# Patient Record
Sex: Female | Born: 1940 | Race: White | Hispanic: No | Smoking: Former smoker
Health system: Southern US, Community
[De-identification: ages and names within clinical notes are randomized; demographics above are authoritative.]

## PROBLEM LIST (undated history)

## (undated) DIAGNOSIS — I739 Peripheral vascular disease, unspecified: Secondary | ICD-10-CM

## (undated) DIAGNOSIS — F32A Depression, unspecified: Secondary | ICD-10-CM

## (undated) DIAGNOSIS — I639 Cerebral infarction, unspecified: Secondary | ICD-10-CM

## (undated) DIAGNOSIS — I2510 Atherosclerotic heart disease of native coronary artery without angina pectoris: Secondary | ICD-10-CM

## (undated) DIAGNOSIS — M549 Dorsalgia, unspecified: Secondary | ICD-10-CM

## (undated) DIAGNOSIS — T7840XA Allergy, unspecified, initial encounter: Secondary | ICD-10-CM

## (undated) DIAGNOSIS — E079 Disorder of thyroid, unspecified: Secondary | ICD-10-CM

## (undated) DIAGNOSIS — I10 Essential (primary) hypertension: Secondary | ICD-10-CM

## (undated) DIAGNOSIS — F419 Anxiety disorder, unspecified: Secondary | ICD-10-CM

## (undated) DIAGNOSIS — D649 Anemia, unspecified: Secondary | ICD-10-CM

## (undated) DIAGNOSIS — M1990 Unspecified osteoarthritis, unspecified site: Secondary | ICD-10-CM

## (undated) HISTORY — DX: Allergy, unspecified, initial encounter: T78.40XA

## (undated) HISTORY — DX: Unspecified osteoarthritis, unspecified site: M19.90

## (undated) HISTORY — DX: Anemia, unspecified: D64.9

## (undated) HISTORY — PX: CORONARY ARTERY BYPASS GRAFT: SHX141

## (undated) HISTORY — DX: Disorder of thyroid, unspecified: E07.9

## (undated) HISTORY — PX: CARDIAC CATHETERIZATION: SHX172

## (undated) HISTORY — PX: OTHER SURGICAL HISTORY: SHX169

## (undated) HISTORY — DX: Anxiety disorder, unspecified: F41.9

## (undated) HISTORY — DX: Essential (primary) hypertension: I10

## (undated) HISTORY — DX: Dorsalgia, unspecified: M54.9

## (undated) HISTORY — PX: CATARACT EXTRACTION: SUR2

## (undated) HISTORY — PX: CORONARY ANGIOPLASTY: SHX604

---

## 2020-10-02 DIAGNOSIS — F32A Depression, unspecified: Secondary | ICD-10-CM | POA: Diagnosis present

## 2020-10-02 DIAGNOSIS — Z8673 Personal history of transient ischemic attack (TIA), and cerebral infarction without residual deficits: Secondary | ICD-10-CM

## 2020-10-02 DIAGNOSIS — Z87891 Personal history of nicotine dependence: Secondary | ICD-10-CM | POA: Insufficient documentation

## 2020-10-02 DIAGNOSIS — I2510 Atherosclerotic heart disease of native coronary artery without angina pectoris: Secondary | ICD-10-CM

## 2020-10-02 DIAGNOSIS — E785 Hyperlipidemia, unspecified: Secondary | ICD-10-CM | POA: Insufficient documentation

## 2020-10-02 DIAGNOSIS — E039 Hypothyroidism, unspecified: Secondary | ICD-10-CM | POA: Diagnosis present

## 2020-10-02 DIAGNOSIS — Z951 Presence of aortocoronary bypass graft: Secondary | ICD-10-CM | POA: Insufficient documentation

## 2020-10-02 DIAGNOSIS — D649 Anemia, unspecified: Secondary | ICD-10-CM | POA: Insufficient documentation

## 2020-10-02 DIAGNOSIS — G8929 Other chronic pain: Secondary | ICD-10-CM | POA: Insufficient documentation

## 2020-10-02 DIAGNOSIS — Z7901 Long term (current) use of anticoagulants: Secondary | ICD-10-CM | POA: Insufficient documentation

## 2020-10-02 DIAGNOSIS — M549 Dorsalgia, unspecified: Secondary | ICD-10-CM | POA: Insufficient documentation

## 2020-10-02 DIAGNOSIS — Z955 Presence of coronary angioplasty implant and graft: Secondary | ICD-10-CM | POA: Insufficient documentation

## 2020-10-02 DIAGNOSIS — E539 Vitamin B deficiency, unspecified: Secondary | ICD-10-CM | POA: Insufficient documentation

## 2020-10-02 HISTORY — DX: Personal history of transient ischemic attack (TIA), and cerebral infarction without residual deficits: Z86.73

## 2020-10-02 HISTORY — DX: Personal history of nicotine dependence: Z87.891

## 2020-10-06 ENCOUNTER — Emergency Department (HOSPITAL_COMMUNITY)
Admission: EM | Admit: 2020-10-06 | Discharge: 2020-10-06 | Disposition: A | Discharge status: Home / Self Care | Payer: Medicare Other | Source: Home / Self Care | Attending: Emergency Medicine | Admitting: Emergency Medicine

## 2020-10-06 ENCOUNTER — Other Ambulatory Visit: Payer: Self-pay

## 2020-10-06 ENCOUNTER — Inpatient Hospital Stay (HOSPITAL_COMMUNITY)
Admission: EM | Admit: 2020-10-06 | Discharge: 2020-10-12 | DRG: 091 | Disposition: A | Discharge status: Skilled Nursing Facility | Payer: Medicare Other | Attending: Internal Medicine | Admitting: Internal Medicine

## 2020-10-06 ENCOUNTER — Observation Stay (HOSPITAL_COMMUNITY): Payer: Medicare Other

## 2020-10-06 ENCOUNTER — Emergency Department (HOSPITAL_COMMUNITY): Payer: Medicare Other

## 2020-10-06 ENCOUNTER — Encounter (HOSPITAL_COMMUNITY): Payer: Self-pay | Admitting: Internal Medicine

## 2020-10-06 DIAGNOSIS — W19XXXA Unspecified fall, initial encounter: Secondary | ICD-10-CM

## 2020-10-06 DIAGNOSIS — R4189 Other symptoms and signs involving cognitive functions and awareness: Secondary | ICD-10-CM | POA: Diagnosis present

## 2020-10-06 DIAGNOSIS — R059 Cough, unspecified: Secondary | ICD-10-CM

## 2020-10-06 DIAGNOSIS — E039 Hypothyroidism, unspecified: Secondary | ICD-10-CM | POA: Diagnosis present

## 2020-10-06 DIAGNOSIS — I10 Essential (primary) hypertension: Secondary | ICD-10-CM | POA: Diagnosis present

## 2020-10-06 DIAGNOSIS — R410 Disorientation, unspecified: Principal | ICD-10-CM

## 2020-10-06 DIAGNOSIS — E871 Hypo-osmolality and hyponatremia: Secondary | ICD-10-CM | POA: Diagnosis present

## 2020-10-06 DIAGNOSIS — G9340 Encephalopathy, unspecified: Secondary | ICD-10-CM | POA: Diagnosis not present

## 2020-10-06 DIAGNOSIS — F32A Depression, unspecified: Secondary | ICD-10-CM | POA: Diagnosis present

## 2020-10-06 DIAGNOSIS — Z951 Presence of aortocoronary bypass graft: Secondary | ICD-10-CM

## 2020-10-06 DIAGNOSIS — R52 Pain, unspecified: Secondary | ICD-10-CM

## 2020-10-06 DIAGNOSIS — T402X5A Adverse effect of other opioids, initial encounter: Secondary | ICD-10-CM | POA: Diagnosis present

## 2020-10-06 DIAGNOSIS — E785 Hyperlipidemia, unspecified: Secondary | ICD-10-CM | POA: Diagnosis present

## 2020-10-06 DIAGNOSIS — K5900 Constipation, unspecified: Secondary | ICD-10-CM | POA: Diagnosis present

## 2020-10-06 DIAGNOSIS — T424X5A Adverse effect of benzodiazepines, initial encounter: Secondary | ICD-10-CM | POA: Diagnosis present

## 2020-10-06 DIAGNOSIS — I639 Cerebral infarction, unspecified: Secondary | ICD-10-CM | POA: Diagnosis present

## 2020-10-06 DIAGNOSIS — Z803 Family history of malignant neoplasm of breast: Secondary | ICD-10-CM

## 2020-10-06 DIAGNOSIS — Z7901 Long term (current) use of anticoagulants: Secondary | ICD-10-CM

## 2020-10-06 DIAGNOSIS — Z9861 Coronary angioplasty status: Secondary | ICD-10-CM

## 2020-10-06 DIAGNOSIS — G9341 Metabolic encephalopathy: Secondary | ICD-10-CM | POA: Diagnosis present

## 2020-10-06 DIAGNOSIS — R296 Repeated falls: Secondary | ICD-10-CM | POA: Diagnosis present

## 2020-10-06 DIAGNOSIS — I739 Peripheral vascular disease, unspecified: Secondary | ICD-10-CM | POA: Diagnosis present

## 2020-10-06 DIAGNOSIS — T50905A Adverse effect of unspecified drugs, medicaments and biological substances, initial encounter: Secondary | ICD-10-CM

## 2020-10-06 DIAGNOSIS — G928 Other toxic encephalopathy: Principal | ICD-10-CM | POA: Diagnosis present

## 2020-10-06 DIAGNOSIS — I4892 Unspecified atrial flutter: Secondary | ICD-10-CM | POA: Diagnosis present

## 2020-10-06 DIAGNOSIS — R627 Adult failure to thrive: Secondary | ICD-10-CM | POA: Diagnosis present

## 2020-10-06 DIAGNOSIS — Z20822 Contact with and (suspected) exposure to covid-19: Secondary | ICD-10-CM | POA: Diagnosis present

## 2020-10-06 DIAGNOSIS — Z79890 Hormone replacement therapy (postmenopausal): Secondary | ICD-10-CM

## 2020-10-06 DIAGNOSIS — Z87891 Personal history of nicotine dependence: Secondary | ICD-10-CM

## 2020-10-06 DIAGNOSIS — R531 Weakness: Secondary | ICD-10-CM | POA: Diagnosis present

## 2020-10-06 DIAGNOSIS — I480 Paroxysmal atrial fibrillation: Secondary | ICD-10-CM | POA: Diagnosis present

## 2020-10-06 DIAGNOSIS — Z7982 Long term (current) use of aspirin: Secondary | ICD-10-CM

## 2020-10-06 DIAGNOSIS — Z79899 Other long term (current) drug therapy: Secondary | ICD-10-CM

## 2020-10-06 DIAGNOSIS — Z8673 Personal history of transient ischemic attack (TIA), and cerebral infarction without residual deficits: Secondary | ICD-10-CM

## 2020-10-06 DIAGNOSIS — F419 Anxiety disorder, unspecified: Secondary | ICD-10-CM | POA: Diagnosis present

## 2020-10-06 DIAGNOSIS — Z9181 History of falling: Secondary | ICD-10-CM

## 2020-10-06 DIAGNOSIS — R7303 Prediabetes: Secondary | ICD-10-CM | POA: Diagnosis present

## 2020-10-06 DIAGNOSIS — I2510 Atherosclerotic heart disease of native coronary artery without angina pectoris: Secondary | ICD-10-CM

## 2020-10-06 DIAGNOSIS — F319 Bipolar disorder, unspecified: Secondary | ICD-10-CM | POA: Diagnosis present

## 2020-10-06 DIAGNOSIS — R946 Abnormal results of thyroid function studies: Secondary | ICD-10-CM | POA: Diagnosis present

## 2020-10-06 DIAGNOSIS — T381X6A Underdosing of thyroid hormones and substitutes, initial encounter: Secondary | ICD-10-CM | POA: Diagnosis present

## 2020-10-06 DIAGNOSIS — Z8249 Family history of ischemic heart disease and other diseases of the circulatory system: Secondary | ICD-10-CM

## 2020-10-06 DIAGNOSIS — Z91138 Patient's unintentional underdosing of medication regimen for other reason: Secondary | ICD-10-CM

## 2020-10-06 DIAGNOSIS — R739 Hyperglycemia, unspecified: Secondary | ICD-10-CM | POA: Diagnosis present

## 2020-10-06 DIAGNOSIS — R58 Hemorrhage, not elsewhere classified: Secondary | ICD-10-CM | POA: Insufficient documentation

## 2020-10-06 HISTORY — DX: Peripheral vascular disease, unspecified: I73.9

## 2020-10-06 HISTORY — DX: Depression, unspecified: F32.A

## 2020-10-06 HISTORY — DX: Atherosclerotic heart disease of native coronary artery without angina pectoris: I25.10

## 2020-10-06 HISTORY — DX: Cerebral infarction, unspecified: I63.9

## 2020-10-06 LAB — COMPREHENSIVE METABOLIC PANEL
ALT: 14 U/L (ref 0–44)
AST: 17 U/L (ref 15–41)
Albumin: 3.8 g/dL (ref 3.5–5.0)
Alkaline Phosphatase: 76 U/L (ref 38–126)
Anion gap: 10 (ref 5–15)
BUN: 8 mg/dL (ref 8–23)
CO2: 20 mmol/L — ABNORMAL LOW (ref 22–32)
Calcium: 9.5 mg/dL (ref 8.9–10.3)
Chloride: 105 mmol/L (ref 98–111)
Creatinine, Ser: 0.82 mg/dL (ref 0.44–1.00)
GFR, Estimated: >60 mL/min (ref >60)
Glucose, Bld: 103 mg/dL — ABNORMAL HIGH (ref 70–99)
Potassium: 4.5 mmol/L (ref 3.5–5.1)
Sodium: 135 mmol/L (ref 135–145)
Total Bilirubin: 1.1 mg/dL (ref 0.3–1.2)
Total Protein: 7.3 g/dL (ref 6.5–8.1)

## 2020-10-06 LAB — DIFFERENTIAL
Abs Immature Granulocytes: 0.06 10*3/uL (ref 0.00–0.07)
Basophils Absolute: 0.1 10*3/uL (ref 0.0–0.1)
Basophils Relative: 1 %
Eosinophils Absolute: 0.3 10*3/uL (ref 0.0–0.5)
Eosinophils Relative: 3 %
Immature Granulocytes: 1 %
Lymphocytes Relative: 17 %
Lymphs Abs: 1.7 10*3/uL (ref 0.7–4.0)
Monocytes Absolute: 0.9 10*3/uL (ref 0.1–1.0)
Monocytes Relative: 9 %
Neutro Abs: 6.8 10*3/uL (ref 1.7–7.7)
Neutrophils Relative %: 69 %

## 2020-10-06 LAB — LITHIUM LEVEL: Lithium Lvl: 0.9 mmol/L (ref 0.60–1.20)

## 2020-10-06 LAB — I-STAT CHEM 8, ED
BUN: 8 mg/dL (ref 8–23)
Calcium, Ion: 1.25 mmol/L (ref 1.15–1.40)
Chloride: 105 mmol/L (ref 98–111)
Creatinine, Ser: 0.7 mg/dL (ref 0.44–1.00)
Glucose, Bld: 99 mg/dL (ref 70–99)
HCT: 39 % (ref 36.0–46.0)
Hemoglobin: 13.3 g/dL (ref 12.0–15.0)
Potassium: 4.6 mmol/L (ref 3.5–5.1)
Sodium: 138 mmol/L (ref 135–145)
TCO2: 23 mmol/L (ref 22–32)

## 2020-10-06 LAB — CBC
HCT: 39.5 % (ref 36.0–46.0)
Hemoglobin: 12.3 g/dL (ref 12.0–15.0)
MCH: 28.3 pg (ref 26.0–34.0)
MCHC: 31.1 g/dL (ref 30.0–36.0)
MCV: 91 fL (ref 80.0–100.0)
Platelets: 288 10*3/uL (ref 150–400)
RBC: 4.34 MIL/uL (ref 3.87–5.11)
RDW: 16 % — ABNORMAL HIGH (ref 11.5–15.5)
WBC: 9.8 10*3/uL (ref 4.0–10.5)
nRBC: 0 % (ref 0.0–0.2)

## 2020-10-06 LAB — HEPATIC FUNCTION PANEL
ALT: 14 U/L (ref 0–44)
AST: 15 U/L (ref 15–41)
Albumin: 3.3 g/dL — ABNORMAL LOW (ref 3.5–5.0)
Alkaline Phosphatase: 68 U/L (ref 38–126)
Bilirubin, Direct: 0.1 mg/dL (ref 0.0–0.2)
Indirect Bilirubin: 0.7 mg/dL (ref 0.3–0.9)
Total Bilirubin: 0.8 mg/dL (ref 0.3–1.2)
Total Protein: 6.1 g/dL — ABNORMAL LOW (ref 6.5–8.1)

## 2020-10-06 LAB — CBG MONITORING, ED: Glucose-Capillary: 93 mg/dL (ref 70–99)

## 2020-10-06 LAB — CBC WITH DIFFERENTIAL/PLATELET
Abs Immature Granulocytes: 0.04 10*3/uL (ref 0.00–0.07)
Basophils Absolute: 0.1 10*3/uL (ref 0.0–0.1)
Basophils Relative: 1 %
Eosinophils Absolute: 0.3 10*3/uL (ref 0.0–0.5)
Eosinophils Relative: 3 %
HCT: 32.4 % — ABNORMAL LOW (ref 36.0–46.0)
Hemoglobin: 10.8 g/dL — ABNORMAL LOW (ref 12.0–15.0)
Immature Granulocytes: 0 %
Lymphocytes Relative: 19 %
Lymphs Abs: 1.8 10*3/uL (ref 0.7–4.0)
MCH: 29.6 pg (ref 26.0–34.0)
MCHC: 33.3 g/dL (ref 30.0–36.0)
MCV: 88.8 fL (ref 80.0–100.0)
Monocytes Absolute: 1 10*3/uL (ref 0.1–1.0)
Monocytes Relative: 11 %
Neutro Abs: 6.1 10*3/uL (ref 1.7–7.7)
Neutrophils Relative %: 66 %
Platelets: 256 10*3/uL (ref 150–400)
RBC: 3.65 MIL/uL — ABNORMAL LOW (ref 3.87–5.11)
RDW: 15.9 % — ABNORMAL HIGH (ref 11.5–15.5)
WBC: 9.2 10*3/uL (ref 4.0–10.5)
nRBC: 0 % (ref 0.0–0.2)

## 2020-10-06 LAB — BASIC METABOLIC PANEL
Anion gap: 8 (ref 5–15)
BUN: 9 mg/dL (ref 8–23)
CO2: 21 mmol/L — ABNORMAL LOW (ref 22–32)
Calcium: 8.4 mg/dL — ABNORMAL LOW (ref 8.9–10.3)
Chloride: 99 mmol/L (ref 98–111)
Creatinine, Ser: 0.74 mg/dL (ref 0.44–1.00)
GFR, Estimated: >60 mL/min (ref >60)
Glucose, Bld: 135 mg/dL — ABNORMAL HIGH (ref 70–99)
Potassium: 3.6 mmol/L (ref 3.5–5.1)
Sodium: 128 mmol/L — ABNORMAL LOW (ref 135–145)

## 2020-10-06 LAB — RESP PANEL BY RT-PCR (FLU A&B, COVID) ARPGX2
Influenza A by PCR: NEGATIVE
Influenza B by PCR: NEGATIVE
SARS Coronavirus 2 by RT PCR: NEGATIVE

## 2020-10-06 LAB — PROTIME-INR
INR: 1.2 (ref 0.8–1.2)
Prothrombin Time: 14.6 seconds (ref 11.4–15.2)

## 2020-10-06 LAB — TSH: TSH: 10.765 u[IU]/mL — ABNORMAL HIGH (ref 0.350–4.500)

## 2020-10-06 LAB — APTT: aPTT: 28 seconds (ref 24–36)

## 2020-10-06 LAB — AMMONIA: Ammonia: 19 umol/L (ref 9–35)

## 2020-10-06 MED ORDER — LORAZEPAM 2 MG/ML IJ SOLN
1.0000 mg | Freq: Once | INTRAMUSCULAR | Status: AC
  Administered 2020-10-06: 1 mg via INTRAVENOUS
  Filled 2020-10-06: qty 1

## 2020-10-06 MED ORDER — LACTATED RINGERS IV BOLUS: 1000.0000 mL | Freq: Once | INTRAVENOUS | Status: DC

## 2020-10-06 MED ORDER — LEVOTHYROXINE SODIUM 112 MCG PO TABS
112.0000 ug | ORAL_TABLET | Freq: Every day | ORAL | Status: DC
  Administered 2020-10-07: 112 ug via ORAL
  Filled 2020-10-06: qty 1

## 2020-10-06 MED ORDER — DOCUSATE SODIUM 100 MG PO CAPS: 300.0000 mg | ORAL_CAPSULE | Freq: Every day | ORAL | Status: DC | PRN

## 2020-10-06 MED ORDER — CILOSTAZOL 100 MG PO TABS
100.0000 mg | ORAL_TABLET | Freq: Two times a day (BID) | ORAL | Status: DC
  Administered 2020-10-07 – 2020-10-12 (×12): 100 mg via ORAL
  Filled 2020-10-06 (×14): qty 1

## 2020-10-06 MED ORDER — CLONAZEPAM 0.5 MG PO TABS
0.5000 mg | ORAL_TABLET | Freq: Every day | ORAL | Status: DC
  Administered 2020-10-07 – 2020-10-09 (×3): 0.5 mg via ORAL
  Filled 2020-10-06 (×4): qty 1

## 2020-10-06 MED ORDER — ASPIRIN EC 81 MG PO TBEC
81.0000 mg | DELAYED_RELEASE_TABLET | Freq: Every day | ORAL | Status: DC
  Filled 2020-10-06: qty 1

## 2020-10-06 MED ORDER — METOPROLOL SUCCINATE ER 100 MG PO TB24
100.0000 mg | ORAL_TABLET | Freq: Every day | ORAL | Status: DC
  Administered 2020-10-07 – 2020-10-11 (×6): 100 mg via ORAL
  Filled 2020-10-06 (×6): qty 1

## 2020-10-06 MED ORDER — SODIUM CHLORIDE 0.9% FLUSH: 3.0000 mL | Freq: Once | INTRAVENOUS | Status: DC

## 2020-10-06 MED ORDER — APIXABAN 2.5 MG PO TABS
2.5000 mg | ORAL_TABLET | Freq: Two times a day (BID) | ORAL | Status: DC
  Administered 2020-10-07: 2.5 mg via ORAL
  Filled 2020-10-06: qty 1

## 2020-10-06 MED ORDER — BENAZEPRIL HCL 20 MG PO TABS
20.0000 mg | ORAL_TABLET | Freq: Every day | ORAL | Status: DC
  Administered 2020-10-07 – 2020-10-12 (×6): 20 mg via ORAL
  Filled 2020-10-06 (×6): qty 1

## 2020-10-06 MED ORDER — AMLODIPINE BESYLATE 10 MG PO TABS
10.0000 mg | ORAL_TABLET | Freq: Every day | ORAL | Status: DC
  Administered 2020-10-07 – 2020-10-12 (×6): 10 mg via ORAL
  Filled 2020-10-06 (×6): qty 1

## 2020-10-06 MED ORDER — AMLODIPINE BESY-BENAZEPRIL HCL 10-20 MG PO CAPS: 1.0000 | ORAL_CAPSULE | Freq: Every day | ORAL | Status: DC

## 2020-10-06 MED ORDER — LITHIUM CARBONATE 300 MG PO CAPS
300.0000 mg | ORAL_CAPSULE | Freq: Every day | ORAL | Status: DC
  Administered 2020-10-07 – 2020-10-11 (×6): 300 mg via ORAL
  Filled 2020-10-06 (×7): qty 1

## 2020-10-06 MED ORDER — HYDROCODONE-ACETAMINOPHEN 5-325 MG PO TABS
1.0000 | ORAL_TABLET | Freq: Four times a day (QID) | ORAL | Status: DC | PRN
  Administered 2020-10-07: 1 via ORAL
  Filled 2020-10-06: qty 1

## 2020-10-06 MED ORDER — ATORVASTATIN CALCIUM 10 MG PO TABS
20.0000 mg | ORAL_TABLET | Freq: Every day | ORAL | Status: DC
  Administered 2020-10-07 – 2020-10-11 (×6): 20 mg via ORAL
  Filled 2020-10-06 (×6): qty 2

## 2020-10-06 MED ORDER — LACTATED RINGERS IV BOLUS
1000.0000 mL | Freq: Once | INTRAVENOUS | Status: AC
  Administered 2020-10-06: 1000 mL via INTRAVENOUS

## 2020-10-06 NOTE — H&P (Addendum)
History and Physical    Carmen [name] QQI:[id number] DOB: January 28, 1941 DOA: 10/06/2020  PCP: Patient, No Pcp Per  Patient coming from: Home.  History obtained from patient's daughter.  Chief Complaint: Confusion and frequent falls.  HPI: Carmen [name] is a 80 y.o. female with history of stroke in September 2021 when patient was in [place] subsequently which patient moved to her daughter's house in [place] and was found to have increasing falls over the last couple of weeks.  Patient's daughter in [place] brought her over here to take care of the patient.  Is not sure if patient was taking her home medications in the right way when patient was in [facility] because she took it herself.  Over the last 4 days patient's daughter in [place] was taking care of her and giving the medications.  Over the last 24 hours patient has become more confused and was finding it difficult to ambulate.  Was brought to the ER.  Did not have any chest pain productive cough fever chills nausea vomiting or diarrhea.  Had some constipation 5 days ago which improved with stool softeners.  ED Course: In the ER patient is alert and awake oriented to her name and time.  Moving all extremities.  MRI brain does not show anything acute.  Monitor does show atrial flutter rate controlled at around 80 bpm.  Initial labs show sodium of 128.  Hemoglobin of 10.  Lithium levels are normal.  Salicylates and Tylenol levels are pending.  TSH is around 10 and patient's daughter states that not sure if she was taking her Synthroid right away.  Patient admitted for generalized weakness change in mental status and frequent falls.  Review of Systems: As per HPI, rest all negative.   Past Medical History:  Diagnosis Date  . Coronary artery disease   . Depression   . Peripheral vascular disease (HCC)   . Stroke [facility])     Past Surgical History:  Procedure Laterality Date  . CARDIAC CATHETERIZATION    .  CORONARY ANGIOPLASTY    . CORONARY ARTERY BYPASS GRAFT       reports that she has been smoking. She has never used smokeless tobacco. She reports previous alcohol use. No history on file for drug use.  No Known Allergies  Family History  Problem Relation Age of Onset  . CAD Mother   . Breast cancer Mother   . Bipolar disorder Daughter   . Diabetes Mellitus II Neg Hx     Prior to Admission medications   Medication Sig Start Date End Date Taking? Authorizing Provider  amLODipine-benazepril (LOTREL) 10-20 MG capsule Take 1 capsule by mouth daily.   Yes [provider]  apixaban (ELIQUIS) 5 MG TABS tablet Take 2.5 mg by mouth 2 (two) times daily.   Yes [provider]  aspirin EC 81 MG tablet Take 40.5 mg by mouth daily as needed for moderate pain. Swallow whole.   Yes [provider]  atorvastatin (LIPITOR) 20 MG tablet Take 20 mg by mouth at bedtime.   Yes [provider]  cilostazol (PLETAL) 100 MG tablet Take 100 mg by mouth 2 (two) times daily.   Yes [provider]  clonazePAM (KLONOPIN) 0.5 MG tablet Take 0.5 mg by mouth daily.   Yes [provider]  docusate sodium (COLACE) 100 MG capsule Take 300 mg by mouth daily as needed for mild constipation.   Yes [provider]  HYDROcodone-acetaminophen (NORCO) 10-325 MG  tablet Take 1 tablet by mouth 3 (three) times daily as needed for moderate pain.   Yes [provider]  levothyroxine (SYNTHROID) 112 MCG tablet Take 112 mcg by mouth daily before breakfast.   Yes [provider]  lithium carbonate 300 MG capsule Take 300 mg by mouth at bedtime.   Yes [provider]  metoprolol succinate (TOPROL-XL) 100 MG 24 hr tablet Take 100 mg by mouth at bedtime. Take with or immediately following a meal.   Yes [provider]    Physical Exam: Constitutional: Moderately built and nourished. Vitals:   10/06/20 2100 10/06/20 2115 10/06/20 2200  10/06/20 2215  BP: 139/76 (!) 137/97 138/72 115/65  Pulse: 82 82 71 68  Resp: 12 16 17 17   Temp:      TempSrc:      SpO2: 99% 99% 100% 96%  Weight:      Height:       Eyes: Anicteric no pallor. ENMT: No discharge from the ears eyes nose or mouth. Neck: No mass felt.  No neck rigidity. Respiratory: No rhonchi or crepitations. Cardiovascular: S1-S2 heard. Abdomen: Soft nontender bowel sounds present. Musculoskeletal: Multiple bruises particular on the right upper extremity. Skin: Multiple bruises. Neurologic: Alert awake oriented to name and time moving all extremities.  Pupils equal and reacting to light. Psychiatric: Oriented to her name and time.   Labs on Admission: I have personally reviewed following labs and imaging studies  CBC: Recent Labs  Lab 10/06/20 2202  WBC 9.2  NEUTROABS 6.1  HGB 10.8*  HCT 32.4*  MCV 88.8  PLT 256   Basic Metabolic Panel: Recent Labs  Lab 10/06/20 2202  NA 128*  K 3.6  CL 99  CO2 21*  GLUCOSE 135*  BUN 9  CREATININE 0.74  CALCIUM 8.4*   GFR: Estimated Creatinine Clearance: 62 mL/min (by C-G formula based on SCr of 0.74 mg/dL). Liver Function Tests: Recent Labs  Lab 10/06/20 2202  AST 15  ALT 14  ALKPHOS 68  BILITOT 0.8  PROT 6.1*  ALBUMIN 3.3*   No results for input(s): LIPASE, AMYLASE in the last 168 hours. Recent Labs  Lab 10/06/20 1651  AMMONIA 19   Coagulation Profile: No results for input(s): INR, PROTIME in the last 168 hours. Cardiac Enzymes: No results for input(s): CKTOTAL, CKMB, CKMBINDEX, TROPONINI in the last 168 hours. BNP (last 3 results) No results for input(s): PROBNP in the last 8760 hours. HbA1C: No results for input(s): HGBA1C in the last 72 hours. CBG: No results for input(s): GLUCAP in the last 168 hours. Lipid Profile: No results for input(s): CHOL, HDL, LDLCALC, TRIG, CHOLHDL, LDLDIRECT in the last 72 hours. Thyroid Function Tests: Recent Labs    10/06/20 1651  TSH 10.765*    Anemia Panel: No results for input(s): VITAMINB12, FOLATE, FERRITIN, TIBC, IRON, RETICCTPCT in the last 72 hours. Urine analysis: No results found for: COLORURINE, APPEARANCEUR, LABSPEC, PHURINE, GLUCOSEU, HGBUR, BILIRUBINUR, KETONESUR, PROTEINUR, UROBILINOGEN, NITRITE, LEUKOCYTESUR Sepsis Labs: @LABRCNTIP (procalcitonin:4,lacticidven:4) ) Recent Results (from the past 240 hour(s))  Resp Panel by RT-PCR (Flu A&B, Covid) Nasopharyngeal Swab     Status: None   Collection Time: 10/06/20  4:52 PM   Specimen: Nasopharyngeal Swab; Nasopharyngeal(NP) swabs in vial transport medium  Result Value Ref Range Status   SARS Coronavirus 2 by RT PCR NEGATIVE NEGATIVE Final    Comment: (NOTE) SARS-CoV-2 target nucleic acids are NOT DETECTED.  The SARS-CoV-2 RNA is generally detectable in upper respiratory specimens during the acute phase  of infection. The lowest concentration of SARS-CoV-2 viral copies this assay can detect is 138 copies/mL. A negative result does not preclude SARS-Cov-2 infection and should not be used as the sole basis for treatment or other patient management decisions. A negative result may occur with  improper specimen collection/handling, submission of specimen other than nasopharyngeal swab, presence of viral mutation(s) within the areas targeted by this assay, and inadequate number of viral copies(<138 copies/mL). A negative result must be combined with clinical observations, patient history, and epidemiological information. The expected result is Negative.  Fact Sheet for Patients:  BloggerCourse.com  Fact Sheet for Healthcare Providers:  SeriousBroker.it  This test is no t yet approved or cleared by the [place] FDA and  has been authorized for detection and/or diagnosis of SARS-CoV-2 by FDA under an Emergency Use Authorization (EUA). This EUA will remain  in effect (meaning this test can be used) for the  duration of the COVID-19 declaration under Section 564(b)(1) of the Act, 21 U.S.C.section 360bbb-3(b)(1), unless the authorization is terminated  or revoked sooner.       Influenza A by PCR NEGATIVE NEGATIVE Final   Influenza B by PCR NEGATIVE NEGATIVE Final    Comment: (NOTE) The Xpert Xpress SARS-CoV-2/FLU/RSV plus assay is intended as an aid in the diagnosis of influenza from Nasopharyngeal swab specimens and should not be used as a sole basis for treatment. Nasal washings and aspirates are unacceptable for Xpert Xpress SARS-CoV-2/FLU/RSV testing.  Fact Sheet for Patients: BloggerCourse.com  Fact Sheet for Healthcare Providers: SeriousBroker.it  This test is not yet approved or cleared by the [place] FDA and has been authorized for detection and/or diagnosis of SARS-CoV-2 by FDA under an Emergency Use Authorization (EUA). This EUA will remain in effect (meaning this test can be used) for the duration of the COVID-19 declaration under Section 564(b)(1) of the Act, 21 U.S.C. section 360bbb-3(b)(1), unless the authorization is terminated or revoked.  Performed at [facility], [address] [place], [place] [address]      Radiological Exams on Admission: MR BRAIN WO CONTRAST  Result Date: 10/06/2020 CLINICAL DATA:  Initial evaluation for acute dizziness, mental status change. EXAM: MRI HEAD WITHOUT CONTRAST TECHNIQUE: Multiplanar, multiecho pulse sequences of the brain and surrounding structures were obtained without intravenous contrast. COMPARISON:  Level none available. FINDINGS: Brain: Examination severely limited as the patient was unable to tolerate the full length of the exam. Axial coronal DWI sequences only were performed. Additionally, images provided are markedly degraded by motion artifact. Diffusion-weighted imaging demonstrates no evidence for acute or subacute ischemia. Areas of encephalomalacia  involving the parasagittal right frontal lobe and left cerebellum likely reflect remote infarcts, incompletely assessed on this limited exam. No definite mass lesion, mass effect, or midline shift. No hydrocephalus or visible extra-axial fluid collection. Vascular: Not assessed on this technically limited exam. Skull and upper cervical spine: Not assessed on this technically limited exam. Sinuses/Orbits: Not assessed on this technically limited exam. Other: Negative. IMPRESSION: 1. Technically limited exam due to the patient's inability to tolerate the full length of the study as well as extensive motion artifact. Diffusion-weighted sequences only were performed. No evidence for acute or subacute ischemia. 2. No other definite acute intracranial abnormality. 3. 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 Electronically Signed   By: Rise Mu M.D.   On: 10/06/2020 19:11     Assessment/Plan Principal Problem:   Acute encephalopathy Active Problems:  CVA (cerebral vascular accident) ([place])   Atrial flutter ([place])   Depression   PVD (peripheral vascular disease) ([place])   Hypothyroidism   CAD (coronary artery disease)    1. Acute encephalopathy -MRI brain does not show anything acute.  Ammonia levels and lithium levels are normal.  Will check B12 and RPR EEG.  Could be from dehydration sodium was around 128.  Did receive fluid bolus in the ER.  Check metabolic panel. 2. Frequent falls could be from disequilibrium.  Get physical therapy consult.  Follow B12 levels.  CT C-spine pending. 3. Multiple bruises particular of the right upper extremity x-rays of the right upper extremity is pending. 4. Atrial flutter seen in the monitor rate controlled presently on apixaban and beta-blockers.  EKG is pending. 5. Hypothyroidism TSH is elevated at 10.  Not sure if was taking her medications as advised.  Presently on Synthroid. 6. History of  depression and anxiety on lithium and Klonopin.  Patient's daughter states she has been taking his medicines for many years. 7. History of peripheral vascular disease on Pletal. 8. History of stroke in September 2021.  Presently on apixaban. 9. Hypertension on benazepril amlodipine and beta-blockers.  Tylenol level salicylate levels are pending.  X-ray of the right upper extremity and CT C-spine are pending.   DVT prophylaxis: Apixaban. Code Status: Full code confirmed with patient's daughter. Family Communication: Patient's daughter. Disposition Plan: May need placement. Consults called: Physical therapy and social work. Admission status: Observation.   Rise Patience MD [facility] Pager [phone].  If 7PM-7AM, please contact night-coverage [email or web address] Password [facility]  10/06/2020, 10:54 PM

## 2020-10-06 NOTE — ED Triage Notes (Signed)
Arrived via EMS; from home. Reported stroke and sept, and is being care for by daughter at home. Reported non-compliant w/ meds and has some confusion this AM, lethargic and staring into space. EMS denies unilateral deficit. Patient stated she fell, unable to give details. RUE bruised and swelling,

## 2020-10-06 NOTE — ED Provider Notes (Signed)
[facility] EMERGENCY DEPARTMENT Provider Note   CSN: [id number] Arrival date & time: 10/06/20  1136     History Chief Complaint  Patient presents with   Altered Mental Status    Carmen [name] is a 80 y.o. female.  Patient is a 80 year old female with a prior history of hypertension, hyperlipidemia, prior stroke who is presenting today with her daughter due to confusion.  Patient's prior history is she had a stroke in September 2021 while she was living in [place] and after that she moved in with her daughter in [place].  Her daughter and son-in-law were just allowing her to take her own medications but she was needing more care than that which they did not feel that they were able to provide so her daughter who lives in [place] went and picked her up this week and drove her back to [place] to live with her.  Prior to her arrival she is not clear how her mom was taking the medications but since she has been with her the last 5 days she has been giving her her medications as prescribed.  She does report prior to leaving her sister's house last week her mom had 5 falls that they did not seek medical attention for.  She reported she did hit her head and she was feeling dizzy but daughter reported that she seemed her normal self.  They arrived in [place] on Sunday and she was at her baseline walking talking able to make herself something to eat if she needed it and going to the bathroom without assistance.  Since yesterday she reports that her mother has become increasingly confused and stopped eating and drinking.  She had to force feed her last night and she has been reluctant to take the medications but she has not gotten them all in her.  Today she was unable to stand on her own or walk to the bathroom which is a big change in things that she would ordinarily know she does not.  Patient reports she still having some pain in her arms and buttocks due to the  falls last week.  Daughter reports that in the last 5 days she has had a cough but has denied feeling short of breath and has not appeared winded.  She has not had any fever and denied any urinary symptoms.  She has not had a bowel movement for about 5 days and had been taking significant amount of stool softeners.  Daughter reports that last night she was complaining of abdominal pain however she denies any abdominal pain currently.  The history is provided by the patient and a caregiver.  Altered Mental Status Presenting symptoms: confusion        No past medical history on file.  There are no problems to display for this patient.    The histories are not reviewed yet. Please review them in the "History" navigator section and refresh this SmartLink.   OB History   No obstetric history on file.     No family history on file.     Home Medications Prior to Admission medications   Not on File    Allergies    Patient has no known allergies.  Review of Systems   Review of Systems  Psychiatric/Behavioral: Positive for confusion.  All other systems reviewed and are negative.   Physical Exam Updated Vital Signs BP 131/77 (BP Location: Right Arm)    Pulse 80  Temp 97.7 F (36.5 C) (Oral)    Resp 17    SpO2 98%   Physical Exam Vitals and nursing note reviewed.  Constitutional:      General: She is not in acute distress.    Appearance: Normal appearance. She is well-developed. She is obese.  HENT:     Head: Normocephalic and atraumatic.     Nose: Nose normal.     Mouth/Throat:     Mouth: Mucous membranes are moist.  Eyes:     Extraocular Movements: Extraocular movements intact.     Conjunctiva/sclera: Conjunctivae normal.     Pupils: Pupils are equal, round, and reactive to light.  Cardiovascular:     Rate and Rhythm: Normal rate and regular rhythm.     Pulses: Normal pulses.     Heart sounds: Normal heart sounds. No murmur heard. No friction rub.  Pulmonary:      Effort: Pulmonary effort is normal.     Breath sounds: Normal breath sounds. No wheezing or rales.  Abdominal:     General: Bowel sounds are normal. There is no distension.     Palpations: Abdomen is soft.     Tenderness: There is no abdominal tenderness. There is no guarding or rebound.  Musculoskeletal:        General: No tenderness. Normal range of motion.     Comments: No edema  Skin:    General: Skin is warm and dry.     Capillary Refill: Capillary refill takes less than 2 seconds.     Findings: No rash.     Comments: Multiple areas of ecchymosis over the buttocks, sacrum, right upper extremity in various stages of healing  Neurological:     Mental Status: She is alert.     Cranial Nerves: No cranial nerve deficit.     Comments: Oriented to person and place but slightly confused on time.  Is able to say who the president is and her age.  However when asking her more complex task she has difficulty following through.  Extreme difficulty with finger-to-nose testing but does appear accurate.  Unable to perform heel-to-shin because she cannot comprehend how to do it.  No pronator drift and 5 out of 5 strength in all 4 extremities.  Sensation is intact.  No notable clonus  Psychiatric:     Comments: Calm and cooperative but does report she is confused     ED Results / Procedures / Treatments   Labs (all labs ordered are listed, but only abnormal results are displayed) Labs Reviewed  CBC - Abnormal; Notable for the following components:      Result Value   RDW 16.0 (*)    All other components within normal limits  COMPREHENSIVE METABOLIC PANEL - Abnormal; Notable for the following components:   CO2 20 (*)    Glucose, Bld 103 (*)    All other components within normal limits  RESP PANEL BY RT-PCR (FLU A&B, COVID) ARPGX2  PROTIME-INR  APTT  DIFFERENTIAL  URINALYSIS, ROUTINE W REFLEX MICROSCOPIC  LITHIUM LEVEL  TSH  T4  AMMONIA  I-STAT CHEM 8, ED  CBG MONITORING, ED     EKG EKG Interpretation  Date/Time:  Wednesday October 06 2020 11:42:38 EST Ventricular Rate:  88 PR Interval:    QRS Duration: 66 QT Interval:  390 QTC Calculation: 471 R Axis:   78 Text Interpretation: Atrial flutter with variable A-V block Lateral infarct , age undetermined No previous tracing Confirmed by Gwyneth Sprout ([id number]) on 10/06/2020  4:02:51 PM   Radiology CT HEAD WO CONTRAST  Result Date: 10/06/2020 CLINICAL DATA:  Neuro deficit, acute stroke suspected EXAM: CT HEAD WITHOUT CONTRAST TECHNIQUE: Contiguous axial images were obtained from the base of the skull through the vertex without intravenous contrast. COMPARISON:  None. FINDINGS: Brain: No evidence of acute infarction, hemorrhage, extra-axial collection, ventriculomegaly, or mass effect. Old left cerebellar and right frontal lobe infarct with encephalomalacia. Generalized cerebral atrophy. Periventricular white matter low attenuation likely secondary to microangiopathy. Vascular: Cerebrovascular atherosclerotic calcifications are noted. Skull: Negative for fracture or focal lesion. Sinuses/Orbits: Visualized portions of the orbits are unremarkable. Visualized portions of the paranasal sinuses are unremarkable. Visualized portions of the mastoid air cells are unremarkable. Other: None. IMPRESSION: 1. No acute intracranial pathology. 2. Chronic microvascular disease and cerebral atrophy. 3. Old left cerebellar and right frontal lobe infarct with encephalomalacia. Electronically Signed   By: Elige Ko   On: 10/06/2020 15:04    Procedures Procedures (including critical care time)  Medications Ordered in ED Medications  sodium chloride flush (NS) 0.9 % injection 3 mL (has no administration in time range)  lactated ringers bolus 1,000 mL (has no administration in time range)    ED Course  I have reviewed the triage vital signs and the nursing notes.  Pertinent labs & imaging results that were available during my care  of the patient were reviewed by me and considered in my medical decision making (see chart for details).    MDM Rules/Calculators/A&P                          Patient presenting today with her daughter for altered mental status.  Patient has had significant decline in the last 2 days.  Unclear the source at this time however concern for possible medications as patient is taking lithium and before her daughter brought her home it is unclear how she was taking the medications.  She is also been taking hydrocodone and Klonopin which all could be mind altering.  Also concern for possible recurrent stroke his daughter reports she had not been taking her Eliquis appropriately and had recently had a stroke in September.  Head CT without acute findings but did find old left cerebellar and right frontal lobe infarcts with encephalomalacia.  No evidence of intracranial hemorrhage at this time.  Patient was complaining of abdominal pain last night and has not had a bowel movement however at this time she has no abdominal pain and does not appear to be in any discomfort.  She is having cognitive difficulty in complex task but no significant unilateral neuro deficits.  Patient normally can stand and walk however needs 2 person assist just to stand up from a wheelchair.  Chem-8, INR, PTT, CBC are all within normal limits.  EKG shows atrial flutter without old to compare but this is known and is why patient is on Eliquis. We will do an MRI to rule out new acute infarcts however also concerned that medication could be the issue versus thyroid issue.  Lithium level, thyroid, ua and ammonia are pending.  Patient has no alcohol use history.  Given poor oral intake in the last 48 hours patient given an IV fluid bolus and also will be tested for Covid.  She has received all 3 shots in her family has all been vaccinated.  8:29 PM MRI without acute findings but is limited.  Lithium level normal.  TSH is elevated at 10 and most  likely from her not being compliant with meds.  T4 pending.  Feel pt's symptoms may be related to medication.  However will admit for AMS and inability to walk.  Pt does not appear to have infectious process at this time.  COVID neg.  UA is still pending.  MDM Number of Diagnoses or Management Options   Amount and/or Complexity of Data Reviewed Clinical lab tests: ordered and reviewed Tests in the radiology section of CPT: ordered and reviewed Tests in the medicine section of CPT: ordered and reviewed Decide to obtain previous medical records or to obtain history from someone other than the patient: yes Obtain history from someone other than the patient: yes Review and summarize past medical records: yes Discuss the patient with other providers: yes Independent visualization of images, tracings, or specimens: yes  Risk of Complications, Morbidity, and/or Mortality Presenting problems: high Diagnostic procedures: moderate Management options: moderate  Patient Progress Patient progress: stable     Blanchie Dessert, MD 10/06/20 2031

## 2020-10-06 NOTE — ED Notes (Signed)
Patient transported to MRI 

## 2020-10-06 NOTE — ED Notes (Signed)
Pt currently fell asleep while talking with her

## 2020-10-06 NOTE — ED Notes (Signed)
Will Obtain Vitals when pt returns from CT

## 2020-10-06 NOTE — ED Notes (Signed)
Daughter states pt has had multiple falls  Recently, has become more confused also.

## 2020-10-06 NOTE — ED Triage Notes (Signed)
Arrived via EMS; from home. Reported stroke and sept, and is being care for by daughter at home. Reported non-compliant w/ meds and has some confusion this AM, lethargic and staring into space. EMS denies unilateral deficit. Patient stated she fell, unable to give details. RUE bruised and swelling

## 2020-10-06 NOTE — ED Notes (Signed)
Daughter states pt has had a hard time walking. Started yesterday.

## 2020-10-07 ENCOUNTER — Observation Stay (HOSPITAL_COMMUNITY): Payer: Medicare Other

## 2020-10-07 DIAGNOSIS — T424X5A Adverse effect of benzodiazepines, initial encounter: Secondary | ICD-10-CM | POA: Diagnosis present

## 2020-10-07 DIAGNOSIS — F419 Anxiety disorder, unspecified: Secondary | ICD-10-CM | POA: Diagnosis present

## 2020-10-07 DIAGNOSIS — F319 Bipolar disorder, unspecified: Secondary | ICD-10-CM | POA: Diagnosis present

## 2020-10-07 DIAGNOSIS — R7303 Prediabetes: Secondary | ICD-10-CM | POA: Diagnosis present

## 2020-10-07 DIAGNOSIS — E785 Hyperlipidemia, unspecified: Secondary | ICD-10-CM | POA: Diagnosis present

## 2020-10-07 DIAGNOSIS — Z20822 Contact with and (suspected) exposure to covid-19: Secondary | ICD-10-CM | POA: Diagnosis present

## 2020-10-07 DIAGNOSIS — Z79890 Hormone replacement therapy (postmenopausal): Secondary | ICD-10-CM | POA: Diagnosis not present

## 2020-10-07 DIAGNOSIS — Z8673 Personal history of transient ischemic attack (TIA), and cerebral infarction without residual deficits: Secondary | ICD-10-CM | POA: Diagnosis not present

## 2020-10-07 DIAGNOSIS — R531 Weakness: Secondary | ICD-10-CM | POA: Diagnosis present

## 2020-10-07 DIAGNOSIS — K5900 Constipation, unspecified: Secondary | ICD-10-CM | POA: Diagnosis present

## 2020-10-07 DIAGNOSIS — E039 Hypothyroidism, unspecified: Secondary | ICD-10-CM | POA: Diagnosis present

## 2020-10-07 DIAGNOSIS — I480 Paroxysmal atrial fibrillation: Secondary | ICD-10-CM | POA: Diagnosis present

## 2020-10-07 DIAGNOSIS — Z7901 Long term (current) use of anticoagulants: Secondary | ICD-10-CM | POA: Diagnosis not present

## 2020-10-07 DIAGNOSIS — I4892 Unspecified atrial flutter: Secondary | ICD-10-CM | POA: Diagnosis present

## 2020-10-07 DIAGNOSIS — G9341 Metabolic encephalopathy: Secondary | ICD-10-CM | POA: Diagnosis present

## 2020-10-07 DIAGNOSIS — I739 Peripheral vascular disease, unspecified: Secondary | ICD-10-CM | POA: Diagnosis present

## 2020-10-07 DIAGNOSIS — I10 Essential (primary) hypertension: Secondary | ICD-10-CM | POA: Diagnosis present

## 2020-10-07 DIAGNOSIS — I2510 Atherosclerotic heart disease of native coronary artery without angina pectoris: Secondary | ICD-10-CM | POA: Diagnosis present

## 2020-10-07 DIAGNOSIS — R627 Adult failure to thrive: Secondary | ICD-10-CM | POA: Diagnosis present

## 2020-10-07 DIAGNOSIS — T402X5A Adverse effect of other opioids, initial encounter: Secondary | ICD-10-CM | POA: Diagnosis present

## 2020-10-07 DIAGNOSIS — G928 Other toxic encephalopathy: Secondary | ICD-10-CM | POA: Diagnosis present

## 2020-10-07 DIAGNOSIS — E871 Hypo-osmolality and hyponatremia: Secondary | ICD-10-CM | POA: Diagnosis present

## 2020-10-07 DIAGNOSIS — Z79899 Other long term (current) drug therapy: Secondary | ICD-10-CM | POA: Diagnosis not present

## 2020-10-07 DIAGNOSIS — G9340 Encephalopathy, unspecified: Secondary | ICD-10-CM | POA: Diagnosis not present

## 2020-10-07 DIAGNOSIS — R296 Repeated falls: Secondary | ICD-10-CM | POA: Diagnosis present

## 2020-10-07 LAB — BASIC METABOLIC PANEL
Anion gap: 9 (ref 5–15)
BUN: 7 mg/dL — ABNORMAL LOW (ref 8–23)
CO2: 23 mmol/L (ref 22–32)
Calcium: 9 mg/dL (ref 8.9–10.3)
Chloride: 104 mmol/L (ref 98–111)
Creatinine, Ser: 0.7 mg/dL (ref 0.44–1.00)
GFR, Estimated: >60 mL/min (ref >60)
Glucose, Bld: 101 mg/dL — ABNORMAL HIGH (ref 70–99)
Potassium: 3.9 mmol/L (ref 3.5–5.1)
Sodium: 136 mmol/L (ref 135–145)

## 2020-10-07 LAB — RAPID URINE DRUG SCREEN, HOSP PERFORMED
Amphetamines: NOT DETECTED
Barbiturates: NOT DETECTED
Benzodiazepines: NOT DETECTED
Cocaine: NOT DETECTED
Opiates: POSITIVE — AB
Tetrahydrocannabinol: NOT DETECTED

## 2020-10-07 LAB — URINALYSIS, ROUTINE W REFLEX MICROSCOPIC
Bilirubin Urine: NEGATIVE
Glucose, UA: NEGATIVE mg/dL
Hgb urine dipstick: NEGATIVE
Ketones, ur: NEGATIVE mg/dL
Leukocytes,Ua: NEGATIVE
Nitrite: NEGATIVE
Protein, ur: 30 mg/dL — AB
Specific Gravity, Urine: 1.01 (ref 1.005–1.030)
pH: 8 (ref 5.0–8.0)

## 2020-10-07 LAB — CBC
HCT: 33.5 % — ABNORMAL LOW (ref 36.0–46.0)
Hemoglobin: 10.8 g/dL — ABNORMAL LOW (ref 12.0–15.0)
MCH: 28.3 pg (ref 26.0–34.0)
MCHC: 32.2 g/dL (ref 30.0–36.0)
MCV: 87.9 fL (ref 80.0–100.0)
Platelets: 256 10*3/uL (ref 150–400)
RBC: 3.81 MIL/uL — ABNORMAL LOW (ref 3.87–5.11)
RDW: 15.7 % — ABNORMAL HIGH (ref 11.5–15.5)
WBC: 9.3 10*3/uL (ref 4.0–10.5)
nRBC: 0 % (ref 0.0–0.2)

## 2020-10-07 LAB — ACETAMINOPHEN LEVEL: Acetaminophen (Tylenol), Serum: 13 ug/mL (ref 10–30)

## 2020-10-07 LAB — VITAMIN B12: Vitamin B-12: 229 pg/mL (ref 180–914)

## 2020-10-07 LAB — T4: T4, Total: 5.6 ug/dL (ref 4.5–12.0)

## 2020-10-07 LAB — SALICYLATE LEVEL: Salicylate Lvl: <7 mg/dL — ABNORMAL LOW (ref 7.0–30.0)

## 2020-10-07 MED ORDER — OXYCODONE HCL 5 MG PO TABS: 5.0000 mg | ORAL_TABLET | Freq: Four times a day (QID) | ORAL | Status: DC | PRN

## 2020-10-07 MED ORDER — APIXABAN 5 MG PO TABS
5.0000 mg | ORAL_TABLET | Freq: Two times a day (BID) | ORAL | Status: DC
Start: 2020-10-07 — End: 2020-10-13
  Administered 2020-10-07 – 2020-10-12 (×11): 5 mg via ORAL
  Filled 2020-10-07 (×11): qty 1

## 2020-10-07 MED ORDER — HALOPERIDOL LACTATE 5 MG/ML IJ SOLN
1.0000 mg | Freq: Four times a day (QID) | INTRAMUSCULAR | Status: DC | PRN
  Administered 2020-10-07 – 2020-10-09 (×6): 1 mg via INTRAVENOUS
  Filled 2020-10-07 (×6): qty 1

## 2020-10-07 MED ORDER — MORPHINE SULFATE (PF) 2 MG/ML IV SOLN: 2.0000 mg | Freq: Once | INTRAVENOUS | Status: DC

## 2020-10-07 MED ORDER — LEVOTHYROXINE SODIUM 25 MCG PO TABS
125.0000 ug | ORAL_TABLET | Freq: Every day | ORAL | Status: DC
  Administered 2020-10-09 – 2020-10-12 (×4): 125 ug via ORAL
  Filled 2020-10-07 (×5): qty 1

## 2020-10-07 NOTE — Progress Notes (Signed)
Pt's daughter- [name] provided history for patient.  She can be reached: [phone].

## 2020-10-07 NOTE — Progress Notes (Signed)
Admission note:   Arrival Method: from ED via stretcher with RN  Mental Orientation: Oriented to person, time; Disoriented to situation, place: Daughter at bedside observed.  Telemetry: Yes, box 17 applied and confirmed with Central Telemetry Assessment: See Flowsheets Skin: See Flowsheets IV: LAC SL Pain: No pain Tubes: N/A Safety Measures: Patient Handbook, low bed, bed alarm, family at bedside  Admission: Completed and admission orders have been reviewed 45M Orientation: Pt and family have been oriented to the unit, staff, and room.  Family: Daughter at bedside

## 2020-10-07 NOTE — Progress Notes (Signed)
Per conversation with Nurse staff; Due to agitation, Will attempt with two techs and family member at bedside when schedule permits this afternoon.

## 2020-10-07 NOTE — Progress Notes (Signed)
Pt refusing to have lab draw completed. Yelling and swatting at lab staff. Labs not drawn at this time. MD on call made aware. Daughter to be at bedside later today. Day shift made aware.

## 2020-10-07 NOTE — Progress Notes (Signed)
   10/07/20 0138  Vitals  Temp 97.7 F (36.5 C)  Temp Source Axillary  BP (!) 145/95  MAP (mmHg) 108  BP Location Right Arm  BP Method Automatic  Patient Position (if appropriate) Sitting  Pulse Rate Source Monitor  Resp 18  Level of Consciousness  Level of Consciousness Alert  MEWS COLOR  MEWS Score Color Green  Oxygen Therapy  SpO2 100 %  O2 Device Room Air  Pain Assessment  Pain Scale 0-10  Pain Score 4  Pain Type Acute pain  MEWS Score  MEWS Temp 0  MEWS Systolic 0  MEWS Pulse 0  MEWS RR 0  MEWS LOC 0  MEWS Score 0   See Post Fall Flowsheet for further details.

## 2020-10-07 NOTE — Progress Notes (Signed)
Pt has been on apixaban 2.5mg  BID for her recent hx of CVA. She is <81 yo, wt>60kg, scr<1.5. Ok to inrease dose to 5mg  PO BID per Dr. .  Jomarie Longs, PharmD, BCIDP, AAHIVP, CPP Infectious Disease Pharmacist 10/07/2020 9:56 AM

## 2020-10-07 NOTE — Evaluation (Signed)
Physical Therapy Evaluation Patient Details Name: Carmen [name] MRN: [id number] DOB: 1941/06/05 Today's Date: 10/07/2020   History of Present Illness  Carmen [name] is a 80 y.o. 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 Presented to ED with confusion, weakness, and trouble walking, frequent falls at least one where she hit her head.  Clinical Impression   Patient received in bed, anxious and borderline agitated at times- upset and tells [place] "I have been kidnapped, my daughters don't know where I am and I am wet and no one believes me". Able to be redirected and participated in mobility but needed ModAx2 for safe EOB/OOB activities due to strong posterior lean, impaired safety and deficit awareness, and impulsivity. Made very little effort to actively correct posterior lean and was reliant on external support to prevent a fall. Fatigued at EOS and left in bed with bed alarm active and telesitter on. Would benefit from SNF prior to return home.     Follow Up Recommendations SNF;Supervision/Assistance - 24 hour;Other (comment) (versus HHPT with 24/7 ONLY if family can safely manage patient/provide appropriate level of assist)    Equipment Recommendations  Wheelchair (measurements PT);3in1 (PT);Wheelchair cushion (measurements PT);Rolling walker with 5" wheels    Recommendations for Other Services       Precautions / Restrictions Precautions Precautions: Fall Restrictions Weight Bearing Restrictions: No      Mobility  Bed Mobility Overal bed mobility: Needs Assistance Bed Mobility: Supine to Sit;Sit to Supine     Supine to sit: Mod assist;+2 for physical assistance;HOB elevated Sit to supine: Mod assist;+2 for physical assistance   General bed mobility comments: use of bed rails and trouble sequencing, needed most assist to help coordinate and manage BLEs and  trunk    Transfers Overall transfer level: Needs assistance Equipment used: Rolling walker (2 wheeled);2 person hand held assist Transfers: Sit to/from [employer] Transfers Sit to Stand: Mod assist;+2 physical assistance;+2 safety/equipment Stand pivot transfers: Mod assist;+2 physical assistance;+2 safety/equipment       General transfer comment: strong posterior lean during transfers- no difference between 2 person HHA and use of RW. Sequencing actually worse with use of assistive device  Ambulation/Gait Ambulation/Gait assistance: +2 physical assistance;Mod assist Gait Distance (Feet): 28 Feet (22ft, then 18ft, then 21ft) Assistive device: Rolling walker (2 wheeled);2 person hand held assist Gait Pattern/deviations: Step-to pattern;Decreased step length - right;Decreased step length - left;Decreased stance time - right;Decreased stance time - left;Decreased stride length;Narrow base of support;Drifts right/left;Leaning posteriorly Gait velocity: decreased   General Gait Details: performed two 27ft gait distances with 2 person HHA, strong posterior lean with little awareness of lean and no active attempts to correct it. Trialed RW but no improvement in terms of posterior lean/balance and had a very difficult time sequencing with device.  Stairs            Wheelchair Mobility    Modified Rankin (Stroke Patients Only)       Balance Overall balance assessment: Needs assistance Sitting-balance support: Bilateral upper extremity supported;Feet supported Sitting balance-[name]: Poor Sitting balance - Comments: at least min A Postural control: Posterior lean (STRONG in standing, mild in sitting) Standing balance support: Bilateral upper extremity supported Standing balance-[name]: Poor Standing balance comment: strong posterior lean  Pertinent Vitals/Pain Pain Assessment: No/denies pain    Home Living Family/patient  expects to be discharged to:: Private residence Living Arrangements: Children (daughter) Available Help at Discharge: Family;Available PRN/intermittently Type of Home: House Home Access: Level entry (lives downstairs)     Home Layout: Able to live on main level with bedroom/bathroom   Additional Comments: Pt unreliable historian    Prior Function Level of Independence: Independent         Comments: per chart review, Pt has been experiencing frequent falls, non-compliant with medications and health has been failing the past month     Hand Dominance   Dominant Hand: Right    Extremity/Trunk Assessment   Upper Extremity Assessment Upper Extremity Assessment: Defer to OT evaluation    Lower Extremity Assessment Lower Extremity Assessment: Generalized weakness    Cervical / Trunk Assessment Cervical / Trunk Assessment: Kyphotic  Communication   Communication: No difficulties  Cognition Arousal/Alertness: Awake/alert Behavior During Therapy: Agitated;Anxious Overall Cognitive Status: Impaired/Different from baseline Area of Impairment: Orientation;Attention;Following commands;Safety/judgement;Awareness;Problem solving;Memory                 Orientation Level: Disoriented to;Place;Situation Current Attention Level: Sustained Memory: Decreased short-term memory;Decreased recall of precautions Following Commands: Follows one step commands with increased time Safety/Judgement: Decreased awareness of safety;Decreased awareness of deficits Awareness: Intellectual Problem Solving: Decreased initiation;Difficulty sequencing;Requires verbal cues;Requires tactile cues General Comments: Pt claiming she "was a hostage" "i would have called 911 but my phone does not work" unable to correct balance in standing despite multimodal cues, repetition of task at hand - but did have awareness that she was wet in the bed "I told them I was wet and they did not believe me"       General Comments General comments (skin integrity, edema, etc.): no family present    Exercises     Assessment/Plan    PT Assessment Patient needs continued PT services  PT Problem List Decreased strength;Decreased cognition;Decreased knowledge of use of DME;Decreased activity tolerance;Decreased safety awareness;Decreased balance;Decreased mobility;Decreased coordination       PT Treatment Interventions DME instruction;Balance training;Gait training;Stair training;Neuromuscular re-education;Cognitive remediation;Patient/family education;Functional mobility training;Therapeutic activities;Therapeutic exercise;Wheelchair mobility training    PT Goals (Current goals can be found in the Care Plan section)  Acute Rehab PT Goals Patient Stated Goal: "get outta here!" PT Goal Formulation: With patient Time For Goal Achievement: 10/21/20 Potential to Achieve Goals: Fair    Frequency Min 2X/week   Barriers to discharge        Co-evaluation PT/OT/SLP Co-Evaluation/Treatment: Yes Reason for Co-Treatment: Necessary to address cognition/behavior during functional activity;Complexity of the patient's impairments (multi-system involvement);For patient/therapist safety PT goals addressed during session: Mobility/safety with mobility;Balance;Proper use of DME OT goals addressed during session: ADL's and self-care;Proper use of Adaptive equipment and DME       AM-PAC PT "6 Clicks" Mobility  Outcome Measure Help needed turning from your back to your side while in a flat bed without using bedrails?: A Little Help needed moving from lying on your back to sitting on the side of a flat bed without using bedrails?: A Lot Help needed moving to and from a bed to a chair (including a wheelchair)?: A Lot Help needed standing up from a chair using your arms (e.g., wheelchair or bedside chair)?: A Lot Help needed to walk in hospital room?: A Lot Help needed climbing 3-5 steps with a railing? :  Total 6 Click Score: 12    End of Session Equipment Utilized During Treatment: Gait  belt Activity Tolerance: Patient tolerated treatment well Patient left: in bed;with call bell/phone within reach;with bed alarm set Nurse Communication: Mobility status PT Visit Diagnosis: Unsteadiness on feet (R26.81);Difficulty in walking, not elsewhere classified (R26.2);Muscle weakness (generalized) (M62.81);History of falling (Z91.81)    Time: 1020-1050 PT Time Calculation (min) (ACUTE ONLY): 30 min   Charges:   PT Evaluation $PT Eval Moderate Complexity: 1 Mod (co-eval with OT)          Windell Norfolk, DPT, PN1   Supplemental Physical Therapist [place]    Pager [phone] Acute Rehab Office [phone]

## 2020-10-07 NOTE — Evaluation (Signed)
Occupational Therapy Evaluation Patient Details Name: Carmen [name] MRN: [id number] DOB: 1941/09/23 Today's Date: 10/07/2020    History of Present Illness Carmen [name] is a 80 y.o. 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 Presented to ED with confusion, weakness, and trouble walking, frequent falls at least one where she hit her head.   Clinical Impression   Pt unreliable historian today, continues with AMS disoriented and presents with borderline agitation/anxiety. Pt was mod A +2 for bed mobility, toilet transfer, max A to manage underwear, but pt able to perform peri care. Min to mod A at sink due to strong posterior lean, and Pt requiring multimodal cues for task completion. Pt would benefit from skilled OT in the acute setting and recommend SNF post-acute to maximize safety and independence in ADL and functional transfers.     Follow Up Recommendations  SNF (if daughter can provide 24/7 and physical assist then [facility])    Equipment Recommendations  3 in 1 bedside commode    Recommendations for Other Services       Precautions / Restrictions Precautions Precautions: Fall Restrictions Weight Bearing Restrictions: No      Mobility Bed Mobility Overal bed mobility: Needs Assistance Bed Mobility: Supine to Sit;Sit to Supine     Supine to sit: Mod assist;+2 for physical assistance;HOB elevated (assist for trunk elevation and hips EOB) Sit to supine: Mod assist;+2 for physical assistance (assist for BLE back into bed and safety with trunk)   General bed mobility comments: use of bed rails and trouble sequencing    Transfers Overall transfer level: Needs assistance Equipment used: Rolling walker (2 wheeled);2 person hand held assist Transfers: Sit to/from [employer] Sit to Stand: Mod assist;+2 physical assistance;+2  safety/equipment Stand pivot transfers: Mod assist;+2 physical assistance;+2 safety/equipment       General transfer comment: strong posterior lean during transfers    Balance Overall balance assessment: Needs assistance Sitting-balance support: Bilateral upper extremity supported;Feet supported Sitting balance-[name]: Poor Sitting balance - Comments: at least min A Postural control: Posterior lean (STRONG in standing, mild in sitting) Standing balance support: Bilateral upper extremity supported Standing balance-[name]: Poor Standing balance comment: strong posterior lean                           ADL either performed or assessed with clinical judgement   ADL Overall ADL's : Needs assistance/impaired Eating/Feeding: Set up;Sitting   Grooming: Wash/dry hands;Wash/dry face;Minimal assistance;Standing Grooming Details (indicate cue type and reason): min A for standing balance, unable to reach soap, required assist to problem solve that "the water is too hot" Upper Body Bathing: Moderate assistance   Lower Body Bathing: Maximal assistance   Upper Body Dressing : Moderate assistance Upper Body Dressing Details (indicate cue type and reason): to don gown like robe Lower Body Dressing: Maximal assistance;Sit to/from stand   Toilet Transfer: Moderate assistance;+2 for safety/equipment;Ambulation Toilet Transfer Details (indicate cue type and reason): 2 person HHA, strong posterior lean Toileting- Clothing Manipulation and Hygiene: Moderate assistance;Sit to/from stand Toileting - Clothing Manipulation Details (indicate cue type and reason): Pt able to perform wiping, required mod A for boost into standing     Functional mobility during ADLs: Moderate assistance;+2 for safety/equipment;Cueing for safety;Cueing for sequencing;Rolling walker (and 2 person HHA) General ADL Comments: cues for task initiation, sequencing, problem solving, attention  Vision  Baseline Vision/History: Wears glasses Wears Glasses: Reading only Additional Comments: glasses in room, Pt did not report any changes in vision - but unable to asses due to cognition     Perception     Praxis      Pertinent Vitals/Pain Pain Assessment: No/denies pain     Hand Dominance Right   Extremity/Trunk Assessment Upper Extremity Assessment Upper Extremity Assessment: Generalized weakness   Lower Extremity Assessment Lower Extremity Assessment: Defer to PT evaluation   Cervical / Trunk Assessment Cervical / Trunk Assessment: Kyphotic   Communication Communication Communication: No difficulties   Cognition Arousal/Alertness: Awake/alert Behavior During Therapy: Agitated;Anxious Overall Cognitive Status: Impaired/Different from baseline Area of Impairment: Orientation;Attention;Following commands;Safety/judgement;Awareness;Problem solving;Memory                 Orientation Level: Disoriented to;Place;Situation Current Attention Level: Sustained Memory: Decreased short-term memory;Decreased recall of precautions Following Commands: Follows one step commands with increased time Safety/Judgement: Decreased awareness of safety;Decreased awareness of deficits Awareness: Intellectual Problem Solving: Decreased initiation;Difficulty sequencing;Requires verbal cues;Requires tactile cues General Comments: Pt claiming she "was a hostage" "i would have called 911 but my phone does not work" unable to correct balance in standing despite multimodal cues, repetition of task at hand - but did have awareness that she was wet in the bed "I told them I was wet and they did not believe me"   General Comments  no family present    Exercises     Shoulder Instructions      Home Living Family/patient expects to be discharged to:: Private residence Living Arrangements: Children (daughter) Available Help at Discharge: Family;Available PRN/intermittently Type of Home:  House Home Access: Level entry (lives downstairs)     Home Layout: Able to live on main level with bedroom/bathroom         Bathroom Toilet: Standard         Additional Comments: Pt unreliable historian      Prior Functioning/Environment Level of Independence: Independent        Comments: per chart review, Pt has been experiencing frequent falls, non-compliant with medications and health has been failing the past month        OT Problem List: Decreased activity tolerance;Impaired balance (sitting and/or standing);Decreased cognition;Decreased safety awareness;Decreased knowledge of use of DME or AE      OT Treatment/Interventions: Self-care/ADL training;Therapeutic exercise;DME and/or AE instruction;Therapeutic activities;Cognitive remediation/compensation;Patient/family education;Balance training    OT Goals(Current goals can be found in the care plan section) Acute Rehab OT Goals Patient Stated Goal: "get outta here!" OT Goal Formulation: With patient Time For Goal Achievement: 10/21/20 Potential to Achieve Goals: Good  OT Frequency: Min 2X/week   Barriers to D/C:    unsure if daughter can provide 24/7 supervision       Co-evaluation PT/OT/SLP Co-Evaluation/Treatment: Yes Reason for Co-Treatment: Necessary to address cognition/behavior during functional activity;For patient/therapist safety;To address functional/ADL transfers PT goals addressed during session: Mobility/safety with mobility;Balance;Proper use of DME OT goals addressed during session: ADL's and self-care;Proper use of Adaptive equipment and DME      AM-PAC OT "6 Clicks" Daily Activity     Outcome Measure Help from another person eating meals?: A Little Help from another person taking care of personal grooming?: A Lot Help from another person toileting, which includes using toliet, bedpan, or urinal?: A Lot Help from another person bathing (including washing, rinsing, drying)?: A Lot Help from  another person to put on and taking off regular upper body clothing?: A Lot  Help from another person to put on and taking off regular lower body clothing?: A Lot 6 Click Score: 13   End of Session Equipment Utilized During Treatment: Gait belt;Rolling walker Nurse Communication: Mobility status  Activity Tolerance: Patient tolerated treatment well Patient left: in bed;with call bell/phone within reach;with bed alarm set;Other (comment) (telesitter confirmed visual confirmation)  OT Visit Diagnosis: Unsteadiness on feet (R26.81);Other abnormalities of gait and mobility (R26.89);Muscle weakness (generalized) (M62.81);Other symptoms and signs involving cognitive function                Time: 1020-1057 OT Time Calculation (min): 37 min Charges:  OT General Charges $OT Visit: 1 Visit OT Evaluation $OT Eval Moderate Complexity: 1 Mod OT Treatments $Self Care/Home Management : 8-22 mins  Jesse Sans OTR/L Acute Rehabilitation Services Pager: [phone] Office: [place] 10/07/2020, 11:32 AM

## 2020-10-07 NOTE — Progress Notes (Signed)
Callback from pt's daughter, [name]. Reviewed fall, safety precautions, x-ray orders, labs, prn medication, and plan of care. Daughter reports she will be at bedside later today. All questions answered at this time.

## 2020-10-07 NOTE — Discharge Instructions (Signed)

## 2020-10-07 NOTE — Progress Notes (Signed)
PROGRESS NOTE    Carmen [name]  TIW:[id number] DOB: 1941/07/21 DOA: 10/06/2020 PCP: Patient, No Pcp Per  Brief Narrative: 80 year old female with history of atrial fibrillation, CVA in September 2021, depression, anxiety, bipolar disorder, after her stroke she moved in with one of her daughters in [place] who was unable to care for her after which she moved and stayed with another daughter for a few weeks, she had multiple falls and eventually one of her daughters from [place] drove over and brought her back with her 8 days ago. -Patient was initially managing her medications however daughter started doing this in the last 2 days, she noticed that patient was sleeping more than usual in the last 24 hours and slightly more confused.  No history of fevers, no cough congestion shortness of breath nausea vomiting or diarrhea -In the ED she was noted to be afebrile with normal vital signs, labs were unremarkable except for mild hyponatremia   Assessment & Plan:   Confusion Mild encephalopathy, FTT -This morning patient is awake alert, angry irritable anxious to go home, has very limited insight, cognitive deficits noted -I suspect this is multifactorial in the setting of hyponatremia, recent stroke, polypharmacy namely lithium, narcotics, benzodiazepines etc. (she was started on lithium and hydrocodone 2 months ago in [facility]) -MRI brain without acute findings -Urinalysis unremarkable, ammonia level is normal, lithium level- normal -Sodium has normalized -TSH is mildly elevated at 10.7, suspect patient either not taking her meds appropriately or needs higher dosing, will change dose from 112 to 125 mcg daily -PT OT -Definitely needs supervision, she may need a higher level of care, daughter requests rehab eventually ALF -Check B12  Paroxysmal atrial fibrillation -Continue metoprolol and Eliquis, dose increased to 5 mg twice daily-based on her weight, GFR, age  History of  CVA -Continue Eliquis and statin, as above, will discontinue aspirin  Hypertension -Stable, continue amlodipine and benazepril  Hypothyroidism -TSH mildly elevated, unclear if she was taking Synthroid appropriately, dose increased to 125 mcg daily  Bipolar disorder Anxiety Depression -Continued on lithium, levels are okay -Klonopin resumed per home regimen  DVT prophylaxis: Eliquis Code Status: Full code Family Communication: No family at bedside, called and updated daughter Disposition Plan:  Status is: Observation  The patient will require care spanning > 2 midnights and should be moved to inpatient because: Unsafe d/c plan anticipate patient needs higher level of care, daughter is unable to take care of her  Dispo: The patient is from: Home              Anticipated d/c is to: SNF              Anticipated d/c date is: 1 day              Patient currently is not medically stable to d/c.  Consultants:   Procedures:   Antimicrobials:    Subjective: -Angry and agitated this morning, adamant to go home  Objective: Vitals:   10/07/20 0001 10/07/20 0138 10/07/20 0525 10/07/20 0805  BP: (!) 154/94 (!) 145/95 (!) 159/83 (!) 145/56  Pulse: 95  100 (!) 102  Resp:  18    Temp: 98.2 F (36.8 C) 97.7 F (36.5 C) 98.6 F (37 C) 98.6 F (37 C)  TempSrc: Oral Axillary Oral Oral  SpO2: 97% 100% 96% 98%  Weight: 83.5 kg     Height:        Intake/Output Summary (Last 24 hours) at 10/07/2020 1125 Last  data filed at 10/07/2020 0804 Gross per 24 hour  Intake 1200 ml  Output --  Net 1200 ml   Filed Weights   10/06/20 1702 10/07/20 0001  Weight: 77.1 kg 83.5 kg    Examination:  General exam:   Elderly female sitting up in the recliner, awake alert oriented to self and place, cognitive deficits noted, angry and irritable CVS: S1-S2, regular rate rhythm Lungs: Decreased breath sounds at the bases otherwise clear Abdomen: Soft, nontender, bowel sounds  present Extremities: No edema Neuro: Cranial nerves no deficits, moves all extremities, no localizing findings Psych: Poor insight and judgment  Data Reviewed:   CBC: Recent Labs  Lab 10/06/20 2202 10/07/20 0253  WBC 9.2 9.3  NEUTROABS 6.1  --   HGB 10.8* 10.8*  HCT 32.4* 33.5*  MCV 88.8 87.9  PLT 256 256   Basic Metabolic Panel: Recent Labs  Lab 10/06/20 2202 10/07/20 0253  NA 128* 136  K 3.6 3.9  CL 99 104  CO2 21* 23  GLUCOSE 135* 101*  BUN 9 7*  CREATININE 0.74 0.70  CALCIUM 8.4* 9.0   GFR: Estimated Creatinine Clearance: 64.4 mL/min (by C-G formula based on SCr of 0.7 mg/dL). Liver Function Tests: Recent Labs  Lab 10/06/20 2202  AST 15  ALT 14  ALKPHOS 68  BILITOT 0.8  PROT 6.1*  ALBUMIN 3.3*   No results for input(s): LIPASE, AMYLASE in the last 168 hours. Recent Labs  Lab 10/06/20 1651  AMMONIA 19   Coagulation Profile: No results for input(s): INR, PROTIME in the last 168 hours. Cardiac Enzymes: No results for input(s): CKTOTAL, CKMB, CKMBINDEX, TROPONINI in the last 168 hours. BNP (last 3 results) No results for input(s): PROBNP in the last 8760 hours. HbA1C: No results for input(s): HGBA1C in the last 72 hours. CBG: No results for input(s): GLUCAP in the last 168 hours. Lipid Profile: No results for input(s): CHOL, HDL, LDLCALC, TRIG, CHOLHDL, LDLDIRECT in the last 72 hours. Thyroid Function Tests: Recent Labs    10/06/20 1651  TSH 10.765*  T4TOTAL 5.6   Anemia Panel: No results for input(s): VITAMINB12, FOLATE, FERRITIN, TIBC, IRON, RETICCTPCT in the last 72 hours. Urine analysis:    Component Value Date/Time   COLORURINE YELLOW 10/07/2020 1029   APPEARANCEUR CLEAR 10/07/2020 1029   LABSPEC 1.010 10/07/2020 1029   PHURINE 8.0 10/07/2020 1029   GLUCOSEU NEGATIVE 10/07/2020 1029   HGBUR NEGATIVE 10/07/2020 1029   BILIRUBINUR NEGATIVE 10/07/2020 1029   KETONESUR NEGATIVE 10/07/2020 1029   PROTEINUR 30 (A) 10/07/2020 1029    NITRITE NEGATIVE 10/07/2020 1029   LEUKOCYTESUR NEGATIVE 10/07/2020 1029   Sepsis Labs: @LABRCNTIP (procalcitonin:4,lacticidven:4)  ) Recent Results (from the past 240 hour(s))  Resp Panel by RT-PCR (Flu A&B, Covid) Nasopharyngeal Swab     Status: None   Collection Time: 10/06/20  4:52 PM   Specimen: Nasopharyngeal Swab; Nasopharyngeal(NP) swabs in vial transport medium  Result Value Ref Range Status   SARS Coronavirus 2 by RT PCR NEGATIVE NEGATIVE Final    Comment: (NOTE) SARS-CoV-2 target nucleic acids are NOT DETECTED.  The SARS-CoV-2 RNA is generally detectable in upper respiratory specimens during the acute phase of infection. The lowest concentration of SARS-CoV-2 viral copies this assay can detect is 138 copies/mL. A negative result does not preclude SARS-Cov-2 infection and should not be used as the sole basis for treatment or other patient management decisions. A negative result may occur with  improper specimen collection/handling, submission of specimen other than nasopharyngeal swab,  presence of viral mutation(s) within the areas targeted by this assay, and inadequate number of viral copies(<138 copies/mL). A negative result must be combined with clinical observations, patient history, and epidemiological information. The expected result is Negative.  Fact Sheet for Patients:  EntrepreneurPulse.com.au  Fact Sheet for Healthcare Providers:  IncredibleEmployment.be  This test is no t yet approved or cleared by the [place] FDA and  has been authorized for detection and/or diagnosis of SARS-CoV-2 by FDA under an Emergency Use Authorization (EUA). This EUA will remain  in effect (meaning this test can be used) for the duration of the COVID-19 declaration under Section 564(b)(1) of the Act, 21 U.S.C.section 360bbb-3(b)(1), unless the authorization is terminated  or revoked sooner.       Influenza A by PCR NEGATIVE NEGATIVE  Final   Influenza B by PCR NEGATIVE NEGATIVE Final    Comment: (NOTE) The Xpert Xpress SARS-CoV-2/FLU/RSV plus assay is intended as an aid in the diagnosis of influenza from Nasopharyngeal swab specimens and should not be used as a sole basis for treatment. Nasal washings and aspirates are unacceptable for Xpert Xpress SARS-CoV-2/FLU/RSV testing.  Fact Sheet for Patients: EntrepreneurPulse.com.au  Fact Sheet for Healthcare Providers: IncredibleEmployment.be  This test is not yet approved or cleared by the [place] FDA and has been authorized for detection and/or diagnosis of SARS-CoV-2 by FDA under an Emergency Use Authorization (EUA). This EUA will remain in effect (meaning this test can be used) for the duration of the COVID-19 declaration under Section 564(b)(1) of the Act, 21 U.S.C. section 360bbb-3(b)(1), unless the authorization is terminated or revoked.  Performed at [facility], [facility] [address] [place], [place] [address]          Radiology Studies: DG Forearm Right  Result Date: 10/07/2020 CLINICAL DATA:  Pain and bruising. EXAM: RIGHT FOREARM - 2 VIEW COMPARISON:  No prior. FINDINGS: Soft tissue swelling. Tiny bony density noted adjacent to the ulnar epicondyle of the distal humerus. This may be from prior ligamentous injury. No acute bony or joint abnormality otherwise noted. No evidence of elbow joint effusion. IMPRESSION: Soft tissue swelling. Tiny bony density noted adjacent to the ulnar epicondyle of the distal humerus. This may be from prior ligamentous injury. No acute bony or joint abnormality otherwise noted. Electronically Signed   By: Marcello Moores  Register   On: 10/07/2020 07:19   MR BRAIN WO CONTRAST  Result Date: 10/06/2020 CLINICAL DATA:  Initial evaluation for acute dizziness, mental status change. EXAM: MRI HEAD WITHOUT CONTRAST TECHNIQUE: Multiplanar, multiecho pulse sequences of the brain and surrounding  structures were obtained without intravenous contrast. COMPARISON:  Level none available. FINDINGS: Brain: Examination severely limited as the patient was unable to tolerate the full length of the exam. Axial coronal DWI sequences only were performed. Additionally, images provided are markedly degraded by motion artifact. Diffusion-weighted imaging demonstrates no evidence for acute or subacute ischemia. Areas of encephalomalacia involving the parasagittal right frontal lobe and left cerebellum likely reflect remote infarcts, incompletely assessed on this limited exam. No definite mass lesion, mass effect, or midline shift. No hydrocephalus or visible extra-axial fluid collection. Vascular: Not assessed on this technically limited exam. Skull and upper cervical spine: Not assessed on this technically limited exam. Sinuses/Orbits: Not assessed on this technically limited exam. Other: Negative. IMPRESSION: 1. Technically limited exam due to the patient's inability to tolerate the full length of the study as well as extensive motion artifact. Diffusion-weighted sequences only were performed. No evidence for acute or  subacute ischemia. 2. No other definite acute intracranial abnormality. 3. 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 Electronically Signed   By: Rise Mu M.D.   On: 10/06/2020 19:11   DG CHEST PORT 1 VIEW  Result Date: 10/06/2020 CLINICAL DATA:  Altered mental status EXAM: PORTABLE CHEST 1 VIEW COMPARISON:  None. FINDINGS: The lungs are symmetrically well expanded and are clear. No pneumothorax or pleural effusion. Coronary artery bypass grafting has been performed. Mild cardiomegaly is present. The pulmonary vascularity is normal. IMPRESSION: No active disease.  Mild cardiomegaly. Electronically Signed   By: Helyn Numbers MD   On: 10/06/2020 22:52   DG HIP PORT UNILAT WITH PELVIS 1V LEFT  Result Date: 10/07/2020 CLINICAL  DATA:  Fall, left hip pain EXAM: DG HIP (WITH OR WITHOUT PELVIS) 1V PORT LEFT COMPARISON:  None. FINDINGS: There is no evidence of hip fracture or dislocation. There is no evidence of arthropathy or other focal bone abnormality. IMPRESSION: Negative. Electronically Signed   By: Helyn Numbers MD   On: 10/07/2020 02:37        Scheduled Meds:  amLODipine  10 mg Oral Daily   And   benazepril  20 mg Oral Daily   apixaban  5 mg Oral BID   aspirin EC  81 mg Oral Daily   atorvastatin  20 mg Oral QHS   cilostazol  100 mg Oral BID   clonazePAM  0.5 mg Oral Daily   levothyroxine  112 mcg Oral QAC breakfast   lithium carbonate  300 mg Oral QHS   metoprolol succinate  100 mg Oral QHS   Continuous Infusions:   LOS: 0 days    Time spent:    Zannie Cove, MD [facility]  10/07/2020, 11:25 AM

## 2020-10-08 DIAGNOSIS — G9340 Encephalopathy, unspecified: Secondary | ICD-10-CM | POA: Diagnosis not present

## 2020-10-08 LAB — CBC
HCT: 33.1 % — ABNORMAL LOW (ref 36.0–46.0)
Hemoglobin: 10.8 g/dL — ABNORMAL LOW (ref 12.0–15.0)
MCH: 28.7 pg (ref 26.0–34.0)
MCHC: 32.6 g/dL (ref 30.0–36.0)
MCV: 88 fL (ref 80.0–100.0)
Platelets: 264 10*3/uL (ref 150–400)
RBC: 3.76 MIL/uL — ABNORMAL LOW (ref 3.87–5.11)
RDW: 15.8 % — ABNORMAL HIGH (ref 11.5–15.5)
WBC: 9.8 10*3/uL (ref 4.0–10.5)
nRBC: 0 % (ref 0.0–0.2)

## 2020-10-08 LAB — COMPREHENSIVE METABOLIC PANEL
ALT: 14 U/L (ref 0–44)
AST: 19 U/L (ref 15–41)
Albumin: 3.3 g/dL — ABNORMAL LOW (ref 3.5–5.0)
Alkaline Phosphatase: 68 U/L (ref 38–126)
Anion gap: 12 (ref 5–15)
BUN: 9 mg/dL (ref 8–23)
CO2: 20 mmol/L — ABNORMAL LOW (ref 22–32)
Calcium: 9.1 mg/dL (ref 8.9–10.3)
Chloride: 105 mmol/L (ref 98–111)
Creatinine, Ser: 0.79 mg/dL (ref 0.44–1.00)
GFR, Estimated: >60 mL/min (ref >60)
Glucose, Bld: 164 mg/dL — ABNORMAL HIGH (ref 70–99)
Potassium: 3.7 mmol/L (ref 3.5–5.1)
Sodium: 137 mmol/L (ref 135–145)
Total Bilirubin: 1.2 mg/dL (ref 0.3–1.2)
Total Protein: 6.4 g/dL — ABNORMAL LOW (ref 6.5–8.1)

## 2020-10-08 NOTE — Progress Notes (Signed)
Patient pulling off cardiac monitor wires and refusing to put it back on. Became upset and verbally aggressive. wil put on standby. MD  oncall made aware.

## 2020-10-08 NOTE — Progress Notes (Addendum)
PROGRESS NOTE    Carmen [name]  HBZ:[id number] DOB: 06/16/1941 DOA: 10/06/2020 PCP: Patient, No Pcp Per  Brief Narrative: 80 year old female with history of atrial fibrillation, CVA in September 2021, depression, anxiety, bipolar disorder, after her stroke she moved in with one of her daughters in [place] who was unable to care for her after which she moved and stayed with another daughter for a few weeks, she had multiple falls and eventually one of her daughters from [place] drove over and brought her back with her 8 days ago. -Patient was initially managing her medications however daughter started doing this in the last 2 days, she noticed that patient was sleeping more than usual in the last 24 hours and slightly more confused.  No history of fevers, no cough congestion shortness of breath nausea vomiting or diarrhea -In the ED she was noted to be afebrile with normal vital signs, labs were unremarkable except for mild hyponatremia   Assessment & Plan:   Confusion Mild encephalopathy, FTT -Patient remains awake alert, oriented to self and place, mild memory deficits and limited insight noted  -I suspect this is multifactorial in the setting of hyponatremia, recent stroke, polypharmacy namely lithium, narcotics, benzodiazepines etc. (she was started on lithium and hydrocodone 2 months ago in [facility]) in the background of bipolar disorder -MRI brain without acute findings -Urinalysis unremarkable, ammonia level is normal, lithium level- normal -Sodium has normalized -TSH is mildly elevated at 10.7, suspect patient either not taking her meds appropriately or needed higher dosing, change Synthroid dose from 112 to 125 mcg daily, B12 is normal -PT OT eval completed, 24-hour supervision recommended -Discussed with daughter, she is unable to care for patient SNF, social work consulted   Paroxysmal atrial fibrillation -Continue metoprolol and Eliquis, dose increased to 5 mg  twice daily-based on her weight, GFR, age  History of CVA -Continue Eliquis and statin, as above, will discontinue aspirin  Hyperglycemia -Check hemoglobin A1c  Hypertension -Stable, continue amlodipine and benazepril  Hypothyroidism -TSH mildly elevated, unclear if she was taking Synthroid appropriately, dose increased to 125 mcg daily  Bipolar disorder Anxiety Depression -Continued on lithium, levels are okay -Klonopin resumed per home regimen  DVT prophylaxis: Eliquis Code Status: Full code Family Communication: Discussed with daughter at bedside Disposition Plan:  Status is: Inpatient  Remains inpatient due to: Unsafe d/c plan patient needs higher level of care, daughter is unable to take care of her  Dispo: The patient is from: Home              Anticipated d/c is to: SNF              Anticipated d/c date is: Likely Monday              Patient currently is medically stable to d/c.  Consultants:   Procedures:   Antimicrobials:    Subjective: - was agitated last night, calmer this morning, daughter at bedside, patient reports that she does not want to go to rehab  Objective: Vitals:   10/07/20 0805 10/07/20 1700 10/07/20 2053 10/08/20 1000  BP: (!) 145/56 123/62 122/81 130/86  Pulse: (!) 102 98 95 99  Resp:    18  Temp: 98.6 F (37 C) 98.5 F (36.9 C) 99 F (37.2 C) 99.5 F (37.5 C)  TempSrc: Oral Oral Oral Oral  SpO2: 98% 97% 96% 98%  Weight:      Height:        Intake/Output Summary (Last  24 hours) at 10/08/2020 1102 Last data filed at 10/08/2020 0818 Gross per 24 hour  Intake 637 ml  Output 500 ml  Net 137 ml   Filed Weights   10/06/20 1702 10/07/20 0001  Weight: 77.1 kg 83.5 kg    Examination:  General exam:   Elderly female sitting up in the recliner, awake alert oriented to self and place, cognitive deficits noted, less irritable today CVS: S1-S2, regular rate rhythm Lungs: Decreased breath sounds the bases otherwise clear Abdomen:  Soft, nontender, bowel sounds present Extremities: No edema Neuro: Cranial nerves no deficits, moves all extremities, no localizing findings Psych: Poor insight and judgment  Data Reviewed:   CBC: Recent Labs  Lab 10/06/20 2202 10/07/20 0253 10/08/20 0823  WBC 9.2 9.3 9.8  NEUTROABS 6.1  --   --   HGB 10.8* 10.8* 10.8*  HCT 32.4* 33.5* 33.1*  MCV 88.8 87.9 88.0  PLT 256 256 264   Basic Metabolic Panel: Recent Labs  Lab 10/06/20 2202 10/07/20 0253 10/08/20 0823  NA 128* 136 137  K 3.6 3.9 3.7  CL 99 104 105  CO2 21* 23 20*  GLUCOSE 135* 101* 164*  BUN 9 7* 9  CREATININE 0.74 0.70 0.79  CALCIUM 8.4* 9.0 9.1   GFR: Estimated Creatinine Clearance: 64.4 mL/min (by C-G formula based on SCr of 0.79 mg/dL). Liver Function Tests: Recent Labs  Lab 10/06/20 2202 10/08/20 0823  AST 15 19  ALT 14 14  ALKPHOS 68 68  BILITOT 0.8 1.2  PROT 6.1* 6.4*  ALBUMIN 3.3* 3.3*   No results for input(s): LIPASE, AMYLASE in the last 168 hours. Recent Labs  Lab 10/06/20 1651  AMMONIA 19   Coagulation Profile: No results for input(s): INR, PROTIME in the last 168 hours. Cardiac Enzymes: No results for input(s): CKTOTAL, CKMB, CKMBINDEX, TROPONINI in the last 168 hours. BNP (last 3 results) No results for input(s): PROBNP in the last 8760 hours. HbA1C: No results for input(s): HGBA1C in the last 72 hours. CBG: No results for input(s): GLUCAP in the last 168 hours. Lipid Profile: No results for input(s): CHOL, HDL, LDLCALC, TRIG, CHOLHDL, LDLDIRECT in the last 72 hours. Thyroid Function Tests: Recent Labs    10/06/20 1651  TSH 10.765*  T4TOTAL 5.6   Anemia Panel: Recent Labs    10/07/20 1059  VITAMINB12 229   Urine analysis:    Component Value Date/Time   COLORURINE YELLOW 10/07/2020 1029   APPEARANCEUR CLEAR 10/07/2020 1029   LABSPEC 1.010 10/07/2020 1029   PHURINE 8.0 10/07/2020 1029   GLUCOSEU NEGATIVE 10/07/2020 1029   HGBUR NEGATIVE 10/07/2020 1029    BILIRUBINUR NEGATIVE 10/07/2020 1029   KETONESUR NEGATIVE 10/07/2020 1029   PROTEINUR 30 (A) 10/07/2020 1029   NITRITE NEGATIVE 10/07/2020 1029   LEUKOCYTESUR NEGATIVE 10/07/2020 1029   Sepsis Labs: @LABRCNTIP (procalcitonin:4,lacticidven:4)  ) Recent Results (from the past 240 hour(s))  Resp Panel by RT-PCR (Flu A&B, Covid) Nasopharyngeal Swab     Status: None   Collection Time: 10/06/20  4:52 PM   Specimen: Nasopharyngeal Swab; Nasopharyngeal(NP) swabs in vial transport medium  Result Value Ref Range Status   SARS Coronavirus 2 by RT PCR NEGATIVE NEGATIVE Final    Comment: (NOTE) SARS-CoV-2 target nucleic acids are NOT DETECTED.  The SARS-CoV-2 RNA is generally detectable in upper respiratory specimens during the acute phase of infection. The lowest concentration of SARS-CoV-2 viral copies this assay can detect is 138 copies/mL. A negative result does not preclude SARS-Cov-2 infection and should  not be used as the sole basis for treatment or other patient management decisions. A negative result may occur with  improper specimen collection/handling, submission of specimen other than nasopharyngeal swab, presence of viral mutation(s) within the areas targeted by this assay, and inadequate number of viral copies(<138 copies/mL). A negative result must be combined with clinical observations, patient history, and epidemiological information. The expected result is Negative.  Fact Sheet for Patients:  BloggerCourse.com  Fact Sheet for Healthcare Providers:  SeriousBroker.it  This test is no t yet approved or cleared by the [place] FDA and  has been authorized for detection and/or diagnosis of SARS-CoV-2 by FDA under an Emergency Use Authorization (EUA). This EUA will remain  in effect (meaning this test can be used) for the duration of the COVID-19 declaration under Section 564(b)(1) of the Act, 21 U.S.C.section  360bbb-3(b)(1), unless the authorization is terminated  or revoked sooner.       Influenza A by PCR NEGATIVE NEGATIVE Final   Influenza B by PCR NEGATIVE NEGATIVE Final    Comment: (NOTE) The Xpert Xpress SARS-CoV-2/FLU/RSV plus assay is intended as an aid in the diagnosis of influenza from Nasopharyngeal swab specimens and should not be used as a sole basis for treatment. Nasal washings and aspirates are unacceptable for Xpert Xpress SARS-CoV-2/FLU/RSV testing.  Fact Sheet for Patients: BloggerCourse.com  Fact Sheet for Healthcare Providers: SeriousBroker.it  This test is not yet approved or cleared by the [place] FDA and has been authorized for detection and/or diagnosis of SARS-CoV-2 by FDA under an Emergency Use Authorization (EUA). This EUA will remain in effect (meaning this test can be used) for the duration of the COVID-19 declaration under Section 564(b)(1) of the Act, 21 U.S.C. section 360bbb-3(b)(1), unless the authorization is terminated or revoked.  Performed at [facility], [address] [place], [place] [address]          Radiology Studies: DG Forearm Right  Result Date: 10/07/2020 CLINICAL DATA:  Pain and bruising. EXAM: RIGHT FOREARM - 2 VIEW COMPARISON:  No prior. FINDINGS: Soft tissue swelling. Tiny bony density noted adjacent to the ulnar epicondyle of the distal humerus. This may be from prior ligamentous injury. No acute bony or joint abnormality otherwise noted. No evidence of elbow joint effusion. IMPRESSION: Soft tissue swelling. Tiny bony density noted adjacent to the ulnar epicondyle of the distal humerus. This may be from prior ligamentous injury. No acute bony or joint abnormality otherwise noted. Electronically Signed   By: Maisie Fus  Register   On: 10/07/2020 07:19   MR BRAIN WO CONTRAST  Result Date: 10/06/2020 CLINICAL DATA:  Initial evaluation for acute dizziness, mental status  change. EXAM: MRI HEAD WITHOUT CONTRAST TECHNIQUE: Multiplanar, multiecho pulse sequences of the brain and surrounding structures were obtained without intravenous contrast. COMPARISON:  Level none available. FINDINGS: Brain: Examination severely limited as the patient was unable to tolerate the full length of the exam. Axial coronal DWI sequences only were performed. Additionally, images provided are markedly degraded by motion artifact. Diffusion-weighted imaging demonstrates no evidence for acute or subacute ischemia. Areas of encephalomalacia involving the parasagittal right frontal lobe and left cerebellum likely reflect remote infarcts, incompletely assessed on this limited exam. No definite mass lesion, mass effect, or midline shift. No hydrocephalus or visible extra-axial fluid collection. Vascular: Not assessed on this technically limited exam. Skull and upper cervical spine: Not assessed on this technically limited exam. Sinuses/Orbits: Not assessed on this technically limited exam. Other: Negative. IMPRESSION: 1. Technically  limited exam due to the patient's inability to tolerate the full length of the study as well as extensive motion artifact. Diffusion-weighted sequences only were performed. No evidence for acute or subacute ischemia. 2. No other definite acute intracranial abnormality. 3. 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 Electronically Signed   By: Rise Mu M.D.   On: 10/06/2020 19:11   DG CHEST PORT 1 VIEW  Result Date: 10/06/2020 CLINICAL DATA:  Altered mental status EXAM: PORTABLE CHEST 1 VIEW COMPARISON:  None. FINDINGS: The lungs are symmetrically well expanded and are clear. No pneumothorax or pleural effusion. Coronary artery bypass grafting has been performed. Mild cardiomegaly is present. The pulmonary vascularity is normal. IMPRESSION: No active disease.  Mild cardiomegaly. Electronically Signed   By:  Helyn Numbers MD   On: 10/06/2020 22:52   DG HIP PORT UNILAT WITH PELVIS 1V LEFT  Result Date: 10/07/2020 CLINICAL DATA:  Fall, left hip pain EXAM: DG HIP (WITH OR WITHOUT PELVIS) 1V PORT LEFT COMPARISON:  None. FINDINGS: There is no evidence of hip fracture or dislocation. There is no evidence of arthropathy or other focal bone abnormality. IMPRESSION: Negative. Electronically Signed   By: Helyn Numbers MD   On: 10/07/2020 02:37        Scheduled Meds:  amLODipine  10 mg Oral Daily   And   benazepril  20 mg Oral Daily   apixaban  5 mg Oral BID   atorvastatin  20 mg Oral QHS   cilostazol  100 mg Oral BID   clonazePAM  0.5 mg Oral Daily   levothyroxine  125 mcg Oral QAC breakfast   lithium carbonate  300 mg Oral QHS   metoprolol succinate  100 mg Oral QHS   Continuous Infusions:   LOS: 1 day    Time spent:  Zannie Cove, MD [facility]  10/08/2020, 11:02 AM

## 2020-10-09 LAB — HEMOGLOBIN A1C
Hgb A1c MFr Bld: 5.8 % — ABNORMAL HIGH (ref 4.8–5.6)
Mean Plasma Glucose: 119.76 mg/dL

## 2020-10-09 MED ORDER — HYDROCODONE-ACETAMINOPHEN 10-325 MG PO TABS
1.0000 | ORAL_TABLET | Freq: Three times a day (TID) | ORAL | Status: DC | PRN
  Administered 2020-10-09 – 2020-10-12 (×8): 1 via ORAL
  Filled 2020-10-09 (×8): qty 1

## 2020-10-09 NOTE — NC FL2 (Signed)
[place] MEDICAID FL2 LEVEL OF CARE SCREENING TOOL     IDENTIFICATION  Patient Name: Carmen [name] Birthdate: October 29, 1940 Sex: female Admission Date (Current Location): 10/06/2020  [facility] and [place] Number:  Producer, television/film/video and Address:  [facility], [address], [place], [place] [address]      Provider Number: [id number]  Attending Physician Name and Address:  Zannie Cove, MD  Relative Name and Phone Number:       Current Level of Care: Hospital Recommended Level of Care: Skilled Nursing Facility Prior Approval Number:    Date Approved/Denied:   PASRR Number: Pending  Discharge Plan: SNF    Current Diagnoses: Patient Active Problem List   Diagnosis Date Noted   Acute encephalopathy 10/06/2020   CVA (cerebral vascular accident) (HCC) 10/06/2020   Atrial flutter (HCC) 10/06/2020   Depression 10/06/2020   PVD (peripheral vascular disease) (HCC) 10/06/2020   Hypothyroidism 10/06/2020   CAD (coronary artery disease) 10/06/2020    Orientation RESPIRATION BLADDER Height & Weight        Normal Continent Weight: 181 lb (82.1 kg) Height:  5\' 7"  (170.2 cm)  BEHAVIORAL SYMPTOMS/MOOD NEUROLOGICAL BOWEL NUTRITION STATUS  Wanderer,Dangerous to self, others or property   Continent Diet (Heart)  AMBULATORY STATUS COMMUNICATION OF NEEDS Skin   Extensive Assist Verbally                         Personal Care Assistance Level of Assistance  Bathing,Feeding,Dressing Bathing Assistance: Maximum assistance Feeding assistance: Limited assistance Dressing Assistance: Maximum assistance     Functional Limitations Info             SPECIAL CARE FACTORS FREQUENCY  PT (By licensed PT),OT (By licensed OT)     PT Frequency: 5x weekly OT Frequency: 5x weekly            Contractures Contractures Info: Not present    Additional Factors Info  Code Status,Allergies Code Status Info: Full Allergies Info: NKDA            Current Medications (10/09/2020):  This is the current hospital active medication list Current Facility-Administered Medications  Medication Dose Route Frequency Provider Last Rate Last Admin   amLODipine (NORVASC) tablet 10 mg  10 mg Oral Daily 12/07/2020, MD   10 mg at 10/09/20 12/07/20   And   benazepril (LOTENSIN) tablet 20 mg  20 mg Oral Daily 9562, MD   20 mg at 10/09/20 12/07/20   apixaban (ELIQUIS) tablet 5 mg  5 mg Oral BID Pham, Minh Q, RPH-CPP   5 mg at 10/09/20 12/07/20   atorvastatin (LIPITOR) tablet 20 mg  20 mg Oral QHS 6578, MD   20 mg at 10/08/20 2147   cilostazol (PLETAL) tablet 100 mg  100 mg Oral BID 2148, MD   100 mg at 10/09/20 12/07/20   clonazePAM (KLONOPIN) tablet 0.5 mg  0.5 mg Oral Daily 4696, MD   0.5 mg at 10/09/20 12/07/20   docusate sodium (COLACE) capsule 300 mg  300 mg Oral Daily PRN 2952, MD       haloperidol lactate (HALDOL) injection 1 mg  1 mg Intravenous Q6H PRN Eduard Clos, MD   1 mg at 10/09/20 0246   HYDROcodone-acetaminophen (NORCO) 10-325 MG per tablet 1 tablet  1 tablet Oral Q8H PRN 12/07/20, MD   1 tablet at 10/09/20 1138  levothyroxine (SYNTHROID) tablet 125 mcg  125 mcg Oral QAC breakfast Zannie Cove, MD   125 mcg at 10/09/20 0602   lithium carbonate capsule 300 mg  300 mg Oral QHS Eduard Clos, MD   300 mg at 10/08/20 2147   metoprolol succinate (TOPROL-XL) 24 hr tablet 100 mg  100 mg Oral QHS Eduard Clos, MD   100 mg at 10/08/20 2147     Discharge Medications: Please see discharge summary for a list of discharge medications.  Relevant Imaging Results:  Relevant Lab Results:   Additional Information    Annaleigha Woo Geralynn Ochs, LCSW

## 2020-10-09 NOTE — TOC Initial Note (Signed)
Transition of Care [facility]) - Initial/Assessment Note    Patient Details  Name: Carmen [name] MRN: [id number] Date of Birth: [birth year]  [facility]) CM/SW Contact:    Annalee Genta, LCSW Phone Number: 10/09/2020, 4:55 PM  Clinical Narrative: CSW reached out to patient's daughter in [place] regarding recommendation of SNF for physical rehab. CSW provided education on the SNF process and what treatment is. CSW noted they had questions regarding long term care options and CSW discussed long term Medicaid and assisted living options after SNF for short-term rehab. CSW noted preference for [place] area and preference for higher-rated versus closer facility. CSW will begin SNF process.                  Expected Discharge Plan: Skilled Nursing Facility Barriers to Discharge: SNF Pending bed offer,Continued Medical Work up   Patient Goals and CMS Choice   CMS Medicare.gov Compare Post Acute Care list provided to:: Patient Represenative (must comment) (Next of kind, daughter and son-in-law)    Expected Discharge Plan and Services Expected Discharge Plan: Skilled Nursing Facility     Post Acute Care Choice: Skilled Nursing Facility Living arrangements for the past 2 months: Single Family Home (Between multiple family members)                                      Prior Living Arrangements/Services Living arrangements for the past 2 months: Single Family Home (Between multiple family members) Lives with:: Adult Children Patient language and need for interpreter reviewed:: Yes Do you feel safe going back to the place where you live?: Yes      Need for Family Participation in Patient Care: Yes (Comment) Care giver support system in place?: Yes (comment) Current home services:  (No DME at home) Criminal Activity/Legal Involvement Pertinent to Current Situation/Hospitalization: No - Comment as needed  Activities of Daily Living Home Assistive  Devices/Equipment: None ADL Screening (condition at time of admission) Patient's cognitive ability adequate to safely complete daily activities?: No Is the patient deaf or have difficulty hearing?: Yes Does the patient have difficulty seeing, even when wearing glasses/contacts?: No Does the patient have difficulty concentrating, remembering, or making decisions?: Yes Patient able to express need for assistance with ADLs?: Yes Does the patient have difficulty dressing or bathing?: Yes Independently performs ADLs?: No Does the patient have difficulty walking or climbing stairs?: Yes Weakness of Legs: Both Weakness of Arms/Hands: Both  Permission Sought/Granted                  Emotional Assessment       Orientation: : Fluctuating Orientation (Suspected and/or reported Sundowners)   Psych Involvement: Outpatient Provider  Admission diagnosis:  Cough [R05.9] Delirium [R41.0] Acute encephalopathy [G93.40] Adverse effect of drug, initial encounter [T50.905A] Patient Active Problem List   Diagnosis Date Noted  . Acute encephalopathy 10/06/2020  . CVA (cerebral vascular accident) (HCC) 10/06/2020  . Atrial flutter (HCC) 10/06/2020  . Depression 10/06/2020  . PVD (peripheral vascular disease) (HCC) 10/06/2020  . Hypothyroidism 10/06/2020  . CAD (coronary artery disease) 10/06/2020   PCP:  Patient, No Pcp Per Pharmacy:   [id number], [place] - [address] [place] [address] Phone: [phone] Fax: [phone]     Social Determinants of Health (SDOH) Interventions    Readmission Risk Interventions No flowsheet data found.

## 2020-10-09 NOTE — Plan of Care (Signed)

## 2020-10-09 NOTE — Progress Notes (Signed)
Patient seen and examined, elderly female with recent strokes and bipolar disorder -Now awaiting SNF for rehabilitation -TOC consulted  Zannie Cove, MD

## 2020-10-10 DIAGNOSIS — G9340 Encephalopathy, unspecified: Secondary | ICD-10-CM | POA: Diagnosis not present

## 2020-10-10 MED ORDER — LORAZEPAM 2 MG/ML IJ SOLN
0.5000 mg | Freq: Once | INTRAMUSCULAR | Status: AC
  Administered 2020-10-10: 0.5 mg via INTRAVENOUS
  Filled 2020-10-10: qty 1

## 2020-10-10 MED ORDER — CLONAZEPAM 0.5 MG PO TABS
0.5000 mg | ORAL_TABLET | Freq: Two times a day (BID) | ORAL | Status: DC | PRN
Start: 2020-10-10 — End: 2020-10-12
  Administered 2020-10-10 – 2020-10-11 (×3): 0.5 mg via ORAL
  Filled 2020-10-10 (×2): qty 1

## 2020-10-10 MED ORDER — KETOROLAC TROMETHAMINE 15 MG/ML IJ SOLN
15.0000 mg | Freq: Once | INTRAMUSCULAR | Status: AC
  Administered 2020-10-10: 15 mg via INTRAVENOUS
  Filled 2020-10-10: qty 1

## 2020-10-10 NOTE — Social Work (Signed)
RE: Carmen [name] Date of Birth: 07/18/1941 Date: 10/10/20 To Whom It May Concern:  Please be advised that the above-named patient will require a short-term nursing home stay - anticipated 30 days or less for rehabilitation and strengthening.  The plan is for return home.

## 2020-10-10 NOTE — Progress Notes (Signed)
PROGRESS NOTE    Carmen [name]  OJJ:[id number] DOB: 1941/02/28 DOA: 10/06/2020 PCP: Patient, No Pcp Per  Brief Narrative: 80 year old female with history of atrial fibrillation, CVA in September 2021, depression, anxiety, bipolar disorder, after her stroke she moved in with one of her daughters in [place] who was unable to care for her after which she moved and stayed with another daughter for a few weeks, she had multiple falls and eventually one of her daughters from [place] drove over and brought her back with her 8 days ago. -Patient was initially managing her medications however daughter started doing this in the last 2 days, she noticed that patient was sleeping more than usual in the last 24 hours and slightly more confused.  No history of fevers, no cough congestion shortness of breath nausea vomiting or diarrhea -In the ED she was noted to be afebrile with normal vital signs, labs were unremarkable except for mild hyponatremia   Assessment & Plan:   Mild encephalopathy, FTT -Improved and stable -Patient remains awake alert, oriented to self and place, mild memory deficits and limited insight  -I suspect this is multifactorial in the setting of hyponatremia, recent stroke, polypharmacy namely lithium, narcotics, benzodiazepines etc. in the background of bipolar disorder -MRI brain without acute findings -Urinalysis unremarkable, ammonia level is normal, lithium level- normal -Sodium has normalized -TSH is mildly elevated at 10.7, suspect patient either not taking her meds appropriately or needed higher dosing, change Synthroid dose from 112 to 125 mcg daily, B12 is normal -PT OT eval completed, 24-hour supervision recommended -Family is unable to provide 24-hour supervision, SNF requested, social work consulted  Paroxysmal atrial fibrillation -Continue metoprolol and Eliquis, dose increased to 5 mg twice daily-based on her weight, GFR, age  History of  CVA -Continue Eliquis and statin, as above, aspirin discontinued  Hyperglycemia -Hemoglobin A1c is 5.8, consistent with borderline diabetes  Hypertension -Stable, continue amlodipine and benazepril  Hypothyroidism -TSH mildly elevated, unclear if she was taking Synthroid appropriately, dose increased to 125 mcg daily  Bipolar disorder Anxiety Depression -Continued on lithium, levels are okay -Klonopin resumed per home regimen  DVT prophylaxis: Eliquis Code Status: Full code Family Communication: No family at bedside, updated daughter. Disposition Plan:  Status is: Inpatient  Remains inpatient due to: Unsafe d/c plan patient needs higher level of care, daughter is unable to take care of her  Dispo: The patient is from: Home              Anticipated d/c is to: SNF              Anticipated d/c date is: Per [facility] team, awaiting insurance Auth & PASSAR              Patient currently is medically stable to d/c.  Consultants:   Procedures:   Antimicrobials:    Subjective: -No events overnight, remains pleasant, somewhat agreeable to consider rehab now  Objective: Vitals:   10/09/20 1757 10/09/20 2000 10/09/20 2034 10/10/20 0940  BP: 115/67 113/72 (!) 124/91 122/71  Pulse: 99 94 (!) 109 70  Resp: 16 18 18 16   Temp: 98.2 F (36.8 C) 98.6 F (37 C) 97.9 F (36.6 C) (!) 97.3 F (36.3 C)  TempSrc: Oral Oral Oral Oral  SpO2: 99%  99% 97%  Weight:      Height:        Intake/Output Summary (Last 24 hours) at 10/10/2020 1126 Last data filed at 10/10/2020 0900 Gross per 24  hour  Intake 700 ml  Output --  Net 700 ml   Filed Weights   10/06/20 1702 10/07/20 0001 10/08/20 2051  Weight: 77.1 kg 83.5 kg 82.1 kg    Examination:  General exam:   Pleasant elderly female sitting up in bed, awake alert oriented to self and place, cognitive deficits noted, less irritable CVS: S1-S2, regular rate rhythm Lungs: Decreased breath sounds at the bases otherwise clear sign  abdomen: Soft, nontender, bowel sounds present Extremities: No edema Neuro: Cranial nerves no deficits, moves all extremities, no localizing findings Psych: Poor insight and judgment  Data Reviewed:   CBC: Recent Labs  Lab 10/06/20 2202 10/07/20 0253 10/08/20 0823  WBC 9.2 9.3 9.8  NEUTROABS 6.1  --   --   HGB 10.8* 10.8* 10.8*  HCT 32.4* 33.5* 33.1*  MCV 88.8 87.9 88.0  PLT 256 256 264   Basic Metabolic Panel: Recent Labs  Lab 10/06/20 2202 10/07/20 0253 10/08/20 0823  NA 128* 136 137  K 3.6 3.9 3.7  CL 99 104 105  CO2 21* 23 20*  GLUCOSE 135* 101* 164*  BUN 9 7* 9  CREATININE 0.74 0.70 0.79  CALCIUM 8.4* 9.0 9.1   GFR: Estimated Creatinine Clearance: 63.9 mL/min (by C-G formula based on SCr of 0.79 mg/dL). Liver Function Tests: Recent Labs  Lab 10/06/20 2202 10/08/20 0823  AST 15 19  ALT 14 14  ALKPHOS 68 68  BILITOT 0.8 1.2  PROT 6.1* 6.4*  ALBUMIN 3.3* 3.3*   No results for input(s): LIPASE, AMYLASE in the last 168 hours. Recent Labs  Lab 10/06/20 1651  AMMONIA 19   Coagulation Profile: No results for input(s): INR, PROTIME in the last 168 hours. Cardiac Enzymes: No results for input(s): CKTOTAL, CKMB, CKMBINDEX, TROPONINI in the last 168 hours. BNP (last 3 results) No results for input(s): PROBNP in the last 8760 hours. HbA1C: Recent Labs    10/08/20 0823  HGBA1C 5.8*   CBG: No results for input(s): GLUCAP in the last 168 hours. Lipid Profile: No results for input(s): CHOL, HDL, LDLCALC, TRIG, CHOLHDL, LDLDIRECT in the last 72 hours. Thyroid Function Tests: No results for input(s): TSH, T4TOTAL, FREET4, T3FREE, THYROIDAB in the last 72 hours. Anemia Panel: No results for input(s): VITAMINB12, FOLATE, FERRITIN, TIBC, IRON, RETICCTPCT in the last 72 hours. Urine analysis:    Component Value Date/Time   COLORURINE YELLOW 10/07/2020 1029   APPEARANCEUR CLEAR 10/07/2020 1029   LABSPEC 1.010 10/07/2020 1029   PHURINE 8.0 10/07/2020 1029    GLUCOSEU NEGATIVE 10/07/2020 1029   HGBUR NEGATIVE 10/07/2020 1029   BILIRUBINUR NEGATIVE 10/07/2020 1029   KETONESUR NEGATIVE 10/07/2020 1029   PROTEINUR 30 (A) 10/07/2020 1029   NITRITE NEGATIVE 10/07/2020 1029   LEUKOCYTESUR NEGATIVE 10/07/2020 1029   Sepsis Labs: @LABRCNTIP (procalcitonin:4,lacticidven:4)  ) Recent Results (from the past 240 hour(s))  Resp Panel by RT-PCR (Flu A&B, Covid) Nasopharyngeal Swab     Status: None   Collection Time: 10/06/20  4:52 PM   Specimen: Nasopharyngeal Swab; Nasopharyngeal(NP) swabs in vial transport medium  Result Value Ref Range Status   SARS Coronavirus 2 by RT PCR NEGATIVE NEGATIVE Final    Comment: (NOTE) SARS-CoV-2 target nucleic acids are NOT DETECTED.  The SARS-CoV-2 RNA is generally detectable in upper respiratory specimens during the acute phase of infection. The lowest concentration of SARS-CoV-2 viral copies this assay can detect is 138 copies/mL. A negative result does not preclude SARS-Cov-2 infection and should not be used as the sole  basis for treatment or other patient management decisions. A negative result may occur with  improper specimen collection/handling, submission of specimen other than nasopharyngeal swab, presence of viral mutation(s) within the areas targeted by this assay, and inadequate number of viral copies(<138 copies/mL). A negative result must be combined with clinical observations, patient history, and epidemiological information. The expected result is Negative.  Fact Sheet for Patients:  BloggerCourse.com  Fact Sheet for Healthcare Providers:  SeriousBroker.it  This test is no t yet approved or cleared by the [place] FDA and  has been authorized for detection and/or diagnosis of SARS-CoV-2 by FDA under an Emergency Use Authorization (EUA). This EUA will remain  in effect (meaning this test can be used) for the duration of the COVID-19  declaration under Section 564(b)(1) of the Act, 21 U.S.C.section 360bbb-3(b)(1), unless the authorization is terminated  or revoked sooner.       Influenza A by PCR NEGATIVE NEGATIVE Final   Influenza B by PCR NEGATIVE NEGATIVE Final    Comment: (NOTE) The Xpert Xpress SARS-CoV-2/FLU/RSV plus assay is intended as an aid in the diagnosis of influenza from Nasopharyngeal swab specimens and should not be used as a sole basis for treatment. Nasal washings and aspirates are unacceptable for Xpert Xpress SARS-CoV-2/FLU/RSV testing.  Fact Sheet for Patients: BloggerCourse.com  Fact Sheet for Healthcare Providers: SeriousBroker.it  This test is not yet approved or cleared by the [place] FDA and has been authorized for detection and/or diagnosis of SARS-CoV-2 by FDA under an Emergency Use Authorization (EUA). This EUA will remain in effect (meaning this test can be used) for the duration of the COVID-19 declaration under Section 564(b)(1) of the Act, 21 U.S.C. section 360bbb-3(b)(1), unless the authorization is terminated or revoked.  Performed at [facility], [address] [place], [place] [address]        Scheduled Meds:  amLODipine  10 mg Oral Daily   And   benazepril  20 mg Oral Daily   apixaban  5 mg Oral BID   atorvastatin  20 mg Oral QHS   cilostazol  100 mg Oral BID   levothyroxine  125 mcg Oral QAC breakfast   lithium carbonate  300 mg Oral QHS   metoprolol succinate  100 mg Oral QHS   Continuous Infusions:   LOS: 3 days    Time spent:  Zannie Cove, MD [facility]  10/10/2020, 11:26 AM

## 2020-10-11 DIAGNOSIS — G9340 Encephalopathy, unspecified: Secondary | ICD-10-CM | POA: Diagnosis not present

## 2020-10-11 NOTE — Progress Notes (Signed)
Physical Therapy Treatment Patient Details Name: Carmen [name] MRN: [id number] DOB: 06/23/1941 Today's Date: 10/11/2020    History of Present Illness Carmen [name] is a 80 y.o. female with PMH including  CAD, Depression, Peripheral vascular disease ([place]), and stroke in September 2021 (right frontal and left cerebellar infarcts). Presented to ED with confusion, weakness, and trouble walking, frequent falls at least one where she hit her head.    PT Comments    Patient received in bed, cognition and command following much better today although she does seem to present with some possible STM deficits- at beginning of session told us she does not walk well without RW but was able to walk around just fine in room without assistive device. Able to progress mobility significantly on a min guard basis with RW, but does require intermittent cues and reminders for safety. Fatigued at [place]. Back pain improved significantly after mobility. Left up in recliner with all needs met, chair alarm active. Mobility wise could probably go home at this point, but would still recommend SNF if family cannot provide 24/7A.     Follow Up Recommendations  Home health PT;Supervision/Assistance - 24 hour;Other (comment) (SNF if family cannot provide 24/7A)     Equipment Recommendations  3in1 (PT);Rolling walker with 5" wheels    Recommendations for Other Services       Precautions / Restrictions Precautions Precautions: Fall Restrictions Weight Bearing Restrictions: No    Mobility  Bed Mobility Overal bed mobility: Needs Assistance Bed Mobility: Supine to Sit     Supine to sit: Min guard     General bed mobility comments: min guard, extended time  Transfers Overall transfer level: Needs assistance Equipment used: None Transfers: Sit to/from Stand Sit to Stand: Supervision         General transfer comment: able to perform sit to stand transfers with superivsion without RW, did need VC  for hand placement with RW as she tends to pull from it  Ambulation/Gait Ambulation/Gait assistance: Min guard Gait Distance (Feet): 200 Feet Assistive device: Rolling walker (2 wheeled) Gait Pattern/deviations: Step-to pattern;Decreased step length - right;Decreased step length - left;Decreased stance time - right;Decreased stance time - left;Decreased stride length;Drifts right/left Gait velocity: decreased   General Gait Details: balance much better today- able to progress gait distance in hallway with RW and safe/steady wtih device. Also able to walk around in her room with no device/general close S for safety   Stairs             Wheelchair Mobility    Modified Rankin (Stroke Patients Only)       Balance Overall balance assessment: Needs assistance Sitting-balance support: Bilateral upper extremity supported;Feet supported Sitting balance-[name]: Good     Standing balance support: No upper extremity supported;During functional activity Standing balance-[name]: Fair Standing balance comment: close S for safety                            Cognition Arousal/Alertness: Awake/alert Behavior During Therapy: Flat affect Overall Cognitive Status: Impaired/Different from baseline                     Current Attention Level: Sustained Memory: Decreased short-term memory;Decreased recall of precautions Following Commands: Follows one step commands inconsistently;Follows one step commands with increased time Safety/Judgement: Decreased awareness of safety;Decreased awareness of deficits Awareness: Intellectual Problem Solving: Difficulty sequencing;Requires verbal cues General Comments: cognition much better today but she  is still somewaht impulsive. Question if she is having some STM deficits as at first she told us she cannot walk well without RW then later in session popped right up and walked around room without it      Exercises       General Comments        Pertinent Vitals/Pain Pain Assessment: 0-10 Pain Score: 7  Pain Location: back pain Pain Descriptors / Indicators: Aching;Sore;Tightness Pain Intervention(s): Limited activity within patient's tolerance;Monitored during session;Repositioned    Home Living                      Prior Function            PT Goals (current goals can now be found in the care plan section) Acute Rehab PT Goals Patient Stated Goal: "get outta here!" PT Goal Formulation: With patient Time For Goal Achievement: 10/21/20 Potential to Achieve Goals: Fair Progress towards PT goals: Progressing toward goals    Frequency    Min 3X/week      PT Plan Discharge plan needs to be updated;Frequency needs to be updated;Equipment recommendations need to be updated    Co-evaluation              AM-PAC PT "6 Clicks" Mobility   Outcome Measure  Help needed turning from your back to your side while in a flat bed without using bedrails?: None Help needed moving from lying on your back to sitting on the side of a flat bed without using bedrails?: A Little Help needed moving to and from a bed to a chair (including a wheelchair)?: A Little Help needed standing up from a chair using your arms (e.g., wheelchair or bedside chair)?: A Little Help needed to walk in hospital room?: A Little Help needed climbing 3-5 steps with a railing? : A Little 6 Click Score: 19    End of Session Equipment Utilized During Treatment: Gait belt Activity Tolerance: Patient tolerated treatment well Patient left: in chair;with call bell/phone within reach;with chair alarm set Nurse Communication: Mobility status PT Visit Diagnosis: Unsteadiness on feet (R26.81);Difficulty in walking, not elsewhere classified (R26.2);Muscle weakness (generalized) (M62.81);History of falling (Z91.81)     Time: 7209-4709 PT Time Calculation (min) (ACUTE ONLY): 27 min  Charges:  $Gait Training: 8-22 mins  (co-tx with OT)                     Windell Norfolk, DPT, PN1   Supplemental Physical Therapist [place]    Pager [phone] Acute Rehab Office [phone]

## 2020-10-11 NOTE — Care Management Important Message (Signed)
Important Message  Patient Details  Name: Remie Mathison [place] MRN: [id number] Date of Birth: 01-08-1941   Medicare Important Message Given:  Yes     Oralia Rud Geneva Barrero 10/11/2020, 2:17 PM

## 2020-10-11 NOTE — TOC Progression Note (Signed)
Transition of Care [facility]) - Progression Note    Patient Details  Name: Carmen [name] MRN: [id number] Date of Birth: 24-Aug-1941  Transition of Care [facility]) CM/SW Contact  Okey Dupre Lazaro Arms, LCSW Phone Number: 10/11/2020, 6:15 PM  Clinical Narrative:  CSW talked with patient's daughter, Gerrit Heck [phone]) and provided bed offers. Her first choice is [facility] and second choice [facility] [place].  Call made to [facility], admissions liaison with [facility] regarding patient. Per Revonda Standard, both facilities have COVID patient at this time. Revonda Standard checked meds and asked about the PRN psych meds patient has received. Per [facility] consider PRN psychiatric meds as a restraint. Revonda Standard advised that CSW will f/u with MD regarding these medications.    Expected Discharge Plan: Skilled Nursing Facility Barriers to Discharge: SNF Pending bed offer,Continued Medical Work up  Expected Discharge Plan and Services Expected Discharge Plan: Skilled Nursing Facility     Post Acute Care Choice: Skilled Nursing Facility Living arrangements for the past 2 months: Single Family Home (Between multiple family members)                                       Social Determinants of Health (SDOH) Interventions    Readmission Risk Interventions No flowsheet data found.

## 2020-10-11 NOTE — Progress Notes (Signed)
Occupational Therapy Treatment Patient Details Name: Carmen [name] MRN: [id number] DOB: [birth year] Today's Date: 10/11/2020    History of present illness Carmen [name] is a 80 y.o. 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 Presented to ED with confusion, weakness, and trouble walking, frequent falls at least one where she hit her head.   OT comments  Pt progressing towards acute OT goals. Pt completed household distance functional mobility utilizing rw at min guard level. Min guard A for LB ADLs and functional mobility for safety. Decreased insight into deficits and decreased safety awareness noted. D/c plan remains appropriate.    Follow Up Recommendations  Supervision/Assistance - 24 hour;SNF    Equipment Recommendations  3 in 1 bedside commode    Recommendations for Other Services      Precautions / Restrictions Precautions Precautions: Fall Restrictions Weight Bearing Restrictions: No       Mobility Bed Mobility Overal bed mobility: Needs Assistance Bed Mobility: Supine to Sit     Supine to sit: Min guard     General bed mobility comments: min guard, extended time  Transfers Overall transfer level: Needs assistance Equipment used: None Transfers: Sit to/from Stand Sit to Stand: Supervision         General transfer comment: able to perform sit to stand transfers with superivsion without RW, did need VC for hand placement with RW as she tends to pull from it    Balance Overall balance assessment: Needs assistance Sitting-balance support: Bilateral upper extremity supported;Feet supported Sitting balance-[name]: Good     Standing balance support: No upper extremity supported;During functional activity Standing balance-[name]: Fair Standing balance comment: close S for safety                           ADL either  performed or assessed with clinical judgement   ADL Overall ADL's : Needs assistance/impaired                         Toilet Transfer: Min guard;Ambulation;RW           Functional mobility during ADLs: Min guard;Rolling walker General ADL Comments: Pt completed household distance functional mobility. Difficulty dual tasking at times. min guard A for OOB ADLs.     Vision       Perception     Praxis      Cognition Arousal/Alertness: Awake/alert Behavior During Therapy: Flat affect Overall Cognitive Status: Impaired/Different from baseline Area of Impairment: Orientation;Attention;Following commands;Safety/judgement;Awareness;Problem solving;Memory                 Orientation Level: Disoriented to;Place;Situation Current Attention Level: Sustained Memory: Decreased short-term memory;Decreased recall of precautions Following Commands: Follows one step commands inconsistently;Follows one step commands with increased time Safety/Judgement: Decreased awareness of safety;Decreased awareness of deficits Awareness: Intellectual Problem Solving: Difficulty sequencing;Requires verbal cues General Comments: cognition much better today but she is still somewaht impulsive. Question if she is having some STM deficits as at first she told us she cannot walk well without RW then later in session popped right up and walked around room without it        Exercises     Shoulder Instructions       General Comments      Pertinent Vitals/ Pain       Pain Assessment: 0-10 Pain Score: 7  Pain Location: back pain Pain Descriptors / Indicators: Aching;Sore;Tightness Pain Intervention(s): Limited activity within patient's tolerance;Monitored during session;Repositioned  Home Living                                          Prior Functioning/Environment              Frequency  Min 2X/week        Progress Toward Goals  OT Goals(current goals  can now be found in the care plan section)  Progress towards OT goals: Progressing toward goals  Acute Rehab OT Goals Patient Stated Goal: "get [place] here!" OT Goal Formulation: With patient Time For Goal Achievement: 10/21/20 Potential to Achieve Goals: Good ADL Goals Pt Will Perform Upper Body Dressing: with modified independence;sitting Pt Will Perform Lower Body Dressing: with min guard assist;sit to/from stand Pt Will Transfer to Toilet: with supervision;ambulating  Plan Discharge plan needs to be updated    Co-evaluation    PT/OT/SLP Co-Evaluation/Treatment: Yes Reason for Co-Treatment: For patient/therapist safety;Necessary to address cognition/behavior during functional activity   OT goals addressed during session: ADL's and self-care      AM-PAC OT "6 Clicks" Daily Activity     Outcome Measure   Help from another person eating meals?: None Help from another person taking care of personal grooming?: A Little Help from another person toileting, which includes using toliet, bedpan, or urinal?: A Little Help from another person bathing (including washing, rinsing, drying)?: A Little Help from another person to put on and taking off regular upper body clothing?: None Help from another person to put on and taking off regular lower body clothing?: A Little 6 Click Score: 20    End of Session Equipment Utilized During Treatment: Gait belt;Rolling walker  OT Visit Diagnosis: Unsteadiness on feet (R26.81);Other abnormalities of gait and mobility (R26.89);Muscle weakness (generalized) (M62.81);Other symptoms and signs involving cognitive function   Activity Tolerance Patient tolerated treatment well   Patient Left in chair;with call bell/phone within reach;with chair alarm set   Nurse Communication          Time: [id number] OT Time Calculation (min): 27 min  Charges: OT General Charges $OT Visit: 1 Visit OT Treatments $Self Care/Home Management : 8-22  mins  Raynald Kemp, OT Acute Rehabilitation Services Pager: [phone] Office: [phone]    Pilar Grammes 10/11/2020, 2:04 PM

## 2020-10-11 NOTE — Progress Notes (Signed)
PROGRESS NOTE    Carmen [name]  UUE:[id number] DOB: 08-Jul-1941 DOA: 10/06/2020 PCP: Patient, No Pcp Per  Brief Narrative: 80 year old female with history of atrial fibrillation, CVA in September 2021, depression, anxiety, bipolar disorder, after her stroke she moved in with one of her daughters in [place] who was unable to care for her after which she moved and stayed with another daughter for a few weeks, she had multiple falls and eventually one of her daughters from [place] drove over and brought her back with her 8 days ago. -Patient was initially managing her medications however daughter started doing this in the last 2 days, she noticed that patient was sleeping more than usual in the last 24 hours and slightly more confused.  No history of fevers, no cough congestion shortness of breath nausea vomiting or diarrhea -In the ED she was noted to be afebrile with normal vital signs, labs were unremarkable except for mild hyponatremia   Assessment & Plan:   Mild encephalopathy, FTT -Improved and stable -Patient remains awake alert, oriented to self and place, mild memory deficits and limited insight and some balance deficits -I suspect this is multifactorial in the setting of hyponatremia, recent stroke, polypharmacy namely lithium, narcotics, benzodiazepines etc. in the background of bipolar disorder -MRI brain without acute findings -Urinalysis unremarkable, ammonia level is normal, lithium level- normal -Sodium has normalized -TSH is mildly elevated at 10.7, suspect patient either not taking her meds appropriately or needed higher dosing, changed Synthroid dose from 112 to 125 mcg daily, B12 is normal -PT OT eval completed, 24-hour supervision recommended -Family is unable to provide 24-hour supervision, SNF requested, social work consulted, continue discharge planning, she remains medically stable for rehab  Paroxysmal atrial fibrillation -Continue metoprolol and  Eliquis, dose increased to 5 mg twice daily-based on her weight, GFR, age  History of CVA -Continue Eliquis and statin, as above, aspirin discontinued  Hyperglycemia -Hemoglobin A1c is 5.8, consistent with borderline diabetes  Hypertension -Stable, continue amlodipine and benazepril  Hypothyroidism -TSH mildly elevated, unclear if she was taking Synthroid appropriately, dose increased to 125 mcg daily  Bipolar disorder Anxiety Depression -Continued on lithium, levels are okay -Klonopin resumed per home regimen  DVT prophylaxis: Eliquis Code Status: Full code Family Communication: No family at bedside, updated daughter. Disposition Plan:  Status is: Inpatient  Remains inpatient due to: Unsafe d/c plan patient needs higher level of care, daughter is unable to take care of her  Dispo: The patient is from: Home              Anticipated d/c is to: SNF              Anticipated d/c date is: Per [facility] team, awaiting insurance Auth & PASSAR              Patient currently is medically stable to d/c.  Consultants:   Procedures:   Antimicrobials:    Subjective: -Feels okay, no events overnight, upset about occasional disagreements with staff  Objective: Vitals:   10/10/20 1802 10/10/20 2005 10/11/20 0523 10/11/20 0927  BP: 128/82 (!) 136/97 113/75 126/78  Pulse: 76 98 88 72  Resp: 14  18 16   Temp: 98.9 F (37.2 C) 98.7 F (37.1 C) 98.2 F (36.8 C) 98.9 F (37.2 C)  TempSrc: Oral Oral Oral Oral  SpO2: 95% 97% 99% 99%  Weight:      Height:        Intake/Output Summary (Last 24 hours) at 10/11/2020  1152 Last data filed at 10/11/2020 0900 Gross per 24 hour  Intake 537 ml  Output --  Net 537 ml   Filed Weights   10/06/20 1702 10/07/20 0001 10/08/20 2051  Weight: 77.1 kg 83.5 kg 82.1 kg    Examination:  General exam:   Pleasant elderly female sitting up in the recliner, awake alert oriented to self and place, cognitive deficits noted, less irritable CVS: S1-S2,  regular rate rhythm Lungs: Decreased breath sounds to bases, otherwise clear Abdomen: Soft, nontender, bowel sounds present Extremities: No edema  Neuro: Cranial nerves no deficits, moves all extremities, no localizing findings Psych: Poor insight and judgment  Data Reviewed:   CBC: Recent Labs  Lab 10/06/20 2202 10/07/20 0253 10/08/20 0823  WBC 9.2 9.3 9.8  NEUTROABS 6.1  --   --   HGB 10.8* 10.8* 10.8*  HCT 32.4* 33.5* 33.1*  MCV 88.8 87.9 88.0  PLT 256 256 264   Basic Metabolic Panel: Recent Labs  Lab 10/06/20 2202 10/07/20 0253 10/08/20 0823  NA 128* 136 137  K 3.6 3.9 3.7  CL 99 104 105  CO2 21* 23 20*  GLUCOSE 135* 101* 164*  BUN 9 7* 9  CREATININE 0.74 0.70 0.79  CALCIUM 8.4* 9.0 9.1   GFR: Estimated Creatinine Clearance: 63.9 mL/min (by C-G formula based on SCr of 0.79 mg/dL). Liver Function Tests: Recent Labs  Lab 10/06/20 2202 10/08/20 0823  AST 15 19  ALT 14 14  ALKPHOS 68 68  BILITOT 0.8 1.2  PROT 6.1* 6.4*  ALBUMIN 3.3* 3.3*   No results for input(s): LIPASE, AMYLASE in the last 168 hours. Recent Labs  Lab 10/06/20 1651  AMMONIA 19   Coagulation Profile: No results for input(s): INR, PROTIME in the last 168 hours. Cardiac Enzymes: No results for input(s): CKTOTAL, CKMB, CKMBINDEX, TROPONINI in the last 168 hours. BNP (last 3 results) No results for input(s): PROBNP in the last 8760 hours. HbA1C: No results for input(s): HGBA1C in the last 72 hours. CBG: No results for input(s): GLUCAP in the last 168 hours. Lipid Profile: No results for input(s): CHOL, HDL, LDLCALC, TRIG, CHOLHDL, LDLDIRECT in the last 72 hours. Thyroid Function Tests: No results for input(s): TSH, T4TOTAL, FREET4, T3FREE, THYROIDAB in the last 72 hours. Anemia Panel: No results for input(s): VITAMINB12, FOLATE, FERRITIN, TIBC, IRON, RETICCTPCT in the last 72 hours. Urine analysis:    Component Value Date/Time   COLORURINE YELLOW 10/07/2020 1029   APPEARANCEUR  CLEAR 10/07/2020 1029   LABSPEC 1.010 10/07/2020 1029   PHURINE 8.0 10/07/2020 1029   GLUCOSEU NEGATIVE 10/07/2020 1029   HGBUR NEGATIVE 10/07/2020 1029   BILIRUBINUR NEGATIVE 10/07/2020 1029   KETONESUR NEGATIVE 10/07/2020 1029   PROTEINUR 30 (A) 10/07/2020 1029   NITRITE NEGATIVE 10/07/2020 1029   LEUKOCYTESUR NEGATIVE 10/07/2020 1029   Sepsis Labs: @LABRCNTIP (procalcitonin:4,lacticidven:4)  ) Recent Results (from the past 240 hour(s))  Resp Panel by RT-PCR (Flu A&B, Covid) Nasopharyngeal Swab     Status: None   Collection Time: 10/06/20  4:52 PM   Specimen: Nasopharyngeal Swab; Nasopharyngeal(NP) swabs in vial transport medium  Result Value Ref Range Status   SARS Coronavirus 2 by RT PCR NEGATIVE NEGATIVE Final    Comment: (NOTE) SARS-CoV-2 target nucleic acids are NOT DETECTED.  The SARS-CoV-2 RNA is generally detectable in upper respiratory specimens during the acute phase of infection. The lowest concentration of SARS-CoV-2 viral copies this assay can detect is 138 copies/mL. A negative result does not preclude SARS-Cov-2 infection  and should not be used as the sole basis for treatment or other patient management decisions. A negative result may occur with  improper specimen collection/handling, submission of specimen other than nasopharyngeal swab, presence of viral mutation(s) within the areas targeted by this assay, and inadequate number of viral copies(<138 copies/mL). A negative result must be combined with clinical observations, patient history, and epidemiological information. The expected result is Negative.  Fact Sheet for Patients:  BloggerCourse.com  Fact Sheet for Healthcare Providers:  SeriousBroker.it  This test is no t yet approved or cleared by the [place] FDA and  has been authorized for detection and/or diagnosis of SARS-CoV-2 by FDA under an Emergency Use Authorization (EUA). This EUA will  remain  in effect (meaning this test can be used) for the duration of the COVID-19 declaration under Section 564(b)(1) of the Act, 21 U.S.C.section 360bbb-3(b)(1), unless the authorization is terminated  or revoked sooner.       Influenza A by PCR NEGATIVE NEGATIVE Final   Influenza B by PCR NEGATIVE NEGATIVE Final    Comment: (NOTE) The Xpert Xpress SARS-CoV-2/FLU/RSV plus assay is intended as an aid in the diagnosis of influenza from Nasopharyngeal swab specimens and should not be used as a sole basis for treatment. Nasal washings and aspirates are unacceptable for Xpert Xpress SARS-CoV-2/FLU/RSV testing.  Fact Sheet for Patients: BloggerCourse.com  Fact Sheet for Healthcare Providers: SeriousBroker.it  This test is not yet approved or cleared by the [place] FDA and has been authorized for detection and/or diagnosis of SARS-CoV-2 by FDA under an Emergency Use Authorization (EUA). This EUA will remain in effect (meaning this test can be used) for the duration of the COVID-19 declaration under Section 564(b)(1) of the Act, 21 U.S.C. section 360bbb-3(b)(1), unless the authorization is terminated or revoked.  Performed at [facility], [address] [place], [place] [address]        Scheduled Meds:  amLODipine  10 mg Oral Daily   And   benazepril  20 mg Oral Daily   apixaban  5 mg Oral BID   atorvastatin  20 mg Oral QHS   cilostazol  100 mg Oral BID   levothyroxine  125 mcg Oral QAC breakfast   lithium carbonate  300 mg Oral QHS   metoprolol succinate  100 mg Oral QHS   Continuous Infusions:   LOS: 4 days    Time spent:  Zannie Cove, MD [facility]  10/11/2020, 11:52 AM

## 2020-10-12 DIAGNOSIS — G9340 Encephalopathy, unspecified: Secondary | ICD-10-CM | POA: Diagnosis not present

## 2020-10-12 DIAGNOSIS — F319 Bipolar disorder, unspecified: Secondary | ICD-10-CM | POA: Insufficient documentation

## 2020-10-12 DIAGNOSIS — F339 Major depressive disorder, recurrent, unspecified: Secondary | ICD-10-CM | POA: Insufficient documentation

## 2020-10-12 DIAGNOSIS — I6990 Unspecified sequelae of unspecified cerebrovascular disease: Secondary | ICD-10-CM | POA: Insufficient documentation

## 2020-10-12 DIAGNOSIS — R627 Adult failure to thrive: Secondary | ICD-10-CM | POA: Insufficient documentation

## 2020-10-12 DIAGNOSIS — W19XXXA Unspecified fall, initial encounter: Secondary | ICD-10-CM | POA: Insufficient documentation

## 2020-10-12 LAB — SARS CORONAVIRUS 2 (TAT 6-24 HRS): SARS Coronavirus 2: NEGATIVE

## 2020-10-12 MED ORDER — APIXABAN 5 MG PO TABS: 5.0000 mg | ORAL_TABLET | Freq: Two times a day (BID) | ORAL | Status: DC

## 2020-10-12 MED ORDER — HYDROCODONE-ACETAMINOPHEN 10-325 MG PO TABS: 1.0000 | ORAL_TABLET | Freq: Three times a day (TID) | ORAL | 0 refills | Status: DC | PRN

## 2020-10-12 MED ORDER — LEVOTHYROXINE SODIUM 125 MCG PO TABS: 125.0000 ug | ORAL_TABLET | Freq: Every day | ORAL | Status: DC

## 2020-10-12 MED ORDER — CLONAZEPAM 0.5 MG PO TABS
0.5000 mg | ORAL_TABLET | Freq: Two times a day (BID) | ORAL | Status: DC
  Administered 2020-10-12: 0.5 mg via ORAL
  Filled 2020-10-12: qty 1

## 2020-10-12 MED ORDER — CLONAZEPAM 0.5 MG PO TABS: 0.5000 mg | ORAL_TABLET | Freq: Two times a day (BID) | ORAL | 0 refills | Status: DC

## 2020-10-12 NOTE — Discharge Summary (Signed)
Physician Discharge Summary  Carmen [name] NWG:[id number] DOB: [birth year] DOA: 10/06/2020  PCP: Patient, No Pcp Per  Admit date: 10/06/2020 Discharge date: 10/12/2020  Time spent:  Recommendations for Outpatient Follow-up:  1. PCP in 1 week, please check lithium level in 2 weeks 2. SNF for rehab   Discharge Diagnoses:  Principal Problem:   Acute encephalopathy Mild hyponatremia Recent stroke Bipolar disorder Hypothyroidism Frequent falls Paroxysmal atrial fibrillation   CVA (cerebral vascular accident) (HCC)   Atrial flutter (HCC)   Depression   PVD (peripheral vascular disease) (HCC)   Hypothyroidism   CAD (coronary artery disease)   Discharge Condition: Stable  Diet recommendation: Heart healthy  Filed Weights   10/06/20 1702 10/07/20 0001 10/08/20 2051  Weight: 77.1 kg 83.5 kg 82.1 kg    History of present illness:  80 year old female with history of atrial fibrillation, 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 -Patient was initially managing her medications however daughter started doing this in the last 2 days, she noticed that patient was sleeping more than usual in the last 24 hours and slightly more confused.  No history of fevers, no cough congestion shortness of breath nausea vomiting or diarrhea -In the ED she was noted to be afebrile with normal vital signs, labs were unremarkable except for mild hyponatremia  Hospital Course:   Mild encephalopathy, falls, FTT -Improved and stable now, daughter reported history of falls, and slight increased confusion in the 24 hours prior to admission -Patient remains awake alert,  oriented to self and place, mild memory deficits,  limited insight and some balance deficits -I suspect this is multifactorial in the setting of hyponatremia, recent stroke, polypharmacy namely lithium, narcotics, benzodiazepines etc. in the background of bipolar disorder (reportedly started on lithium 2 months ago in [place]) -MRI brain without acute findings -Urinalysis unremarkable, ammonia level is normal, lithium level- normal -Sodium has normalized -TSH is mildly elevated at 10.7, suspect patient either not taking her meds appropriately or needed higher dosing, changed Synthroid dose from 112 to 125 mcg daily, B12 is normal -PT OT eval completed, 24-hour supervision recommended -Family is unable to provide 24-hour supervision, SNF requested, social work consulted, continue discharge planning, she remains medically stable for rehab  Paroxysmal atrial fibrillation -Continue metoprolol and Eliquis, dose increased to 5 mg twice daily-based on her weight, GFR, age  History of CVA -Continue Eliquis and statin, as above, aspirin discontinued  Hyperglycemia -Hemoglobin A1c is 5.8, consistent with borderline diabetes  Hypertension -Stable, continue amlodipine and benazepril  Hypothyroidism -TSH mildly elevated, unclear if she was taking Synthroid appropriately, dose increased to 125 mcg daily  Bipolar disorder Anxiety Depression -Continued on lithium, levels are okay -Klonopin resumed per home regimen  Discharge Exam: Vitals:   10/12/20 0920 10/12/20 0951  BP: 111/68 115/76  Pulse: 72 76  Resp: 18 16  Temp: 98.6 F (37 C) 98.2 F (36.8 C)  SpO2: 97% 96%    General: Awake alert x3, mild cognitive deficits Cardiovascular: S1-S2, regular rate rhythm Respiratory: Clear bilaterally  Discharge Instructions   Discharge Instructions    Diet - low sodium heart healthy   Complete by: As directed  Increase activity slowly   Complete by: As directed      Allergies as of  10/12/2020   No Known Allergies     Medication List    STOP taking these medications   aspirin EC 81 MG tablet     TAKE these medications   amLODipine-benazepril 10-20 MG capsule Commonly known as: LOTREL Take 1 capsule by mouth daily.   apixaban 5 MG Tabs tablet Commonly known as: ELIQUIS Take 1 tablet (5 mg total) by mouth 2 (two) times daily. What changed: how much to take   atorvastatin 20 MG tablet Commonly known as: LIPITOR Take 20 mg by mouth at bedtime.   cilostazol 100 MG tablet Commonly known as: PLETAL Take 100 mg by mouth 2 (two) times daily.   clonazePAM 0.5 MG tablet Commonly known as: KLONOPIN Take 1 tablet (0.5 mg total) by mouth 2 (two) times daily. What changed: when to take this   docusate sodium 100 MG capsule Commonly known as: COLACE Take 300 mg by mouth daily as needed for mild constipation.   HYDROcodone-acetaminophen 10-325 MG tablet Commonly known as: NORCO Take 1 tablet by mouth 3 (three) times daily as needed for moderate pain.   levothyroxine 125 MCG tablet Commonly known as: SYNTHROID Take 1 tablet (125 mcg total) by mouth daily before breakfast. What changed:   medication strength  how much to take   lithium carbonate 300 MG capsule Take 300 mg by mouth at bedtime.   metoprolol succinate 100 MG 24 hr tablet Commonly known as: TOPROL-XL Take 100 mg by mouth at bedtime. Take with or immediately following a meal.      No Known Allergies    The results of significant diagnostics from this hospitalization (including imaging, microbiology, ancillary and laboratory) are listed below for reference.    Significant Diagnostic Studies: DG Forearm Right  Result Date: 10/07/2020 CLINICAL DATA:  Pain and bruising. EXAM: RIGHT FOREARM - 2 VIEW COMPARISON:  No prior. FINDINGS: Soft tissue swelling. Tiny bony density noted adjacent to the ulnar epicondyle of the distal humerus. This may be from prior ligamentous injury. No acute bony or  joint abnormality otherwise noted. No evidence of elbow joint effusion. IMPRESSION: Soft tissue swelling. Tiny bony density noted adjacent to the ulnar epicondyle of the distal humerus. This may be from prior ligamentous injury. No acute bony or joint abnormality otherwise noted. Electronically Signed   By: Maisie Fushomas  Register   On: 10/07/2020 07:19   MR BRAIN WO CONTRAST  Result Date: 10/06/2020 CLINICAL DATA:  Initial evaluation for acute dizziness, mental status change. EXAM: MRI HEAD WITHOUT CONTRAST TECHNIQUE: Multiplanar, multiecho pulse sequences of the brain and surrounding structures were obtained without intravenous contrast. COMPARISON:  Level none available. FINDINGS: Brain: Examination severely limited as the patient was unable to tolerate the full length of the exam. Axial coronal DWI sequences only were performed. Additionally, images provided are markedly degraded by motion artifact. Diffusion-weighted imaging demonstrates no evidence for acute or subacute ischemia. Areas of encephalomalacia involving the parasagittal right frontal lobe and left cerebellum likely reflect remote infarcts, incompletely assessed on this limited exam. No definite mass lesion, mass effect, or midline shift. No hydrocephalus or visible extra-axial fluid collection. Vascular: Not assessed on this technically limited exam. Skull and upper cervical spine: Not assessed on this technically limited exam. Sinuses/Orbits: Not assessed on this technically limited exam. Other: Negative. IMPRESSION: 1. Technically limited exam due to the patient's inability to tolerate the full length of the study as  well as extensive motion artifact. Diffusion-weighted sequences only were performed. No evidence for acute or subacute ischemia. 2. No other definite acute intracranial abnormality. 3. 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 Electronically Signed   By: Rise Mu  M.D.   On: 10/06/2020 19:11   DG CHEST PORT 1 VIEW  Result Date: 10/06/2020 CLINICAL DATA:  Altered mental status EXAM: PORTABLE CHEST 1 VIEW COMPARISON:  None. FINDINGS: The lungs are symmetrically well expanded and are clear. No pneumothorax or pleural effusion. Coronary artery bypass grafting has been performed. Mild cardiomegaly is present. The pulmonary vascularity is normal. IMPRESSION: No active disease.  Mild cardiomegaly. Electronically Signed   By: Helyn Numbers MD   On: 10/06/2020 22:52   DG HIP PORT UNILAT WITH PELVIS 1V LEFT  Result Date: 10/07/2020 CLINICAL DATA:  Fall, left hip pain EXAM: DG HIP (WITH OR WITHOUT PELVIS) 1V PORT LEFT COMPARISON:  None. FINDINGS: There is no evidence of hip fracture or dislocation. There is no evidence of arthropathy or other focal bone abnormality. IMPRESSION: Negative. Electronically Signed   By: Helyn Numbers MD   On: 10/07/2020 02:37    Microbiology: Recent Results (from the past 240 hour(s))  Resp Panel by RT-PCR (Flu A&B, Covid) Nasopharyngeal Swab     Status: None   Collection Time: 10/06/20  4:52 PM   Specimen: Nasopharyngeal Swab; Nasopharyngeal(NP) swabs in vial transport medium  Result Value Ref Range Status   SARS Coronavirus 2 by RT PCR NEGATIVE NEGATIVE Final    Comment: (NOTE) SARS-CoV-2 target nucleic acids are NOT DETECTED.  The SARS-CoV-2 RNA is generally detectable in upper respiratory specimens during the acute phase of infection. The lowest concentration of SARS-CoV-2 viral copies this assay can detect is 138 copies/mL. A negative result does not preclude SARS-Cov-2 infection and should not be used as the sole basis for treatment or other patient management decisions. A negative result may occur with  improper specimen collection/handling, submission of specimen other than nasopharyngeal swab, presence of viral mutation(s) within the areas targeted by this assay, and inadequate number of viral copies(<138 copies/mL). A  negative result must be combined with clinical observations, patient history, and epidemiological information. The expected result is Negative.  Fact Sheet for Patients:  BloggerCourse.com  Fact Sheet for Healthcare Providers:  SeriousBroker.it  This test is no t yet approved or cleared by the [place] FDA and  has been authorized for detection and/or diagnosis of SARS-CoV-2 by FDA under an Emergency Use Authorization (EUA). This EUA will remain  in effect (meaning this test can be used) for the duration of the COVID-19 declaration under Section 564(b)(1) of the Act, 21 U.S.C.section 360bbb-3(b)(1), unless the authorization is terminated  or revoked sooner.       Influenza A by PCR NEGATIVE NEGATIVE Final   Influenza B by PCR NEGATIVE NEGATIVE Final    Comment: (NOTE) The Xpert Xpress SARS-CoV-2/FLU/RSV plus assay is intended as an aid in the diagnosis of influenza from Nasopharyngeal swab specimens and should not be used as a sole basis for treatment. Nasal washings and aspirates are unacceptable for Xpert Xpress SARS-CoV-2/FLU/RSV testing.  Fact Sheet for Patients: BloggerCourse.com  Fact Sheet for Healthcare Providers: SeriousBroker.it  This test is not yet approved or cleared by the [place] FDA and has been authorized for detection and/or diagnosis of SARS-CoV-2 by FDA under an Emergency Use Authorization (EUA). This EUA will remain in effect (meaning this test can be used)  for the duration of the COVID-19 declaration under Section 564(b)(1) of the Act, 21 U.S.C. section 360bbb-3(b)(1), unless the authorization is terminated or revoked.  Performed at [facility], [address], [place], [place] [address]      Labs: Basic Metabolic Panel: Recent Labs  Lab 10/06/20 2202 10/07/20 0253 10/08/20 0823  NA 128* 136 137  K 3.6 3.9 3.7  CL 99 104 105   CO2 21* 23 20*  GLUCOSE 135* 101* 164*  BUN 9 7* 9  CREATININE 0.74 0.70 0.79  CALCIUM 8.4* 9.0 9.1   Liver Function Tests: Recent Labs  Lab 10/06/20 2202 10/08/20 0823  AST 15 19  ALT 14 14  ALKPHOS 68 68  BILITOT 0.8 1.2  PROT 6.1* 6.4*  ALBUMIN 3.3* 3.3*   No results for input(s): LIPASE, AMYLASE in the last 168 hours. Recent Labs  Lab 10/06/20 1651  AMMONIA 19   CBC: Recent Labs  Lab 10/06/20 2202 10/07/20 0253 10/08/20 0823  WBC 9.2 9.3 9.8  NEUTROABS 6.1  --   --   HGB 10.8* 10.8* 10.8*  HCT 32.4* 33.5* 33.1*  MCV 88.8 87.9 88.0  PLT 256 256 264   Cardiac Enzymes: No results for input(s): CKTOTAL, CKMB, CKMBINDEX, TROPONINI in the last 168 hours. BNP: BNP (last 3 results) No results for input(s): BNP in the last 8760 hours.  ProBNP (last 3 results) No results for input(s): PROBNP in the last 8760 hours.  CBG: No results for input(s): GLUCAP in the last 168 hours.     Signed:  Zannie Cove MD.  [facility] 10/12/2020, 11:13 AM

## 2020-10-12 NOTE — Progress Notes (Signed)
   10/12/20 4818  Unsuccessful Nursing Procedure/Treatment  Type of Nursing Procedure/Treatment Peripheral IV insertion  Number of attempts  (IV team came up  but pt. refused.)

## 2020-10-12 NOTE — Progress Notes (Signed)
DISCHARGE NOTE SNF [facility] to be discharged Skilled nursing facility per MD order. Patient verbalized understanding.  Skin clean, dry and intact without evidence of skin break down, no evidence of skin tears noted. IV catheter discontinued intact. Site without signs and symptoms of complications. Dressing and pressure applied. Pt denies pain at the site currently. No complaints noted.  Patient free of lines, drains, and wounds.   Discharge packet assembled. An After Visit Summary (AVS) was printed and given to the EMS personnel. Patient escorted via stretcher and discharged to [facility] via ambulance. Report called to accepting facility; all questions and concerns addressed.   Leonia Reeves, RN, BSN

## 2020-10-12 NOTE — Progress Notes (Signed)
Called report to [facility].   Leonia Reeves, RN, BSN

## 2020-11-18 ENCOUNTER — Other Ambulatory Visit: Payer: Self-pay

## 2020-11-18 ENCOUNTER — Encounter: Payer: Self-pay | Admitting: Adult Health

## 2020-11-18 ENCOUNTER — Ambulatory Visit (INDEPENDENT_AMBULATORY_CARE_PROVIDER_SITE_OTHER): Payer: Medicare Other | Admitting: Adult Health

## 2020-11-18 VITALS — BP 92/61 | HR 72 | Temp 97.9°F | Resp 18 | Ht 61.0 in | Wt 184.6 lb

## 2020-11-18 DIAGNOSIS — I480 Paroxysmal atrial fibrillation: Secondary | ICD-10-CM

## 2020-11-18 DIAGNOSIS — R296 Repeated falls: Secondary | ICD-10-CM | POA: Insufficient documentation

## 2020-11-18 DIAGNOSIS — I25810 Atherosclerosis of coronary artery bypass graft(s) without angina pectoris: Secondary | ICD-10-CM

## 2020-11-18 DIAGNOSIS — I4892 Unspecified atrial flutter: Secondary | ICD-10-CM | POA: Diagnosis not present

## 2020-11-18 DIAGNOSIS — Z8673 Personal history of transient ischemic attack (TIA), and cerebral infarction without residual deficits: Secondary | ICD-10-CM | POA: Diagnosis not present

## 2020-11-18 DIAGNOSIS — E538 Deficiency of other specified B group vitamins: Secondary | ICD-10-CM

## 2020-11-18 DIAGNOSIS — E039 Hypothyroidism, unspecified: Secondary | ICD-10-CM

## 2020-11-18 DIAGNOSIS — I739 Peripheral vascular disease, unspecified: Secondary | ICD-10-CM

## 2020-11-18 DIAGNOSIS — G8929 Other chronic pain: Secondary | ICD-10-CM | POA: Insufficient documentation

## 2020-11-18 NOTE — Progress Notes (Signed)
New patient visit   Patient: Carmen [name]   DOB: 1941-04-02   80 y.o. Female  MRN: [id number] Visit Date: 11/18/2020  Today's healthcare provider: Jairo Ben, FNP   Chief Complaint  Patient presents with  . New Patient (Initial Visit)   Subjective    Carmen [name] is a 80 y.o. female who presents today as a new patient to establish care.  She is accompanied by her daughter who gives history, patient does report some history as well. HPI  Patient presents in office today establish care, she is present in office today with her daughter. Patients daughter reports that patient moved in with her a month ago from [place], she states since the beginning of 2022 patient has fallen 20-30x.   Patients daughter would like to discuss today placing patient in Independent living at [facility] and would like to discuss rehab/physical therapy for patient.   Patients daughter states that patient has a history of chronic back pain band vitamin B12 deficiency.   Patients daughter reports that patient was receiving weekly B12 injections but since moving in with her injections have stopped  . Patient complains of memory loss, fatigue and dizziness, daughter feels that B12 deficiency could be contributing to patients symptoms along with low iron count. 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 Balance has been off for 2- 3 years per daughter. She has had 6 stents cardiac she reports proximal LAD per stent card patient has.   Since September has had 20 falls. She moved here from [place] in December.  Prior to that she was living in [place] per daughter.  Patients daughter states she has been mixing Pedialyte and lemonade daily for patient due to general weakness since being seen at the   Patient states that she would like to address concerns of  depression that she describes as severe and anxiety, patients daughter states that patient currently is on Klonopin to treat for anxiety but patient states that medication does not seem to be helping and states " I just want to go home."  Daughter reports confusion has improved some since being home from the hospital.  However patient has had multiple falls.  She is on blood thinner Eliquis.    Occasionally urinary incontinence.  TSH in Emergency room was 10. She was not taking Synthroid at that time.  Synthroid was increased to 125 mcg p.o. daily at the hospital on 10/06/2020 - 10/02/2020.  Hospital summary reported that patient was discharged to skilled nursing facility, however patient has been living with her daughter Carmen [name] who is with her at the visit today. She has since started on it since emergency room visit.  She was seeing pain management in [facility]. Is currently on Hydrocodone.   Patient denies any concerns or complaints, she denies chest pain, shortness of breath.  She does endorse weakness, but reports she has always like this.  Reviewed the below  from hospital admission on 10/06/2020. CLINICAL DATA:  Altered mental status  EXAM: PORTABLE CHEST 1 VIEW COMPARISON:  None.  FINDINGS: The lungs are symmetrically well expanded and are clear. No pneumothorax or pleural effusion. Coronary artery bypass grafting has been performed. Mild cardiomegaly is present. The pulmonary vascularity is normal.  IMPRESSION: No active disease.  Mild cardiomegaly.  Electronically Signed   By: Gloris Ham  Ramiro Harvest MD   On: 10/06/2020 22:52 MRI brain WO contrast  IMPRESSION: 1. Technically limited exam due to the patient's inability to tolerate the full length of the study as well as extensive motion artifact. Diffusion-weighted sequences only were performed. No evidence for acute or subacute ischemia. 2. No other definite acute intracranial abnormality. 3. 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   Electronically Signed   By: Rise Mu M.D.   On: 10/06/2020 19:11   Past Medical History:  Diagnosis Date  . Allergy   . Anemia   . Anxiety   . Arthritis   . Coronary artery disease   . Depression   . Peripheral vascular disease (HCC)   . Stroke (HCC)   . Thyroid disease    Past Surgical History:  Procedure Laterality Date  . CARDIAC CATHETERIZATION    . CORONARY ANGIOPLASTY    . CORONARY ARTERY BYPASS GRAFT     Family Status  Relation Name Status  . Mother  (Not Specified)  . Daughter  (Not Specified)  . Father  (Not Specified)  . Neg Hx  (Not Specified)   Family History  Problem Relation Age of Onset  . CAD Mother   . Breast cancer Mother   . Heart Problems Mother   . Bipolar disorder Daughter   . Heart Problems Father   . Diabetes Mellitus II Neg Hx    Social History   Socioeconomic History  . Marital status: Widowed    Spouse name: Not on file  . Number of children: Not on file  . Years of education: Not on file  . Highest education level: Not on file  Occupational History  . Not on file  Tobacco Use  . Smoking status: Current Some Day Smoker  . Smokeless tobacco: Never Used  Substance and Sexual Activity  . Alcohol use: Not Currently  . Drug use: Not on file  . Sexual activity: Not on file  Other Topics Concern  . Not on file  Social History Narrative   ** Merged History Encounter **       Social Determinants of Health   Financial Resource Strain: Not on file  Food Insecurity: Not on file  Transportation Needs: Not on file  Physical Activity: Not on file  Stress: Not on file  Social Connections: Not on file   Outpatient Medications Prior to Visit  Medication Sig Note  . amLODipine-benazepril (LOTREL) 10-20 MG capsule Take 1 capsule by mouth daily.   Marland Kitchen apixaban (ELIQUIS) 5 MG TABS tablet Take 1 tablet (5 mg total) by mouth 2 (two) times daily.   Marland Kitchen  atorvastatin (LIPITOR) 20 MG tablet Take 20 mg by mouth at bedtime.   . cilostazol (PLETAL) 100 MG tablet Take 100 mg by mouth 2 (two) times daily.   . clonazePAM (KLONOPIN) 0.5 MG tablet Take 1 tablet (0.5 mg total) by mouth 2 (two) times daily. 11/18/2020: Patient reports 1 tablet in the morning  . docusate sodium (COLACE) 100 MG capsule Take 300 mg by mouth daily as needed for mild constipation.   Marland Kitchen gemfibrozil (LOPID) 600 MG tablet    . HYDROcodone-acetaminophen (NORCO) 10-325 MG tablet Take 1 tablet by mouth 3 (three) times daily as needed for moderate pain.   Marland Kitchen levothyroxine (SYNTHROID) 125 MCG tablet Take 1 tablet (125 mcg total) by mouth daily before breakfast. 11/18/2020: Patient reports daily  . metoprolol succinate (TOPROL-XL) 100 MG 24 hr tablet Take  100 mg by mouth at bedtime. Take with or immediately following a meal.   . vitamin B-12 (CYANOCOBALAMIN) 500 MCG tablet Take 500 mcg by mouth daily at 6 (six) AM. 11/18/2020: Patient reports one tablet M,W,F  . lithium carbonate 300 MG capsule Take 300 mg by mouth at bedtime. (Patient not taking: Reported on 11/18/2020)    No facility-administered medications prior to visit.   No Known Allergies   There is no immunization history on file for this patient.  Health Maintenance  Topic Date Due  . Hepatitis C Screening  Never done  . COVID-19 Vaccine (1) Never done  . TETANUS/TDAP  Never done  . DEXA SCAN  Never done  . PNA vac Low Risk Adult (1 of 2 - PCV13) Never done  . INFLUENZA VACCINE  11/18/2020 (Originally 05/02/2020)    Patient Care Team: [name], [name], FNP as PCP - General (Family Medicine) Patient, No Pcp Per (General Practice)  Review of Systems  Constitutional: Positive for fatigue.  HENT: Positive for postnasal drip and sinus pressure.   Gastrointestinal: Positive for constipation.  Musculoskeletal: Positive for back pain, neck pain and neck stiffness.  Allergic/Immunologic: Positive for environmental  allergies.  Neurological: Positive for weakness and light-headedness.  Psychiatric/Behavioral: Positive for confusion, decreased concentration and sleep disturbance. The patient is nervous/anxious.   All other systems reviewed and are negative.   Last CBC Lab Results  Component Value Date   WBC 9.8 10/08/2020   HGB 10.8 (L) 10/08/2020   HCT 33.1 (L) 10/08/2020   MCV 88.0 10/08/2020   MCH 28.7 10/08/2020   RDW 15.8 (H) 10/08/2020   PLT 264 10/08/2020   Last metabolic panel Lab Results  Component Value Date   GLUCOSE 164 (H) 10/08/2020   NA 137 10/08/2020   K 3.7 10/08/2020   CL 105 10/08/2020   CO2 20 (L) 10/08/2020   BUN 9 10/08/2020   CREATININE 0.79 10/08/2020   GFRNONAA >60 10/08/2020   CALCIUM 9.1 10/08/2020   PROT 6.4 (L) 10/08/2020   ALBUMIN 3.3 (L) 10/08/2020   BILITOT 1.2 10/08/2020   ALKPHOS 68 10/08/2020   AST 19 10/08/2020   ALT 14 10/08/2020   ANIONGAP 12 10/08/2020   Last lipids No results found for: CHOL, HDL, LDLCALC, LDLDIRECT, TRIG, CHOLHDL Last hemoglobin A1c Lab Results  Component Value Date   HGBA1C 5.8 (H) 10/08/2020   Last thyroid functions Lab Results  Component Value Date   TSH 10.765 (H) 10/06/2020   T4TOTAL 5.6 10/06/2020   Last vitamin D No results found for: 25OHVITD2, 25OHVITD3, VD25OH Last vitamin B12 and Folate Lab Results  Component Value Date   VITAMINB12 229 10/07/2020      Objective    BP 92/61   Pulse 72 Comment: ekg  Temp 97.9 F (36.6 C) (Oral)   Resp 18   Ht 5\' 1"  (1.549 m)   Wt 184 lb 9.6 oz (83.7 kg)   SpO2 94%   BMI 34.88 kg/m  Physical Exam Constitutional:      General: She is not in acute distress.    Appearance: Normal appearance. She is obese. She is not ill-appearing, toxic-appearing or diaphoretic.  HENT:     Head: Normocephalic and atraumatic.     Right Ear: External ear normal.     Left Ear: External ear normal.     Mouth/Throat:     Pharynx: Oropharynx is clear.  Eyes:     General: No  scleral icterus.    Conjunctiva/sclera: Conjunctivae normal.  Cardiovascular:     Rate and Rhythm: Rhythm irregular.     Comments: Irregular rate variable from 37 in office to 189  pulse oximetry.  EKG atrial/ fibrillations.  Pulmonary:     Effort: Pulmonary effort is normal. No respiratory distress.     Breath sounds: Normal breath sounds. No stridor. No wheezing, rhonchi or rales.  Chest:     Chest wall: No tenderness.  Abdominal:     General: There is no distension.     Palpations: Abdomen is soft.     Tenderness: There is no abdominal tenderness.  Musculoskeletal:     Cervical back: Normal range of motion. No rigidity or tenderness.     Right lower leg: Edema present.     Left lower leg: Edema present.  Lymphadenopathy:     Cervical: No cervical adenopathy.  Skin:    General: Skin is warm.     Findings: No rash.  Neurological:     Mental Status: She is alert and oriented to person, place, and time.     Gait: Gait normal.     Deep Tendon Reflexes: Reflexes normal.  Psychiatric:        Mood and Affect: Mood normal.        Behavior: Behavior normal.        Thought Content: Thought content normal.        Judgment: Judgment normal.    Depression Screen PHQ 2/9 Scores 11/18/2020  PHQ - 2 Score 6  PHQ- 9 Score 15   No results found for any visits on 11/18/20.  Assessment & Plan      Paroxysmal atrial fibrillation (HCC)  Atrial flutter, unspecified type (HCC) - Plan: EKG 12-Lead, Ambulatory referral to Cardiology  Multiple falls - Plan: AMB Referral to [facility] Coordinaton, Comprehensive Metabolic Panel (CMET), Lithium level, POCT Urinalysis Dip Manual, TSH, B12 and Folate Panel, CBC with Differential/Platelet, Ambulatory referral to Neurology  History of CVA (cerebrovascular accident)-2021 reported.  - Plan: Ambulatory referral to Neurology, Ambulatory referral to Cardiology  PVD (peripheral vascular disease) (HCC) - Plan: Ambulatory referral to  Cardiology  Other chronic pain - Plan: Ambulatory referral to Pain Clinic  Hypothyroidism, unspecified type - Plan: TSH  B12 deficiency - Plan: B12 and Folate Panel  Coronary artery disease involving other coronary artery bypass graft without angina pectoris EKG reviewed atrial fibrillation/flutter, patient's heart rate is irregular irregular and variable in the room.  Advise daughter Carmen [name] and patient that she needed to go to the emergency room now, could go via 911.  However they both declined at this time patient is adamant she is not going back to the emergency room.  Reviewed that leaving would be AGAINST MEDICAL ADVICE and that this is not what provider was advising patient to do.  Patient should be seen immediately by cardiology in the hospital, lack of treatment or medical procedures that may be needed for patient's condition could result in heart damage, worsening irregular heartbeat, bradycardia tachycardia or even at worst-case death.  Patient and daughter verbalized understanding.  Patient again declined, urgent referral was placed to cardiology, also overnight by Dr. Okey Dupre who agrees with the emergency room now however patient and daughter declined at this time.  Addressed with daughter my concerns of her being on Pletal and Eliquis given the number of falls that she has had. Also addressed with the daughter that not comfortable changing medications at this office visit in the hospital would be best now for a evaluation and stat  labs.  Other referrals and lab orders were placed to help quicken patient's care when she returns to next visit.  Return in about 1 week (around 11/25/2020), or if symptoms worsen or fail to improve, for at any time for any worsening symptoms, Go to Emergency room/ urgent care if worse.      After visit summary was given to patient and daughter. Advise daughter to take patient back to the emergency room now  today 11/18/2020 for further evaluation and work  up  as heart rate in office variable from 37 to 180's in room. EKG shows atrial fib/ flutter.   Follow up with PCP after hospital.  Labs and referrals were initiated.  The entirety of the information documented in the History of Present Illness, Review of Systems and Physical Exam were personally obtained by me. Portions of this information were initially documented by the CMA and reviewed by me for thoroughness and accuracy.    Addressed acute and or chronic medical problems today requiring over 62  minutes reviewing patients medical record,labs, counseling patient regarding patient's conditions, any medications, answering questions regarding health, and coordination of care as needed. After visit summary patient given copy and reviewed.  Jairo BenMichelle Smith [name], FNP  [facility] [phone] (phone) [phone] (fax)  [facility]

## 2020-11-18 NOTE — Progress Notes (Signed)
Given variable heart rate  in room, no previous history/medical records and increased falls/ dizziness advised daughter Sherry Ruffing to take her mother to the emergency room now.

## 2020-11-18 NOTE — Patient Instructions (Signed)
Advise daughter to take patient back to the emergency room now  today 11/18/2020 for further evaluation and work up  as heart rate in office variable from 37 to 180's in room. EKG shows atrial fib/ flutter.   Follow up with PCP after hospital.  Labs and referrals were initiated.     Atrial Flutter  Atrial flutter is a type of abnormal heart rhythm (arrhythmia). The heart has an electrical system that tells it how to beat. In atrial flutter, the signals move rapidly in the top chambers of the heart (the atria). This makes your heart beat very fast. Atrial flutter can come and go, or it can be permanent. The goal of treatment is to prevent blood clots from forming, control your heart rate, or restore your heartbeat to a normal rhythm. If this condition is not treated, it can cause serious problems, such as a weakened heart muscle (cardiomyopathy) or a stroke. What are the causes? This condition is often caused by conditions that damage the heart's electrical system. These include:  Heart conditions and heart surgery. These include heart attacks and open-heart surgery.  Lung problems, such as COPD or a blood clot in the lung (pulmonary embolism, or PE).  Poorly controlled high blood pressure (hypertension).  Overactive thyroid (hyperthyroidism).  Diabetes. In some cases, the cause of this condition is not known. What increases the risk? You are more likely to develop this condition if:  You are an elderly adult.  You are a man.  You are overweight (obese).  You have obstructive sleep apnea.  You have a family history of atrial flutter.  You have diabetes.  You drink a lot of alcohol, especially binge drinking.  You use drugs, including cannabis.  You smoke. What are the signs or symptoms? Symptoms of this condition include:  A feeling that your heart is pounding or racing (palpitations).  Shortness of breath.  Chest pain.  Feeling dizzy or  light-headed.  Fainting.  Low blood pressure (hypotension).  Fatigue.  Tiring easily during exercise or activity. In some cases, there are no symptoms. How is this diagnosed? This condition may be diagnosed with:  An electrocardiogram (ECG) to check electrical signals of the heart.  An ambulatory cardiac monitor to record your heart's activity for a few days.  An echocardiogram to create pictures of your heart.  A transesophageal echocardiogram (TEE) to create even better pictures of your heart.  A stress test to check your blood supply while you exercise.  Imaging tests, such as a CT scan or chest X-ray.  Blood tests. How is this treated? Treatment depends on underlying conditions and how you feel when you experience atrial flutter. This condition may be treated with:  Medicines to prevent blood clots or to treat heart rate or heart rhythm problems.  Electrical cardioversion to reset the heart's rhythm.  Ablation to remove the heart tissue that sends abnormal signals.  Left atrial appendage closure to seal the area where blood clots can form. In some cases, underlying conditions will be treated. Follow these instructions at home: Medicines  Take over-the-counter and prescription medicines only as told by your health care provider.  Do not take any new medicines without talking to your health care provider.  If you are taking blood thinners: ? Talk with your health care provider before you take any medicines that contain aspirin or NSAIDs, such as ibuprofen. These medicines increase your risk for dangerous bleeding. ? Take your medicine exactly as told, at the same time  every day. ? Avoid activities that could cause injury or bruising, and follow instructions about how to prevent falls. ? Wear a medical alert bracelet or carry a card that lists what medicines you take. Lifestyle  Eat heart-healthy foods. Talk with a dietitian to make an eating plan that is right for  you.  Do not use any products that contain nicotine or tobacco, such as cigarettes, e-cigarettes, and chewing tobacco. If you need help quitting, ask your health care provider.  Do not drink alcohol.  Do not use drugs, including cannabis.  Lose weight if you are overweight or obese.  Exercise regularly as instructed by your health care provider. General instructions  Do not use diet pills unless your health care provider approves. Diet pills may make heart problems worse.  If you have obstructive sleep apnea, manage your condition as told by your health care provider.  Keep all follow-up visits as told by your health care provider. This is important. Contact a health care provider if you:  Notice a change in the rate, rhythm, or strength of your heartbeat.  Are taking a blood thinner and you notice more bruising.  Have a sudden change in weight.  Tire more easily when you exercise or do heavy work. Get help right away if you have:  Pain or pressure in your chest.  Shortness of breath.  Fainting.  Increasing sweating with no known cause.  Side effects of blood thinners, such as blood in your vomit, stool, or urine, or bleeding that cannot stop.  Any symptoms of a stroke. "BE FAST" is an easy way to remember the main warning signs of a stroke: ? B - Balance. Signs are dizziness, sudden trouble walking, or loss of balance. ? E - Eyes. Signs are trouble seeing or a sudden change in vision. ? F - Face. Signs are sudden weakness or numbness of the face, or the face or eyelid drooping on one side. ? A - Arms. Signs are weakness or numbness in an arm. This happens suddenly and usually on one side of the body. ? S - Speech. Signs are sudden trouble speaking, slurred speech, or trouble understanding what people say. ? T - Time. Time to call emergency services. Write down what time symptoms started.  Other signs of a stroke, such as: ? A sudden, severe headache with no known  cause. ? Nausea or vomiting. ? Seizure.  These symptoms may represent a serious problem that is an emergency. Do not wait to see if the symptoms will go away. Get medical help right away. Call your local emergency services (911 in the [place] Do not drive yourself to the hospital. Summary  Atrial flutter is an abnormal heart rhythm that can give you symptoms of palpitations, shortness of breath, or fatigue.  Atrial flutter is often treated with medicines to keep your heart in a normal rhythm and to prevent a stroke.  Get help right away if you cannot catch your breath, or have chest pain or pressure.  Get help right away if you have signs or symptoms of a stroke. This information is not intended to replace advice given to you by your health care provider. Make sure you discuss any questions you have with your health care provider. Document Revised: 03/12/2019 Document Reviewed: 03/12/2019 Elsevier Patient Education  2021 [employer] Atrial Fibrillation  Atrial fibrillation is a type of heartbeat that is irregular or fast. If you have this condition, your heart beats without any order. This makes  it hard for your heart to pump blood in a normal way. Atrial fibrillation may come and go, or it may become a long-lasting problem. If this condition is not treated, it can put you at higher risk for stroke, heart failure, and other heart problems. What are the causes? This condition may be caused by diseases that damage the heart. They include:  High blood pressure.  Heart failure.  Heart valve disease.  Heart surgery. Other causes include:  Diabetes.  Thyroid disease.  Being overweight.  Kidney disease. Sometimes the cause is not known. What increases the risk? You are more likely to develop this condition if:  You are older.  You smoke.  You exercise often and very hard.  You have a family history of this condition.  You are a man.  You use drugs.  You drink a lot  of alcohol.  You have lung conditions, such as emphysema, pneumonia, or COPD.  You have sleep apnea. What are the signs or symptoms? Common symptoms of this condition include:  A feeling that your heart is beating very fast.  Chest pain or discomfort.  Feeling short of breath.  Suddenly feeling light-headed or weak.  Getting tired easily during activity.  Fainting.  Sweating. In some cases, there are no symptoms. How is this treated? Treatment for this condition depends on underlying conditions and how you feel when you have atrial fibrillation. They include:  Medicines to: ? Prevent blood clots. ? Treat heart rate or heart rhythm problems.  Using devices, such as a pacemaker, to correct heart rhythm problems.  Doing surgery to remove the part of the heart that sends bad signals.  Closing an area where clots can form in the heart (left atrial appendage). In some cases, your doctor will treat other underlying conditions. Follow these instructions at home: Medicines  Take over-the-counter and prescription medicines only as told by your doctor.  Do not take any new medicines without first talking to your doctor.  If you are taking blood thinners: ? Talk with your doctor before you take any medicines that have aspirin or NSAIDs, such as ibuprofen, in them. ? Take your medicine exactly as told by your doctor. Take it at the same time each day. ? Avoid activities that could hurt or bruise you. Follow instructions about how to prevent falls. ? Wear a bracelet that says you are taking blood thinners. Or, carry a card that lists what medicines you take. Lifestyle  Do not use any products that have nicotine or tobacco in them. These include cigarettes, e-cigarettes, and chewing tobacco. If you need help quitting, ask your doctor.  Eat heart-healthy foods. Talk with your doctor about the right eating plan for you.  Exercise regularly as told by your doctor.  Do not drink  alcohol.  Lose weight if you are overweight.  Do not use drugs, including cannabis.      General instructions  If you have a condition that causes breathing to stop for a short period of time (apnea), treat it as told by your doctor.  Keep a healthy weight. Do not use diet pills unless your doctor says they are safe for you. Diet pills may make heart problems worse.  Keep all follow-up visits as told by your doctor. This is important. Contact a doctor if:  You notice a change in the speed, rhythm, or strength of your heartbeat.  You are taking a blood-thinning medicine and you get more bruising.  You get tired more  easily when you move or exercise.  You have a sudden change in weight. Get help right away if:  You have pain in your chest or your belly (abdomen).  You have trouble breathing.  You have side effects of blood thinners, such as blood in your vomit, poop (stool), or pee (urine), or bleeding that cannot stop.  You have any signs of a stroke. "BE FAST" is an easy way to remember the main warning signs: ? B - Balance. Signs are dizziness, sudden trouble walking, or loss of balance. ? E - Eyes. Signs are trouble seeing or a change in how you see. ? F - Face. Signs are sudden weakness or loss of feeling in the face, or the face or eyelid drooping on one side. ? A - Arms. Signs are weakness or loss of feeling in an arm. This happens suddenly and usually on one side of the body. ? S - Speech. Signs are sudden trouble speaking, slurred speech, or trouble understanding what people say. ? T - Time. Time to call emergency services. Write down what time symptoms started.  You have other signs of a stroke, such as: ? A sudden, very bad headache with no known cause. ? Feeling like you may vomit (nausea). ? Vomiting. ? A seizure. These symptoms may be an emergency. Do not wait to see if the symptoms will go away. Get medical help right away. Call your local emergency services  (911 in the [place] Do not drive yourself to the hospital.   Summary  Atrial fibrillation is a type of heartbeat that is irregular or fast.  You are at higher risk of this condition if you smoke, are older, have diabetes, or are overweight.  Follow your doctor's instructions about medicines, diet, exercise, and follow-up visits.  Get help right away if you have signs or symptoms of a stroke.  Get help right away if you cannot catch your breath, or you have chest pain or discomfort. This information is not intended to replace advice given to you by your health care provider. Make sure you discuss any questions you have with your health care provider. Document Revised: 03/12/2019 Document Reviewed: 03/12/2019 Elsevier Patient Education  2021 [employer].

## 2020-11-19 ENCOUNTER — Ambulatory Visit (INDEPENDENT_AMBULATORY_CARE_PROVIDER_SITE_OTHER): Payer: Medicare Other

## 2020-11-19 ENCOUNTER — Telehealth: Payer: Self-pay | Admitting: *Deleted

## 2020-11-19 DIAGNOSIS — R296 Repeated falls: Secondary | ICD-10-CM

## 2020-11-19 DIAGNOSIS — I480 Paroxysmal atrial fibrillation: Secondary | ICD-10-CM

## 2020-11-19 DIAGNOSIS — I25810 Atherosclerosis of coronary artery bypass graft(s) without angina pectoris: Secondary | ICD-10-CM

## 2020-11-19 NOTE — Chronic Care Management (AMB) (Signed)
  Chronic Care Management   Note  11/19/2020 Name: [name] MRN: [id number] DOB: [birth year]  Carmen [name] is a 80 y.o. year old female who is a primary care patient of [name], Kelby Aline, FNP. I reached out to [employer] by phone today in response to a referral sent by Ms. Carmen Jarvis Joyce's PCP Laverna Peace S,FNP  [name] was given information about Chronic Care Management services today including:  1. CCM service includes personalized support from designated clinical staff supervised by her physician, including individualized plan of care and coordination with other care providers 2. 24/7 contact phone numbers for assistance for urgent and routine care needs. 3. Service will only be billed when office clinical staff spend 20 minutes or more in a month to coordinate care. 4. Only one practitioner may furnish and bill the service in a calendar month. 5. The patient may stop CCM services at any time (effective at the end of the month) by phone call to the office staff. 6. The patient will be responsible for cost sharing (co-pay) of up to 20% of the service fee (after annual deductible is met).  Daughter,[name],[name] DPR on file verbally agreed to assistance and services provided by embedded care coordination/care management team today.  Follow up plan: Telephone appointment with care management team member scheduled for:11/19/20   [facility]

## 2020-11-19 NOTE — Chronic Care Management (AMB) (Signed)
Chronic Care Management   Initial Visit Note  11/19/2020 Name: Carmen [name] MRN: [id number] DOB: 1940/12/03  Primary Care Provider: Doreen Beam, FNP Reason for referral : Chronic Care Management   Carmen [name] is a 80 y.o. year old female who is a primary care patient of [name], Kelby Aline, FNP. The CCM team was consulted for assistance with chronic disease management and care coordination. A telephonic outreach was conducted today with her caregiver/daughter Carmen [name].   Review of Mrs. Gunnarson's status, including review of consultants reports, relevant labs and test results was conducted today. Collaboration with appropriate care team members was performed as part of the comprehensive evaluation and provision of chronic care management services.    SDOH (Social Determinants of Health) assessments performed: Yes See Care Plan activities for detailed interventions related to SDOH  SDOH Interventions   Flowsheet Row Most Recent Value  SDOH Interventions   Food Insecurity Interventions Intervention Not Indicated  Transportation Interventions Intervention Not Indicated  Alcohol Brief Interventions/Follow-up AUDIT Score <7 follow-up not indicated  Depression Interventions/Treatment  Medication, Currently on Treatment  [Per daughter, patient has experiencing anxiety/depression. Denied concerns regarding violent behavior or suicidal ideation.]       Medications: Outpatient Encounter Medications as of 11/19/2020  Medication Sig Note  . amLODipine-benazepril (LOTREL) 10-20 MG capsule Take 1 capsule by mouth daily.   Marland Kitchen apixaban (ELIQUIS) 5 MG TABS tablet Take 1 tablet (5 mg total) by mouth 2 (two) times daily.   Marland Kitchen atorvastatin (LIPITOR) 20 MG tablet Take 20 mg by mouth at bedtime.   . cilostazol (PLETAL) 100 MG tablet Take 100 mg by mouth 2 (two) times daily.   . clonazePAM (KLONOPIN) 0.5 MG tablet Take 1 tablet (0.5 mg total) by mouth 2 (two) times daily. 11/18/2020:  Patient reports 1 tablet in the morning  . docusate sodium (COLACE) 100 MG capsule Take 300 mg by mouth daily as needed for mild constipation.   Marland Kitchen gemfibrozil (LOPID) 600 MG tablet    . HYDROcodone-acetaminophen (NORCO) 10-325 MG tablet Take 1 tablet by mouth 3 (three) times daily as needed for moderate pain.   Marland Kitchen levothyroxine (SYNTHROID) 125 MCG tablet Take 1 tablet (125 mcg total) by mouth daily before breakfast. 11/18/2020: Patient reports 172mg daily  . lithium carbonate 300 MG capsule Take 300 mg by mouth at bedtime. (Patient not taking: Reported on 11/18/2020)   . metoprolol succinate (TOPROL-XL) 100 MG 24 hr tablet Take 100 mg by mouth at bedtime. Take with or immediately following a meal.   . vitamin B-12 (CYANOCOBALAMIN) 500 MCG tablet Take 500 mcg by mouth daily at 6 (six) AM. 11/18/2020: Patient reports one tablet M,W,F   No facility-administered encounter medications on file as of 11/19/2020.     Objective: Patient Care Plan: Coronary Artery Disease and Persistent A-Fib    Problem Identified: Disease Progression (CAD and Persistent A-Fib)     Long-Range Goal: Disease Progression Prevented or Minimized   Start Date: 11/19/2020  Expected End Date: 03/19/2021  Priority: High  Note:   Current Barriers:  . Chronic Disease Management support and educational needs r/t CAD and Persistent A-Fib  Case Manager Clinical Goal(s):  .Marland KitchenOver the next 120 days, patient will not require hospitalization or emergent care d/t complications r/t cardiac disease.  Interventions:  . Collaboration with [name], MKelby Aline FNP regarding development and update of comprehensive plan of care as evidenced by provider attestation and co-signature . Inter-disciplinary care team collaboration (see longitudinal plan of care) .  Reviewed medications with patient's daughter/caregiver. Confirmed that patient has all needed prescriptions. Encouraged to ensure she takes medications as prescribed and notify the  provider if she is unable to tolerate the regimen. Encouraged to notify the care management team with concerns regarding medication management or prescription cost. . Discussed nutritional intake and importance of adhering to a cardiac prudent/heart healthy diet. . Discussed current treatment plan. Discussed established parameters for BP and pulse along with indications for notifying a provider. Encouraged to monitor and record readings. Provided information regarding worsening s/sx that require immediate medical attention. Daughter reports monitoring her frequently. No complaints of chest pain, palpitations or shortness of breath since being in the daughter's home. Reports her mobility is limited by an unsteady gait but no complaints of fatigue or decreased activity tolerance. Advised to assess daily and notify provider with concerns. She is pending outreach with the Cardiology team.   Patient Goals/Self-Care Activities: Over the next 120 days, patient's caregiver will: -Ensure patient takes all medications as prescribed -Ensure patient adheres to a cardiac prudent/heart healthy diet -Monitor patient's BP and pulse and assess for symptoms r/t cardiac complications -Ensure patient completes outreach with the Cardiology team as scheduled -Notify provider or care management team with questions and new concerns as needed    Follow Up Plan:  Will follow up next month   Patient Care Plan: Functional Decline/Impaired Mobility    Problem Identified: Functional Decline     Long-Range Goal: Maintain Mobility and Function   Start Date: 11/19/2020  Expected End Date: 03/19/2021  Priority: High  Note:   Fall Risk  11/19/2020 11/18/2020 11/18/2020  Falls in the past year? 1 1 0  Number falls in past yr: 1 1 0  Comment - 20-30 -  Injury with Fall? 1 1 0  Risk for fall due to : History of fall(s);Mental status change;Impaired balance/gait;Impaired mobility;Medication side effect History of fall(s);Mental  status change;Medication side effect;Impaired balance/gait -  Follow up Falls prevention discussed - -     Current Barriers:  . High Risk for Falls r/t Impaired balance and Chronic back pain . Unable to perform ADLs independently . Unable to perform IADLs independently  Clinical Goal(s):  Marland Kitchen Over the next 120 days, patient will not experience falls or require emergent care d/t fall related injuries.  Interventions:  . Collaboration with [name], Kelby Aline, FNP regarding development and update of comprehensive plan of care as evidenced by provider attestation and co-signature . Inter-disciplinary care team collaboration (see longitudinal plan of care) . Reviewed medications with caregiver and discussed potential side effects of such as lightheadedness, dizziness and frequent urination. Encouraged to promptly notify provider if patient seems unable to tolerate the prescribed regimen. . Provided information regarding safety and fall prevention measures. Reports patient uses a walker when ambulating. Also has a bedside commode to use as needed. Encouraged to ensure assistive devices are readily available to use when needed. Daughter confirmed that patient is never left alone and family members are currently assisting with providing care. . Discussed plan for Home Health services. Discussed with the primary care provider today. Team agrees that patient will benefit from nursing, therapy and home aide services. Will follow up to confirm start of services once referral is submitted.   Self-Care Deficits/Patient Goals:  Over the next 120 days, caregiver will: -Ensure patient utilizes assistive device appropriately with all ambulation -Ensure pathways are clear and well lit -Ensure patient wears secure fitting non skid footwear when ambulating -Contact provider or care management  team with questions and new concerns as needed   Follow Up Plan:  Will follow up next month     Ms. Lopiccolo's  daughter/caregiver Carmen [name] was given information about Chronic Care Management services including:  1. CCM service includes personalized support from designated clinical staff supervised by her physician, including individualized plan of care and coordination with other care providers 2. 24/7 contact phone numbers for assistance for urgent and routine care needs. 3. Service will only be billed when office clinical staff spend 20 minutes or more in a month to coordinate care. 4. Only one practitioner may furnish and bill the service in a calendar month. 5. The patient may stop CCM services at any time (effective at the end of the month) by phone call to the office staff. 6. The patient will be responsible for cost sharing (co-pay) of up to 20% of the service fee (after annual deductible is met).  Patient's daughter/caregiver Carmen [name] agreed to services. Verbal consent obtained.      PLAN A member of the care management team will follow up within the next month.    [facility] [phone]

## 2020-11-23 ENCOUNTER — Other Ambulatory Visit: Payer: Self-pay | Admitting: Adult Health

## 2020-11-23 DIAGNOSIS — E039 Hypothyroidism, unspecified: Secondary | ICD-10-CM

## 2020-11-23 DIAGNOSIS — F32A Depression, unspecified: Secondary | ICD-10-CM

## 2020-11-23 DIAGNOSIS — F419 Anxiety disorder, unspecified: Secondary | ICD-10-CM

## 2020-11-23 LAB — COMPREHENSIVE METABOLIC PANEL
ALT: 10 IU/L (ref 0–32)
AST: 15 IU/L (ref 0–40)
Albumin/Globulin Ratio: 1.7 (ref 1.2–2.2)
Albumin: 4.3 g/dL (ref 3.7–4.7)
Alkaline Phosphatase: 65 IU/L (ref 44–121)
BUN/Creatinine Ratio: 13 (ref 12–28)
BUN: 11 mg/dL (ref 8–27)
Bilirubin Total: 0.3 mg/dL (ref 0.0–1.2)
CO2: 19 mmol/L — ABNORMAL LOW (ref 20–29)
Calcium: 9.5 mg/dL (ref 8.7–10.3)
Chloride: 101 mmol/L (ref 96–106)
Creatinine, Ser: 0.88 mg/dL (ref 0.57–1.00)
GFR calc Af Amer: 73 mL/min/{1.73_m2} (ref >59)
GFR calc non Af Amer: 63 mL/min/{1.73_m2} (ref >59)
Globulin, Total: 2.5 g/dL (ref 1.5–4.5)
Glucose: 89 mg/dL (ref 65–99)
Potassium: 4.9 mmol/L (ref 3.5–5.2)
Sodium: 137 mmol/L (ref 134–144)
Total Protein: 6.8 g/dL (ref 6.0–8.5)

## 2020-11-23 LAB — CBC WITH DIFFERENTIAL/PLATELET
Basophils Absolute: 0 10*3/uL (ref 0.0–0.2)
Basos: 1 %
EOS (ABSOLUTE): 0.2 10*3/uL (ref 0.0–0.4)
Eos: 4 %
Hematocrit: 36.6 % (ref 34.0–46.6)
Hemoglobin: 11.8 g/dL (ref 11.1–15.9)
Immature Grans (Abs): 0 10*3/uL (ref 0.0–0.1)
Immature Granulocytes: 0 %
Lymphocytes Absolute: 1.5 10*3/uL (ref 0.7–3.1)
Lymphs: 25 %
MCH: 29.5 pg (ref 26.6–33.0)
MCHC: 32.2 g/dL (ref 31.5–35.7)
MCV: 92 fL (ref 79–97)
Monocytes Absolute: 0.6 10*3/uL (ref 0.1–0.9)
Monocytes: 10 %
Neutrophils Absolute: 3.8 10*3/uL (ref 1.4–7.0)
Neutrophils: 60 %
Platelets: 267 10*3/uL (ref 150–450)
RBC: 4 x10E6/uL (ref 3.77–5.28)
RDW: 13.2 % (ref 11.7–15.4)
WBC: 6.2 10*3/uL (ref 3.4–10.8)

## 2020-11-23 LAB — LITHIUM LEVEL: Lithium Lvl: <0.1 mmol/L — ABNORMAL LOW (ref 0.5–1.2)

## 2020-11-23 LAB — B12 AND FOLATE PANEL
Folate: 10.7 ng/mL (ref >3.0)
Vitamin B-12: 475 pg/mL (ref 232–1245)

## 2020-11-23 LAB — TSH: TSH: 1.27 u[IU]/mL (ref 0.450–4.500)

## 2020-11-23 MED ORDER — LEVOTHYROXINE SODIUM 100 MCG PO TABS: 100.0000 ug | ORAL_TABLET | Freq: Every day | ORAL | 3 refills | Status: DC

## 2020-11-23 NOTE — Progress Notes (Signed)
CBC, TSH, B12, Folate,  is within normal limits. B12 and Folate within normal limits.  Lithium level ok, below normal she is seen by psychiatry for this.  CMP ok.   TSH did drop suddenly from hospital visit, will decrease synthroid from 125 mcg to 100 mcg daily before breakfast and recheck in 3- 4 weeks TSH.   She was in variable rate atrial fib / flutter and was advised emergency room at last office visit, advised to go to the emergency room still for care.

## 2020-11-23 NOTE — Progress Notes (Signed)
Orders Placed This Encounter  Procedures  . Ambulatory referral to Psychiatry

## 2020-11-23 NOTE — Progress Notes (Signed)
Meds ordered this encounter  Medications  . levothyroxine (SYNTHROID) 100 MCG tablet    Sig: Take 1 tablet (100 mcg total) by mouth daily before breakfast. Not to take with other medications or vitamins within 2 hours.    Dispense:  90 tablet    Refill:  3    Medications Discontinued During This Encounter  Medication Reason  . levothyroxine (SYNTHROID) 125 MCG tablet

## 2020-11-24 ENCOUNTER — Other Ambulatory Visit: Payer: Self-pay

## 2020-11-24 ENCOUNTER — Telehealth: Payer: Self-pay | Admitting: Adult Health

## 2020-11-24 ENCOUNTER — Encounter: Payer: Self-pay | Admitting: Internal Medicine

## 2020-11-24 ENCOUNTER — Ambulatory Visit (INDEPENDENT_AMBULATORY_CARE_PROVIDER_SITE_OTHER): Payer: Medicare Other | Admitting: Internal Medicine

## 2020-11-24 VITALS — BP 100/60 | HR 81 | Ht 61.0 in | Wt 187.0 lb

## 2020-11-24 DIAGNOSIS — E785 Hyperlipidemia, unspecified: Secondary | ICD-10-CM

## 2020-11-24 DIAGNOSIS — I25810 Atherosclerosis of coronary artery bypass graft(s) without angina pectoris: Secondary | ICD-10-CM

## 2020-11-24 DIAGNOSIS — I4819 Other persistent atrial fibrillation: Secondary | ICD-10-CM

## 2020-11-24 DIAGNOSIS — I10 Essential (primary) hypertension: Secondary | ICD-10-CM

## 2020-11-24 DIAGNOSIS — I739 Peripheral vascular disease, unspecified: Secondary | ICD-10-CM

## 2020-11-24 DIAGNOSIS — I25709 Atherosclerosis of coronary artery bypass graft(s), unspecified, with unspecified angina pectoris: Secondary | ICD-10-CM

## 2020-11-24 DIAGNOSIS — I4891 Unspecified atrial fibrillation: Secondary | ICD-10-CM

## 2020-11-24 MED ORDER — APIXABAN 5 MG PO TABS: 5.0000 mg | ORAL_TABLET | Freq: Two times a day (BID) | ORAL | Status: DC

## 2020-11-24 MED ORDER — AMLODIPINE BESY-BENAZEPRIL HCL 5-10 MG PO CAPS: 1.0000 | ORAL_CAPSULE | Freq: Every day | ORAL | 1 refills | Status: DC

## 2020-11-24 NOTE — Progress Notes (Signed)
New Outpatient Visit Date: 11/24/2020  Referring Provider: Berniece Pap, FNP [address] [place],  [place] [address]  Chief Complaint: Establish cardiology care  HPI:  Ms. Tello is a 80 y.o. female who is being seen today for the evaluation of atrial fibrillation and coronary artery disease at the request of [name]. She has a history of coronary disease status post CABG and PCI, peripheral vascular disease, atrial fibrillation, stroke, hypertension, hyperlipidemia, hypothyroidism, anemia, depression, and anxiety.  She recently moved to [place] to be closer to family.  She was hospitalized in January due to encephalopathy, falls, and failure to thrive.  She was subsequently seen by Ms. [name] in the office last week and atrial fibrillation.  Patient was advised to go to the emergency department for further evaluation in the setting of innumerable falls and increasing dizziness in the setting of atrial fibrillation.  The patient declined.  Today, Ms. Clodfelter reports that she is feeling fairly well.  She moved up to [place] to be with her daughter from [place] earlier this year.  Prior to coming here, she was extremely weak and had innumerable falls.  Due to her marked weakness and failure to thrive, her daughter took her to [facility] in January, where evidence of prior infarcts was uncovered.  In hindsight, Ms. Dileonardo has had balance problems and weakness for at least a year.  Today, Ms. Lobdell notes some exertional dyspnea.  She had some chest pain earlier this year that she attributed to her multiple falls.  This seems to have gotten better.  She intermittently feels lightheadedness but no palpitations.  She also complains of cramping in the left calf, especially at night.  She was previously prescribed cilostazol with this.  She has trouble staying well-hydrated, which is prompted her daughter to give her Pedialyte on a regular basis.  Without this, Ms.  Lodge frequently feels nauseated.  Ms. Roesler has not followed with a cardiologist in many years but was seeing her PCP at the [facility] clinic in [place], [place], fairly regularly.  Her daughter provides cards related to her prior cardiac intervention, indicating CABG in 2005 (LIMA to LAD, SVG to diagonal and OM, and SVG to PDA).  A year later, there appears to have been failure of multiple grafts leading to placement of Taxus stents to the proximal LAD, mid LCx, and RCA x2.  Ms. Schetter was started on apixaban last year in the setting of atrial fibrillation/flutter.  5 mg twice daily had been prescribed, though her other daughter living in [place], who is reportedly a pharmacist, decrease the dose to 2.5 mg daily as she felt this was more appropriate for her mother.  --------------------------------------------------------------------------------------------------  Cardiovascular History & Procedures: Cardiovascular Problems:  Coronary artery disease status post CABG and PCI  Paroxysmal atrial fibrillation  Peripheral vascular disease  Risk Factors:  Known CAD and PAD, stroke, obesity, tobacco use, and age greater than 90  Cath/PCI:  LHC/PCI (2006): Taxus DES to proximal LAD, mid LCx, and RCA x2.  CV Surgery:  CABG (2005, LIMA to LAD, SVG to diagonal and OM, and SVG to PDA)  EP Procedures and Devices:  None  Non-Invasive Evaluation(s):  None available  Recent CV Pertinent Labs: Lab Results  Component Value Date   INR 1.2 10/06/2020   K 4.9 11/22/2020   BUN 11 11/22/2020   CREATININE 0.88 11/22/2020    --------------------------------------------------------------------------------------------------  Past Medical History:  Diagnosis Date  . Allergy   . Anemia   .  Anxiety   . Arthritis   . Back pain   . Coronary artery disease   . Depression   . Hypertension   . Peripheral vascular disease (HCC)   . Stroke (HCC)   . Thyroid disease     Past Surgical History:   Procedure Laterality Date  . back injections    . CARDIAC CATHETERIZATION    . CORONARY ANGIOPLASTY    . CORONARY ARTERY BYPASS GRAFT      Current Meds  Medication Sig  . amLODipine-benazepril (LOTREL) 10-20 MG capsule Take 1 capsule by mouth daily.  Marland Kitchen apixaban (ELIQUIS) 5 MG TABS tablet Take 2.5 mg by mouth 2 (two) times daily.  Marland Kitchen aspirin EC 81 MG tablet Take 81 mg by mouth daily. Swallow whole.  Marland Kitchen atorvastatin (LIPITOR) 20 MG tablet Take 20 mg by mouth at bedtime.  . cilostazol (PLETAL) 100 MG tablet Take 100 mg by mouth 2 (two) times daily.  . clonazePAM (KLONOPIN) 0.5 MG tablet Take 1 tablet (0.5 mg total) by mouth 2 (two) times daily.  Marland Kitchen docusate sodium (COLACE) 100 MG capsule Take 300 mg by mouth daily as needed for mild constipation.  Marland Kitchen gemfibrozil (LOPID) 600 MG tablet   . HYDROcodone-acetaminophen (NORCO) 10-325 MG tablet Take 1 tablet by mouth 3 (three) times daily as needed for moderate pain.  Marland Kitchen levothyroxine (SYNTHROID) 100 MCG tablet Take 1 tablet (100 mcg total) by mouth daily before breakfast. Not to take with other medications or vitamins within 2 hours.  . metoprolol succinate (TOPROL-XL) 100 MG 24 hr tablet Take 100 mg by mouth at bedtime. Take with or immediately following a meal.  . vitamin B-12 (CYANOCOBALAMIN) 500 MCG tablet Take 500 mcg by mouth daily at 6 (six) AM.  . [DISCONTINUED] lithium carbonate 300 MG capsule Take 300 mg by mouth at bedtime.    Allergies: Patient has no known allergies.  Social History   Tobacco Use  . Smoking status: Former Smoker    Types: Cigarettes    Quit date: 05/2020    Years since quitting: 0.5  . Smokeless tobacco: Never Used  . Tobacco comment: Smoked about 1 pack per month  Substance Use Topics  . Alcohol use: Not Currently  . Drug use: Never    Family History  Problem Relation Age of Onset  . Breast cancer Mother   . Bipolar disorder Daughter   . Heart attack Father   . Diabetes Mellitus II Neg Hx     Review  of Systems: A 12-system review of systems was performed and was negative except as noted in the HPI.  --------------------------------------------------------------------------------------------------  Physical Exam: BP 100/60 (BP Location: Right Arm, Patient Position: Sitting, Cuff Size: Large)   Pulse 81   Ht 5\' 1"  (1.549 m)   Wt 187 lb (84.8 kg)   SpO2 97%   BMI 35.33 kg/m   General: Elderly woman, seated in a wheelchair and accompanied by her daughter. HEENT: No conjunctival pallor or scleral icterus. Facemask in place. Neck: Supple without lymphadenopathy, thyromegaly, JVD, or HJR. No carotid bruit. Lungs: Normal work of breathing.  Mildly diminished breath sounds throughout without wheezes or crackles. Heart: Irregularly irregular with 1/6 systolic murmur.  No rubs or gallops.  Unable to assess PMI due to body habitus. Abd: Bowel sounds present. Soft, NT/ND.  Unable to assess HSM due to body habitus. Ext: No lower extremity edema. Radial, PT, and DP pulses are 2+ bilaterally Skin: Warm and dry without rash. Neuro: CNIII-XII intact grossly intact.  4+/5 upper and lower extremity strength bilaterally. Psych: Normal mood and affect.  EKG: Atrial fibrillation with nonspecific ST/T changes.  No significant change from prior tracing on 11/18/2020.  Lab Results  Component Value Date   WBC 6.2 11/22/2020   HGB 11.8 11/22/2020   HCT 36.6 11/22/2020   MCV 92 11/22/2020   PLT 267 11/22/2020    Lab Results  Component Value Date   NA 137 11/22/2020   K 4.9 11/22/2020   CL 101 11/22/2020   CO2 19 (L) 11/22/2020   BUN 11 11/22/2020   CREATININE 0.88 11/22/2020   GLUCOSE 89 11/22/2020   ALT 10 11/22/2020    No results found for: CHOL, HDL, LDLCALC, LDLDIRECT, TRIG, CHOLHDL   --------------------------------------------------------------------------------------------------  ASSESSMENT AND PLAN: Persistent atrial fibrillation: Ms. Beaudry remains in atrial fibrillation which  has been present for at least a few months, potentially longer based on the limited records.  Ventricular rate is adequately controlled today.  She has been taking apixaban 2.5 mg twice daily, with reduced dose recommended by her pharmacist daughter.  However, based on the patient's weight, age, and creatinine, this is subtherapeutic.  We have agreed to increase apixaban back to 5 mg twice daily in the setting of a CHA2DS2-VASc score of of at least 6.  History of frequent falls is concerning, though it sounds like since moving to [place], this has not been an issue.  We will obtain an echocardiogram for further evaluation of LVEF and to exclude other structural abnormalities.  We will continue metoprolol succinate 100 mg daily.  Coronary artery disease: No angina reported, though chronic exertional dyspnea certainly could be an anginal equivalent.  It sounds like the patient had a four-vessel CABG in 2005 with subsequent failure of multiple grafts leading to three-vessel PCI in 2006 with Taxus stents.  This places her at elevated risk for late stent thrombosis based on Taxus stent implantation.  She is currently on apixaban, aspirin, and cilostazol.  We will plan to continue apixaban and aspirin 81 mg daily as tolerated.  We will obtain an echocardiogram.  Peripheral vascular disease: Ms. Buchholz reports nocturnal cramping in the left calf but no obvious claudication.  Her pedal pulses seem normal on examination today.  Nonetheless, she is on cilostazol, which would be indicated for treatment of claudication from significant PAD.  We have agreed to obtain ABIs to assess for significant PAD.  If these are normal, I would favor discontinuation of cilostazol, as her symptoms are not consistent with claudication and continued use of cilostazol could increase risk for bleeding.  Hyperlipidemia: Outside lipid panel from 10/22/2020 is notable for total cholesterol 133, triglycerides 159, HDL 46, and LDL 60.  I  think it is reasonable to continue atorvastatin 20 mg daily for secondary prevention of CAD +/- PAD.  Hypertension: Blood pressure today is low normal.  In the setting of some fatigue, I think it would be advisable to de-escalate Ms. Miner's antihypertensive regimen.  We have agreed to halve amlodipine/benazepril to 5-10 mg daily.  Follow-up: Return to clinic in 1 month.  Yvonne Kendall, MD 11/24/2020 12:28 PM

## 2020-11-24 NOTE — Telephone Encounter (Signed)
Medication Refill - Medication: HYDROcodone-acetaminophen (NORCO) 10-325 MG tablet  Pt has one more day  Has the patient contacted their pharmacy? No. (Agent: If no, request that the patient contact the pharmacy for the refill.) (Agent: If yes, when and what did the pharmacy advise?)  Preferred Pharmacy (with phone number or street name):  [facility] [phone] [facility],  - [address]  [address] [place] [address]  Phone: [phone] Fax: [phone]     Agent: Please be advised that RX refills may take up to 3 business days. We ask that you follow-up with your pharmacy.

## 2020-11-24 NOTE — Patient Instructions (Signed)
Medication Instructions:  Your physician has recommended you make the following change in your medication:  1- INCREASE Eliquis to 5 mg by mouth two times a day. 2- DECREASE Amlodipine/Benazepril to 5-10 mg (1 tablet) by mouth once a day.  *If you need a refill on your cardiac medications before your next appointment, please call your pharmacy*  Lab Work: none If you have labs (blood work) drawn today and your tests are completely normal, you will receive your results only by: Marland Kitchen MyChart Message (if you have MyChart) OR . A paper copy in the mail If you have any lab test that is abnormal or we need to change your treatment, we will call you to review the results.  Testing/Procedures: 1- ECHOCARDIOGRAM- Your physician has requested that you have an echocardiogram. Echocardiography is a painless test that uses sound waves to create images of your heart. It provides your doctor with information about the size and shape of your heart and how well your heart's chambers and valves are working. This procedure takes approximately one hour. There are no restrictions for this procedure. There is a possibility that an IV may need to be started during your test to inject an image enhancing agent. This is done to obtain more optimal pictures of your heart. Therefore we ask that you do at least drink some water prior to coming in to hydrate your veins.    2- ABI's and Lower extremity dopplers- Your physician has requested that you have an ankle brachial index (ABI). During this test an ultrasound and blood pressure cuff are used to evaluate the arteries that supply the arms and legs with blood. Allow thirty minutes for this exam. There are no restrictions or special instructions.  Your physician has requested that you have a lower extremity arterial doppler- During this test, ultrasound is used to evaluate arterial blood flow in the legs. Allow approximately one hour for this exam.     Follow-Up: At  [facility], you and your health needs are our priority.  As part of our continuing mission to provide you with exceptional heart care, we have created designated Provider Care Teams.  These Care Teams include your primary Cardiologist (physician) and Advanced Practice Providers (APPs -  Physician Assistants and Nurse Practitioners) who all work together to provide you with the care you need, when you need it.  We recommend signing up for the patient portal called "MyChart".  Sign up information is provided on this After Visit Summary.  MyChart is used to connect with patients for Virtual Visits (Telemedicine).  Patients are able to view lab/test results, encounter notes, upcoming appointments, etc.  Non-urgent messages can be sent to your provider as well.   To learn more about what you can do with MyChart, go to [email or web address].    Your next appointment:   After testing completed.  The format for your next appointment:   In Person  Provider:   You may see DR Cristal Deer END or one of the following Advanced Practice Providers on your designated Care Team:    Nicolasa Ducking, NP  Eula Listen, PA-C  Marisue Ivan, PA-C  Cadence Atoka, [place]  Gillian Shields, NP    Echocardiogram An echocardiogram is a test that uses sound waves (ultrasound) to produce images of the heart. Images from an echocardiogram can provide important information about: Heart size and shape. The size and thickness and movement of your heart's walls. Heart muscle function and strength. Heart valve function or if you have  stenosis. Stenosis is when the heart valves are too narrow. If blood is flowing backward through the heart valves (regurgitation). A tumor or infectious growth around the heart valves. Areas of heart muscle that are not working well because of poor blood flow or injury from a heart attack. Aneurysm detection. An aneurysm is a weak or damaged part of an artery wall. The wall bulges  out from the normal force of blood pumping through the body. Tell a health care provider about: Any allergies you have. All medicines you are taking, including vitamins, herbs, eye drops, creams, and over-the-counter medicines. Any blood disorders you have. Any surgeries you have had. Any medical conditions you have. Whether you are pregnant or may be pregnant. What are the risks? Generally, this is a safe test. However, problems may occur, including an allergic reaction to dye (contrast) that may be used during the test. What happens before the test? No specific preparation is needed. You may eat and drink normally. What happens during the test? You will take off your clothes from the waist up and put on a hospital gown. Electrodes or electrocardiogram (ECG)patches may be placed on your chest. The electrodes or patches are then connected to a device that monitors your heart rate and rhythm. You will lie down on a table for an ultrasound exam. A gel will be applied to your chest to help sound waves pass through your skin. A handheld device, called a transducer, will be pressed against your chest and moved over your heart. The transducer produces sound waves that travel to your heart and bounce back (or "echo" back) to the transducer. These sound waves will be captured in real-time and changed into images of your heart that can be viewed on a video monitor. The images will be recorded on a computer and reviewed by your health care provider. You may be asked to change positions or hold your breath for a short time. This makes it easier to get different views or better views of your heart. In some cases, you may receive contrast through an IV in one of your veins. This can improve the quality of the pictures from your heart. The procedure may vary among health care providers and hospitals.   What can I expect after the test? You may return to your normal, everyday life, including diet, activities,  and medicines, unless your health care provider tells you not to do that. Follow these instructions at home: It is up to you to get the results of your test. Ask your health care provider, or the department that is doing the test, when your results will be ready. Keep all follow-up visits. This is important. Summary An echocardiogram is a test that uses sound waves (ultrasound) to produce images of the heart. Images from an echocardiogram can provide important information about the size and shape of your heart, heart muscle function, heart valve function, and other possible heart problems. You do not need to do anything to prepare before this test. You may eat and drink normally. After the echocardiogram is completed, you may return to your normal, everyday life, unless your health care provider tells you not to do that. This information is not intended to replace advice given to you by your health care provider. Make sure you discuss any questions you have with your health care provider. Document Revised: 05/11/2020 Document Reviewed: 05/11/2020 Elsevier Patient Education  2021 [employer].

## 2020-11-24 NOTE — Telephone Encounter (Signed)
Requested medication (s) are due for refill today- unsure  Requested medication (s) are on the active medication list -yes  Future visit scheduled -no  Last refill: 10/12/20  Notes to clinic: Request refill non delegated Rx- outside provider  Requested Prescriptions  Pending Prescriptions Disp Refills   HYDROcodone-acetaminophen (NORCO) 10-325 MG tablet 5 tablet 0    Sig: Take 1 tablet by mouth 3 (three) times daily as needed for moderate pain.      Not Delegated - Analgesics:  Opioid Agonist Combinations Failed - 11/24/2020 11:13 AM      Failed - This refill cannot be delegated      Passed - Urine Drug Screen completed in last 360 days      Passed - Valid encounter within last 6 months    Recent Outpatient Visits           6 days ago Paroxysmal atrial fibrillation [facility])   [facility] [name], [name], FNP                    Requested Prescriptions  Pending Prescriptions Disp Refills   HYDROcodone-acetaminophen (NORCO) 10-325 MG tablet 5 tablet 0    Sig: Take 1 tablet by mouth 3 (three) times daily as needed for moderate pain.      Not Delegated - Analgesics:  Opioid Agonist Combinations Failed - 11/24/2020 11:13 AM      Failed - This refill cannot be delegated      Passed - Urine Drug Screen completed in last 360 days      Passed - Valid encounter within last 6 months    Recent Outpatient Visits           6 days ago Paroxysmal atrial fibrillation [facility])   [facility] [name], [name], FNP

## 2020-11-26 ENCOUNTER — Encounter: Payer: Self-pay | Admitting: Internal Medicine

## 2020-11-26 ENCOUNTER — Other Ambulatory Visit: Payer: Self-pay | Admitting: Adult Health

## 2020-11-26 DIAGNOSIS — E785 Hyperlipidemia, unspecified: Secondary | ICD-10-CM | POA: Insufficient documentation

## 2020-11-26 DIAGNOSIS — I4819 Other persistent atrial fibrillation: Secondary | ICD-10-CM | POA: Insufficient documentation

## 2020-11-26 DIAGNOSIS — I10 Essential (primary) hypertension: Secondary | ICD-10-CM | POA: Insufficient documentation

## 2020-11-26 MED ORDER — HYDROCODONE-ACETAMINOPHEN 5-325 MG PO TABS: 1.0000 | ORAL_TABLET | Freq: Three times a day (TID) | ORAL | 0 refills | Status: DC | PRN

## 2020-11-26 NOTE — Progress Notes (Signed)
Meds ordered this encounter  Medications  . HYDROcodone-acetaminophen (NORCO/VICODIN) 5-325 MG tablet    Sig: Take 1 tablet by mouth every 8 (eight) hours as needed for severe pain.    Dispense:  30 tablet    Refill:  0    Medications Discontinued During This Encounter  Medication Reason  . HYDROcodone-acetaminophen (NORCO) 10-325 MG tablet Completed Course

## 2020-11-26 NOTE — Telephone Encounter (Signed)
Meds ordered this encounter  Medications   HYDROcodone-acetaminophen (NORCO/VICODIN) 5-325 MG tablet    Sig: Take 1 tablet by mouth every 8 (eight) hours as needed for severe pain.    Dispense:  30 tablet    Refill:  0   She had not seen cardiology at the time and has a history of falls. Refusal was due to waiting on cardiology evaluation. She has since seen cardiology and no further falls have been reported. I am ok prescribing the above lower dose Norco/ Vicodin to take as directed. Do not recommend 10/325 given her history of falls. Pain management to take over narcotic management once she is in as she has been referred. Daughter should monitor for safety and fall prevention as can cause drowsiness and decreased level of consciousness increasing risk for more falls.

## 2020-11-26 NOTE — Telephone Encounter (Signed)
Pts daughter called stating that the pt is completely out of this medication and is requesting to speak with PCP regarding the refusal. Please advise.

## 2020-12-03 ENCOUNTER — Encounter: Payer: Self-pay | Admitting: Adult Health

## 2020-12-03 NOTE — Progress Notes (Signed)
Established Patient Office Visit  Subjective:  Patient ID: Carmen [name], female    DOB: 03/08/1941  Age: 80 y.o. MRN: [id number]  CC:  Chief Complaint  Patient presents with  . Follow-up    HPI Daughter is present.  Carmen [name] presents for  follow up after seeing cardiology on 11/24/20 patient was seen by Dr. Okey Dupre for evaluation of atrial fibrillation and coronary disease. At cardiology visit doctor agreed that patient has been in atrial fibrillation for the past few months, he increased Apixaban from 2.5mg  BID to  BID and decreased Amlodipine/Benazepril to 5-10mg  qd. She is accompanied by her daughter.   Denies any urinary symptoms.    At cardiology visit echocardiogram and ABI was ordered, she was advised to follow back with cardiology once testing was completed. Patients daughter who is accompanied with her today states that echo is schedule for 01/11/21.   Patient states since last office visit her pain in her upper back she reports is severe, at last visit we decreased patients Hydrocodone-Acetaminophen to 5-325mg  q8 PRN. Patient states since decreasing dosage she experienced more pain and reports difficulty still sleeping through the night.  Patients daughter states at last visit in office they had discussed peeling of patients skin and states that peeling is now worse especially on right hand and foot.   She has had no falls since the last visit.  Patient  denies any fever, body aches,chills, rash, chest pain, shortness of breath, nausea, vomiting, or diarrhea.   Denies dizziness, lightheadedness, pre syncopal or syncopal episodes.   Past Medical History:  Diagnosis Date  . Allergy   . Anemia   . Anxiety   . Arthritis   . Back pain   . Coronary artery disease   . Depression   . Hypertension   . Peripheral vascular disease (HCC)   . Stroke (HCC)   . Thyroid disease     Past Surgical History:  Procedure Laterality Date  . back injections    . CARDIAC  CATHETERIZATION    . CORONARY ANGIOPLASTY    . CORONARY ARTERY BYPASS GRAFT      Family History  Problem Relation Age of Onset  . Breast cancer Mother   . Bipolar disorder Daughter   . Heart attack Father   . Diabetes Mellitus II Neg Hx     Social History   Socioeconomic History  . Marital status: Widowed    Spouse name: Not on file  . Number of children: Not on file  . Years of education: Not on file  . Highest education level: Not on file  Occupational History  . Not on file  Tobacco Use  . Smoking status: Former Smoker    Types: Cigarettes    Quit date: 05/2020    Years since quitting: 0.6  . Smokeless tobacco: Never Used  . Tobacco comment: Smoked about 1 pack per month  Substance and Sexual Activity  . Alcohol use: Not Currently  . Drug use: Never  . Sexual activity: Not on file  Other Topics Concern  . Not on file  Social History Narrative   ** Merged History Encounter **       Social Determinants of Health   Financial Resource Strain: Not on file  Food Insecurity: No Food Insecurity  . Worried About Programme researcher, broadcasting/film/video in the Last Year: Never true  . Ran Out of Food in the Last Year: Never true  Transportation Needs: No Transportation Needs  . Lack of  Transportation (Medical): No  . Lack of Transportation (Non-Medical): No  Physical Activity: Not on file  Stress: Not on file  Social Connections: Not on file  Intimate Partner Violence: Not on file    Outpatient Medications Prior to Visit  Medication Sig Dispense Refill  . amLODipine-benazepril (LOTREL) 5-10 MG capsule Take 1 capsule by mouth daily. 90 capsule 1  . apixaban (ELIQUIS) 5 MG TABS tablet Take 1 tablet (5 mg total) by mouth 2 (two) times daily. 60 tablet   . aspirin EC 81 MG tablet Take 81 mg by mouth daily. Swallow whole.    Marland Kitchen atorvastatin (LIPITOR) 20 MG tablet Take 20 mg by mouth at bedtime.    . cilostazol (PLETAL) 100 MG tablet Take 100 mg by mouth 2 (two) times daily.    .  clonazePAM (KLONOPIN) 0.5 MG tablet Take 1 tablet (0.5 mg total) by mouth 2 (two) times daily. 5 tablet 0  . docusate sodium (COLACE) 100 MG capsule Take 300 mg by mouth daily as needed for mild constipation.    Marland Kitchen gemfibrozil (LOPID) 600 MG tablet     . levothyroxine (SYNTHROID) 100 MCG tablet Take 1 tablet (100 mcg total) by mouth daily before breakfast. Not to take with other medications or vitamins within 2 hours. 90 tablet 3  . metoprolol succinate (TOPROL-XL) 100 MG 24 hr tablet Take 100 mg by mouth at bedtime. Take with or immediately following a meal.    . vitamin B-12 (CYANOCOBALAMIN) 500 MCG tablet Take 500 mcg by mouth daily at 6 (six) AM.    . HYDROcodone-acetaminophen (NORCO/VICODIN) 5-325 MG tablet Take 1 tablet by mouth every 8 (eight) hours as needed for severe pain. 30 tablet 0   No facility-administered medications prior to visit.    No Known Allergies  ROS Review of Systems  Constitutional: Positive for fatigue. Negative for activity change, appetite change, chills, diaphoresis, fever and unexpected weight change.  HENT: Negative.   Respiratory: Negative.   Cardiovascular: Negative.   Gastrointestinal: Negative.   Genitourinary: Negative.   Musculoskeletal: Positive for arthralgias and back pain. Negative for gait problem, joint swelling, myalgias, neck pain and neck stiffness.  Skin: Negative.   Neurological: Negative.   Hematological: Negative.       Objective:    Physical Exam Vitals reviewed.  Constitutional:      General: She is not in acute distress.    Appearance: She is well-developed. She is not diaphoretic.     Interventions: She is not intubated. HENT:     Head: Normocephalic and atraumatic.     Right Ear: External ear normal.     Left Ear: External ear normal.     Nose: Nose normal.     Mouth/Throat:     Pharynx: No oropharyngeal exudate.  Eyes:     General: Lids are normal. No scleral icterus.       Right eye: No discharge.        Left eye:  No discharge.     Conjunctiva/sclera: Conjunctivae normal.     Right eye: Right conjunctiva is not injected. No exudate or hemorrhage.    Left eye: Left conjunctiva is not injected. No exudate or hemorrhage.    Pupils: Pupils are equal, round, and reactive to light.  Neck:     Thyroid: No thyroid mass or thyromegaly.     Vascular: Normal carotid pulses. No carotid bruit, hepatojugular reflux or JVD.     Trachea: Trachea and phonation normal. No tracheal tenderness or tracheal  deviation.     Meningeal: Brudzinski's sign and Kernig's sign absent.  Cardiovascular:     Rate and Rhythm: Normal rate. Rhythm irregular.     Pulses: Normal pulses.          Radial pulses are 2+ on the right side and 2+ on the left side.       Dorsalis pedis pulses are 2+ on the right side and 2+ on the left side.       Posterior tibial pulses are 2+ on the right side and 2+ on the left side.     Heart sounds: Normal heart sounds, S1 normal and S2 normal. Heart sounds not distant. No murmur heard. No friction rub. No gallop.   Pulmonary:     Effort: Pulmonary effort is normal. No tachypnea, bradypnea, accessory muscle usage or respiratory distress. She is not intubated.     Breath sounds: Normal breath sounds. No stridor. No wheezing or rales.  Chest:     Chest wall: No tenderness.  Breasts:     Right: No supraclavicular adenopathy.     Left: No supraclavicular adenopathy.    Abdominal:     General: Bowel sounds are normal. There is no distension or abdominal bruit.     Palpations: Abdomen is soft. There is no shifting dullness, fluid wave, hepatomegaly, splenomegaly, mass or pulsatile mass.     Tenderness: There is no abdominal tenderness. There is no guarding or rebound.     Hernia: No hernia is present.  Musculoskeletal:        General: No tenderness or deformity.     Cervical back: Full passive range of motion without pain, normal range of motion and neck supple. No edema, erythema or rigidity. No spinous  process tenderness or muscular tenderness. Normal range of motion.  Lymphadenopathy:     Head:     Right side of head: No submental, submandibular, tonsillar, preauricular, posterior auricular or occipital adenopathy.     Left side of head: No submental, submandibular, tonsillar, preauricular, posterior auricular or occipital adenopathy.     Cervical: No cervical adenopathy.     Right cervical: No superficial, deep or posterior cervical adenopathy.    Left cervical: No superficial, deep or posterior cervical adenopathy.     Upper Body:     Right upper body: No supraclavicular or pectoral adenopathy.     Left upper body: No supraclavicular or pectoral adenopathy.  Skin:    General: Skin is warm and dry.     Coloration: Skin is not pale.     Findings: Rash (skin on bilateral fingers peeling no erythema, dry skin. ) present. No abrasion, bruising, burn, ecchymosis, erythema, lesion or petechiae.     Nails: There is no clubbing.  Neurological:     Mental Status: She is alert and oriented to person, place, and time.     GCS: GCS eye subscore is 4. GCS verbal subscore is 5. GCS motor subscore is 6.     Cranial Nerves: No cranial nerve deficit.     Sensory: No sensory deficit.     Motor: No tremor, atrophy, abnormal muscle tone or seizure activity.     Coordination: Coordination normal.     Gait: Gait normal.     Deep Tendon Reflexes: Reflexes are normal and symmetric.     Reflex Scores:      Tricep reflexes are 2+ on the right side and 2+ on the left side.      Bicep reflexes are 2+ on  the right side and 2+ on the left side.      Brachioradialis reflexes are 2+ on the right side and 2+ on the left side.      Patellar reflexes are 2+ on the right side and 2+ on the left side.      Achilles reflexes are 2+ on the right side and 2+ on the left side. Psychiatric:        Speech: Speech normal.        Behavior: Behavior normal.        Thought Content: Thought content normal.        Judgment:  Judgment normal.     There were no vitals taken for this visit. Wt Readings from Last 3 Encounters:  11/24/20 187 lb (84.8 kg)  11/18/20 184 lb 9.6 oz (83.7 kg)  10/08/20 181 lb (82.1 kg)     Health Maintenance Due  Topic Date Due  . Hepatitis C Screening  Never done  . COVID-19 Vaccine (1) Never done  . TETANUS/TDAP  Never done  . DEXA SCAN  Never done  . PNA vac Low Risk Adult (1 of 2 - PCV13) Never done  . INFLUENZA VACCINE  Never done    There are no preventive care reminders to display for this patient.  Lab Results  Component Value Date   TSH 1.270 11/22/2020   Lab Results  Component Value Date   WBC 6.2 11/22/2020   HGB 11.8 11/22/2020   HCT 36.6 11/22/2020   MCV 92 11/22/2020   PLT 267 11/22/2020   Lab Results  Component Value Date   NA 137 11/22/2020   K 4.9 11/22/2020   CO2 19 (L) 11/22/2020   GLUCOSE 89 11/22/2020   BUN 11 11/22/2020   CREATININE 0.88 11/22/2020   BILITOT 0.3 11/22/2020   ALKPHOS 65 11/22/2020   AST 15 11/22/2020   ALT 10 11/22/2020   PROT 6.8 11/22/2020   ALBUMIN 4.3 11/22/2020   CALCIUM 9.5 11/22/2020   ANIONGAP 12 10/08/2020   No results found for: CHOL No results found for: HDL No results found for: LDLCALC No results found for: TRIG No results found for: CHOLHDL Lab Results  Component Value Date   HGBA1C 5.8 (H) 10/08/2020      Assessment & Plan:   Problem List Items Addressed This Visit      Cardiovascular and Mediastinum   Atrial flutter (HCC)   Relevant Orders   Ambulatory referral to Home Health   CAD (coronary artery disease)   Relevant Orders   Ambulatory referral to Home Health     Endocrine   Hypothyroidism   Relevant Orders   Ambulatory referral to Home Health     Other   Depression - Primary   Relevant Orders   Ambulatory referral to Home Health   Multiple falls   Relevant Orders   Comprehensive Metabolic Panel (CMET)   Ambulatory referral to Home Health    Other Visit Diagnoses     Anxiety       Relevant Orders   Ambulatory referral to Home Health   Paroxysmal atrial fibrillation (HCC)       Relevant Orders   Ambulatory referral to Home Health   Fungal infection       Relevant Medications   ketoconazole (NIZORAL) 2 % cream   Other Relevant Orders   Ambulatory referral to Home Health   History of CVA (cerebrovascular accident)       Relevant Orders   Ambulatory referral  to Home Health   PVD (peripheral vascular disease) [facility])       Relevant Orders   Ambulatory referral to Home Health      Meds ordered this encounter  Medications  . ketoconazole (NIZORAL) 2 % cream    Sig: Apply 1 application topically 2 (two) times daily.    Dispense:  60 g    Refill:  0   Depression, unspecified depression type - Plan: Ambulatory referral to Home Health  Anxiety - Plan: Ambulatory referral to Home Health  Paroxysmal atrial fibrillation (HCC) - Plan: Ambulatory referral to Home Health  Multiple falls - Plan: Comprehensive Metabolic Panel (CMET), Ambulatory referral to Home Health  Fungal infection - Plan: ketoconazole (NIZORAL) 2 % cream, Ambulatory referral to Home Health  Hypothyroidism, unspecified type - Plan: Ambulatory referral to Home Health  Atrial flutter, unspecified type (HCC) - Plan: Ambulatory referral to Home Health  History of CVA (cerebrovascular accident) - Plan: Ambulatory referral to Home Health  Coronary artery disease involving other coronary artery bypass graft without angina pectoris - Plan: Ambulatory referral to Home Health  PVD (peripheral vascular disease) (HCC) - Plan: Ambulatory referral to Home Health  Keep cardiology appointment.  Meds ordered this encounter  Medications  . ketoconazole (NIZORAL) 2 % cream    Sig: Apply 1 application topically 2 (two) times daily.    Dispense:  60 g    Refill:  0   Follow-up: Return in about 3 months (around 03/08/2021), or if symptoms worsen or fail to improve, for at any time for any worsening  symptoms, Go to Emergency room/ urgent care if worse.    Jairo Ben, FNP

## 2020-12-06 ENCOUNTER — Other Ambulatory Visit: Payer: Self-pay

## 2020-12-06 ENCOUNTER — Ambulatory Visit (INDEPENDENT_AMBULATORY_CARE_PROVIDER_SITE_OTHER): Payer: Medicare Other | Admitting: Adult Health

## 2020-12-06 VITALS — BP 136/86 | HR 92 | Temp 98.0°F | Wt 188.0 lb

## 2020-12-06 DIAGNOSIS — F32A Depression, unspecified: Secondary | ICD-10-CM

## 2020-12-06 DIAGNOSIS — F419 Anxiety disorder, unspecified: Secondary | ICD-10-CM | POA: Diagnosis not present

## 2020-12-06 DIAGNOSIS — I25810 Atherosclerosis of coronary artery bypass graft(s) without angina pectoris: Secondary | ICD-10-CM

## 2020-12-06 DIAGNOSIS — I739 Peripheral vascular disease, unspecified: Secondary | ICD-10-CM

## 2020-12-06 DIAGNOSIS — B49 Unspecified mycosis: Secondary | ICD-10-CM

## 2020-12-06 DIAGNOSIS — Z8673 Personal history of transient ischemic attack (TIA), and cerebral infarction without residual deficits: Secondary | ICD-10-CM

## 2020-12-06 DIAGNOSIS — E039 Hypothyroidism, unspecified: Secondary | ICD-10-CM

## 2020-12-06 DIAGNOSIS — R296 Repeated falls: Secondary | ICD-10-CM | POA: Diagnosis not present

## 2020-12-06 DIAGNOSIS — I4892 Unspecified atrial flutter: Secondary | ICD-10-CM

## 2020-12-06 DIAGNOSIS — I480 Paroxysmal atrial fibrillation: Secondary | ICD-10-CM | POA: Diagnosis not present

## 2020-12-06 MED ORDER — KETOCONAZOLE 2 % EX CREA
1.0000 | TOPICAL_CREAM | Freq: Two times a day (BID) | CUTANEOUS | 0 refills | Status: DC
Start: 2020-12-06 — End: 2021-03-14

## 2020-12-06 NOTE — Patient Instructions (Addendum)
Nature conservation officer at [employer] 3.8 20 [employer] Medical clinic in [place], [place] COVID-19 info: [email or web address] Address: [address], [place], [place] [address] Hours:  Open ? Closes 5PM Phone: [phone]   Labs in 2 months walk in and follow up after that.   Ketoconazole tablets What is this medicine? KETOCONAZOLE (kee toe KON na zole) is an antifungal medicine. It is used to treat certain kinds of fungal infections. This medicine may be used for other purposes; ask your health care provider or pharmacist if you have questions. COMMON BRAND NAME(S): Nizoral What should I tell my health care provider before I take this medicine? They need to know if you have any of these conditions:  adrenal problems  an alcohol abuse problem  history of irregular heartbeat  low stomach acid production  liver disease  an unusual or allergic reaction to ketoconazole, itraconazole, miconazole, other medicines, foods, dyes or preservatives  pregnant or trying to get pregnant  breast-feeding How should I use this medicine? Take this medicine by mouth with a full glass of water. Follow the directions on the prescription label. This medicine works best if you take it with food. Take your medicine at regular intervals. Do not take your medicine more often than directed. Do not stop taking except on your doctor's advice. A special MedGuide will be given to you by the pharmacist with each prescription and refill. Be sure to read this information carefully each time. Talk to your pediatrician regarding the use of this medicine in children. Special care may be needed. Overdosage: If you think you have taken too much of this medicine contact a poison control center or emergency room at once. NOTE: This medicine is only for you. Do not share this medicine with others. What if I miss a dose? If you miss a dose, take it as soon as you can. If it is almost time for your next  dose, take only that dose. Do not take double or extra doses. What may interact with this medicine? Do not take this medicine with any of the following medications:  alfuzosin  certain medicines for anxiety or sleep like alprazolam, midazolam, triazolam  certain medicines for blood pressure like felodipine, nisoldipine, eplerenone  certain medicines for cancer like irinotecan, ibrutinib  certain medicines for cholesterol like cerivastatin, lovastatin, simvastatin, lomitapide  certain medicines for irregular heart rate like disopyramide, dofetilide, dronedarone, quinidine  cisapride  colchicine  conivaptan  ergot alkaloids like dihydroergotamine, ergonovine, ergotamine, methylergonovine  lurasidone  methadone  naloxegol  nevirapine  other medicines that prolong the QT interval (cause an abnormal heart rhythm)  pimozide  ranolazine  red yeast rice  sirolimus  thioridazine  tolvaptan This medicine may also interact with the following medications:  alcohol or any product that contains alcohol  aliskiren  amlodipine  antacids  antiviral medicines for HIV or AIDS  aprepitant  atorvastatin  bosentan  buprenorphine  certain medicine for bladder problems like fesoterodine, solifenacin, tolterodine  certain medicines for cancer like bortezomib, busulfan, dasatinib, docetaxel, erlotinib, imatinib, ixabepilone, lapatinib, nilotinib, paclitaxel, trimetrexate, vinca alkaloids  certain medicines for depression, anxiety, or psychotic disturbances like aripiprazole, buspirone, haloperidol, quetiapine, risperidone  certain medicines for erectile dysfunction like vardenafil, sildenafil, tadalafil  certain medicines for pain like alfentanil, fentanyl, oxycodone, sufentanil  certain medicines for stomach problems like cimetidine, famotidine, omeprazole, lansoprazole  certain medicines that treat or prevent blood clots like dabigatran, rivaroxaban,  warfarin  certain medicines for seizures like carbamazepine, phenytoin  certain medicines  for tuberculosis like isoniazid, INH, rifabutin, rifampin, rifapentine  cilostazol  cinacalcet  cyclosporine  digoxin  eletriptan  isradipine  nadolol  nifedipine  other medicines for fungal infections  praziquantel  ramelteon  repaglinide  salmeterol  saxagliptin  steroid medicines like budesonide, ciclesonide, dexamethasone, methylprednisolone  tacrolimus  tamsulosin  telithromycin  verapamil  ziprasidone This list may not describe all possible interactions. Give your health care provider a list of all the medicines, herbs, non-prescription drugs, or dietary supplements you use. Also tell them if you smoke, drink alcohol, or use illegal drugs. Some items may interact with your medicine. What should I watch for while using this medicine? Visit your doctor or health care professional for check ups. Tell your doctor if your symptoms do not improve. Some fungal infections can take many weeks or months of treatment to cure. Avoid medicines for your stomach like antacids and acid blockers for at least two hours after taking this medicine. You may get drowsy or dizzy. Do not drive, use machinery, or do anything that needs mental alertness until you know how this medicine affects you. Do not stand or sit up quickly, especially if you are an older patient. This reduces the risk of dizzy or fainting spells. Avoid alcohol while you are taking this medicine. Alcohol can increase the risk of liver damage. If you are going to have surgery, let your doctor know that you have been taking this medicine. This medicine may cause a decrease in vitamin D. You should make sure that you get enough vitamin D while you are taking this medicine. Discuss the foods you eat and the vitamins you take with your health care professional. What side effects may I notice from receiving this medicine? Side  effects that you should report to your doctor or health care professional as soon as possible:  allergic reactions like skin rash, itching or hives, swelling of the face, lips, or tongue  dark urine  feeling dizzy, faint  fever  irregular heartbeat, chest pain  light color stools  loss of appetite  usually tired or weak  yellowing of the eyes or skin Side effects that usually do not require medical attention (report to your doctor or health care professional if they continue or are bothersome):  breast swelling and tenderness  change in sex drive or performance  eyes more sensitive to light  headache  nausea, vomiting  stomach pain This list may not describe all possible side effects. Call your doctor for medical advice about side effects. You may report side effects to FDA at 1-800-FDA-1088. Where should I keep my medicine? Keep out of the reach of children. Store at room temperature between 15 and 25 degrees C (59 and 77 degrees F). Keep container tightly closed. Throw away any unused medicine after the expiration date. NOTE: This sheet is a summary. It may not cover all possible information. If you have questions about this medicine, talk to your doctor, pharmacist, or health care provider.  2021 Elsevier/Gold Standard (2018-09-09 13:48:56)

## 2020-12-08 ENCOUNTER — Telehealth: Payer: Self-pay | Admitting: Adult Health

## 2020-12-08 NOTE — Telephone Encounter (Signed)
  Patient was advised to follow up with PCP if the 5-325 MG tablet was not working. Patient would like to go back to  HYDROcodone-acetaminophen (NORCO/VICODIN) 10-325MG .    Has the patient contacted their pharmacy? Yes.    (Agent: If yes, when and what did the pharmacy advise?) to contact PCP   Preferred Pharmacy (with phone number or street name):   [facility] [phone] [address], [place] Phone:  [phone]  Fax:  [phone]      Agent: Please be advised that RX refills may take up to 3 business days. We ask that you follow-up with your pharmacy.

## 2020-12-08 NOTE — Telephone Encounter (Signed)
Please review and advise if appropriate to increase dose. KW

## 2020-12-09 NOTE — Patient Instructions (Addendum)
Thank you for allowing the Chronic Care Management team to participate in your care.    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 Chronic Disease Management support and educational needs r/t CAD and Persistent A-Fib  Case Manager Clinical Goal(s):  Carmen [name] Kitchen Over the next 120 days, patient will not require hospitalization or emergent care d/t complications r/t cardiac disease.  Interventions:  . Collaboration with [name], Kelby Aline, FNP regarding development and update of comprehensive plan of care as evidenced by provider attestation and co-signature . Inter-disciplinary care team collaboration (see longitudinal plan of care) . Reviewed medications with patient's daughter/caregiver. Confirmed that patient has all needed prescriptions. Encouraged to ensure she takes medications as prescribed and notify the provider if she is unable to tolerate the regimen. Encouraged to notify the care management team with concerns regarding medication management or prescription cost. . Discussed nutritional intake and importance of adhering to a cardiac prudent/heart healthy diet. . Discussed current treatment plan. Discussed established parameters for BP and pulse along with indications for notifying a provider. Encouraged to monitor and record readings. Provided information regarding worsening s/sx that require immediate medical attention. Daughter reports monitoring her frequently. No complaints of chest pain, palpitations or shortness of breath since being in the daughter's home. Reports her mobility is limited by an unsteady gait but no complaints of fatigue or decreased activity tolerance. Advised to assess daily and notify provider with concerns. She is  pending outreach with the Cardiology team.   Patient Goals/Self-Care Activities: Over the next 120 days, patient's caregiver will: -Ensure patient takes all medications as prescribed -Ensure patient adheres to a cardiac prudent/heart healthy diet -Monitor patient's BP and pulse and assess for symptoms r/t cardiac complications -Ensure patient completes outreach with the Cardiology team as scheduled -Notify provider or care management team with questions and new concerns as needed    Follow Up Plan:  Will follow up next month   Patient Care Plan: Functional Decline/Impaired Mobility    Problem Identified: Functional Decline     Long-Range Goal: Maintain Mobility and Function   Start Date: 11/19/2020  Expected End Date: 03/19/2021  Priority: High  Note:   Fall Risk  11/19/2020 11/18/2020 11/18/2020  Falls in the past year? 1 1 0  Number falls in past yr: 1 1 0  Comment - 20-30 -  Injury with Fall? 1 1 0  Risk for fall due to : History of fall(s);Mental status change;Impaired balance/gait;Impaired mobility;Medication side effect History of fall(s);Mental status change;Medication side effect;Impaired balance/gait -  Follow up Falls prevention discussed - -     Current Barriers:  . High Risk for Falls r/t Impaired balance and Chronic back pain . Unable to perform ADLs independently . Unable to perform IADLs independently  Clinical Goal(s):  Carmen [name] Kitchen Over the next 120 days, patient will not experience falls or require emergent care d/t fall related injuries.  Interventions:  . Collaboration with [name], Kelby Aline, FNP regarding development and update of comprehensive plan of care as evidenced by provider attestation and co-signature . Inter-disciplinary care team collaboration (see longitudinal plan of care) . Reviewed medications with caregiver and discussed potential side effects of such as lightheadedness, dizziness and frequent urination. Encouraged to promptly notify provider if  patient seems  unable to tolerate the prescribed regimen. . Provided information regarding safety and fall prevention measures. Reports patient uses a walker when ambulating. Also has a bedside commode to use as needed. Encouraged to ensure assistive devices are readily available to use when needed. Daughter confirmed that patient is never left alone and family members are currently assisting with providing care. . Discussed plan for Home Health services. Discussed with the primary care provider today. Team agrees that patient will benefit from nursing, therapy and home aide services. Will follow up to confirm start of services once referral is submitted.   Self-Care Deficits/Patient Goals:  Over the next 120 days, caregiver will: -Ensure patient utilizes assistive device appropriately with all ambulation -Ensure pathways are clear and well lit -Ensure patient wears secure fitting non skid footwear when ambulating -Contact provider or care management team with questions and new concerns as needed   Follow Up Plan:  Will follow up next month          Ms. Roher's daughter/caregiver Carmen [name] was given information about Chronic Care Management services including:  1. CCM service includes personalized support from designated clinical staff supervised by her physician, including individualized plan of care and coordination with other care providers 2. 24/7 contact phone numbers for assistance for urgent and routine care needs. 3. Service will only be billed when office clinical staff spend 20 minutes or more in a month to coordinate care. 4. Only one practitioner may furnish and bill the service in a calendar month. 5. The patient may stop CCM services at any time (effective at the end of the month) by phone call to the office staff. 6. The patient will be responsible for cost sharing (co-pay) of up to 20% of the service fee (after annual deductible is met).  Patient's daughter/caregiver  Carmen [name] agreed to services. Verbal consent obtained.       [name]'s daughter Carmen [name] verbalized understanding of the information discussed during the telephonic outreach today. Declined need for mailed/printed instructions. A member of the care management team will follow up within the next month.    [facility] [phone]

## 2020-12-09 NOTE — Telephone Encounter (Signed)
Left message notifying patient. KW

## 2020-12-09 NOTE — Telephone Encounter (Signed)
Yes ok I will do hydrocodone 5/325 mg po take 1-2 tablets every 8 hours as needed for moderate severe pain .# 120 tablets No refills before 03/11/21  She should be hearing from pain clinic shortly and They will then take over pain control.

## 2020-12-10 ENCOUNTER — Telehealth: Payer: Self-pay | Admitting: *Deleted

## 2020-12-10 ENCOUNTER — Other Ambulatory Visit: Payer: Self-pay | Admitting: Adult Health

## 2020-12-10 ENCOUNTER — Telehealth: Payer: Self-pay | Admitting: Adult Health

## 2020-12-10 MED ORDER — HYDROCODONE-ACETAMINOPHEN 5-325 MG PO TABS: 1.0000 | ORAL_TABLET | Freq: Three times a day (TID) | ORAL | 0 refills | Status: DC | PRN

## 2020-12-10 NOTE — Progress Notes (Signed)
12/10/20  Meds ordered this encounter  Medications  . HYDROcodone-acetaminophen (NORCO/VICODIN) 5-325 MG tablet    Sig: Take 1-2 tablets by mouth every 8 (eight) hours as needed for severe pain (take only as needed.).    Dispense:  90 tablet    Refill:  0  will prescribe only until she gets in with pain management. She has a referral in place.

## 2020-12-10 NOTE — Telephone Encounter (Signed)
Printed office note and left on your desk for you to sign. KW

## 2020-12-10 NOTE — Telephone Encounter (Signed)
Requested medication (s) are due for refill today: Yes  Requested medication (s) are on the active medication list: Yes  Last refill:  12/10/20  Future visit scheduled: No  [facility]  [facility] this resent to [facility], see notes.     Requested Prescriptions  Pending Prescriptions Disp Refills   HYDROcodone-acetaminophen (NORCO/VICODIN) 5-325 MG tablet 90 tablet 0    Sig: Take 1-2 tablets by mouth every 8 (eight) hours as needed for severe pain (take only as needed.).      Not Delegated - Analgesics:  Opioid Agonist Combinations Failed - 12/10/2020  3:25 PM      Failed - This refill cannot be delegated      Passed - Urine Drug Screen completed in last 360 days      Passed - Valid encounter within last 6 months    Recent Outpatient Visits           4 days ago Depression, unspecified depression type   [facility] [name], [name], FNP   3 weeks ago Paroxysmal atrial fibrillation [facility])   [facility] [name], [name], FNP       Future Appointments             In 1 week Dunn, Raymon Mutton, PA-C [facility], [facility]

## 2020-12-10 NOTE — Telephone Encounter (Signed)
Medication: HYDROcodone-acetaminophen (NORCO/VICODIN) 5-325 MG tablet [[id number]] Pt daughter called stating [facility] - can this be sent to the below pharmacy please?  Has the patient contacted their pharmacy? YES (Agent: If no, request that the patient contact the pharmacy for the refill.) (Agent: If yes, when and what did the pharmacy advise?)  Preferred Pharmacy (with phone number or street name): [facility] [address] [place]  Agent: Please be advised that RX refills may take up to 3 business days. We ask that you follow-up with your pharmacy.

## 2020-12-10 NOTE — Telephone Encounter (Signed)
Meds ordered this encounter  Medications   HYDROcodone-acetaminophen (NORCO/VICODIN) 5-325 MG tablet    Sig: Take 1-2 tablets by mouth every 8 (eight) hours as needed for severe pain (take only as needed.).    Dispense:  90 tablet    Refill:  0  Sent refill, will only fill until she gets in with pain management and that referral has been placed.

## 2020-12-10 NOTE — Telephone Encounter (Signed)
Faxing signed office note to [facility]. KW

## 2020-12-10 NOTE — Telephone Encounter (Signed)
Copied from CRM [id number]. Topic: General - Other >> Dec 10, 2020  3:00 PM Gaetana Michaelis A wrote: Reason for CRM: [name] has made contact regarding patient's office visit from 12/06/20  Mickeal Needy is requesting a copy of the office visit note signed by Prov. [name] when possible  [facility] has received a previous copy of the note but it is unsigned   A signed copy of the notes can be sent to [facility] via fax at [phone]  Please contact to advise further if necessary

## 2020-12-10 NOTE — Telephone Encounter (Signed)
Noted and signed returned.

## 2020-12-13 ENCOUNTER — Other Ambulatory Visit: Payer: Self-pay | Admitting: Adult Health

## 2020-12-13 ENCOUNTER — Telehealth: Payer: Self-pay | Admitting: Adult Health

## 2020-12-13 MED ORDER — HYDROCODONE-ACETAMINOPHEN 5-325 MG PO TABS: 1.0000 | ORAL_TABLET | Freq: Three times a day (TID) | ORAL | 0 refills | Status: DC | PRN

## 2020-12-13 NOTE — Progress Notes (Signed)
Meds ordered this encounter  Medications  . HYDROcodone-acetaminophen (NORCO/VICODIN) 5-325 MG tablet    Sig: Take 1-2 tablets by mouth every 8 (eight) hours as needed for severe pain (take only as needed.).    Dispense:  90 tablet    Refill:  0  sent above to [facility] [address] [facility] per daughter request. PMP was cheked, no pick up on previous sent  [facility] was advised to call and cancel previous  [facility] [place] [facility] below.    [id number], [place] - [address] [place] [address]  Phone:  [phone] Fax:  [phone]  DEA #:  [id number]

## 2020-12-13 NOTE — Telephone Encounter (Signed)
Home Health Verbal Orders - Caller/Agency: [facility] Number: [phone] Requesting OT/PT/Skilled Nursing/Social Work/Speech Therapy: Just did start of care, Requesting PT  Frequency: 2w6 1w3

## 2020-12-13 NOTE — Telephone Encounter (Signed)
Please advise 

## 2020-12-13 NOTE — Telephone Encounter (Signed)
Spoke with pharmacist and cancelled Rx. Renette Butters

## 2020-12-13 NOTE — Telephone Encounter (Signed)
Pt's daughter called asking if the Hydrocodone can be sent to another pharmacy.  [facility]  They want to know if it could be sent to [facility]  CB#  [phone] is the dayghter #  Carmen [name]

## 2020-12-13 NOTE — Telephone Encounter (Signed)
Provider will send to pharmacy PMP aware has been sent please call previous pharmacy hydrocodone was sent to and cancel that prescription. Provider sending new script [facility] now, thanks !

## 2020-12-14 ENCOUNTER — Ambulatory Visit (INDEPENDENT_AMBULATORY_CARE_PROVIDER_SITE_OTHER): Payer: Medicare Other

## 2020-12-14 ENCOUNTER — Other Ambulatory Visit: Payer: Self-pay

## 2020-12-14 DIAGNOSIS — I739 Peripheral vascular disease, unspecified: Secondary | ICD-10-CM

## 2020-12-14 DIAGNOSIS — I4819 Other persistent atrial fibrillation: Secondary | ICD-10-CM

## 2020-12-14 DIAGNOSIS — I25810 Atherosclerosis of coronary artery bypass graft(s) without angina pectoris: Secondary | ICD-10-CM

## 2020-12-14 LAB — ECHOCARDIOGRAM COMPLETE
Area-P 1/2: 4.83 cm2
S' Lateral: 1.9 cm

## 2020-12-14 NOTE — Telephone Encounter (Signed)
Nurse has been advised. KW 

## 2020-12-14 NOTE — Telephone Encounter (Signed)
Ok to give verbal orders for therapy as below in message.

## 2020-12-17 ENCOUNTER — Telehealth: Payer: Self-pay

## 2020-12-17 DIAGNOSIS — E785 Hyperlipidemia, unspecified: Secondary | ICD-10-CM

## 2020-12-17 NOTE — Progress Notes (Signed)
Cardiology Office Note    Date:  12/22/2020   ID:  [name], Carmen November 06, 1940, MRN [id number]  PCP:  Berniece Pap, FNP  Cardiologist:  Yvonne Kendall, MD  Electrophysiologist:  None   Chief Complaint: Follow up  History of Present Illness:   Carmen [name] is a 80 y.o. female with history of CAD s/p CABG and PCI, persistent Afib, CVA, PVD, HTN, HLD, hypothyroidism, anemia, depression, and anxiety who presents for follow up of recent echo and lower extremity ABI.   She recently moved to [place] from [place] to be closer to family. She was evaluated as a new patient by Dr. Okey Dupre on 11/24/2020 at the request of her PCP. She previously underwent 4-vessel CABG in 2005 with LIMA to LAD, sequential SVG to diagonal and OM, and SVG to PDA. A year later, there was failure of multiple grafts and she underwent PCI/DES (Taxus stents) to the proximal LAD, mid LCx, and RCA x 2 in 2006. These details were provided by cards related to her cardiac interventions by her daughter. Prior to establishing with Dr. Okey Dupre, it was noted she had not been followed by a cardiologist for many years, but was seeing her PCP in [place], [place] regularly.   It was noted she had been on Eliquis in the setting of Afib/flutter and was prescribed 5 mg bid, though her daughter, a pharmacist in [place], decreased the patient's dosage to 2.5 mg, as she felt this dose was more appropriate for her mother.   Prior to moving to [place], it was noted she had been extremely weak and had innumerable falls for at least a year. In this setting, she was hospitalized in 10/2020, due to encephalopathy, falls, and failure to thrive. Imaging showed evidence of prior infarcts. She was subsequently seen by her PCP in the office in 11/2020, and was noted to be in Afib. She was advised to go to the ED for further evaluation in the setting of innumerable falls and increasing dizziness in the setting of Afib. The patient declined.  At her visit to establish care with  our office on 11/24/2020, she noted some exertional dyspnea and chest pain that she attributed to her prior falls that had improved. She complained of intermittent lightheadedness and cramping of the left calf, particularly at night. She reported having been previously been prescribed cilostazol. It was noted she had difficulty staying well hydrated and was regularly given Pedialyte by her daughter. Without this, she reported frequent nausea. She was noted to be in rate-controlled Afib, which had been present for at least a few months, and possibly longer. It was noted, she was still on subtherapeutic dosed Eliquis and this was increased to 5 mg bid.   She underwent echo on 12/14/2020 that showed an EF of 55-60%, no RWMA, moderate LVH, normal RVSF and ventricular size, normal PASP, mildly dilated left atrium, and mild mitral regurgitation. Lower extremity ABIs were normal bilaterally.   Following this, PCP has discontinued gemfibrozil and cilostazol.  She comes in today accompanied by one of her daughters and is doing well from a cardiac perspective.  No chest pain, dyspnea, palpitations, falls, presyncope, or syncope.  She does continue to note some positional dizziness though this does seem to be improved on the lower dose of Lotrel.  No hematochezia, melena, hemoptysis, hematuria, or hematemesis.  She is tolerating Eliquis 5 mg twice daily without issues.  No lower extremity swelling or orthopnea.   Labs independently reviewed: 11/2020 - HGB 11.8, PLT  267, TSH normal, BUN 11, SCr 0.88, potassium 4.9, albumin 4.3, AST/ALT normal 10/2020 - A1c 5.8  Past Medical History:  Diagnosis Date  . Allergy   . Anemia   . Anxiety   . Arthritis   . Back pain   . Coronary artery disease   . Depression   . Hypertension   . Peripheral vascular disease (HCC)   . Stroke (HCC)   . Thyroid disease     Past Surgical History:  Procedure Laterality Date  . back injections    . CARDIAC CATHETERIZATION    .  CORONARY ANGIOPLASTY    . CORONARY ARTERY BYPASS GRAFT      Current Medications: Current Meds  Medication Sig  . amLODipine-benazepril (LOTREL) 5-10 MG capsule Take 1 capsule by mouth daily.  Marland Kitchen apixaban (ELIQUIS) 5 MG TABS tablet Take 1 tablet (5 mg total) by mouth 2 (two) times daily.  Marland Kitchen aspirin EC 81 MG tablet Take 81 mg by mouth daily. Swallow whole.  . clonazePAM (KLONOPIN) 0.5 MG tablet Take 0.5 mg by mouth 2 (two) times daily as needed for anxiety.  . docusate sodium (COLACE) 100 MG capsule Take 300 mg by mouth daily as needed for mild constipation.  Marland Kitchen HYDROcodone-acetaminophen (NORCO/VICODIN) 5-325 MG tablet Take 1-2 tablets by mouth every 8 (eight) hours as needed for severe pain (take only as needed.).  Marland Kitchen ketoconazole (NIZORAL) 2 % cream Apply 1 application topically 2 (two) times daily.  Marland Kitchen levothyroxine (SYNTHROID) 100 MCG tablet Take 1 tablet (100 mcg total) by mouth daily before breakfast. Not to take with other medications or vitamins within 2 hours.  . metoprolol succinate (TOPROL-XL) 50 MG 24 hr tablet Take 1 tablet (50 mg total) by mouth daily. Take with or immediately following a meal.  . vitamin B-12 (CYANOCOBALAMIN) 500 MCG tablet Take 500 mcg by mouth every Monday, Wednesday, and Friday.  . [DISCONTINUED] metoprolol succinate (TOPROL-XL) 100 MG 24 hr tablet Take 100 mg by mouth at bedtime. Take with or immediately following a meal.    Allergies:   Patient has no known allergies.   Social History   Socioeconomic History  . Marital status: Widowed    Spouse name: Not on file  . Number of children: Not on file  . Years of education: Not on file  . Highest education level: Not on file  Occupational History  . Not on file  Tobacco Use  . Smoking status: Former Smoker    Types: Cigarettes    Quit date: 05/2020    Years since quitting: 0.6  . Smokeless tobacco: Never Used  . Tobacco comment: Smoked about 1 pack per month  Vaping Use  . Vaping Use: Never used   Substance and Sexual Activity  . Alcohol use: Not Currently  . Drug use: Never  . Sexual activity: Not on file  Other Topics Concern  . Not on file  Social History Narrative   ** Merged History Encounter **       Social Determinants of Health   Financial Resource Strain: Not on file  Food Insecurity: No Food Insecurity  . Worried About Programme researcher, broadcasting/film/video in the Last Year: Never true  . Ran Out of Food in the Last Year: Never true  Transportation Needs: No Transportation Needs  . Lack of Transportation (Medical): No  . Lack of Transportation (Non-Medical): No  Physical Activity: Not on file  Stress: Not on file  Social Connections: Not on file     Family  History:  The patient's family history includes Bipolar disorder in her daughter; Breast cancer in her mother; Heart attack in her father. There is no history of Diabetes Mellitus II.  ROS:   Review of Systems  Constitutional: Positive for malaise/fatigue. Negative for chills, diaphoresis, fever and weight loss.  HENT: Negative for congestion.   Eyes: Negative for discharge and redness.  Respiratory: Negative for cough, hemoptysis, sputum production, shortness of breath and wheezing.   Cardiovascular: Negative for chest pain, palpitations, orthopnea, claudication, leg swelling and PND.  Gastrointestinal: Negative for abdominal pain, blood in stool, heartburn, melena, nausea and vomiting.  Genitourinary: Negative for hematuria.  Musculoskeletal: Negative for falls and myalgias.  Skin: Negative for rash.  Neurological: Positive for dizziness. Negative for tingling, tremors, sensory change, speech change, focal weakness, loss of consciousness and weakness.  Endo/Heme/Allergies: Does not bruise/bleed easily.  Psychiatric/Behavioral: Negative for substance abuse. The patient is not nervous/anxious.   All other systems reviewed and are negative.    EKGs/Labs/Other Studies Reviewed:    Studies reviewed were summarized above.  The additional studies were reviewed today:  2D echo 12/14/2020: 1. Left ventricular ejection fraction, by estimation, is 55 to 60%. The  left ventricle has normal function. The left ventricle has no regional  wall motion abnormalities. There is moderate left ventricular hypertrophy.  Left ventricular diastolic  parameters are indeterminate.  2. Right ventricular systolic function is normal. The right ventricular  size is normal. There is normal pulmonary artery systolic pressure. The  estimated right ventricular systolic pressure is 22.9 mmHg.  3. Left atrial size was mildly dilated.  4. The mitral valve is normal in structure. Mild mitral valve  Regurgitation. __________  Bilateral lower extremity ABI 12/14/2020: Summary:  Right: Resting right ankle-brachial index is within normal range. No  evidence of significant right lower extremity arterial disease. The right  toe-brachial index is normal.   Left: Resting left ankle-brachial index is within normal range. No  evidence of significant left lower extremity arterial disease. The left  toe-brachial index is normal.    EKG:  EKG is not ordered today.    Recent Labs: 11/22/2020: ALT 10; BUN 11; Creatinine, Ser 0.88; Hemoglobin 11.8; Platelets 267; Potassium 4.9; Sodium 137; TSH 1.270  Recent Lipid Panel No results found for: CHOL, TRIG, HDL, CHOLHDL, VLDL, LDLCALC, LDLDIRECT  PHYSICAL EXAM:    VS:  BP 96/60 (BP Location: Right Arm, Patient Position: Sitting, Cuff Size: Large)   Pulse (!) 59   Ht 5\' 1"  (1.549 m)   Wt 178 lb (80.7 kg)   SpO2 95%   BMI 33.63 kg/m   BMI: Body mass index is 33.63 kg/m.  Physical Exam Vitals reviewed.  Constitutional:      Appearance: She is well-developed.  HENT:     Head: Normocephalic and atraumatic.  Eyes:     General:        Right eye: No discharge.        Left eye: No discharge.  Neck:     Vascular: No JVD.  Cardiovascular:     Rate and Rhythm: Normal rate. Rhythm irregularly  irregular.     Pulses: No midsystolic click and no opening snap.          Dorsalis pedis pulses are 2+ on the right side and 2+ on the left side.       Posterior tibial pulses are 2+ on the right side and 2+ on the left side.     Heart sounds: Normal heart  sounds, S1 normal and S2 normal. Heart sounds not distant. No murmur heard. No friction rub.  Pulmonary:     Effort: Pulmonary effort is normal. No respiratory distress.     Breath sounds: Normal breath sounds. No decreased breath sounds, wheezing or rales.  Chest:     Chest wall: No tenderness.  Abdominal:     General: There is no distension.     Palpations: Abdomen is soft.     Tenderness: There is no abdominal tenderness.  Musculoskeletal:     Cervical back: Normal range of motion.  Skin:    General: Skin is warm and dry.     Nails: There is no clubbing.  Neurological:     Mental Status: She is alert and oriented to person, place, and time.  Psychiatric:        Speech: Speech normal.        Behavior: Behavior normal.        Thought Content: Thought content normal.        Judgment: Judgment normal.     Wt Readings from Last 3 Encounters:  12/22/20 178 lb (80.7 kg)  12/10/20 188 lb (85.3 kg)  11/24/20 187 lb (84.8 kg)     ASSESSMENT & PLAN:   1. CAD s/p CABG with subsequent PCI without angina: No symptoms concerning for angina.  Recent echo demonstrated preserved LV systolic function with normal wall motion.  She remains on apixaban and aspirin as directed by interventional cardiology.  Resume atorvastatin as outlined below.  No plans for ischemic evaluation at this time.  2. Persistent Afib: It appears that she has been in A. fib for at least the past several months, potentially longer based on limited records available for review.  Nonetheless, she is asymptomatic with this rhythm.  With mildly bradycardic heart rates noted in the office today, and in the setting of soft BP we have decreased her Toprol-XL to 50 mg daily  as outlined below.  Given her CHA2DS2-VASc of at least 6, and in the context of no recent falls since moving to [place] she remains on apixaban 5 mg twice daily as she does not meet reduced dosing criteria.  No symptoms concerning for bleeding.  Recent CBC demonstrated stable hemoglobin.  3. Lower extremity discomfort: Normal lower extremity ABIs and pulses.  No longer on cilostazol given recent noninvasive imaging.  Symptoms are not consistent with claudication and appeared to have developed when living in [place] after someone fell on her legs.  Follow-up with PCP.  4. HTN: Blood pressure is on the softer side today.  Decrease Toprol-XL to 50 mg daily.  She will likely need some degree of permissive hypertension.  She will otherwise continue lower dose amlodipine/benazepril.  Recommend slow positional changes in an effort to minimize orthostasis.  5. HLD: Most recent LDL of 60 from 10/2020.  No longer on gemfibrozil.  Resume atorvastatin 20 mg daily.  Disposition: F/u with Dr. Okey DupreEnd or an APP in 3 months.   Medication Adjustments/Labs and Tests Ordered: Current medicines are reviewed at length with the patient today.  Concerns regarding medicines are outlined above. Medication changes, Labs and Tests ordered today are summarized above and listed in the Patient Instructions accessible in Encounters.   Signed, Eula Listenyan Judeen Geralds, PA-C 12/22/2020 11:37 AM     [facility] [address] [place], [place] [address] [phone]

## 2020-12-17 NOTE — Telephone Encounter (Signed)
Please review medication was discontinued on 10/13/20 by Dr. Toniann Fail, please advise if appropriate to refill. KW

## 2020-12-17 NOTE — Telephone Encounter (Signed)
[facility] faxed refill request for the following medications:  atorvastatin (LIPITOR) 20 MG tablet  Please advise.  Thanks, [employer]

## 2020-12-20 ENCOUNTER — Other Ambulatory Visit: Payer: Self-pay | Admitting: Adult Health

## 2020-12-20 MED ORDER — ATORVASTATIN CALCIUM 20 MG PO TABS: 20.0000 mg | ORAL_TABLET | Freq: Every evening | ORAL | 3 refills | Status: DC

## 2020-12-20 NOTE — Telephone Encounter (Signed)
Noted  

## 2020-12-20 NOTE — Telephone Encounter (Signed)
Patients daughter has been advised of message below and orders have been placed in chart. KW

## 2020-12-20 NOTE — Progress Notes (Signed)
Medications Discontinued During This Encounter  Medication Reason  . gemfibrozil (LOPID) 600 MG tablet Completed Course  . cilostazol (PLETAL) 100 MG tablet Completed Course   Discussed with Dr. Okey Dupre Cardiology and in agreement give side effects will discontinue the above medications :  I think it would be reasonable to stop gemfibrozil and recheck a lipid panel in 3 months. Recent ABIs in our office were normal, suggesting that she does not have any significant PAD. Cilostazol can be discontinued in that setting. Please let us know if any other questions or concerns arise

## 2020-12-20 NOTE — Telephone Encounter (Signed)
Yes please refill the Lipitor.  Let daughter know I spoke with Dr. Okey Dupre cardiology as well and since her ABI studies are normal we will discontinue the Pletal.  We will discontinue Gemfibrozil due to side effects possible with Lipitor and recheck her lipid panel in 3 months if you would add lipid panel that would be great.   She can discontinue the following medications, please add note to pharmacy these have been discontinued.  Medications Discontinued During This Encounter  Medication Reason   gemfibrozil (LOPID) 600 MG tablet Completed Course   cilostazol (PLETAL) 100 MG tablet Completed Course

## 2020-12-21 ENCOUNTER — Telehealth: Payer: PRIVATE HEALTH INSURANCE

## 2020-12-22 ENCOUNTER — Encounter: Payer: Self-pay | Admitting: Physician Assistant

## 2020-12-22 ENCOUNTER — Ambulatory Visit (INDEPENDENT_AMBULATORY_CARE_PROVIDER_SITE_OTHER): Payer: Medicare Other | Admitting: Physician Assistant

## 2020-12-22 ENCOUNTER — Other Ambulatory Visit: Payer: Self-pay

## 2020-12-22 VITALS — BP 96/60 | HR 59 | Ht 61.0 in | Wt 178.0 lb

## 2020-12-22 DIAGNOSIS — I4819 Other persistent atrial fibrillation: Secondary | ICD-10-CM

## 2020-12-22 DIAGNOSIS — I25810 Atherosclerosis of coronary artery bypass graft(s) without angina pectoris: Secondary | ICD-10-CM

## 2020-12-22 DIAGNOSIS — M79605 Pain in left leg: Secondary | ICD-10-CM

## 2020-12-22 DIAGNOSIS — M79604 Pain in right leg: Secondary | ICD-10-CM

## 2020-12-22 DIAGNOSIS — I10 Essential (primary) hypertension: Secondary | ICD-10-CM

## 2020-12-22 DIAGNOSIS — E785 Hyperlipidemia, unspecified: Secondary | ICD-10-CM

## 2020-12-22 MED ORDER — METOPROLOL SUCCINATE ER 50 MG PO TB24: 50.0000 mg | ORAL_TABLET | Freq: Every day | ORAL | 3 refills | Status: DC

## 2020-12-22 MED ORDER — ATORVASTATIN CALCIUM 20 MG PO TABS: 20.0000 mg | ORAL_TABLET | Freq: Every evening | ORAL | 3 refills | Status: DC

## 2020-12-22 NOTE — Patient Instructions (Addendum)
Medication Instructions:  Your physician has recommended you make the following change in your medication:   1. DECREASE Metoprolol Succinate (Toprol XL) to 50 mg once daily  2. RESTART Atorvastatin 20 mg once daily    *If you need a refill on your cardiac medications before your next appointment, please call your pharmacy*   Lab Work: None  If you have labs (blood work) drawn today and your tests are completely normal, you will receive your results only by: Marland Kitchen MyChart Message (if you have MyChart) OR . A paper copy in the mail If you have any lab test that is abnormal or we need to change your treatment, we will call you to review the results.   Testing/Procedures: None   Follow-Up: At [facility], you and your health needs are our priority.  As part of our continuing mission to provide you with exceptional heart care, we have created designated Provider Care Teams.  These Care Teams include your primary Cardiologist (physician) and Advanced Practice Providers (APPs -  Physician Assistants and Nurse Practitioners) who all work together to provide you with the care you need, when you need it.  We recommend signing up for the patient portal called "MyChart".  Sign up information is provided on this After Visit Summary.  MyChart is used to connect with patients for Virtual Visits (Telemedicine).  Patients are able to view lab/test results, encounter notes, upcoming appointments, etc.  Non-urgent messages can be sent to your provider as well.   To learn more about what you can do with MyChart, go to [email or web address].    Your next appointment:   3 month(s)  The format for your next appointment:   In Person  Provider:   Yvonne Kendall, MD or Eula Listen, PA-C

## 2020-12-23 ENCOUNTER — Encounter: Payer: Self-pay | Admitting: Internal Medicine

## 2020-12-23 ENCOUNTER — Telehealth: Payer: Self-pay | Admitting: Physician Assistant

## 2020-12-23 NOTE — Telephone Encounter (Signed)
Encounter entered in error.

## 2020-12-24 ENCOUNTER — Ambulatory Visit (INDEPENDENT_AMBULATORY_CARE_PROVIDER_SITE_OTHER): Payer: Medicare Other

## 2020-12-24 DIAGNOSIS — I4819 Other persistent atrial fibrillation: Secondary | ICD-10-CM | POA: Diagnosis not present

## 2020-12-24 DIAGNOSIS — R296 Repeated falls: Secondary | ICD-10-CM

## 2020-12-24 DIAGNOSIS — I480 Paroxysmal atrial fibrillation: Secondary | ICD-10-CM

## 2020-12-24 DIAGNOSIS — F32A Depression, unspecified: Secondary | ICD-10-CM

## 2020-12-24 NOTE — Chronic Care Management (AMB) (Signed)
Chronic Care Management   Follow Up Note   12/24/2020 Name: Carmen [name] MRN: [id number] DOB: August 02, 1941  Primary Care Provider: Berniece Pap, FNP Reason for referral : Chronic Care Management  Carmen [name] is a 80 y.o. year old female who is a primary care patient of [name], [name], FNP. The CCM team was consulted for assistance with chronic disease management and care coordination. A telephonic outreach was conducted today with her daughter Carmen [name].  Review of Carmen [name]'s status, including review of consultants reports, relevant labs and test results was conducted today. Collaboration with appropriate care team members was performed as part of the comprehensive evaluation and provision of chronic care management services.    SDOH (Social Determinants of Health) assessments performed: No    Outpatient Encounter Medications as of 12/24/2020  Medication Sig  . amLODipine-benazepril (LOTREL) 5-10 MG capsule Take 1 capsule by mouth daily.  Marland Kitchen apixaban (ELIQUIS) 5 MG TABS tablet Take 1 tablet (5 mg total) by mouth 2 (two) times daily.  Marland Kitchen aspirin EC 81 MG tablet Take 81 mg by mouth daily. Swallow whole.  Marland Kitchen atorvastatin (LIPITOR) 20 MG tablet Take 1 tablet (20 mg total) by mouth at bedtime.  . clonazePAM (KLONOPIN) 0.5 MG tablet Take 0.5 mg by mouth 2 (two) times daily as needed for anxiety.  . docusate sodium (COLACE) 100 MG capsule Take 300 mg by mouth daily as needed for mild constipation.  Marland Kitchen HYDROcodone-acetaminophen (NORCO/VICODIN) 5-325 MG tablet Take 1-2 tablets by mouth every 8 (eight) hours as needed for severe pain (take only as needed.).  Marland Kitchen ketoconazole (NIZORAL) 2 % cream Apply 1 application topically 2 (two) times daily.  Marland Kitchen levothyroxine (SYNTHROID) 100 MCG tablet Take 1 tablet (100 mcg total) by mouth daily before breakfast. Not to take with other medications or vitamins within 2 hours.  . metoprolol succinate (TOPROL-XL) 50 MG 24 hr tablet Take 1 tablet (50 mg  total) by mouth daily. Take with or immediately following a meal.  . vitamin B-12 (CYANOCOBALAMIN) 500 MCG tablet Take 500 mcg by mouth every Monday, Wednesday, and Friday.   No facility-administered encounter medications on file as of 12/24/2020.     Objective:  Patient Care Plan: Functional Decline/Impaired Mobility    Problem Identified: Functional Decline     Long-Range Goal: Maintain Mobility and Function   Start Date: 11/19/2020  Expected End Date: 03/19/2021  Priority: High  Note:    Current Barriers:  . High Risk for Falls r/t Impaired balance and Chronic back pain . Unable to perform ADLs independently . Unable to perform IADLs independently  Clinical Goal(s):  Marland Kitchen Over the next 120 days, patient will not experience falls or require emergent care d/t fall related injuries.  Interventions:  . Collaboration with [name], [name], FNP regarding development and update of comprehensive plan of care as evidenced by provider attestation and co-signature . Inter-disciplinary care team collaboration (see longitudinal plan of care) . Discussed falls since our last outreach. Reviewed safety and fall prevention measures. Daughter Carmen [name] reports she has been ambulating well. No falls since our last discussion. She continues to use her walker as needed when ambulating. Reports she has also been on several outings recently and did well. She received Occupational and Physical Therapy. . Discussed plan for long term care.  Carmen [name] indicated that Carmen [name] will be moving to [facility] apartments next month. Reports that her condition has improved and feels that she is able to live independently. Confirmed that the family will  be available to check on her frequently and assist as needed.    Self-Care Deficits/Patient Goals: -Utilize assistive device appropriately with all ambulation -Ensure pathways are clear and well lit -Wear secure fitting non skid footwear when  ambulating -Contact provider or care management team with questions and new concerns as needed   Follow Up Plan:  Will follow up next month      PLAN A member of the care management team will follow up next month.    [facility] Care [facility] [phone]

## 2020-12-29 ENCOUNTER — Telehealth: Payer: Self-pay

## 2020-12-29 NOTE — Telephone Encounter (Signed)
Letter has been faxed and scanned into chart. KW

## 2020-12-29 NOTE — Telephone Encounter (Signed)
Copied from CRM [id number]. Topic: General - Inquiry >> Dec 28, 2020  4:52 PM Daphine Deutscher D wrote: Reason for CRM: Vernona Rieger with [employer] called asking for an order for  Rollator.  She ask that we fax it to her office and she will get the vendor  FAX@ [phone]

## 2020-12-29 NOTE — Telephone Encounter (Signed)
Prescription written and will give to [facility].  Please fax as below.

## 2021-01-03 ENCOUNTER — Ambulatory Visit: Payer: Self-pay

## 2021-01-03 DIAGNOSIS — R296 Repeated falls: Secondary | ICD-10-CM

## 2021-01-03 DIAGNOSIS — I25810 Atherosclerosis of coronary artery bypass graft(s) without angina pectoris: Secondary | ICD-10-CM

## 2021-01-03 NOTE — Chronic Care Management (AMB) (Signed)
  Chronic Care Management   Note  01/03/2021 Name: Carmen [name] MRN: [id number] DOB: 1941/08/27   Call received from Mrs. Name's daughter [name] requesting assistance with obtaining a rollator walker. [name] will be relocating into her private apartment at [facility]. Wants to ensure that she has the needed equipment in her private residence.  They are currently out of the state but anticipate returning within the next week.  [name] is agreeable to outreach with the Care Guide team to assist with locating affordable equipment.    Follow up plan: Will submit Care Guide referral for assistance with equipment. A member of the care management team will follow up with [name] within the next two weeks.   [facility] Care Management [facility] [phone]

## 2021-01-03 NOTE — Patient Instructions (Signed)
Thank you for allowing the Chronic Care Management team to participate in your care.    Goals Addressed: Patient Care Plan: Functional Decline/Impaired Mobility    Problem Identified: Functional Decline     Long-Range Goal: Maintain Mobility and Function   Start Date: 11/19/2020  Expected End Date: 03/19/2021  Priority: High  Note:    Current Barriers:  . High Risk for Falls r/t Impaired balance and Chronic back pain . Unable to perform ADLs independently . Unable to perform IADLs independently  Clinical Goal(s):  Marland Kitchen Over the next 120 days, patient will not experience falls or require emergent care d/t fall related injuries.  Interventions:  . Collaboration with [name], [name], FNP regarding development and update of comprehensive plan of care as evidenced by provider attestation and co-signature . Inter-disciplinary care team collaboration (see longitudinal plan of care) . Discussed falls since our last outreach. Reviewed safety and fall prevention measures. Daughter [name] reports she has been ambulating well. No falls since our last discussion. She continues to use her walker as needed when ambulating. Reports she has also been on several outings recently and did well. She received Occupational and Physical Therapy. . Discussed plan for long term care.  [name] indicated that Mrs. Demartin will be moving to [facility] apartments next month. Reports that her condition has improved and feels that she is able to live independently. Confirmed that the family will be available to check on her frequently and assist as needed.    Self-Care Deficits/Patient Goals: -Utilize assistive device appropriately with all ambulation -Ensure pathways are clear and well lit -Wear secure fitting non skid footwear when ambulating -Contact provider or care management team with questions and new concerns as needed   Follow Up Plan:  Will follow up next month            Mrs. Hott's  daughter [name] verbalized understanding of the information discussed during the telephonic outreach today. Declined need for mailed/printed instructions. A member of the care management team will follow up next month.    [facility] Care Management [facility] [phone]

## 2021-01-05 ENCOUNTER — Telehealth: Payer: Self-pay

## 2021-01-05 NOTE — Telephone Encounter (Signed)
FYI   Copied from CRM [id number]. Topic: General - Other >> Jan 05, 2021 12:34 PM Pawlus, Maxine Glenn A wrote: Reason for CRM: Maruice lewis [phone]  wanted to report that the Pt had a fall yesterday, caller stated she is okay but wanted to let Marcelino Duster know.

## 2021-01-06 NOTE — Telephone Encounter (Signed)
Noted , have her follow up in person if any symptoms persist change or worsen at anytime.

## 2021-01-10 ENCOUNTER — Ambulatory Visit (INDEPENDENT_AMBULATORY_CARE_PROVIDER_SITE_OTHER): Payer: Medicare Other

## 2021-01-10 DIAGNOSIS — I25810 Atherosclerosis of coronary artery bypass graft(s) without angina pectoris: Secondary | ICD-10-CM

## 2021-01-10 DIAGNOSIS — I10 Essential (primary) hypertension: Secondary | ICD-10-CM | POA: Diagnosis not present

## 2021-01-10 DIAGNOSIS — R296 Repeated falls: Secondary | ICD-10-CM

## 2021-01-11 ENCOUNTER — Other Ambulatory Visit: Payer: Self-pay | Admitting: Adult Health

## 2021-01-11 MED ORDER — CLONAZEPAM 0.5 MG PO TABS: 0.5000 mg | ORAL_TABLET | Freq: Two times a day (BID) | ORAL | 0 refills | Status: DC | PRN

## 2021-01-11 MED ORDER — HYDROCODONE-ACETAMINOPHEN 5-325 MG PO TABS: 1.0000 | ORAL_TABLET | Freq: Three times a day (TID) | ORAL | 0 refills | Status: DC | PRN

## 2021-01-11 NOTE — Telephone Encounter (Signed)
Medication Refill - Medication: Clonazepam, Hydrocodone   Has the patient contacted their pharmacy? Yes.  Pts daughter states that the pharmacy told her to reach back out to PCP. Please advise.  (Agent: If no, request that the patient contact the pharmacy for the refill.) (Agent: If yes, when and what did the pharmacy advise?)  Preferred Pharmacy (with phone number or street name):  [facility] [address], [place] - [address]  [address] [place] [address]  Phone: [phone] Fax: [phone]  Hours: Not open 24 hours     Agent: Please be advised that RX refills may take up to 3 business days. We ask that you follow-up with your pharmacy.

## 2021-01-11 NOTE — Telephone Encounter (Signed)
Requested medication (s) are due for refill today: Yes  Requested medication (s) are on the active medication list: Yes  Last refill:  4 weeks/2 weeks ago  Future visit scheduled: No  Notes to clinic:  Unable to refill per protocol, cannot delegate, last refilled by historical provider      Requested Prescriptions  Pending Prescriptions Disp Refills   HYDROcodone-acetaminophen (NORCO/VICODIN) 5-325 MG tablet 90 tablet 0    Sig: Take 1-2 tablets by mouth every 8 (eight) hours as needed for severe pain (take only as needed.).      Not Delegated - Analgesics:  Opioid Agonist Combinations Failed - 01/11/2021  4:03 PM      Failed - This refill cannot be delegated      Passed - Urine Drug Screen completed in last 360 days      Passed - Valid encounter within last 6 months    Recent Outpatient Visits           1 month ago Depression, unspecified depression type   [facility] [name], [name], FNP   1 month ago Paroxysmal atrial fibrillation [facility])   [facility] [name], [name], FNP       Future Appointments             In 2 months Dunn, Raymon Mutton, PA-C [facility], [facility]               clonazePAM (KLONOPIN) 0.5 MG tablet 30 tablet     Sig: Take 1 tablet (0.5 mg total) by mouth 2 (two) times daily as needed for anxiety.      Not Delegated - Psychiatry:  Anxiolytics/Hypnotics Failed - 01/11/2021  4:03 PM      Failed - This refill cannot be delegated      Passed - Urine Drug Screen completed in last 360 days      Passed - Valid encounter within last 6 months    Recent Outpatient Visits           1 month ago Depression, unspecified depression type   [facility] [name], [name], FNP   1 month ago Paroxysmal atrial fibrillation [facility])   [facility] [name], [name], FNP       Future Appointments             In 2 months Dunn, Raymon Mutton, PA-C [facility],  [facility]

## 2021-01-12 ENCOUNTER — Telehealth: Payer: Self-pay | Admitting: Adult Health

## 2021-01-12 NOTE — Telephone Encounter (Signed)
Called to follow up on an Administrative order that was faxed to office of 4/4 regarding patient.  Please advise and call with an update at [phone], Order #[id number]

## 2021-01-13 ENCOUNTER — Ambulatory Visit: Payer: Self-pay

## 2021-01-13 ENCOUNTER — Telehealth: Payer: Self-pay | Admitting: Adult Health

## 2021-01-13 DIAGNOSIS — I10 Essential (primary) hypertension: Secondary | ICD-10-CM

## 2021-01-13 DIAGNOSIS — I25810 Atherosclerosis of coronary artery bypass graft(s) without angina pectoris: Secondary | ICD-10-CM | POA: Diagnosis not present

## 2021-01-13 DIAGNOSIS — R296 Repeated falls: Secondary | ICD-10-CM

## 2021-01-13 NOTE — Telephone Encounter (Signed)
   Telephone encounter was:  Successful.  01/13/2021 Name: Carmen [name] MRN: [id number] DOB: 1941-02-15  Carmen [name] is a 80 y.o. year old female who is a primary care patient of [name], [name], FNP . The community resource team was consulted for assistance with medical devices  Care guide performed the following interventions: Patient provided with information about care guide support team and interviewed to confirm resource needs Discussed resources to assist with a rollator and a shower chair. Pt's daughter has been dealing with Personnel officer thinks that the shower chair will not be covered by insurance so I found a shower chair for pt at [facility] and they have placed a hold on the chair for pt. The medical supply company is waiting on the doctors office for the chart notes that suppport the need for the medical supplies, the mobility assesment and the confimration of order sent to them so that they can approve these items and move forward. I sent an in basket message to the referral sender and an email with the address for the senior resource center.  Follow up call placed to community resources to determine status of patients referral.  Follow Up Plan:  No further follow up planned at this time. The patient has been provided with needed resources.  Rojelio Brenner Care Guide, Embedded Care Coordination [facility], Care Management Phone: [phone] Email: [email or web address]

## 2021-01-13 NOTE — Telephone Encounter (Signed)
Clarification received new order previous was for DME today order for visits. Signed and given to [facility].

## 2021-01-13 NOTE — Chronic Care Management (AMB) (Signed)
  Chronic Care Management   Note   Name: Carmen [name] MRN: [id number] DOB: 05/19/1941   Coordination  Only: Incoming call from Mrs. Slight's daughter [name] regarding medical equipment. Reports [name] is moving into [facility] today. She will visit [facility] today to confirm availability of a rollator walker and a shower chair. If the equipment is available, an order will be faxed from her from primary care provider.    PLAN: Will follow up later this week.  [facility] Care Management [facility] [phone]

## 2021-01-13 NOTE — Chronic Care Management (AMB) (Signed)
  Chronic Care Management   Note  01/13/2021 Name: Carmen [name] MRN: [id number] DOB: 12-01-1940    Care Coordination Only Update received from [facility] regarding requested equipment. Per staff, the requested shower chair/transfer bench will likely not be covered by Carmen [name]'s insurance. Recommended that Carmen [name]'s daughter Carmen [name] return to the store to view other options. Anticipates the rollator walker being available after the Mobility Assessment form is reviewed. They will contact Carmen [name] when the items are ready for pick up.  Contacted Carmen [name] with update. She agreed to contact the care team if additional assistance is required.   PLAN A member of the care management team will follow up with Carmen [name] within the next week.   [facility] Care Management [facility] [phone]

## 2021-01-13 NOTE — Telephone Encounter (Signed)
This was signed and should have been return faxed by now.

## 2021-01-13 NOTE — Telephone Encounter (Signed)
Marcelino Duster, have you seen anything regarding this patient?

## 2021-01-17 ENCOUNTER — Telehealth: Payer: Self-pay

## 2021-01-17 NOTE — Telephone Encounter (Signed)
Order was faxed this morning, original copy was left in box to be scanned. KW

## 2021-01-17 NOTE — Telephone Encounter (Signed)
Copied from CRM [id number]. Topic: General - Other >> Jan 17, 2021 10:09 AM Darron Doom wrote: Reason for CRM: Crystal with [facility] called in to inquire of a Physicians order # [id number] that was faxed over on 01/13/21. Asking if this can be faxed again. Please call Ph# [phone] Fax# [phone]

## 2021-01-19 ENCOUNTER — Telehealth: Payer: Self-pay

## 2021-01-19 NOTE — Telephone Encounter (Signed)
Copied from CRM [id number]. Topic: General - Call Back - No Documentation >> Jan 19, 2021  1:44 PM Randol Kern wrote: Reason for CRM: [employer] is requesting to speak to the office regarding recent DME supply request, please advise  Best contact: [phone] Diannia Ruder is anticipating fax return.

## 2021-01-19 NOTE — Telephone Encounter (Signed)
Please call for follow up.

## 2021-01-20 NOTE — Telephone Encounter (Signed)
Order has been faxed twice, left message for Diannia Ruder to contact me back to confirm is fax was received. KW

## 2021-01-20 NOTE — Telephone Encounter (Signed)
Will resend fax for a 3rd time to the following numbers below. KW

## 2021-01-20 NOTE — Telephone Encounter (Signed)
Not yet received fax. PLEASE send to alternate fax 1st Fax  [phone] 2nd Fax: [phone]   Diannia Ruder states their fax does this sometimes if you do not mind resending!

## 2021-01-26 ENCOUNTER — Ambulatory Visit: Payer: Self-pay

## 2021-01-26 DIAGNOSIS — R296 Repeated falls: Secondary | ICD-10-CM

## 2021-01-26 DIAGNOSIS — I10 Essential (primary) hypertension: Secondary | ICD-10-CM | POA: Diagnosis not present

## 2021-01-26 DIAGNOSIS — I25810 Atherosclerosis of coronary artery bypass graft(s) without angina pectoris: Secondary | ICD-10-CM | POA: Diagnosis not present

## 2021-01-26 NOTE — Chronic Care Management (AMB) (Signed)
  Chronic Care Management   Note  01/26/2021 Name: Carmen [name] MRN: [id number] DOB: [birth year]   Call received from Mrs. Hosier's daughter [name]. Reports speaking with Diannia Ruder from [facility] earlier today and being informed that they have not received the needed equipment orders and documents. Call placed to [facility]. Per Diannia Ruder, they have not been able to receive the needed documents due to problems with the fax machine. She requested that [name] bring copies of the documents to their location. Message relayed to the clinic staff. A staff member will make copies of the documents and notify [name] when they are ready for pick-up.   PLAN [name] encouraged to call if additional assistance is needed or if additional documents are requested.   [facility] Care Management [facility] [phone]

## 2021-02-01 ENCOUNTER — Telehealth: Payer: Self-pay | Admitting: Adult Health

## 2021-02-01 NOTE — Telephone Encounter (Signed)
Patient is scheduled to follow back up with you on 02/03/21. KW

## 2021-02-01 NOTE — Telephone Encounter (Signed)
I do but they are also asking for the mobility assessment forms that Isabella Bowens signed but sent to Dr. Sullivan Lone to co-sign. Have you seen those forms?

## 2021-02-01 NOTE — Telephone Encounter (Signed)
Pts daughter called to let Cathleen know she wanted to come pick up the paper work for [employer] / she also stated that she wants to cancel the agency that was doing mothers PT / she stated they have only showed up once the week before last and she asked him not to come during er lunch time and he has not been back since or called  / she would like to see if orders for PT can be placed using [facility] in house PT with Galvin Proffer Wynelle Link advise

## 2021-02-01 NOTE — Telephone Encounter (Signed)
Joni Reining do you still have original copy of order on your desk? It was on script pad but I do not see scanned into her chart. KW

## 2021-02-01 NOTE — Telephone Encounter (Signed)
Ok I know I filled out forms and they were to be sent to Dr. Sullivan Lone to sign if you do see them that would be great to sign and send on so she gets her equipment ASAP. Gae Dry was working with this too and I remember messaging her they were completed and waiting on Sullivan Lone to sign.

## 2021-02-03 ENCOUNTER — Telehealth: Payer: Self-pay

## 2021-02-03 ENCOUNTER — Other Ambulatory Visit: Payer: Self-pay | Admitting: Adult Health

## 2021-02-03 ENCOUNTER — Ambulatory Visit (INDEPENDENT_AMBULATORY_CARE_PROVIDER_SITE_OTHER): Payer: Medicare Other | Admitting: Adult Health

## 2021-02-03 ENCOUNTER — Other Ambulatory Visit: Payer: Self-pay

## 2021-02-03 ENCOUNTER — Encounter: Payer: Self-pay | Admitting: Adult Health

## 2021-02-03 VITALS — BP 122/64 | HR 73 | Temp 97.3°F | Ht 60.98 in | Wt 187.0 lb

## 2021-02-03 DIAGNOSIS — E039 Hypothyroidism, unspecified: Secondary | ICD-10-CM

## 2021-02-03 DIAGNOSIS — L853 Xerosis cutis: Secondary | ICD-10-CM | POA: Diagnosis not present

## 2021-02-03 DIAGNOSIS — R296 Repeated falls: Secondary | ICD-10-CM

## 2021-02-03 DIAGNOSIS — B353 Tinea pedis: Secondary | ICD-10-CM

## 2021-02-03 DIAGNOSIS — Z8673 Personal history of transient ischemic attack (TIA), and cerebral infarction without residual deficits: Secondary | ICD-10-CM

## 2021-02-03 DIAGNOSIS — Z9181 History of falling: Secondary | ICD-10-CM

## 2021-02-03 DIAGNOSIS — E538 Deficiency of other specified B group vitamins: Secondary | ICD-10-CM

## 2021-02-03 DIAGNOSIS — I639 Cerebral infarction, unspecified: Secondary | ICD-10-CM

## 2021-02-03 MED ORDER — FLUCONAZOLE 150 MG PO TABS
150.0000 mg | ORAL_TABLET | Freq: Once | ORAL | 0 refills | Status: DC
Start: 2021-02-03 — End: 2021-02-03

## 2021-02-03 MED ORDER — NYSTATIN-TRIAMCINOLONE 100000-0.1 UNIT/GM-% EX OINT: 1.0000 "application " | TOPICAL_OINTMENT | Freq: Two times a day (BID) | CUTANEOUS | 0 refills | Status: DC

## 2021-02-03 MED ORDER — TRAZODONE HCL 50 MG PO TABS: 25.0000 mg | ORAL_TABLET | Freq: Every evening | ORAL | 1 refills | Status: DC | PRN

## 2021-02-03 MED ORDER — CLONAZEPAM 0.5 MG PO TABS: 0.5000 mg | ORAL_TABLET | Freq: Every day | ORAL | 0 refills | Status: DC | PRN

## 2021-02-03 NOTE — Patient Instructions (Signed)
Allergies, Adult An allergy means that your body reacts to something that bothers it (allergen). This can happen from something that you eat, breathe in, or touch. Allergies often affect the nose, eyes, skin, and stomach. They can be mild, moderate, or very bad (severe). An allergy cannot spread from person to person. They can happen at any age. Sometimes, people outgrow them. What are the causes?  Outdoor things, such as pollen, car fumes, and mold.  Indoor things, such as dust, smoke, mold, and pets.  Foods.  Medicines.  Things that bother your skin, such as perfume and bug bites. What increases the risk?  Having family members with allergies or asthma. What are the signs or symptoms? Symptoms depend on how bad your allergy is. Mild to moderate symptoms  Runny nose, stuffy nose, or sneezing.  Itchy mouth, ears, or throat.  A feeling of mucus dripping down the back of your throat.  Sore throat.  Eyes that are itchy, red, watery, or puffy.  A skin rash, or red, swollen areas of skin (hives).  Stomach cramps or bloating. Severe symptoms Very bad allergies to food, medicine, or bug bites may cause a very bad allergy reaction (anaphylaxis). This can be life-threatening. Symptoms include:  A red face.  Wheezing or coughing.  Swollen lips, tongue, or mouth.  Tight or swollen throat.  Chest pain or tightness, or a fast heartbeat.  Trouble breathing or shortness of breath.  Pain in your belly (abdomen), vomiting, or watery poop (diarrhea).  Feeling dizzy or fainting. How is this treated? Treatment for this condition depends on your symptoms. Treatment may include:  Cold, wet cloths for itching and swelling.  Eye drops, nose sprays, or skin creams.  Washing out your nose each day.  A humidifier.  Medicines.  A change to the foods you eat.  Being exposed again and again to tiny amounts of allergens. This helps your body get used to them. You might  have: ? Allergy shots. ? Very small amounts of allergen put under your tongue.  An emergency shot (auto-injector pen) if you have a very bad allergy reaction. ? This is a medicine with a needle. You can put it into your skin by yourself. ? Your doctor will teach you how to use it.      Follow these instructions at home: Medicines  Take or apply over-the-counter and prescription medicines only as told by your doctor.  If you are at risk for a very bad allergy reaction, keep an auto-injector pen with you all the time.   Eating and drinking  Follow instructions from your doctor about what to eat and drink.  Drink enough fluid to keep your pee (urine) pale yellow. General instructions  If you have ever had a very bad allergy reaction, wear a medical alert bracelet or necklace.  Stay away from things that you are allergic to.  Keep all follow-up visits as told by your doctor. This is important. Contact a doctor if:  Your symptoms do not get better with treatment. Get help right away if:  You have symptoms of a very bad allergy reaction. These include: ? A swollen mouth, tongue, or throat. ? Pain or tightness in your chest. ? Trouble breathing. ? Being short of breath. ? Dizziness. ? Fainting. ? Very bad pain in your belly. ? Vomiting. ? Watery poop. These symptoms may be an emergency. Do not wait to see if the symptoms will go away. Get medical help right away. Call your local  emergency services (911 in the [place] Do not drive yourself to the hospital. Summary  Take or apply over-the-counter and prescription medicines only as told by your doctor.  Stay away from things you are allergic to.  If you are at risk for a very bad allergy reaction, carry an auto-injector pen all the time.  Wear a medical alert bracelet or necklace.  Very bad allergy reactions can be life-threatening. Get help right away. This information is not intended to replace advice given to you by your  health care provider. Make sure you discuss any questions you have with your health care provider. Document Revised: 07/30/2019 Document Reviewed: 07/30/2019 Elsevier Patient Education  2021 Elsevier Inc. Athlete's Foot  Athlete's foot (tinea pedis) is a fungal infection of the skin on your feet. It often occurs on the skin that is between or underneath your toes. It can also occur on the soles of your feet. Symptoms include itchy or white and flaky areas on the skin. The infection can spread from person to person (is contagious). It can also spread when a person's bare feet come in contact with the fungus on shower floors or on items such as shoes. Follow these instructions at home: Medicines  Apply or take over-the-counter and prescription medicines only as told by your doctor.  Apply your antifungal medicine as told by your doctor. Do not stop using the medicine even if your feet start to get better. Foot care  Do not scratch your feet.  Keep your feet dry: ? Wear cotton or wool socks. Change your socks every day or if they become wet. ? Wear shoes that allow air to move around, such as sandals or canvas tennis shoes.  Wash and dry your feet: ? Every day or as told by your doctor. ? After exercising. ? Including the area between your toes. General instructions  Do not share any of these items that touch your feet: ? Towels. ? Shoes. ? Nail clippers. ? Other personal items.  Protect your feet by wearing sandals in wet areas, such as locker rooms and shared showers.  Keep all follow-up visits as told by your doctor. This is important.  If you have diabetes, keep your blood sugar under control. Contact a doctor if:  You have a fever.  You have swelling, pain, warmth, or redness in your foot.  Your feet are not getting better with treatment.  Your symptoms get worse.  You have new symptoms. Summary  Athlete's foot is a fungal infection of the skin on your  feet.  Symptoms include itchy or white and flaky areas on the skin.  Apply your antifungal medicine as told by your doctor.  Keep your feet clean and dry. This information is not intended to replace advice given to you by your health care provider. Make sure you discuss any questions you have with your health care provider. Document Revised: 05/06/2020 Document Reviewed: 05/06/2020 Elsevier Patient Education  2021 [employer].

## 2021-02-03 NOTE — Chronic Care Management (AMB) (Signed)
  Chronic Care Management   Note  02/03/2021 Name: Carmen [name] MRN: [id number] DOB: 09-15-1941  Carmen [name] is a 80 y.o. year old female who is a primary care patient of [name], [name], FNP. Carmen [name] is currently enrolled in care management services. An additional referral for Pharm D  was placed.   Follow up plan: Unsuccessful telephone outreach attempt made. A HIPAA compliant phone message was left for the patient providing contact information and requesting a return call.  The care management team will reach out to the patient again over the next 5 days.  If patient returns call to provider office, please advise to call Embedded Care Management Care Guide Carmen [name]  at [phone]  Carmen [name], RMA Care Guide, Embedded Care Coordination [facility]  [place], [place] [address] Direct Dial: [phone] [name]: [email or web address]

## 2021-02-03 NOTE — Telephone Encounter (Signed)
Yes can discontinue, tell pharmacy to let patient known, she was persistent she wanted Diflucan and has taken with no issues in the past but we will discontinue and continue topical until she sees dermatology.

## 2021-02-03 NOTE — Addendum Note (Signed)
Addended by: Berniece Pap on: 02/03/2021 01:19 PM   Modules accepted: Orders

## 2021-02-03 NOTE — Progress Notes (Addendum)
Established Patient Office Visit  Subjective:  Patient ID: Carmen [name], female    DOB: May 10, 1941  Age: 80 y.o. MRN: [id number]  CC:  Chief Complaint  Patient presents with  . Follow-up    Pt would like to discuss hand and feet issues and Mucus production at night.    HPI Carmen [name] presents for follow up - she has not received a walker yet form was filled out by this provider and sent to provider supervising to get signed out. She bought a walker/ sitting standing from a friend and it is nice she now just needs shower chair.  She has had less falls with this walker. Denies any recent falls.  She also was originally on Klonopin three times a day and this provider decreased dosage to once daily and this seems to be helping her anxiety, she is scheduled to see psychiatry.   Send shower chair to [facility]. She has hard time sitting to standing, and has had many falls in the past.  She is at [facility] retirement now.  Daughter cares for her.   She has been seeing psychiatry before she moved here and is scheduled. She also saw pain management prior to moving and is scheduled with them as well and they will take over narcotic fort chronic back pain as they see fit once seen.   Has dry itchy skin on heels bilaterally and also on hands, used antifungal cream and helped but is now returning. Will send to dermatology.   Patient  denies any fever, body aches,chills, rash, chest pain, shortness of breath, nausea, vomiting, or diarrhea.  Denies dizziness, lightheadedness, pre syncopal or syncopal episodes.    Past Medical History:  Diagnosis Date  . Allergy   . Anemia   . Anxiety   . Arthritis   . Back pain   . Coronary artery disease   . Depression   . Hypertension   . Peripheral vascular disease (HCC)   . Stroke (HCC)   . Thyroid disease     Past Surgical History:  Procedure Laterality Date  . back injections    . CARDIAC CATHETERIZATION    . CORONARY ANGIOPLASTY     . CORONARY ARTERY BYPASS GRAFT      Family History  Problem Relation Age of Onset  . Breast cancer Mother   . Bipolar disorder Daughter   . Heart attack Father   . Diabetes Mellitus II Neg Hx     Social History   Socioeconomic History  . Marital status: Widowed    Spouse name: Not on file  . Number of children: Not on file  . Years of education: Not on file  . Highest education level: Not on file  Occupational History  . Not on file  Tobacco Use  . Smoking status: Former Smoker    Types: Cigarettes    Quit date: 05/2020    Years since quitting: 0.7  . Smokeless tobacco: Never Used  . Tobacco comment: Smoked about 1 pack per month  Vaping Use  . Vaping Use: Never used  Substance and Sexual Activity  . Alcohol use: Not Currently  . Drug use: Never  . Sexual activity: Not on file  Other Topics Concern  . Not on file  Social History Narrative   ** Merged History Encounter **       Social Determinants of Health   Financial Resource Strain: Not on file  Food Insecurity: No Food Insecurity  . Worried About Running  Out of Food in the Last Year: Never true  . Ran Out of Food in the Last Year: Never true  Transportation Needs: No Transportation Needs  . Lack of Transportation (Medical): No  . Lack of Transportation (Non-Medical): No  Physical Activity: Not on file  Stress: Not on file  Social Connections: Not on file  Intimate Partner Violence: Not on file    Outpatient Medications Prior to Visit  Medication Sig Dispense Refill  . amLODipine-benazepril (LOTREL) 5-10 MG capsule Take 1 capsule by mouth daily. 90 capsule 1  . apixaban (ELIQUIS) 5 MG TABS tablet Take 1 tablet (5 mg total) by mouth 2 (two) times daily. 60 tablet   . aspirin EC 81 MG tablet Take 81 mg by mouth daily. Swallow whole.    Marland Kitchen atorvastatin (LIPITOR) 20 MG tablet Take 1 tablet (20 mg total) by mouth at bedtime. 30 tablet 3  . docusate sodium (COLACE) 100 MG capsule Take 300 mg by mouth daily as  needed for mild constipation.    Marland Kitchen HYDROcodone-acetaminophen (NORCO/VICODIN) 5-325 MG tablet Take 1-2 tablets by mouth every 8 (eight) hours as needed for severe pain (take only as needed.). 90 tablet 0  . ketoconazole (NIZORAL) 2 % cream Apply 1 application topically 2 (two) times daily. 60 g 0  . levothyroxine (SYNTHROID) 100 MCG tablet Take 1 tablet (100 mcg total) by mouth daily before breakfast. Not to take with other medications or vitamins within 2 hours. 90 tablet 3  . metoprolol succinate (TOPROL-XL) 100 MG 24 hr tablet 0.5 tablet DAILY (route: oral)    . vitamin B-12 (CYANOCOBALAMIN) 500 MCG tablet Take 500 mcg by mouth every Monday, Wednesday, and Friday.    . clonazePAM (KLONOPIN) 0.5 MG tablet Take 1 tablet (0.5 mg total) by mouth 2 (two) times daily as needed for anxiety. 30 tablet 0  . metoprolol succinate (TOPROL-XL) 50 MG 24 hr tablet Take 1 tablet (50 mg total) by mouth daily. Take with or immediately following a meal. 90 tablet 3   No facility-administered medications prior to visit.    No Known Allergies  ROS Review of Systems  Constitutional: Negative.   HENT: Negative.   Respiratory: Negative.   Cardiovascular: Negative.   Gastrointestinal: Negative.   Genitourinary: Negative.   Musculoskeletal: Positive for gait problem (usues walker ). Negative for arthralgias, back pain, joint swelling, myalgias and neck pain.  Skin: Positive for rash.  Hematological: Negative.       Objective:    Physical Exam Vitals reviewed.  Constitutional:      General: She is not in acute distress.    Appearance: Normal appearance. She is not ill-appearing, toxic-appearing or diaphoretic.  Cardiovascular:     Rate and Rhythm: Normal rate and regular rhythm.     Pulses: Normal pulses.     Heart sounds: Normal heart sounds. No murmur heard. No friction rub. No gallop.   Pulmonary:     Effort: Pulmonary effort is normal. No respiratory distress.     Breath sounds: Normal breath  sounds. No stridor. No wheezing, rhonchi or rales.  Chest:     Chest wall: No tenderness.  Abdominal:     Palpations: Abdomen is soft.  Musculoskeletal:        General: Normal range of motion.  Skin:    General: Skin is warm.     Coloration: Skin is not jaundiced or pale.     Findings: Erythema and rash present. No abrasion, abscess, bruising or lesion.  Comments: Dry skin on right hand fingers, flaky. Mild erythema,   Right heel with dry cracked skin.   Both chronic. No warmth or drainage noted to skin.   Neurological:     Gait: Gait abnormal (uses walker for stability, and daughter with her. ).  Psychiatric:        Mood and Affect: Mood normal.        Behavior: Behavior normal.        Thought Content: Thought content normal.        Judgment: Judgment normal.     BP 122/64 (BP Location: Left Arm, Patient Position: Sitting)   Pulse 73   Temp (!) 97.3 F (36.3 C)   Ht 5' 0.98" (1.549 m)   Wt 187 lb (84.8 kg)   SpO2 98%   BMI 35.35 kg/m  Wt Readings from Last 3 Encounters:  02/03/21 187 lb (84.8 kg)  12/22/20 178 lb (80.7 kg)  12/10/20 188 lb (85.3 kg)     There are no preventive care reminders to display for this patient.  There are no preventive care reminders to display for this patient.  Lab Results  Component Value Date   TSH 1.270 11/22/2020   Lab Results  Component Value Date   WBC 6.2 11/22/2020   HGB 11.8 11/22/2020   HCT 36.6 11/22/2020   MCV 92 11/22/2020   PLT 267 11/22/2020   Lab Results  Component Value Date   NA 137 11/22/2020   K 4.9 11/22/2020   CO2 19 (L) 11/22/2020   GLUCOSE 89 11/22/2020   BUN 11 11/22/2020   CREATININE 0.88 11/22/2020   BILITOT 0.3 11/22/2020   ALKPHOS 65 11/22/2020   AST 15 11/22/2020   ALT 10 11/22/2020   PROT 6.8 11/22/2020   ALBUMIN 4.3 11/22/2020   CALCIUM 9.5 11/22/2020   ANIONGAP 12 10/08/2020   No results found for: CHOL No results found for: HDL No results found for: LDLCALC No results found  for: TRIG No results found for: CHOLHDL Lab Results  Component Value Date   HGBA1C 5.8 (H) 10/08/2020      Assessment & Plan:   Problem List Items Addressed This Visit      Cardiovascular and Mediastinum   CVA (cerebral vascular accident) (HCC)   Relevant Medications   metoprolol succinate (TOPROL-XL) 100 MG 24 hr tablet   Other Relevant Orders   DME Bedside commode     Endocrine   Hypothyroidism - Primary   Relevant Medications   metoprolol succinate (TOPROL-XL) 100 MG 24 hr tablet   Other Relevant Orders   CBC with Differential/Platelet   Comprehensive metabolic panel   TSH   Lipid panel     Musculoskeletal and Integument   Dry skin dermatitis   Relevant Medications   nystatin-triamcinolone ointment (MYCOLOG)   Other Relevant Orders   CBC with Differential/Platelet   Comprehensive metabolic panel   Ambulatory referral to Dermatology   Athlete's foot on right   Relevant Medications   nystatin-triamcinolone ointment (MYCOLOG)   Other Relevant Orders   Ambulatory referral to Dermatology     Other   B12 deficiency   Relevant Orders   B12   Multiple falls   At risk for falls   Relevant Orders   DME Bedside commode      1. Hypothyroidism, unspecified type  Need to recheck TSH  - CBC with Differential/Platelet; Future - Comprehensive metabolic panel; Future - TSH; Future - Lipid panel; Future  2. B12 deficiency  -  B12; Future   3. Cerebrovascular accident (CVA), unspecified mechanism (HCC) Needs DME.  - DME Bedside commode/ combo shower chair needed,   4. Dry skin dermatitis Moisturize with emollient.  - CBC with Differential/Platelet; Future - Comprehensive metabolic panel; Future - nystatin-triamcinolone ointment (MYCOLOG); Apply 1 application topically 2 (two) times daily.  Dispense: 30 g; Refill: 0 - Ambulatory referral to Dermatology  5. At risk for falls - DME Bedside commode/ combo shower chair   6. Athlete's foot on right Athletes  foot spray per package instructions.  - Ambulatory referral to Dermatology  7. Multiple falls- history of  Doing well with walker and decreased klonopin seeing psychiatry in near future.  Also seeing pain management for chronic back pain and they will take over pain medication if they see fit- patient and daughter are aware. After review will not do Diflucan as possible side effects interaction with other medications.   8. Insomnia:  Discussed trial of Trazodone, discussed side effects and safety precautions.   Meds ordered this encounter  Medications  . traZODone (DESYREL) 50 MG tablet    Sig: Take 0.5 tablets (25 mg total) by mouth at bedtime as needed for sleep.    Dispense:  90 tablet    Refill:  1  . nystatin-triamcinolone ointment (MYCOLOG)    Sig: Apply 1 application topically 2 (two) times daily.    Dispense:  30 g    Refill:  0  . clonazePAM (KLONOPIN) 0.5 MG tablet    Sig: Take 1 tablet (0.5 mg total) by mouth daily as needed for anxiety.    Dispense:  30 tablet    Refill:  0   CCM ordered pharmacy , nurse care management and social worker.  Follow-up: Return in about 3 months (around 05/06/2021), or if symptoms worsen or fail to improve, for at any time for any worsening symptoms, Go to Emergency room/ urgent care if worse.    Jairo BenMichelle Smith Diasha Castleman, FNP

## 2021-02-08 ENCOUNTER — Telehealth: Payer: Self-pay

## 2021-02-08 NOTE — Telephone Encounter (Signed)
Order for shower chair and walker with wheels has been successfully faxed to [employer].

## 2021-02-16 ENCOUNTER — Telehealth: Payer: Self-pay | Admitting: Adult Health

## 2021-02-16 ENCOUNTER — Other Ambulatory Visit: Payer: Self-pay | Admitting: Adult Health

## 2021-02-16 ENCOUNTER — Telehealth: Payer: Self-pay

## 2021-02-16 DIAGNOSIS — G8929 Other chronic pain: Secondary | ICD-10-CM

## 2021-02-16 NOTE — Chronic Care Management (AMB) (Signed)
  Chronic Care Management   Note  02/16/2021 Name: Carmen [name] MRN: [id number] DOB: 29-Nov-1940  Carmen [name] is a 80 y.o. year old female who is a primary care patient of [name], Kelby Aline, FNP. I reached out to [facility] by phone today in response to a referral sent by Carmen [name]'s PCP, [name], Kelby Aline, FNP     Carmen [name] was given information about Chronic Care Management services today including:  1. CCM service includes personalized support from designated clinical staff supervised by her physician, including individualized plan of care and coordination with other care providers 2. 24/7 contact phone numbers for assistance for urgent and routine care needs. 3. Service will only be billed when office clinical staff spend 20 minutes or more in a month to coordinate care. 4. Only one practitioner may furnish and bill the service in a calendar month. 5. The patient may stop CCM services at any time (effective at the end of the month) by phone call to the office staff. 6. The patient will be responsible for cost sharing (co-pay) of up to 20% of the service fee (after annual deductible is met).  Patient agreed to services and verbal consent obtained.   Follow up plan: Telephone appointment with care management team member scheduled for:  Pharm D 02/23/2021 RN CM 02/25/2021  Noreene Larsson, [address], [place], [place] [address] Direct Dial: [phone] [email or web address]

## 2021-02-16 NOTE — Telephone Encounter (Signed)
Patient's daughter is requesting a refill for her mother, she needs a refill on HYDROcodone-acetaminophen (NORCO/VICODIN) 5-325 MG tablet

## 2021-02-16 NOTE — Telephone Encounter (Signed)
RX Refill:norco/vicodin Last Seen:02-03-21 Last ordered:01-11-21

## 2021-02-17 DIAGNOSIS — G479 Sleep disorder, unspecified: Secondary | ICD-10-CM | POA: Insufficient documentation

## 2021-02-17 DIAGNOSIS — M545 Low back pain, unspecified: Secondary | ICD-10-CM | POA: Insufficient documentation

## 2021-02-20 ENCOUNTER — Other Ambulatory Visit: Payer: Self-pay | Admitting: Adult Health

## 2021-02-20 DIAGNOSIS — G8929 Other chronic pain: Secondary | ICD-10-CM

## 2021-02-20 MED ORDER — HYDROCODONE-ACETAMINOPHEN 5-325 MG PO TABS: 1.0000 | ORAL_TABLET | Freq: Three times a day (TID) | ORAL | 0 refills | Status: DC | PRN

## 2021-02-20 NOTE — Progress Notes (Signed)
No orders of the defined types were placed in this encounter.  Meds ordered this encounter  Medications  . HYDROcodone-acetaminophen (NORCO/VICODIN) 5-325 MG tablet    Sig: Take 1-2 tablets by mouth every 8 (eight) hours as needed for severe pain (take only as needed.).    Dispense:  90 tablet    Refill:  0  awaiting pain management has been referred, pain management will take over narcotic if deemed appropriate. She also will need a pain contract here at new office.

## 2021-02-20 NOTE — Telephone Encounter (Signed)
Refilled, she is awaiting pain management has she gotten an appointment yet ?  Also will need pain contract here at next office visit. Pain management to take over if deemed appropriate by pain management MD.

## 2021-02-21 NOTE — Telephone Encounter (Signed)
Left message for patient to return call back.  

## 2021-02-22 NOTE — Telephone Encounter (Signed)
Left message for patient to return call back.  

## 2021-02-23 ENCOUNTER — Ambulatory Visit (INDEPENDENT_AMBULATORY_CARE_PROVIDER_SITE_OTHER): Payer: Medicare Other | Admitting: Pharmacist

## 2021-02-23 DIAGNOSIS — I639 Cerebral infarction, unspecified: Secondary | ICD-10-CM | POA: Diagnosis not present

## 2021-02-23 DIAGNOSIS — I25810 Atherosclerosis of coronary artery bypass graft(s) without angina pectoris: Secondary | ICD-10-CM

## 2021-02-23 DIAGNOSIS — G8929 Other chronic pain: Secondary | ICD-10-CM

## 2021-02-23 DIAGNOSIS — F32A Depression, unspecified: Secondary | ICD-10-CM | POA: Diagnosis not present

## 2021-02-23 DIAGNOSIS — I480 Paroxysmal atrial fibrillation: Secondary | ICD-10-CM

## 2021-02-23 DIAGNOSIS — F419 Anxiety disorder, unspecified: Secondary | ICD-10-CM

## 2021-02-23 DIAGNOSIS — Z8673 Personal history of transient ischemic attack (TIA), and cerebral infarction without residual deficits: Secondary | ICD-10-CM

## 2021-02-23 DIAGNOSIS — Z9181 History of falling: Secondary | ICD-10-CM

## 2021-02-23 NOTE — Chronic Care Management (AMB) (Addendum)
Chronic Care Management Pharmacy Note  02/23/2021 Name:  Carmen [name] MRN:  [id number] DOB:  [birth year]  Subjective: Carmen [name] is an 80 y.o. year old female who is a primary patient of [name], Kelby Aline, FNP.  The CCM team was consulted for assistance with disease management and care coordination needs.    Engaged with patient's daughter, Carmen [name] (on [place]) by telephone for initial pharmacy visit in response to provider referral for pharmacy case management and/or care coordination services.   Consent to Services:  The patient was given information about Chronic Care Management services, agreed to services, and gave verbal consent prior to initiation of services.  Please see initial visit note for detailed documentation.   Patient Care Team: [name], Kelby Aline, FNP as PCP - General (Family Medicine) End, Harrell Gave, MD as PCP - Cardiology (Cardiology) Patient, No Pcp Per (Inactive) (General Practice) De Hollingshead, RPH-CPP (Pharmacist) Leona Singleton, RN as [place] Management  Recent office visits:  5/5 - PCP - continue current regimen, establish with psychiatry, pain management  Recent consult visits:  5/19 - neurology - continue Eliquis BID, start gabapentin 100 mg BID then increase to 200 mg BID  3/3 - cardiology [facility] (hx 4v CABG 2005, PCI w/ DES in 2006), continue Eliquis + ASA, decrease metoprolol to 50 mg daily d/t hypotension, continue amlodipine/benaz, restart atorvastatin;   Hospital visits: None in previous 6 months  Objective:  Lab Results  Component Value Date   CREATININE 0.88 11/22/2020   CREATININE 0.79 10/08/2020   CREATININE 0.70 10/07/2020    Lab Results  Component Value Date   HGBA1C 5.8 (H) 10/08/2020   Last diabetic Eye exam: No results found for: HMDIABEYEEXA  Last diabetic Foot exam: No results found for: HMDIABFOOTEX   No results found for: CHOL, TRIG, HDL, CHOLHDL, VLDL, LDLCALC, LDLDIRECT  Hepatic  Function Latest Ref Rng & Units 11/22/2020 10/08/2020 10/06/2020  Total Protein 6.0 - 8.5 g/dL 6.8 6.4(L) 6.1(L)  Albumin 3.7 - 4.7 g/dL 4.3 3.3(L) 3.3(L)  AST 0 - 40 IU/L _0 ALT 0 - 32 IU/L _1 Alk Phosphatase 44 - 121 IU/L 65 68 68  Total Bilirubin 0.0 - 1.2 mg/dL 0.3 1.2 0.8  Bilirubin, Direct 0.0 - 0.2 mg/dL - - 0.1    Lab Results  Component Value Date/Time   TSH 1.270 11/22/2020 09:44 AM   TSH 10.765 (H) 10/06/2020 04:51 PM    CBC Latest Ref Rng & Units 11/22/2020 10/08/2020 10/07/2020  WBC 3.4 - 10.8 x10E3/uL 6.2 9.8 9.3  Hemoglobin 11.1 - 15.9 g/dL 11.8 10.8(L) 10.8(L)  Hematocrit 34.0 - 46.6 % 36.6 33.1(L) 33.5(L)  Platelets 150 - 450 x10E3/uL 267 264 256    Clinical ASCVD: Yes  The ASCVD Risk score Mikey Bussing DC Jr., et al., 2013) failed to calculate for the following reasons:   The patient has a prior MI or stroke diagnosis    Other: (CHADS2VASc if Afib, PHQ9 if depression, MMRC or CAT for COPD, ACT, DEXA)  Social History   Tobacco Use  Smoking Status Former Smoker  . Types: Cigarettes  . Quit date: 05/2020  . Years since quitting: 0.8  Smokeless Tobacco Never Used  Tobacco Comment   Smoked about 1 pack per month   BP Readings from Last 3 Encounters:  02/03/21 122/64  12/22/20 96/60  12/10/20 136/86   Pulse Readings from Last 3 Encounters:  02/03/21 73  12/22/20 (!) 59  12/10/20 92  Wt Readings from Last 3 Encounters:  02/03/21 187 lb (84.8 kg)  12/22/20 178 lb (80.7 kg)  12/10/20 188 lb (85.3 kg)    Assessment: Review of patient past medical history, allergies, medications, health status, including review of consultants reports, laboratory and other test data, was performed as part of comprehensive evaluation and provision of chronic care management services.   SDOH:  (Social Determinants of Health) assessments and interventions performed:    CCM Care Plan  No Known Allergies  Medications Reviewed Today    Reviewed by De Hollingshead,  RPH-CPP (Pharmacist) on 02/23/21 at 0955  Med List Status: <None>  Medication Order Taking? Sig Documenting Provider Last Dose Status Informant  amLODipine-benazepril (LOTREL) 5-10 MG capsule [id number] Yes Take 1 capsule by mouth daily. End, Harrell Gave, MD Taking Active   apixaban (ELIQUIS) 5 MG TABS tablet [id number] Yes Take 1 tablet (5 mg total) by mouth 2 (two) times daily. End, Harrell Gave, MD Taking Active   aspirin EC 81 MG tablet [id number] Yes Take 81 mg by mouth daily. Swallow whole. [provider] Taking Active   atorvastatin (LIPITOR) 20 MG tablet [id number] Yes Take 1 tablet (20 mg total) by mouth at bedtime. Rise Mu, PA-C Taking Active   clonazePAM (KLONOPIN) 0.5 MG tablet [id number] Yes Take 1 tablet (0.5 mg total) by mouth daily as needed for anxiety. [name], Kelby Aline, FNP Taking Active            Med Note Carmen Maffucci, Arville Lime   Wed Feb 23, 2021  9:52 AM) Taking daily  docusate sodium (COLACE) 100 MG capsule [id number]  Take 300 mg by mouth daily as needed for mild constipation. [provider]  Active Child  fluconazole (DIFLUCAN) 150 MG tablet [id number] No DO NOT dispense  give to patient she will continue topical until she sees the dermatologist for further treatment,.  Patient not taking: Reported on 02/23/2021   Doreen Beam, FNP Not Taking Active   HYDROcodone-acetaminophen (NORCO/VICODIN) 5-325 MG tablet [id number] Yes Take 1-2 tablets by mouth every 8 (eight) hours as needed for severe pain (take only as needed.). [name], Kelby Aline, FNP Taking Active   ketoconazole (NIZORAL) 2 % cream [id number] No Apply 1 application topically 2 (two) times daily.  Patient not taking: Reported on 02/23/2021   Doreen Beam, FNP Not Taking Active   levothyroxine (SYNTHROID) 100 MCG tablet [id number] Yes Take 1 tablet (100 mcg total) by mouth daily before breakfast. Not to take with other medications or vitamins within 2 hours. [name], Kelby Aline, FNP  Taking Active   metoprolol succinate (TOPROL-XL) 100 MG 24 hr tablet [id number] Yes 0.5 tablet DAILY (route: oral) [provider] Taking Active            Med Note Kelby Aline Feb 23, 2021  9:52 AM)    nystatin-triamcinolone ointment [facility]) [id number] No Apply 1 application topically 2 (two) times daily.  Patient not taking: Reported on 02/23/2021   Doreen Beam, FNP Not Taking Active   traZODone (DESYREL) 50 MG tablet [id number] Yes Take 0.5 tablets (25 mg total) by mouth at bedtime as needed for sleep. Doreen Beam, FNP Taking Active            Med Note Carmen Maffucci, Arville Lime   Wed Feb 23, 2021  9:53 AM) Increased to 1 tablet  vitamin B-12 (CYANOCOBALAMIN) 500 MCG tablet [id number] Yes Take 500 mcg by mouth every Monday, Wednesday, and Friday. [provider] Taking  Active            Med Note Meyer Russel Nov 24, 2020 11:51 AM)            Patient Active Problem List   Diagnosis Date Noted  . Dry skin dermatitis 02/03/2021  . At risk for falls 02/03/2021  . Athlete's foot on right 02/03/2021  . Persistent atrial fibrillation ([place]) 11/26/2020  . Hyperlipidemia LDL goal <70 11/26/2020  . Essential hypertension 11/26/2020  . B12 deficiency 11/18/2020  . Other chronic pain 11/18/2020  . Multiple falls 11/18/2020  . Acute encephalopathy 10/06/2020  . CVA (cerebral vascular accident) ([place]) 10/06/2020  . Atrial flutter ([place]) 10/06/2020  . Depression 10/06/2020  . Peripheral vascular disease ([place]) 10/06/2020  . Hypothyroidism 10/06/2020  . CAD (coronary artery disease) 10/06/2020     There is no immunization history on file for this patient.  Conditions to be addressed/monitored: Atrial Fibrillation, CAD, HTN, HLD and Depression  Care Plan : PharmD - Medication Management  Updates made by De Hollingshead, RPH-CPP since 02/23/2021 12:00 AM    Problem: CAD, Afib, Chronic Pain, Depression     Long-Range Goal:  Disease Progression Prevention   Start Date: 02/23/2021  This Visit's Progress: On track  Priority: High  Note:   Current Barriers:  . Unable to independently monitor therapeutic efficacy . Suboptimal therapeutic regimen for chronic pain, mental health  Pharmacist Clinical Goal(s):  Marland Kitchen Over the next 90 days, patient will achieve adherence to monitoring guidelines and medication adherence to achieve therapeutic efficacy through collaboration with PharmD and provider.    Interventions: . 1:1 collaboration with [name], Kelby Aline, FNP regarding development and update of comprehensive plan of care as evidenced by provider attestation and co-signature . Inter-disciplinary care team collaboration (see longitudinal plan of care) . Comprehensive medication review performed; medication list updated in electronic medical record  Depression/Anxiety with insomnia . Uncontrolled and suboptimally managed; current treatment: trazodone 25 mg QPM - though patient increased to 50 mg QPM; clonazepam 0.5 mg QAM PRN - taking daily . Daughter reports that her mother has declined to see a psychiatrist because she doesn't want to pay the speciality copay.  . Exacerbated by recent move from independent home in [place] to [facility] here. Change in location, independence, etc. Daughter reports her mood has been significantly worse since September. Does not recall a history of other antidepressants . Offered referral to LCSW for support. Daughter would like to speak w/ LCSW. Will collaborate w/ Care Guide to have this scheduled.  . Discussed concerns with need for daily benzodiazepine given risk of CNS sedation, increased risk of opioid overdose. Consider initiation of duloxetine for mood, chronic pain moving forward.  Chronic Pain: . Uncontrolled and suboptimally managed; current regimen: hydrocodone/acetaminophen 5/325 mg 1-2 tab Q8H PRN. Reports patient is requesting more often than Q8H, but she is only  administering 2-3 doses daily. Appointment with Pain Management upcoming.  . Established with neurology, recommended to start gabapentin 100 mg BID x1 week then increase to 200 mg BID, but daughter reports this script has not been sent to the pharmacy yet. Advised to contact [facility] to request that this be re-sent. Discussed benefit of gabapentin to hopefully reduce need for opioid.  . Consider duloxetine for dual mental health and pain benefit as above.   Hyperlipidemia and secondary ASCVD prevention: . Unsure, no lipid panel on file; current treatment: atorvastatin 20 mg daily (recently restarted by cardiology)  . Antiplatelet regimen:  aspirin 81 mg daily (dual antiplatelet + anticoagulant recommended by cardiology given extensive CV hx) . Recommend lipid panel check moving forward, recommend goal LDL <70 given ASCVD hx.  . Continue to monitor for risks of falls that would result in bleeds, given dual ASA + anticoagulant.   Atrial Fibrillation: . Appropriately managed; current rate control: metoprolol succinate 50 mg daily (splitting 100 mg tab); anticoagulant treatment: Eliquis 5 mg BID (does not meet criteria for dose reduction); additional antihypertensive: amlodipine/benazepril 5/10 mg daily . Reports that home BP readings have been well controlled 100-120s/60-70s. No concern w/ tachycardia since metoprolol dose reduction.  . Continue current regimen at this time along with cardiology collaboration.  . Discussed Eliquis patient assistance. Daughter believes that patient meets income criteria, but is unsure about total drug spend. Will look into finances and we will discuss moving forward  Hypothyroidism: . Controlled per last lab; currnet regimen: levothyroxine 100 mcg QAM . Confirmed appropriate administration . Continue current regimen at this time  Hand Peeling: . Uncontrolled but recently improved per daughter report; she called dermatology and is awaiting a call back to  schedule; current regimen: ketoconazole 2% cream BID - though daughter reports that sometimes patient tries to use more frequently when she is very concerned with her hands . Discussed daughter's thought that depression/anxiety contributing to concern about patient's hands. Follow with dermatology as recommended.   Supplements: Marland Kitchen Vitamin B12 500 units daily  Patient Goals/Self-Care Activities . Over the next 90 days, patient will:  - take medications as prescribed check blood pressure periodically, document, and provide at future appointments collaborate with provider on medication access solutions  Follow Up Plan: Telephone follow up appointment with care management team member scheduled for: ~ 8 weeks       Medication Assistance: None required.  Patient affirms current coverage meets needs.  Patient's preferred pharmacy is:  [id number] [id number]: [phone]: [phone] [address] [place]Phone: [phone]: [phone]  Follow Up:  Patient agrees to Care Plan and Follow-up.  Plan: Telephone follow up appointment with care management team member scheduled for:  ~ 8 weeks  [name] TDarnelle Maffucci PharmD, BAlafaya CRichmondClinical Pharmacist LOccidental Petrole[employer] BJohnson [phone]

## 2021-02-23 NOTE — Progress Notes (Signed)
Carmen [name],  [name] states that it showed $0 copay at visit today. Do you want me to schedule patient?  Hospital doctor

## 2021-02-23 NOTE — Patient Instructions (Addendum)
[place] and [place],   It was great talking today!  Make sure you see Pain Management and Dermatology. Those are great specialists to help make sure you are getting the appropriate treatment for your pain and your hands.   I will talk to Marcelino Duster about some different medication options to hopefully help reduce how frequently you need clonazepam or hydrocodone/acetaminophen. If you haven't already, please call the neurologist to ask them to re-send the order for gabapentin.   I will ask our clinic Licensed Clinical Social Worker (trained therapist), Mardene Celeste, to give you a call. You have had a big, tough transition lately!  If your yearly income is <$40,770, and you spend a total of 3% of your yearly income on prescription copays in 2022, you would qualify to get the Eliquis for free. Look at your finances and current total out of pocket spend for prescriptions and we can talk about it moving forward.   Take care, and call me with any medication questions moving forward!  [name] Feliz Beam, PharmD [phone]    Visit Information  PATIENT GOALS: Goals Addressed              This Visit's Progress     Patient Stated   .  Medication Monitoring (pt-stated)        Patient Goals/Self-Care Activities . Over the next 90 days, patient will:  - take medications as prescribed check blood pressure periodically, document, and provide at future appointments collaborate with provider on medication access solutions       The patient verbalized understanding of instructions, educational materials, and care plan provided today and agreed to receive a mailed copy of patient instructions, educational materials, and care plan.   Plan: Telephone follow up appointment with care management team member scheduled for:  ~ 8 weeks  [name] Feliz Beam, PharmD, [place], CPP Clinical Pharmacist [employer] [facility] [employer] [phone]

## 2021-02-24 NOTE — Progress Notes (Signed)
Good morning   I have scheduled with Mardene Celeste 03/07/2021, Pt's daughter is POA and states that she will be with her mom when you call   Penne Lash, RMA Care Guide, Embedded Care Coordination [facility]  [place], [place] [address] Direct Dial: [phone] [email or web address]: [email or web address]

## 2021-02-25 ENCOUNTER — Ambulatory Visit: Payer: Medicare Other | Admitting: *Deleted

## 2021-02-25 DIAGNOSIS — R296 Repeated falls: Secondary | ICD-10-CM

## 2021-02-25 DIAGNOSIS — I639 Cerebral infarction, unspecified: Secondary | ICD-10-CM | POA: Diagnosis not present

## 2021-02-25 DIAGNOSIS — I25810 Atherosclerosis of coronary artery bypass graft(s) without angina pectoris: Secondary | ICD-10-CM

## 2021-02-25 DIAGNOSIS — Z9181 History of falling: Secondary | ICD-10-CM

## 2021-02-25 DIAGNOSIS — F32A Depression, unspecified: Secondary | ICD-10-CM

## 2021-02-25 DIAGNOSIS — G8929 Other chronic pain: Secondary | ICD-10-CM

## 2021-02-25 DIAGNOSIS — I480 Paroxysmal atrial fibrillation: Secondary | ICD-10-CM | POA: Diagnosis not present

## 2021-02-25 NOTE — Chronic Care Management (AMB) (Signed)
Chronic Care Management   CCM RN Visit Note  02/25/2021 Name: Carmen [name] MRN: [id number] DOB: 12-06-1940  Subjective: Carmen [name] is a 80 y.o. year old female who is a primary care patient of [name], [name], FNP. The care management team was consulted for assistance with disease management and care coordination needs.    Engaged with patient by telephone for follow up visit in response to provider referral for case management and/or care coordination services.   Consent to Services:  The patient was given information about Chronic Care Management services, agreed to services, and gave verbal consent prior to initiation of services.  Please see initial visit note for detailed documentation.   Patient agreed to services and verbal consent obtained.   Assessment: Review of patient past medical history, allergies, medications, health status, including review of consultants reports, laboratory and other test data, was performed as part of comprehensive evaluation and provision of chronic care management services.   SDOH (Social Determinants of Health) assessments and interventions performed:  SDOH Interventions   Flowsheet Row Most Recent Value  SDOH Interventions   Food Insecurity Interventions Intervention Not Indicated  Financial Strain Interventions Intervention Not Indicated  Housing Interventions Intervention Not Indicated  Transportation Interventions Intervention Not Indicated       CCM Care Plan  No Known Allergies  Outpatient Encounter Medications as of 02/25/2021  Medication Sig Note  . amLODipine-benazepril (LOTREL) 5-10 MG capsule Take 1 capsule by mouth daily.   Marland Kitchen apixaban (ELIQUIS) 5 MG TABS tablet Take 1 tablet (5 mg total) by mouth 2 (two) times daily.   Marland Kitchen aspirin EC 81 MG tablet Take 81 mg by mouth daily. Swallow whole.   Marland Kitchen atorvastatin (LIPITOR) 20 MG tablet Take 1 tablet (20 mg total) by mouth at bedtime.   . clonazePAM (KLONOPIN) 0.5 MG tablet Take 1  tablet (0.5 mg total) by mouth daily as needed for anxiety. 02/23/2021: Taking daily  . docusate sodium (COLACE) 100 MG capsule Take 300 mg by mouth daily as needed for mild constipation.   Marland Kitchen HYDROcodone-acetaminophen (NORCO/VICODIN) 5-325 MG tablet Take 1-2 tablets by mouth every 8 (eight) hours as needed for severe pain (take only as needed.).   Marland Kitchen levothyroxine (SYNTHROID) 100 MCG tablet Take 1 tablet (100 mcg total) by mouth daily before breakfast. Not to take with other medications or vitamins within 2 hours.   . metoprolol succinate (TOPROL-XL) 100 MG 24 hr tablet 0.5 tablet DAILY (route: oral)   . nystatin-triamcinolone ointment (MYCOLOG) Apply 1 application topically 2 (two) times daily.   . traZODone (DESYREL) 50 MG tablet Take 0.5 tablets (25 mg total) by mouth at bedtime as needed for sleep. 02/23/2021: Increased to 1 tablet  . vitamin B-12 (CYANOCOBALAMIN) 500 MCG tablet Take 500 mcg by mouth every Monday, Wednesday, and Friday.   . fluconazole (DIFLUCAN) 150 MG tablet DO NOT dispense  give to patient she will continue topical until she sees the dermatologist for further treatment,. (Patient not taking: No sig reported)   . ketoconazole (NIZORAL) 2 % cream Apply 1 application topically 2 (two) times daily. (Patient not taking: Reported on 02/25/2021)    No facility-administered encounter medications on file as of 02/25/2021.    Patient Active Problem List   Diagnosis Date Noted  . Dry skin dermatitis 02/03/2021  . At risk for falls 02/03/2021  . Athlete's foot on right 02/03/2021  . Persistent atrial fibrillation (HCC) 11/26/2020  . Hyperlipidemia LDL goal <70 11/26/2020  . Essential hypertension 11/26/2020  .  B12 deficiency 11/18/2020  . Other chronic pain 11/18/2020  . Multiple falls 11/18/2020  . Acute encephalopathy 10/06/2020  . CVA (cerebral vascular accident) (HCC) 10/06/2020  . Atrial flutter (HCC) 10/06/2020  . Depression 10/06/2020  . Peripheral vascular disease (HCC)  10/06/2020  . Hypothyroidism 10/06/2020  . CAD (coronary artery disease) 10/06/2020    Conditions to be addressed/monitored:Falls and Chronic Pain  Care Plan : Fall Risk (Adult)  Updates made by Maple Mirza, RN since 02/25/2021 12:00 AM  Problem: Fall Risk   Priority: Medium  Long-Range Goal: Absence of Fall and Fall-Related Injury   Start Date: 02/25/2021  Expected End Date: 08/31/2021  Priority: Medium  Current Barriers:  Marland Kitchen Knowledge Deficits related to fall precautions in patient with greater than 20 falls in the last year per daughter.  Denies actual injuries with falls but lots of bruising.  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  Patient now in [facility] community [facility]) and receiving home health physical therapy about twice a day per daughter.  Using Rolator walker to ambulate.  Daughter denies any falls in the last month.  Attempted to contact patient, she could not hear when she answered her telephone. . Decreased adherence to prescribed treatment for fall prevention Clinical Goal(s):  . patient will demonstrate improved adherence to prescribed treatment plan for decreasing falls as evidenced by patient reporting and review of EMR . patient will verbalize using fall risk reduction strategies discussed . patient will not experience additional falls . patient will verbalize understanding of plan for fall reduction Interventions:  . Collaboration with [name], [name], FNP regarding development and update of comprehensive plan of care as evidenced by provider attestation and co-signature . Inter-disciplinary care team collaboration (see longitudinal plan of care) . Provided written and verbal education re: Potential causes of falls and Fall prevention strategies . Reviewed medications and discussed potential side effects of medications such as dizziness and frequent urination . Assessed for falls since last  encounter. . Assessed patients knowledge of fall risk prevention secondary to previously provided education. . Assessed working status of patient call light system and patient adherence . Encouraged continued participation in home health physical therapy . Evaluation of current treatment plan related to falls and patient's adherence to plan as established by provider. . Activities of daily living skills assessed . Assistive or adaptive device use encouraged; encouraged to use Rolator walker at all times . Barriers to physical activity or exercise identified and addressed   . Barriers to safety identified . Cognitive-stimulating activities promoted; encouraged to participate in group activities at facility . Fall prevention plan reviewed and updated . Fear of falling, loss of independence and pain acknowledged . Medication list reviewed and compliance encouraged Patient Goals/Self-Care Deficits:  . Always wear low-heeled or flat shoes or slippers with nonskid soles . Keep my cell phone and call light with me always . Learn how to get back up if I fall . Make an emergency alert plan in case I fall . Pick up clutter from the floors and use a nonslip pad with throw rugs, or remove them completely . Use walker with all ambulation . Use a nightlight in the bathroom . Attend physical therapy  Follow Up Plan: The care management team will reach out to the patient again over the next 30 business days.    Care Plan : Chronic Pain (Adult)  Updates made by Maple Mirza, RN since 02/25/2021 12:00 AM  Problem: Chronic Pain Management (Chronic Pain)   Priority: Medium  Long-Range Goal: Chronic Pain Managed   Start Date: 02/25/2021  Expected End Date: 08/31/2021  Priority: Medium  Current Barriers:  Marland Kitchen Knowledge Deficits related to self-health management of chronic pain; daughter reports patient with chronic back pain and hand pain.  Reports taking pain medication as prescribed.  Just seen  Neurologist in consultation and was told to start on Neurontin but does not have prescription.  Daughter awaiting call back from provider.  Also feels mood/depression affecting pain, due to patient having to leave her home/normal environment and move to new facility.  Awaiting call from [facility] psychotherapist.  Attempted to contact patient without success. . Chronic Disease Management support and education needs related to chronic pain . Lacks social connections Clinical Goal(s):  . patient will verbalize understanding of plan for pain management. , patient will use pharmacological and nonpharmacological pain relief strategies as prescribed. , and patient will verbalize acceptable level of pain relief and ability to engage in desired activities Interventions:  . Collaboration with [name], [name], FNP regarding development and update of comprehensive plan of care as evidenced by provider attestation and co-signature . Pain assessment performed . Medications reviewed . Discussed plans with patient for ongoing care management follow up and provided patient with direct contact information for care management team . Evaluation of current treatment plan related to pain and depression and patient's adherence to plan as established by provider. . Social Work referral for mood and depression . Discussed plans with patient for ongoing care management follow up and provided patient with direct contact information for care management team . Careful application of heat or ice encouraged . Deep breathing, relaxation and mindfulness use promoted . Effectiveness of pharmacologic therapy monitored . Motivation and barriers to change assessed and addressed . Participation in physical therapy encouraged and premedication prior to activity encouraged . Attempted to determine if pain is associated with mobility or at rest, location, intensity, frequency, duration, recurrence, pattern and description (e.g., cramping,  burning, aching), triggers and relieving factors . Emotional support and empathy provided to daughter . Encouraged daughter to contact Neurologist to clarify new prescription . Encouraged increase in engagement in activities at [facility] facility and also eating meals in dining room to help engagement with other peers Patient Goals/Self Care Activities:  . Learn relaxation techniques and use during pain . Spend time with positive people; eat more in dining room . Tell myself I can (not I can't) . Think of new ways to do favorite things . Use distraction techniques . Contact Neurologist concerning new prescription for pain . Engage in more activities at [employer] . Patient will calls provider office for new concerns or questions Follow Up Plan: The care management team will reach out to the patient again over the next 30 business days.      Plan:The care management team will reach out to the patient again over the next 30 business days.  Rhae Lerner RN, MSN RN Care Management Coordinator [facility] [phone] [email or web address]

## 2021-02-25 NOTE — Patient Instructions (Signed)
Visit Information  PATIENT GOALS: Goals Addressed            This Visit's Progress   . (RNCM) Cope with Chronic Pain       Timeframe:  Long-Range Goal Priority:  Medium Start Date:  02/25/21                           Expected End Date:  08/31/21                     Follow Up Date 03/25/21    . Learn relaxation techniques and use during pain . Spend time with positive people; eat more in dining room . Tell myself I can (not I can't) . Think of new ways to do favorite things . Use distraction techniques . Contact Neurologist concerning new prescription for pain . Engage in more activities at Independent Facility     Why is this important?    Stress makes chronic pain feel worse.   Feelings like depression, anxiety, stress and anger can make your body more sensitive to pain.   Learning ways to cope with stress or depression may help you find some relief from the pain.     Notes:     Marland Kitchen (RNCM) Prevent Falls and Injury       Timeframe:  Long-Range Goal Priority:  Medium Start Date:  02/25/21                           Expected End Date:  08/31/21                     Follow Up Date 03/25/21    . Always wear low-heeled or flat shoes or slippers with nonskid soles . Keep my cell phone and call light with me always . Learn how to get back up if I fall . Make an emergency alert plan in case I fall . Pick up clutter from the floors and use a nonslip pad with throw rugs, or remove them completely . Use walker  with all ambulation . Use a nightlight in the bathroom . Attend physical therapy    Why is this important?    Most falls happen when it is hard for you to walk safely. Your balance may be off because of an illness. You may have pain in your knees, hip or other joints.   You may be overly tired or taking medicines that make you sleepy. You may not be able to see or hear clearly.   Falls can lead to broken bones, bruises or other injuries.   There are things you can do  to help prevent falling.     Notes:        The patient verbalized understanding of instructions, educational materials, and care plan provided today and declined offer to receive copy of patient instructions, educational materials, and care plan.   The care management team will reach out to the patient again over the next 30 business days.   Rhae Lerner RN, MSN RN Care Management Coordinator [facility] [phone] [email or web address]

## 2021-02-25 NOTE — Telephone Encounter (Signed)
Left message to call back. 3rd attempt to contact patient, letter has been sent with provider message.

## 2021-03-07 ENCOUNTER — Ambulatory Visit (INDEPENDENT_AMBULATORY_CARE_PROVIDER_SITE_OTHER): Payer: Medicare Other | Admitting: *Deleted

## 2021-03-07 DIAGNOSIS — F419 Anxiety disorder, unspecified: Secondary | ICD-10-CM

## 2021-03-07 DIAGNOSIS — F32A Depression, unspecified: Secondary | ICD-10-CM

## 2021-03-07 DIAGNOSIS — R296 Repeated falls: Secondary | ICD-10-CM

## 2021-03-07 DIAGNOSIS — Z9181 History of falling: Secondary | ICD-10-CM

## 2021-03-07 DIAGNOSIS — I639 Cerebral infarction, unspecified: Secondary | ICD-10-CM | POA: Diagnosis not present

## 2021-03-07 DIAGNOSIS — G8929 Other chronic pain: Secondary | ICD-10-CM

## 2021-03-07 NOTE — Patient Instructions (Signed)
Visit Information   PATIENT GOALS:  Goals Addressed              This Visit's Progress   .  COMPLETED: Track and Manage My Symptoms of Anxiety and Depression. (pt-stated)   On track     Timeframe:  Short-Term Goal Priority:  High Start Date:    03/07/2021                         Expected End Date:   03/07/2021                    Follow-Up Date:  No Follow-Up Required Per Patient and Daughter.  Patient Goals/Self-Care Activities: . Exercise at least 2 to 3 times per week, or as tolerated, and recommended by your primary provider. . Have a plan for how to handle bad days (I.e Keep a journal of what works for when dealing with difficult days). Marland Kitchen Spend time or talk with others every day to obtain socialization instead of isolating yourself. . Make new friends with residents at the independent living facility and try to engage in social activities offered that are of interest.  . Remain in contact with friends, neighbors, spiritual providers, etc., from home town, especially when experiencing symptoms of sadness and loneliness. . Continue twice weekly home health physical and occupational therapy sessions to improve strengthening and conditioning, mobility and ambulation, and develop safety techniques. . Contact LCSW directly (# [id number]) if you change your mind about wanting to receive counseling and supportive services, either through LCSW on a weekly/(bi-weekly) basis, or through a referral to a agency/provider of choice.         Consent to CCM Services: Ms. Kaczorowski was given information about Chronic Care Management services today including:  1. CCM service includes personalized support from designated clinical staff supervised by her physician, including individualized plan of care and coordination with other care providers 2. 24/7 contact phone numbers for assistance for urgent and routine care needs. 3. Service will only be billed when office clinical staff spend 20 minutes or  more in a month to coordinate care. 4. Only one practitioner may furnish and bill the service in a calendar month. 5. The patient may stop CCM services at any time (effective at the end of the month) by phone call to the office staff. 6. The patient will be responsible for cost sharing (co-pay) of up to 20% of the service fee (after annual deductible is met).  Patient agreed to services and verbal consent obtained.   Patient verbalizes understanding of instructions provided today and agrees to view in [place].   No Follow-Up Required Per Patient and Daughter.  [place] LCSW Licensed Clinical Social Worker [place]  [phone]  CLINICAL CARE PLAN: Patient Care Plan: Coronary Artery Disease (Adult)  Completed 02/25/2021  Problem Identified: Disease Progression (Coronary Artery Disease) Resolved 02/25/2021    Long-Range Goal: Disease Progression Prevented or Minimized Completed 02/25/2021  Start Date: 11/19/2020  Expected End Date: 03/19/2021  Priority: High  Note:   Current Barriers:  . Chronic Disease Management support and educational needs r/t CAD and Persistent A-Fib  Case Manager Clinical Goal(s):  Marland Kitchen Over the next 120 days, patient will not require hospitalization or emergent care d/t complications r/t cardiac disease.  Interventions:  . Collaboration with [name], Kelby Aline, FNP regarding development and update of comprehensive plan of care as evidenced by provider attestation and co-signature . Inter-disciplinary care team  collaboration (see longitudinal plan of care) . Reviewed medications with patient's daughter/caregiver. Confirmed that patient has all needed prescriptions. Encouraged to ensure she takes medications as prescribed and notify the provider if she is unable to tolerate the regimen. Encouraged to notify the care management team with concerns regarding medication management or prescription cost. . Discussed nutritional intake and importance of  adhering to a cardiac prudent/heart healthy diet. . Discussed current treatment plan. Discussed established parameters for BP and pulse along with indications for notifying a provider. Encouraged to monitor and record readings. Provided information regarding worsening s/sx that require immediate medical attention. Daughter reports monitoring her frequently. No complaints of chest pain, palpitations or shortness of breath since being in the daughter's home. Reports her mobility is limited by an unsteady gait but no complaints of fatigue or decreased activity tolerance. Advised to assess daily and notify provider with concerns. She is pending outreach with the Cardiology team.   Patient Goals/Self-Care Activities: Over the next 120 days, patient's caregiver will: -Ensure patient takes all medications as prescribed -Ensure patient adheres to a cardiac prudent/heart healthy diet -Monitor patient's BP and pulse and assess for symptoms r/t cardiac complications -Ensure patient completes outreach with the Cardiology team as scheduled -Notify provider or care management team with questions and new concerns as needed    Follow Up Plan:  Patient changed provider offices, goals updated   Patient Care Plan: Functional Decline/Impaired Mobility  Completed 02/25/2021  Problem Identified: Functional Decline Resolved 02/25/2021    Long-Range Goal: Maintain Mobility and Function Completed 02/25/2021  Start Date: 11/19/2020  Expected End Date: 03/19/2021  Priority: High  Note:    Current Barriers:  . High Risk for Falls r/t Impaired balance and Chronic back pain . Unable to perform ADLs independently . Unable to perform IADLs independently  Clinical Goal(s):  Marland Kitchen Over the next 120 days, patient will not experience falls or require emergent care d/t fall related injuries.  Interventions:  . Collaboration with [name], Kelby Aline, FNP regarding development and update of comprehensive plan of care as  evidenced by provider attestation and co-signature . Inter-disciplinary care team collaboration (see longitudinal plan of care) . Discussed falls since our last outreach. Reviewed safety and fall prevention measures. Daughter [name] reports she has been ambulating well. No falls since our last discussion. She continues to use her walker as needed when ambulating. Reports she has also been on several outings recently and did well. She received Occupational and Physical Therapy. . Discussed plan for long term care.  [name] indicated that Mrs. Florido will be moving to [facility] apartments next month. Reports that her condition has improved and feels that she is able to live independently. Confirmed that the family will be available to check on her frequently and assist as needed.    Self-Care Deficits/Patient Goals: -Utilize assistive device appropriately with all ambulation -Ensure pathways are clear and well lit -Wear secure fitting non skid footwear when ambulating -Contact provider or care management team with questions and new concerns as needed   Follow Up Plan:  Patient changed provider offices, care plan updated   Patient Care Plan: Wellness (Adult)  Completed 02/25/2021  Problem Identified: Cognitive Function (Wellness) Resolved 02/25/2021    Goal: Cognitive Function Enhanced Completed 02/25/2021  Note:   Evidence-based guidance:   Assess changes in cognitive function using standardized assessment when available.   Encourage and refer for services that stimulate thinking, problem-solving and memory, such as cognitive training and cognitive rehabilitation.   Encourage  physical activity or exercise based on ability and tolerance.   Encourage enjoyable activities that provide general stimulation for thinking, concentration and memory in a social setting or small group.   Notes:    Patient Care Plan: PharmD - Medication Management    Problem Identified: CAD, Afib, Chronic  Pain, Depression     Long-Range Goal: Disease Progression Prevention   Start Date: 02/23/2021  This Visit's Progress: On track  Priority: High  Note:   Current Barriers:  . Unable to independently monitor therapeutic efficacy . Suboptimal therapeutic regimen for chronic pain, mental health  Pharmacist Clinical Goal(s):  Marland Kitchen Over the next 90 days, patient will achieve adherence to monitoring guidelines and medication adherence to achieve therapeutic efficacy through collaboration with PharmD and provider.    Interventions: . 1:1 collaboration with [name], Kelby Aline, FNP regarding development and update of comprehensive plan of care as evidenced by provider attestation and co-signature . Inter-disciplinary care team collaboration (see longitudinal plan of care) . Comprehensive medication review performed; medication list updated in electronic medical record   SDOH: . HH with [facility] MWF once daily,  T/Th twice daily, OT and PT as well.   Depression/Anxiety with insomnia . Uncontrolled and suboptimally managed; current treatment: trazodone 25 mg QPM - though patient increased to 50 mg QPM; clonazepam 0.5 mg QAM PRN - taking daily . Daughter reports that her mother has declined to see a psychiatrist because she doesn't want to pay the speciality copay.  . Exacerbated by recent move from independent home in [place] to [facility] here. Change in location, independence, etc. Daughter reports her mood has been significantly worse since September. Does not recall a history of other antidepressants . Offered referral to LCSW for support. Daughter would like to speak w/ LCSW. Will collaborate w/ Care Guide to have this scheduled.  . Discussed concerns with need for daily benzodiazepine given risk of CNS sedation, increased risk of opioid overdose. Consider initiation of duloxetine for mood, chronic pain moving forward.  Chronic Pain: . Uncontrolled and suboptimally managed; current regimen:  hydrocodone/acetaminophen 5/325 mg 1-2 tab Q8H PRN. Reports patient is requesting more often than Q8H, but she is only administering 2-3 doses daily. Appointment with Pain Management upcoming.  . Established with neurology, recommended to start gabapentin 100 mg BID x1 week then increase to 200 mg BID, but daughter reports this script has not been sent to the pharmacy yet. Advised to contact [facility] to request that this be re-sent. Discussed benefit of gabapentin to hopefully reduce need for opioid.  . Consider duloxetine for dual mental health and pain benefit as above.   Hyperlipidemia and secondary ASCVD prevention: . Unsure, no lipid panel on file; current treatment: atorvastatin 20 mg daily (recently restarted by cardiology)  . Antiplatelet regimen: aspirin 81 mg daily (dual antiplatelet + anticoagulant recommended by cardiology given extensive CV hx) . Recommend lipid panel check moving forward, recommend goal LDL <70 given ASCVD hx.  . Continue to monitor for risks of falls that would result in bleeds, given dual ASA + anticoagulant.   Atrial Fibrillation: . Appropriately managed; current rate control: metoprolol succinate 50 mg daily (splitting 100 mg tab); anticoagulant treatment: Eliquis 5 mg BID (does not meet criteria for dose reduction); additional antihypertensive: amlodipine/benazepril 5/10 mg daily . Reports that home BP readings have been well controlled 100-120s/60-70s. No concern w/ tachycardia since metoprolol dose reduction.  . Continue current regimen at this time along with cardiology collaboration.  . Discussed Eliquis patient assistance.  Daughter believes that patient meets income criteria, but is unsure about total drug spend. Will look into finances and we will discuss moving forward  Hypothyroidism: . Controlled per last lab; currnet regimen: levothyroxine 100 mcg QAM . Confirmed appropriate administration . Continue current regimen at this time  Hand  Peeling: . Uncontrolled but recently improved per daughter report; she called dermatology and is awaiting a call back to schedule; current regimen: ketoconazole 2% cream BID - though daughter reports that sometimes patient tries to use more frequently when she is very concerned with her hands . Discussed daughter's thought that depression/anxiety contributing to concern about patient's hands. Follow with dermatology as recommended.   Supplements: Marland Kitchen Vitamin B12 500 units daily  Patient Goals/Self-Care Activities . Over the next 90 days, patient will:  - take medications as prescribed check blood pressure periodically, document, and provide at future appointments collaborate with provider on medication access solutions  Follow Up Plan: Telephone follow up appointment with care management team member scheduled for: ~ 8 weeks     Patient Care Plan: Fall Risk (Adult)    Problem Identified: Fall Risk   Priority: Medium    Long-Range Goal: Absence of Fall and Fall-Related Injury   Start Date: 02/25/2021  Expected End Date: 08/31/2021  Priority: Medium  Note:   Current Barriers:  Marland Kitchen Knowledge Deficits related to fall precautions in patient with greater than 20 falls in the last year per daughter.  Denies actual injuries with falls but lots of bruising.  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  Patient now in retirement community ([facility]) and receiving home health physical therapy about twice a day per daughter.  Using Rolator walker to ambulate.  Daughter denies any falls in the last month.  Attempted to contact patient, she could not hear when she answered her telephone. . Decreased adherence to prescribed treatment for fall prevention Clinical Goal(s):  . patient will demonstrate improved adherence to prescribed treatment plan for decreasing falls as evidenced by patient reporting and review of EMR . patient will verbalize using  fall risk reduction strategies discussed . patient will not experience additional falls . patient will verbalize understanding of plan for fall reduction Interventions:  . Collaboration with [name], Kelby Aline, FNP regarding development and update of comprehensive plan of care as evidenced by provider attestation and co-signature . Inter-disciplinary care team collaboration (see longitudinal plan of care) . Provided written and verbal education re: Potential causes of falls and Fall prevention strategies . Reviewed medications and discussed potential side effects of medications such as dizziness and frequent urination . Assessed for falls since last encounter. . Assessed patients knowledge of fall risk prevention secondary to previously provided education. . Assessed working status of patient call light system and patient adherence . Encouraged continued participation in home health physical therapy . Evaluation of current treatment plan related to falls and patient's adherence to plan as established by provider. . Activities of daily living skills assessed . Assistive or adaptive device use encouraged; encouraged to use Rolator walker at all times . Barriers to physical activity or exercise identified and addressed   . Barriers to safety identified . Cognitive-stimulating activities promoted; encouraged to participate in group activities at facility . Fall prevention plan reviewed and updated . Fear of falling, loss of independence and pain acknowledged . Medication list reviewed and compliance encouraged Patient Goals/Self-Care Deficits:  . Always wear low-heeled or flat shoes or slippers with nonskid soles .  Keep my cell phone and call light with me always . Learn how to get back up if I fall . Make an emergency alert plan in case I fall . Pick up clutter from the floors and use a nonslip pad with throw rugs, or remove them completely . Use walker with all ambulation . Use a  nightlight in the bathroom . Attend physical therapy  Follow Up Plan: The care management team will reach out to the patient again over the next 30 business days.     Patient Care Plan: Chronic Pain (Adult)    Problem Identified: Chronic Pain Management (Chronic Pain)   Priority: Medium    Long-Range Goal: Chronic Pain Managed   Start Date: 02/25/2021  Expected End Date: 08/31/2021  Priority: Medium  Note:   Current Barriers:  Marland Kitchen Knowledge Deficits related to self-health management of chronic pain; daughter reports patient with chronic back pain and hand pain.  Reports taking pain medication as prescribed.  Just seen Neurologist in consultation and was told to start on Neurontin but does not have prescription.  Daughter awaiting call back from provider.  Also feels mood/depression affecting pain, due to patient having to leave her home/normal environment and move to new facility.  Awaiting call from [place].  Attempted to contact patient without success. . Chronic Disease Management support and education needs related to chronic pain . Lacks social connections Clinical Goal(s):  . patient will verbalize understanding of plan for pain management. , patient will use pharmacological and nonpharmacological pain relief strategies as prescribed. , and patient will verbalize acceptable level of pain relief and ability to engage in desired activities Interventions:  . Collaboration with [name], Kelby Aline, FNP regarding development and update of comprehensive plan of care as evidenced by provider attestation and co-signature . Pain assessment performed . Medications reviewed . Discussed plans with patient for ongoing care management follow up and provided patient with direct contact information for care management team . Evaluation of current treatment plan related to pain and depression and patient's adherence to plan as established by provider. . Social Work referral for mood and  depression . Discussed plans with patient for ongoing care management follow up and provided patient with direct contact information for care management team . Careful application of heat or ice encouraged . Deep breathing, relaxation and mindfulness use promoted . Effectiveness of pharmacologic therapy monitored . Motivation and barriers to change assessed and addressed . Participation in physical therapy encouraged and premedication prior to activity encouraged . Attempted to determine if pain is associated with mobility or at rest, location, intensity, frequency, duration, recurrence, pattern and description (e.g., cramping, burning, aching), triggers and relieving factors . Emotional support and empathy provided to daughter . Encouraged daughter to contact Neurologist to clarify new prescription . Encouraged increase in engagement in activities at [place] and also eating meals in dining room to help engagement with other peers Patient Goals/Self Care Activities:  . Learn relaxation techniques and use during pain . Spend time with positive people; eat more in dining room . Tell myself I can (not I can't) . Think of new ways to do favorite things . Use distraction techniques . Contact Neurologist concerning new prescription for pain . Engage in more activities at [employer] . Patient will calls provider office for new concerns or questions Follow Up Plan: The care management team will reach out to the patient again over the next 30 business days.    Patient Care Plan:  LCSW Plan of Care    Problem Identified: Track and Manage My Symptoms of Anxiety and Depression. Resolved 03/07/2021  Priority: High    Goal: Track and Manage My Symptoms of Anxiety and Depression. Completed 03/07/2021  Start Date: 03/07/2021  Expected End Date: 03/07/2021  This Visit's Progress: On track  Priority: High  Note:   Current Barriers:  . Acute Mental Health needs related to recent Major Life  Transition, Depression and Anxiety. . Limited Social Support, Medication Procurement, Mental Health Concerns, Social Isolation, Limited Access to Caregiver, Memory Deficits, and Lacks Knowledge of [employer]. . Suicidal Ideation/Homicidal Ideation: No Clinical Social Work Goal(s):  Marland Kitchen Patient reported "feeling much better", adjusting more nicely to her new environment and place of residence and that she is attending therapies (both physical and occupational), twice weekly, and adamantly denies the need for counseling and/or supportive services at this time, with LCSW, or through a referral to a community agency or provider. . Patient agreed to take her psychotropic medications exactly as prescribed, and discuss with her prescribing provider, also her Family Nurse Practitioner, Laverna Peace, if she experiences side effects to the medication, or if she thinks the dosage needs to be adjusted.  Please do not take more or less of the medication prescribed, nor abruptly stop taking the medication, without first consulting with your provider. Interventions: . Patient interviewed and appropriate assessments performed (PHQ 2 and PHQ 9 Depression Screening Tool) and results reviewed with patient and daughter. Marland Kitchen 1:1 collaboration with Family Nurse Practitioner, Laverna Peace, regarding development and update of comprehensive plan of care as evidenced by provider attestation and co-signature. . Provided mental health counseling with regards to Major Life Transition, Depression and Anxiety. . Provided patient with information about available mental health providers in her community. . Advised patient to consider counseling for symptoms of Depression and Anxiety. . Assisted patient's daughter with obtaining information about health plan benefits. . Provided:  Solution-Focused Strategies, Taught and Encouraged to Incorporate into Daily Practice, Deep Breathing Exercises, Relaxation Techniques and  Mindfulness Meditation Strategies, Active Listening/Reflection Utilized, Emotional Support Provided, Problem Solving/Task Centered Solutions Developed, Psychoeducation/Health Education Promoted, Brief Cognitive Behavioral Therapy Initiated, Reviewed Mental Health Medications with Patient and Daughter, and Discussed Compliance, Quality of Sleep Assessed and Sleep Hygiene Techniques Promoted, Participation in Counseling Encouraged, Self-Enrollment and Participation in Support Group Encouraged, Verbalization of Feelings Promoted, Suicidal Ideation/Homicidal Ideation Assessed, and PHQ 2 and PHQ 9 Depression Screening Tool Completed and Reviewed with Patient and Daughter. Patient Self-Care Activities:  . Self-administers medications as prescribed. . Attends all scheduled provider appointments. . Calls pharmacy for medication refills. . Attends church or other social activities. . Performs ADL's independently. Marland Kitchen Performs IADL's independently. . Calls provider office for new concerns or questions. . Lives independently. Patient Coping Strengths:  . Family Supportive. [facility]. Marland Kitchen Spirituality Connected. . Self-Advocate. Patient Goals: . Exercise at least 2 to 3 times per week, or as tolerated, and recommended by your primary provider. . Have a plan for how to handle bad days (I.e Keep a journal of what works for when dealing with difficult days). Marland Kitchen Spend time or talk with others every day to obtain socialization instead of isolating yourself. . Make new friends with residents at the independent living facility and try to engage in social activities offered that are of interest.  . Remain in contact with friends, neighbors, spiritual providers, etc., from home town, especially when experiencing symptoms of sadness and loneliness. . Continue twice weekly home health  physical and occupational therapy sessions to improve strengthening and conditioning, mobility and ambulation, and develop safety  techniques. . Contact LCSW directly (# [id number]) if you change your mind about wanting to receive counseling and supportive services, either through LCSW on a weekly/(bi-weekly) basis, or through a referral to a agency/provider of choice. Follow-Up Plan:  No Follow-Up Required Per Patient and Daughter.

## 2021-03-07 NOTE — Chronic Care Management (AMB) (Signed)
Chronic Care Management    Clinical Social Work Note  03/07/2021 Name: Carmen [name] MRN: [id number] DOB: 11/06/1940  Carmen [name] is a 80 y.o. year old female who is a primary care patient of [name], Carmen Fried, FNP. The CCM team was consulted to assist the patient with chronic disease management and/or care coordination needs related to: [facility] and Mental Health Counseling and Resources in Patient with Coronary Artery Disease, Hypertension, Cerebral Vascular Accident, Depression and Anxiety.   Engaged with patient and daughter by telephone for initial visit in response to provider referral for social work chronic care management and care coordination services.   Consent to Services:  The patient was given information about Chronic Care Management services, agreed to services, and gave verbal consent prior to initiation of services.  Please see initial visit note for detailed documentation.   Patient agreed to services and consent obtained.   Assessment: Review of patient past medical history, allergies, medications, and health status, including review of relevant consultants reports was performed today as part of a comprehensive evaluation and provision of chronic care management and care coordination services.     SDOH (Social Determinants of Health) assessments and interventions performed:    Advanced Directives Status: See Care Plan for related entries. (Advanced Directives Already in Place).  CCM Care Plan  No Known Allergies  Outpatient Encounter Medications as of 03/07/2021  Medication Sig Note  . amLODipine-benazepril (LOTREL) 5-10 MG capsule Take 1 capsule by mouth daily.   Marland Kitchen apixaban (ELIQUIS) 5 MG TABS tablet Take 1 tablet (5 mg total) by mouth 2 (two) times daily.   Marland Kitchen aspirin EC 81 MG tablet Take 81 mg by mouth daily. Swallow whole.   Marland Kitchen atorvastatin (LIPITOR) 20 MG tablet Take 1 tablet (20 mg total) by mouth at bedtime.   . clonazePAM (KLONOPIN) 0.5 MG tablet  Take 1 tablet (0.5 mg total) by mouth daily as needed for anxiety. 02/23/2021: Taking daily  . docusate sodium (COLACE) 100 MG capsule Take 300 mg by mouth daily as needed for mild constipation.   . fluconazole (DIFLUCAN) 150 MG tablet DO NOT dispense  give to patient she will continue topical until she sees the dermatologist for further treatment,. (Patient not taking: No sig reported)   . HYDROcodone-acetaminophen (NORCO/VICODIN) 5-325 MG tablet Take 1-2 tablets by mouth every 8 (eight) hours as needed for severe pain (take only as needed.).   Marland Kitchen ketoconazole (NIZORAL) 2 % cream Apply 1 application topically 2 (two) times daily. (Patient not taking: Reported on 02/25/2021)   . levothyroxine (SYNTHROID) 100 MCG tablet Take 1 tablet (100 mcg total) by mouth daily before breakfast. Not to take with other medications or vitamins within 2 hours.   . metoprolol succinate (TOPROL-XL) 100 MG 24 hr tablet 0.5 tablet DAILY (route: oral)   . nystatin-triamcinolone ointment (MYCOLOG) Apply 1 application topically 2 (two) times daily.   . traZODone (DESYREL) 50 MG tablet Take 0.5 tablets (25 mg total) by mouth at bedtime as needed for sleep. 02/23/2021: Increased to 1 tablet  . vitamin B-12 (CYANOCOBALAMIN) 500 MCG tablet Take 500 mcg by mouth every Monday, Wednesday, and Friday.    No facility-administered encounter medications on file as of 03/07/2021.    Patient Active Problem List   Diagnosis Date Noted  . Dry skin dermatitis 02/03/2021  . At risk for falls 02/03/2021  . Athlete's foot on right 02/03/2021  . Persistent atrial fibrillation (HCC) 11/26/2020  . Hyperlipidemia LDL goal <70 11/26/2020  . Essential  hypertension 11/26/2020  . B12 deficiency 11/18/2020  . Other chronic pain 11/18/2020  . Multiple falls 11/18/2020  . Acute encephalopathy 10/06/2020  . CVA (cerebral vascular accident) (HCC) 10/06/2020  . Atrial flutter (HCC) 10/06/2020  . Depression 10/06/2020  . Peripheral vascular disease  (HCC) 10/06/2020  . Hypothyroidism 10/06/2020  . CAD (coronary artery disease) 10/06/2020    Conditions to be addressed/monitored: Coronary Artery Disease, Hypertension, Cerebral Vascular Accident, Depression and Anxiety. Limited Social Support, Mental Health Concerns, Social Isolation, Limited Access to Caregiver and Memory Deficits.  Care Plan : LCSW Plan of Care  Updates made by Karolee Stamps, LCSW since 03/07/2021 12:00 AM    Problem: Track and Manage My Symptoms of Anxiety and Depression. Resolved 03/07/2021  Priority: High    Goal: Track and Manage My Symptoms of Anxiety and Depression. Completed 03/07/2021  Start Date: 03/07/2021  Expected End Date: 03/07/2021  This Visit's Progress: On track  Priority: High  Note:   Current Barriers:  . Acute Mental Health needs related to recent Major Life Transition, Depression and Anxiety. . Limited Social Support, Medication Procurement, Mental Health Concerns, Social Isolation, Limited Access to Caregiver, Memory Deficits, and Lacks Knowledge of [facility]. . Suicidal Ideation/Homicidal Ideation: No Clinical Social Work Goal(s):  Marland Kitchen Patient reported "feeling much better", adjusting more nicely to her new environment and place of residence and that she is attending therapies (both physical and occupational), twice weekly, and adamantly denies the need for counseling and/or supportive services at this time, with LCSW, or through a referral to a community agency or provider. . Patient agreed to take her psychotropic medications exactly as prescribed, and discuss with her prescribing provider, also her Family Nurse Practitioner, Marvell Fuller, if she experiences side effects to the medication, or if she thinks the dosage needs to be adjusted.  Please do not take more or less of the medication prescribed, nor abruptly stop taking the medication, without first consulting with your provider. Interventions: . Patient interviewed and  appropriate assessments performed (PHQ 2 and PHQ 9 Depression Screening Tool) and results reviewed with patient and daughter. Marland Kitchen 1:1 collaboration with Family Nurse Practitioner, Marvell Fuller, regarding development and update of comprehensive plan of care as evidenced by provider attestation and co-signature. . Provided mental health counseling with regards to Major Life Transition, Depression and Anxiety. . Provided patient with information about available mental health providers in her community. . Advised patient to consider counseling for symptoms of Depression and Anxiety. . Assisted patient's daughter with obtaining information about health plan benefits. . Provided:  Solution-Focused Strategies, Taught and Encouraged to Incorporate into Daily Practice, Deep Breathing Exercises, Relaxation Techniques and Mindfulness Meditation Strategies, Active Listening/Reflection Utilized, Emotional Support Provided, Problem Solving/Task Centered Solutions Developed, Psychoeducation/Health Education Promoted, Brief Cognitive Behavioral Therapy Initiated, Reviewed Mental Health Medications with Patient and Daughter, and Discussed Compliance, Quality of Sleep Assessed and Sleep Hygiene Techniques Promoted, Participation in Counseling Encouraged, Self-Enrollment and Participation in Support Group Encouraged, Verbalization of Feelings Promoted, Suicidal Ideation/Homicidal Ideation Assessed, and PHQ 2 and PHQ 9 Depression Screening Tool Completed and Reviewed with Patient and Daughter. Patient Self-Care Activities:  . Self-administers medications as prescribed. . Attends all scheduled provider appointments. . Calls pharmacy for medication refills. . Attends church or other social activities. . Performs ADL's independently. Marland Kitchen Performs IADL's independently. . Calls provider office for new concerns or questions. . Lives independently. Patient Coping Strengths:  . Family Supportive. [facility]. Marland Kitchen Spirituality Connected. . Self-Advocate. Patient Goals: . Exercise  at least 2 to 3 times per week, or as tolerated, and recommended by your primary provider. . Have a plan for how to handle bad days (I.e Keep a journal of what works for when dealing with difficult days). Marland Kitchen Spend time or talk with others every day to obtain socialization instead of isolating yourself. . Make new friends with residents at the independent living facility and try to engage in social activities offered that are of interest.  . Remain in contact with friends, neighbors, spiritual providers, etc., from home town, especially when experiencing symptoms of sadness and loneliness. . Continue twice weekly home health physical and occupational therapy sessions to improve strengthening and conditioning, mobility and ambulation, and develop safety techniques. . Contact LCSW directly (# [phone]) if you change your mind about wanting to receive counseling and supportive services, either through LCSW on a weekly/(bi-weekly) basis, or through a referral to a agency/provider of choice. Follow-Up Plan:  No Follow-Up Required Per Patient and Daughter.       Follow Up Plan: No Follow-Up Required Per Patient and Daughter.      Danford Bad LCSW Licensed Clinical Social Worker [facility]  [phone]

## 2021-03-13 NOTE — Progress Notes (Signed)
Patient: Carmen [name]  Service Category: E/M  Provider: Gaspar Cola, MD  DOB: 10/10/1940  DOS: 03/14/2021  Referring Provider: Sharmon Leyden*  MRN: [id number]  Setting: Ambulatory outpatient  PCP: Doreen Beam, FNP  Type: New Patient  Specialty: Interventional Pain Management    Location: Office  Delivery: Face-to-face     Primary Reason(s) for Visit: Encounter for initial evaluation of one or more chronic problems (new to examiner) potentially causing chronic pain, and posing a threat to normal musculoskeletal function. (Level of risk: High) CC: Back Pain (Low, mid and upper)  HPI  Carmen [name] is a 80 y.o. year old, female patient, who comes for the first time to our practice referred by Doreen Beam, F* for our initial evaluation of her chronic pain. She has Acute encephalopathy; CVA (cerebral vascular accident) ([place]); Atrial flutter ([place]); Depression, unspecified; Peripheral vascular disease ([place]); Acquired hypothyroidism; Athscl heart disease of native coronary artery w/o ang pctrs; Vitamin B deficiency, unspecified; Other chronic pain; Falls frequently; Paroxysmal atrial fibrillation ([place]); Hyperlipidemia, unspecified; Essential hypertension; Dry skin dermatitis; At risk for falls; Athlete's foot on right; Personal history of nicotine dependence; Prsnl hx of TIA (TIA), and cereb infrc w/o resid deficits; Anemia, unspecified; Chronic low back pain (1ry area of Pain) (Bilateral) (L>R) w/o sciatica; Difficulty sleeping; Dorsalgia, unspecified; History of stroke; Chronic anticoagulation (Eliquis); Presence of aortocoronary bypass graft; Presence of coronary angioplasty implant and graft; Repeated falls; Chronic pain syndrome; Pharmacologic therapy; Disorder of skeletal system; Problems influencing health status; Cervicalgia; Chronic neck pain (2ry area of Pain) (Bilateral) (L>R); Chronic lower extremity pain (3ry area of Pain) (Bilateral) (L>R); Chronic thigh pain (Bilateral);  Chronic hand pain (Left); and Chronic hand pain (Right) on their problem list. Today she comes in for evaluation of her Back Pain (Low, mid and upper)  Pain Assessment: Location: Lower, Mid, Upper Back Radiating: anterior thighs Onset: More than a month ago Duration: Chronic pain Quality: Throbbing, Stabbing Severity: 6 /10 (subjective, self-reported pain score)  Effect on ADL:   Timing: Intermittent Modifying factors: heat BP: 117/72  HR: 89  Onset and Duration: Gradual and Present longer than 3 months Cause of pain: Arthritis Severity: Getting better, No change since onset, NAS-11 at its worse: 10/10, NAS-11 at its best: 5/10, NAS-11 now: 6/10, and NAS-11 on the average: 5/10 Timing: Night and During activity or exercise Aggravating Factors: Kneeling, Lifiting, Motion, Prolonged standing, Squatting, Stooping , Twisting, Walking, Walking uphill, and Walking downhill Alleviating Factors: Hot packs, Medications, Resting, and Warm showers or baths Associated Problems: Depression, Dizziness, Fatigue, Inability to control bladder (urine), Nausea, Spasms, Weakness, Pain that wakes patient up, and Pain that does not allow patient to sleep Quality of Pain: Disabling, Distressing, Dreadful, Exhausting, Fearful, Horrible, Nagging, Sharp, Shooting, Stabbing, Throbbing, and Uncomfortable Previous Examinations or Tests: CT scan and MRI scan Previous Treatments: Epidural steroid injections, Narcotic medications, and Physical Therapy  The patient comes into the clinic today accompanied by one of her daughters.  According to them the primary area of pain is that of the lower back (Bilateral) (L>R).  He denies any prior back surgeries, recent x-rays or imaging, she does admit to having had some physical therapy while she was a resident in [place].  She also indicates having had acupuncture which did not work and apparently there were some issues with bleeding secondary to her blood thinners.   Apparently she had a single lumbar epidural steroid injection done in [facility] around October 2021.  This apparently  did help her pain.  She was scheduled to have a series of 3, but her physician contracted COVID and had to cancel the rest of the treatments.  She had this injections done [facility] by Dr. Nadara Mustard, a pain medicine specialist in [facility].  The patient secondary area of pain is that of the neck and the posterior aspect (Bilateral) (L>R).  They deny any surgery, but they indicate having had some x-rays done in [facility] and [place], neither of which are available.  The patient indicates having had some "stapling" in the cervical region.  She does indicate having intermittent distal upper extremity numbness, bilaterally.  She indicates that when she gets that numbness, she simply shakes the arms and it goes away.  The patient's third area pain is that of her lower extremities (Bilateral) (L>R).  In the case of the left upper extremity the pain goes down to the ankle through the anterior and posterior aspect of the leg but does not go into the foot.  In the case of the right lower extremity pain it runs to the back of the knee and the anterior thigh.  In fact, the patient indicates having pain in the anterior thigh on both lower extremities.  She denies any surgeries, recent x-rays, or physical therapy of that area.  No interventional therapies for pain in the lower extremities.  The patient is fourth area pain is that of the upper extremities where she is experiencing pain in both hands (Bilateral).  She indicates having arthritis of both hands.  She denies pain in the shoulders or elbows.  She also denies pain in the wrists or forearms.  The patient is fifth area pain if that of the shoulders, bilaterally, and upper back, which is intermittent and she currently has no pain in that area.  No history of x-rays, prior surgery, or injection  therapies in those areas.  The patient in addition has multiple medical problems including a history of a prior stroke around September 2021 with memory deficits and balance issues.  She has been falling a lot secondary to that prior stroke.  She is also on Eliquis and ASA.  She was being prescribed some Vicodin by her nurse practitioner (PCP).  She appears to have been on physical therapy since March 2022 for this balance problem.  She apparently resides in an independent living facility by herself.  Her caregiver he is one of her daughters, but apparently there are some family issues with the other daughter that was taking care of her in [place].  Today I took the time to provide the patient with information regarding my pain practice. The patient was informed that my practice is divided into two sections: an interventional pain management section, as well as a completely separate and distinct medication management section. I explained that I have procedure days for my interventional therapies, and evaluation days for follow-ups and medication management. Because of the amount of documentation required during both, they are kept separated. This means that there is the possibility that she may be scheduled for a procedure on one day, and medication management the next. I have also informed her that because of staffing and facility limitations, I no longer take patients for medication management only. To illustrate the reasons for this, I gave the patient the example of surgeons, and how inappropriate it would be to refer a patient to his/her care, just to write for the post-surgical antibiotics  on a surgery done by a different surgeon.   Because interventional pain management is my board-certified specialty, the patient was informed that joining my practice means that they are open to any and all interventional therapies. I made it clear that this does not mean that they will be forced to have any procedures  done. What this means is that I believe interventional therapies to be essential part of the diagnosis and proper management of chronic pain conditions. Therefore, patients not interested in these interventional alternatives will be better served under the care of a different practitioner.  The patient was also made aware of my Comprehensive Pain Management Safety Guidelines where by joining my practice, they limit all of their nerve blocks and joint injections to those done by our practice, for as long as we are retained to manage their care.   Historic Controlled Substance Pharmacotherapy Review  PMP and historical list of controlled substances: Hydrocodone/APAP 5/325, 1 tab p.o. every 4 hours; clonazepam 0.5 mg 1 daily Current opioid analgesics: Hydrocodone/APAP 5/325, 1 tab p.o. every 4 hours (30 mg/day of hydrocodone) (30 MME) (last filled on 02/20/2021) MME/day: 30 mg/day  Historical Monitoring: The patient  reports no history of drug use. List of all UDS Test(s): Lab Results  Component Value Date   COCAINSCRNUR NONE DETECTED 10/07/2020   THCU NONE DETECTED 10/07/2020   List of other Serum/Urine Drug Screening Test(s):  Lab Results  Component Value Date   COCAINSCRNUR NONE DETECTED 10/07/2020   THCU NONE DETECTED 10/07/2020   Historical Background Evaluation: [place] PMP: PDMP reviewed during this encounter. Online review of the past 27-monthperiod conducted.             PMP NARX Score Report:  Narcotic: 390 Sedative: 360 Stimulant: 000 [place] Department of public safety, offender search: (Editor, commissioningInformation) Non-contributory Risk Assessment Profile: Aberrant behavior: None observed or detected today Risk factors for fatal opioid overdose: None identified today PMP NARX Overdose Risk Score: 360 Fatal overdose hazard ratio (HR): Calculation deferred Non-fatal overdose hazard ratio (HR): Calculation deferred Risk of opioid abuse or dependence: 0.7-3.0% with doses ? 36 MME/day and  6.1-26% with doses ? 120 MME/day. Substance use disorder (SUD) risk level: See below Personal History of Substance Abuse (SUD-Substance use disorder):  Alcohol:    Illegal Drugs:    Rx Drugs:    ORT Risk Level calculation:    ORT Scoring interpretation table:  Score <3 = Low Risk for SUD  Score between 4-7 = Moderate Risk for SUD  Score >8 = High Risk for Opioid Abuse   PHQ-2 Depression Scale:  Total score:    PHQ-2 Scoring interpretation table: (Score and probability of major depressive disorder)  Score 0 = No depression  Score 1 = 15.4% Probability  Score 2 = 21.1% Probability  Score 3 = 38.4% Probability  Score 4 = 45.5% Probability  Score 5 = 56.4% Probability  Score 6 = 78.6% Probability   PHQ-9 Depression Scale:  Total score:    PHQ-9 Scoring interpretation table:  Score 0-4 = No depression  Score 5-9 = Mild depression  Score 10-14 = Moderate depression  Score 15-19 = Moderately severe depression  Score 20-27 = Severe depression (2.4 times higher risk of SUD and 2.89 times higher risk of overuse)   Pharmacologic Plan: As per protocol, I have not taken over any controlled substance management, pending the results of ordered tests and/or consults.            Initial impression:  Pending review of available data and ordered tests.  Meds   Current Outpatient Medications:    amLODipine-benazepril (LOTREL) 5-10 MG capsule, Take 1 capsule by mouth daily., Disp: 90 capsule, Rfl: 1   apixaban (ELIQUIS) 5 MG TABS tablet, Take 1 tablet (5 mg total) by mouth 2 (two) times daily., Disp: 60 tablet, Rfl:    aspirin EC 81 MG tablet, Take 81 mg by mouth daily. Swallow whole., Disp: , Rfl:    atorvastatin (LIPITOR) 20 MG tablet, Take 1 tablet (20 mg total) by mouth at bedtime., Disp: 30 tablet, Rfl: 3   clonazePAM (KLONOPIN) 0.5 MG tablet, Take 1 tablet (0.5 mg total) by mouth daily as needed for anxiety., Disp: 30 tablet, Rfl: 0   fluconazole (DIFLUCAN) 150 MG tablet, DO NOT dispense   give to patient she will continue topical until she sees the dermatologist for further treatment,. (Patient taking differently: DO NOT dispense  give to patient she will continue topical until she sees the dermatologist for further treatment,.), Disp: 1 tablet, Rfl: 0   HYDROcodone-acetaminophen (NORCO/VICODIN) 5-325 MG tablet, Take 1-2 tablets by mouth every 8 (eight) hours as needed for severe pain (take only as needed.)., Disp: 90 tablet, Rfl: 0   levothyroxine (SYNTHROID) 100 MCG tablet, Take 1 tablet (100 mcg total) by mouth daily before breakfast. Not to take with other medications or vitamins within 2 hours., Disp: 90 tablet, Rfl: 3   metoprolol succinate (TOPROL-XL) 100 MG 24 hr tablet, 50 mg., Disp: , Rfl:    nystatin-triamcinolone ointment (MYCOLOG), Apply 1 application topically 2 (two) times daily., Disp: 30 g, Rfl: 0   traZODone (DESYREL) 50 MG tablet, Take 0.5 tablets (25 mg total) by mouth at bedtime as needed for sleep., Disp: 90 tablet, Rfl: 1   vitamin B-12 (CYANOCOBALAMIN) 500 MCG tablet, Take 500 mcg by mouth every Monday, Wednesday, and Friday., Disp: , Rfl:   Imaging Review   Complexity Note: No results found under the [employer] electronic medical record.                        ROS  Cardiovascular: Heart trouble, Heart surgery, and Heart catheterization Pulmonary or Respiratory: Snoring  Neurological: Stroke (Residual deficits or weakness: Memory impairment) Psychological-Psychiatric: Anxiousness, Depressed, and Prone to panicking Gastrointestinal: No reported gastrointestinal signs or symptoms such as vomiting or evacuating blood, reflux, heartburn, alternating episodes of diarrhea and constipation, inflamed or scarred liver, or pancreas or irrregular and/or infrequent bowel movements Genitourinary: No reported renal or genitourinary signs or symptoms such as difficulty voiding or producing urine, peeing blood, non-functioning kidney, kidney stones, difficulty emptying  the bladder, difficulty controlling the flow of urine, or chronic kidney disease Hematological: No reported hematological signs or symptoms such as prolonged bleeding, low or poor functioning platelets, bruising or bleeding easily, hereditary bleeding problems, low energy levels due to low hemoglobin or being anemic Endocrine: Slow thyroid Rheumatologic: Rheumatoid arthritis Musculoskeletal: Negative for myasthenia gravis, muscular dystrophy, multiple sclerosis or malignant hyperthermia Work History: Retired  Allergies  Ms. Lader has No Known Allergies.  Laboratory Chemistry Profile   Renal Lab Results  Component Value Date   BUN 11 11/22/2020   CREATININE 0.88 11/22/2020   BCR 13 11/22/2020   GFRAA 73 11/22/2020   GFRNONAA 63 11/22/2020   PROTEINUR 30 (A) 10/07/2020     Electrolytes Lab Results  Component Value Date   NA 137 11/22/2020   K 4.9 11/22/2020   CL 101 11/22/2020  CALCIUM 9.5 11/22/2020     Hepatic Lab Results  Component Value Date   AST 15 11/22/2020   ALT 10 11/22/2020   ALBUMIN 4.3 11/22/2020   ALKPHOS 65 11/22/2020   AMMONIA 19 10/06/2020     ID Lab Results  Component Value Date   SARSCOV2NAA NEGATIVE 10/12/2020     Bone No results found for: VD25OH, QB341PF7TKW, IO9735HG9, JM4268TM1, 25OHVITD1, 25OHVITD2, 25OHVITD3, TESTOFREE, TESTOSTERONE   Endocrine Lab Results  Component Value Date   GLUCOSE 89 11/22/2020   GLUCOSEU NEGATIVE 10/07/2020   HGBA1C 5.8 (H) 10/08/2020   TSH 1.270 11/22/2020     Neuropathy Lab Results  Component Value Date   VITAMINB12 475 11/22/2020   FOLATE 10.7 11/22/2020   HGBA1C 5.8 (H) 10/08/2020     CNS No results found for: COLORCSF, APPEARCSF, RBCCOUNTCSF, WBCCSF, POLYSCSF, LYMPHSCSF, EOSCSF, PROTEINCSF, GLUCCSF, JCVIRUS, CSFOLI, IGGCSF, LABACHR, ACETBL, LABACHR, ACETBL   Inflammation (CRP: Acute  ESR: Chronic) No results found for: CRP, ESRSEDRATE, LATICACIDVEN   Rheumatology No results found for: RF,  ANA, LABURIC, URICUR, LYMEIGGIGMAB, LYMEABIGMQN, HLAB27   Coagulation Lab Results  Component Value Date   INR 1.2 10/06/2020   LABPROT 14.6 10/06/2020   APTT 28 10/06/2020   PLT 267 11/22/2020     Cardiovascular Lab Results  Component Value Date   HGB 11.8 11/22/2020   HCT 36.6 11/22/2020     Screening Lab Results  Component Value Date   SARSCOV2NAA NEGATIVE 10/12/2020     Cancer No results found for: CEA, CA125, LABCA2   Allergens No results found for: ALMOND, APPLE, ASPARAGUS, AVOCADO, BANANA, BARLEY, BASIL, BAYLEAF, GREENBEAN, LIMABEAN, WHITEBEAN, BEEFIGE, REDBEET, BLUEBERRY, BROCCOLI, CABBAGE, MELON, CARROT, CASEIN, CASHEWNUT, CAULIFLOWER, CELERY     Note: Lab results reviewed.  PFSH  Drug: Ms. Canniff  reports no history of drug use. Alcohol:  reports previous alcohol use. Tobacco:  reports that she quit smoking about 10 months ago. Her smoking use included cigarettes. She has never used smokeless tobacco. Medical:  has a past medical history of Allergy, Anemia, Anxiety, Arthritis, Back pain, Coronary artery disease, Depression, Hypertension, Peripheral vascular disease ([place]), Stroke ([place]), and Thyroid disease. Family: family history includes Bipolar disorder in her daughter; Breast cancer in her mother; Heart attack in her father.  Past Surgical History:  Procedure Laterality Date   back injections     CARDIAC CATHETERIZATION     CORONARY ANGIOPLASTY     CORONARY ARTERY BYPASS GRAFT     Active Ambulatory Problems    Diagnosis Date Noted   Acute encephalopathy 10/06/2020   CVA (cerebral vascular accident) ([place]) 10/06/2020   Atrial flutter ([place]) 10/06/2020   Depression, unspecified 10/02/2020   Peripheral vascular disease ([place]) 10/06/2020   Acquired hypothyroidism 10/02/2020   Athscl heart disease of native coronary artery w/o ang pctrs 10/02/2020   Vitamin B deficiency, unspecified 10/02/2020   Other chronic pain 10/02/2020   Falls frequently 11/18/2020    Paroxysmal atrial fibrillation ([place]) 11/18/2020   Hyperlipidemia, unspecified 10/02/2020   Essential hypertension 11/26/2020   Dry skin dermatitis 02/03/2021   At risk for falls 02/03/2021   Athlete's foot on right 02/03/2021   Personal history of nicotine dependence 10/02/2020   Prsnl hx of TIA (TIA), and cereb infrc w/o resid deficits 10/02/2020   Anemia, unspecified 10/02/2020   Chronic low back pain (1ry area of Pain) (Bilateral) (L>R) w/o sciatica 02/17/2021   Difficulty sleeping 02/17/2021   Dorsalgia, unspecified 10/02/2020   History of stroke 10/02/2020   Chronic anticoagulation (  Eliquis) 10/02/2020   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   Presence of coronary angioplasty implant and graft 10/02/2020   Repeated falls 11/18/2020   Chronic pain syndrome 03/14/2021   Pharmacologic therapy 03/14/2021   Disorder of skeletal system 03/14/2021   Problems influencing health status 03/14/2021   Cervicalgia 03/14/2021   Chronic neck pain (2ry area of Pain) (Bilateral) (L>R) 03/14/2021   Chronic lower extremity pain (3ry area of Pain) (Bilateral) (L>R) 03/14/2021   Chronic thigh pain (Bilateral) 03/14/2021   Chronic hand pain (Left) 03/14/2021   Chronic hand pain (Right) 03/14/2021   Resolved Ambulatory Problems    Diagnosis Date Noted   No Resolved Ambulatory Problems   Past Medical History:  Diagnosis Date   Allergy    Anemia    Anxiety    Arthritis    Back pain    Coronary artery disease    Depression    Hypertension    Stroke [facility])    Thyroid disease    Constitutional Exam  General appearance: Well nourished, well developed, and well hydrated. In no apparent acute distress Vitals:   03/14/21 1011  BP: 117/72  Pulse: 89  Resp: 18  Temp: (!) 96.9 F (36.1 C)  TempSrc: Temporal  SpO2: 99%  Weight: 181 lb (82.1 kg)  Height: _0  (1.549 m)   BMI Assessment: Estimated body mass index is 34.2 kg/m as calculated from the following:   Height as of  this encounter: _1  (1.549 m).   Weight as of this encounter: 181 lb (82.1 kg).  BMI interpretation table: BMI level Category Range association with higher incidence of chronic pain  <18 kg/m2 Underweight   18.5-24.9 kg/m2 Ideal body weight   25-29.9 kg/m2 Overweight Increased incidence by 20%  30-34.9 kg/m2 Obese (Class I) Increased incidence by 68%  35-39.9 kg/m2 Severe obesity (Class II) Increased incidence by 136%  >40 kg/m2 Extreme obesity (Class III) Increased incidence by 254%   Patient's current BMI Ideal Body weight  Body mass index is 34.2 kg/m. Ideal body weight: 47.8 kg (105 lb 6.1 oz) Adjusted ideal body weight: 61.5 kg (135 lb 10 oz)   BMI Readings from Last 4 Encounters:  03/14/21 34.20 kg/m  02/03/21 35.35 kg/m  12/22/20 33.63 kg/m  12/10/20 35.52 kg/m   Wt Readings from Last 4 Encounters:  03/14/21 181 lb (82.1 kg)  02/03/21 187 lb (84.8 kg)  12/22/20 178 lb (80.7 kg)  12/10/20 188 lb (85.3 kg)    Psych/Mental status: Alert, oriented x 3 (person, place, & time)       Eyes: PERLA Respiratory: No evidence of acute respiratory distress  Assessment  Primary Diagnosis & Pertinent Problem List: The primary encounter diagnosis was Chronic pain syndrome. Diagnoses of Pharmacologic therapy, Disorder of skeletal system, Problems influencing health status, Chronic anticoagulation (Eliquis), Vitamin B deficiency, unspecified, Chronic low back pain (1ry area of Pain) (Bilateral) (L>R) w/o sciatica, Cervicalgia, Chronic neck pain (2ry area of Pain) (Bilateral) (L>R), Chronic lower extremity pain (3ry area of Pain) (Bilateral) (L>R), Chronic thigh pain (Bilateral), Chronic hand pain (Left), and Chronic hand pain (Right) were also pertinent to this visit.  Visit Diagnosis (New problems to examiner): 1. Chronic pain syndrome   2. Pharmacologic therapy   3. Disorder of skeletal system   4. Problems influencing health status   5. Chronic anticoagulation (Eliquis)   6.  Vitamin B deficiency, unspecified   7. Chronic low back pain (1ry area of Pain) (Bilateral) (L>R) w/o sciatica  8. Cervicalgia   9. Chronic neck pain (2ry area of Pain) (Bilateral) (L>R)   10. Chronic lower extremity pain (3ry area of Pain) (Bilateral) (L>R)   11. Chronic thigh pain (Bilateral)   12. Chronic hand pain (Left)   13. Chronic hand pain (Right)    Plan of Care (Initial workup plan)  Note: Ms. Dejarnett was reminded that as per protocol, today's visit has been an evaluation only. We have not taken over the patient's controlled substance management.  Problem-specific plan: No problem-specific Assessment & Plan notes found for this encounter. Lab Orders  Compliance Drug Analysis, Ur  Comp. Metabolic Panel (12)  Magnesium  Vitamin B12  Sedimentation rate  25-Hydroxy vitamin D Lcms D2+D3  C-reactive protein  Vitamin B1  Protime-INR  APTT  Platelet count  Platelet function assay   Imaging Orders  DG Cervical Spine With Flex & Extend  DG Thoracic Spine 2 View  DG Lumbar Spine Complete W/Bend  DG Hand Complete Left  DG Hand Complete Right  DG HIP UNILAT W OR W/O PELVIS 2-3 VIEWS RIGHT  DG HIP UNILAT W OR W/O PELVIS 2-3 VIEWS LEFT   Referral Orders  No referral(s) requested today   Procedure Orders    No procedure(s) ordered today   Pharmacotherapy (current): Medications ordered:  No orders of the defined types were placed in this encounter.  Medications administered during this visit: Christianna Kurkowski had no medications administered during this visit.   Pharmacological management options:  Opioid Analgesics: The patient was informed that there is no guarantee that she would be a candidate for opioid analgesics. The decision will be made following CDC guidelines. This decision will be based on the results of diagnostic studies, as well as Ms. Strauser's risk profile.   Membrane stabilizer: To be determined at a later time  Muscle relaxant: To be determined at a later  time  NSAID: To be determined at a later time  Other analgesic(s): To be determined at a later time   Interventional management options: Ms. Brickey was informed that there is no guarantee that she would be a candidate for interventional therapies. The decision will be based on the results of diagnostic studies, as well as Ms. Richert's risk profile.  Procedure(s) under consideration:  See below   Interventional Therapies  Risk  Complexity Considerations:   Estimated body mass index is 34.2 kg/m as calculated from the following:   Height as of this encounter: _0  (1.549 m).   Weight as of this encounter: 181 lb (82.1 kg). ELIQUIS Anticoagulation: (Stop: 3 days  Restart: 6 hours)  Peripheral vascular disease; atrial flutter/fibrillation; history of stroke (CVA); history of TIAs; coronary artery disease; history of CABG Hypothyroidism   Planned  Pending:   Pending further evaluation   Under consideration:   Diagnostic bilateral lumbar facet block  Diagnostic/therapeutic left lumbar ESI    Completed:   None at this time   Therapeutic  Palliative (PRN) options:   None established   Provider-requested follow-up: Return for evaluation day (40 min) 2nd Visit "Plan of Care".  Future Appointments  Date Time Provider [place]  03/24/2021 10:30 AM Rise Mu, PA-C [facility]  03/25/2021 12:00 PM [facility]  05/02/2021  1:00 PM Milinda Pointer, MD [facility] None  05/03/2021  9:00 AM [facility] PHARMACIST [facility]  05/10/2021  9:30 AM [name], Kelby Aline, FNP [facility]    Note by: Gaspar Cola, MD Date: 03/14/2021; Time: 12:28 PM

## 2021-03-14 ENCOUNTER — Ambulatory Visit
Admission: RE | Admit: 2021-03-14 | Discharge: 2021-03-14 | Disposition: A | Discharge status: Home / Self Care | Payer: Medicare Other | Source: Ambulatory Visit | Attending: Pain Medicine | Admitting: Pain Medicine

## 2021-03-14 ENCOUNTER — Ambulatory Visit (HOSPITAL_BASED_OUTPATIENT_CLINIC_OR_DEPARTMENT_OTHER): Payer: Medicare Other | Admitting: Pain Medicine

## 2021-03-14 ENCOUNTER — Other Ambulatory Visit: Payer: Self-pay | Admitting: Adult Health

## 2021-03-14 ENCOUNTER — Other Ambulatory Visit
Admission: RE | Admit: 2021-03-14 | Discharge: 2021-03-14 | Disposition: A | Discharge status: Home / Self Care | Payer: Medicare Other | Source: Home / Self Care | Attending: Pain Medicine | Admitting: Pain Medicine

## 2021-03-14 ENCOUNTER — Ambulatory Visit
Admission: RE | Admit: 2021-03-14 | Discharge: 2021-03-14 | Disposition: A | Discharge status: Home / Self Care | Payer: Medicare Other | Attending: Pain Medicine | Admitting: Pain Medicine

## 2021-03-14 ENCOUNTER — Other Ambulatory Visit: Payer: Self-pay

## 2021-03-14 VITALS — BP 117/72 | HR 89 | Temp 96.9°F | Resp 18 | Ht 61.0 in | Wt 181.0 lb

## 2021-03-14 DIAGNOSIS — M79651 Pain in right thigh: Secondary | ICD-10-CM

## 2021-03-14 DIAGNOSIS — M542 Cervicalgia: Secondary | ICD-10-CM

## 2021-03-14 DIAGNOSIS — I25810 Atherosclerosis of coronary artery bypass graft(s) without angina pectoris: Secondary | ICD-10-CM

## 2021-03-14 DIAGNOSIS — G8929 Other chronic pain: Secondary | ICD-10-CM

## 2021-03-14 DIAGNOSIS — Z79899 Other long term (current) drug therapy: Secondary | ICD-10-CM | POA: Insufficient documentation

## 2021-03-14 DIAGNOSIS — E539 Vitamin B deficiency, unspecified: Secondary | ICD-10-CM | POA: Insufficient documentation

## 2021-03-14 DIAGNOSIS — Z789 Other specified health status: Secondary | ICD-10-CM | POA: Insufficient documentation

## 2021-03-14 DIAGNOSIS — E039 Hypothyroidism, unspecified: Secondary | ICD-10-CM | POA: Diagnosis present

## 2021-03-14 DIAGNOSIS — E785 Hyperlipidemia, unspecified: Secondary | ICD-10-CM | POA: Insufficient documentation

## 2021-03-14 DIAGNOSIS — M79604 Pain in right leg: Secondary | ICD-10-CM | POA: Insufficient documentation

## 2021-03-14 DIAGNOSIS — L853 Xerosis cutis: Secondary | ICD-10-CM | POA: Insufficient documentation

## 2021-03-14 DIAGNOSIS — M545 Low back pain, unspecified: Secondary | ICD-10-CM | POA: Insufficient documentation

## 2021-03-14 DIAGNOSIS — M79652 Pain in left thigh: Secondary | ICD-10-CM | POA: Insufficient documentation

## 2021-03-14 DIAGNOSIS — M79605 Pain in left leg: Secondary | ICD-10-CM | POA: Diagnosis present

## 2021-03-14 DIAGNOSIS — Z7901 Long term (current) use of anticoagulants: Secondary | ICD-10-CM | POA: Insufficient documentation

## 2021-03-14 DIAGNOSIS — M79642 Pain in left hand: Secondary | ICD-10-CM | POA: Insufficient documentation

## 2021-03-14 DIAGNOSIS — E538 Deficiency of other specified B group vitamins: Secondary | ICD-10-CM

## 2021-03-14 DIAGNOSIS — M79641 Pain in right hand: Secondary | ICD-10-CM | POA: Insufficient documentation

## 2021-03-14 DIAGNOSIS — G894 Chronic pain syndrome: Secondary | ICD-10-CM | POA: Insufficient documentation

## 2021-03-14 DIAGNOSIS — M899 Disorder of bone, unspecified: Secondary | ICD-10-CM

## 2021-03-14 LAB — COMPREHENSIVE METABOLIC PANEL
ALT: 19 U/L (ref 0–44)
AST: 23 U/L (ref 15–41)
Albumin: 4.9 g/dL (ref 3.5–5.0)
Alkaline Phosphatase: 60 U/L (ref 38–126)
Anion gap: 9 (ref 5–15)
BUN: 24 mg/dL — ABNORMAL HIGH (ref 8–23)
CO2: 23 mmol/L (ref 22–32)
Calcium: 9.5 mg/dL (ref 8.9–10.3)
Chloride: 103 mmol/L (ref 98–111)
Creatinine, Ser: 0.86 mg/dL (ref 0.44–1.00)
GFR, Estimated: >60 mL/min (ref >60)
Glucose, Bld: 102 mg/dL — ABNORMAL HIGH (ref 70–99)
Potassium: 4.5 mmol/L (ref 3.5–5.1)
Sodium: 135 mmol/L (ref 135–145)
Total Bilirubin: 0.7 mg/dL (ref 0.3–1.2)
Total Protein: 7.9 g/dL (ref 6.5–8.1)

## 2021-03-14 LAB — PROTIME-INR
INR: 1.2 (ref 0.8–1.2)
Prothrombin Time: 15.3 seconds — ABNORMAL HIGH (ref 11.4–15.2)

## 2021-03-14 LAB — PLATELET FUNCTION ASSAY
Collagen / ADP: 69 seconds (ref 0–118)
Collagen / Epinephrine: >300 seconds — ABNORMAL HIGH (ref 0–193)

## 2021-03-14 LAB — VITAMIN B12: Vitamin B-12: 590 pg/mL (ref 180–914)

## 2021-03-14 LAB — APTT: aPTT: 30 seconds (ref 24–36)

## 2021-03-14 LAB — MAGNESIUM: Magnesium: 1.9 mg/dL (ref 1.7–2.4)

## 2021-03-14 LAB — SEDIMENTATION RATE: Sed Rate: 4 mm/hr (ref 0–30)

## 2021-03-14 LAB — C-REACTIVE PROTEIN: CRP: 0.8 mg/dL (ref <1.0)

## 2021-03-14 MED ORDER — CLONAZEPAM 0.5 MG PO TABS: 0.5000 mg | ORAL_TABLET | Freq: Every day | ORAL | 0 refills | Status: DC | PRN

## 2021-03-14 NOTE — Patient Instructions (Signed)
______________________________________________________________________________________________  Specialty Pain Scale  Introduction:  There are significant differences in how pain is reported. The word pain usually refers to physical pain, but it is also a common synonym of suffering. The medical community uses a scale from 0 (zero) to 10 (ten) to report pain level. Zero (0) is described as "no pain", while ten (10) is described as "the worse pain you can imagine". The problem with this scale is that physical pain is reported along with suffering. Suffering refers to mental pain, or more often yet it refers to any unpleasant feeling, emotion or aversion associated with the perception of harm or threat of harm. It is the psychological component of pain.  Pain Specialists prefer to separate the two components. The pain scale used by this practice is the Verbal Numerical Rating Scale (VNRS-11). This scale is for the physical pain only. DO NOT INCLUDE how your pain psychologically affects you. This scale is for adults 21 years of age and older. It has 11 (eleven) levels. The 1st level is 0/10. This means: "right now, I have no pain". In the context of pain management, it also means: "right now, my physical pain is under control with the current therapy".  General Information:  The scale should reflect your current level of pain. Unless you are specifically asked for the level of your worst pain, or your average pain. If you are asked for one of these two, then it should be understood that it is over the past 24 hours.  Levels 1 (one) through 5 (five) are described below, and can be treated as an outpatient. Ambulatory pain management facilities such as ours are more than adequate to treat these levels. Levels 6 (six) through 10 (ten) are also described below, however, these must be treated as a hospitalized patient. While levels 6 (six) and 7 (seven) may be evaluated at an urgent care facility, levels 8  (eight) through 10 (ten) constitute medical emergencies and as such, they belong in a hospital's emergency department. When having these levels (as described below), do not come to our office. Our facility is not equipped to manage these levels. Go directly to an urgent care facility or an emergency department to be evaluated.  Definitions:  Activities of Daily Living (ADL): Activities of daily living (ADL or ADLs) is a term used in healthcare to refer to people's daily self-care activities. Health professionals often use a person's ability or inability to perform ADLs as a measurement of their functional status, particularly in regard to people post injury, with disabilities and the elderly. There are two ADL levels: Basic and Instrumental. Basic Activities of Daily Living (BADL  or BADLs) consist of self-care tasks that include: Bathing and showering; personal hygiene and grooming (including brushing/combing/styling hair); dressing; Toilet hygiene (getting to the toilet, cleaning oneself, and getting back up); eating and self-feeding (not including cooking or chewing and swallowing); functional mobility, often referred to as "transferring", as measured by the ability to walk, get in and out of bed, and get into and out of a chair; the broader definition (moving from one place to another while performing activities) is useful for people with different physical abilities who are still able to get around independently. Basic ADLs include the things many people do when they get up in the morning and get ready to go out of the house: get out of bed, go to the toilet, bathe, dress, groom, and eat. On the average, loss of function typically follows a particular order.   Hygiene is the first to go, followed by loss of toilet use and locomotion. The last to go is the ability to eat. When there is only one remaining area in which the person is independent, there is a 62.9% chance that it is eating and only a 3.5% chance  that it is hygiene. Instrumental Activities of Daily Living (IADL or IADLs) are not necessary for fundamental functioning, but they let an individual live independently in a community. IADL consist of tasks that include: cleaning and maintaining the house; home establishment and maintenance; care of others (including selecting and supervising caregivers); care of pets; child rearing; managing money; managing financials (investments, etc.); meal preparation and cleanup; shopping for groceries and necessities; moving within the community; safety procedures and emergency responses; health management and maintenance (taking prescribed medications); and using the telephone or other form of communication.  Instructions:  Most patients tend to report their pain as a combination of two factors, their physical pain and their psychosocial pain. This last one is also known as "suffering" and it is reflection of how physical pain affects you socially and psychologically. From now on, report them separately.  From this point on, when asked to report your pain level, report only your physical pain. Use the following table for reference.  Pain Clinic Pain Levels (0-5/10)  Pain Level Score  Description  No Pain 0   Mild pain 1 Nagging, annoying, but does not interfere with basic activities of daily living (ADL). Patients are able to eat, bathe, get dressed, toileting (being able to get on and off the toilet and perform personal hygiene functions), transfer (move in and out of bed or a chair without assistance), and maintain continence (able to control bladder and bowel functions). Blood pressure and heart rate are unaffected. A normal heart rate for a healthy adult ranges from 60 to 100 bpm (beats per minute).   Mild to moderate pain 2 Noticeable and distracting. Impossible to hide from other people. More frequent flare-ups. Still possible to adapt and function close to normal. It can be very annoying and may have  occasional stronger flare-ups. With discipline, patients may get used to it and adapt.   Moderate pain 3 Interferes significantly with activities of daily living (ADL). It becomes difficult to feed, bathe, get dressed, get on and off the toilet or to perform personal hygiene functions. Difficult to get in and out of bed or a chair without assistance. Very distracting. With effort, it can be ignored when deeply involved in activities.   Moderately severe pain 4 Impossible to ignore for more than a few minutes. With effort, patients may still be able to manage work or participate in some social activities. Very difficult to concentrate. Signs of autonomic nervous system discharge are evident: dilated pupils (mydriasis); mild sweating (diaphoresis); sleep interference. Heart rate becomes elevated (>115 bpm). Diastolic blood pressure (lower number) rises above 100 mmHg. Patients find relief in laying down and not moving.   Severe pain 5 Intense and extremely unpleasant. Associated with frowning face and frequent crying. Pain overwhelms the senses.  Ability to do any activity or maintain social relationships becomes significantly limited. Conversation becomes difficult. Pacing back and forth is common, as getting into a comfortable position is nearly impossible. Pain wakes you up from deep sleep. Physical signs will be obvious: pupillary dilation; increased sweating; goosebumps; brisk reflexes; cold, clammy hands and feet; nausea, vomiting or dry heaves; loss of appetite; significant sleep disturbance with inability to fall asleep or to   remain asleep. When persistent, significant weight loss is observed due to the complete loss of appetite and sleep deprivation.  Blood pressure and heart rate becomes significantly elevated. Caution: If elevated blood pressure triggers a pounding headache associated with blurred vision, then the patient should immediately seek attention at an urgent or emergency care unit, as  these may be signs of an impending stroke.    Emergency Department Pain Levels (6-10/10)  Emergency Room Pain 6 Severely limiting. Requires emergency care and should not be seen or managed at an outpatient pain management facility. Communication becomes difficult and requires great effort. Assistance to reach the emergency department may be required. Facial flushing and profuse sweating along with potentially dangerous increases in heart rate and blood pressure will be evident.   Distressing pain 7 Self-care is very difficult. Assistance is required to transport, or use restroom. Assistance to reach the emergency department will be required. Tasks requiring coordination, such as bathing and getting dressed become very difficult.   Disabling pain 8 Self-care is no longer possible. At this level, pain is disabling. The individual is unable to do even the most "basic" activities such as walking, eating, bathing, dressing, transferring to a bed, or toileting. Fine motor skills are lost. It is difficult to think clearly.   Incapacitating pain 9 Pain becomes incapacitating. Thought processing is no longer possible. Difficult to remember your own name. Control of movement and coordination are lost.   The worst pain imaginable 10 At this level, most patients pass out from pain. When this level is reached, collapse of the autonomic nervous system occurs, leading to a sudden drop in blood pressure and heart rate. This in turn results in a temporary and dramatic drop in blood flow to the brain, leading to a loss of consciousness. Fainting is one of the body's self defense mechanisms. Passing out puts the brain in a calmed state and causes it to shut down for a while, in order to begin the healing process.    Summary: 1.   Refer to this scale when providing us with your pain level. 2.   Be accurate and careful when reporting your pain level. This will help with your care. 3.   Over-reporting your pain level  will lead to loss of credibility. 4.   Even a level of 1/10 means that there is pain and will be treated at our facility. 5.   High, inaccurate reporting will be documented as "Symptom Exaggeration", leading to loss of credibility and suspicions of possible secondary gains such as obtaining more narcotics, or wanting to appear disabled, for fraudulent reasons. 6.   Only pain levels of 5 or below will be seen at our facility. 7.   Pain levels of 6 and above will be sent to the Emergency Department and the appointment cancelled.  ______________________________________________________________________________________________   ____________________________________________________________________________________________  General Risks and Possible Complications  Patient Responsibilities: It is important that you read this as it is part of your informed consent. It is our duty to inform you of the risks and possible complications associated with treatments offered to you. It is your responsibility as a patient to read this and to ask questions about anything that is not clear or that you believe was not covered in this document.  Patient's Rights: You have the right to refuse treatment. You also have the right to change your mind, even after initially having agreed to have the treatment done. However, under this last option, if you wait until the   last second to change your mind, you may be charged for the materials used up to that point.  Introduction: Medicine is not an exact science. Everything in Medicine, including the lack of treatment(s), carries the potential for danger, harm, or loss (which is by definition: Risk). In Medicine, a complication is a secondary problem, condition, or disease that can aggravate an already existing one. All treatments carry the risk of possible complications. The fact that a side effects or complications occurs, does not imply that the treatment was conducted incorrectly.  It must be clearly understood that these can happen even when everything is done following the highest safety standards.  No treatment: You can choose not to proceed with the proposed treatment alternative. The "PRO(s)" would include: avoiding the risk of complications associated with the therapy. The "CON(s)" would include: not getting any of the treatment benefits. These benefits fall under one of three categories: diagnostic; therapeutic; and/or palliative. Diagnostic benefits include: getting information which can ultimately lead to improvement of the disease or symptom(s). Therapeutic benefits are those associated with the successful treatment of the disease. Finally, palliative benefits are those related to the decrease of the primary symptoms, without necessarily curing the condition (example: decreasing the pain from a flare-up of a chronic condition, such as incurable terminal cancer).  General Risks and Complications: These are associated to most interventional treatments. They can occur alone, or in combination. They fall under one of the following six (6) categories: no benefit or worsening of symptoms; bleeding; infection; nerve damage; allergic reactions; and/or death. No benefits or worsening of symptoms: In Medicine there are no guarantees, only probabilities. No healthcare provider can ever guarantee that a medical treatment will work, they can only state the probability that it may. Furthermore, there is always the possibility that the condition may worsen, either directly, or indirectly, as a consequence of the treatment. Bleeding: This is more common if the patient is taking a blood thinner, either prescription or over the counter (example: Goody Powders, Fish oil, Aspirin, Garlic, etc.), or if suffering a condition associated with impaired coagulation (example: Hemophilia, cirrhosis of the liver, low platelet counts, etc.). However, even if you do not have one on these, it can still  happen. If you have any of these conditions, or take one of these drugs, make sure to notify your treating physician. Infection: This is more common in patients with a compromised immune system, either due to disease (example: diabetes, cancer, human immunodeficiency virus [HIV], etc.), or due to medications or treatments (example: therapies used to treat cancer and rheumatological diseases). However, even if you do not have one on these, it can still happen. If you have any of these conditions, or take one of these drugs, make sure to notify your treating physician. Nerve Damage: This is more common when the treatment is an invasive one, but it can also happen with the use of medications, such as those used in the treatment of cancer. The damage can occur to small secondary nerves, or to large primary ones, such as those in the spinal cord and brain. This damage may be temporary or permanent and it may lead to impairments that can range from temporary numbness to permanent paralysis and/or brain death. Allergic Reactions: Any time a substance or material comes in contact with our body, there is the possibility of an allergic reaction. These can range from a mild skin rash (contact dermatitis) to a severe systemic reaction (anaphylactic reaction), which can result in death. Death:   In general, any medical intervention can result in death, most of the time due to an unforeseen complication. ____________________________________________________________________________________________  

## 2021-03-14 NOTE — Progress Notes (Signed)
Safety precautions to be maintained throughout the outpatient stay will include: orient to surroundings, keep bed in low position, maintain call bell within reach at all times, provide assistance with transfer out of bed and ambulation.  

## 2021-03-17 NOTE — Progress Notes (Signed)
Cardiology Office Note    Date:  03/24/2021   ID:  Carmen, [name] 24-Dec-1940, MRN [id number]  PCP:  Berniece Pap, FNP  Cardiologist:  Yvonne Kendall, MD  Electrophysiologist:  None   Chief Complaint: Follow-up  History of Present Illness:   Carmen [name] is a 80 y.o. female with history of CAD s/p CABG and PCI, persistent Afib, CVA, PVD, HTN, HLD, hypothyroidism, anemia, chronic pain syndrome, depression, and anxiety who presents for follow up of CAD and A. fib.   She recently moved to [place] from [place] to be closer to family. She was evaluated as a new patient by Dr. Okey Dupre on 11/24/2020 at the request of her PCP. She previously underwent 4-vessel CABG in 2005 with LIMA to LAD, sequential SVG to diagonal and OM, and SVG to PDA. A year later, there was failure of multiple grafts and she underwent PCI/DES (Taxus stents) to the proximal LAD, mid LCx, and RCA x 2 in 2006. These details were provided by cards related to her cardiac interventions by her daughter. Prior to establishing with Dr. Okey Dupre, it was noted she had not been followed by a cardiologist for many years, but was seeing her PCP in [place], [place] regularly.   It was noted she had been on Eliquis in the setting of Afib/flutter and was prescribed 5 mg bid, though her daughter, a pharmacist in [place], decreased the patient's dosage to 2.5 mg, as she felt this dose was more appropriate for her mother.   Prior to moving to [place], it was noted she had been extremely weak and had innumerable falls for at least a year. In this setting, she was hospitalized in 10/2020, due to encephalopathy, falls, and failure to thrive. Imaging showed evidence of prior infarcts. She was subsequently seen by her PCP in the office in 11/2020, and was noted to be in Afib. She was advised to go to the ED for further evaluation in the setting of innumerable falls and increasing dizziness in the setting of Afib. The patient declined.   At her visit to establish care  with our office on 11/24/2020, she noted some exertional dyspnea and chest pain that she attributed to her prior falls that had improved. She complained of intermittent lightheadedness and cramping of the left calf, particularly at night. She reported having been previously been prescribed cilostazol. It was noted she had difficulty staying well hydrated and was regularly given Pedialyte by her daughter. Without this, she reported frequent nausea. She was noted to be in rate-controlled Afib, which had been present for at least a few months, and possibly longer. It was noted, she was still on subtherapeutic dosed Eliquis and this was increased to 5 mg bid.   She underwent echo on 12/14/2020 that showed an EF of 55-60%, no RWMA, moderate LVH, normal RVSF and ventricular size, normal PASP, mildly dilated left atrium, and mild mitral regurgitation. Lower extremity ABIs were normal bilaterally.  Following this, PCP has discontinued gemfibrozil and cilostazol.    She was last seen in the office in 11/2020 and was doing well from a cardiac perspective.  She continued to note some positional dizziness, though this was improved on lower dose of Lotrel.  She was tolerating Eliquis.  In the setting of continued positional dizziness and hypotension, Toprol-XL was decreased to 50 mg.  She comes in today accompanied by one of her daughters and is doing quite well from a cardiac perspective.  Since she was last seen she has noted  an increase in her functional status with a decrease in underlying fatigue.  She feels like her legs are getting stronger.  She has been working with PT.  No chest pain, dyspnea, palpitations, dizziness, falls, presyncope, syncope, hematochezia, or melena.  She is tolerating cardiac medications without issues.  She is pleased with her improvement.  She continues to wish to avoid any procedures.     Labs independently reviewed: 03/2021 - potassium 4.5, BUN 24, serum creatinine 0.86, albumin 4.9,  AST/ALT normal, magnesium 1.9 11/2020 - HGB 11.8, PLT 267, TSH normal 10/2020 - A1c 5.8    Past Medical History:  Diagnosis Date   Allergy    Anemia    Anxiety    Arthritis    Back pain    Coronary artery disease    Depression    Hypertension    Peripheral vascular disease (HCC)    Stroke (HCC)    Thyroid disease     Past Surgical History:  Procedure Laterality Date   back injections     CARDIAC CATHETERIZATION     CORONARY ANGIOPLASTY     CORONARY ARTERY BYPASS GRAFT      Current Medications: Current Meds  Medication Sig   amLODipine-benazepril (LOTREL) 5-10 MG capsule Take 1 capsule by mouth daily.   apixaban (ELIQUIS) 5 MG TABS tablet Take 1 tablet (5 mg total) by mouth 2 (two) times daily.   aspirin EC 81 MG tablet Take 81 mg by mouth daily. Swallow whole.   atorvastatin (LIPITOR) 20 MG tablet Take 1 tablet (20 mg total) by mouth at bedtime.   clonazePAM (KLONOPIN) 0.5 MG tablet Take 1 tablet (0.5 mg total) by mouth daily as needed for anxiety.   HYDROcodone-acetaminophen (NORCO/VICODIN) 5-325 MG tablet Take 1-2 tablets by mouth every 8 (eight) hours as needed for severe pain (take only as needed.).   levothyroxine (SYNTHROID) 100 MCG tablet Take 100 mcg by mouth daily before breakfast.   metoprolol succinate (TOPROL-XL) 100 MG 24 hr tablet Take 50 mg by mouth daily.   traZODone (DESYREL) 50 MG tablet Take 0.5 tablets (25 mg total) by mouth at bedtime as needed for sleep.   vitamin B-12 (CYANOCOBALAMIN) 500 MCG tablet Take 500 mcg by mouth every Monday, Wednesday, and Friday.    Allergies:   Patient has no known allergies.   Social History   Socioeconomic History   Marital status: Widowed    Spouse name: Not on file   Number of children: Not on file   Years of education: Not on file   Highest education level: Not on file  Occupational History   Not on file  Tobacco Use   Smoking status: Former    Pack years: 0.00    Types: Cigarettes    Quit date: 05/2020     Years since quitting: 0.8   Smokeless tobacco: Never   Tobacco comments:    Smoked about 1 pack per month  Vaping Use   Vaping Use: Never used  Substance and Sexual Activity   Alcohol use: Not Currently   Drug use: Never   Sexual activity: Not on file  Other Topics Concern   Not on file  Social History Narrative   ** Merged History Encounter **       Social Determinants of Health   Financial Resource Strain: Low Risk    Difficulty of Paying Living Expenses: Not hard at all  Food Insecurity: No Food Insecurity   Worried About Programme researcher, broadcasting/film/video in the Last  Year: Never true   Ran Out of Food in the Last Year: Never true  Transportation Needs: No Transportation Needs   Lack of Transportation (Medical): No   Lack of Transportation (Non-Medical): No  Physical Activity: Not on file  Stress: Not on file  Social Connections: Not on file     Family History:  The patient's family history includes Bipolar disorder in her daughter; Breast cancer in her mother; Heart attack in her father. There is no history of Diabetes Mellitus II.  ROS:   Review of Systems  Constitutional:  Positive for malaise/fatigue. Negative for chills, diaphoresis, fever and weight loss.       Improved fatigue  HENT:  Negative for congestion.   Eyes:  Negative for discharge and redness.  Respiratory:  Negative for cough, sputum production, shortness of breath and wheezing.   Cardiovascular:  Negative for chest pain, palpitations, orthopnea, claudication, leg swelling and PND.  Gastrointestinal:  Negative for abdominal pain, blood in stool, heartburn, melena, nausea and vomiting.  Musculoskeletal:  Negative for falls and myalgias.  Skin:  Negative for rash.  Neurological:  Negative for dizziness, tingling, tremors, sensory change, speech change, focal weakness, loss of consciousness and weakness.  Endo/Heme/Allergies:  Does not bruise/bleed easily.  Psychiatric/Behavioral:  Negative for substance abuse.  The patient is not nervous/anxious.   All other systems reviewed and are negative.   EKGs/Labs/Other Studies Reviewed:    Studies reviewed were summarized above. The additional studies were reviewed today:  2D echo 12/14/2020: 1. Left ventricular ejection fraction, by estimation, is 55 to 60%. The  left ventricle has normal function. The left ventricle has no regional  wall motion abnormalities. There is moderate left ventricular hypertrophy.  Left ventricular diastolic  parameters are indeterminate.   2. Right ventricular systolic function is normal. The right ventricular  size is normal. There is normal pulmonary artery systolic pressure. The  estimated right ventricular systolic pressure is 22.9 mmHg.   3. Left atrial size was mildly dilated.   4. The mitral valve is normal in structure. Mild mitral valve  Regurgitation. __________   Bilateral lower extremity ABI 12/14/2020: Summary:  Right: Resting right ankle-brachial index is within normal range. No  evidence of significant right lower extremity arterial disease. The right  toe-brachial index is normal.   Left: Resting left ankle-brachial index is within normal range. No  evidence of significant left lower extremity arterial disease. The left  toe-brachial index is normal.   EKG:  EKG is ordered today.  The EKG ordered today demonstrates A. fib, 86 bpm, poor R wave progression along the precordial leads, nonspecific ST-T changes  Recent Labs: 11/22/2020: Hemoglobin 11.8; Platelets 267; TSH 1.270 03/14/2021: ALT 19; BUN 24; Creatinine, Ser 0.86; Magnesium 1.9; Potassium 4.5; Sodium 135  Recent Lipid Panel No results found for: CHOL, TRIG, HDL, CHOLHDL, VLDL, LDLCALC, LDLDIRECT  PHYSICAL EXAM:    VS:  BP 128/78 (BP Location: Left Arm, Patient Position: Sitting, Cuff Size: Normal)   Pulse 86   Ht 5' (1.524 m)   Wt 181 lb 8 oz (82.3 kg)   SpO2 98%   BMI 35.45 kg/m   BMI: Body mass index is 35.45 kg/m.  Physical  Exam Vitals reviewed.  Constitutional:      Appearance: She is well-developed.  HENT:     Head: Normocephalic and atraumatic.  Eyes:     General:        Right eye: No discharge.        Left  eye: No discharge.  Neck:     Vascular: No JVD.  Cardiovascular:     Rate and Rhythm: Normal rate. Rhythm irregularly irregular.     Pulses:          Posterior tibial pulses are 2+ on the right side and 2+ on the left side.     Heart sounds: Normal heart sounds, S1 normal and S2 normal. Heart sounds not distant. No midsystolic click and no opening snap. No murmur heard.   No friction rub.  Pulmonary:     Effort: Pulmonary effort is normal. No respiratory distress.     Breath sounds: Normal breath sounds. No decreased breath sounds, wheezing or rales.  Chest:     Chest wall: No tenderness.  Abdominal:     General: There is no distension.     Palpations: Abdomen is soft.     Tenderness: There is no abdominal tenderness.  Musculoskeletal:     Cervical back: Normal range of motion.  Skin:    General: Skin is warm and dry.     Nails: There is no clubbing.  Neurological:     Mental Status: She is alert and oriented to person, place, and time.  Psychiatric:        Speech: Speech normal.        Behavior: Behavior normal.        Thought Content: Thought content normal.        Judgment: Judgment normal.    Wt Readings from Last 3 Encounters:  03/24/21 181 lb 8 oz (82.3 kg)  03/14/21 181 lb (82.1 kg)  02/03/21 187 lb (84.8 kg)     ASSESSMENT & PLAN:   CAD status post CABG with subsequent PCI without angina: She is doing well without any symptoms concerning for angina.  She remains on apixaban and aspirin as directed by interventional cardiology given CABG with subsequent PCI without symptoms concerning for bleeding or falls.  She has tolerated the resumption of atorvastatin as outlined below.  No plans for ischemic evaluation at this time.  Persistent A. fib: She remains in A. fib with  controlled ventricular response and has been in this rhythm for at least the past several months, potentially longer.  She is asymptomatic with this rhythm.  Given the patient's asymptomatic nature, and in the setting of the patient not wanting to undergo any procedures we have agreed to pursue rate control strategy only.  Continue Toprol-XL 50 mg daily for rate control.  Given her CHA2DS2-VASc of at least 6, and in the context of no recent falls or symptoms concerning for bleeding she remains on apixaban 5 mg twice daily.  She does not currently meet reduced dosing criteria.  Recent CBC demonstrating stable hemoglobin.  HTN: Blood pressure is well controlled.  Continue current medical therapy including Lotrel and metoprolol.  HLD: LDL 60.  Tolerating atorvastatin.  Disposition: F/u with Dr. Okey DupreEnd or an APP in 6 months, sooner if needed.   Medication Adjustments/Labs and Tests Ordered: Current medicines are reviewed at length with the patient today.  Concerns regarding medicines are outlined above. Medication changes, Labs and Tests ordered today are summarized above and listed in the Patient Instructions accessible in Encounters.   Signed, Eula Listenyan Renold Kozar, PA-C 03/24/2021 11:32 AM     [facility] [place] [address] [place], [place] [address] [phone]

## 2021-03-18 LAB — 25-HYDROXY VITAMIN D LCMS D2+D3
25-Hydroxy, Vitamin D-2: <1 ng/mL
25-Hydroxy, Vitamin D-3: 12 ng/mL
25-Hydroxy, Vitamin D: 12 ng/mL — ABNORMAL LOW

## 2021-03-20 LAB — VITAMIN B1: Vitamin B1 (Thiamine): 97.3 nmol/L (ref 66.5–200.0)

## 2021-03-21 LAB — COMPLIANCE DRUG ANALYSIS, UR

## 2021-03-24 ENCOUNTER — Encounter: Payer: Self-pay | Admitting: Physician Assistant

## 2021-03-24 ENCOUNTER — Other Ambulatory Visit: Payer: Self-pay

## 2021-03-24 ENCOUNTER — Ambulatory Visit (INDEPENDENT_AMBULATORY_CARE_PROVIDER_SITE_OTHER): Payer: Medicare Other | Admitting: Physician Assistant

## 2021-03-24 VITALS — BP 128/78 | HR 86 | Ht 60.0 in | Wt 181.5 lb

## 2021-03-24 DIAGNOSIS — I25810 Atherosclerosis of coronary artery bypass graft(s) without angina pectoris: Secondary | ICD-10-CM

## 2021-03-24 DIAGNOSIS — I4819 Other persistent atrial fibrillation: Secondary | ICD-10-CM

## 2021-03-24 DIAGNOSIS — I10 Essential (primary) hypertension: Secondary | ICD-10-CM | POA: Diagnosis not present

## 2021-03-24 DIAGNOSIS — E785 Hyperlipidemia, unspecified: Secondary | ICD-10-CM

## 2021-03-24 NOTE — Patient Instructions (Signed)
Medication Instructions:  No changes today.  *If you need a refill on your cardiac medications before your next appointment, please call your pharmacy*   Lab Work: None  If you have labs (blood work) drawn today and your tests are completely normal, you will receive your results only by: MyChart Message (if you have MyChart) OR A paper copy in the mail If you have any lab test that is abnormal or we need to change your treatment, we will call you to review the results.   Testing/Procedures: None    Follow-Up: At [facility], you and your health needs are our priority.  As part of our continuing mission to provide you with exceptional heart care, we have created designated Provider Care Teams.  These Care Teams include your primary Cardiologist (physician) and Advanced Practice Providers (APPs -  Physician Assistants and Nurse Practitioners) who all work together to provide you with the care you need, when you need it.   Your next appointment:   6 month(s)  The format for your next appointment:   In Person  Provider:   Yvonne Kendall, MD or Eula Listen, PA-C

## 2021-03-25 ENCOUNTER — Ambulatory Visit: Payer: Medicare Other | Admitting: *Deleted

## 2021-03-25 DIAGNOSIS — F32A Depression, unspecified: Secondary | ICD-10-CM

## 2021-03-25 DIAGNOSIS — G894 Chronic pain syndrome: Secondary | ICD-10-CM

## 2021-03-25 DIAGNOSIS — G8929 Other chronic pain: Secondary | ICD-10-CM

## 2021-03-25 DIAGNOSIS — R296 Repeated falls: Secondary | ICD-10-CM

## 2021-03-25 DIAGNOSIS — M545 Low back pain, unspecified: Secondary | ICD-10-CM

## 2021-03-25 DIAGNOSIS — Z9181 History of falling: Secondary | ICD-10-CM

## 2021-03-25 DIAGNOSIS — Z8673 Personal history of transient ischemic attack (TIA), and cerebral infarction without residual deficits: Secondary | ICD-10-CM

## 2021-03-25 DIAGNOSIS — F419 Anxiety disorder, unspecified: Secondary | ICD-10-CM

## 2021-03-25 NOTE — Chronic Care Management (AMB) (Signed)
Chronic Care Management   CCM RN Visit Note  03/25/2021 Name: Carmen [name] MRN: [id number] DOB: Sep 07, 1941  Subjective: Carmen [name] is a 80 y.o. year old female who is a primary care patient of [name], Carmen Fried, FNP. The care management team was consulted for assistance with disease management and care coordination needs.    Engaged with patient by telephone for follow up visit in response to provider referral for case management and/or care coordination services.   Consent to Services:  The patient was given information about Chronic Care Management services, agreed to services, and gave verbal consent prior to initiation of services.  Please see initial visit note for detailed documentation.   Patient agreed to services and verbal consent obtained.   Assessment: Review of patient past medical history, allergies, medications, health status, including review of consultants reports, laboratory and other test data, was performed as part of comprehensive evaluation and provision of chronic care management services.   SDOH (Social Determinants of Health) assessments and interventions performed:    CCM Care Plan  No Known Allergies  Outpatient Encounter Medications as of 03/25/2021  Medication Sig   amLODipine-benazepril (LOTREL) 5-10 MG capsule Take 1 capsule by mouth daily.   apixaban (ELIQUIS) 5 MG TABS tablet Take 1 tablet (5 mg total) by mouth 2 (two) times daily.   aspirin EC 81 MG tablet Take 81 mg by mouth daily. Swallow whole.   atorvastatin (LIPITOR) 20 MG tablet Take 1 tablet (20 mg total) by mouth at bedtime.   clonazePAM (KLONOPIN) 0.5 MG tablet Take 1 tablet (0.5 mg total) by mouth daily as needed for anxiety.   HYDROcodone-acetaminophen (NORCO/VICODIN) 5-325 MG tablet Take 1-2 tablets by mouth every 8 (eight) hours as needed for severe pain (take only as needed.).   levothyroxine (SYNTHROID) 100 MCG tablet Take 100 mcg by mouth daily before breakfast.   metoprolol  succinate (TOPROL-XL) 100 MG 24 hr tablet Take 50 mg by mouth daily.   traZODone (DESYREL) 50 MG tablet Take 0.5 tablets (25 mg total) by mouth at bedtime as needed for sleep.   vitamin B-12 (CYANOCOBALAMIN) 500 MCG tablet Take 500 mcg by mouth every Monday, Wednesday, and Friday.   No facility-administered encounter medications on file as of 03/25/2021.    Patient Active Problem List   Diagnosis Date Noted   Chronic pain syndrome 03/14/2021   Pharmacologic therapy 03/14/2021   Disorder of skeletal system 03/14/2021   Problems influencing health status 03/14/2021   Cervicalgia 03/14/2021   Chronic neck pain (2ry area of Pain) (Bilateral) (L>R) 03/14/2021   Chronic lower extremity pain (3ry area of Pain) (Bilateral) (L>R) 03/14/2021   Chronic thigh pain (Bilateral) 03/14/2021   Chronic hand pain (Left) 03/14/2021   Chronic hand pain (Right) 03/14/2021   Chronic low back pain (1ry area of Pain) (Bilateral) (L>R) w/o sciatica 02/17/2021   Difficulty sleeping 02/17/2021   Dry skin dermatitis 02/03/2021   At risk for falls 02/03/2021   Athlete's foot on right 02/03/2021   Essential hypertension 11/26/2020   Falls frequently 11/18/2020   Paroxysmal atrial fibrillation (HCC) 11/18/2020   Repeated falls 11/18/2020   Acute encephalopathy 10/06/2020   CVA (cerebral vascular accident) (HCC) 10/06/2020   Atrial flutter (HCC) 10/06/2020   Peripheral vascular disease (HCC) 10/06/2020   Depression, unspecified 10/02/2020   Acquired hypothyroidism 10/02/2020   Athscl heart disease of native coronary artery w/o ang pctrs 10/02/2020   Vitamin B deficiency, unspecified 10/02/2020   Other chronic pain 10/02/2020   Hyperlipidemia,  unspecified 10/02/2020   Personal history of nicotine dependence 10/02/2020   Prsnl hx of TIA (TIA), and cereb infrc w/o resid deficits 10/02/2020   Anemia, unspecified 10/02/2020   Dorsalgia, unspecified 10/02/2020   History of stroke 10/02/2020   Chronic  anticoagulation (Eliquis) 10/02/2020   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  Denies actual injuries with falls but lots of bruising.  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  [facility] in [facility]) and receiving home health physical therapy.  Per daughter, patient is adjusting well and becoming more comfortable at facility.  Using Rolator walker to ambulate.  Daughter denies any falls in the last month.  Attempted to contact patient, she did not answer. Decreased adherence to prescribed treatment for fall prevention Clinical Goal(s):  patient will demonstrate improved adherence to prescribed treatment plan for decreasing falls as evidenced by patient reporting and review of EMR patient will verbalize using fall risk reduction strategies discussed patient will not experience additional falls patient will verbalize understanding of plan for fall reduction Interventions:  Collaboration with [name], Carmen Fried, FNP regarding development and update of comprehensive plan of care as evidenced by provider attestation and co-signature Inter-disciplinary care team collaboration (see longitudinal plan of care) Provided written and verbal education re: Potential causes of falls and Fall prevention strategies Reviewed medications  and discussed potential side effects of medications such as dizziness and frequent urination Assessed for falls since last encounter. Assessed patients daughter's knowledge of fall risk prevention secondary to previously provided education. Assessed working status of patient call light system and patient adherence Encouraged continued participation in home health physical therapy Evaluation of current treatment plan related to falls and patient's adherence to plan as established by provider. Activities of daily living skills assessed Assistive or adaptive device use encouraged; encouraged to use Rolator walker at all times Barriers to physical activity or exercise identified and addressed   Barriers to safety identified Cognitive-stimulating activities promoted; encouraged to participate in group activities at facility Fall prevention plan reviewed and updated Fear of falling, loss of independence and pain acknowledged Medication list reviewed and compliance encouraged Patient Goals/Self-Care Deficits:  Always wear low-heeled or flat shoes or slippers with nonskid soles Keep my cell phone and call light with me always Learn how to get back up if I fall Make an emergency alert plan in case I fall Pick up clutter from the floors and use a nonslip pad with throw rugs, or remove them completely Use walker  with all ambulation Use a nightlight in the bathroom Continue to work with physical therapy  Follow Up Plan: The care management team will reach out to the patient again over the next 45 business days.      Care Plan : Chronic Pain (Adult)  Updates made by Maple Mirza, RN since  03/25/2021 12:00 AM     Problem: Chronic Pain Management (Chronic Pain)   Priority: Medium     Long-Range Goal: Chronic Pain Managed   Start Date: 02/25/2021  Expected End Date: 08/31/2021  This Visit's Progress: On track  Priority: Medium  Note:   Current Barriers:  Knowledge Deficits related to  self-health management of chronic pain; daughter reports patient with chronic back pain and hand pain.  Reports taking pain medication and anxiety medication as prescribed.  Daughter reports patient doing better; feels pain and anxiety/mood is controlled and she feels patient is beginning to enjoy herself at facility.  Has attended consultation appointment with pain management provider and is awaiting follow up appointment.  Daughter reporting patient is engaging more at facility with her peers.  Feels pain is managed based on how much therapy patient does for the day, but reports patient will take Tylenol if more pain medication is needed. Chronic Disease Management support and education needs related to chronic pain Lacks social connections Clinical Goal(s):  patient will verbalize understanding of plan for pain management. , patient will use pharmacological and nonpharmacological pain relief strategies as prescribed. , and patient will verbalize acceptable level of pain relief and ability to engage in desired activities Interventions:  Collaboration with [name], Carmen Fried, FNP regarding development and update of comprehensive plan of care as evidenced by provider attestation and co-signature Pain assessment performed Medications reviewed Discussed plans with patient for ongoing care management follow up and provided patient with direct contact information for care management team Evaluation of current treatment plan related to pain and depression and patient's adherence to plan as established by provider. Social Work referral for mood and depression Discussed plans with patient for ongoing care management follow up and provided patient with direct contact information for care management team Careful application of heat or ice encouraged Deep breathing, relaxation and mindfulness use promoted Effectiveness of pharmacologic therapy monitored Motivation and barriers to change assessed and  addressed Participation in physical therapy encouraged and premedication prior to activity encouraged Attempted to determine if pain is associated with mobility or at rest, location, intensity, frequency, duration, recurrence, pattern and description (e.g., cramping, burning, aching), triggers and relieving factors Emotional support and empathy provided to daughter Encouraged increase in engagement in activities at [facility] facility and also eating meals in dining room to help engagement with other peers Patient Goals/Self Care Activities:  Learn relaxation techniques and use during pain Spend time with positive people; eat more in dining room Tell myself I can (not I can't) Think of new ways to do favorite things Use distraction techniques Continue to attend appointments with pain management provider Engage in more activities at Independent Facility Follow Up Plan: The care management team will reach out to the patient again over the next 45 business days.      Plan:The care management team will reach out to the patient again over the next 45 business days.  Rhae Lerner RN, MSN RN Care Management Coordinator [facility] [phone] [email or web address]

## 2021-03-25 NOTE — Patient Instructions (Signed)
Visit Information  PATIENT GOALS:  Goals Addressed             This Visit's Progress    (RNCM) Cope with Chronic Pain   On track    Timeframe:  Long-Range Goal Priority:  Medium Start Date:  02/25/21                           Expected End Date:  08/31/21                     Follow Up Date 05/04/21    Learn relaxation techniques and use during pain Spend time with positive people; eat more in dining room Tell myself I can (not I can't) Think of new ways to do favorite things Use distraction techniques Continue to attend appointments with pain management provider Engage in more activities at [facility] Facility     Why is this important?   Stress makes chronic pain feel worse.  Feelings like depression, anxiety, stress and anger can make your body more sensitive to pain.  Learning ways to cope with stress or depression may help you find some relief from the pain.     Notes:       (RNCM) Prevent Falls and Injury   On track    Timeframe:  Long-Range Goal Priority:  Medium Start Date:  02/25/21                           Expected End Date:  08/31/21                     Follow Up Date 05/04/21    Always wear low-heeled or flat shoes or slippers with nonskid soles Keep my cell phone and call light with me always Learn how to get back up if I fall Make an emergency alert plan in case I fall Pick up clutter from the floors and use a nonslip pad with throw rugs, or remove them completely Use walker  with all ambulation Use a nightlight in the bathroom Continue to work with physical therapy    Why is this important?   Most falls happen when it is hard for you to walk safely. Your balance may be off because of an illness. You may have pain in your knees, hip or other joints.  You may be overly tired or taking medicines that make you sleepy. You may not be able to see or hear clearly.  Falls can lead to broken bones, bruises or other injuries.  There are things you can do to help  prevent falling.     Notes:          Patient verbalizes understanding of instructions provided today and agrees to view in MyChart.   The care management team will reach out to the patient again over the next 45 business days.   Rhae Lerner RN, MSN RN Care Management Coordinator [facility] [phone] [email or web address]

## 2021-03-29 ENCOUNTER — Telehealth: Payer: Self-pay | Admitting: Adult Health

## 2021-03-29 NOTE — Telephone Encounter (Signed)
"  Rejection Reason - Patient did not respond" Carmen [name] said on Mar 28, 2021 2:29 PM  Msg from [place] derm

## 2021-04-06 NOTE — Telephone Encounter (Signed)
Copied from CRM [id number]. Topic: Quick Communication - Rx Refill/Question >> Apr 06, 2021 10:28 AM Fanny Bien wrote: Medication: HYDROcodone-acetaminophen (NORCO/VICODIN) 5-325 MG tablet [[id number]]   Has the patient contacted their pharmacy?  (Agent: If no, request that the patient contact the pharmacy for the refill.) (Agent: If yes, when and what did the pharmacy advise?)  Preferred Pharmacy (with phone number or street name): [facility] [phone], [place] - [address] [place] [address] Phone: [phone] Fax: [phone] Hours: Not open 24 hours    Agent: Please be advised that RX refills may take up to 3 business days. We ask that you follow-up with your pharmacy.

## 2021-04-07 ENCOUNTER — Other Ambulatory Visit: Payer: Self-pay | Admitting: Internal Medicine

## 2021-04-07 ENCOUNTER — Telehealth: Payer: Self-pay

## 2021-04-07 MED ORDER — HYDROCODONE-ACETAMINOPHEN 5-325 MG PO TABS: 1.0000 | ORAL_TABLET | Freq: Three times a day (TID) | ORAL | 0 refills | Status: DC | PRN

## 2021-04-07 MED ORDER — CLONAZEPAM 0.5 MG PO TABS: 0.5000 mg | ORAL_TABLET | Freq: Every day | ORAL | 0 refills | Status: DC | PRN

## 2021-04-07 NOTE — Telephone Encounter (Signed)
Copied from CRM [id number]. Topic: General - Other >> Apr 07, 2021 10:04 AM Glean Salen wrote: Reason for CRM: Patients daughter called in for HYDROcodone-acetaminophen (NORCO/VICODIN) 5-325 MG tablet  requesting short supply until refill comes in. Please cal back

## 2021-04-21 ENCOUNTER — Other Ambulatory Visit: Payer: Self-pay | Admitting: Family

## 2021-04-21 ENCOUNTER — Telehealth: Payer: Self-pay | Admitting: Adult Health

## 2021-04-21 DIAGNOSIS — G8929 Other chronic pain: Secondary | ICD-10-CM

## 2021-04-21 MED ORDER — HYDROCODONE-ACETAMINOPHEN 5-325 MG PO TABS: 1.0000 | ORAL_TABLET | Freq: Three times a day (TID) | ORAL | 0 refills | Status: DC | PRN

## 2021-04-21 NOTE — Telephone Encounter (Signed)
PT needs a refill of their HYDROcodone-acetaminophen (NORCO/VICODIN) 5-325 MG tablet called in as soon as possible as she will be out tomorrow.

## 2021-04-21 NOTE — Telephone Encounter (Signed)
Pt has been notified that the hydrocodone has been sent to the pharmacy. Estie verbalized understanding and had no further questions.

## 2021-04-29 ENCOUNTER — Telehealth: Payer: Self-pay | Admitting: Adult Health

## 2021-04-29 NOTE — Telephone Encounter (Signed)
Called to speak with [name] and connected with her Daughter. She states that [facility] requested a refill of the Triamcinolone antifungal medication. Iyannah had stated that her rash along her hands has come back about 3 days ago. I informed [name] that [place] will need to be seen and have the medication reordered. [name] states that Harlene can not due currently having a sinus infection. She did not test positive for covid. [name] states that the assisted living facility is under lockdown as many other patients have been diagnosed with covid. [name] states that she is at work and is also feeling under the weather and did not want to go and potentially make her more sick. [name] was informed of the mychart virtual urgent care visits and states that Czarina does not know how to use her phone to do this so she will wait until the hold is lifted and then go visit antonette to make a virtual urgent care appointment.

## 2021-04-29 NOTE — Telephone Encounter (Signed)
Patient is requesting a refill on a cream she was prescribed for a fungus a couple of months ago,she is unsure of the name of the medicine.

## 2021-04-30 DIAGNOSIS — M8580 Other specified disorders of bone density and structure, unspecified site: Secondary | ICD-10-CM | POA: Insufficient documentation

## 2021-04-30 DIAGNOSIS — E559 Vitamin D deficiency, unspecified: Secondary | ICD-10-CM | POA: Insufficient documentation

## 2021-04-30 DIAGNOSIS — S32000S Wedge compression fracture of unspecified lumbar vertebra, sequela: Secondary | ICD-10-CM | POA: Insufficient documentation

## 2021-04-30 NOTE — Progress Notes (Signed)
PROVIDER NOTE: Information contained herein reflects review and annotations entered in association with encounter. Interpretation of such information and data should be left to medically-trained personnel. Information provided to patient can be located elsewhere in the medical record under "Patient Instructions". Document created using STT-dictation technology, any transcriptional errors that may result from process are unintentional.    Patient: Carmen [name]  Service Category: E/M  Provider: Gaspar Cola, MD  DOB: 1941/05/28  DOS: 05/02/2021  Specialty: Interventional Pain Management  MRN: [id number]  Setting: Ambulatory outpatient  PCP: Doreen Beam, FNP  Type: Established Patient    Referring Provider: Sharmon Leyden*  Location: Office  Delivery: Face-to-face     Primary Reason(s) for Visit: Encounter for evaluation before starting new chronic pain management plan of care (Level of risk: moderate) CC: Back Pain (lower) and Neck Pain (States pain radiates to shoulder and down arms bilateral)  HPI  Carmen [name] is a 80 y.o. year old, female patient, who comes today for a follow-up evaluation to review the test results and decide on a treatment plan. She has Acute encephalopathy; CVA (cerebral vascular accident) ([place]); Atrial flutter ([place]); Depression, unspecified; Peripheral vascular disease ([place]); Acquired hypothyroidism; Athscl heart disease of native coronary artery w/o ang pctrs; Vitamin B deficiency, unspecified; Other chronic pain; Falls frequently; Paroxysmal atrial fibrillation ([place]); Hyperlipidemia, unspecified; Essential hypertension; Dry skin dermatitis; At risk for falls; Athlete's foot on right; Personal history of nicotine dependence; Prsnl hx of TIA (TIA), and cereb infrc w/o resid deficits; Anemia, unspecified; Chronic low back pain (1ry area of Pain) (Bilateral) (L>R) w/o sciatica; Difficulty sleeping; Dorsalgia, unspecified; History of stroke; Chronic anticoagulation  (Eliquis); Presence of aortocoronary bypass graft; Presence of coronary angioplasty implant and graft; Repeated falls; Chronic pain syndrome; Pharmacologic therapy; Disorder of skeletal system; Problems influencing health status; Cervicalgia; Chronic neck pain (2ry area of Pain) (Bilateral) (L>R); Chronic lower extremity pain (3ry area of Pain) (Bilateral) (L>R); Chronic thigh pain (Bilateral); Chronic hand pain (Left); Chronic hand pain (Right); Vitamin D deficiency; Osteopenia determined by x-ray; Lumbar compression fracture (Old) (Multilevel), sequela; Chronic cervical radiculopathy (Bilateral); DDD (degenerative disc disease), cervical; and Foraminal stenosis of cervical region on their problem list. Her primarily concern today is the Back Pain (lower) and Neck Pain (States pain radiates to shoulder and down arms bilateral)  Pain Assessment: Location: Lower, Right, Left Back Radiating: hips/bttocks down side of legs to feet, left side worse Onset: More than a month ago Duration: Chronic pain Quality: Aching, Burning, Throbbing, Stabbing Severity: 5 /10 (subjective, self-reported pain score)  Effect on ADL: prolonged standing and walking Timing: Constant Modifying factors: heat, rest BP: (!) 140/102  HR: 68  Carmen [name] comes in today for a follow-up visit after her initial evaluation on 03/14/2021. Today we went over the results of her tests. These were explained in "Layman's terms". During today's appointment we went over my diagnostic impression, as well as the proposed treatment plan.  Review of initial evaluation: "The patient comes into the clinic today accompanied by one of her daughters.  According to them the primary area of pain is that of the lower back (Bilateral) (L>R).  He denies any prior back surgeries, recent x-rays or imaging, she does admit to having had some physical therapy while she was a resident in [place].  She also indicates having had acupuncture which did not work  and apparently there were some issues with bleeding secondary to her blood thinners.  Apparently she had a single lumbar epidural steroid  injection done in [facility] around October 2021.  This apparently did help her pain.  She was scheduled to have a series of 3, but her physician contracted COVID and had to cancel the rest of the treatments.  She had this injections done [facility] by Dr. Nadara Mustard, a pain medicine specialist in [facility].   The patient secondary area of pain is that of the neck and the posterior aspect (Bilateral) (L>R).  They deny any surgery, but they indicate having had some x-rays done in [facility] and [place], neither of which are available.  The patient indicates having had some "stapling" in the cervical region.  She does indicate having intermittent distal upper extremity numbness, bilaterally.  She indicates that when she gets that numbness, she simply shakes the arms and it goes away.   The patient's third area pain is that of her lower extremities (Bilateral) (L>R).  In the case of the left upper extremity the pain goes down to the ankle through the anterior and posterior aspect of the leg but does not go into the foot.  In the case of the right lower extremity pain it runs to the back of the knee and the anterior thigh.  In fact, the patient indicates having pain in the anterior thigh on both lower extremities.  She denies any surgeries, recent x-rays, or physical therapy of that area.  No interventional therapies for pain in the lower extremities.   The patient is fourth area pain is that of the upper extremities where she is experiencing pain in both hands (Bilateral).  She indicates having arthritis of both hands.  She denies pain in the shoulders or elbows.  She also denies pain in the wrists or forearms.   The patient is fifth area pain if that of the shoulders, bilaterally, and upper back, which is intermittent and she  currently has no pain in that area.  No history of x-rays, prior surgery, or injection therapies in those areas.   The patient in addition has multiple medical problems including a history of a prior stroke around September 2021 with memory deficits and balance issues.  She has been falling a lot secondary to that prior stroke.  She is also on Eliquis and ASA.  She was being prescribed some Vicodin by her nurse practitioner (PCP).  She appears to have been on physical therapy since March 2022 for this balance problem.  She apparently resides in an independent living facility by herself.  Her caregiver he is one of her daughters, but apparently there are some family issues with the other daughter that was taking care of her in [place]."   Today we went over the results of her lab work as well as the imaging.  Cervical and lumbar x-rays have raised further questions.  The patient describes having chronic neck pain as well as bilateral cervical radiculopathy/radiculitis.  I will be ordering an MRI of the cervical spine since the x-rays indicated that the patient has foraminal stenosis but did not give [place] any levels.  In the case of the lumbar spine, the patient is having primarily axial pain and the x-rays indicate that there are multilevel old Lumbar compression fractures, but again no levels were provided in the official report.  A CT scan of the lumbar spine will be ordered for further evaluation.  In considering the treatment plan options, Ms. Reesor was reminded that I no longer take patients for medication  management only. I asked her to let me know if she had no intention of taking advantage of the interventional therapies, so that we could make arrangements to provide this space to someone interested. I also made it clear that undergoing interventional therapies for the purpose of getting pain medications is very inappropriate on the part of a patient, and it will not be tolerated in this practice. This type  of behavior would suggest true addiction and therefore it requires referral to an addiction specialist.   Further details on both, my assessment(s), as well as the proposed treatment plan, please see below.  Controlled Substance Pharmacotherapy Assessment REMS (Risk Evaluation and Mitigation Strategy)  Analgesic: Hydrocodone/APAP 5/325, 1 tab p.o. every 4 hours (30 mg/day of hydrocodone) (30 MME) (last filled on 04/21/2021) MME/day: 30 mg/day  Pill Count: None expected due to no prior prescriptions written by our practice. No notes on file Pharmacokinetics: Liberation and absorption (onset of action): WNL Distribution (time to peak effect): WNL Metabolism and excretion (duration of action): WNL         Pharmacodynamics: Desired effects: Analgesia: Ms. Marquina reports >50% benefit. Functional ability: Patient reports that medication allows her to accomplish basic ADLs Clinically meaningful improvement in function (CMIF): Sustained CMIF goals met Perceived effectiveness: Described as relatively effective, allowing for increase in activities of daily living (ADL) Undesirable effects: Side-effects or Adverse reactions: None reported Monitoring: [place] PMP: PDMP reviewed during this encounter. Online review of the past 23-monthperiod previously conducted. Not applicable at this point since we have not taken over the patient's medication management yet. List of other Serum/Urine Drug Screening Test(s):  Lab Results  Component Value Date   COCAINSCRNUR NONE DETECTED 10/07/2020   THCU NONE DETECTED 10/07/2020   List of all UDS test(s) done:  Lab Results  Component Value Date   SUMMARY Note 03/14/2021   Last UDS on record: Summary  Date Value Ref Range Status  03/14/2021 Note  Final    Comment:    ==================================================================== Compliance Drug Analysis, Ur ==================================================================== Test                              Result       Flag       Units  Drug Present and Declared for Prescription Verification   Hydrocodone                    900          EXPECTED   ng/mg creat   Hydromorphone                  196          EXPECTED   ng/mg creat   Dihydrocodeine                 166          EXPECTED   ng/mg creat   Norhydrocodone                 1484         EXPECTED   ng/mg creat    Sources of hydrocodone include scheduled prescription medications.    Hydromorphone, dihydrocodeine and norhydrocodone are expected    metabolites of hydrocodone. Hydromorphone and dihydrocodeine are    also available as scheduled prescription medications.    Trazodone  PRESENT      EXPECTED   1,3 chlorophenyl piperazine    PRESENT      EXPECTED    1,3-chlorophenyl piperazine is an expected metabolite of trazodone.    Acetaminophen                  PRESENT      EXPECTED   Metoprolol                     PRESENT      EXPECTED  Drug Present not Declared for Prescription Verification   Ephedrine/Pseudoephedrine      PRESENT      UNEXPECTED   Phenylpropanolamine            PRESENT      UNEXPECTED    Source of ephedrine/pseudoephedrine is most commonly pseudoephedrine    in over-the-counter or prescription cold and allergy medications.    Phenylpropanolamine is an expected metabolite of    ephedrine/pseudoephedrine.    Guaifenesin                    PRESENT      UNEXPECTED    Guaifenesin may be administered as an over-the-counter or    prescription drug; it may also be present as a breakdown product of    methocarbamol.  Drug Absent but Declared for Prescription Verification   Clonazepam                     Not Detected UNEXPECTED ng/mg creat   Salicylate                     Not Detected UNEXPECTED    Aspirin, as indicated in the declared medication list, is not always    detected even when used as directed.  ==================================================================== Test                       Result    Flag   Units      Ref Range   Creatinine              56               mg/dL      >=20 ==================================================================== Declared Medications:  The flagging and interpretation on this report are based on the  following declared medications.  Unexpected results may arise from  inaccuracies in the declared medications.   **Note: The testing scope of this panel includes these medications:   Clonazepam (Klonopin)  Hydrocodone (Norco)  Metoprolol (Toprol)  Trazodone (Desyrel)   **Note: The testing scope of this panel does not include small to  moderate amounts of these reported medications:   Acetaminophen (Norco)  Aspirin   **Note: The testing scope of this panel does not include the  following reported medications:   Amlodipine (Lotrel)  Apixaban (Eliquis)  Atorvastatin (Lipitor)  Benazepril (Lotrel)  Cyanocobalamin  Fluconazole (Diflucan)  Levothyroxine (Synthroid)  Nystatin  Triamcinolone ==================================================================== For clinical consultation, please call [phone]. ====================================================================    UDS interpretation: No unexpected findings.          Medication Assessment Form: Patient introduced to form today Treatment compliance: Treatment may start today if patient agrees with proposed plan. Evaluation of compliance is not applicable at this point Risk Assessment Profile: Aberrant behavior: See initial evaluations. None observed or detected today Comorbid factors increasing risk of overdose: See initial evaluation. No additional risks detected today Opioid  risk tool (ORT):  No flowsheet data found.  ORT Scoring interpretation table:  Score <3 = Low Risk for SUD  Score between 4-7 = Moderate Risk for SUD  Score >8 = High Risk for Opioid Abuse   Risk of substance use disorder (SUD): Low  Risk Mitigation Strategies:  Patient opioid  safety counseling: Completed today. Counseling provided to patient as per "Patient Counseling Document". Document signed by patient, attesting to counseling and understanding Patient-Prescriber Agreement (PPA): Obtained today.  Controlled substance notification to other providers: Written and sent today.  Pharmacologic Plan: Today we may be taking over the patient's pharmacological regimen. See below.             Laboratory Chemistry Profile   Renal Lab Results  Component Value Date   BUN 24 (H) 03/14/2021   CREATININE 0.86 03/14/2021   BCR 13 11/22/2020   GFRAA 73 11/22/2020   GFRNONAA >60 03/14/2021   PROTEINUR 30 (A) 10/07/2020     Electrolytes Lab Results  Component Value Date   NA 135 03/14/2021   K 4.5 03/14/2021   CL 103 03/14/2021   CALCIUM 9.5 03/14/2021   MG 1.9 03/14/2021     Hepatic Lab Results  Component Value Date   AST 23 03/14/2021   ALT 19 03/14/2021   ALBUMIN 4.9 03/14/2021   ALKPHOS 60 03/14/2021   AMMONIA 19 10/06/2020     ID Lab Results  Component Value Date   SARSCOV2NAA NEGATIVE 10/12/2020     Bone Lab Results  Component Value Date   25OHVITD1 12 (L) 03/14/2021   25OHVITD2 <1.0 03/14/2021   25OHVITD3 12 03/14/2021     Endocrine Lab Results  Component Value Date   GLUCOSE 102 (H) 03/14/2021   GLUCOSEU NEGATIVE 10/07/2020   HGBA1C 5.8 (H) 10/08/2020   TSH 1.270 11/22/2020     Neuropathy Lab Results  Component Value Date   VITAMINB12 590 03/14/2021   FOLATE 10.7 11/22/2020   HGBA1C 5.8 (H) 10/08/2020     CNS No results found for: COLORCSF, APPEARCSF, RBCCOUNTCSF, WBCCSF, POLYSCSF, LYMPHSCSF, EOSCSF, PROTEINCSF, GLUCCSF, JCVIRUS, CSFOLI, IGGCSF, LABACHR, ACETBL, LABACHR, ACETBL   Inflammation (CRP: Acute  ESR: Chronic) Lab Results  Component Value Date   CRP 0.8 03/14/2021   ESRSEDRATE 4 03/14/2021     Rheumatology No results found for: RF, ANA, LABURIC, URICUR, LYMEIGGIGMAB, LYMEABIGMQN, HLAB27   Coagulation Lab  Results  Component Value Date   INR 1.2 03/14/2021   LABPROT 15.3 (H) 03/14/2021   APTT 30 03/14/2021   PLT 267 11/22/2020     Cardiovascular Lab Results  Component Value Date   HGB 11.8 11/22/2020   HCT 36.6 11/22/2020     Screening Lab Results  Component Value Date   SARSCOV2NAA NEGATIVE 10/12/2020     Cancer No results found for: CEA, CA125, LABCA2   Allergens No results found for: ALMOND, APPLE, ASPARAGUS, AVOCADO, BANANA, BARLEY, BASIL, BAYLEAF, GREENBEAN, LIMABEAN, WHITEBEAN, BEEFIGE, REDBEET, BLUEBERRY, BROCCOLI, CABBAGE, MELON, CARROT, CASEIN, CASHEWNUT, CAULIFLOWER, CELERY     Note: Lab results reviewed.  Recent Diagnostic Imaging Review  Cervical Imaging: Cervical DG Bending/F/E views: Results for orders placed during the hospital encounter of 03/14/21 DG Cervical Spine With Flex & Extend  Narrative CLINICAL DATA:  80 year old female with neck pain.  EXAM: CERVICAL SPINE COMPLETE WITH FLEXION AND EXTENSION VIEWS  COMPARISON:  None.  FINDINGS: There is no acute fracture or subluxation of the cervical spine. There is straightening of normal cervical lordosis which may be positional or  due to muscle spasm. The visualized posterior elements and odontoid appear intact. There is anatomic alignment of the lateral masses of C1 and C2. The bones are osteopenic. Multilevel facet arthropathy and mild bilateral neural foraminal narrowing. The soft tissues are unremarkable.  IMPRESSION: 1. No acute/traumatic cervical spine pathology. 2. Osteopenia.   Electronically Signed By: Anner Crete M.D. On: 03/14/2021 21:49  Lumbosacral Imaging: Lumbar DG Bending views: Results for orders placed during the hospital encounter of 03/14/21 DG Lumbar Spine Complete W/Bend  Narrative CLINICAL DATA:  80 year old female with low back pain.  EXAM: LUMBAR SPINE - COMPLETE WITH BENDING VIEWS  COMPARISON:  None.  FINDINGS: Five lumbar type vertebra. There is no  acute fracture or subluxation of the lumbar spine. Mild multilevel old compression. The visualized posterior elements are intact. Lower lumbar facet arthropathy. There is atherosclerotic calcification of the abdominal aorta. The soft tissues are unremarkable.  IMPRESSION: 1. No acute/traumatic lumbar spine pathology. 2. Mild multilevel old compression fractures.   Electronically Signed By: Anner Crete M.D. On: 03/14/2021 21:51  Hip Imaging: Hip-R DG 2-3 views: Results for orders placed during the hospital encounter of 03/14/21 DG HIP UNILAT W OR W/O PELVIS 2-3 VIEWS RIGHT  Narrative CLINICAL DATA:  Bilateral hip pain  EXAM: DG HIP (WITH OR WITHOUT PELVIS) 2-3V RIGHT  COMPARISON:  None.  FINDINGS: There is no evidence of hip fracture or dislocation. There is no evidence of arthropathy or other focal bone abnormality.  IMPRESSION: Negative.   Electronically Signed By: Franchot Gallo M.D. On: 03/15/2021 08:49  Hip-L DG 2-3 views: Results for orders placed during the hospital encounter of 03/14/21 DG HIP UNILAT W OR W/O PELVIS 2-3 VIEWS LEFT  Narrative CLINICAL DATA:  Bilateral hip pain  EXAM: DG HIP (WITH OR WITHOUT PELVIS) 2-3V LEFT  COMPARISON:  10/07/2020  FINDINGS: There is no evidence of hip fracture or dislocation. There is no evidence of arthropathy or other focal bone abnormality.  IMPRESSION: Negative.   Electronically Signed By: Franchot Gallo M.D. On: 03/15/2021 08:49  Hand Imaging: Hand-R DG Complete: Results for orders placed during the hospital encounter of 03/14/21 DG Hand Complete Right  Narrative CLINICAL DATA:  Chronic bilateral hand pain due to arthritis.  EXAM: RIGHT HAND - COMPLETE 3+ VIEW  COMPARISON:  None.  FINDINGS: Negative for fracture or dislocation.  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 Degenerative change also in the first IP joint.  Mild degenerative change in the metacarpophalangeal joints. Mild degenerative change base of thumb. Carpal bones intact. No erosions.  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  No acute finding.   Electronically Signed By: Franchot Gallo M.D. On: 03/15/2021 08:48  Hand-L DG Complete: Results for orders placed during the hospital encounter of 03/14/21 DG Hand Complete Left  Narrative CLINICAL DATA:  80 year old female with chronic bilateral hand pain.  EXAM: LEFT HAND - COMPLETE 3+ VIEW  COMPARISON:  Right hand radiograph dated 03/14/2021.  FINDINGS: There is no acute fracture or dislocation. The bones are osteopenic. There is arthritic changes of the interphalangeal joints most severe at proximal interphalangeal joint of the third digit as well as interphalangeal joint of the thumb. The soft tissues are unremarkable.  IMPRESSION: 1. No acute fracture or dislocation. 2. Osteopenia and arthritic changes.   Electronically Signed By: Anner Crete M.D. On: 03/14/2021 21:54  Complexity Note: Imaging results reviewed. Results shared with Ms. Traore, using Layman's terms.  Meds   Current Outpatient Medications:    amLODipine-benazepril (LOTREL) 5-10 MG capsule, Take 1 capsule by mouth daily., Disp: 90 capsule, Rfl: 1   apixaban (ELIQUIS) 5 MG TABS tablet, Take 1 tablet (5 mg total) by mouth 2 (two) times daily., Disp: 60 tablet, Rfl:    aspirin EC 81 MG tablet, Take 81 mg by mouth daily. Swallow whole., Disp: , Rfl:    atorvastatin (LIPITOR) 20 MG tablet, Take 1 tablet (20 mg total) by mouth at bedtime., Disp: 30 tablet, Rfl: 3   calcium carbonate (OSCAL) 1500 (600 Ca) MG TABS tablet, Take 1 tablet (1,500 mg total) by mouth 2 (two) times daily with a meal., Disp: 60 tablet, Rfl: 2   Cholecalciferol (VITAMIN D3) 125 MCG (5000 UT) CAPS, Take 1 capsule (5,000 Units total) by mouth daily with breakfast. Take along with calcium and magnesium.,  Disp: 30 capsule, Rfl: 2   clonazePAM (KLONOPIN) 0.5 MG tablet, Take 1 tablet (0.5 mg total) by mouth daily as needed for anxiety., Disp: 30 tablet, Rfl: 0   diazepam (VALIUM) 5 MG tablet, Take 1 tablet (5 mg total) by mouth 60 (sixty) minutes before procedure for 2 doses., Disp: 4 tablet, Rfl: 0   ergocalciferol (VITAMIN D2) 1.25 MG (50000 UT) capsule, Take 1 capsule (50,000 Units total) by mouth 2 (two) times a week. X 6 weeks., Disp: 12 capsule, Rfl: 0   HYDROcodone-acetaminophen (NORCO/VICODIN) 5-325 MG tablet, Take 1 tablet by mouth 2 (two) times daily as needed for severe pain. Must last 30 days., Disp: 60 tablet, Rfl: 0   levothyroxine (SYNTHROID) 100 MCG tablet, Take 100 mcg by mouth daily before breakfast., Disp: , Rfl:    metoprolol succinate (TOPROL-XL) 100 MG 24 hr tablet, Take 50 mg by mouth daily., Disp: , Rfl:    nystatin-triamcinolone ointment (MYCOLOG), Apply 1 application topically 2 (two) times daily. Prn not for face, private area, underarms, Disp: 30 g, Rfl: 0   traZODone (DESYREL) 50 MG tablet, Take 0.5 tablets (25 mg total) by mouth at bedtime as needed for sleep., Disp: 90 tablet, Rfl: 1   vitamin B-12 (CYANOCOBALAMIN) 500 MCG tablet, Take 500 mcg by mouth every Monday, Wednesday, and Friday., Disp: , Rfl:   ROS  Constitutional: Denies any fever or chills Gastrointestinal: No reported hemesis, hematochezia, vomiting, or acute GI distress Musculoskeletal: Denies any acute onset joint swelling, redness, loss of ROM, or weakness Neurological: No reported episodes of acute onset apraxia, aphasia, dysarthria, agnosia, amnesia, paralysis, loss of coordination, or loss of consciousness  Allergies  Ms. Matousek has No Known Allergies.  PFSH  Drug: Ms. Reffett  reports no history of drug use. Alcohol:  reports previous alcohol use. Tobacco:  reports that she quit smoking about a year ago. Her smoking use included cigarettes. She has never used smokeless tobacco. Medical:  has a past  medical history of Allergy, Anemia, Anxiety, Arthritis, Back pain, Coronary artery disease, Depression, Hypertension, Peripheral vascular disease ([place]), Stroke ([place]), and Thyroid disease. Surgical: Ms. Rentz  has a past surgical history that includes Cardiac catheterization; Coronary angioplasty; Coronary artery bypass graft; and back injections. Family: family history includes Bipolar disorder in her daughter; Breast cancer in her mother; Heart attack in her father.  Constitutional Exam  General appearance: Well nourished, well developed, and well hydrated. In no apparent acute distress Vitals:   05/02/21 1234  BP: (!) 140/102  Pulse: 68  Resp: 16  Temp: (!) 97 F (36.1 C)  SpO2: 97%  Weight: 181 lb (  82.1 kg)  Height: 5' 1" (1.549 m)   BMI Assessment: Estimated body mass index is 34.2 kg/m as calculated from the following:   Height as of this encounter: 5' 1" (1.549 m).   Weight as of this encounter: 181 lb (82.1 kg).  BMI interpretation table: BMI level Category Range association with higher incidence of chronic pain  <18 kg/m2 Underweight   18.5-24.9 kg/m2 Ideal body weight   25-29.9 kg/m2 Overweight Increased incidence by 20%  30-34.9 kg/m2 Obese (Class I) Increased incidence by 68%  35-39.9 kg/m2 Severe obesity (Class II) Increased incidence by 136%  >40 kg/m2 Extreme obesity (Class III) Increased incidence by 254%   Patient's current BMI Ideal Body weight  Body mass index is 34.2 kg/m. Ideal body weight: 47.8 kg (105 lb 6.1 oz) Adjusted ideal body weight: 61.5 kg (135 lb 10 oz)   BMI Readings from Last 4 Encounters:  05/02/21 34.20 kg/m  03/24/21 35.45 kg/m  03/14/21 34.20 kg/m  02/03/21 35.35 kg/m   Wt Readings from Last 4 Encounters:  05/02/21 181 lb (82.1 kg)  03/24/21 181 lb 8 oz (82.3 kg)  03/14/21 181 lb (82.1 kg)  02/03/21 187 lb (84.8 kg)    Psych/Mental status: Alert, oriented x 3 (person, place, & time)       Eyes: PERLA Respiratory: No evidence  of acute respiratory distress  Assessment & Plan  Primary Diagnosis & Pertinent Problem List: The primary encounter diagnosis was Chronic pain syndrome. 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, Other intervertebral disc degeneration, lumbar region, Lumbar compression fracture (Old) (Multilevel), sequela, Chronic neck pain (2ry area of Pain) (Bilateral) (L>R), Chronic lower extremity pain (3ry area of Pain) (Bilateral) (L>R), Osteopenia determined by x-ray, Pharmacologic therapy, Chronic anticoagulation (Eliquis), Chronic use of opiate for therapeutic purpose, Encounter for pain management counseling, Vitamin D deficiency, Cervicalgia, Anxiety due to invasive procedure, Chronic cervical radiculopathy (Bilateral), DDD (degenerative disc disease), cervical, Foraminal stenosis of cervical region, Encounter for medication management, and Encounter for pain management planning were also pertinent to this visit.  Visit Diagnosis: 1. Chronic pain syndrome   2. Chronic low back pain (1ry area of Pain) (Bilateral) (L>R) w/o sciatica   3. Other intervertebral disc degeneration, lumbar region   4. Lumbar compression fracture (Old) (Multilevel), sequela   5. Chronic neck pain (2ry area of Pain) (Bilateral) (L>R)   6. Chronic lower extremity pain (3ry area of Pain) (Bilateral) (L>R)   7. Osteopenia determined by x-ray   8. Pharmacologic therapy   9. Chronic anticoagulation (Eliquis)   10. Chronic use of opiate for therapeutic purpose   11. Encounter for pain management counseling   12. Vitamin D deficiency   13. Cervicalgia   14. Anxiety due to invasive procedure   15. Chronic cervical radiculopathy (Bilateral)   16. DDD (degenerative disc disease), cervical   17. Foraminal stenosis of cervical region   18. Encounter for medication management   19. Encounter for pain management planning    Problems updated and reviewed during this visit: Problem  Chronic  cervical radiculopathy (Bilateral)  Ddd (Degenerative Disc Disease), Cervical  Foraminal Stenosis of Cervical Region     Plan of Care  Pharmacotherapy (Medications Ordered): Meds ordered this encounter  Medications   ergocalciferol (VITAMIN D2) 1.25 MG (50000 UT) capsule    Sig: Take 1 capsule (50,000 Units total) by mouth 2 (two) times a week. X 6 weeks.    Dispense:  12 capsule  Refill:  0    Fill one day early if pharmacy is closed on scheduled refill date. May substitute for generic, or similar, if available.   Cholecalciferol (VITAMIN D3) 125 MCG (5000 UT) CAPS    Sig: Take 1 capsule (5,000 Units total) by mouth daily with breakfast. Take along with calcium and magnesium.    Dispense:  30 capsule    Refill:  2    Fill 1 day early if pharmacy is closed on scheduled refill date. Generic permitted. Do not send renewal requests.   calcium carbonate (OSCAL) 1500 (600 Ca) MG TABS tablet    Sig: Take 1 tablet (1,500 mg total) by mouth 2 (two) times daily with a meal.    Dispense:  60 tablet    Refill:  2    Fill one day early if pharmacy is closed on scheduled refill date. May substitute for generic, or similar, if available.   diazepam (VALIUM) 5 MG tablet    Sig: Take 1 tablet (5 mg total) by mouth 60 (sixty) minutes before procedure for 2 doses.    Dispense:  4 tablet    Refill:  0   HYDROcodone-acetaminophen (NORCO/VICODIN) 5-325 MG tablet    Sig: Take 1 tablet by mouth 2 (two) times daily as needed for severe pain. Must last 30 days.    Dispense:  60 tablet    Refill:  0    Chronic Pain: STOP Act (Not applicable) Fill 1 day early if closed on refill date. Avoid benzodiazepines within 8 hours of opioids    Procedure Orders    No procedure(s) ordered today   Lab Orders  No laboratory test(s) ordered today   Imaging Orders  CT LUMBAR SPINE WO CONTRAST  MR CERVICAL SPINE WO CONTRAST   Referral Orders  No referral(s) requested today    Pharmacological management  options:  Opioid Analgesics: We'll take over management today. See above orders Membrane stabilizer: Options discussed, including a trial. Muscle relaxant: We have discussed the possibility of a trial NSAID: Trial discussed. Other analgesic(s): To be determined at a later time      Interventional Therapies  Risk  Complexity Considerations:   Estimated body mass index is 34.2 kg/m as calculated from the following:   Height as of this encounter: 5' 1" (1.549 m).   Weight as of this encounter: 181 lb (82.1 kg). ELIQUIS + ASA Anticoagulation (Stop: 3 days  Restart: 6 hours)   Planned  Pending:   Pending further evaluation   Under consideration:   Pending results of CT and MRI  Diagnostic/therapeutic cervical ESI  Diagnostic/therapeutic lumbar facet MBB    Completed:   None at this time   Therapeutic  Palliative (PRN) options:   None established    Provider-requested follow-up: Return in about 30 days (around 06/01/2021) for (F2F-MM) E/M-day (M,W), to discuss imaging results. Recent Visits Date Type Provider Dept  03/14/21 Office Visit Milinda Pointer, MD [facility]  Showing recent visits within past 90 days and meeting all other requirements Today's Visits Date Type Provider Dept  05/02/21 Office Visit Milinda Pointer, MD [facility]  Showing today's visits and meeting all other requirements Future Appointments No visits were found meeting these conditions. Showing future appointments within next 90 days and meeting all other requirements Primary Care Physician: Doreen Beam, FNP Note by: Gaspar Cola, MD Date: 05/02/2021; Time: 1:28 PM

## 2021-05-01 ENCOUNTER — Other Ambulatory Visit: Payer: Self-pay | Admitting: Internal Medicine

## 2021-05-01 DIAGNOSIS — L853 Xerosis cutis: Secondary | ICD-10-CM

## 2021-05-01 MED ORDER — NYSTATIN-TRIAMCINOLONE 100000-0.1 UNIT/GM-% EX OINT: 1.0000 "application " | TOPICAL_OINTMENT | Freq: Two times a day (BID) | CUTANEOUS | 0 refills | Status: DC

## 2021-05-01 NOTE — Telephone Encounter (Signed)
Short term supply of steroid/antifungal cream nystatin/tmc further refills per PCP  If not feeling well rec [facility] urgent care or ED

## 2021-05-02 ENCOUNTER — Ambulatory Visit: Payer: Medicare Other | Attending: Pain Medicine | Admitting: Pain Medicine

## 2021-05-02 ENCOUNTER — Other Ambulatory Visit: Payer: Self-pay

## 2021-05-02 VITALS — BP 140/102 | HR 68 | Temp 97.0°F | Resp 16 | Ht 61.0 in | Wt 181.0 lb

## 2021-05-02 DIAGNOSIS — M79605 Pain in left leg: Secondary | ICD-10-CM | POA: Diagnosis present

## 2021-05-02 DIAGNOSIS — G8929 Other chronic pain: Secondary | ICD-10-CM | POA: Diagnosis present

## 2021-05-02 DIAGNOSIS — Z7901 Long term (current) use of anticoagulants: Secondary | ICD-10-CM | POA: Diagnosis present

## 2021-05-02 DIAGNOSIS — Z79899 Other long term (current) drug therapy: Secondary | ICD-10-CM | POA: Diagnosis present

## 2021-05-02 DIAGNOSIS — M8580 Other specified disorders of bone density and structure, unspecified site: Secondary | ICD-10-CM

## 2021-05-02 DIAGNOSIS — M542 Cervicalgia: Secondary | ICD-10-CM | POA: Diagnosis present

## 2021-05-02 DIAGNOSIS — I25810 Atherosclerosis of coronary artery bypass graft(s) without angina pectoris: Secondary | ICD-10-CM

## 2021-05-02 DIAGNOSIS — E559 Vitamin D deficiency, unspecified: Secondary | ICD-10-CM | POA: Insufficient documentation

## 2021-05-02 DIAGNOSIS — M5030 Other cervical disc degeneration, unspecified cervical region: Secondary | ICD-10-CM | POA: Insufficient documentation

## 2021-05-02 DIAGNOSIS — M5412 Radiculopathy, cervical region: Secondary | ICD-10-CM | POA: Diagnosis present

## 2021-05-02 DIAGNOSIS — S32000S Wedge compression fracture of unspecified lumbar vertebra, sequela: Secondary | ICD-10-CM | POA: Insufficient documentation

## 2021-05-02 DIAGNOSIS — Z7189 Other specified counseling: Secondary | ICD-10-CM | POA: Diagnosis present

## 2021-05-02 DIAGNOSIS — M545 Low back pain, unspecified: Secondary | ICD-10-CM | POA: Diagnosis present

## 2021-05-02 DIAGNOSIS — Z0189 Encounter for other specified special examinations: Secondary | ICD-10-CM | POA: Diagnosis present

## 2021-05-02 DIAGNOSIS — Z79891 Long term (current) use of opiate analgesic: Secondary | ICD-10-CM | POA: Diagnosis present

## 2021-05-02 DIAGNOSIS — F419 Anxiety disorder, unspecified: Secondary | ICD-10-CM | POA: Diagnosis present

## 2021-05-02 DIAGNOSIS — M5136 Other intervertebral disc degeneration, lumbar region: Secondary | ICD-10-CM | POA: Insufficient documentation

## 2021-05-02 DIAGNOSIS — M4802 Spinal stenosis, cervical region: Secondary | ICD-10-CM | POA: Insufficient documentation

## 2021-05-02 DIAGNOSIS — M79604 Pain in right leg: Secondary | ICD-10-CM | POA: Insufficient documentation

## 2021-05-02 DIAGNOSIS — G894 Chronic pain syndrome: Secondary | ICD-10-CM | POA: Diagnosis present

## 2021-05-02 MED ORDER — CALCIUM CARBONATE 1500 (600 CA) MG PO TABS: 600.0000 mg | ORAL_TABLET | Freq: Two times a day (BID) | ORAL | 2 refills | Status: DC

## 2021-05-02 MED ORDER — HYDROCODONE-ACETAMINOPHEN 5-325 MG PO TABS: 1.0000 | ORAL_TABLET | Freq: Two times a day (BID) | ORAL | 0 refills | Status: DC | PRN

## 2021-05-02 MED ORDER — DIAZEPAM 5 MG PO TABS: 5.0000 mg | ORAL_TABLET | ORAL | 0 refills | Status: DC

## 2021-05-02 MED ORDER — ERGOCALCIFEROL 1.25 MG (50000 UT) PO CAPS: 50000.0000 [IU] | ORAL_CAPSULE | ORAL | 0 refills | Status: AC

## 2021-05-02 MED ORDER — VITAMIN D3 125 MCG (5000 UT) PO CAPS: 1.0000 | ORAL_CAPSULE | Freq: Every day | ORAL | 2 refills | Status: AC

## 2021-05-02 NOTE — Telephone Encounter (Signed)
Called and spoke with Carmen [name]. Carmen [name] verbalized understanding and had no further questions.

## 2021-05-02 NOTE — Patient Instructions (Signed)
____________________________________________________________________________________________  Medication Rules  Purpose: To inform patients, and their family members, of our rules and regulations.  Applies to: All patients receiving prescriptions (written or electronic).  Pharmacy of record: Pharmacy where electronic prescriptions will be sent. If written prescriptions are taken to a different pharmacy, please inform the nursing staff. The pharmacy listed in the electronic medical record should be the one where you would like electronic prescriptions to be sent.  Electronic prescriptions: In compliance with the [place] Strengthen Opioid Misuse Prevention (STOP) Act of 2017 (Session Law 2017-74/H243), effective October 02, 2018, all controlled substances must be electronically prescribed. Calling prescriptions to the pharmacy will cease to exist.  Prescription refills: Only during scheduled appointments. Applies to all prescriptions.  NOTE: The following applies primarily to controlled substances (Opioid* Pain Medications).   Type of encounter (visit): For patients receiving controlled substances, face-to-face visits are required. (Not an option or up to the patient.)  Patient's responsibilities: Pain Pills: Bring all pain pills to every appointment (except for procedure appointments). Pill Bottles: Bring pills in original pharmacy bottle. Always bring the newest bottle. Bring bottle, even if empty. Medication refills: You are responsible for knowing and keeping track of what medications you take and those you need refilled. The day before your appointment: write a list of all prescriptions that need to be refilled. The day of the appointment: give the list to the admitting nurse. Prescriptions will be written only during appointments. No prescriptions will be written on procedure days. If you forget a medication: it will not be "Called in", "Faxed", or "electronically sent". You will  need to get another appointment to get these prescribed. No early refills. Do not call asking to have your prescription filled early. Prescription Accuracy: You are responsible for carefully inspecting your prescriptions before leaving our office. Have the discharge nurse carefully go over each prescription with you, before taking them home. Make sure that your name is accurately spelled, that your address is correct. Check the name and dose of your medication to make sure it is accurate. Check the number of pills, and the written instructions to make sure they are clear and accurate. Make sure that you are given enough medication to last until your next medication refill appointment. Taking Medication: Take medication as prescribed. When it comes to controlled substances, taking less pills or less frequently than prescribed is permitted and encouraged. Never take more pills than instructed. Never take medication more frequently than prescribed.  Inform other Doctors: Always inform, all of your healthcare providers, of all the medications you take. Pain Medication from other Providers: You are not allowed to accept any additional pain medication from any other Doctor or Healthcare provider. There are two exceptions to this rule. (see below) In the event that you require additional pain medication, you are responsible for notifying us, as stated below. Cough Medicine: Often these contain an opioid, such as codeine or hydrocodone. Never accept or take cough medicine containing these opioids if you are already taking an opioid* medication. The combination may cause respiratory failure and death. Medication Agreement: You are responsible for carefully reading and following our Medication Agreement. This must be signed before receiving any prescriptions from our practice. Safely store a copy of your signed Agreement. Violations to the Agreement will result in no further prescriptions. (Additional copies of our  Medication Agreement are available upon request.) Laws, Rules, & Regulations: All patients are expected to follow all Federal and State Laws, Statutes, Rules, & Regulations. Ignorance of   the Laws does not constitute a valid excuse.  Illegal drugs and Controlled Substances: The use of illegal substances (including, but not limited to marijuana and its derivatives) and/or the illegal use of any controlled substances is strictly prohibited. Violation of this rule may result in the immediate and permanent discontinuation of any and all prescriptions being written by our practice. The use of any illegal substances is prohibited. Adopted CDC guidelines & recommendations: Target dosing levels will be at or below 60 MME/day. Use of benzodiazepines** is not recommended.  Exceptions: There are only two exceptions to the rule of not receiving pain medications from other Healthcare Providers. Exception #1 (Emergencies): In the event of an emergency (i.e.: accident requiring emergency care), you are allowed to receive additional pain medication. However, you are responsible for: As soon as you are able, call our office [phone], at any time of the day or night, and leave a message stating your name, the date and nature of the emergency, and the name and dose of the medication prescribed. In the event that your call is answered by a member of our staff, make sure to document and save the date, time, and the name of the person that took your information.  Exception #2 (Planned Surgery): In the event that you are scheduled by another doctor or dentist to have any type of surgery or procedure, you are allowed (for a period no longer than 30 days), to receive additional pain medication, for the acute post-op pain. However, in this case, you are responsible for picking up a copy of our "Post-op Pain Management for Surgeons" handout, and giving it to your surgeon or dentist. This document is available at our office, and  does not require an appointment to obtain it. Simply go to our office during business hours (Monday-Thursday from 8:00 AM to 4:00 PM) (Friday 8:00 AM to 12:00 Noon) or if you have a scheduled appointment with us, prior to your surgery, and ask for it by name. In addition, you are responsible for: calling our office [phone], at any time of the day or night, and leaving a message stating your name, name of your surgeon, type of surgery, and date of procedure or surgery. Failure to comply with your responsibilities may result in termination of therapy involving the controlled substances.  *Opioid medications include: morphine, codeine, oxycodone, oxymorphone, hydrocodone, hydromorphone, meperidine, tramadol, tapentadol, buprenorphine, fentanyl, methadone. **Benzodiazepine medications include: diazepam (Valium), alprazolam (Xanax), clonazepam (Klonopine), lorazepam (Ativan), clorazepate (Tranxene), chlordiazepoxide (Librium), estazolam (Prosom), oxazepam (Serax), temazepam (Restoril), triazolam (Halcion) (Last updated: 08/30/2020) ____________________________________________________________________________________________  ____________________________________________________________________________________________  Medication Recommendations and Reminders  Applies to: All patients receiving prescriptions (written and/or electronic).  Medication Rules & Regulations: These rules and regulations exist for your safety and that of others. They are not flexible and neither are we. Dismissing or ignoring them will be considered "non-compliance" with medication therapy, resulting in complete and irreversible termination of such therapy. (See document titled "Medication Rules" for more details.) In all conscience, because of safety reasons, we cannot continue providing a therapy where the patient does not follow instructions.  Pharmacy of record:  Definition: This is the pharmacy where your electronic  prescriptions will be sent.  We do not endorse any particular pharmacy, however, we have experienced problems with [facility] not securing enough medication supply for the community. We do not restrict you in your choice of pharmacy. However, once we write for your prescriptions, we will NOT be re-sending more prescriptions to fix restricted supply problems   created by your pharmacy, or your insurance.  The pharmacy listed in the electronic medical record should be the one where you want electronic prescriptions to be sent. If you choose to change pharmacy, simply notify our nursing staff.  Recommendations: Keep all of your pain medications in a safe place, under lock and key, even if you live alone. We will NOT replace lost, stolen, or damaged medication. After you fill your prescription, take 1 week's worth of pills and put them away in a safe place. You should keep a separate, properly labeled bottle for this purpose. The remainder should be kept in the original bottle. Use this as your primary supply, until it runs out. Once it's gone, then you know that you have 1 week's worth of medicine, and it is time to come in for a prescription refill. If you do this correctly, it is unlikely that you will ever run out of medicine. To make sure that the above recommendation works, it is very important that you make sure your medication refill appointments are scheduled at least 1 week before you run out of medicine. To do this in an effective manner, make sure that you do not leave the office without scheduling your next medication management appointment. Always ask the nursing staff to show you in your prescription , when your medication will be running out. Then arrange for the receptionist to get you a return appointment, at least 7 days before you run out of medicine. Do not wait until you have 1 or 2 pills left, to come in. This is very poor planning and does not take into consideration that we may need to  cancel appointments due to bad weather, sickness, or emergencies affecting our staff. DO NOT ACCEPT A "Partial Fill": If for any reason your pharmacy does not have enough pills/tablets to completely fill or refill your prescription, do not allow for a "partial fill". The law allows the pharmacy to complete that prescription within 72 hours, without requiring a new prescription. If they do not fill the rest of your prescription within those 72 hours, you will need a separate prescription to fill the remaining amount, which we will NOT provide. If the reason for the partial fill is your insurance, you will need to talk to the pharmacist about payment alternatives for the remaining tablets, but again, DO NOT ACCEPT A PARTIAL FILL, unless you can trust your pharmacist to obtain the remainder of the pills within 72 hours.  Prescription refills and/or changes in medication(s):  Prescription refills, and/or changes in dose or medication, will be conducted only during scheduled medication management appointments. (Applies to both, written and electronic prescriptions.) No refills on procedure days. No medication will be changed or started on procedure days. No changes, adjustments, and/or refills will be conducted on a procedure day. Doing so will interfere with the diagnostic portion of the procedure. No phone refills. No medications will be "called into the pharmacy". No Fax refills. No weekend refills. No Holliday refills. No after hours refills.  Remember:  Business hours are:  Monday to Thursday 8:00 AM to 4:00 PM Provider's Schedule: Demontae Antunes, MD - Appointments are:  Medication management: Monday and Wednesday 8:00 AM to 4:00 PM Procedure day: Tuesday and Thursday 7:30 AM to 4:00 PM Bilal Lateef, MD - Appointments are:  Medication management: Tuesday and Thursday 8:00 AM to 4:00 PM Procedure day: Monday and Wednesday 7:30 AM to 4:00 PM (Last update:  04/21/2020) ____________________________________________________________________________________________  ____________________________________________________________________________________________  CBD (cannabidiol) WARNING    Applicable to: All individuals currently taking or considering taking CBD (cannabidiol) and, more important, all patients taking opioid analgesic controlled substances (pain medication). (Example: oxycodone; oxymorphone; hydrocodone; hydromorphone; morphine; methadone; tramadol; tapentadol; fentanyl; buprenorphine; butorphanol; dextromethorphan; meperidine; codeine; etc.)  Legal status: CBD remains a Schedule I drug prohibited for any use. CBD is illegal with one exception. In the [place], CBD has a limited Food and Drug Administration (FDA) approval for the treatment of two specific types of epilepsy disorders. Only one CBD product has been approved by the FDA for this purpose: "Epidiolex". FDA is aware that some companies are marketing products containing cannabis and cannabis-derived compounds in ways that violate the Federal Food, Drug and Cosmetic Act (FD&C Act) and that may put the health and safety of consumers at risk. The FDA, a Federal agency, has not enforced the CBD status since 2018.   Legality: Some manufacturers ship CBD products nationally, which is illegal. Often such products are sold online and are therefore available throughout the country. CBD is openly sold in head shops and health food stores in some states where such sales have not been explicitly legalized. Selling unapproved products with unsubstantiated therapeutic claims is not only a violation of the law, but also can put patients at risk, as these products have not been proven to be safe or effective. Federal illegality makes it difficult to conduct research on CBD.  Reference: "FDA Regulation of Cannabis and Cannabis-Derived Products, Including Cannabidiol (CBD)" -  https://www.fda.gov/news-events/public-health-focus/fda-regulation-cannabis-and-cannabis-derived-products-including-cannabidiol-cbd  Warning: CBD is not FDA approved and has not undergo the same manufacturing controls as prescription drugs.  This means that the purity and safety of available CBD may be questionable. Most of the time, despite manufacturer's claims, it is contaminated with THC (delta-9-tetrahydrocannabinol - the chemical in marijuana responsible for the "HIGH").  When this is the case, the THC contaminant will trigger a positive urine drug screen (UDS) test for Marijuana (carboxy-THC). Because a positive UDS for any illicit substance is a violation of our medication agreement, your opioid analgesics (pain medicine) may be permanently discontinued.  MORE ABOUT CBD  General Information: CBD  is a derivative of the Marijuana (cannabis sativa) plant discovered in 1940. It is one of the 113 identified substances found in Marijuana. It accounts for up to 40% of the plant's extract. As of 2018, preliminary clinical studies on CBD included research for the treatment of anxiety, movement disorders, and pain. CBD is available and consumed in multiple forms, including inhalation of smoke or vapor, as an aerosol spray, and by mouth. It may be supplied as an oil containing CBD, capsules, dried cannabis, or as a liquid solution. CBD is thought not to be as psychoactive as THC (delta-9-tetrahydrocannabinol - the chemical in marijuana responsible for the "HIGH"). Studies suggest that CBD may interact with different biological target receptors in the body, including cannabinoid and other neurotransmitter receptors. As of 2018 the mechanism of action for its biological effects has not been determined.  Side-effects  Adverse reactions: Dry mouth, diarrhea, decreased appetite, fatigue, drowsiness, malaise, weakness, sleep disturbances, and others.  Drug interactions: CBC may interact with other medications  such as blood-thinners. (Last update: 05/08/2020) ____________________________________________________________________________________________  ____________________________________________________________________________________________  Drug Holidays (Slow)  What is a "Drug Holiday"? Drug Holiday: is the name given to the period of time during which a patient stops taking a medication(s) for the purpose of eliminating tolerance to the drug.  Benefits Improved effectiveness of opioids. Decreased opioid dose needed to achieve benefits. Improved pain with lesser dose.    What is tolerance? Tolerance: is the progressive decreased in effectiveness of a drug due to its repetitive use. With repetitive use, the body gets use to the medication and as a consequence, it loses its effectiveness. This is a common problem seen with opioid pain medications. As a result, a larger dose of the drug is needed to achieve the same effect that used to be obtained with a smaller dose.  How long should a "Drug Holiday" last? You should stay off of the pain medicine for at least 14 consecutive days. (2 weeks)  Should I stop the medicine "cold turkey"? No. You should always coordinate with your Pain Specialist so that he/she can provide you with the correct medication dose to make the transition as smoothly as possible.  How do I stop the medicine? Slowly. You will be instructed to decrease the daily amount of pills that you take by one (1) pill every seven (7) days. This is called a "slow downward taper" of your dose. For example: if you normally take four (4) pills per day, you will be asked to drop this dose to three (3) pills per day for seven (7) days, then to two (2) pills per day for seven (7) days, then to one (1) per day for seven (7) days, and at the end of those last seven (7) days, this is when the "Drug Holiday" would start.   Will I have withdrawals? By doing a "slow downward taper" like this one, it  is unlikely that you will experience any significant withdrawal symptoms. Typically, what triggers withdrawals is the sudden stop of a high dose opioid therapy. Withdrawals can usually be avoided by slowly decreasing the dose over a prolonged period of time. If you do not follow these instructions and decide to stop your medication abruptly, withdrawals may be possible.  What are withdrawals? Withdrawals: refers to the wide range of symptoms that occur after stopping or dramatically reducing opiate drugs after heavy and prolonged use. Withdrawal symptoms do not occur to patients that use low dose opioids, or those who take the medication sporadically. Contrary to benzodiazepine (example: Valium, Xanax, etc.) or alcohol withdrawals ("Delirium Tremens"), opioid withdrawals are not lethal. Withdrawals are the physical manifestation of the body getting rid of the excess receptors.  Expected Symptoms Early symptoms of withdrawal may include: Agitation Anxiety Muscle aches Increased tearing Insomnia Runny nose Sweating Yawning  Late symptoms of withdrawal may include: Abdominal cramping Diarrhea Dilated pupils Goose bumps Nausea Vomiting  Will I experience withdrawals? Due to the slow nature of the taper, it is very unlikely that you will experience any.  What is a slow taper? Taper: refers to the gradual decrease in dose.  (Last update: 04/21/2020) ____________________________________________________________________________________________    

## 2021-05-03 ENCOUNTER — Ambulatory Visit (INDEPENDENT_AMBULATORY_CARE_PROVIDER_SITE_OTHER): Payer: Medicare Other | Admitting: Pharmacist

## 2021-05-03 DIAGNOSIS — Z8673 Personal history of transient ischemic attack (TIA), and cerebral infarction without residual deficits: Secondary | ICD-10-CM

## 2021-05-03 DIAGNOSIS — I739 Peripheral vascular disease, unspecified: Secondary | ICD-10-CM

## 2021-05-03 DIAGNOSIS — I25810 Atherosclerosis of coronary artery bypass graft(s) without angina pectoris: Secondary | ICD-10-CM

## 2021-05-03 DIAGNOSIS — G894 Chronic pain syndrome: Secondary | ICD-10-CM

## 2021-05-03 DIAGNOSIS — I480 Paroxysmal atrial fibrillation: Secondary | ICD-10-CM

## 2021-05-03 NOTE — Patient Instructions (Signed)
Visit Information  PATIENT GOALS:  Goals Addressed               This Visit's Progress     Patient Stated     Medication Monitoring (pt-stated)        Patient Goals/Self-Care Activities Over the next 90 days, patient will:  - take medications as prescribed check blood pressure periodically, document, and provide at future appointments collaborate with provider on medication access solutions         Patient verbalizes understanding of instructions provided today and agrees to view in MyChart.   Plan: Telephone follow up appointment with care management team member scheduled for:  12 weeks  [name] Feliz Beam, PharmD, [place], CPP Clinical Pharmacist [employer] [facility] [phone]

## 2021-05-03 NOTE — Chronic Care Management (AMB) (Signed)
Chronic Care Management Pharmacy Note  05/03/2021 Name:  Chihiro Frey MRN:  [id number] DOB:  01-12-1941  Subjective: Carmen [name] is an 80 y.o. year old female who is a primary patient of [name], Kelby Aline, FNP.  The CCM team was consulted for assistance with disease management and care coordination needs.    Engaged with patient's daughter, Darnelle Catalan (on [place]) by telephone for follow up visit in response to provider referral for pharmacy case management and/or care coordination services.   Consent to Services:  The patient was given information about Chronic Care Management services, agreed to services, and gave verbal consent prior to initiation of services.  Please see initial visit note for detailed documentation.   Patient Care Team: Doreen Beam, FNP as PCP - General (Family Medicine) End, Harrell Gave, MD as PCP - Cardiology (Cardiology) Patient, No Pcp Per (Inactive) (General Practice) De Hollingshead, RPH-CPP (Pharmacist) Leona Singleton, RN as Case Manager  Recent consult visits: 6/13- [facility] 6/23 - [facility], continue current regimen 8/1 - [facility] - Vit D, hydrocodone/apap 5/235 mg BID PRN  Objective:  Lab Results  Component Value Date   CREATININE 0.86 03/14/2021   CREATININE 0.88 11/22/2020   CREATININE 0.79 10/08/2020    Lab Results  Component Value Date   HGBA1C 5.8 (H) 10/08/2020   Last diabetic Eye exam: No results found for: HMDIABEYEEXA  Last diabetic Foot exam: No results found for: HMDIABFOOTEX   No results found for: CHOL, TRIG, HDL, CHOLHDL, VLDL, LDLCALC, LDLDIRECT  Hepatic Function Latest Ref Rng & Units 03/14/2021 11/22/2020 10/08/2020  Total Protein 6.5 - 8.1 g/dL 7.9 6.8 6.4(L)  Albumin 3.5 - 5.0 g/dL 4.9 4.3 3.3(L)  AST 15 - 41 U/L 23 15 19   ALT 0 - 44 U/L 19 10 14   Alk Phosphatase 38 - 126 U/L 60 65 68  Total Bilirubin 0.3 - 1.2 mg/dL 0.7 0.3 1.2  Bilirubin, Direct 0.0 -  0.2 mg/dL - - -    Lab Results  Component Value Date/Time   TSH 1.270 11/22/2020 09:44 AM   TSH 10.765 (H) 10/06/2020 04:51 PM    CBC Latest Ref Rng & Units 11/22/2020 10/08/2020 10/07/2020  WBC 3.4 - 10.8 x10E3/uL 6.2 9.8 9.3  Hemoglobin 11.1 - 15.9 g/dL 11.8 10.8(L) 10.8(L)  Hematocrit 34.0 - 46.6 % 36.6 33.1(L) 33.5(L)  Platelets 150 - 450 x10E3/uL 267 264 256    Clinical ASCVD: Yes  The ASCVD Risk score Mikey Bussing DC Jr., et al., 2013) failed to calculate for the following reasons:   The patient has a prior MI or stroke diagnosis     CHA2DS2-VASc Score = 7  This indicates a 11.2% annual risk of stroke. The patient's score is based upon: CHF History: No HTN History: Yes Diabetes History: No Stroke History: Yes Vascular Disease History: Yes Age Score: 2 Gender Score: 1    Social History   Tobacco Use  Smoking Status Former   Types: Cigarettes   Quit date: 05/2020   Years since quitting: 1.0  Smokeless Tobacco Never  Tobacco Comments   Smoked about 1 pack per month   BP Readings from Last 3 Encounters:  05/02/21 (!) 140/102  03/24/21 128/78  03/14/21 117/72   Pulse Readings from Last 3 Encounters:  05/02/21 68  03/24/21 86  03/14/21 89   Wt Readings from Last 3 Encounters:  05/02/21 181 lb (82.1 kg)  03/24/21 181 lb 8 oz (82.3 kg)  03/14/21 181 lb (  82.1 kg)    Assessment: Review of patient past medical history, allergies, medications, health status, including review of consultants reports, laboratory and other test data, was performed as part of comprehensive evaluation and provision of chronic care management services.   SDOH:  (Social Determinants of Health) assessments and interventions performed:  SDOH Interventions    Flowsheet Row Most Recent Value  SDOH Interventions   Financial Strain Interventions Intervention Not Indicated       CCM Care Plan  No Known Allergies  Medications Reviewed Today     Reviewed by De Hollingshead, RPH-CPP  (Pharmacist) on 05/03/21 at [phone]  Med List Status: <None>   Medication Order Taking? Sig Documenting Provider Last Dose Status Informant  amLODipine-benazepril (LOTREL) 5-10 MG capsule [id number] Yes Take 1 capsule by mouth daily. End, Harrell Gave, MD Taking Active   apixaban (ELIQUIS) 5 MG TABS tablet [id number] Yes Take 1 tablet (5 mg total) by mouth 2 (two) times daily. End, Harrell Gave, MD Taking Active   aspirin EC 81 MG tablet [id number] Yes Take 81 mg by mouth daily. Swallow whole. [provider] Taking Active   atorvastatin (LIPITOR) 20 MG tablet [id number] Yes Take 1 tablet (20 mg total) by mouth at bedtime. Rise Mu, PA-C Taking Active   calcium carbonate (OSCAL) 1500 (600 Ca) MG TABS tablet [id number] Yes Take 1 tablet (1,500 mg total) by mouth 2 (two) times daily with a meal. Milinda Pointer, MD Taking Active   cetirizine (ZYRTEC) 10 MG tablet [id number] Yes Take 10 mg by mouth daily. [provider] Taking Active   Cholecalciferol (VITAMIN D3) 125 MCG (5000 UT) CAPS [id number] Yes Take 1 capsule (5,000 Units total) by mouth daily with breakfast. Take along with calcium and magnesium. Milinda Pointer, MD Taking Active   clonazePAM [facility]) 0.5 MG tablet [id number] Yes Take 1 tablet (0.5 mg total) by mouth daily as needed for anxiety. McLean-Scocuzza, Nino Glow, MD Taking Active   diazepam (VALIUM) 5 MG tablet [id number] No Take 1 tablet (5 mg total) by mouth 60 (sixty) minutes before procedure for 2 doses.  Patient not taking: Reported on 05/03/2021   Milinda Pointer, MD Not Taking Active   ergocalciferol (VITAMIN D2) 1.25 MG (50000 UT) capsule [id number] Yes Take 1 capsule (50,000 Units total) by mouth 2 (two) times a week. X 6 weeks. Milinda Pointer, MD Taking Active   gabapentin (NEURONTIN) 100 MG capsule [id number] No Take 200 mg by mouth 2 (two) times daily.  Patient not taking: Reported on 05/03/2021   [provider] Not Taking Active    HYDROcodone-acetaminophen (NORCO/VICODIN) 5-325 MG tablet [id number] Yes Take 1 tablet by mouth 2 (two) times daily as needed for severe pain. Must last 30 days. Milinda Pointer, MD Taking Active            Med Note Dossie Arbour, [place] A   Mon May 02, 2021  1:22 PM) WARNING: Not a Duplicate. Future prescription. Do NOT Delete!! [facility] Patient   levothyroxine (SYNTHROID) 100 MCG tablet [id number] Yes Take 100 mcg by mouth daily before breakfast. [provider] Taking Active   metoprolol succinate (TOPROL-XL) 100 MG 24 hr tablet [id number] Yes Take 50 mg by mouth daily. [provider] Taking Active            Med Note Kelby Aline Feb 23, 2021  9:52 AM)    nystatin-triamcinolone ointment [facility]) [id number] Yes Apply 1 application topically 2 (two) times daily. Prn not for face,  private area, underarms McLean-Scocuzza, Nino Glow, MD Taking Active   traZODone (DESYREL) 50 MG tablet [id number] Yes Take 0.5 tablets (25 mg total) by mouth at bedtime as needed for sleep. [name], Kelby Aline, FNP Taking Active            Med Note De Hollingshead   Tue May 03, 2021  9:12 AM) 50 mg QPM  vitamin B-12 (CYANOCOBALAMIN) 500 MCG tablet [id number] Yes Take 500 mcg by mouth every Monday, Wednesday, and Friday. [provider] Taking Active            Med Note Meyer Russel Nov 24, 2020 11:51 AM)    Med List Note Hart Rochester, RN 03/14/21 1128): New patient UDS 03/14/2021            Patient Active Problem List   Diagnosis Date Noted   Chronic cervical radiculopathy (Bilateral) 05/02/2021   DDD (degenerative disc disease), cervical 05/02/2021   Foraminal stenosis of cervical region 05/02/2021   Vitamin D deficiency 04/30/2021   Osteopenia determined by x-ray 04/30/2021   Lumbar compression fracture ([place]) (Multilevel), sequela 04/30/2021   Chronic pain syndrome 03/14/2021   Pharmacologic therapy 03/14/2021   Disorder  of skeletal system 03/14/2021   Problems influencing health status 03/14/2021   Cervicalgia 03/14/2021   Chronic neck pain (2ry area of Pain) (Bilateral) (L>R) 03/14/2021   Chronic lower extremity pain (3ry area of Pain) (Bilateral) (L>R) 03/14/2021   Chronic thigh pain (Bilateral) 03/14/2021   Chronic hand pain (Left) 03/14/2021   Chronic hand pain (Right) 03/14/2021   Chronic low back pain (1ry area of Pain) (Bilateral) (L>R) w/o sciatica 02/17/2021   Difficulty sleeping 02/17/2021   Dry skin dermatitis 02/03/2021   At risk for falls 02/03/2021   Athlete's foot on right 02/03/2021   Essential hypertension 11/26/2020   Falls frequently 11/18/2020   Paroxysmal atrial fibrillation ([place]) 11/18/2020   Repeated falls 11/18/2020   Acute encephalopathy 10/06/2020   CVA (cerebral vascular accident) ([place]) 10/06/2020   Atrial flutter ([place]) 10/06/2020   Peripheral vascular disease ([place]) 10/06/2020   Depression, unspecified 10/02/2020   Acquired hypothyroidism 10/02/2020   Athscl heart disease 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  Conditions to be addressed/monitored: Atrial Fibrillation, CAD, HTN, HLD, Anxiety, and chronic pain  Care Plan : PharmD - Medication Management  Updates made by De Hollingshead, RPH-CPP since 05/03/2021 12:00 AM     Problem: CAD, Afib, Chronic Pain, Depression      Long-Range  Goal: Disease Progression Prevention   Start Date: 02/23/2021  This Visit's Progress: On track  Recent Progress: On track  Priority: High  Note:   Current Barriers:  Unable to independently monitor therapeutic efficacy Suboptimal therapeutic regimen for chronic pain, mental health  Pharmacist Clinical Goal(s):  Over the next 90 days, patient will achieve adherence to monitoring guidelines and medication adherence to achieve therapeutic efficacy through collaboration with PharmD and  provider.    Interventions: 1:1 collaboration with [name], Kelby Aline, FNP regarding development and update of comprehensive plan of care as evidenced by provider attestation and co-signature Inter-disciplinary care team collaboration (see longitudinal plan of care) Comprehensive medication review performed; medication list updated in electronic medical record    Depression/Anxiety with insomnia Improved per daughter's report; current treatment: trazodone 25 mg QPM - though patient increased to 50 mg QPM; clonazepam 0.5 mg QAM PRN - taking less frequently recently per daughter's report Previously declined need for LCSW support.  Moving forward, could consider initiation of duloxetine for mood, chronic pain.  Chronic Pain: Uncontrolled and suboptimally managed; current regimen: hydrocodone/acetaminophen 5/325 mg BID per Pain Management; gabapentin 200 mg BID prescribed by neurology, however, daughter reports that patient has taken for a month and desires to stop. Denies any pain benefit from the medication, but also reports a "skin crawling" sensation when she takes it that causes distress. Reports f/u with neurology next week. Advised to discuss this side effect with neurology and consider alternative non-opioid medication, such as duloxetine. Reports upcoming imaging per pain management to further evaluate etiology of pain. Diazepam script given for use prior to imaging. Advised daughter to avoid use of  clonazepam within 8-12 hours of diazepam. She verbalized understanding Continue collaboration with neurology, pain management.   Hyperlipidemia and secondary ASCVD prevention: Unsure, no lipid panel on file; current treatment: atorvastatin 20 mg daily Antiplatelet regimen: aspirin 81 mg daily (dual antiplatelet + anticoagulant recommended by cardiology given extensive CV hx) Recommend lipid panel check moving forward, recommend goal LDL <70 given ASCVD hx. Discussed with daughter to ensure lipids are checked with next comprehensive lab work. Continue to monitor for risks of falls that would result in bleeds, given dual ASA + anticoagulant.   Atrial Fibrillation, hx CVA: Appropriately managed; current rate control: metoprolol succinate 50 mg daily (splitting 100 mg tab); anticoagulant treatment: Eliquis 5 mg BID (does not meet criteria for dose reduction); additional antihypertensive: amlodipine/benazepril 5/10 mg daily Home BP readings have been well controlled 100-120s/60-70s.  Reports a fall about a month ago. Syncopal episode. Evaluated by EMS and they recommended hospital evaluation, but patient declined. Daughter now wonders if it was a mini stroke, as she has noticed some mild behavioral changes since then. Plans to discuss with neurology next week. Daughter also states that moving forward, if she has any falls that are not mechanical, she will ensure patient is evaluated in the hospital  Continue current regimen at this time along with cardiology, neurology collaboration. Agreed with daughter's plan about evaluation of falls moving forward  Hypothyroidism: Controlled per last lab; currnet regimen: levothyroxine 100 mcg QAM Confirmed appropriate administration. Advised to be sure to separate from calcium supplement.  Continue current regimen at this time  Hand Peeling: Moderately well managed per daughter report; declined derm consult: ketoconazole 2% cream PRN for hand break out, though  believes also related to anxiety.  Continue current regimen at this time  Supplements: Vitamin B12 500 units daily, Vit D 50,000 units weekly + Vit D 2000 units daily, calcium 600 mg BID per pain management. Daughter plans to pick these up today.   Patient Goals/Self-Care Activities Over the next 90 days, patient will:  - take medications as prescribed check blood pressure periodically, document, and provide at future appointments collaborate with provider on medication access solutions  Follow Up Plan: Telephone follow up appointment with care management team member scheduled for: ~ 12 weeks  Medication Assistance: None required.  Patient affirms current coverage meets needs.  Patient's preferred pharmacy is:  [id number] [place] [place]Phone: [phone]: [phone] [place] [place]Phone: [phone]: [phone] Follow Up:  Patient agrees to Care Plan and Follow-up.  Plan: Telephone follow up appointment with care management team member scheduled for:  12 weeks  [name] TDarnelle Maffucci PharmD, BOwat[place] CKilaClinical Pharmacist LOccidental Petroleumat BJohnson & [phone]

## 2021-05-04 ENCOUNTER — Ambulatory Visit: Payer: Medicare Other | Admitting: *Deleted

## 2021-05-04 DIAGNOSIS — R296 Repeated falls: Secondary | ICD-10-CM

## 2021-05-04 DIAGNOSIS — G894 Chronic pain syndrome: Secondary | ICD-10-CM

## 2021-05-04 DIAGNOSIS — I480 Paroxysmal atrial fibrillation: Secondary | ICD-10-CM

## 2021-05-04 DIAGNOSIS — I25810 Atherosclerosis of coronary artery bypass graft(s) without angina pectoris: Secondary | ICD-10-CM

## 2021-05-04 DIAGNOSIS — G8929 Other chronic pain: Secondary | ICD-10-CM

## 2021-05-04 NOTE — Patient Instructions (Signed)
Visit Information  PATIENT GOALS:  Goals Addressed             This Visit's Progress    (RNCM) Cope with Chronic Pain   On track    Timeframe:  Long-Range Goal Priority:  Medium Start Date:  02/25/21                           Expected End Date:  08/31/21                     Follow Up Date 06/29/21    Learn relaxation techniques and use during pain Spend time with positive people; eat more in dining room Tell myself I can (not I can't) Think of new ways to do favorite things Use distraction techniques Continue to attend appointments with pain management provider Engage in more activities at [facility]     Why is this important?   Stress makes chronic pain feel worse.  Feelings like depression, anxiety, stress and anger can make your body more sensitive to pain.  Learning ways to cope with stress or depression may help you find some relief from the pain.     Notes:      (RNCM) Prevent Falls and Injury   On track    Timeframe:  Long-Range Goal Priority:  Medium Start Date:  02/25/21                           Expected End Date:  08/31/21                     Follow Up Date 06/29/21    Always wear low-heeled or flat shoes or slippers with nonskid soles Keep my cell phone and call light with me always Learn how to get back up if I fall Make an emergency alert plan in case I fall Pick up clutter from the floors and use a nonslip pad with throw rugs, or remove them completely Use walker  with all ambulation Use a nightlight in the bathroom Continue to work with physical therapy    Why is this important?   Most falls happen when it is hard for you to walk safely. Your balance may be off because of an illness. You may have pain in your knees, hip or other joints.  You may be overly tired or taking medicines that make you sleepy. You may not be able to see or hear clearly.  Falls can lead to broken bones, bruises or other injuries.  There are things you can do to help  prevent falling.     Notes:         Patient verbalizes understanding of instructions provided today and agrees to view in MyChart.   The care management team will reach out to the patient again over the next 60 business days.   Rhae Lerner RN, MSN RN Care Management Coordinator [facility] [phone] [name]

## 2021-05-04 NOTE — Chronic Care Management (AMB) (Signed)
Chronic Care Management   CCM RN Visit Note  05/04/2021 Name: Carmen [name] MRN: [id number] DOB: 06-22-1941  Subjective: Carmen [name] is a 80 y.o. year old female who is a primary care patient of [name], [name], FNP. The care management team was consulted for assistance with disease management and care coordination needs.    Engaged with patient by telephone for follow up visit in response to provider referral for case management and/or care coordination services.   Consent to Services:  The patient was given information about Chronic Care Management services, agreed to services, and gave verbal consent prior to initiation of services.  Please see initial visit note for detailed documentation.   Patient agreed to services and verbal consent obtained.   Assessment: Review of patient past medical history, allergies, medications, health status, including review of consultants reports, laboratory and other test data, was performed as part of comprehensive evaluation and provision of chronic care management services.   SDOH (Social Determinants of Health) assessments and interventions performed:    CCM Care Plan  No Known Allergies  Outpatient Encounter Medications as of 05/04/2021  Medication Sig Note   HYDROcodone-acetaminophen (NORCO/VICODIN) 5-325 MG tablet Take 1 tablet by mouth 2 (two) times daily as needed for severe pain. Must last 30 days. 05/02/2021: WARNING: Not a Duplicate. Future prescription. Do NOT Delete!! [facility] Patient    amLODipine-benazepril (LOTREL) 5-10 MG capsule Take 1 capsule by mouth daily.    apixaban (ELIQUIS) 5 MG TABS tablet Take 1 tablet (5 mg total) by mouth 2 (two) times daily.    aspirin EC 81 MG tablet Take 81 mg by mouth daily. Swallow whole.    atorvastatin (LIPITOR) 20 MG tablet Take 1 tablet (20 mg total) by mouth at bedtime.    calcium carbonate (OSCAL) 1500 (600 Ca) MG TABS tablet Take 1 tablet (1,500 mg total) by mouth 2 (two)  times daily with a meal.    cetirizine (ZYRTEC) 10 MG tablet Take 10 mg by mouth daily.    Cholecalciferol (VITAMIN D3) 125 MCG (5000 UT) CAPS Take 1 capsule (5,000 Units total) by mouth daily with breakfast. Take along with calcium and magnesium.    clonazePAM (KLONOPIN) 0.5 MG tablet Take 1 tablet (0.5 mg total) by mouth daily as needed for anxiety.    diazepam (VALIUM) 5 MG tablet Take 1 tablet (5 mg total) by mouth 60 (sixty) minutes before procedure for 2 doses. (Patient not taking: Reported on 05/03/2021)    ergocalciferol (VITAMIN D2) 1.25 MG (50000 UT) capsule Take 1 capsule (50,000 Units total) by mouth 2 (two) times a week. X 6 weeks.    gabapentin (NEURONTIN) 100 MG capsule Take 200 mg by mouth 2 (two) times daily. (Patient not taking: Reported on 05/03/2021)    levothyroxine (SYNTHROID) 100 MCG tablet Take 100 mcg by mouth daily before breakfast.    metoprolol succinate (TOPROL-XL) 100 MG 24 hr tablet Take 50 mg by mouth daily.    nystatin-triamcinolone ointment (MYCOLOG) Apply 1 application topically 2 (two) times daily. Prn not for face, private area, underarms    traZODone (DESYREL) 50 MG tablet Take 0.5 tablets (25 mg total) by mouth at bedtime as needed for sleep. 05/03/2021: 50 mg QPM   vitamin B-12 (CYANOCOBALAMIN) 500 MCG tablet Take 500 mcg by mouth every Monday, Wednesday, and Friday.    No facility-administered encounter medications on file as of 05/04/2021.    Patient Active Problem List   Diagnosis Date Noted   Chronic  cervical radiculopathy (Bilateral) 05/02/2021   DDD (degenerative disc disease), cervical 05/02/2021   Foraminal stenosis of cervical region 05/02/2021   Vitamin D deficiency 04/30/2021   Osteopenia determined by x-ray 04/30/2021   Lumbar compression fracture (Old) (Multilevel), sequela 04/30/2021   Chronic pain syndrome 03/14/2021   Pharmacologic therapy 03/14/2021   Disorder of skeletal system 03/14/2021   Problems influencing health status 03/14/2021    Cervicalgia 03/14/2021   Chronic neck pain (2ry area of Pain) (Bilateral) (L>R) 03/14/2021   Chronic lower extremity pain (3ry area of Pain) (Bilateral) (L>R) 03/14/2021   Chronic thigh pain (Bilateral) 03/14/2021   Chronic hand pain (Left) 03/14/2021   Chronic hand pain (Right) 03/14/2021   Chronic low back pain (1ry area of Pain) (Bilateral) (L>R) w/o sciatica 02/17/2021   Difficulty sleeping 02/17/2021   Dry skin dermatitis 02/03/2021   At risk for falls 02/03/2021   Athlete's foot on right 02/03/2021   Essential hypertension 11/26/2020   Falls frequently 11/18/2020   Paroxysmal atrial fibrillation (HCC) 11/18/2020   Repeated falls 11/18/2020   Acute encephalopathy 10/06/2020   CVA (cerebral vascular accident) (HCC) 10/06/2020   Atrial flutter (HCC) 10/06/2020   Peripheral vascular disease (HCC) 10/06/2020   Depression, unspecified 10/02/2020   Acquired hypothyroidism 10/02/2020   Athscl heart disease of native coronary artery w/o ang pctrs 10/02/2020   Vitamin B deficiency, unspecified 10/02/2020   Other chronic pain 10/02/2020   Hyperlipidemia, unspecified 10/02/2020   Personal history of nicotine dependence 10/02/2020   Prsnl hx of TIA (TIA), and cereb infrc w/o resid deficits 10/02/2020   Anemia, unspecified 10/02/2020   Dorsalgia, unspecified 10/02/2020   History of stroke 10/02/2020   Chronic anticoagulation (Eliquis) 10/02/2020   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   Presence of coronary angioplasty implant and graft 10/02/2020    Conditions to be addressed/monitored: Pain and Falls  Care Plan : Fall Risk (Adult)  Updates made by Maple Mirzaarpley, [place] Kappes D, RN since 05/04/2021 12:00 AM     Problem: Fall Risk   Priority: Medium     Long-Range Goal: Absence of Fall and Fall-Related Injury   Start Date: 02/25/2021  Expected End Date: 11/29/2021  This Visit's Progress: Not on track  Recent Progress: On track  Priority: Medium  Note:   Current Barriers:   Knowledge Deficits related to fall precautions in patient with greater than 20 falls in the last year per daughter.  Denies actual injuries with falls but lots of bruising.  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  [facility] in [facility] and was receiving home health physical therapy (put on hold due to COVID cases at facility).  Per daughter, patient continues to adjust well and becoming more comfortable at facility.  Patient has remained COVID free per daughter.  Using Rolator walker to ambulate; continues to be very active with her ambulation.  Daughter reports one fall in the last 2 months.  Attempted to contact patient, she did not answer. Decreased adherence to prescribed treatment for fall prevention Clinical Goal(s):  patient will demonstrate improved adherence to prescribed treatment plan for decreasing falls as evidenced by patient reporting and review of EMR patient will verbalize using fall risk reduction strategies discussed patient will not experience additional falls patient will verbalize understanding of plan for fall reduction Interventions:  Collaboration with [name], Eula FriedMichelle S, FNP regarding development and update of comprehensive plan of care as evidenced by provider attestation and co-signature Inter-disciplinary care team  collaboration (see longitudinal plan of care) Provided written and verbal education re: Potential causes of falls and Fall prevention strategies Reviewed medications and discussed potential side effects of medications such as dizziness and frequent urination Assessed for falls since last encounter. Assessed patients daughter's knowledge of fall risk prevention secondary to previously provided education. Assessed working status of patient call light system and patient adherence Encouraged continued participation in home health physical therapy Evaluation of current  treatment plan related to falls and patient's adherence to plan as established by provider. Activities of daily living skills assessed Assistive or adaptive device use encouraged; encouraged to use Rolator walker at all times Barriers to physical activity or exercise identified and addressed; Barriers to safety identified Cognitive-stimulating activities promoted; encouraged to participate in group activities at facility as able Fall prevention plan reviewed and updated Fear of falling, loss of independence and pain acknowledged Medication list reviewed and compliance encouraged Patient Goals/Self-Care Deficits:  Always wear low-heeled or flat shoes or slippers with nonskid soles Keep my cell phone and call light with me always Learn how to get back up if I fall Make an emergency alert plan in case I fall Pick up clutter from the floors and use a nonslip pad with throw rugs, or remove them completely Use walker  with all ambulation Use a nightlight in the bathroom Continue to work with physical therapy  Follow Up Plan: The care management team will reach out to the patient again over the next 60 business days.      Care Plan : Chronic Pain (Adult)  Updates made by Maple Mirza, RN since 05/04/2021 12:00 AM     Problem: Chronic Pain Management (Chronic Pain)   Priority: Medium     Long-Range Goal: Chronic Pain Managed   Start Date: 02/25/2021  Expected End Date: 11/29/2021  This Visit's Progress: On track  Recent Progress: On track  Priority: Medium  Note:   Current Barriers:  Knowledge Deficits related to self-health management of chronic pain; daughter reports patient with chronic back pain and hand pain.  Reports taking pain medication and anxiety medication as prescribed.  Daughter reports patient doing better; feels pain and anxiety/mood is controlled and she feels patient is beginning to enjoy herself at facility.  Has scheduled testing next week prior to seeing provider.   Daughter does report rash to patients hands, using previously prescribed cream Chronic Disease Management support and education needs related to chronic pain Lacks social connections Clinical Goal(s):  patient will verbalize understanding of plan for pain management. , patient will use pharmacological and nonpharmacological pain relief strategies as prescribed. , and patient will verbalize acceptable level of pain relief and ability to engage in desired activities Interventions:  Collaboration with [name], [name], FNP regarding development and update of comprehensive plan of care as evidenced by provider attestation and co-signature Pain assessment performed Medications reviewed Discussed plans with patient for ongoing care management follow up and provided patient with direct contact information for care management team Evaluation of current treatment plan related to pain and depression and patient's adherence to plan as established by provider. Social Work referral for mood and depression Discussed plans with patient for ongoing care management follow up and provided patient with direct contact information for care management team Careful application of heat or ice encouraged Deep breathing, relaxation and mindfulness use promoted Effectiveness of pharmacologic therapy monitored Motivation and barriers to change assessed and addressed Participation in physical therapy encouraged and premedication prior to activity encouraged  Attempted to determine if pain is associated with mobility or at rest, location, intensity, frequency, duration, recurrence, pattern and description (e.g., cramping, burning, aching), triggers and relieving factors Emotional support and empathy provided to daughter Encouraged increase in engagement in activities at [facility] and also eating meals in dining room to help engagement with other peers as facility will allow Encouraged to attend scheduled MRI and CT  scan Encouraged daughter to notify provider if hand rash worsens or does not resolve Patient Goals/Self Care Activities:  Learn relaxation techniques and use during pain Spend time with positive people; eat more in dining room Tell myself I can (not I can't) Think of new ways to do favorite things Use distraction techniques Continue to attend appointments with pain management provider [facility]age in more [facility] at [facility] Follow Up Plan: The care management team will reach out to the patient again over the next 60 business days.      Plan:The care management team will reach out to the patient again over the next 60 business days.  Rhae Lerner RN, MSN RN Care Management Coordinator [facility] [phone] [email or web address]

## 2021-05-10 ENCOUNTER — Ambulatory Visit: Payer: Medicare Other | Admitting: Adult Health

## 2021-05-11 ENCOUNTER — Ambulatory Visit
Admission: RE | Admit: 2021-05-11 | Discharge: 2021-05-11 | Disposition: A | Discharge status: Home / Self Care | Payer: Medicare Other | Source: Ambulatory Visit | Attending: Pain Medicine | Admitting: Pain Medicine

## 2021-05-11 ENCOUNTER — Other Ambulatory Visit: Payer: Self-pay

## 2021-05-11 DIAGNOSIS — M5136 Other intervertebral disc degeneration, lumbar region: Secondary | ICD-10-CM

## 2021-05-11 DIAGNOSIS — M4802 Spinal stenosis, cervical region: Secondary | ICD-10-CM | POA: Diagnosis present

## 2021-05-11 DIAGNOSIS — M542 Cervicalgia: Secondary | ICD-10-CM | POA: Insufficient documentation

## 2021-05-11 DIAGNOSIS — M545 Low back pain, unspecified: Secondary | ICD-10-CM

## 2021-05-11 DIAGNOSIS — M5030 Other cervical disc degeneration, unspecified cervical region: Secondary | ICD-10-CM | POA: Insufficient documentation

## 2021-05-11 DIAGNOSIS — S32000S Wedge compression fracture of unspecified lumbar vertebra, sequela: Secondary | ICD-10-CM | POA: Insufficient documentation

## 2021-05-11 DIAGNOSIS — G894 Chronic pain syndrome: Secondary | ICD-10-CM | POA: Insufficient documentation

## 2021-05-11 DIAGNOSIS — G8929 Other chronic pain: Secondary | ICD-10-CM | POA: Insufficient documentation

## 2021-05-11 DIAGNOSIS — M5412 Radiculopathy, cervical region: Secondary | ICD-10-CM | POA: Diagnosis present

## 2021-05-12 ENCOUNTER — Other Ambulatory Visit: Payer: Self-pay | Admitting: Internal Medicine

## 2021-05-15 NOTE — Progress Notes (Signed)
PROVIDER NOTE: Information contained herein reflects review and annotations entered in association with encounter. Interpretation of such information and data should be left to medically-trained personnel. Information provided to patient can be located elsewhere in the medical record under "Patient Instructions". Document created using STT-dictation technology, any transcriptional errors that may result from process are unintentional.    Patient: Carmen [name]  Service Category: E/M  Provider: Gaspar Cola, MD  DOB: 1941-05-19  DOS: 05/17/2021  Specialty: Interventional Pain Management  MRN: [id number]  Setting: Ambulatory outpatient  PCP: Doreen Beam, FNP  Type: Established Patient    Referring Provider: Sharmon Leyden*  Location: Office  Delivery: Face-to-face     HPI  Carmen [name], a 80 y.o. year old female, is here today because of her Chronic pain syndrome [G89.4]. Ms. Mccartney primary complain today is Back Pain (low) and Neck Pain (left) Last encounter: My last encounter with her was on 05/02/2021. Pertinent problems: Ms. Biondo has Peripheral vascular disease ([place]); Other chronic pain; Chronic low back pain (1ry area of Pain) (Bilateral) (R>L) w/o sciatica; Dorsalgia, unspecified; Repeated falls; Chronic pain syndrome; Cervicalgia; Chronic neck pain (2ry area of Pain) (Bilateral) (L>R); Chronic lower extremity pain (3ry area of Pain) (Bilateral) (L>R); Chronic thigh pain (Bilateral); Chronic hand pain (Left); Chronic hand pain (Right); Lumbar compression fracture (Old) (Multilevel), sequela; Chronic cervical radiculopathy (Bilateral); DDD (degenerative disc disease), cervical; Foraminal stenosis of cervical region; Lumbar facet joint syndrome (Bilateral); Lumbosacral facet hypertrophy (Multilevel) (Bilateral); Lumbar facet arthropathy (Multilevel) (Bilateral); 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 Pain Assessment: Severity of Chronic pain is reported as a 4 /10. Location: Back Lower/radiates down the back of both legs to ankles. Onset: More than a month ago. Quality: Aching, Burning, Throbbing, Stabbing. Timing: Constant. Modifying factor(s): heat, rest. Vitals:  height is 5' 1"  (1.549 m) and weight is 181 lb (82.1 kg). Her temporal temperature is 96.8 F (36 C) (abnormal). Her blood pressure is 140/92 (abnormal) and her pulse is 81. Her respiration is 16 and oxygen saturation is 100%.   Reason for encounter: medication management.   The patient indicates doing well with the current medication regimen. No adverse reactions or side effects reported to the medications.   Today we went over the results of her cervical MRI and lumbar CT scan.  The patient has evidence of facet hypertrophy affecting the cervical as well as the lumbar region.  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  She also has some osteoarthritis affecting the sacroiliac joints.  Today I went over those results explaining the findings and possible associated symptoms, level by level.  I have provided them with copies of both studies.  After having reviewed her current symptoms and results of the MRI and CT scan we have decided to go ahead and schedule her to return for a diagnostic bilateral lumbar facet block under fluoroscopic guidance and IV sedation.  The patient is on Eliquis anticoagulation and therefore we will need to get clearance from her physician first.  If clearance is granted, we will have the patient stop the Eliquis for 3 days prior to the procedure and we  will have  her restarted 6 hours after we have completed the procedure.  This initial bilateral lumbar facet block will be a diagnostic one looking at the possibility of radiofrequency ablation.  RTCB: 08/30/2021  Pharmacotherapy Assessment  Analgesic: Hydrocodone/APAP 5/325, 1 tab p.o. BID (10 mg/day of hydrocodone) (10 MME) (last filled on 05/02/2021) MME/day: 10 mg/day   Monitoring: [place] PMP: PDMP reviewed during this encounter.       Pharmacotherapy: No side-effects or adverse reactions reported. Compliance: No problems identified. Effectiveness: Clinically acceptable.  Dewayne Shorter, RN  05/17/2021  3:05 PM  Sign when Signing Visit Nursing Pain Medication Assessment:  Safety precautions to be maintained throughout the outpatient stay will include: orient to surroundings, keep bed in low position, maintain call bell within reach at all times, provide assistance with transfer out of bed and ambulation.  Medication Inspection Compliance: Pill count conducted under aseptic conditions, in front of the patient. Neither the pills nor the bottle was removed from the patient's sight at any time. Once count was completed pills were immediately returned to the patient in their original bottle.  Medication: Hydrocodone/APAP Pill/Patch Count:  13 of 60 pills remain Pill/Patch Appearance: Markings consistent with prescribed medication Bottle Appearance: Standard pharmacy container. Clearly labeled. Filled Date: 08 / 01 / 2022 Last Medication intake:  Today    UDS:  Summary  Date Value Ref Range Status  03/14/2021 Note  Final    Comment:    ==================================================================== Compliance Drug Analysis, Ur ==================================================================== Test                             Result       Flag       Units  Drug Present and Declared for Prescription Verification   Hydrocodone                    900          EXPECTED   ng/mg creat    Hydromorphone                  196          EXPECTED   ng/mg creat   Dihydrocodeine                 166          EXPECTED   ng/mg creat   Norhydrocodone                 1484         EXPECTED   ng/mg creat    Sources of hydrocodone include scheduled prescription medications.    Hydromorphone, dihydrocodeine and norhydrocodone are expected    metabolites of hydrocodone. Hydromorphone and dihydrocodeine are    also available as scheduled prescription medications.    Trazodone                      PRESENT      EXPECTED   1,3 chlorophenyl piperazine    PRESENT      EXPECTED    1,3-chlorophenyl piperazine is an expected metabolite of trazodone.    Acetaminophen                  PRESENT      EXPECTED   Metoprolol                     PRESENT      EXPECTED  Drug Present not Declared for Prescription Verification   Ephedrine/Pseudoephedrine      PRESENT      UNEXPECTED   Phenylpropanolamine            PRESENT      UNEXPECTED    Source of ephedrine/pseudoephedrine is most commonly pseudoephedrine    in over-the-counter or prescription cold and allergy medications.    Phenylpropanolamine is an expected metabolite of    ephedrine/pseudoephedrine.    Guaifenesin                    PRESENT      UNEXPECTED    Guaifenesin may be administered as an over-the-counter or    prescription drug; it may also be present as a breakdown product of    methocarbamol.  Drug Absent but Declared for Prescription Verification   Clonazepam                     Not Detected UNEXPECTED ng/mg creat   Salicylate                     Not Detected UNEXPECTED    Aspirin, as indicated in the declared medication list, is not always    detected even when used as directed.  ==================================================================== Test                      Result    Flag   Units      Ref Range   Creatinine              56               mg/dL       >=20 ==================================================================== Declared Medications:  The flagging and interpretation on this report are based on the  following declared medications.  Unexpected results may arise from  inaccuracies in the declared medications.   **Note: The testing scope of this panel includes these medications:   Clonazepam (Klonopin)  Hydrocodone (Norco)  Metoprolol (Toprol)  Trazodone (Desyrel)   **Note: The testing scope of this panel does not include small to  moderate amounts of these reported medications:   Acetaminophen (Norco)  Aspirin   **Note: The testing scope of this panel does not include the  following reported medications:   Amlodipine (Lotrel)  Apixaban (Eliquis)  Atorvastatin (Lipitor)  Benazepril (Lotrel)  Cyanocobalamin  Fluconazole (Diflucan)  Levothyroxine (Synthroid)  Nystatin  Triamcinolone ==================================================================== For clinical consultation, please call [phone]. ====================================================================      ROS  Constitutional: Denies any fever or chills Gastrointestinal: No reported hemesis, hematochezia, vomiting, or acute GI distress Musculoskeletal: Denies any acute onset joint swelling, redness, loss of ROM, or weakness Neurological: No reported episodes of acute onset apraxia, aphasia, dysarthria, agnosia, amnesia, paralysis, loss of coordination, or loss of consciousness  Medication Review  HYDROcodone-acetaminophen, Vitamin D3, amLODipine-benazepril, apixaban, aspirin EC, atorvastatin, calcium carbonate, cetirizine, clonazePAM, diazepam, ergocalciferol, gabapentin, levothyroxine, metoprolol succinate, nystatin-triamcinolone ointment, traZODone, and vitamin B-12  History Review  Allergy: Ms. Rockefeller has No Known Allergies. Drug: Ms. Greenhalgh  reports no history of drug use. Alcohol:  reports that she does not currently use  alcohol. Tobacco:  reports that she quit smoking about 12 months ago. Her smoking use included cigarettes. She has never used smokeless tobacco. Social: Ms. Mcgivern  reports that she quit smoking about 12 months ago. Her smoking use included cigarettes. She has never used smokeless tobacco. She reports that she  does not currently use alcohol. She reports that she does not use drugs. Medical:  has a past medical history of Allergy, Anemia, Anxiety, Arthritis, Back pain, Coronary artery disease, Depression, Hypertension, Peripheral vascular disease ([place]), Stroke ([place]), and Thyroid disease. Surgical: Ms. Reza  has a past surgical history that includes Cardiac catheterization; Coronary angioplasty; Coronary artery bypass graft; and back injections. Family: family history includes Bipolar disorder in her daughter; Breast cancer in her mother; Heart attack in her father.  Laboratory Chemistry Profile   Renal Lab Results  Component Value Date   BUN 24 (H) 03/14/2021   CREATININE 0.86 03/14/2021   BCR 13 11/22/2020   GFRAA 73 11/22/2020   GFRNONAA >60 03/14/2021    Hepatic Lab Results  Component Value Date   AST 23 03/14/2021   ALT 19 03/14/2021   ALBUMIN 4.9 03/14/2021   ALKPHOS 60 03/14/2021   AMMONIA 19 10/06/2020    Electrolytes Lab Results  Component Value Date   NA 135 03/14/2021   K 4.5 03/14/2021   CL 103 03/14/2021   CALCIUM 9.5 03/14/2021   MG 1.9 03/14/2021    Bone Lab Results  Component Value Date   25OHVITD1 12 (L) 03/14/2021   25OHVITD2 <1.0 03/14/2021   25OHVITD3 12 03/14/2021    Inflammation (CRP: Acute Phase) (ESR: Chronic Phase) Lab Results  Component Value Date   CRP 0.8 03/14/2021   ESRSEDRATE 4 03/14/2021         Note: Above Lab results reviewed.  Recent Imaging Review  MR CERVICAL SPINE WO CONTRAST CLINICAL DATA:  Neck pain, chronic; cervicalgia; cervical radiculopathy, chronic  EXAM: MRI CERVICAL SPINE WITHOUT CONTRAST  TECHNIQUE: Multiplanar,  multisequence MR imaging of the cervical spine was performed. No intravenous contrast was administered.  COMPARISON:  None.  FINDINGS: Motion artifact is present.  Alignment: Anteroposterior alignment is maintained.  Vertebrae: Vertebral body heights are maintained. No substantial marrow edema. No suspicious osseous lesion.  Cord: No abnormal signal.  Posterior Fossa, vertebral arteries, paraspinal tissues: Left superior cerebellar infarct. Otherwise unremarkable.  Disc levels:  C2-C3: Disc bulge with endplate osteophytes. Uncovertebral and facet hypertrophy. No canal or foraminal stenosis.  C3-C4: Disc bulge with endplate osteophytes. Uncovertebral and facet hypertrophy. Mild canal stenosis. Marked foraminal stenosis.  C4-C5: Disc bulge with endplate osteophytes. Uncovertebral and facet hypertrophy. No canal stenosis. Marked right and moderate left foraminal stenosis.  C5-C6: Disc bulge with endplate osteophytes. Uncovertebral and facet hypertrophy. Moderate canal stenosis. Marked foraminal stenosis.  C6-C7: Disc bulge with endplate osteophytes. Uncovertebral hypertrophy. No canal or foraminal stenosis.  C7-T1: Disc bulge with endplate osteophytes. No canal or foraminal stenosis.  IMPRESSION: Multilevel degenerative changes as detailed above with suboptimal stenosis evaluation due to motion degradation. No high-grade canal stenosis. Foraminal narrowing is greatest at C3-C4, C4-C5, and C5-C6.  Electronically Signed   By: Macy Mis M.D.   On: 05/12/2021 10:50 Note: Reviewed        Physical Exam  General appearance: Well nourished, well developed, and well hydrated. In no apparent acute distress Mental status: Alert, oriented x 3 (person, place, & time)       Respiratory: No evidence of acute respiratory distress Eyes: PERLA Vitals: BP (!) 140/92 (BP Location: Right Arm, Patient Position: Sitting, Cuff Size: Large)   Pulse 81   Temp (!) 96.8 F (36 C)  (Temporal)   Resp 16   Ht 5' 1"  (1.549 m)   Wt 181 lb (82.1 kg)   SpO2 100%   BMI 34.20 kg/m  BMI: Estimated body mass index is 34.2 kg/m as calculated from the following:   Height as of this encounter: 5' 1"  (1.549 m).   Weight as of this encounter: 181 lb (82.1 kg). Ideal: Ideal body weight: 47.8 kg (105 lb 6.1 oz) Adjusted ideal body weight: 61.5 kg (135 lb 10 oz)  Assessment   Status Diagnosis  Controlled Controlled Controlled 1. Chronic pain syndrome   2. Chronic low back pain (1ry area of Pain) (Bilateral) (L>R) w/o sciatica   3. Lumbar facet joint syndrome (Bilateral)   4. Lumbar facet arthropathy (Multilevel) (Bilateral)   5. Lumbosacral facet hypertrophy (Multilevel) (Bilateral)   6. Other intervertebral disc degeneration, lumbar region   7. 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 Lumbar compression fracture (Old) (Multilevel), sequela   9. Spondylosis without myelopathy or radiculopathy, lumbosacral region   10. Chronic neck pain (2ry area of Pain) (Bilateral) (L>R)   11. Cervical facet syndrome (Bilateral)   12. Cervical facet hypertrophy (Multilevel) (Bilateral)   13. Spondylosis without myelopathy or radiculopathy, cervical region   14. Chronic lower extremity pain (3ry area of Pain) (Bilateral) (L>R)   15. Osteopenia determined by x-ray   16. Pharmacologic therapy   17. Chronic anticoagulation (Eliquis)   18. Chronic use of opiate for therapeutic purpose   19. Encounter for medication management   20. Abnormal MRI, cervical spine (05/12/2021)   21. Abnormal CT scan, lumbar spine (05/11/2021)      Updated Problems: Problem  Lumbar facet joint syndrome (Bilateral)  Lumbosacral facet hypertrophy (Multilevel) (Bilateral)  Lumbar facet arthropathy (Multilevel) (Bilateral)  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[facility])  Cervical facet syndrome (Bilateral)  Cervical facet hypertrophy (Multilevel) (Bilateral)  Spondylosis Without  Myelopathy Or Radiculopathy, Cervical Region  Spondylosis Without Myelopathy Or Radiculopathy, Lumbosacral Region  Abnormal MRI, cervical spine (05/12/2021)   FINDINGS: Motion artifact is present.   Alignment: Anteroposterior alignment is maintained.   Vertebrae: Vertebral body heights are maintained. No substantial marrow edema. No suspicious osseous lesion.   Cord: No abnormal signal.   Posterior Fossa, vertebral arteries, paraspinal tissues: Left superior cerebellar infarct. Otherwise unremarkable.   Disc levels:   C2-C3: Disc bulge with endplate osteophytes. Uncovertebral and facet hypertrophy. No canal or foraminal stenosis.   C3-C4: Disc bulge with endplate osteophytes. Uncovertebral and facet hypertrophy. Mild canal stenosis. Marked foraminal stenosis.   C4-C5: Disc bulge with endplate osteophytes. Uncovertebral and facet hypertrophy. No canal stenosis. Marked right and moderate left foraminal stenosis.   C5-C6: Disc bulge with endplate osteophytes. Uncovertebral and facet hypertrophy. Moderate canal stenosis. Marked foraminal stenosis.   C6-C7: Disc bulge with endplate osteophytes. Uncovertebral hypertrophy. No canal or foraminal stenosis.   C7-T1: Disc bulge with endplate osteophytes. No canal or foraminal stenosis.   IMPRESSION: Multilevel degenerative changes as detailed above with suboptimal stenosis evaluation due to motion degradation. No high-grade canal stenosis. Foraminal narrowing is greatest at C3-C4, C4-C5, and C5-C6.     Electronically Signed   By: Macy Mis M.D.   On: 05/12/2021   Abnormal CT scan, lumbar spine (05/11/2021)   FINDINGS: Segmentation: 5 normally formed lumbar vertebrae. Lowest fully formed disc space denoted as L5-S1.   Alignment: Preservation of the normal lumbar lordosis. Lumbar levocurvature, apex L4. Mild lateral listhesis L4 on L5 of approximately 3 mm. No other significant spondylolisthesis or visible spondylolysis.  Remaining posterior elements are normally aligned.   Vertebrae: Remote appearing superior endplate deformities at T12 with 10% height loss and L1 with up to 20%  height loss. No convincing acute compression deformity is seen. The osseous structures appear diffusely demineralized which may limit detection of small or nondisplaced fractures. No suspicious lytic or blastic lesions. Multilevel discogenic and facet degenerative changes, as detailed below. Additional arthrosis between the spinous processes. Scattered Schmorl's node formations noted in the superior endplate L1 and lower thoracic levels as visible lysed. Included bones of the pelvis are intact and congruent. Mild bilateral SI joint arthrosis.   Paraspinal and other soft tissues: No pre or paravertebral fluid or swelling. No visible canal hematoma.   Included portions of posterior abdomen and pelvis demonstrate aortoiliac atherosclerosis without acute abnormalities.   Disc levels:   Level by level evaluation of the lumbar spine below:   T11-T12: Near complete disc height loss with desiccation and vacuum disc, shallow global disc bulge and bilateral facet arthropathy. No significant canal stenosis. Mild bilateral foraminal narrowing.   T12-L1: Disc height loss, desiccation and global disc bulge with bilateral facet arthropathy. No significant canal or right foraminal narrowing. Mild left foraminal stenosis.   L1-L2: Disc height loss, desiccation and global disc bulge with bilateral facet arthropathy. Mild canal stenosis and bilateral foraminal narrowing.   L2-L3: Disc height loss, desiccation and global disc bulge with bilateral facet arthropathy. No significant canal or right foraminal stenosis. Mild left foraminal narrowing.   L3-L4: Disc height loss, desiccation calcified global disc bulge with superimposed central protrusion and bilateral facet arthropathy. Moderate canal stenosis right foraminal narrowing.  Mild left foraminal narrowing.   L4-L5: Disc height loss, desiccation and global disc bulge with superimposed central left central protrusion and bilateral facet arthropathy. Moderate canal stenosis and bilateral foraminal narrowing, left greater than right.   L5-S1: Disc height loss and desiccation with global disc bulge and bilateral facet arthropathy. No significant canal stenosis. Moderate bilateral foraminal narrowing.   IMPRESSION: 1. No acute fracture vertebral body height loss. 2. Lumbar levocurvature, apex L4. Lateral listhesis L4 on L5 of approximately 3 mm. 3. Remote appearing superior endplate compression deformities at T12 with 10% height loss and L1 with 20% height loss. 4. Multilevel discogenic and facet degenerative changes throughout the lumbar levels, detailed level by level above. Features maximal L4-L5 and L5-S1 with moderate canal stenoses and foraminal narrowing. Additional moderate right foraminal narrowing L3-4 as well. Changes more mild elsewhere. 5. Interspinous arthrosis, can be seen in the setting of [place]rup's disease. 6. Aortic Atherosclerosis (ICD10-I70.0).     Electronically Signed   By: Lovena Le M.D.   On: 05/11/2021   Chronic low back pain (1ry area of Pain) (Bilateral) (R>L) w/o sciatica  Other Intervertebral Disc Degeneration, Lumbar Region    Plan of Care  Problem-specific:  No problem-specific Assessment & Plan notes found for this encounter.  Ms. Lluvia Haeberle has a current medication list which includes the following long-term medication(s): amlodipine-benazepril, apixaban, atorvastatin, calcium carbonate, cetirizine, clonazepam, diazepam, gabapentin, metoprolol succinate, trazodone, [START ON 06/01/2021] hydrocodone-acetaminophen, [START ON 07/01/2021] hydrocodone-acetaminophen, and [START ON 07/31/2021] hydrocodone-acetaminophen.  Pharmacotherapy (Medications Ordered): Meds ordered this encounter  Medications    HYDROcodone-acetaminophen (NORCO/VICODIN) 5-325 MG tablet    Sig: Take 1 tablet by mouth 2 (two) times daily as needed for severe pain. Must last 30 days.    Dispense:  60 tablet    Refill:  0    Not a duplicate. Do NOT delete! Dispense 1 day early if closed on fill date. Warn not to take CNS-depressants 8 hours before or after taking opioid. Do not send refill request. Renewal requires  appointment.   HYDROcodone-acetaminophen (NORCO/VICODIN) 5-325 MG tablet    Sig: Take 1 tablet by mouth 2 (two) times daily as needed for severe pain. Must last 30 days.    Dispense:  60 tablet    Refill:  0    Not a duplicate. Do NOT delete! Dispense 1 day early if closed on fill date. Warn not to take CNS-depressants 8 hours before or after taking opioid. Do not send refill request. Renewal requires appointment.   HYDROcodone-acetaminophen (NORCO/VICODIN) 5-325 MG tablet    Sig: Take 1 tablet by mouth 2 (two) times daily as needed for severe pain. Must last 30 days.    Dispense:  60 tablet    Refill:  0    Not a duplicate. Do NOT delete! Dispense 1 day early if closed on fill date. Warn not to take CNS-depressants 8 hours before or after taking opioid. Do not send refill request. Renewal requires appointment.    Orders:  Orders Placed This Encounter  Procedures   LUMBAR FACET(MEDIAL BRANCH NERVE BLOCK) MBNB    Standing Status:   Future    Standing Expiration Date:   06/17/2021    Scheduling Instructions:     Procedure: Lumbar facet block (AKA.: Lumbosacral medial branch nerve block)     Side: Bilateral     Level: L3-4, L4-5, & L5-S1 Facets (L2, L3, L4, L5, & S1 Medial Branch Nerves)     Sedation: Patient's choice.     Timeframe: ASAA    Order Specific Question:   Where will this procedure be performed?    Answer:   [facility]   Informed Consent Details: Physician/Practitioner Attestation; Transcribe to consent form and obtain patient signature    Nursing Order: Transcribe to consent form and  obtain patient signature. Note: Always confirm laterality of pain with Ms. Erisman, before procedure.    Order Specific Question:   Physician/Practitioner attestation of informed consent for procedure/surgical case    Answer:   I, the physician/practitioner, attest that I have discussed with the patient the benefits, risks, side effects, alternatives, likelihood of achieving goals and potential problems during recovery for the procedure that I have provided informed consent.    Order Specific Question:   Procedure    Answer:   Lumbar Facet Block  under fluoroscopic guidance    Order Specific Question:   Physician/Practitioner performing the procedure    Answer:   Elexa Kivi A. Dossie Arbour MD    Order Specific Question:   Indication/Reason    Answer:   Low Back Pain, with our without leg pain, due to Facet Joint Arthralgia (Joint Pain) Spondylosis (Arthritis of the Spine), without myelopathy or radiculopathy (Nerve Damage).   Blood Thinner Instructions to Nursing    If unable to stop, ask if Lovenox-bridge therapy may be possible, and if so, request their assistance in implementing it.    Scheduling Instructions:     Contact the physician prescribing the blood thinner and request clearance to stop it for time period stipulated below.     If approved by prescribing physician, stop Eliquis (Apixaban) x 3 days prior to procedure or surgery.   Blood Thinner Instructions to Nursing    If the patient requires a Lovenox-bridge therapy, make sure arrangements are made to institute it with the assistance of the PCP.    Standing Status:   Standing    Number of Occurrences:   36    Standing Expiration Date:   05/17/2022    Scheduling Instructions:  Always stop the Eliquis (Apixaban) x 3 days prior to procedure or surgery.    Follow-up plan:   Return for (Clinic) procedure: (B) L-FCT BLK #1, (Blood Thinner Protocol).     Interventional Therapies  Risk  Complexity Considerations:   Estimated body mass  index is 34.2 kg/m as calculated from the following:   Height as of this encounter: 5' 1"  (1.549 m).   Weight as of this encounter: 181 lb (82.1 kg). ELIQUIS + ASA Anticoagulation (Stop: 3 days  Restart: 6 hours)   Planned  Pending:   Diagnostic bilateral lumbar facet MBB #1    Under consideration:   Pending results of CT and MRI  Diagnostic/therapeutic cervical ESI  Diagnostic/therapeutic lumbar facet MBB    Completed:   None at this time   Therapeutic  Palliative (PRN) options:   None established    Recent Visits Date Type Provider Dept  05/02/21 Office Visit Milinda Pointer, MD [facility]  03/14/21 Office Visit Milinda Pointer, MD [facility]  Showing recent visits within past 90 days and meeting all other requirements Today's Visits Date Type Provider Dept  05/17/21 Office Visit Milinda Pointer, MD [facility]  Showing today's visits and meeting all other requirements Future Appointments Date Type Provider Dept  05/31/21 Appointment Milinda Pointer, MD [facility]  Showing future appointments within next 90 days and meeting all other requirements I discussed the assessment and treatment plan with the patient. The patient was provided an opportunity to ask questions and all were answered. The patient agreed with the plan and demonstrated an understanding of the instructions.  Patient advised to call back or seek an in-person evaluation if the symptoms or condition worsens.  Duration of encounter: 40 minutes.  Note by: Gaspar Cola, MD Date: 05/17/2021; Time: 4:22 PM

## 2021-05-17 ENCOUNTER — Other Ambulatory Visit: Payer: Self-pay

## 2021-05-17 ENCOUNTER — Ambulatory Visit: Payer: Medicare Other | Attending: Pain Medicine | Admitting: Pain Medicine

## 2021-05-17 ENCOUNTER — Encounter: Payer: Self-pay | Admitting: Pain Medicine

## 2021-05-17 VITALS — BP 140/92 | HR 81 | Temp 96.8°F | Resp 16 | Ht 61.0 in | Wt 181.0 lb

## 2021-05-17 DIAGNOSIS — R937 Abnormal findings on diagnostic imaging of other parts of musculoskeletal system: Secondary | ICD-10-CM

## 2021-05-17 DIAGNOSIS — Z79891 Long term (current) use of opiate analgesic: Secondary | ICD-10-CM | POA: Diagnosis present

## 2021-05-17 DIAGNOSIS — Z79899 Other long term (current) drug therapy: Secondary | ICD-10-CM | POA: Diagnosis present

## 2021-05-17 DIAGNOSIS — M79605 Pain in left leg: Secondary | ICD-10-CM

## 2021-05-17 DIAGNOSIS — G894 Chronic pain syndrome: Secondary | ICD-10-CM

## 2021-05-17 DIAGNOSIS — M47816 Spondylosis without myelopathy or radiculopathy, lumbar region: Secondary | ICD-10-CM | POA: Diagnosis present

## 2021-05-17 DIAGNOSIS — M461 Sacroiliitis, not elsewhere classified: Secondary | ICD-10-CM

## 2021-05-17 DIAGNOSIS — S32000S Wedge compression fracture of unspecified lumbar vertebra, sequela: Secondary | ICD-10-CM | POA: Diagnosis present

## 2021-05-17 DIAGNOSIS — M47812 Spondylosis without myelopathy or radiculopathy, cervical region: Secondary | ICD-10-CM

## 2021-05-17 DIAGNOSIS — M5136 Other intervertebral disc degeneration, lumbar region: Secondary | ICD-10-CM

## 2021-05-17 DIAGNOSIS — Z7901 Long term (current) use of anticoagulants: Secondary | ICD-10-CM

## 2021-05-17 DIAGNOSIS — G8929 Other chronic pain: Secondary | ICD-10-CM

## 2021-05-17 DIAGNOSIS — M47817 Spondylosis without myelopathy or radiculopathy, lumbosacral region: Secondary | ICD-10-CM

## 2021-05-17 DIAGNOSIS — M8580 Other specified disorders of bone density and structure, unspecified site: Secondary | ICD-10-CM | POA: Diagnosis present

## 2021-05-17 DIAGNOSIS — I25810 Atherosclerosis of coronary artery bypass graft(s) without angina pectoris: Secondary | ICD-10-CM

## 2021-05-17 DIAGNOSIS — M542 Cervicalgia: Secondary | ICD-10-CM | POA: Diagnosis present

## 2021-05-17 DIAGNOSIS — M545 Low back pain, unspecified: Secondary | ICD-10-CM | POA: Diagnosis present

## 2021-05-17 DIAGNOSIS — M79604 Pain in right leg: Secondary | ICD-10-CM

## 2021-05-17 HISTORY — DX: Abnormal findings on diagnostic imaging of other parts of musculoskeletal system: R93.7

## 2021-05-17 MED ORDER — HYDROCODONE-ACETAMINOPHEN 5-325 MG PO TABS: 1.0000 | ORAL_TABLET | Freq: Two times a day (BID) | ORAL | 0 refills | Status: DC | PRN

## 2021-05-17 NOTE — Progress Notes (Signed)
Nursing Pain Medication Assessment:  Safety precautions to be maintained throughout the outpatient stay will include: orient to surroundings, keep bed in low position, maintain call bell within reach at all times, provide assistance with transfer out of bed and ambulation.  Medication Inspection Compliance: Pill count conducted under aseptic conditions, in front of the patient. Neither the pills nor the bottle was removed from the patient's sight at any time. Once count was completed pills were immediately returned to the patient in their original bottle.  Medication: Hydrocodone/APAP Pill/Patch Count:  13 of 60 pills remain Pill/Patch Appearance: Markings consistent with prescribed medication Bottle Appearance: Standard pharmacy container. Clearly labeled. Filled Date: 08 / 01 / 2022 Last Medication intake:  Today

## 2021-05-17 NOTE — Patient Instructions (Signed)
______________________________________________________________________  Preparing for Procedure with Sedation  NOTICE: Due to recent regulatory changes, starting on May 02, 2021, procedures requiring intravenous (IV) sedation will no longer be performed at the [facility].  These types of procedures are required to be performed at [facility] ambulatory surgery facility.  We are very sorry for the inconvenience.  Procedure appointments are limited to planned procedures: No Prescription Refills. No disability issues will be discussed. No medication changes will be discussed.  Instructions: Oral Intake: Do not eat or drink anything for at least 8 hours prior to your procedure. (Exception: Blood Pressure Medication. See below.) Transportation: A driver is required. You may not drive yourself after the procedure. Blood Pressure Medicine: Do not forget to take your blood pressure medicine with a sip of water the morning of the procedure. If your Diastolic (lower reading) is above 100 mmHg, elective cases will be cancelled/rescheduled. Blood thinners: These will need to be stopped for procedures. Notify our staff if you are taking any blood thinners. Depending on which one you take, there will be specific instructions on how and when to stop it. Diabetics on insulin: Notify the staff so that you can be scheduled 1st case in the morning. If your diabetes requires high dose insulin, take only  of your normal insulin dose the morning of the procedure and notify the staff that you have done so. Preventing infections: Shower with an antibacterial soap the morning of your procedure. Build-up your immune system: Take 1000 mg of Vitamin C with every meal (3 times a day) the day prior to your procedure. Antibiotics: Inform the staff if you have a condition or reason that requires you to take antibiotics before dental procedures. Pregnancy: If you are pregnant, call and cancel the procedure. Sickness: If  you have a cold, fever, or any active infections, call and cancel the procedure. Arrival: You must be in the facility at least 30 minutes prior to your scheduled procedure. Children: Do not bring children with you. Dress appropriately: Bring dark clothing that you would not mind if they get stained. Valuables: Do not bring any jewelry or valuables.  Reasons to call and reschedule or cancel your procedure: (Following these recommendations will minimize the risk of a serious complication.) Surgeries: Avoid having procedures within 2 weeks of any surgery. (Avoid for 2 weeks before or after any surgery). Flu Shots: Avoid having procedures within 2 weeks of a flu shots. (Avoid for 2 weeks before or after immunizations). Barium: Avoid having a procedure within 7-10 days after having had a radiological study involving the use of radiological contrast. (Myelograms, Barium swallow or enema study). Heart attacks: Avoid any elective procedures or surgeries for the initial 6 months after a "Myocardial Infarction" (Heart Attack). Blood thinners: It is imperative that you stop these medications before procedures. Let us know if you if you take any blood thinner.  Infection: Avoid procedures during or within two weeks of an infection (including chest colds or gastrointestinal problems). Symptoms associated with infections include: Localized redness, fever, chills, night sweats or profuse sweating, burning sensation when voiding, cough, congestion, stuffiness, runny nose, sore throat, diarrhea, nausea, vomiting, cold or Flu symptoms, recent or current infections. It is specially important if the infection is over the area that we intend to treat. Heart and lung problems: Symptoms that may suggest an active cardiopulmonary problem include: cough, chest pain, breathing difficulties or shortness of breath, dizziness, ankle swelling, uncontrolled high or unusually low blood pressure, and/or palpitations. If you are  experiencing any of these symptoms, cancel your procedure and contact your primary care physician for an evaluation.  Remember:  Regular Business hours are:  Monday to Thursday 8:00 AM to 4:00 PM  Provider's Schedule: Magda Muise, MD:  Procedure days: Tuesday and Thursday 7:30 AM to 4:00 PM  Bilal Lateef, MD:  Procedure days: Monday and Wednesday 7:30 AM to 4:00 PM ______________________________________________________________________  ____________________________________________________________________________________________  General Risks and Possible Complications  Patient Responsibilities: It is important that you read this as it is part of your informed consent. It is our duty to inform you of the risks and possible complications associated with treatments offered to you. It is your responsibility as a patient to read this and to ask questions about anything that is not clear or that you believe was not covered in this document.  Patient's Rights: You have the right to refuse treatment. You also have the right to change your mind, even after initially having agreed to have the treatment done. However, under this last option, if you wait until the last second to change your mind, you may be charged for the materials used up to that point.  Introduction: Medicine is not an exact science. Everything in Medicine, including the lack of treatment(s), carries the potential for danger, harm, or loss (which is by definition: Risk). In Medicine, a complication is a secondary problem, condition, or disease that can aggravate an already existing one. All treatments carry the risk of possible complications. The fact that a side effects or complications occurs, does not imply that the treatment was conducted incorrectly. It must be clearly understood that these can happen even when everything is done following the highest safety standards.  No treatment: You can choose not to proceed with the  proposed treatment alternative. The "PRO(s)" would include: avoiding the risk of complications associated with the therapy. The "CON(s)" would include: not getting any of the treatment benefits. These benefits fall under one of three categories: diagnostic; therapeutic; and/or palliative. Diagnostic benefits include: getting information which can ultimately lead to improvement of the disease or symptom(s). Therapeutic benefits are those associated with the successful treatment of the disease. Finally, palliative benefits are those related to the decrease of the primary symptoms, without necessarily curing the condition (example: decreasing the pain from a flare-up of a chronic condition, such as incurable terminal cancer).  General Risks and Complications: These are associated to most interventional treatments. They can occur alone, or in combination. They fall under one of the following six (6) categories: no benefit or worsening of symptoms; bleeding; infection; nerve damage; allergic reactions; and/or death. No benefits or worsening of symptoms: In Medicine there are no guarantees, only probabilities. No healthcare provider can ever guarantee that a medical treatment will work, they can only state the probability that it may. Furthermore, there is always the possibility that the condition may worsen, either directly, or indirectly, as a consequence of the treatment. Bleeding: This is more common if the patient is taking a blood thinner, either prescription or over the counter (example: Goody Powders, Fish oil, Aspirin, Garlic, etc.), or if suffering a condition associated with impaired coagulation (example: Hemophilia, cirrhosis of the liver, low platelet counts, etc.). However, even if you do not have one on these, it can still happen. If you have any of these conditions, or take one of these drugs, make sure to notify your treating physician. Infection: This is more common in patients with a compromised  immune system, either due to disease (example:   diabetes, cancer, human immunodeficiency virus [HIV], etc.), or due to medications or treatments (example: therapies used to treat cancer and rheumatological diseases). However, even if you do not have one on these, it can still happen. If you have any of these conditions, or take one of these drugs, make sure to notify your treating physician. Nerve Damage: This is more common when the treatment is an invasive one, but it can also happen with the use of medications, such as those used in the treatment of cancer. The damage can occur to small secondary nerves, or to large primary ones, such as those in the spinal cord and brain. This damage may be temporary or permanent and it may lead to impairments that can range from temporary numbness to permanent paralysis and/or brain death. Allergic Reactions: Any time a substance or material comes in contact with our body, there is the possibility of an allergic reaction. These can range from a mild skin rash (contact dermatitis) to a severe systemic reaction (anaphylactic reaction), which can result in death. Death: In general, any medical intervention can result in death, most of the time due to an unforeseen complication. ____________________________________________________________________________________________ ____________________________________________________________________________________________  Blood Thinners  IMPORTANT NOTICE:  If you take any of these, make sure to notify the nursing staff.  Failure to do so may result in injury.  Recommended time intervals to stop and restart blood-thinners, before & after invasive procedures  Generic Name Brand Name Pre-procedure. Stop this long before procedure. Post-procedure. Minimum waiting period before restarting.  Abciximab Reopro 15 days 2 hrs  Alteplase Activase 10 days 10 days  Anagrelide Agrylin    Apixaban Eliquis 3 days 6 hrs  Cilostazol Pletal  3 days 5 hrs  Clopidogrel Plavix 7-10 days 2 hrs  Dabigatran Pradaxa 5 days 6 hrs  Dalteparin Fragmin 24 hours 4 hrs  Dipyridamole Aggrenox 11days 2 hrs  Edoxaban Lixiana; Savaysa 3 days 2 hrs  Enoxaparin  Lovenox 24 hours 4 hrs  Eptifibatide Integrillin 8 hours 2 hrs  Fondaparinux  Arixtra 72 hours 12 hrs  Hydroxychloroquine Plaquenil 11 days   Prasugrel Effient 7-10 days 6 hrs  Reteplase Retavase 10 days 10 days  Rivaroxaban Xarelto 3 days 6 hrs  Ticagrelor Brilinta 5-7 days 6 hrs  Ticlopidine Ticlid 10-14 days 2 hrs  Tinzaparin Innohep 24 hours 4 hrs  Tirofiban Aggrastat 8 hours 2 hrs  Warfarin Coumadin 5 days 2 hrs   Other medications with blood-thinning effects  Product indications Generic (Brand) names Note  Cholesterol Lipitor Stop 4 days before procedure  Blood thinner (injectable) Heparin (LMW or LMWH Heparin) Stop 24 hours before procedure  Cancer Ibrutinib (Imbruvica) Stop 7 days before procedure  Malaria/Rheumatoid Hydroxychloroquine (Plaquenil) Stop 11 days before procedure  Thrombolytics  10 days before or after procedures   Over-the-counter (OTC) Products with blood-thinning effects  Product Common names Stop Time  Aspirin > 325 mg Goody Powders, Excedrin, etc. 11 days  Aspirin ? 81 mg  7 days  Fish oil  4 days  Garlic supplements  7 days  Ginkgo biloba  36 hours  Ginseng  24 hours  NSAIDs Ibuprofen, Naprosyn, etc. 3 days  Vitamin E  4 days   ____________________________________________________________________________________________   ____________________________________________________________________________________________  Medication Rules  Purpose: To inform patients, and their family members, of our rules and regulations.  Applies to: All patients receiving prescriptions (written or electronic).  Pharmacy of record: Pharmacy where electronic prescriptions will be sent. If written prescriptions are taken to a different pharmacy, please  inform the  nursing staff. The pharmacy listed in the electronic medical record should be the one where you would like electronic prescriptions to be sent.  Electronic prescriptions: In compliance with the [facility] Strengthen Opioid Misuse Prevention (STOP) Act of 2017 (Session Conni ElliotLaw [phone]), effective October 02, 2018, all controlled substances must be electronically prescribed. Calling prescriptions to the pharmacy will cease to exist.  Prescription refills: Only during scheduled appointments. Applies to all prescriptions.  NOTE: The following applies primarily to controlled substances (Opioid* Pain Medications).   Type of encounter (visit): For patients receiving controlled substances, face-to-face visits are required. (Not an option or up to the patient.)  Patient's responsibilities: Pain Pills: Bring all pain pills to every appointment (except for procedure appointments). Pill Bottles: Bring pills in original pharmacy bottle. Always bring the newest bottle. Bring bottle, even if empty. Medication refills: You are responsible for knowing and keeping track of what medications you take and those you need refilled. The day before your appointment: write a list of all prescriptions that need to be refilled. The day of the appointment: give the list to the admitting nurse. Prescriptions will be written only during appointments. No prescriptions will be written on procedure days. If you forget a medication: it will not be "Called in", "Faxed", or "electronically sent". You will need to get another appointment to get these prescribed. No early refills. Do not call asking to have your prescription filled early. Prescription Accuracy: You are responsible for carefully inspecting your prescriptions before leaving our office. Have the discharge nurse carefully go over each prescription with you, before taking them home. Make sure that your name is accurately spelled, that your address is correct. Check the  name and dose of your medication to make sure it is accurate. Check the number of pills, and the written instructions to make sure they are clear and accurate. Make sure that you are given enough medication to last until your next medication refill appointment. Taking Medication: Take medication as prescribed. When it comes to controlled substances, taking less pills or less frequently than prescribed is permitted and encouraged. Never take more pills than instructed. Never take medication more frequently than prescribed.  Inform other Doctors: Always inform, all of your healthcare providers, of all the medications you take. Pain Medication from other Providers: You are not allowed to accept any additional pain medication from any other Doctor or Healthcare provider. There are two exceptions to this rule. (see below) In the event that you require additional pain medication, you are responsible for notifying us, as stated below. Cough Medicine: Often these contain an opioid, such as codeine or hydrocodone. Never accept or take cough medicine containing these opioids if you are already taking an opioid* medication. The combination may cause respiratory failure and death. Medication Agreement: You are responsible for carefully reading and following our Medication Agreement. This must be signed before receiving any prescriptions from our practice. Safely store a copy of your signed Agreement. Violations to the Agreement will result in no further prescriptions. (Additional copies of our Medication Agreement are available upon request.) Laws, Rules, & Regulations: All patients are expected to follow all [address]Federal and Walt DisneyState Laws, [employer] IndustriesStatutes, Rules,  [place]& Regulations. Ignorance of the Laws does not constitute a valid excuse.  Illegal drugs and Controlled Substances: The use of illegal substances (including, but not limited to marijuana and its derivatives) and/or the illegal use of any controlled substances is strictly  prohibited. Violation of this rule may result in the immediate  and permanent discontinuation of any and all prescriptions being written by our practice. The use of any illegal substances is prohibited. Adopted CDC guidelines & recommendations: Target dosing levels will be at or below 60 MME/day. Use of benzodiazepines** is not recommended.  Exceptions: There are only two exceptions to the rule of not receiving pain medications from other Healthcare Providers. Exception #1 (Emergencies): In the event of an emergency (i.e.: accident requiring emergency care), you are allowed to receive additional pain medication. However, you are responsible for: As soon as you are able, call our office [phone], at any time of the day or night, and leave a message stating your name, the date and nature of the emergency, and the name and dose of the medication prescribed. In the event that your call is answered by a member of our staff, make sure to document and save the date, time, and the name of the person that took your information.  Exception #2 (Planned Surgery): In the event that you are scheduled by another doctor or dentist to have any type of surgery or procedure, you are allowed (for a period no longer than 30 days), to receive additional pain medication, for the acute post-op pain. However, in this case, you are responsible for picking up a copy of our "Post-op Pain Management for Surgeons" handout, and giving it to your surgeon or dentist. This document is available at our office, and does not require an appointment to obtain it. Simply go to our office during business hours (Monday-Thursday from 8:00 AM to 4:00 PM) (Friday 8:00 AM to 12:00 Noon) or if you have a scheduled appointment with [place], prior to your surgery, and ask for it by name. In addition, you are responsible for: calling our office [phone], at any time of the day or night, and leaving a message stating your name, name of your surgeon, type  of surgery, and date of procedure or surgery. Failure to comply with your responsibilities may result in termination of therapy involving the controlled substances.  *Opioid medications include: morphine, codeine, oxycodone, oxymorphone, hydrocodone, hydromorphone, meperidine, tramadol, tapentadol, buprenorphine, fentanyl, methadone. **Benzodiazepine medications include: diazepam (Valium), alprazolam (Xanax), clonazepam (Klonopine), lorazepam (Ativan), clorazepate (Tranxene), chlordiazepoxide (Librium), estazolam (Prosom), oxazepam (Serax), temazepam (Restoril), triazolam (Halcion) (Last updated: 08/30/2020) ____________________________________________________________________________________________  ____________________________________________________________________________________________  Medication Recommendations and Reminders  Applies to: All patients receiving prescriptions (written and/or electronic).  Medication Rules & Regulations: These rules and regulations exist for your safety and that of others. They are not flexible and neither are we. Dismissing or ignoring them will be considered "non-compliance" with medication therapy, resulting in complete and irreversible termination of such therapy. (See document titled "Medication Rules" for more details.) In all conscience, because of safety reasons, we cannot continue providing a therapy where the patient does not follow instructions.  Pharmacy of record:  Definition: This is the pharmacy where your electronic prescriptions will be sent.  We do not endorse any particular pharmacy, however, we have experienced problems with [facility] not securing enough medication supply for the community. We do not restrict you in your choice of pharmacy. However, once we write for your prescriptions, we will NOT be re-sending more prescriptions to fix restricted supply problems created by your pharmacy, or your insurance.  The pharmacy listed in the  electronic medical record should be the one where you want electronic prescriptions to be sent. If you choose to change pharmacy, simply notify our nursing staff.  Recommendations: Keep all of your pain medications in  a safe place, under lock and key, even if you live alone. We will NOT replace lost, stolen, or damaged medication. After you fill your prescription, take 1 week's worth of pills and put them away in a safe place. You should keep a separate, properly labeled bottle for this purpose. The remainder should be kept in the original bottle. Use this as your primary supply, until it runs out. Once it's gone, then you know that you have 1 week's worth of medicine, and it is time to come in for a prescription refill. If you do this correctly, it is unlikely that you will ever run out of medicine. To make sure that the above recommendation works, it is very important that you make sure your medication refill appointments are scheduled at least 1 week before you run out of medicine. To do this in an effective manner, make sure that you do not leave the office without scheduling your next medication management appointment. Always ask the nursing staff to show you in your prescription , when your medication will be running out. Then arrange for the receptionist to get you a return appointment, at least 7 days before you run out of medicine. Do not wait until you have 1 or 2 pills left, to come in. This is very poor planning and does not take into consideration that we may need to cancel appointments due to bad weather, sickness, or emergencies affecting our staff. DO NOT ACCEPT A "Partial Fill": If for any reason your pharmacy does not have enough pills/tablets to completely fill or refill your prescription, do not allow for a "partial fill". The law allows the pharmacy to complete that prescription within 72 hours, without requiring a new prescription. If they do not fill the rest of your prescription within  those 72 hours, you will need a separate prescription to fill the remaining amount, which we will NOT provide. If the reason for the partial fill is your insurance, you will need to talk to the pharmacist about payment alternatives for the remaining tablets, but again, DO NOT ACCEPT A PARTIAL FILL, unless you can trust your pharmacist to obtain the remainder of the pills within 72 hours.  Prescription refills and/or changes in medication(s):  Prescription refills, and/or changes in dose or medication, will be conducted only during scheduled medication management appointments. (Applies to both, written and electronic prescriptions.) No refills on procedure days. No medication will be changed or started on procedure days. No changes, adjustments, and/or refills will be conducted on a procedure day. Doing so will interfere with the diagnostic portion of the procedure. No phone refills. No medications will be "called into the pharmacy". No Fax refills. No weekend refills. No Holliday refills. No after hours refills.  Remember:  Business hours are:  Monday to Thursday 8:00 AM to 4:00 PM Provider's Schedule: Delano Metz, MD - Appointments are:  Medication management: Monday and Wednesday 8:00 AM to 4:00 PM Procedure day: Tuesday and Thursday 7:30 AM to 4:00 PM Edward Jolly, MD - Appointments are:  Medication management: Tuesday and Thursday 8:00 AM to 4:00 PM Procedure day: Monday and Wednesday 7:30 AM to 4:00 PM (Last update: 04/21/2020) ____________________________________________________________________________________________  ____________________________________________________________________________________________  CBD (cannabidiol) WARNING  Applicable to: All individuals currently taking or considering taking CBD (cannabidiol) and, more important, all patients taking opioid analgesic controlled substances (pain medication). (Example: oxycodone; oxymorphone; hydrocodone;  hydromorphone; morphine; methadone; tramadol; tapentadol; fentanyl; buprenorphine; butorphanol; dextromethorphan; meperidine; codeine; etc.)  Legal status: CBD remains a Schedule I  drug prohibited for any use. CBD is illegal with one exception. In the [place], CBD has a limited Education officer, environmental (FDA) approval for the treatment of two specific types of epilepsy disorders. Only one CBD product has been approved by the FDA for this purpose: "Epidiolex". FDA is aware that some companies are marketing products containing cannabis and cannabis-derived compounds in ways that violate the [employer], Drug and Cosmetic Act [place] [facility] Act) and that may put the health and safety of consumers at risk. The FDA, a Federal agency, has not enforced the CBD status since 2018.   Legality: Some manufacturers ship CBD products nationally, which is illegal. Often such products are sold online and are therefore available throughout the country. CBD is openly sold in head shops and health food stores in some states where such sales have not been explicitly legalized. Selling unapproved products with unsubstantiated therapeutic claims is not only a violation of the law, but also can put patients at risk, as these products have not been proven to be safe or effective. Federal illegality makes it difficult to conduct research on CBD.  Reference: "FDA Regulation of Cannabis and Cannabis-Derived Products, Including Cannabidiol (CBD)" - [employer]  Warning: CBD is not FDA approved and has not undergo the same manufacturing controls as prescription drugs.  This means that the purity and safety of available CBD may be questionable. Most of the time, despite manufacturer's claims, it is contaminated with THC (delta-9-tetrahydrocannabinol - the chemical in marijuana responsible for the "HIGH").  When this is  the case, the [facility] contaminant will trigger a positive urine drug screen (UDS) test for Marijuana (carboxy-THC). Because a positive UDS for any illicit substance is a violation of our medication agreement, your opioid analgesics (pain medicine) may be permanently discontinued.  MORE ABOUT CBD  General Information: CBD  is a derivative of the Marijuana (cannabis sativa) plant discovered in 58. It is one of the 113 identified substances found in Marijuana. It accounts for up to 40% of the plant's extract. As of 2018, preliminary clinical studies on CBD included research for the treatment of anxiety, movement disorders, and pain. CBD is available and consumed in multiple forms, including inhalation of smoke or vapor, as an aerosol spray, and by mouth. It may be supplied as an oil containing CBD, capsules, dried cannabis, or as a liquid solution. CBD is thought not to be as psychoactive as THC (delta-9-tetrahydrocannabinol - the chemical in marijuana responsible for the "HIGH"). Studies suggest that CBD may interact with different biological target receptors in the body, including cannabinoid and other neurotransmitter receptors. As of 2018 the mechanism of action for its biological effects has not been determined.  Side-effects  Adverse reactions: Dry mouth, diarrhea, decreased appetite, fatigue, drowsiness, malaise, weakness, sleep disturbances, and others.  Drug interactions: CBC may interact with other medications such as blood-thinners. (Last update: 05/08/2020) ____________________________________________________________________________________________  ____________________________________________________________________________________________  Drug Holidays (Slow)  What is a "Drug Holiday"? Drug Holiday: is the name given to the period of time during which a patient stops taking a medication(s) for the purpose of eliminating tolerance to the drug.  Benefits Improved effectiveness of  opioids. Decreased opioid dose needed to achieve benefits. Improved pain with lesser dose.  What is tolerance? Tolerance: is the progressive decreased in effectiveness of a drug due to its repetitive use. With repetitive use, the body gets use to the medication and as a consequence, it loses its effectiveness. This is a common problem seen with opioid pain medications.  As a result, a larger dose of the drug is needed to achieve the same effect that used to be obtained with a smaller dose.  How long should a "Drug Holiday" last? You should stay off of the pain medicine for at least 14 consecutive days. (2 weeks)  Should I stop the medicine "cold [place]"? No. You should always coordinate with your Pain Specialist so that he/she can provide you with the correct medication dose to make the transition as smoothly as possible.  How do I stop the medicine? Slowly. You will be instructed to decrease the daily amount of pills that you take by one (1) pill every seven (7) days. This is called a "slow downward taper" of your dose. For example: if you normally take four (4) pills per day, you will be asked to drop this dose to three (3) pills per day for seven (7) days, then to two (2) pills per day for seven (7) days, then to one (1) per day for seven (7) days, and at the end of those last seven (7) days, this is when the "Drug Holiday" would start.   Will I have withdrawals? By doing a "slow downward taper" like this one, it is unlikely that you will experience any significant withdrawal symptoms. Typically, what triggers withdrawals is the sudden stop of a high dose opioid therapy. Withdrawals can usually be avoided by slowly decreasing the dose over a prolonged period of time. If you do not follow these instructions and decide to stop your medication abruptly, withdrawals may be possible.  What are withdrawals? Withdrawals: refers to the wide range of symptoms that occur after stopping or dramatically  reducing opiate drugs after heavy and prolonged use. Withdrawal symptoms do not occur to patients that use low dose opioids, or those who take the medication sporadically. Contrary to benzodiazepine (example: Valium, Xanax, etc.) or alcohol withdrawals ("Delirium Tremens"), opioid withdrawals are not lethal. Withdrawals are the physical manifestation of the body getting rid of the excess receptors.  Expected Symptoms Early symptoms of withdrawal may include: Agitation Anxiety Muscle aches Increased tearing Insomnia Runny nose Sweating Yawning  Late symptoms of withdrawal may include: Abdominal cramping Diarrhea Dilated pupils Goose bumps Nausea Vomiting  Will I experience withdrawals? Due to the slow nature of the taper, it is very unlikely that you will experience any.  What is a slow taper? Taper: refers to the gradual decrease in dose.  (Last update: 04/21/2020) ____________________________________________________________________________________________   ____________________________________________________________________________________________  Virtual Visits   What is a "Virtual Visit"? It is a Mining engineer (medical visit) that takes place on real time (NOT TEXT or E-MAIL) over the telephone or computer device (desktop, laptop, tablet, smart phone, etc.). It allows for more location flexibility between the patient and the healthcare provider.  Who decides when these types of visits will be used? The physician.  Who is eligible for these types of visits? Only those patients that can be reliably reached over the telephone.  What do you mean by reliably? We do not have time to call everyone multiple times, therefore those that tend to screen calls and then call back later are not suitable candidates for this system. We understand how people are reluctant to pickup on "unknown" calls, therefore, we suggest adding our telephone numbers to your list  of "CONTACT(s)". This way, you should be able to readily identify our calls when you receive one. All of our numbers are available below.   Who is not eligible? This option  is not available for medication management encounters, specially for controlled substances. Patients on pain medications that fall under the category of controlled substances have to come in for "Face-to-Face" encounters. This is required for mandatory monitoring of these substances. You may be asked to provide a sample for an unannounced urine drug screening test (UDS), and we will need to count your pain pills. Not bringing your pills to be counted may result in no refill. Obviously, neither one of these can be done over the phone.  When will this type of visits be used? You can request a virtual visit whenever you are physically unable to attend a regular appointment. The decision will be made by the physician (or healthcare provider) on a case by case basis.   At what time will I be called? This is an excellent question. The providers will try to call you whenever they have time available. Do not expect to be called at any specific time. The secretaries will assign you a time for your virtual visit appointment, but this is done simply to keep a list of those patients that need to be called, but not for the purpose of keeping a time schedule. Be advised that the call may come in anytime during the day, between the hours of 8:00 AM and 8::00 PM, depending on provider availability. We do understand that the system is not perfect. If you are unable to be available that day on a moments notice, then request an "in-person" appointment rather than a "virtual visit".  Can I request my medication visits to be "Virtual"? Yes you may request it, but the decision is entirely up to the healthcare provider. Control substances require specific monitoring that requires Face-to-Face encounters. The number of encounters  and the extent of the  monitoring is determined on a case by case basis.  Add a new contact to your smart phone and label it "PAIN CLINIC" Under this contact add the following numbers: Main: [phone] (Official Contact Number) Nurses: [phone] (These are outgoing only calling systems. Do not call this number.) Dr. Laban Emperor: [phone] or [phone] (Outgoing calls only. Do not call this number.)  ____________________________________________________________________________________________

## 2021-05-19 ENCOUNTER — Encounter: Payer: Self-pay | Admitting: Adult Health

## 2021-05-26 ENCOUNTER — Telehealth: Payer: Self-pay

## 2021-05-26 MED ORDER — CLONAZEPAM 0.5 MG PO TABS: 0.5000 mg | ORAL_TABLET | Freq: Every day | ORAL | 0 refills | Status: DC | PRN

## 2021-05-26 NOTE — Telephone Encounter (Signed)
Sent to pharmacy 

## 2021-05-26 NOTE — Addendum Note (Signed)
Addended by: Glori Luis on: 05/26/2021 02:49 PM   Modules accepted: Orders

## 2021-05-26 NOTE — Telephone Encounter (Signed)
Pt needs a refill on clonazePAM (KLONOPIN) 0.5 MG tablet sent to the [facility] in [facility] on [address]

## 2021-05-30 NOTE — Progress Notes (Signed)
PROVIDER NOTE: Information contained herein reflects review and annotations entered in association with encounter. Interpretation of such information and data should be left to medically-trained personnel. Information provided to patient can be located elsewhere in the medical record under "Patient Instructions". Document created using STT-dictation technology, any transcriptional errors that may result from process are unintentional.    Patient: Jerene DillingIngrid Iturralde  Service Category: Procedure  Provider: Oswaldo DoneFrancisco A Datrell Dunton, MD  DOB: 08/05/1941  DOS: 05/31/2021  Location: [facility]  MRN: [id number]  Setting: Ambulatory - outpatient  Referring Provider: Stephanie AcreFlinchum, Michelle S, F*  Type: Established Patient  Specialty: Interventional Pain Management  PCP: Berniece PapFlinchum, Michelle S, FNP   Primary Reason for Visit: Interventional Pain Management Treatment. CC: Back Pain   Procedure:          Anesthesia, Analgesia, Anxiolysis:  Type: Lumbar Facet, Medial Branch Block(s)          Primary Purpose: Diagnostic Region: Posterolateral Lumbosacral Spine Level: L2, L3, L4, L5, & S1 Medial Branch Level(s). Injecting these levels blocks the L3-4, L4-5, and L5-S1 lumbar facet joints. Laterality: Bilateral  Type: Minimal (Conscious) Anxiolysis combined with Local Anesthesia Indication(s): Analgesia and Anxiety Route: Intravenous (IV) IV Access: Secured Sedation: Meaningful verbal contact was maintained at all times during the procedure  Local Anesthetic: Lidocaine 1-2%  Position: Prone   Indications: 1. Lumbar facet joint syndrome (Bilateral)   2. Lumbar facet arthropathy (Multilevel) (Bilateral)   3. Spondylosis without myelopathy or radiculopathy, lumbosacral region   4. DDD (degenerative disc disease), lumbosacral   5. Other chronic pain   6. 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 Chronic anticoagulation (Eliquis)   8. Encounter for therapeutic procedure     Pain Score: Pre-procedure: 5 /10 Post-procedure: 0-No pain/10    Pre-op H&P Assessment:  Ms. Woollard is a 80 y.o. (year old), female patient, seen today for interventional treatment. She  has a past surgical history that includes Cardiac catheterization; Coronary angioplasty; Coronary artery bypass graft; and back injections. Ms. Balch has a current medication list which includes the following prescription(s): amlodipine-benazepril, aspirin ec, atorvastatin, calcium carbonate, cetirizine, vitamin d3, clonazepam, ergocalciferol, gabapentin, [START ON 06/01/2021] hydrocodone-acetaminophen, levothyroxine, metoprolol succinate, nystatin-triamcinolone ointment, trazodone, apixaban, [START ON 07/01/2021] hydrocodone-acetaminophen, [START ON 07/31/2021] hydrocodone-acetaminophen, and vitamin b-12. Her primarily concern today is the Back Pain  Initial Vital Signs:  Pulse/HCG Rate: (!) 44ECG Heart Rate: 79 Temp: (!) 97 F (36.1 C) Resp: 18 BP: 124/74 SpO2: 99 %  BMI: Estimated body mass index is 35.35 kg/m as calculated from the following:   Height as of this encounter: 5' (1.524 m).   Weight as of this encounter: 181 lb (82.1 kg).  Risk Assessment: Allergies: Reviewed. She has No Known Allergies.  Allergy Precautions: None required Coagulopathies: Reviewed. None identified.  Blood-thinner therapy: None at this time Active Infection(s): Reviewed. None identified. Ms. Selleck is afebrile  Site Confirmation: Ms. Jost was asked to confirm the procedure and laterality before marking the site Procedure checklist: Completed Consent: Before the procedure and under the influence of no sedative(s), amnesic(s), or anxiolytics, the patient was informed of the treatment options, risks and possible complications. To fulfill our ethical and legal obligations, as recommended by the American Medical Association's Code of Ethics, I have informed the patient of my clinical impression; the nature and purpose of the  treatment or procedure; the risks, benefits, and possible complications of the intervention; the alternatives, including doing nothing; the risk(s) and benefit(s)  of the alternative treatment(s) or procedure(s); and the risk(s) and benefit(s) of doing nothing. The patient was provided information about the general risks and possible complications associated with the procedure. These may include, but are not limited to: failure to achieve desired goals, infection, bleeding, organ or nerve damage, allergic reactions, paralysis, and death. In addition, the patient was informed of those risks and complications associated to Spine-related procedures, such as failure to decrease pain; infection (i.e.: Meningitis, epidural or intraspinal abscess); bleeding (i.e.: epidural hematoma, subarachnoid hemorrhage, or any other type of intraspinal or peri-dural bleeding); organ or nerve damage (i.e.: Any type of peripheral nerve, nerve root, or spinal cord injury) with subsequent damage to sensory, motor, and/or autonomic systems, resulting in permanent pain, numbness, and/or weakness of one or several areas of the body; allergic reactions; (i.e.: anaphylactic reaction); and/or death. Furthermore, the patient was informed of those risks and complications associated with the medications. These include, but are not limited to: allergic reactions (i.e.: anaphylactic or anaphylactoid reaction(s)); adrenal axis suppression; blood sugar elevation that in diabetics may result in ketoacidosis or comma; water retention that in patients with history of congestive heart failure may result in shortness of breath, pulmonary edema, and decompensation with resultant heart failure; weight gain; swelling or edema; medication-induced neural toxicity; particulate matter embolism and blood vessel occlusion with resultant organ, and/or nervous system infarction; and/or aseptic necrosis of one or more joints. Finally, the patient was informed that  Medicine is not an exact science; therefore, there is also the possibility of unforeseen or unpredictable risks and/or possible complications that may result in a catastrophic outcome. The patient indicated having understood very clearly. We have given the patient no guarantees and we have made no promises. Enough time was given to the patient to ask questions, all of which were answered to the patient's satisfaction. Ms. Point has indicated that she wanted to continue with the procedure. Attestation: I, the ordering provider, attest that I have discussed with the patient the benefits, risks, side-effects, alternatives, likelihood of achieving goals, and potential problems during recovery for the procedure that I have provided informed consent. Date  Time: 05/31/2021  8:09 AM  Pre-Procedure Preparation:  Monitoring: As per clinic protocol. Respiration, ETCO2, SpO2, BP, heart rate and rhythm monitor placed and checked for adequate function Safety Precautions: Patient was assessed for positional comfort and pressure points before starting the procedure. Time-out: I initiated and conducted the "Time-out" before starting the procedure, as per protocol. The patient was asked to participate by confirming the accuracy of the "Time Out" information. Verification of the correct person, site, and procedure were performed and confirmed by me, the nursing staff, and the patient. "Time-out" conducted as per Joint Commission's Universal Protocol (UP.01.01.01). Time: 0904  Description of Procedure:          Laterality: Bilateral. The procedure was performed in identical fashion on both sides. Levels:  L2, L3, L4, L5, & S1 Medial Branch Level(s) Area Prepped: Posterior Lumbosacral Region DuraPrep (Iodine Povacrylex [0.7% available iodine] and Isopropyl Alcohol, 74% w/w) Safety Precautions: Aspiration looking for blood return was conducted prior to all injections. At no point did we inject any substances, as a needle  was being advanced. Before injecting, the patient was told to immediately notify me if she was experiencing any new onset of "ringing in the ears, or metallic taste in the mouth". No attempts were made at seeking any paresthesias. Safe injection practices and needle disposal techniques used. Medications properly checked for expiration dates. SDV (  single dose vial) medications used. After the completion of the procedure, all disposable equipment used was discarded in the proper designated medical waste containers. Local Anesthesia: Protocol guidelines were followed. The patient was positioned over the fluoroscopy table. The area was prepped in the usual manner. The time-out was completed. The target area was identified using fluoroscopy. A 12-in long, straight, sterile hemostat was used with fluoroscopic guidance to locate the targets for each level blocked. Once located, the skin was marked with an approved surgical skin marker. Once all sites were marked, the skin (epidermis, dermis, and hypodermis), as well as deeper tissues (fat, connective tissue and muscle) were infiltrated with a small amount of a short-acting local anesthetic, loaded on a 10cc syringe with a 25G, 1.5-in  Needle. An appropriate amount of time was allowed for local anesthetics to take effect before proceeding to the next step. Local Anesthetic: Lidocaine 2.0% The unused portion of the local anesthetic was discarded in the proper designated containers. Technical explanation of process:  L2 Medial Branch Nerve Block (MBB): The target area for the L2 medial branch is at the junction of the postero-lateral aspect of the superior articular process and the superior, posterior, and medial edge of the transverse process of L3. Under fluoroscopic guidance, a Quincke needle was inserted until contact was made with os over the superior postero-lateral aspect of the pedicular shadow (target area). After negative aspiration for blood, 0.5 mL of the  nerve block solution was injected without difficulty or complication. The needle was removed intact. L3 Medial Branch Nerve Block (MBB): The target area for the L3 medial branch is at the junction of the postero-lateral aspect of the superior articular process and the superior, posterior, and medial edge of the transverse process of L4. Under fluoroscopic guidance, a Quincke needle was inserted until contact was made with os over the superior postero-lateral aspect of the pedicular shadow (target area). After negative aspiration for blood, 0.5 mL of the nerve block solution was injected without difficulty or complication. The needle was removed intact. L4 Medial Branch Nerve Block (MBB): The target area for the L4 medial branch is at the junction of the postero-lateral aspect of the superior articular process and the superior, posterior, and medial edge of the transverse process of L5. Under fluoroscopic guidance, a Quincke needle was inserted until contact was made with os over the superior postero-lateral aspect of the pedicular shadow (target area). After negative aspiration for blood, 0.5 mL of the nerve block solution was injected without difficulty or complication. The needle was removed intact. L5 Medial Branch Nerve Block (MBB): The target area for the L5 medial branch is at the junction of the postero-lateral aspect of the superior articular process and the superior, posterior, and medial edge of the sacral ala. Under fluoroscopic guidance, a Quincke needle was inserted until contact was made with os over the superior postero-lateral aspect of the pedicular shadow (target area). After negative aspiration for blood, 0.5 mL of the nerve block solution was injected without difficulty or complication. The needle was removed intact. S1 Medial Branch Nerve Block (MBB): The target area for the S1 medial branch is at the posterior and inferior 6 o'clock position of the L5-S1 facet joint. Under fluoroscopic  guidance, the Quincke needle inserted for the L5 MBB was redirected until contact was made with os over the inferior and postero aspect of the sacrum, at the 6 o' clock position under the L5-S1 facet joint (Target area). After negative aspiration for blood, 0.5  mL of the nerve block solution was injected without difficulty or complication. The needle was removed intact.  Nerve block solution: 0.2% PF-Ropivacaine + Triamcinolone (40 mg/mL) diluted to a final concentration of 4 mg of Triamcinolone/mL of Ropivacaine The unused portion of the solution was discarded in the proper designated containers. Procedural Needles: 22-gauge, 3.5-inch, Quincke needles used for all levels.  Once the entire procedure was completed, the treated area was cleaned, making sure to leave some of the prepping solution back to take advantage of its long term bactericidal properties.      Illustration of the posterior view of the lumbar spine and the posterior neural structures. Laminae of L2 through S1 are labeled. DPRL5, dorsal primary ramus of L5; DPRS1, dorsal primary ramus of S1; DPR3, dorsal primary ramus of L3; FJ, facet (zygapophyseal) joint L3-L4; I, inferior articular process of L4; LB1, lateral branch of dorsal primary ramus of L1; IAB, inferior articular branches from L3 medial branch (supplies L4-L5 facet joint); IBP, intermediate branch plexus; MB3, medial branch of dorsal primary ramus of L3; NR3, third lumbar nerve root; S, superior articular process of L5; SAB, superior articular branches from L4 (supplies L4-5 facet joint also); TP3, transverse process of L3.  Vitals:   05/31/21 0917 05/31/21 0925 05/31/21 0935 05/31/21 0945  BP: (!) 154/114 (!) 132/115 (!) 142/86 (!) 154/90  Pulse:      Resp: Temp:  (!) 96.9 F (36.1 C)  (!) 96.9 F (36.1 C)  TempSrc:      SpO2: 100% 100% 100% 100%  Weight:      Height:         Start Time: 0904 hrs. End Time: 0917 hrs.  Imaging Guidance (Spinal):           Type of Imaging Technique: Fluoroscopy Guidance (Spinal) Indication(s): Assistance in needle guidance and placement for procedures requiring needle placement in or near specific anatomical locations not easily accessible without such assistance. Exposure Time: Please see nurses notes. Contrast: None used. Fluoroscopic Guidance: I was personally present during the use of fluoroscopy. "Tunnel Vision Technique" used to obtain the best possible view of the target area. Parallax error corrected before commencing the procedure. "Direction-depth-direction" technique used to introduce the needle under continuous pulsed fluoroscopy. Once target was reached, antero-posterior, oblique, and lateral fluoroscopic projection used confirm needle placement in all planes. Images permanently stored in EMR. Interpretation: No contrast injected. I personally interpreted the imaging intraoperatively. Adequate needle placement confirmed in multiple planes. Permanent images saved into the patient's record.  Antibiotic Prophylaxis:   Anti-infectives (From admission, onward)    None      Indication(s): None identified  Post-operative Assessment:  Post-procedure Vital Signs:  Pulse/HCG Rate: (!) 4467 Temp: (!) 96.9 F (36.1 C) Resp: 16 BP: (!) 154/90 SpO2: 100 %  EBL: None  Complications: No immediate post-treatment complications observed by team, or reported by patient.  Note: The patient tolerated the entire procedure well. A repeat set of vitals were taken after the procedure and the patient was kept under observation following institutional policy, for this type of procedure. Post-procedural neurological assessment was performed, showing return to baseline, prior to discharge. The patient was provided with post-procedure discharge instructions, including a section on how to identify potential problems. Should any problems arise concerning this procedure, the patient was given instructions to  immediately contact us, at any time, without hesitation. In any case, we plan to contact the patient by telephone for a follow-up status report  regarding this interventional procedure.  Comments:  No additional relevant information.  Plan of Care  Orders:  Orders Placed This Encounter  Procedures   LUMBAR FACET(MEDIAL BRANCH NERVE BLOCK) MBNB    Scheduling Instructions:     Procedure: Lumbar facet block (AKA.: Lumbosacral medial branch nerve block)     Side: Bilateral     Level: L3-4, L4-5, & L5-S1 Facets (L2, L3, L4, L5, & S1 Medial Branch Nerves)     Sedation: Patient's choice.     Timeframe: Today    Order Specific Question:   Where will this procedure be performed?    Answer:   [facility]   DG [facility] C-ARM 1-60 MIN NO REPORT    Intraoperative interpretation by procedural physician at [facility].    Standing Status:   Standing    Number of Occurrences:   1    Order Specific Question:   Reason for exam:    Answer:   Assistance in needle guidance and placement for procedures requiring needle placement in or near specific anatomical locations not easily accessible without such assistance.   Informed Consent Details: Physician/Practitioner Attestation; Transcribe to consent form and obtain patient signature    Nursing Order: Transcribe to consent form and obtain patient signature. Note: Always confirm laterality of pain with Ms. Wingert, before procedure.    Order Specific Question:   Physician/Practitioner attestation of informed consent for procedure/surgical case    Answer:   I, the physician/practitioner, attest that I have discussed with the patient the benefits, risks, side effects, alternatives, likelihood of achieving goals and potential problems during recovery for the procedure that I have provided informed consent.    Order Specific Question:   Procedure    Answer:   Lumbar Facet Block  under fluoroscopic guidance    Order Specific Question:    Physician/Practitioner performing the procedure    Answer:   Sukaina Toothaker A. Laban Emperor MD    Order Specific Question:   Indication/Reason    Answer:   Low Back Pain, with our without leg pain, due to Facet Joint Arthralgia (Joint Pain) Spondylosis (Arthritis of the Spine), without myelopathy or radiculopathy (Nerve Damage).   Care order/instruction: Please confirm that the patient has stopped the Eliquis (Apixaban) x 3 days prior to procedure or surgery.    Please confirm that the patient has stopped the Eliquis (Apixaban) x 3 days prior to procedure or surgery.    Standing Status:   Standing    Number of Occurrences:   1   Provide equipment / supplies at bedside    "Block Tray" (Disposable  single use) Needle type: SpinalSpinal Amount/quantity: 4 Size: Medium (5-inch) Gauge: 22G    Standing Status:   Standing    Number of Occurrences:   1    Order Specific Question:   Specify    Answer:   Block Tray   Bleeding precautions    Standing Status:   Standing    Number of Occurrences:   1    Chronic Opioid Analgesic:  Hydrocodone/APAP 5/325, 1 tab p.o. BID (10 mg/day of hydrocodone) (10 MME) (last filled on 05/02/2021) MME/day: 10 mg/day   Medications ordered for procedure: Meds ordered this encounter  Medications   lidocaine (XYLOCAINE) 2 % (with pres) injection 400 mg   lactated ringers infusion 1,000 mL   midazolam (VERSED) 5 MG/5ML injection 0.5-2 mg    Make sure Flumazenil is available in the pyxis when using this medication. If oversedation occurs, administer 0.2  mg IV over 15 sec. If after 45 sec no response, administer 0.2 mg again over 1 min; may repeat at 1 min intervals; not to exceed 4 doses (1 mg)   ropivacaine (PF) 2 mg/mL (0.2%) (NAROPIN) injection 18 mL   triamcinolone acetonide (KENALOG-40) injection 80 mg    Medications administered: We administered lidocaine, lactated ringers, midazolam, ropivacaine (PF) 2 mg/mL (0.2%), and triamcinolone acetonide.  See the medical  record for exact dosing, route, and time of administration.  Follow-up plan:   Return in about 2 weeks (around 06/14/2021) for Proc-day(T,Th), (F2F), (PPE).       Interventional Therapies  Risk  Complexity Considerations:   Estimated body mass index is 34.2 kg/m as calculated from the following:   Height as of this encounter:  (1.549 m).   Weight as of this encounter: 181 lb (82.1 kg). ELIQUIS + ASA Anticoagulation (Stop: 3 days  Restart: 6 hours)   Planned  Pending:   Diagnostic bilateral lumbar facet MBB #1    Under consideration:   Pending results of CT and MRI  Diagnostic/therapeutic cervical ESI  Diagnostic/therapeutic lumbar facet MBB    Completed:   None at this time   Therapeutic  Palliative (PRN) options:   None established     Recent Visits Date Type Provider Dept  05/17/21 Office Visit Delano Metz, MD [facility]  05/02/21 Office Visit Delano Metz, MD [facility]  03/14/21 Office Visit Delano Metz, MD [facility]  Showing recent visits within past 90 days and meeting all other requirements Today's Visits Date Type Provider Dept  05/31/21 Procedure visit Delano Metz, MD [facility]  Showing today's visits and meeting all other requirements Future Appointments Date Type Provider Dept  06/16/21 Appointment Delano Metz, MD [facility]  08/17/21 Appointment Delano Metz, MD [facility]  Showing future appointments within next 90 days and meeting all other requirements Disposition: Discharge home  Discharge (Date  Time): 05/31/2021; 0951 hrs.   Primary Care Physician: Berniece Pap, FNP Location[facility] Note by: Oswaldo Done, MD Date: 05/31/2021; Time: 12:26 PM  Disclaimer:  Medicine is not an Visual merchandiser. The only guarantee in medicine is that nothing is guaranteed. It is important to note that the  decision to proceed with this intervention was based on the information collected from the patient. The Data and conclusions were drawn from the patient's questionnaire, the interview, and the physical examination. Because the information was provided in large part by the patient, it cannot be guaranteed that it has not been purposely or unconsciously manipulated. Every effort has been made to obtain as much relevant data as possible for this evaluation. It is important to note that the conclusions that lead to this procedure are derived in large part from the available data. Always take into account that the treatment will also be dependent on availability of resources and existing treatment guidelines, considered by other Pain Management Practitioners as being common knowledge and practice, at the time of the intervention. For Medico-Legal purposes, it is also important to point out that variation in procedural techniques and pharmacological choices are the acceptable norm. The indications, contraindications, technique, and results of the above procedure should only be interpreted and judged by a Board-Certified Interventional Pain Specialist with extensive familiarity and expertise in the same exact procedure and technique.

## 2021-05-31 ENCOUNTER — Encounter: Payer: Self-pay | Admitting: Pain Medicine

## 2021-05-31 ENCOUNTER — Ambulatory Visit
Admission: RE | Admit: 2021-05-31 | Discharge: 2021-05-31 | Disposition: A | Discharge status: Home / Self Care | Payer: Medicare Other | Source: Ambulatory Visit | Attending: Pain Medicine | Admitting: Pain Medicine

## 2021-05-31 ENCOUNTER — Other Ambulatory Visit: Payer: Self-pay

## 2021-05-31 ENCOUNTER — Ambulatory Visit (HOSPITAL_BASED_OUTPATIENT_CLINIC_OR_DEPARTMENT_OTHER): Payer: Medicare Other | Admitting: Pain Medicine

## 2021-05-31 VITALS — BP 154/90 | HR 44 | Temp 96.9°F | Resp 16 | Ht 60.0 in | Wt 181.0 lb

## 2021-05-31 DIAGNOSIS — M545 Low back pain, unspecified: Secondary | ICD-10-CM | POA: Insufficient documentation

## 2021-05-31 DIAGNOSIS — M47816 Spondylosis without myelopathy or radiculopathy, lumbar region: Secondary | ICD-10-CM | POA: Insufficient documentation

## 2021-05-31 DIAGNOSIS — M47817 Spondylosis without myelopathy or radiculopathy, lumbosacral region: Secondary | ICD-10-CM | POA: Diagnosis present

## 2021-05-31 DIAGNOSIS — M5137 Other intervertebral disc degeneration, lumbosacral region: Secondary | ICD-10-CM | POA: Insufficient documentation

## 2021-05-31 DIAGNOSIS — Z5189 Encounter for other specified aftercare: Secondary | ICD-10-CM | POA: Insufficient documentation

## 2021-05-31 DIAGNOSIS — Z7901 Long term (current) use of anticoagulants: Secondary | ICD-10-CM | POA: Insufficient documentation

## 2021-05-31 DIAGNOSIS — G8929 Other chronic pain: Secondary | ICD-10-CM | POA: Insufficient documentation

## 2021-05-31 MED ORDER — TRIAMCINOLONE ACETONIDE 40 MG/ML IJ SUSP
INTRAMUSCULAR | Status: AC
  Filled 2021-05-31: qty 2

## 2021-05-31 MED ORDER — LIDOCAINE HCL 2 % IJ SOLN
20.0000 mL | Freq: Once | INTRAMUSCULAR | Status: AC
  Administered 2021-05-31: 100 mg

## 2021-05-31 MED ORDER — LACTATED RINGERS IV SOLN
1000.0000 mL | Freq: Once | INTRAVENOUS | Status: AC
  Administered 2021-05-31: 1000 mL via INTRAVENOUS

## 2021-05-31 MED ORDER — MIDAZOLAM HCL 5 MG/5ML IJ SOLN
INTRAMUSCULAR | Status: AC
  Filled 2021-05-31: qty 5

## 2021-05-31 MED ORDER — ROPIVACAINE HCL 2 MG/ML IJ SOLN
INTRAMUSCULAR | Status: AC
  Filled 2021-05-31: qty 20

## 2021-05-31 MED ORDER — ROPIVACAINE HCL 2 MG/ML IJ SOLN
18.0000 mL | Freq: Once | INTRAMUSCULAR | Status: AC
  Administered 2021-05-31: 18 mL via PERINEURAL

## 2021-05-31 MED ORDER — TRIAMCINOLONE ACETONIDE 40 MG/ML IJ SUSP
80.0000 mg | Freq: Once | INTRAMUSCULAR | Status: AC
  Administered 2021-05-31: 80 mg

## 2021-05-31 MED ORDER — MIDAZOLAM HCL 5 MG/5ML IJ SOLN
0.5000 mg | Freq: Once | INTRAMUSCULAR | Status: AC
  Administered 2021-05-31: 1.5 mg via INTRAVENOUS

## 2021-05-31 MED ORDER — LIDOCAINE HCL (PF) 2 % IJ SOLN
INTRAMUSCULAR | Status: AC
  Filled 2021-05-31: qty 20

## 2021-05-31 NOTE — Patient Instructions (Addendum)

## 2021-05-31 NOTE — Progress Notes (Signed)
Safety precautions to be maintained throughout the outpatient stay will include: orient to surroundings, keep bed in low position, maintain call bell within reach at all times, provide assistance with transfer out of bed and ambulation.  

## 2021-06-01 ENCOUNTER — Telehealth: Payer: Self-pay

## 2021-06-01 NOTE — Telephone Encounter (Signed)
Post procedure phone call.  LM 

## 2021-06-16 ENCOUNTER — Other Ambulatory Visit: Payer: Self-pay

## 2021-06-16 ENCOUNTER — Ambulatory Visit: Payer: Medicare Other | Attending: Pain Medicine | Admitting: Pain Medicine

## 2021-06-16 VITALS — BP 110/84 | HR 89 | Temp 96.3°F | Resp 18 | Ht 61.0 in | Wt 180.0 lb

## 2021-06-16 DIAGNOSIS — M5030 Other cervical disc degeneration, unspecified cervical region: Secondary | ICD-10-CM | POA: Diagnosis present

## 2021-06-16 DIAGNOSIS — S32000S Wedge compression fracture of unspecified lumbar vertebra, sequela: Secondary | ICD-10-CM | POA: Diagnosis present

## 2021-06-16 DIAGNOSIS — M47812 Spondylosis without myelopathy or radiculopathy, cervical region: Secondary | ICD-10-CM | POA: Diagnosis present

## 2021-06-16 DIAGNOSIS — R937 Abnormal findings on diagnostic imaging of other parts of musculoskeletal system: Secondary | ICD-10-CM

## 2021-06-16 DIAGNOSIS — M79605 Pain in left leg: Secondary | ICD-10-CM | POA: Diagnosis present

## 2021-06-16 DIAGNOSIS — M542 Cervicalgia: Secondary | ICD-10-CM | POA: Insufficient documentation

## 2021-06-16 DIAGNOSIS — G8929 Other chronic pain: Secondary | ICD-10-CM | POA: Insufficient documentation

## 2021-06-16 DIAGNOSIS — M79604 Pain in right leg: Secondary | ICD-10-CM | POA: Insufficient documentation

## 2021-06-16 DIAGNOSIS — Z79891 Long term (current) use of opiate analgesic: Secondary | ICD-10-CM

## 2021-06-16 DIAGNOSIS — M47816 Spondylosis without myelopathy or radiculopathy, lumbar region: Secondary | ICD-10-CM | POA: Diagnosis present

## 2021-06-16 DIAGNOSIS — G894 Chronic pain syndrome: Secondary | ICD-10-CM | POA: Diagnosis present

## 2021-06-16 DIAGNOSIS — M5137 Other intervertebral disc degeneration, lumbosacral region: Secondary | ICD-10-CM | POA: Insufficient documentation

## 2021-06-16 DIAGNOSIS — Z7901 Long term (current) use of anticoagulants: Secondary | ICD-10-CM | POA: Insufficient documentation

## 2021-06-16 DIAGNOSIS — I25810 Atherosclerosis of coronary artery bypass graft(s) without angina pectoris: Secondary | ICD-10-CM

## 2021-06-16 DIAGNOSIS — Z79899 Other long term (current) drug therapy: Secondary | ICD-10-CM | POA: Diagnosis present

## 2021-06-16 DIAGNOSIS — M545 Low back pain, unspecified: Secondary | ICD-10-CM | POA: Diagnosis present

## 2021-06-16 MED ORDER — HYDROCODONE-ACETAMINOPHEN 5-325 MG PO TABS: 1.0000 | ORAL_TABLET | Freq: Two times a day (BID) | ORAL | 0 refills | Status: DC | PRN

## 2021-06-16 NOTE — Patient Instructions (Addendum)
____________________________________________________________________________________________  Medication Rules  Purpose: To inform patients, and their family members, of our rules and regulations.  Applies to: All patients receiving prescriptions (written or electronic).  Pharmacy of record: Pharmacy where electronic prescriptions will be sent. If written prescriptions are taken to a different pharmacy, please inform the nursing staff. The pharmacy listed in the electronic medical record should be the one where you would like electronic prescriptions to be sent.  Electronic prescriptions: In compliance with the [place] Strengthen Opioid Misuse Prevention (STOP) Act of 2017 (Session Law 2017-74/H243), effective October 02, 2018, all controlled substances must be electronically prescribed. Calling prescriptions to the pharmacy will cease to exist.  Prescription refills: Only during scheduled appointments. Applies to all prescriptions.  NOTE: The following applies primarily to controlled substances (Opioid* Pain Medications).   Type of encounter (visit): For patients receiving controlled substances, face-to-face visits are required. (Not an option or up to the patient.)  Patient's responsibilities: Pain Pills: Bring all pain pills to every appointment (except for procedure appointments). Pill Bottles: Bring pills in original pharmacy bottle. Always bring the newest bottle. Bring bottle, even if empty. Medication refills: You are responsible for knowing and keeping track of what medications you take and those you need refilled. The day before your appointment: write a list of all prescriptions that need to be refilled. The day of the appointment: give the list to the admitting nurse. Prescriptions will be written only during appointments. No prescriptions will be written on procedure days. If you forget a medication: it will not be "Called in", "Faxed", or "electronically sent". You will  need to get another appointment to get these prescribed. No early refills. Do not call asking to have your prescription filled early. Prescription Accuracy: You are responsible for carefully inspecting your prescriptions before leaving our office. Have the discharge nurse carefully go over each prescription with you, before taking them home. Make sure that your name is accurately spelled, that your address is correct. Check the name and dose of your medication to make sure it is accurate. Check the number of pills, and the written instructions to make sure they are clear and accurate. Make sure that you are given enough medication to last until your next medication refill appointment. Taking Medication: Take medication as prescribed. When it comes to controlled substances, taking less pills or less frequently than prescribed is permitted and encouraged. Never take more pills than instructed. Never take medication more frequently than prescribed.  Inform other Doctors: Always inform, all of your healthcare providers, of all the medications you take. Pain Medication from other Providers: You are not allowed to accept any additional pain medication from any other Doctor or Healthcare provider. There are two exceptions to this rule. (see below) In the event that you require additional pain medication, you are responsible for notifying us, as stated below. Cough Medicine: Often these contain an opioid, such as codeine or hydrocodone. Never accept or take cough medicine containing these opioids if you are already taking an opioid* medication. The combination may cause respiratory failure and death. Medication Agreement: You are responsible for carefully reading and following our Medication Agreement. This must be signed before receiving any prescriptions from our practice. Safely store a copy of your signed Agreement. Violations to the Agreement will result in no further prescriptions. (Additional copies of our  Medication Agreement are available upon request.) Laws, Rules, & Regulations: All patients are expected to follow all Federal and State Laws, Statutes, Rules, & Regulations. Ignorance of   the Laws does not constitute a valid excuse.  Illegal drugs and Controlled Substances: The use of illegal substances (including, but not limited to marijuana and its derivatives) and/or the illegal use of any controlled substances is strictly prohibited. Violation of this rule may result in the immediate and permanent discontinuation of any and all prescriptions being written by our practice. The use of any illegal substances is prohibited. Adopted CDC guidelines & recommendations: Target dosing levels will be at or below 60 MME/day. Use of benzodiazepines** is not recommended.  Exceptions: There are only two exceptions to the rule of not receiving pain medications from other Healthcare Providers. Exception #1 (Emergencies): In the event of an emergency (i.e.: accident requiring emergency care), you are allowed to receive additional pain medication. However, you are responsible for: As soon as you are able, call our office [phone], at any time of the day or night, and leave a message stating your name, the date and nature of the emergency, and the name and dose of the medication prescribed. In the event that your call is answered by a member of our staff, make sure to document and save the date, time, and the name of the person that took your information.  Exception #2 (Planned Surgery): In the event that you are scheduled by another doctor or dentist to have any type of surgery or procedure, you are allowed (for a period no longer than 30 days), to receive additional pain medication, for the acute post-op pain. However, in this case, you are responsible for picking up a copy of our "Post-op Pain Management for Surgeons" handout, and giving it to your surgeon or dentist. This document is available at our office, and  does not require an appointment to obtain it. Simply go to our office during business hours (Monday-Thursday from 8:00 AM to 4:00 PM) (Friday 8:00 AM to 12:00 Noon) or if you have a scheduled appointment with us, prior to your surgery, and ask for it by name. In addition, you are responsible for: calling our office [phone], at any time of the day or night, and leaving a message stating your name, name of your surgeon, type of surgery, and date of procedure or surgery. Failure to comply with your responsibilities may result in termination of therapy involving the controlled substances.  *Opioid medications include: morphine, codeine, oxycodone, oxymorphone, hydrocodone, hydromorphone, meperidine, tramadol, tapentadol, buprenorphine, fentanyl, methadone. **Benzodiazepine medications include: diazepam (Valium), alprazolam (Xanax), clonazepam (Klonopine), lorazepam (Ativan), clorazepate (Tranxene), chlordiazepoxide (Librium), estazolam (Prosom), oxazepam (Serax), temazepam (Restoril), triazolam (Halcion) (Last updated: 08/30/2020) ____________________________________________________________________________________________  ____________________________________________________________________________________________  Medication Recommendations and Reminders  Applies to: All patients receiving prescriptions (written and/or electronic).  Medication Rules & Regulations: These rules and regulations exist for your safety and that of others. They are not flexible and neither are we. Dismissing or ignoring them will be considered "non-compliance" with medication therapy, resulting in complete and irreversible termination of such therapy. (See document titled "Medication Rules" for more details.) In all conscience, because of safety reasons, we cannot continue providing a therapy where the patient does not follow instructions.  Pharmacy of record:  Definition: This is the pharmacy where your electronic  prescriptions will be sent.  We do not endorse any particular pharmacy, however, we have experienced problems with [facility] not securing enough medication supply for the community. We do not restrict you in your choice of pharmacy. However, once we write for your prescriptions, we will NOT be re-sending more prescriptions to fix restricted supply problems   created by your pharmacy, or your insurance.  The pharmacy listed in the electronic medical record should be the one where you want electronic prescriptions to be sent. If you choose to change pharmacy, simply notify our nursing staff.  Recommendations: Keep all of your pain medications in a safe place, under lock and key, even if you live alone. We will NOT replace lost, stolen, or damaged medication. After you fill your prescription, take 1 week's worth of pills and put them away in a safe place. You should keep a separate, properly labeled bottle for this purpose. The remainder should be kept in the original bottle. Use this as your primary supply, until it runs out. Once it's gone, then you know that you have 1 week's worth of medicine, and it is time to come in for a prescription refill. If you do this correctly, it is unlikely that you will ever run out of medicine. To make sure that the above recommendation works, it is very important that you make sure your medication refill appointments are scheduled at least 1 week before you run out of medicine. To do this in an effective manner, make sure that you do not leave the office without scheduling your next medication management appointment. Always ask the nursing staff to show you in your prescription , when your medication will be running out. Then arrange for the receptionist to get you a return appointment, at least 7 days before you run out of medicine. Do not wait until you have 1 or 2 pills left, to come in. This is very poor planning and does not take into consideration that we may need to  cancel appointments due to bad weather, sickness, or emergencies affecting our staff. DO NOT ACCEPT A "Partial Fill": If for any reason your pharmacy does not have enough pills/tablets to completely fill or refill your prescription, do not allow for a "partial fill". The law allows the pharmacy to complete that prescription within 72 hours, without requiring a new prescription. If they do not fill the rest of your prescription within those 72 hours, you will need a separate prescription to fill the remaining amount, which we will NOT provide. If the reason for the partial fill is your insurance, you will need to talk to the pharmacist about payment alternatives for the remaining tablets, but again, DO NOT ACCEPT A PARTIAL FILL, unless you can trust your pharmacist to obtain the remainder of the pills within 72 hours.  Prescription refills and/or changes in medication(s):  Prescription refills, and/or changes in dose or medication, will be conducted only during scheduled medication management appointments. (Applies to both, written and electronic prescriptions.) No refills on procedure days. No medication will be changed or started on procedure days. No changes, adjustments, and/or refills will be conducted on a procedure day. Doing so will interfere with the diagnostic portion of the procedure. No phone refills. No medications will be "called into the pharmacy". No Fax refills. No weekend refills. No Holliday refills. No after hours refills.  Remember:  Business hours are:  Monday to Thursday 8:00 AM to 4:00 PM Provider's Schedule: Francisco Naveira, MD - Appointments are:  Medication management: Monday and Wednesday 8:00 AM to 4:00 PM Procedure day: Tuesday and Thursday 7:30 AM to 4:00 PM Bilal Lateef, MD - Appointments are:  Medication management: Tuesday and Thursday 8:00 AM to 4:00 PM Procedure day: Monday and Wednesday 7:30 AM to 4:00 PM (Last update:  04/21/2020) ____________________________________________________________________________________________  ____________________________________________________________________________________________  CBD (cannabidiol) WARNING    Applicable to: All individuals currently taking or considering taking CBD (cannabidiol) and, more important, all patients taking opioid analgesic controlled substances (pain medication). (Example: oxycodone; oxymorphone; hydrocodone; hydromorphone; morphine; methadone; tramadol; tapentadol; fentanyl; buprenorphine; butorphanol; dextromethorphan; meperidine; codeine; etc.)  Legal status: CBD remains a Schedule I drug prohibited for any use. CBD is illegal with one exception. In the [place], CBD has a limited Food and Drug Administration (FDA) approval for the treatment of two specific types of epilepsy disorders. Only one CBD product has been approved by the FDA for this purpose: "Epidiolex". FDA is aware that some companies are marketing products containing cannabis and cannabis-derived compounds in ways that violate the Federal Food, Drug and Cosmetic Act (FD&C Act) and that may put the health and safety of consumers at risk. The FDA, a Federal agency, has not enforced the CBD status since 2018.   Legality: Some manufacturers ship CBD products nationally, which is illegal. Often such products are sold online and are therefore available throughout the country. CBD is openly sold in head shops and health food stores in some states where such sales have not been explicitly legalized. Selling unapproved products with unsubstantiated therapeutic claims is not only a violation of the law, but also can put patients at risk, as these products have not been proven to be safe or effective. Federal illegality makes it difficult to conduct research on CBD.  Reference: "FDA Regulation of Cannabis and Cannabis-Derived Products, Including Cannabidiol (CBD)" -  https://www.fda.gov/news-events/public-health-focus/fda-regulation-cannabis-and-cannabis-derived-products-including-cannabidiol-cbd  Warning: CBD is not FDA approved and has not undergo the same manufacturing controls as prescription drugs.  This means that the purity and safety of available CBD may be questionable. Most of the time, despite manufacturer's claims, it is contaminated with THC (delta-9-tetrahydrocannabinol - the chemical in marijuana responsible for the "HIGH").  When this is the case, the THC contaminant will trigger a positive urine drug screen (UDS) test for Marijuana (carboxy-THC). Because a positive UDS for any illicit substance is a violation of our medication agreement, your opioid analgesics (pain medicine) may be permanently discontinued.  MORE ABOUT CBD  General Information: CBD  is a derivative of the Marijuana (cannabis sativa) plant discovered in 1940. It is one of the 113 identified substances found in Marijuana. It accounts for up to 40% of the plant's extract. As of 2018, preliminary clinical studies on CBD included research for the treatment of anxiety, movement disorders, and pain. CBD is available and consumed in multiple forms, including inhalation of smoke or vapor, as an aerosol spray, and by mouth. It may be supplied as an oil containing CBD, capsules, dried cannabis, or as a liquid solution. CBD is thought not to be as psychoactive as THC (delta-9-tetrahydrocannabinol - the chemical in marijuana responsible for the "HIGH"). Studies suggest that CBD may interact with different biological target receptors in the body, including cannabinoid and other neurotransmitter receptors. As of 2018 the mechanism of action for its biological effects has not been determined.  Side-effects  Adverse reactions: Dry mouth, diarrhea, decreased appetite, fatigue, drowsiness, malaise, weakness, sleep disturbances, and others.  Drug interactions: CBC may interact with other medications  such as blood-thinners. (Last update: 05/08/2020) ____________________________________________________________________________________________  ____________________________________________________________________________________________  Drug Holidays (Slow)  What is a "Drug Holiday"? Drug Holiday: is the name given to the period of time during which a patient stops taking a medication(s) for the purpose of eliminating tolerance to the drug.  Benefits Improved effectiveness of opioids. Decreased opioid dose needed to achieve benefits. Improved pain with lesser dose.    What is tolerance? Tolerance: is the progressive decreased in effectiveness of a drug due to its repetitive use. With repetitive use, the body gets use to the medication and as a consequence, it loses its effectiveness. This is a common problem seen with opioid pain medications. As a result, a larger dose of the drug is needed to achieve the same effect that used to be obtained with a smaller dose.  How long should a "Drug Holiday" last? You should stay off of the pain medicine for at least 14 consecutive days. (2 weeks)  Should I stop the medicine "cold [place]"? No. You should always coordinate with your Pain Specialist so that he/she can provide you with the correct medication dose to make the transition as smoothly as possible.  How do I stop the medicine? Slowly. You will be instructed to decrease the daily amount of pills that you take by one (1) pill every seven (7) days. This is called a "slow downward taper" of your dose. For example: if you normally take four (4) pills per day, you will be asked to drop this dose to three (3) pills per day for seven (7) days, then to two (2) pills per day for seven (7) days, then to one (1) per day for seven (7) days, and at the end of those last seven (7) days, this is when the "Drug Holiday" would start.   Will I have withdrawals? By doing a "slow downward taper" like this one, it  is unlikely that you will experience any significant withdrawal symptoms. Typically, what triggers withdrawals is the sudden stop of a high dose opioid therapy. Withdrawals can usually be avoided by slowly decreasing the dose over a prolonged period of time. If you do not follow these instructions and decide to stop your medication abruptly, withdrawals may be possible.  What are withdrawals? Withdrawals: refers to the wide range of symptoms that occur after stopping or dramatically reducing opiate drugs after heavy and prolonged use. Withdrawal symptoms do not occur to patients that use low dose opioids, or those who take the medication sporadically. Contrary to benzodiazepine (example: Valium, Xanax, etc.) or alcohol withdrawals ("Delirium Tremens"), opioid withdrawals are not lethal. Withdrawals are the physical manifestation of the body getting rid of the excess receptors.  Expected Symptoms Early symptoms of withdrawal may include: Agitation Anxiety Muscle aches Increased tearing Insomnia Runny nose Sweating Yawning  Late symptoms of withdrawal may include: Abdominal cramping Diarrhea Dilated pupils Goose bumps Nausea Vomiting  Will I experience withdrawals? Due to the slow nature of the taper, it is very unlikely that you will experience any.  What is a slow taper? Taper: refers to the gradual decrease in dose.  (Last update: 04/21/2020) ____________________________________________________________________________________________   ______________________________________________________________________  Preparing for your procedure (without sedation)  Procedure appointments are limited to planned procedures: No Prescription Refills. No disability issues will be discussed. No medication changes will be discussed.  Instructions: Oral Intake: Do not eat or drink anything for at least 6 hours prior to your procedure. (Exception: Blood Pressure Medication. See  below.) Transportation: Unless otherwise stated by your physician, you may drive yourself after the procedure. Blood Pressure Medicine: Do not forget to take your blood pressure medicine with a sip of water the morning of the procedure. If your Diastolic (lower reading)is above 100 mmHg, elective cases will be cancelled/rescheduled. Blood thinners: These will need to be stopped for procedures. Notify our staff if you are taking any blood thinners. Depending on which one you  take, there will be specific instructions on how and when to stop it. Diabetics on insulin: Notify the staff so that you can be scheduled 1st case in the morning. If your diabetes requires high dose insulin, take only  of your normal insulin dose the morning of the procedure and notify the staff that you have done so. Preventing infections: Shower with an antibacterial soap the morning of your procedure.  Build-up your immune system: Take 1000 mg of Vitamin C with every meal (3 times a day) the day prior to your procedure. Antibiotics: Inform the staff if you have a condition or reason that requires you to take antibiotics before dental procedures. Pregnancy: If you are pregnant, call and cancel the procedure. Sickness: If you have a cold, fever, or any active infections, call and cancel the procedure. Arrival: You must be in the facility at least 30 minutes prior to your scheduled procedure. Children: Do not bring any children with you. Dress appropriately: Bring dark clothing that you would not mind if they get stained. Valuables: Do not bring any jewelry or valuables.  Reasons to call and reschedule or cancel your procedure: (Following these recommendations will minimize the risk of a serious complication.) Surgeries: Avoid having procedures within 2 weeks of any surgery. (Avoid for 2 weeks before or after any surgery). Flu Shots: Avoid having procedures within 2 weeks of a flu shots or . (Avoid for 2 weeks before or after  immunizations). Barium: Avoid having a procedure within 7-10 days after having had a radiological study involving the use of radiological contrast. (Myelograms, Barium swallow or enema study). Heart attacks: Avoid any elective procedures or surgeries for the initial 6 months after a "Myocardial Infarction" (Heart Attack). Blood thinners: It is imperative that you stop these medications before procedures. Let us know if you if you take any blood thinner.  Infection: Avoid procedures during or within two weeks of an infection (including chest colds or gastrointestinal problems). Symptoms associated with infections include: Localized redness, fever, chills, night sweats or profuse sweating, burning sensation when voiding, cough, congestion, stuffiness, runny nose, sore throat, diarrhea, nausea, vomiting, cold or Flu symptoms, recent or current infections. It is specially important if the infection is over the area that we intend to treat. Heart and lung problems: Symptoms that may suggest an active cardiopulmonary problem include: cough, chest pain, breathing difficulties or shortness of breath, dizziness, ankle swelling, uncontrolled high or unusually low blood pressure, and/or palpitations. If you are experiencing any of these symptoms, cancel your procedure and contact your primary care physician for an evaluation.  Remember:  Regular Business hours are:  Monday to Thursday 8:00 AM to 4:00 PM  Provider's Schedule: Delano Metz, MD:  Procedure days: Tuesday and Thursday 7:30 AM to 4:00 PM  Edward Jolly, MD:  Procedure days: Monday and Wednesday 7:30 AM to 4:00 PM  Stop Eliquis 3 days before procedure. ______________________________________________________________________

## 2021-06-16 NOTE — Progress Notes (Signed)
PROVIDER NOTE: Information contained herein reflects review and annotations entered in association with encounter. Interpretation of such information and data should be left to medically-trained personnel. Information provided to patient can be located elsewhere in the medical record under "Patient Instructions". Document created using STT-dictation technology, any transcriptional errors that may result from process are unintentional.    Patient: Carmen [name]  Service Category: E/M  Provider: Gaspar Cola, MD  DOB: 1941/05/20  DOS: 06/16/2021  Specialty: Interventional Pain Management  MRN: [id number]  Setting: Ambulatory outpatient  PCP: Doreen Beam, FNP  Type: Established Patient    Referring Provider: Sharmon Leyden*  Location: Office  Delivery: Face-to-face     HPI  Carmen [name], a 80 y.o. 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 Carmen [name] primary complain today is Back Pain (lower) Last encounter: My last encounter with her was on 05/31/2021. Pertinent problems: Carmen [name] has Peripheral vascular disease ([place]); Other chronic pain; Chronic low back pain (1ry area of Pain) (Bilateral) (R>L) w/o sciatica; Dorsalgia, unspecified; Repeated falls; Chronic pain syndrome; Cervicalgia; Chronic neck pain (2ry area of Pain) (Bilateral) (L>R); Chronic lower extremity pain (3ry area of Pain) (Bilateral) (L>R); Chronic thigh pain (Bilateral); Chronic hand pain (Left); Chronic hand pain (Right); Lumbar compression fracture (Old) (Multilevel), sequela; Chronic cervical radiculopathy (Bilateral); DDD (degenerative disc disease), cervical; Foraminal stenosis of cervical region; Lumbar facet joint syndrome (Bilateral); Lumbosacral facet hypertrophy (Multilevel) (Bilateral); Lumbar facet arthropathy (Multilevel) (Bilateral); 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 Pain Assessment: Severity of Chronic pain is reported as a 3 /10. Location: Back Lower/both legs to the feet. Onset: More than a month ago. Quality: Nagging. Timing: Intermittent. Modifying factor(s): heatinig pad, Biofreeze. Vitals:  height is 5' 1"  (1.549 m) and weight is 180 lb (81.6 kg). Her temporal temperature is 96.3 F (35.7 C) (abnormal). Her blood pressure is 110/84 and her pulse is 89. Her respiration is 18 and oxygen saturation is 98%.   Reason for encounter: both, medication management and post-procedure assessment.   The patient indicates doing well with the current medication regimen. No adverse reactions or side effects reported to the medications.  The patient refers having attained 100% relief of the pain for the duration of local anesthetic.  The patient also indicates that she is still enjoying nearly 100% relief of her low back pain and the only area that she is still having some discomfort is in the area of her hips and thighs.  For now she refers doing well and not needing a repeat procedure for the lower back.  Today we went over the results of her CT and MRI for the lumbar and cervical regions.  I have reminded them that once the pain starts to return in the lower back we can do a second diagnostic injection and if she again gets excellent relief of the pain for the duration of the local anesthetic, but no long-term benefit, we may consider the possibility of radiofrequency ablation for the lumbar facets.  In terms of the cervical spine, the patient refers having most of the pain in the left side of the neck  with radiation towards the shoulder  and upper arm.  Based on the cervical MRI, it would seem that she also has cervical facet hypertrophy at multiple levels as well as some foraminal stenosis.  However her main symptoms seem to be those of a cervical facet syndrome.  I have offered to do a diagnostic left cervical facet block under fluoroscopic guidance.  They have indicated that they are interested in doing that.  RTCB: 10/29/2021  Post-Procedure Evaluation  Procedure (05/31/2021):  Procedure:           Anesthesia, Analgesia, Anxiolysis:  Type: Lumbar Facet, Medial Branch Block(s)          Primary Purpose: Diagnostic Region: Posterolateral Lumbosacral Spine Level: L2, L3, L4, L5, & S1 Medial Branch Level(s). Injecting these levels blocks the L3-4, L4-5, and L5-S1 lumbar facet joints. Laterality: Bilateral   Type: Minimal (Conscious) Anxiolysis combined with Local Anesthesia Indication(s): Analgesia and Anxiety Route: Intravenous (IV) IV Access: Secured Sedation: Meaningful verbal contact was maintained at all times during the procedure  Local Anesthetic: Lidocaine 1-2%   Position: Prone    Indications: 1. Lumbar facet joint syndrome (Bilateral)   2. Lumbar facet arthropathy (Multilevel) (Bilateral)   3. Spondylosis without myelopathy or radiculopathy, lumbosacral region   4. DDD (degenerative disc disease), lumbosacral   5. Other chronic pain   6. 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 Chronic anticoagulation (Eliquis)   8. Encounter for therapeutic procedure     Pain Score: Pre-procedure: 5 /10 Post-procedure: 0-No pain/10   Anxiolysis: Anxiolysis provided  Effectiveness during initial hour after procedure (Ultra-Short Term Relief): 100 %.  Local anesthetic used: Long-acting (4-6 hours) Effectiveness: Defined as any analgesic benefit obtained secondary to the administration of local anesthetics. This carries significant diagnostic value as to  the etiological location, or anatomical origin, of the pain. Duration of benefit is expected to coincide with the duration of the local anesthetic used.  Effectiveness during initial 4-6 hours after procedure (Short-Term Relief): 100 %.  Long-term benefit: Defined as any relief past the pharmacologic duration of the local anesthetics.  Effectiveness past the initial 6 hours after procedure (Long-Term Relief): 50 %.  Benefits, current: Defined as benefit present at the time of this evaluation.   Analgesia: The patient refers having obtained an ongoing 50% relief of the pain.  However, for the duration of the local anesthetic she had absolutely no pain in the treated area. Function: Carmen [name] reports improvement in function ROM: Carmen [name] reports improvement in ROM  Pharmacotherapy Assessment  Analgesic: Hydrocodone/APAP 5/325, 1 tab p.o. BID (10 mg/day of hydrocodone) (10 MME) (last filled on 05/02/2021) MME/day: 10 mg/day   Monitoring: [place] PMP: PDMP reviewed during this encounter.       Pharmacotherapy: No side-effects or adverse reactions reported. Compliance: No problems identified. Effectiveness: Clinically acceptable.  No notes on file  UDS:  Summary  Date Value Ref Range Status  03/14/2021 Note  Final    Comment:    ==================================================================== Compliance Drug Analysis, Ur ==================================================================== Test                             Result       Flag       Units  Drug Present and Declared for Prescription Verification   Hydrocodone                    900  EXPECTED   ng/mg creat   Hydromorphone                  196          EXPECTED   ng/mg creat   Dihydrocodeine                 166          EXPECTED   ng/mg creat   Norhydrocodone                 1484         EXPECTED   ng/mg creat    Sources of hydrocodone include scheduled prescription medications.    Hydromorphone, dihydrocodeine and  norhydrocodone are expected    metabolites of hydrocodone. Hydromorphone and dihydrocodeine are    also available as scheduled prescription medications.    Trazodone                      PRESENT      EXPECTED   1,3 chlorophenyl piperazine    PRESENT      EXPECTED    1,3-chlorophenyl piperazine is an expected metabolite of trazodone.    Acetaminophen                  PRESENT      EXPECTED   Metoprolol                     PRESENT      EXPECTED  Drug Present not Declared for Prescription Verification   Ephedrine/Pseudoephedrine      PRESENT      UNEXPECTED   Phenylpropanolamine            PRESENT      UNEXPECTED    Source of ephedrine/pseudoephedrine is most commonly pseudoephedrine    in over-the-counter or prescription cold and allergy medications.    Phenylpropanolamine is an expected metabolite of    ephedrine/pseudoephedrine.    Guaifenesin                    PRESENT      UNEXPECTED    Guaifenesin may be administered as an over-the-counter or    prescription drug; it may also be present as a breakdown product of    methocarbamol.  Drug Absent but Declared for Prescription Verification   Clonazepam                     Not Detected UNEXPECTED ng/mg creat   Salicylate                     Not Detected UNEXPECTED    Aspirin, as indicated in the declared medication list, is not always    detected even when used as directed.  ==================================================================== Test                      Result    Flag   Units      Ref Range   Creatinine              56               mg/dL      >=20 ==================================================================== Declared Medications:  The flagging and interpretation on this report are based on the  following declared medications.  Unexpected results may arise from  inaccuracies in the declared medications.   **Note: The testing scope of  this panel includes these medications:   Clonazepam (Klonopin)   Hydrocodone (Norco)  Metoprolol (Toprol)  Trazodone (Desyrel)   **Note: The testing scope of this panel does not include small to  moderate amounts of these reported medications:   Acetaminophen (Norco)  Aspirin   **Note: The testing scope of this panel does not include the  following reported medications:   Amlodipine (Lotrel)  Apixaban (Eliquis)  Atorvastatin (Lipitor)  Benazepril (Lotrel)  Cyanocobalamin  Fluconazole (Diflucan)  Levothyroxine (Synthroid)  Nystatin  Triamcinolone ==================================================================== For clinical consultation, please call [phone]. ====================================================================      ROS  Constitutional: Denies any fever or chills Gastrointestinal: No reported hemesis, hematochezia, vomiting, or acute GI distress Musculoskeletal: Denies any acute onset joint swelling, redness, loss of ROM, or weakness Neurological: No reported episodes of acute onset apraxia, aphasia, dysarthria, agnosia, amnesia, paralysis, loss of coordination, or loss of consciousness  Medication Review  HYDROcodone-acetaminophen, Vitamin D3, amLODipine-benazepril, apixaban, aspirin EC, atorvastatin, calcium carbonate, cetirizine, clonazePAM, gabapentin, levothyroxine, metoprolol succinate, and traZODone  History Review  Allergy: Carmen [name] has No Known Allergies. Drug: Carmen [name]  reports no history of drug use. Alcohol:  reports that she does not currently use alcohol. Tobacco:  reports that she quit smoking about 13 months ago. Her smoking use included cigarettes. She has never used smokeless tobacco. Social: Carmen [name]  reports that she quit smoking about 13 months ago. Her smoking use included cigarettes. She has never used smokeless tobacco. She reports that she does not currently use alcohol. She reports that she does not use drugs. Medical:  has a past medical history of Allergy, Anemia, Anxiety, Arthritis,  Back pain, Coronary artery disease, Depression, Hypertension, Peripheral vascular disease ([place]), Stroke ([place]), and Thyroid disease. Surgical: Carmen [name]  has a past surgical history that includes Cardiac catheterization; Coronary angioplasty; Coronary artery bypass graft; and back injections. Family: family history includes Bipolar disorder in her daughter; Breast cancer in her mother; Heart attack in her father.  Laboratory Chemistry Profile   Renal Lab Results  Component Value Date   BUN 24 (H) 03/14/2021   CREATININE 0.86 03/14/2021   BCR 13 11/22/2020   GFRAA 73 11/22/2020   GFRNONAA >60 03/14/2021    Hepatic Lab Results  Component Value Date   AST 23 03/14/2021   ALT 19 03/14/2021   ALBUMIN 4.9 03/14/2021   ALKPHOS 60 03/14/2021   AMMONIA 19 10/06/2020    Electrolytes Lab Results  Component Value Date   NA 135 03/14/2021   K 4.5 03/14/2021   CL 103 03/14/2021   CALCIUM 9.5 03/14/2021   MG 1.9 03/14/2021    Bone Lab Results  Component Value Date   25OHVITD1 12 (L) 03/14/2021   25OHVITD2 <1.0 03/14/2021   25OHVITD3 12 03/14/2021    Inflammation (CRP: Acute Phase) (ESR: Chronic Phase) Lab Results  Component Value Date   CRP 0.8 03/14/2021   ESRSEDRATE 4 03/14/2021         Note: Above Lab results reviewed.  Recent Imaging Review  DG PAIN CLINIC C-ARM 1-60 MIN NO REPORT Fluoro was used, but no Radiologist interpretation will be provided.  Please refer to "NOTES" tab for provider progress note. Note: Reviewed        Physical Exam  General appearance: Well nourished, well developed, and well hydrated. In no apparent acute distress Mental status: Alert, oriented x 3 (person, place, & time)       Respiratory: No evidence of acute respiratory distress Eyes: PERLA Vitals: BP 110/84  Pulse 89   Temp (!) 96.3 F (35.7 C) (Temporal)   Resp 18   Ht 5' 1"  (1.549 m)   Wt 180 lb (81.6 kg)   SpO2 98%   BMI 34.01 kg/m  BMI: Estimated body mass index is 34.01  kg/m as calculated from the following:   Height as of this encounter: 5' 1"  (1.549 m).   Weight as of this encounter: 180 lb (81.6 kg). Ideal: Ideal body weight: 47.8 kg (105 lb 6.1 oz) Adjusted ideal body weight: 61.3 kg (135 lb 3.6 oz)  Assessment   Status Diagnosis  Improved Stable Improving 1. Lumbar facet joint syndrome (Bilateral)   2. Lumbar compression fracture (Old) (Multilevel), sequela   3. Chronic low back pain (1ry area of Pain) (Bilateral) (R>L) w/o sciatica   4. Chronic neck pain (2ry area of Pain) (Bilateral) (L>R)   5. Chronic lower extremity pain (3ry area of Pain) (Bilateral) (L>R)   6. DDD (degenerative disc disease), lumbosacral   7. DDD (degenerative disc disease), cervical   8. Abnormal CT scan, lumbar spine (05/11/2021)   9. Abnormal MRI, cervical spine (05/12/2021)   10. Chronic pain syndrome   11. Chronic low back pain (1ry area of Pain) (Bilateral) (L>R) w/o sciatica   12. Pharmacologic therapy   13. Chronic use of opiate for therapeutic purpose   14. Encounter for medication management   15. Chronic anticoagulation (Eliquis)   16. Cervicalgia   17. Cervical facet syndrome (Bilateral)   18. Cervical facet hypertrophy (Multilevel) (Bilateral)      Updated Problems: No problems updated.  Plan of Care  Problem-specific:  No problem-specific Assessment & Plan notes found for this encounter.  Carmen [name] has a current medication list which includes the following long-term medication(s): amlodipine-benazepril, apixaban, calcium carbonate, cetirizine, clonazepam, gabapentin, [START ON 07/01/2021] hydrocodone-acetaminophen, [START ON 07/31/2021] hydrocodone-acetaminophen, metoprolol succinate, trazodone, atorvastatin, [START ON 08/30/2021] hydrocodone-acetaminophen, and [START ON 09/29/2021] hydrocodone-acetaminophen.  Pharmacotherapy (Medications Ordered): Meds ordered this encounter  Medications   HYDROcodone-acetaminophen (NORCO/VICODIN) 5-325 MG  tablet    Sig: Take 1 tablet by mouth 2 (two) times daily as needed for severe pain. Must last 30 days.    Dispense:  60 tablet    Refill:  0    Not a duplicate. Do NOT delete! Dispense 1 day early if closed on fill date. Warn not to take CNS-depressants 8 hours before or after taking opioid. Do not send refill request. Renewal requires appointment.   HYDROcodone-acetaminophen (NORCO/VICODIN) 5-325 MG tablet    Sig: Take 1 tablet by mouth 2 (two) times daily as needed for severe pain. Must last 30 days.    Dispense:  60 tablet    Refill:  0    Not a duplicate. Do NOT delete! Dispense 1 day early if closed on fill date. Warn not to take CNS-depressants 8 hours before or after taking opioid. Do not send refill request. Renewal requires appointment.    Orders:  Orders Placed This Encounter  Procedures   CERVICAL FACET (MEDIAL BRANCH NERVE BLOCK)     Standing Status:   Future    Standing Expiration Date:   09/15/2021    Scheduling Instructions:     Side: Left-sided     Level: C3-4, C4-5, C5-6 Facet joints (C3, C4, C5, C6, & C7 Medial Branch Nerves)     Sedation: Patient's choice.     Timeframe: Ask the patient    Order Specific Question:   Where will this procedure be performed?  Answer:   [facility]   Informed Consent Details: Physician/Practitioner Attestation; Transcribe to consent form and obtain patient signature    Note: Always confirm laterality of pain with Ms. Mi, before procedure. Transcribe to consent form and obtain patient signature.    Order Specific Question:   Physician/Practitioner attestation of informed consent for procedure/surgical case    Answer:   I, the physician/practitioner, attest that I have discussed with the patient the benefits, risks, side effects, alternatives, likelihood of achieving goals and potential problems during recovery for the procedure that I have provided informed consent.    Order Specific Question:   Procedure    Answer:   Left  Cervical facet block under fluoroscopic guidance.    Order Specific Question:   Physician/Practitioner performing the procedure    Answer:   Darren Caldron A. Dossie Arbour, MD    Order Specific Question:   Indication/Reason    Answer:   Chronic neck pain secondary to cervical facet syndrome   Blood Thinner Instructions to Nursing    Always make sure patient has clearance from prescribing physician to stop blood thinners for interventional therapies. If the patient requires a Lovenox-bridge therapy, make sure arrangements are made to institute it with the assistance of the PCP.    Scheduling Instructions:     Have Ms. [place] stop the Eliquis (Apixaban) x 3 days prior to procedure or surgery.    Follow-up plan:   Return in about 4 months (around 10/29/2021) for Eval-day (M,W), (F2F), (MM), in addition, (Clinic) procedure: (L) C-FCT BLK #1.     Interventional Therapies  Risk  Complexity Considerations:   Estimated body mass index is 34.2 kg/m as calculated from the following:   Height as of this encounter: 5' 1"  (1.549 m).   Weight as of this encounter: 181 lb (82.1 kg). ELIQUIS + ASA Anticoagulation (Stop: 3 days  Restart: 6 hours)   Planned  Pending:      Under consideration:   Pending results of CT and MRI  Diagnostic/therapeutic cervical ESI  Diagnostic bilateral lumbar facet MBB #2    Completed:   Diagnostic bilateral lumbar facet MBB x1 (05/31/2021) (100/100/50/50)    Therapeutic  Palliative (PRN) options:   None established    Recent Visits Date Type Provider Dept  06/16/21 Office Visit Milinda Pointer, MD [facility]  05/31/21 Procedure visit Milinda Pointer, MD [facility]  05/17/21 Office Visit Milinda Pointer, MD [facility]  05/02/21 Office Visit Milinda Pointer, MD [facility]  Showing recent visits within past 90 days and meeting all other requirements Future Appointments Date Type Provider Dept  06/28/21 Appointment  Milinda Pointer, MD [facility]  08/17/21 Appointment Milinda Pointer, MD [facility]  Showing future appointments within next 90 days and meeting all other requirements I discussed the assessment and treatment plan with the patient. The patient was provided an opportunity to ask questions and all were answered. The patient agreed with the plan and demonstrated an understanding of the instructions.  Patient advised to call back or seek an in-person evaluation if the symptoms or condition worsens.  Duration of encounter: 30 minutes.  Note by: Gaspar Cola, MD Date: 06/16/2021; Time: 10:46 AM

## 2021-06-17 ENCOUNTER — Other Ambulatory Visit: Payer: Self-pay | Admitting: Physician Assistant

## 2021-06-17 DIAGNOSIS — E785 Hyperlipidemia, unspecified: Secondary | ICD-10-CM

## 2021-06-21 ENCOUNTER — Ambulatory Visit: Payer: Medicare Other | Admitting: Adult Health

## 2021-06-23 ENCOUNTER — Other Ambulatory Visit: Payer: Self-pay | Admitting: Pain Medicine

## 2021-06-23 DIAGNOSIS — M8580 Other specified disorders of bone density and structure, unspecified site: Secondary | ICD-10-CM

## 2021-06-27 NOTE — Progress Notes (Signed)
PROVIDER NOTE: Information contained herein reflects review and annotations entered in association with encounter. Interpretation of such information and data should be left to medically-trained personnel. Information provided to patient can be located elsewhere in the medical record under "Patient Instructions". Document created using STT-dictation technology, any transcriptional errors that may result from process are unintentional.    Patient: Carmen [name]  Service Category: Procedure  Provider: Oswaldo Done, MD  DOB: 08-14-1941  DOS: 06/28/2021  Location: [facility]  MRN: [id number]  Setting: Ambulatory - outpatient  Referring Provider: Stephanie Acre*  Type: Established Patient  Specialty: Interventional Pain Management  PCP: Berniece Pap, FNP   Primary Reason for Visit: Interventional Pain Management Treatment. CC: Neck Pain    Procedure:          Anesthesia, Analgesia, Anxiolysis:  Type: Cervical Facet Medial Branch Block(s)           Primary Purpose: Diagnostic Region: Posterolateral cervical spine Level: C3, C4, C5, C6, & C7 Medial Branch Level(s). Injecting these levels blocks the C3-4, C4-5, C5-6, and C6-7 cervical facet joints. Laterality: Left  Type: Local Anesthesia Local Anesthetic: Lidocaine 1-2% Sedation: Minimal Anxiolysis  Indication(s): Anxiety & Analgesia Route: Infiltration ([place]aboard/IM) IV Access: Available   Position: Prone with head of the table raised to facilitate breathing.   Indications: 1. Cervical facet syndrome (Bilateral)   2. Cervical facet hypertrophy (Multilevel) (Bilateral)   3. Cervicalgia   4. Spondylosis without myelopathy or radiculopathy, cervical region   5. DDD (degenerative disc disease), cervical   6. Chronic anticoagulation (Eliquis)    Pain Score: Pre-procedure: 5 /10 Post-procedure: 0-No pain/10   Note: After the procedure the patient indicated having a pressure sensation over the area of the  greater occipital nerve at the top of the head.  She also indicated that this is part of the pain that she normally has.  She confirmed having occipital headaches as well as headaches in the distribution of the greater occipital nerve.   Pre-op H&P Assessment:  Carmen [name] is a 80 y.o. (year old), female patient, seen today for interventional treatment. She  has a past surgical history that includes Cardiac catheterization; Coronary angioplasty; Coronary artery bypass graft; and back injections. Carmen [name] has a current medication list which includes the following prescription(s): amlodipine-benazepril, apixaban, aspirin ec, atorvastatin, calcium carbonate, cetirizine, vitamin d3, clonazepam, gabapentin, [START ON 07/01/2021] hydrocodone-acetaminophen, [START ON 07/31/2021] hydrocodone-acetaminophen, [START ON 08/30/2021] hydrocodone-acetaminophen, [START ON 09/29/2021] hydrocodone-acetaminophen, levothyroxine, metoprolol succinate, and trazodone, and the following Facility-Administered Medications: pentafluoroprop-tetrafluoroeth. Her primarily concern today is the Neck Pain  Initial Vital Signs:  Pulse/HCG Rate: (!) 43ECG Heart Rate: (!) 108 (atrial fib) Temp: (!) 96.8 F (36 C) Resp: 16 BP: (!) 145/94 SpO2: 100 %  BMI: Estimated body mass index is 34.01 kg/m as calculated from the following:   Height as of this encounter: 5\' 1"  (1.549 m).   Weight as of this encounter: 180 lb (81.6 kg).  Risk Assessment: Allergies: Reviewed. She has No Known Allergies.  Allergy Precautions: None required Coagulopathies: Reviewed. None identified.  Blood-thinner therapy: None at this time Active Infection(s): Reviewed. None identified. [name] is afebrile  Site Confirmation: Carmen [name] was asked to confirm the procedure and laterality before marking the site Procedure checklist: Completed Consent: Before the procedure and under the influence of no sedative(s), amnesic(s), or anxiolytics, the patient was  informed of the treatment options, risks and possible complications. To fulfill our ethical and legal obligations, as recommended by  the American Medical Association's Code of Ethics, I have informed the patient of my clinical impression; the nature and purpose of the treatment or procedure; the risks, benefits, and possible complications of the intervention; the alternatives, including doing nothing; the risk(s) and benefit(s) of the alternative treatment(s) or procedure(s); and the risk(s) and benefit(s) of doing nothing. The patient was provided information about the general risks and possible complications associated with the procedure. These may include, but are not limited to: failure to achieve desired goals, infection, bleeding, organ or nerve damage, allergic reactions, paralysis, and death. In addition, the patient was informed of those risks and complications associated to Spine-related procedures, such as failure to decrease pain; infection (i.e.: Meningitis, epidural or intraspinal abscess); bleeding (i.e.: epidural hematoma, subarachnoid hemorrhage, or any other type of intraspinal or peri-dural bleeding); organ or nerve damage (i.e.: Any type of peripheral nerve, nerve root, or spinal cord injury) with subsequent damage to sensory, motor, and/or autonomic systems, resulting in permanent pain, numbness, and/or weakness of one or several areas of the body; allergic reactions; (i.e.: anaphylactic reaction); and/or death. Furthermore, the patient was informed of those risks and complications associated with the medications. These include, but are not limited to: allergic reactions (i.e.: anaphylactic or anaphylactoid reaction(s)); adrenal axis suppression; blood sugar elevation that in diabetics may result in ketoacidosis or comma; water retention that in patients with history of congestive heart failure may result in shortness of breath, pulmonary edema, and decompensation with resultant heart  failure; weight gain; swelling or edema; medication-induced neural toxicity; particulate matter embolism and blood vessel occlusion with resultant organ, and/or nervous system infarction; and/or aseptic necrosis of one or more joints. Finally, the patient was informed that Medicine is not an exact science; therefore, there is also the possibility of unforeseen or unpredictable risks and/or possible complications that may result in a catastrophic outcome. The patient indicated having understood very clearly. We have given the patient no guarantees and we have made no promises. Enough time was given to the patient to ask questions, all of which were answered to the patient's satisfaction. Ms. Vanderweide has indicated that she wanted to continue with the procedure. Attestation: I, the ordering provider, attest that I have discussed with the patient the benefits, risks, side-effects, alternatives, likelihood of achieving goals, and potential problems during recovery for the procedure that I have provided informed consent. Date  Time: 06/28/2021  9:12 AM  Pre-Procedure Preparation:  Monitoring: As per clinic protocol. Respiration, ETCO2, SpO2, BP, heart rate and rhythm monitor placed and checked for adequate function Safety Precautions: Patient was assessed for positional comfort and pressure points before starting the procedure. Time-out: I initiated and conducted the "Time-out" before starting the procedure, as per protocol. The patient was asked to participate by confirming the accuracy of the "Time Out" information. Verification of the correct person, site, and procedure were performed and confirmed by me, the nursing staff, and the patient. "Time-out" conducted as per Joint Commission's Universal Protocol (UP.01.01.01). Time: 0954  Description of Procedure:          Laterality: Left Level: C3, C4, C5, C6, & C7 Medial Branch Level(s). Area Prepped: Posterior Cervico-thoracic Region DuraPrep (Iodine  Povacrylex [0.7% available iodine] and Isopropyl Alcohol, 74% w/w) Safety Precautions: Aspiration looking for blood return was conducted prior to all injections. At no point did we inject any substances, as a needle was being advanced. Before injecting, the patient was told to immediately notify me if she was experiencing any new onset of "ringing  in the ears, or metallic taste in the mouth". No attempts were made at seeking any paresthesias. Safe injection practices and needle disposal techniques used. Medications properly checked for expiration dates. SDV (single dose vial) medications used. After the completion of the procedure, all disposable equipment used was discarded in the proper designated medical waste containers. Local Anesthesia: Protocol guidelines were followed. The patient was positioned over the fluoroscopy table. The area was prepped in the usual manner. The time-out was completed. The target area was identified using fluoroscopy. A 12-in long, straight, sterile hemostat was used with fluoroscopic guidance to locate the targets for each level blocked. Once located, the skin was marked with an approved surgical skin marker. Once all sites were marked, the skin (epidermis, dermis, and hypodermis), as well as deeper tissues (fat, connective tissue and muscle) were infiltrated with a small amount of a short-acting local anesthetic, loaded on a 10cc syringe with a 25G, 1.5-in  Needle. An appropriate amount of time was allowed for local anesthetics to take effect before proceeding to the next step. Local Anesthetic: Lidocaine 2.0% The unused portion of the local anesthetic was discarded in the proper designated containers. Technical explanation of process:  C3 Medial Branch Nerve Block (MBB): The target area for the C3 dorsal medial articular branch is the lateral concave waist of the articular pillar of C3. Under fluoroscopic guidance, a Quincke needle was inserted until contact was made with os  over the postero-lateral aspect of the articular pillar of C3 (target area). After negative aspiration for blood, 0.5 mL of the nerve block solution was injected without difficulty or complication. The needle was removed intact. C4 Medial Branch Nerve Block (MBB): The target area for the C4 dorsal medial articular branch is the lateral concave waist of the articular pillar of C4. Under fluoroscopic guidance, a Quincke needle was inserted until contact was made with os over the postero-lateral aspect of the articular pillar of C4 (target area). After negative aspiration for blood, 0.5 mL of the nerve block solution was injected without difficulty or complication. The needle was removed intact. C5 Medial Branch Nerve Block (MBB): The target area for the C5 dorsal medial articular branch is the lateral concave waist of the articular pillar of C5. Under fluoroscopic guidance, a Quincke needle was inserted until contact was made with os over the postero-lateral aspect of the articular pillar of C5 (target area). After negative aspiration for blood, 0.5 mL of the nerve block solution was injected without difficulty or complication. The needle was removed intact. C6 Medial Branch Nerve Block (MBB): The target area for the C6 dorsal medial articular branch is the lateral concave waist of the articular pillar of C6. Under fluoroscopic guidance, a Quincke needle was inserted until contact was made with os over the postero-lateral aspect of the articular pillar of C6 (target area). After negative aspiration for blood, 0.5 mL of the nerve block solution was injected without difficulty or complication. The needle was removed intact. C7 Medial Branch Nerve Block (MBB): The target for the C7 dorsal medial articular branch lies on the superior-medial tip of the C7 transverse process. Under fluoroscopic guidance, a Quincke needle was inserted until contact was made with os over the postero-lateral aspect of the articular pillar  of C7 (target area). After negative aspiration for blood, 0.5 mL of the nerve block solution was injected without difficulty or complication. The needle was removed intact. Procedural Needles: 22-gauge, 3.5-inch, Quincke needles used for all levels. Nerve block solution: 0.2% PF-Ropivacaine +  Triamcinolone (40 mg/mL) diluted to a final concentration of 4 mg of Triamcinolone/mL of Ropivacaine The unused portion of the solution was discarded in the proper designated containers.  Once the entire procedure was completed, the treated area was cleaned, making sure to leave some of the prepping solution back to take advantage of its long term bactericidal properties.  Anatomy Reference Guide:       Vitals:   06/28/21 1003 06/28/21 1013 06/28/21 1023 06/28/21 1032  BP: (!) 161/118 (!) 170/103 (!) 150/97 (!) 151/101  Pulse:      Resp: 14 16 18 16   Temp:      TempSrc:      SpO2: 98% 99% 99% 99%  Weight:      Height:        Start Time: 0955 hrs. End Time: 1008 hrs.  Imaging Guidance (Spinal):          Type of Imaging Technique: Fluoroscopy Guidance (Spinal) Indication(s): Assistance in needle guidance and placement for procedures requiring needle placement in or near specific anatomical locations not easily accessible without such assistance. Exposure Time: Please see nurses notes. Contrast: None used. Fluoroscopic Guidance: I was personally present during the use of fluoroscopy. "Tunnel Vision Technique" used to obtain the best possible view of the target area. Parallax error corrected before commencing the procedure. "Direction-depth-direction" technique used to introduce the needle under continuous pulsed fluoroscopy. Once target was reached, antero-posterior, oblique, and lateral fluoroscopic projection used confirm needle placement in all planes. Images permanently stored in EMR. Interpretation: No contrast injected. I personally interpreted the imaging intraoperatively. Adequate needle  placement confirmed in multiple planes. Permanent images saved into the patient's record.  Antibiotic Prophylaxis:   Anti-infectives (From admission, onward)    None      Indication(s): None identified  Post-operative Assessment:  Post-procedure Vital Signs:  Pulse/HCG Rate: (!) 4381 Temp: (!) 96.8 F (36 C) Resp: 16 BP: (!) 151/101 SpO2: 99 %  EBL: None  Complications: No immediate post-treatment complications observed by team, or reported by patient.  Note: The patient tolerated the entire procedure well. A repeat set of vitals were taken after the procedure and the patient was kept under observation following institutional policy, for this type of procedure. Post-procedural neurological assessment was performed, showing return to baseline, prior to discharge. The patient was provided with post-procedure discharge instructions, including a section on how to identify potential problems. Should any problems arise concerning this procedure, the patient was given instructions to immediately contact , at any time, without hesitation. In any case, we plan to contact the patient by telephone for a follow-up status report regarding this interventional procedure.  Comments:  No additional relevant information.  Plan of Care  Orders:  Orders Placed This Encounter  Procedures   CERVICAL FACET (MEDIAL BRANCH NERVE BLOCK)     Scheduling Instructions:     Side: Left-sided     Level: C3-4, C4-5, C5-6 Facet joints (C3, C4, C5, C6, & C7 Medial Branch Nerves)     Sedation: Patient's choice.     Timeframe: Today    Order Specific Question:   Where will this procedure be performed?    Answer:   [facility]   [facility] C-ARM 1-60 MIN NO REPORT    Intraoperative interpretation by procedural physician at [facility].    Standing Status:   Standing    Number of Occurrences:   1    Order Specific Question:   Reason for exam:  Answer:   Assistance in needle guidance  and placement for procedures requiring needle placement in or near specific anatomical locations not easily accessible without such assistance.   Informed Consent Details: Physician/Practitioner Attestation; Transcribe to consent form and obtain patient signature    Note: Always confirm laterality of pain with Ms. Fettig, before procedure. Transcribe to consent form and obtain patient signature.    Order Specific Question:   Physician/Practitioner attestation of informed consent for procedure/surgical case    Answer:   I, the physician/practitioner, attest that I have discussed with the patient the benefits, risks, side effects, alternatives, likelihood of achieving goals and potential problems during recovery for the procedure that I have provided informed consent.    Order Specific Question:   Procedure    Answer:   Left Cervical facet block under fluoroscopic guidance.    Order Specific Question:   Physician/Practitioner performing the procedure    Answer:   Song Garris A. 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    Please confirm that the patient has stopped the Eliquis (Apixaban) x 3 days prior to procedure or surgery.    Standing Status:   Standing    Number of Occurrences:   1   Provide equipment / supplies at bedside    "Block Tray" (Disposable  single use) Needle type: SpinalSpinal Amount/quantity: 5 Size: Regular (3.5-inch) Gauge: 22G    Standing Status:   Standing    Number of Occurrences:   1    Order Specific Question:   Specify    Answer:   Block Tray   Bleeding precautions    Standing Status:   Standing    Number of Occurrences:   1    Chronic Opioid Analgesic:  Hydrocodone/APAP 5/325, 1 tab p.o. BID (10 mg/day of hydrocodone) (10 MME) (last filled on  05/02/2021) MME/day: 10 mg/day   Medications ordered for procedure: Meds ordered this encounter  Medications   lidocaine (XYLOCAINE) 2 % (with pres) injection 400 mg   pentafluoroprop-tetrafluoroeth (GEBAUERS) aerosol   lactated ringers infusion 1,000 mL   midazolam (VERSED) 5 MG/5ML injection 0.5-2 mg    Make sure Flumazenil is available in the pyxis when using this medication. If oversedation occurs, administer 0.2 mg IV over 15 sec. If after 45 sec no response, administer 0.2 mg again over 1 min; may repeat at 1 min intervals; not to exceed 4 doses (1 mg)   ropivacaine (PF) 2 mg/mL (0.2%) (NAROPIN) injection 9 mL   dexamethasone (DECADRON) injection 10 mg    Medications administered: We administered lidocaine, lactated ringers, midazolam, ropivacaine (PF) 2 mg/mL (0.2%), and dexamethasone.  See the medical record for exact dosing, route, and time of administration.  Follow-up plan:   Return in about 2 weeks (around 07/12/2021) for Proc-day (T,Th), (VV), (PPE).       Interventional Therapies  Risk  Complexity Considerations:   Estimated body mass index is 34.2 kg/m as calculated from the following:   Height as of this encounter: 5\' 1"  (1.549 m).   Weight as of this encounter: 181 lb (82.1 kg). ELIQUIS + ASA Anticoagulation (Stop: 3 days  Restart: 6 hours)   Planned  Pending:      Under consideration:   Pending results of CT and MRI  Diagnostic/therapeutic  cervical ESI  Diagnostic bilateral lumbar facet MBB #2    Completed:   Diagnostic bilateral lumbar facet MBB x1 (05/31/2021) (100/100/50/50)    Therapeutic  Palliative (PRN) options:   None established     Recent Visits Date Type Provider Dept  06/16/21 Office Visit Delano Metz, MD [facility]  05/31/21 Procedure visit Delano Metz, MD [facility]  05/17/21 Office Visit Delano Metz, MD [facility]  05/02/21 Office Visit Delano Metz, MD [facility]   Showing recent visits within past 90 days and meeting all other requirements Today's Visits Date Type Provider Dept  06/28/21 Procedure visit Delano Metz, MD [facility]  Showing today's visits and meeting all other requirements Future Appointments Date Type Provider Dept  07/12/21 Appointment Delano Metz, MD [facility]  08/17/21 Appointment Delano Metz, MD [facility]  Showing future appointments within next 90 days and meeting all other requirements Disposition: Discharge home  Discharge (Date  Time): 06/28/2021; 1035 hrs.   Primary Care Physician: Berniece Pap, FNP Location: [facility] Note by: Oswaldo Done, MD Date: 06/28/2021; Time: 11:40 AM  Disclaimer:  Medicine is not an Visual merchandiser. The only guarantee in medicine is that nothing is guaranteed. It is important to note that the decision to proceed with this intervention was based on the information collected from the patient. The Data and conclusions were drawn from the patient's questionnaire, the interview, and the physical examination. Because the information was provided in large part by the patient, it cannot be guaranteed that it has not been purposely or unconsciously manipulated. Every effort has been made to obtain as much relevant data as possible for this evaluation. It is important to note that the conclusions that lead to this procedure are derived in large part from the available data. Always take into account that the treatment will also be dependent on availability of resources and existing treatment guidelines, considered by other Pain Management Practitioners as being common knowledge and practice, at the time of the intervention. For Medico-Legal purposes, it is also important to point out that variation in procedural techniques and pharmacological choices are the acceptable norm. The indications, contraindications,  technique, and results of the above procedure should only be interpreted and judged by a Board-Certified Interventional Pain Specialist with extensive familiarity and expertise in the same exact procedure and technique.

## 2021-06-28 ENCOUNTER — Other Ambulatory Visit: Payer: Self-pay

## 2021-06-28 ENCOUNTER — Ambulatory Visit (HOSPITAL_BASED_OUTPATIENT_CLINIC_OR_DEPARTMENT_OTHER): Payer: Medicare Other | Admitting: Pain Medicine

## 2021-06-28 ENCOUNTER — Ambulatory Visit
Admission: RE | Admit: 2021-06-28 | Discharge: 2021-06-28 | Disposition: A | Discharge status: Home / Self Care | Payer: Medicare Other | Source: Ambulatory Visit | Attending: Pain Medicine | Admitting: Pain Medicine

## 2021-06-28 VITALS — BP 151/101 | HR 43 | Temp 96.8°F | Resp 16 | Ht 61.0 in | Wt 180.0 lb

## 2021-06-28 DIAGNOSIS — M47812 Spondylosis without myelopathy or radiculopathy, cervical region: Secondary | ICD-10-CM

## 2021-06-28 DIAGNOSIS — M5030 Other cervical disc degeneration, unspecified cervical region: Secondary | ICD-10-CM

## 2021-06-28 DIAGNOSIS — M47012 Anterior spinal artery compression syndromes, cervical region: Secondary | ICD-10-CM | POA: Diagnosis not present

## 2021-06-28 DIAGNOSIS — M542 Cervicalgia: Secondary | ICD-10-CM | POA: Diagnosis present

## 2021-06-28 DIAGNOSIS — Z7901 Long term (current) use of anticoagulants: Secondary | ICD-10-CM | POA: Diagnosis present

## 2021-06-28 MED ORDER — MIDAZOLAM HCL 5 MG/5ML IJ SOLN
0.5000 mg | Freq: Once | INTRAMUSCULAR | Status: AC
  Administered 2021-06-28: 1 mg via INTRAVENOUS
  Filled 2021-06-28: qty 5

## 2021-06-28 MED ORDER — ROPIVACAINE HCL 2 MG/ML IJ SOLN
9.0000 mL | Freq: Once | INTRAMUSCULAR | Status: AC
  Administered 2021-06-28: 20 mL via PERINEURAL

## 2021-06-28 MED ORDER — ROPIVACAINE HCL 2 MG/ML IJ SOLN
INTRAMUSCULAR | Status: AC
  Filled 2021-06-28: qty 20

## 2021-06-28 MED ORDER — PENTAFLUOROPROP-TETRAFLUOROETH EX AERO
INHALATION_SPRAY | Freq: Once | CUTANEOUS | Status: DC
  Filled 2021-06-28: qty 116

## 2021-06-28 MED ORDER — DEXAMETHASONE SODIUM PHOSPHATE 10 MG/ML IJ SOLN
10.0000 mg | Freq: Once | INTRAMUSCULAR | Status: AC
  Administered 2021-06-28: 10 mg
  Filled 2021-06-28: qty 1

## 2021-06-28 MED ORDER — LIDOCAINE HCL 2 % IJ SOLN
20.0000 mL | Freq: Once | INTRAMUSCULAR | Status: AC
  Administered 2021-06-28: 400 mg
  Filled 2021-06-28: qty 20

## 2021-06-28 MED ORDER — LACTATED RINGERS IV SOLN
1000.0000 mL | Freq: Once | INTRAVENOUS | Status: AC
  Administered 2021-06-28: 1000 mL via INTRAVENOUS

## 2021-06-28 NOTE — Patient Instructions (Addendum)
____________________________________________________________________________________________  Post-Procedure Discharge Instructions  Instructions: Apply ice:  Purpose: This will minimize any swelling and discomfort after procedure.  When: Day of procedure, as soon as you get home. How: Fill a plastic sandwich bag with crushed ice. Cover it with a small towel and apply to injection site. How long: (15 min on, 15 min off) Apply for 15 minutes then remove x 15 minutes.  Repeat sequence on day of procedure, until you go to bed. Apply heat:  Purpose: To treat any soreness and discomfort from the procedure. When: Starting the next day after the procedure. How: Apply heat to procedure site starting the day following the procedure. How long: May continue to repeat daily, until discomfort goes away. Food intake: Start with clear liquids (like water) and advance to regular food, as tolerated.  Physical activities: Keep activities to a minimum for the first 8 hours after the procedure. After that, then as tolerated. Driving: If you have received any sedation, be responsible and do not drive. You are not allowed to drive for 24 hours after having sedation. Blood thinner: (Applies only to those taking blood thinners) You may restart your blood thinner 6 hours after your procedure. Insulin: (Applies only to Diabetic patients taking insulin) As soon as you can eat, you may resume your normal dosing schedule. Infection prevention: Keep procedure site clean and dry. Shower daily and clean area with soap and water. Post-procedure Pain Diary: Extremely important that this be done correctly and accurately. Recorded information will be used to determine the next step in treatment. For the purpose of accuracy, follow these rules: Evaluate only the area treated. Do not report or include pain from an untreated area. For the purpose of this evaluation, ignore all other areas of pain, except for the treated area. After  your procedure, avoid taking a long nap and attempting to complete the pain diary after you wake up. Instead, set your alarm clock to go off every hour, on the hour, for the initial 8 hours after the procedure. Document the duration of the numbing medicine, and the relief you are getting from it. Do not go to sleep and attempt to complete it later. It will not be accurate. If you received sedation, it is likely that you were given a medication that may cause amnesia. Because of this, completing the diary at a later time may cause the information to be inaccurate. This information is needed to plan your care. Follow-up appointment: Keep your post-procedure follow-up evaluation appointment after the procedure (usually 2 weeks for most procedures, 6 weeks for radiofrequencies). DO NOT FORGET to bring you pain diary with you.   Expect: (What should I expect to see with my procedure?) From numbing medicine (AKA: Local Anesthetics): Numbness or decrease in pain. You may also experience some weakness, which if present, could last for the duration of the local anesthetic. Onset: Full effect within 15 minutes of injected. Duration: It will depend on the type of local anesthetic used. On the average, 1 to 8 hours.  From steroids (Applies only if steroids were used): Decrease in swelling or inflammation. Once inflammation is improved, relief of the pain will follow. Onset of benefits: Depends on the amount of swelling present. The more swelling, the longer it will take for the benefits to be seen. In some cases, up to 10 days. Duration: Steroids will stay in the system x 2 weeks. Duration of benefits will depend on multiple posibilities including persistent irritating factors. Side-effects: If present, they  may typically last 2 weeks (the duration of the steroids). Frequent: Cramps (if they occur, drink Gatorade and take over-the-counter Magnesium 450-500 mg once to twice a day); water retention with temporary  weight gain; increases in blood sugar; decreased immune system response; increased appetite. Occasional: Facial flushing (red, warm cheeks); mood swings; menstrual changes. Uncommon: Long-term decrease or suppression of natural hormones; bone thinning. (These are more common with higher doses or more frequent use. This is why we prefer that our patients avoid having any injection therapies in other practices.)  Very Rare: Severe mood changes; psychosis; aseptic necrosis. From procedure: Some discomfort is to be expected once the numbing medicine wears off. This should be minimal if ice and heat are applied as instructed.  Call if: (When should I call?) You experience numbness and weakness that gets worse with time, as opposed to wearing off. New onset bowel or bladder incontinence. (Applies only to procedures done in the spine)  Emergency Numbers: Durning business hours (Monday - Thursday, 8:00 AM - 4:00 PM) (Friday, 9:00 AM - 12:00 Noon): [phone] After hours: [phone] NOTE: If you are having a problem and are unable connect with, or to talk to a provider, then go to your nearest urgent care or emergency department. If the problem is serious and urgent, please call 911. You may restart your Eliquis tomorrow. ____________________________________________________________________________________________

## 2021-06-28 NOTE — Progress Notes (Signed)
Safety precautions to be maintained throughout the outpatient stay will include: orient to surroundings, keep bed in low position, maintain call bell within reach at all times, provide assistance with transfer out of bed and ambulation.  

## 2021-06-29 ENCOUNTER — Telehealth: Payer: Medicare Other

## 2021-06-29 ENCOUNTER — Telehealth: Payer: Self-pay

## 2021-06-29 ENCOUNTER — Telehealth: Payer: Self-pay | Admitting: *Deleted

## 2021-06-29 NOTE — Telephone Encounter (Signed)
Post procedure phone call.  Patients daughter sttates she is doing very good. Marland Kitchen

## 2021-06-29 NOTE — Telephone Encounter (Signed)
  Care Management   Follow Up Note   06/29/2021 Name: Carmen [name] MRN: [id number] DOB: Feb 01, 1941   Referred by: Berniece Pap, FNP Reason for referral : Chronic Care Management (Pain, Falls)   An unsuccessful telephone outreach was attempted today. The patient was referred to the case management team for assistance with care management and care coordination.   Follow Up Plan: RNCM will seek assistance from Care Guides in rescheduling appointment within the next 30 days.  Rhae Lerner RN, MSN RN Care Management Coordinator [facility] [phone] [email or web address]

## 2021-07-04 NOTE — Telephone Encounter (Signed)
Pt has been rescheduled. 

## 2021-07-11 ENCOUNTER — Other Ambulatory Visit: Payer: Self-pay

## 2021-07-11 MED ORDER — APIXABAN 5 MG PO TABS: 5.0000 mg | ORAL_TABLET | Freq: Two times a day (BID) | ORAL | 6 refills | Status: DC

## 2021-07-11 NOTE — Progress Notes (Signed)
Patient: Carmen [name]  Service Category: E/M  Provider: Gaspar Cola, MD  DOB: 11/29/1940  DOS: 07/12/2021  Location: Office  MRN: [id number]  Setting: Ambulatory outpatient  Referring Provider: Sharmon Leyden*  Type: Established Patient  Specialty: Interventional Pain Management  PCP: Carmen Beam, FNP  Location: Remote location  Delivery: TeleHealth     Virtual Encounter - Pain Management PROVIDER NOTE: Information contained herein reflects review and annotations entered in association with encounter. Interpretation of such information and data should be left to medically-trained personnel. Information provided to patient can be located elsewhere in the medical record under "Patient Instructions". Document created using STT-dictation technology, any transcriptional errors that may result from process are unintentional.    Contact & Pharmacy Preferred: [phone] Home: [phone] (home) Mobile: [phone] (mobile) E-mail: [email or web address]  [id number]Phone: [phone]: [phone] [address] [place] [address]Phone: [phone]: [phone]  Pre-screening  [name] offered "in-person" vs "virtual" encounter. She indicated preferring virtual for this encounter.   Reason COVID-19*  Social distancing based on CDC and AMA recommendations.   I contacted Carmen [name] on 07/12/2021 via telephone.      I clearly identified myself as FGaspar Cola MD. I verified that I was speaking with the correct person using two identifiers (Name: IBreenaMinor, and date of birth: 327-Aug-1942.  Consent I sought verbal advanced consent from IGaylordfor virtual visit interactions. I informed [name] of possible security and privacy concerns, risks, and limitations associated with providing  "not-in-person" medical evaluation and management services. I also informed Ms. Antonucci of the availability of "in-person" appointments. Finally, I informed her that there would be a charge for the virtual visit and that she could be  personally, fully or partially, financially responsible for it. [name] expressed understanding and agreed to proceed.   Historic Elements   [name] is a 80y.o. year old, female patient evaluated today after our last contact on 06/28/2021. [name]  has a past medical history of Allergy, Anemia, Anxiety, Arthritis, Back pain, Coronary artery disease, Depression, Hypertension, Peripheral vascular disease ([place], Stroke ([place], and Thyroid disease. She also  has a past surgical history that includes Cardiac catheterization; Coronary angioplasty; Coronary artery bypass graft; and back injections. [name] has a current medication list which includes the following prescription(s): amlodipine-benazepril, apixaban, aspirin ec, atorvastatin, calcium carbonate, cetirizine, vitamin d3, clonazepam, gabapentin, hydrocodone-acetaminophen, [START ON 07/31/2021] hydrocodone-acetaminophen, [START ON 08/30/2021] hydrocodone-acetaminophen, [START ON 09/29/2021] hydrocodone-acetaminophen, levothyroxine, metoprolol succinate, and trazodone. She  reports that she quit smoking about 14 months ago. Her smoking use included cigarettes. She has never used smokeless tobacco. She reports that she does not currently use alcohol. She reports that she does not use drugs. [name] has No Known Allergies.   HPI  Today, she is being contacted for a post-procedure assessment.  Today I called and spoke to the patient's daughter.  I was unable to speak directly to [name] since we ended up calling late secondary to the problems that we experienced during the day with our schedule and the delays.  The information that I got back from the daughter was that the patient did not feel "thrilled" with the  neck injection, as she did with the lower back.  They denied having attained good relief of the pain for the duration  of the local anesthetic, despite the fact that our documentation indicated that by the time she left the clinic she was having no pain.  Post-Procedure Evaluation  Procedure (06/28/2021):  Procedure:           Anesthesia, Analgesia, Anxiolysis:  Type: Cervical Facet Medial Branch Block(s)           Primary Purpose: Diagnostic Region: Posterolateral cervical spine Level: C3, C4, C5, C6, & C7 Medial Branch Level(s). Injecting these levels blocks the C3-4, C4-5, C5-6, and C6-7 cervical facet joints. Laterality: Left   Type: Local Anesthesia Local Anesthetic: Lidocaine 1-2% Sedation: Minimal Anxiolysis  Indication(s): Anxiety & Analgesia Route: Infiltration ([place]/IM) IV Access: Available     Position: Prone with head of the table raised to facilitate breathing.    Indications: 1. Cervical facet syndrome (Bilateral)   2. Cervical facet hypertrophy (Multilevel) (Bilateral)   3. Cervicalgia   4. Spondylosis without myelopathy or radiculopathy, cervical region   5. DDD (degenerative disc disease), cervical   6. Chronic anticoagulation (Eliquis)     Pain Score: Pre-procedure: 5 /10 Post-procedure: 0-No pain/10    Note: After the procedure the patient indicated having a pressure sensation over the area of the greater occipital nerve at the top of the head.  She also indicated that this is part of the pain that she normally has.  She confirmed having occipital headaches as well as headaches in the distribution of the greater occipital nerve.  Anxiolysis: Anxiolysis provided  Effectiveness during initial hour after procedure (Ultra-Short Term Relief): 25 %.  Local anesthetic used: Long-acting (4-6 hours) Effectiveness: Defined as any analgesic benefit obtained secondary to the administration of local anesthetics. This carries significant diagnostic value as to the etiological  location, or anatomical origin, of the pain. Duration of benefit is expected to coincide with the duration of the local anesthetic used.  Effectiveness during initial 4-6 hours after procedure (Short-Term Relief): 25 %.  Long-term benefit: Defined as any relief past the pharmacologic duration of the local anesthetics.  Effectiveness past the initial 6 hours after procedure (Long-Term Relief): 0 % (daughter reports that patient's neck is still really bothering here.  not much relief at all.).  Benefits, current: Defined as benefit present at the time of this evaluation.   Analgesia: No significant benefit according to the patient's daughter. Function: Back to baseline ROM: Back to baseline   Pharmacotherapy Assessment   Analgesic: Hydrocodone/APAP 5/325, 1 tab p.o. BID (10 mg/day of hydrocodone) (10 MME) (last filled on 05/02/2021) MME/day: 10 mg/day   Monitoring: [place] PMP: PDMP reviewed during this encounter.       Pharmacotherapy: No side-effects or adverse reactions reported. Compliance: No problems identified. Effectiveness: Clinically acceptable. Plan: Refer to "POC". UDS:  Summary  Date Value Ref Range Status  03/14/2021 Note  Final    Comment:    ==================================================================== Compliance Drug Analysis, Ur ==================================================================== Test                             Result       Flag       Units  Drug Present and Declared for Prescription Verification   Hydrocodone                    900          EXPECTED   ng/mg creat   Hydromorphone  196          EXPECTED   ng/mg creat   Dihydrocodeine                 166          EXPECTED   ng/mg creat   Norhydrocodone                 1484         EXPECTED   ng/mg creat    Sources of hydrocodone include scheduled prescription medications.    Hydromorphone, dihydrocodeine and norhydrocodone are expected    metabolites of hydrocodone. Hydromorphone  and dihydrocodeine are    also available as scheduled prescription medications.    Trazodone                      PRESENT      EXPECTED   1,3 chlorophenyl piperazine    PRESENT      EXPECTED    1,3-chlorophenyl piperazine is an expected metabolite of trazodone.    Acetaminophen                  PRESENT      EXPECTED   Metoprolol                     PRESENT      EXPECTED  Drug Present not Declared for Prescription Verification   Ephedrine/Pseudoephedrine      PRESENT      UNEXPECTED   Phenylpropanolamine            PRESENT      UNEXPECTED    Source of ephedrine/pseudoephedrine is most commonly pseudoephedrine    in over-the-counter or prescription cold and allergy medications.    Phenylpropanolamine is an expected metabolite of    ephedrine/pseudoephedrine.    Guaifenesin                    PRESENT      UNEXPECTED    Guaifenesin may be administered as an over-the-counter or    prescription drug; it may also be present as a breakdown product of    methocarbamol.  Drug Absent but Declared for Prescription Verification   Clonazepam                     Not Detected UNEXPECTED ng/mg creat   Salicylate                     Not Detected UNEXPECTED    Aspirin, as indicated in the declared medication list, is not always    detected even when used as directed.  ==================================================================== Test                      Result    Flag   Units      Ref Range   Creatinine              56               mg/dL      >=20 ==================================================================== Declared Medications:  The flagging and interpretation on this report are based on the  following declared medications.  Unexpected results may arise from  inaccuracies in the declared medications.   **Note: The testing scope of this panel includes these medications:   Clonazepam (Klonopin)  Hydrocodone (Norco)  Metoprolol (Toprol)  Trazodone (Desyrel)   **Note: The  testing scope   of this panel does not include small to  moderate amounts of these reported medications:   Acetaminophen ([place])  Aspirin   **Note: The testing scope of this panel does not include the  following reported medications:   Amlodipine (Lotrel)  Apixaban (Eliquis)  Atorvastatin (Lipitor)  Benazepril (Lotrel)  Cyanocobalamin  Fluconazole (Diflucan)  Levothyroxine (Synthroid)  Nystatin  Triamcinolone ==================================================================== For clinical consultation, please call [phone]. ====================================================================      Laboratory Chemistry Profile   Renal Lab Results  Component Value Date   BUN 24 (H) 03/14/2021   CREATININE 0.86 03/14/2021   BCR 13 11/22/2020   GFRAA 73 11/22/2020   GFRNONAA >60 03/14/2021    Hepatic Lab Results  Component Value Date   AST 23 03/14/2021   ALT 19 03/14/2021   ALBUMIN 4.9 03/14/2021   ALKPHOS 60 03/14/2021   AMMONIA 19 10/06/2020    Electrolytes Lab Results  Component Value Date   NA 135 03/14/2021   K 4.5 03/14/2021   CL 103 03/14/2021   CALCIUM 9.5 03/14/2021   MG 1.9 03/14/2021    Bone Lab Results  Component Value Date   25OHVITD1 12 (L) 03/14/2021   25OHVITD2 <1.0 03/14/2021   25OHVITD3 12 03/14/2021    Inflammation (CRP: Acute Phase) (ESR: Chronic Phase) Lab Results  Component Value Date   CRP 0.8 03/14/2021   ESRSEDRATE 4 03/14/2021         Note: Above Lab results reviewed.  Imaging  DG PAIN CLINIC C-ARM 1-60 MIN NO REPORT Fluoro was used, but no Radiologist interpretation will be provided.  Please refer to "NOTES" tab for provider progress note.  Assessment  The primary encounter diagnosis was Cervical facet syndrome (Bilateral). Diagnoses of Cervical facet hypertrophy (Multilevel) (Bilateral), Cervicalgia, Spondylosis without myelopathy or radiculopathy, cervical region, and Chronic anticoagulation (Eliquis) were also  pertinent to this visit.  Plan of Care  Problem-specific:  No problem-specific Assessment & Plan notes found for this encounter.  Ms. Ravin Desmarais has a current medication list which includes the following long-term medication(s): amlodipine-benazepril, apixaban, atorvastatin, calcium carbonate, cetirizine, clonazepam, gabapentin, hydrocodone-acetaminophen, [START ON 07/31/2021] hydrocodone-acetaminophen, [START ON 08/30/2021] hydrocodone-acetaminophen, [START ON 09/29/2021] hydrocodone-acetaminophen, metoprolol succinate, and trazodone.  Pharmacotherapy (Medications Ordered): No orders of the defined types were placed in this encounter.  Orders:  No orders of the defined types were placed in this encounter.  Follow-up plan:   No follow-ups on file.     Interventional Therapies  Risk  Complexity Considerations:   Estimated body mass index is 34.2 kg/m as calculated from the following:   Height as of this encounter: 5' 1" (1.549 m).   Weight as of this encounter: 181 lb (82.1 kg). ELIQUIS + ASA Anticoagulation (Stop: 3 days  Restart: 6 hours)   Planned  Pending:      Under consideration:   Pending results of CT and MRI  Diagnostic/therapeutic cervical ESI  Diagnostic bilateral lumbar facet MBB #2    Completed:   Diagnostic bilateral lumbar facet MBB x1 (05/31/2021) (100/100/50/50)    Therapeutic  Palliative (PRN) options:   None established    Recent Visits Date Type Provider Dept  06/28/21 Procedure visit , , MD [facility]  06/16/21 Office Visit , , MD [facility]  05/31/21 Procedure visit , , MD [facility]  05/17/21 Office Visit , , MD [facility]  05/02/21 Office Visit , , MD [facility]  Showing recent visits within past 90 days and meeting all other   requirements Today's Visits Date Type Provider Dept  07/12/21 Office Visit Milinda Pointer, MD [facility]  Showing today's visits and meeting all other requirements Future Appointments Date Type Provider Dept  08/17/21 Appointment Milinda Pointer, MD [facility]  Showing future appointments within next 90 days and meeting all other requirements I discussed the assessment and treatment plan with the patient. The patient was provided an opportunity to ask questions and all were answered. The patient agreed with the plan and demonstrated an understanding of the instructions.  Patient advised to call back or seek an in-person evaluation if the symptoms or condition worsens.  Duration of encounter: 13 minutes.  Note by: Gaspar Cola, MD Date: 07/12/2021; Time: 5:37 PM

## 2021-07-11 NOTE — Telephone Encounter (Signed)
Eliquis 5 mg refill request received. Patient is 80  years old, weight-81.6 kg, Crea- 0.86 on 03/14/21, Diagnosis-PAF, and last seen by Eula Listen, PA on 03/24/21. Dose is appropriate based on dosing criteria. Will send in refill to requested pharmacy.

## 2021-07-12 ENCOUNTER — Other Ambulatory Visit: Payer: Self-pay

## 2021-07-12 ENCOUNTER — Ambulatory Visit: Payer: Medicare Other | Attending: Pain Medicine | Admitting: Pain Medicine

## 2021-07-12 DIAGNOSIS — M542 Cervicalgia: Secondary | ICD-10-CM | POA: Diagnosis not present

## 2021-07-12 DIAGNOSIS — M47812 Spondylosis without myelopathy or radiculopathy, cervical region: Secondary | ICD-10-CM | POA: Diagnosis not present

## 2021-07-12 DIAGNOSIS — Z7901 Long term (current) use of anticoagulants: Secondary | ICD-10-CM | POA: Diagnosis not present

## 2021-07-19 ENCOUNTER — Other Ambulatory Visit: Payer: Self-pay

## 2021-07-19 MED ORDER — APIXABAN 5 MG PO TABS: 5.0000 mg | ORAL_TABLET | Freq: Two times a day (BID) | ORAL | 1 refills | Status: DC

## 2021-07-19 NOTE — Telephone Encounter (Signed)
*  STAT* If patient is at the pharmacy, call can be transferred to refill team.   1. Which medications need to be refilled? (please list name of each medication and dose if known) Eliquis 2. Which pharmacy/location (including street and city if local pharmacy) is medication to be sent to? [facility]  3. Do they need a 30 day or 90 day supply? 90 

## 2021-07-19 NOTE — Telephone Encounter (Signed)
Prescription refill request for Eliquis received. Indication:afib Last office visit:ryan dunn 03/24/21 Scr:0.86 03/14/21 Age: 16f Weight:81.6kg

## 2021-07-20 ENCOUNTER — Telehealth: Payer: Self-pay | Admitting: Adult Health

## 2021-07-26 ENCOUNTER — Encounter: Payer: Self-pay | Admitting: Family

## 2021-07-26 ENCOUNTER — Other Ambulatory Visit: Payer: Self-pay

## 2021-07-26 ENCOUNTER — Ambulatory Visit (INDEPENDENT_AMBULATORY_CARE_PROVIDER_SITE_OTHER): Payer: Medicare Other | Admitting: Family

## 2021-07-26 VITALS — BP 124/82 | HR 78 | Temp 95.3°F | Ht 60.98 in | Wt 185.8 lb

## 2021-07-26 DIAGNOSIS — G4700 Insomnia, unspecified: Secondary | ICD-10-CM | POA: Diagnosis not present

## 2021-07-26 DIAGNOSIS — E538 Deficiency of other specified B group vitamins: Secondary | ICD-10-CM | POA: Diagnosis not present

## 2021-07-26 DIAGNOSIS — E785 Hyperlipidemia, unspecified: Secondary | ICD-10-CM | POA: Diagnosis not present

## 2021-07-26 DIAGNOSIS — I25810 Atherosclerosis of coronary artery bypass graft(s) without angina pectoris: Secondary | ICD-10-CM

## 2021-07-26 MED ORDER — TRAZODONE HCL 100 MG PO TABS: 100.0000 mg | ORAL_TABLET | Freq: Every evening | ORAL | 1 refills | Status: DC | PRN

## 2021-07-27 LAB — CBC WITH DIFFERENTIAL/PLATELET
Basophils Absolute: 0 10*3/uL (ref 0.0–0.1)
Basophils Relative: 0.8 % (ref 0.0–3.0)
Eosinophils Absolute: 0.1 10*3/uL (ref 0.0–0.7)
Eosinophils Relative: 1.9 % (ref 0.0–5.0)
HCT: 37.7 % (ref 36.0–46.0)
Hemoglobin: 12.4 g/dL (ref 12.0–15.0)
Lymphocytes Relative: 32.2 % (ref 12.0–46.0)
Lymphs Abs: 2 10*3/uL (ref 0.7–4.0)
MCHC: 33 g/dL (ref 30.0–36.0)
MCV: 89.9 fl (ref 78.0–100.0)
Monocytes Absolute: 0.6 10*3/uL (ref 0.1–1.0)
Monocytes Relative: 10.1 % (ref 3.0–12.0)
Neutro Abs: 3.4 10*3/uL (ref 1.4–7.7)
Neutrophils Relative %: 55 % (ref 43.0–77.0)
Platelets: 245 10*3/uL (ref 150.0–400.0)
RBC: 4.19 Mil/uL (ref 3.87–5.11)
RDW: 16.6 % — ABNORMAL HIGH (ref 11.5–15.5)
WBC: 6.1 10*3/uL (ref 4.0–10.5)

## 2021-07-27 LAB — VITAMIN B12: Vitamin B-12: 216 pg/mL (ref 211–911)

## 2021-07-27 LAB — COMPREHENSIVE METABOLIC PANEL
ALT: 25 U/L (ref 0–35)
AST: 20 U/L (ref 0–37)
Albumin: 4.2 g/dL (ref 3.5–5.2)
Alkaline Phosphatase: 41 U/L (ref 39–117)
BUN: 14 mg/dL (ref 6–23)
CO2: 24 mEq/L (ref 19–32)
Calcium: 9.2 mg/dL (ref 8.4–10.5)
Chloride: 105 mEq/L (ref 96–112)
Creatinine, Ser: 0.83 mg/dL (ref 0.40–1.20)
GFR: 66.98 mL/min (ref >60.00)
Glucose, Bld: 115 mg/dL — ABNORMAL HIGH (ref 70–99)
Potassium: 3.9 mEq/L (ref 3.5–5.1)
Sodium: 138 mEq/L (ref 135–145)
Total Bilirubin: 0.4 mg/dL (ref 0.2–1.2)
Total Protein: 6.5 g/dL (ref 6.0–8.3)

## 2021-07-27 LAB — LDL CHOLESTEROL, DIRECT: Direct LDL: 99 mg/dL

## 2021-07-27 LAB — LIPID PANEL
Cholesterol: 182 mg/dL (ref 0–200)
HDL: 50.1 mg/dL (ref >39.00)
NonHDL: 132.39
Total CHOL/HDL Ratio: 4
Triglycerides: 292 mg/dL — ABNORMAL HIGH (ref 0.0–149.0)
VLDL: 58.4 mg/dL — ABNORMAL HIGH (ref 0.0–40.0)

## 2021-07-27 LAB — TSH: TSH: 2.51 u[IU]/mL (ref 0.35–5.50)

## 2021-07-29 ENCOUNTER — Ambulatory Visit (INDEPENDENT_AMBULATORY_CARE_PROVIDER_SITE_OTHER): Payer: Medicare Other | Admitting: Pharmacist

## 2021-07-29 DIAGNOSIS — G4700 Insomnia, unspecified: Secondary | ICD-10-CM

## 2021-07-29 DIAGNOSIS — I739 Peripheral vascular disease, unspecified: Secondary | ICD-10-CM

## 2021-07-29 DIAGNOSIS — E785 Hyperlipidemia, unspecified: Secondary | ICD-10-CM

## 2021-07-29 DIAGNOSIS — Z8673 Personal history of transient ischemic attack (TIA), and cerebral infarction without residual deficits: Secondary | ICD-10-CM

## 2021-07-29 DIAGNOSIS — I480 Paroxysmal atrial fibrillation: Secondary | ICD-10-CM

## 2021-07-29 DIAGNOSIS — I25810 Atherosclerosis of coronary artery bypass graft(s) without angina pectoris: Secondary | ICD-10-CM

## 2021-07-29 NOTE — Patient Instructions (Signed)
Visit Information  PATIENT GOALS:  Goals Addressed               This Visit's Progress     Patient Stated     COMPLETED: Medication Monitoring (pt-stated)        Patient Goals/Self-Care Activities Over the next 90 days, patient will:  - take medications as prescribed check blood pressure periodically, document, and provide at future appointments collaborate with provider on medication access solutions        Patient verbalizes understanding of instructions provided today and agrees to view in MyChart.    Plan: Follow up with RN CM as scheduled  [name] Feliz Beam, PharmD, [place], CPP Clinical Pharmacist [employer] [facility] [phone]
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
## 2021-07-31 ENCOUNTER — Other Ambulatory Visit: Payer: Self-pay | Admitting: Pain Medicine

## 2021-07-31 DIAGNOSIS — E559 Vitamin D deficiency, unspecified: Secondary | ICD-10-CM

## 2021-07-31 DIAGNOSIS — M8580 Other specified disorders of bone density and structure, unspecified site: Secondary | ICD-10-CM

## 2021-07-31 NOTE — Progress Notes (Signed)
Acute Office Visit  Subjective:    Patient ID: Carmen [name], female    DOB: Jun 04, 1941, 80 y.o.   MRN: [id number]  Chief Complaint  Patient presents with  . Medication Dose Change    Pt wants trazodone dose changed. Pt states shes poke with Sharyn Lull about the change before she went on her leave.    HPI Patient is in today with c/o inability to sleep on 50 mg of Trazadone. She is requesting a dosage increase. She has inability to maintain sleep. She reports getting about 4 hours of sleep per night. Also requesting a flu shot. She has a history of CAD, HLD   Past Medical History:  Diagnosis Date  . Allergy   . Anemia   . Anxiety   . Arthritis   . Back pain   . Coronary artery disease   . Depression   . Hypertension   . Peripheral vascular disease ([place])   . Stroke ([place])   . Thyroid disease     Past Surgical History:  Procedure Laterality Date  . back injections    . CARDIAC CATHETERIZATION    . CORONARY ANGIOPLASTY    . CORONARY ARTERY BYPASS GRAFT      Family History  Problem Relation Age of Onset  . Breast cancer Mother   . Bipolar disorder Daughter   . Heart attack Father   . Diabetes Mellitus II Neg Hx     Social History   Socioeconomic History  . Marital status: Widowed    Spouse name: Not on file  . Number of children: Not on file  . Years of education: Not on file  . Highest education level: Not on file  Occupational History  . Not on file  Tobacco Use  . Smoking status: Former    Types: Cigarettes    Quit date: 05/2020    Years since quitting: 1.2  . Smokeless tobacco: Never  . Tobacco comments:    Smoked about 1 pack per month  Vaping Use  . Vaping Use: Never used  Substance and Sexual Activity  . Alcohol use: Not Currently  . Drug use: Never  . Sexual activity: Not on file  Other Topics Concern  . Not on file  Social History Narrative   ** Merged History Encounter **       Social Determinants of Health   Financial Resource Strain:  Low Risk   . Difficulty of Paying Living Expenses: Not hard at all  Food Insecurity: No Food Insecurity  . Worried About Charity fundraiser in the Last Year: Never true  . Ran Out of Food in the Last Year: Never true  Transportation Needs: No Transportation Needs  . Lack of Transportation (Medical): No  . Lack of Transportation (Non-Medical): No  Physical Activity: Not on file  Stress: Not on file  Social Connections: Not on file  Intimate Partner Violence: Not on file    Outpatient Medications Prior to Visit  Medication Sig Dispense Refill  . amLODipine-benazepril (LOTREL) 5-10 MG capsule TAKE 1 CAPSULE BY MOUTH EVERY DAY 90 capsule 0  . apixaban (ELIQUIS) 5 MG TABS tablet Take 1 tablet (5 mg total) by mouth 2 (two) times daily. 180 tablet 1  . aspirin EC 81 MG tablet Take 81 mg by mouth daily. Swallow whole.    Marland Kitchen atorvastatin (LIPITOR) 20 MG tablet TAKE 1 TABLET BY MOUTH EVERYDAY AT BEDTIME 90 tablet 0  . calcium carbonate (OSCAL) 1500 (600 Ca) MG TABS tablet  Take 1 tablet (1,500 mg total) by mouth 2 (two) times daily with a meal. 60 tablet 2  . cetirizine (ZYRTEC) 10 MG tablet Take 10 mg by mouth daily.    . Cholecalciferol (VITAMIN D3) 125 MCG (5000 UT) CAPS Take 1 capsule (5,000 Units total) by mouth daily with breakfast. Take along with calcium and magnesium. 30 capsule 2  . clonazePAM (KLONOPIN) 0.5 MG tablet Take 1 tablet (0.5 mg total) by mouth daily as needed for anxiety. 30 tablet 0  . gabapentin (NEURONTIN) 100 MG capsule Take 200 mg by mouth 2 (two) times daily.    Marland Kitchen HYDROcodone-acetaminophen (NORCO/VICODIN) 5-325 MG tablet Take 1 tablet by mouth 2 (two) times daily as needed for severe pain. Must last 30 days. 60 tablet 0  . HYDROcodone-acetaminophen (NORCO/VICODIN) 5-325 MG tablet Take 1 tablet by mouth 2 (two) times daily as needed for severe pain. Must last 30 days. 60 tablet 0  . [START ON 08/30/2021] HYDROcodone-acetaminophen (NORCO/VICODIN) 5-325 MG tablet Take 1  tablet by mouth 2 (two) times daily as needed for severe pain. Must last 30 days. 60 tablet 0  . [START ON 09/29/2021] HYDROcodone-acetaminophen (NORCO/VICODIN) 5-325 MG tablet Take 1 tablet by mouth 2 (two) times daily as needed for severe pain. Must last 30 days. 60 tablet 0  . levothyroxine (SYNTHROID) 100 MCG tablet Take 100 mcg by mouth daily before breakfast.    . metoprolol succinate (TOPROL-XL) 100 MG 24 hr tablet Take 50 mg by mouth daily.    . traZODone (DESYREL) 50 MG tablet Take 0.5 tablets (25 mg total) by mouth at bedtime as needed for sleep. 90 tablet 1   No facility-administered medications prior to visit.    No Known Allergies  Review of Systems  Psychiatric/Behavioral:  Positive for sleep disturbance.   All other systems reviewed and are negative.     Objective:    Physical Exam Vitals and nursing note reviewed.  Constitutional:      Appearance: Normal appearance.  Eyes:     Extraocular Movements: Extraocular movements intact.     Pupils: Pupils are equal, round, and reactive to light.  Cardiovascular:     Rate and Rhythm: Normal rate and regular rhythm.  Pulmonary:     Effort: Pulmonary effort is normal.  Abdominal:     General: Abdomen is flat.     Palpations: Abdomen is soft.  Musculoskeletal:        General: Normal range of motion.     Cervical back: Normal range of motion and neck supple.  Skin:    General: Skin is warm and dry.  Neurological:     General: No focal deficit present.     Mental Status: She is alert and oriented to person, place, and time.  Psychiatric:        Mood and Affect: Mood normal.        Behavior: Behavior normal.   BP 124/82   Pulse 78   Temp (!) 95.3 F (35.2 C)   Ht 5' 0.98" (1.549 m)   Wt 185 lb 12.8 oz (84.3 kg)   SpO2 96%   BMI 35.13 kg/m  Wt Readings from Last 3 Encounters:  07/26/21 185 lb 12.8 oz (84.3 kg)  06/28/21 180 lb (81.6 kg)  06/16/21 180 lb (81.6 kg)    Health Maintenance Due  Topic Date Due   . INFLUENZA VACCINE  Never done    There are no preventive care reminders to display for this patient.   Lab  Results  Component Value Date   TSH 2.51 07/26/2021   Lab Results  Component Value Date   WBC 6.1 07/26/2021   HGB 12.4 07/26/2021   HCT 37.7 07/26/2021   MCV 89.9 07/26/2021   PLT 245.0 07/26/2021   Lab Results  Component Value Date   NA 138 07/26/2021   K 3.9 07/26/2021   CO2 24 07/26/2021   GLUCOSE 115 (H) 07/26/2021   BUN 14 07/26/2021   CREATININE 0.83 07/26/2021   BILITOT 0.4 07/26/2021   ALKPHOS 41 07/26/2021   AST 20 07/26/2021   ALT 25 07/26/2021   PROT 6.5 07/26/2021   ALBUMIN 4.2 07/26/2021   CALCIUM 9.2 07/26/2021   ANIONGAP 9 03/14/2021   GFR 66.98 07/26/2021   Lab Results  Component Value Date   CHOL 182 07/26/2021   Lab Results  Component Value Date   HDL 50.10 07/26/2021   No results found for: [facility] Lab Results  Component Value Date   TRIG 292.0 (H) 07/26/2021   Lab Results  Component Value Date   CHOLHDL 4 07/26/2021   Lab Results  Component Value Date   HGBA1C 5.8 (H) 10/08/2020       Assessment & Plan:   Problem List Items Addressed This Visit   None Visit Diagnoses     Insomnia, unspecified type    -  Primary   Relevant Orders   Comp Met (CMET) (Completed)   TSH (Completed)   CBC w/Diff (Completed)   B12 (Completed)   Lipid Profile (Completed)   B12 deficiency       Relevant Orders   Comp Met (CMET) (Completed)   TSH (Completed)   CBC w/Diff (Completed)   B12 (Completed)   Lipid Profile (Completed)   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 traZODone (DESYREL) 100 MG tablet    Sig: Take 1 tablet (100 mg total) by mouth at bedtime as needed for sleep.    Dispense:  90 tablet    Refill:  1    Call the office is symptoms worsen or persist. Recheck as scheduled and sooner as needed.    Kennyth Arnold, FNP

## 2021-08-01 DIAGNOSIS — E785 Hyperlipidemia, unspecified: Secondary | ICD-10-CM | POA: Diagnosis not present

## 2021-08-01 DIAGNOSIS — I25810 Atherosclerosis of coronary artery bypass graft(s) without angina pectoris: Secondary | ICD-10-CM

## 2021-08-01 DIAGNOSIS — I480 Paroxysmal atrial fibrillation: Secondary | ICD-10-CM

## 2021-08-03 ENCOUNTER — Ambulatory Visit (INDEPENDENT_AMBULATORY_CARE_PROVIDER_SITE_OTHER): Payer: Medicare Other | Admitting: *Deleted

## 2021-08-03 ENCOUNTER — Telehealth: Payer: Medicare Other

## 2021-08-03 DIAGNOSIS — R296 Repeated falls: Secondary | ICD-10-CM

## 2021-08-03 DIAGNOSIS — I10 Essential (primary) hypertension: Secondary | ICD-10-CM

## 2021-08-03 DIAGNOSIS — G894 Chronic pain syndrome: Secondary | ICD-10-CM

## 2021-08-03 DIAGNOSIS — I739 Peripheral vascular disease, unspecified: Secondary | ICD-10-CM

## 2021-08-03 NOTE — Patient Instructions (Signed)
Visit Information  Patient verbalizes understanding of instructions provided today and agrees to view in MyChart.   The care management team will reach out to the patient again over the next 30 days.   Rhae Lerner RN, MSN RN Care Management Coordinator [facility] [phone] [email or web address]

## 2021-08-03 NOTE — Chronic Care Management (AMB) (Signed)
Chronic Care Management   CCM RN Visit Note  08/03/2021 Name: Carmen [name] MRN: [id number] DOB: 11/28/1940  Subjective: Carmen [name] is a 80 y.o. year old female who is a primary care patient of [name], Kelby Aline, FNP. The care management team was consulted for assistance with disease management and care coordination needs.    Engaged with patient by telephone for follow up visit in response to provider referral for case management and/or care coordination services.   Consent to Services:  The patient was given information about Chronic Care Management services, agreed to services, and gave verbal consent prior to initiation of services.  Please see initial visit note for detailed documentation.   Patient agreed to services and verbal consent obtained.   Assessment: Review of patient past medical history, allergies, medications, health status, including review of consultants reports, laboratory and other test data, was performed as part of comprehensive evaluation and provision of chronic care management services.   SDOH (Social Determinants of Health) assessments and interventions performed:    CCM Care Plan  No Known Allergies  Outpatient Encounter Medications as of 08/03/2021  Medication Sig   amLODipine-benazepril (LOTREL) 5-10 MG capsule TAKE 1 CAPSULE BY MOUTH EVERY DAY   apixaban (ELIQUIS) 5 MG TABS tablet Take 1 tablet (5 mg total) by mouth 2 (two) times daily.   aspirin EC 81 MG tablet Take 81 mg by mouth daily. Swallow whole.   atorvastatin (LIPITOR) 20 MG tablet TAKE 1 TABLET BY MOUTH EVERYDAY AT BEDTIME   traZODone (DESYREL) 100 MG tablet Take 1 tablet (100 mg total) by mouth at bedtime as needed for sleep.   calcium carbonate (OSCAL) 1500 (600 Ca) MG TABS tablet Take 1 tablet (1,500 mg total) by mouth 2 (two) times daily with a meal.   cetirizine (ZYRTEC) 10 MG tablet Take 10 mg by mouth daily.   clonazePAM (KLONOPIN) 0.5 MG tablet Take 1 tablet (0.5 mg total) by mouth  daily as needed for anxiety.   gabapentin (NEURONTIN) 100 MG capsule Take 200 mg by mouth 2 (two) times daily.   HYDROcodone-acetaminophen (NORCO/VICODIN) 5-325 MG tablet Take 1 tablet by mouth 2 (two) times daily as needed for severe pain. Must last 30 days.   HYDROcodone-acetaminophen (NORCO/VICODIN) 5-325 MG tablet Take 1 tablet by mouth 2 (two) times daily as needed for severe pain. Must last 30 days.   [START ON 08/30/2021] HYDROcodone-acetaminophen (NORCO/VICODIN) 5-325 MG tablet Take 1 tablet by mouth 2 (two) times daily as needed for severe pain. Must last 30 days.   [START ON 09/29/2021] HYDROcodone-acetaminophen (NORCO/VICODIN) 5-325 MG tablet Take 1 tablet by mouth 2 (two) times daily as needed for severe pain. Must last 30 days.   levothyroxine (SYNTHROID) 100 MCG tablet Take 100 mcg by mouth daily before breakfast.   metoprolol succinate (TOPROL-XL) 100 MG 24 hr tablet Take 50 mg by mouth daily.   No facility-administered encounter medications on file as of 08/03/2021.    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[place]) 05/17/2021   Cervical facet syndrome (Bilateral) 05/17/2021   Cervical facet hypertrophy (Multilevel) (Bilateral) 05/17/2021   Spondylosis without myelopathy or radiculopathy, cervical region 05/17/2021   Spondylosis without myelopathy or radiculopathy, lumbosacral region 05/17/2021   Other intervertebral disc degeneration, lumbar region 05/17/2021  Abnormal MRI, cervical spine (05/12/2021) 05/17/2021   Abnormal CT scan, lumbar spine (05/11/2021) 05/17/2021   Chronic cervical radiculopathy (Bilateral) 05/02/2021   DDD (degenerative disc disease), cervical 05/02/2021    Foraminal stenosis of cervical region 05/02/2021   Vitamin D deficiency 04/30/2021   Osteopenia determined by x-ray 04/30/2021   Lumbar compression fracture (Old) (Multilevel), sequela 04/30/2021   Chronic pain syndrome 03/14/2021   Pharmacologic therapy 03/14/2021   Disorder of skeletal system 03/14/2021   Problems influencing health status 03/14/2021   Cervicalgia 03/14/2021   Chronic neck pain (2ry area of Pain) (Bilateral) (L>R) 03/14/2021   Chronic lower extremity pain (3ry area of Pain) (Bilateral) (L>R) 03/14/2021   Chronic thigh pain (Bilateral) 03/14/2021   Chronic hand pain (Left) 03/14/2021   Chronic hand pain (Right) 03/14/2021   Chronic low back pain (1ry area of Pain) (Bilateral) (R>L) w/o sciatica 02/17/2021   Difficulty sleeping 02/17/2021   Dry skin dermatitis 02/03/2021   At risk for falls 02/03/2021   Athlete's foot on right 02/03/2021   Essential hypertension 11/26/2020   Falls frequently 11/18/2020   Paroxysmal atrial fibrillation ([place]) 11/18/2020   Repeated falls 11/18/2020   Acute encephalopathy 10/06/2020   CVA (cerebral vascular accident) ([place]) 10/06/2020   Atrial flutter ([facility]) 10/06/2020   Peripheral vascular disease ([place]) 10/06/2020   Depression, unspecified 10/02/2020   Acquired hypothyroidism 10/02/2020   Athscl heart disease of native coronary artery w/o ang pctrs 10/02/2020   Vitamin B deficiency, unspecified 10/02/2020   Other chronic pain 10/02/2020   Hyperlipidemia, unspecified 10/02/2020   Personal history of nicotine dependence 10/02/2020   Prsnl hx of TIA (TIA), and cereb infrc w/o resid deficits 10/02/2020   Anemia, unspecified 10/02/2020   Dorsalgia, unspecified 10/02/2020   History of stroke 10/02/2020   Chronic anticoagulation (Eliquis) 10/02/2020   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  Denies actual injuries with falls but lots of bruising.  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  [facility] in [facility] ([facility] and was receiving home health physical therapy.  Per daughter, patient continues to adjust well and becoming more comfortable at facility.  Has been more involved with facility activities. Patient has remained COVID free per daughter.  Using Rolator walker to ambulate; continues to be very active with her ambulation.  Daughter reports one fall in the last 2 months.  Daughter reports increase in swelling in ankles and feet since Sunday.  Swelling does not resolve with elevation or overnight.  Daughter to see patient tomorrow and encouraged to contact PCP for appointment if still with lower extremity edema.  Denies any increase in shortness of breath. Decreased adherence to prescribed treatment for fall prevention Clinical Goal(s):  patient will demonstrate improved adherence to prescribed treatment plan for decreasing falls as evidenced by patient reporting  and review of EMR patient will verbalize using fall risk reduction strategies discussed patient will not experience additional falls patient will verbalize understanding of plan for fall reduction Interventions:  Collaboration with [name], Kelby Aline, FNP regarding development and update of comprehensive plan of care as evidenced by provider attestation and  co-signature Inter-disciplinary care team collaboration (see longitudinal plan of care) Provided written and verbal education re: Potential causes of falls and Fall prevention strategies Reviewed medications and discussed potential side effects of medications such as dizziness and frequent urination Assessed for falls since last encounter. Assessed patients daughter's knowledge of fall risk prevention secondary to previously provided education. Assessed working status of patient call light system and patient adherence Encouraged continued participation in home health physical therapy Evaluation of current treatment plan related to falls and patient's adherence to plan as established by provider. Activities of daily living skills assessed Assistive or adaptive device use encouraged; encouraged to use Rolator walker at all times Barriers to physical activity or exercise identified and addressed; Barriers to safety identified Cognitive-stimulating activities promoted; encouraged to participate in group activities at facility as able Fall prevention plan reviewed and updated Fear of falling, loss of independence and pain acknowledged Medication list reviewed and compliance encouraged Discussed monitoring lower extremities for swelling and if swelling not reduced overnight or with elevation to contact primary care provider for appointment Patient Goals/Self-Care Deficits:  Always wear low-heeled or flat shoes or slippers with nonskid soles Keep my cell phone and call light with me always Learn how to get back up if I fall Make an emergency alert plan in case I fall Pick up clutter from the floors and use a nonslip pad with throw rugs, or remove them completely Use walker  with all ambulation Use a nightlight in the bathroom Continue to work with physical therapy  Monitor fluid in ankles and feet, contact provider for continued swelling Follow Up Plan: The care management team will reach out to  the patient again over the next 30 business days.      Care Plan : Chronic Pain (Adult)  Updates made by Leona Singleton, RN since 08/03/2021 12:00 AM     Problem: Chronic Pain Management (Chronic Pain)   Priority: Medium     Long-Range Goal: Chronic Pain Managed   Start Date: 02/25/2021  Expected End Date: 11/29/2021  This Visit's Progress: On track  Recent Progress: On track  Priority: Medium  Note:   Current Barriers:  Knowledge Deficits related to self-health management of chronic pain; daughter reports patient with chronic back pain and hand pain.  Reports taking pain medication and anxiety medication as prescribed.  Daughter reports patient doing better; feels pain and anxiety/mood is controlled and she feels patient is continuing to enjoy herself at facility.  States sleeping difficulties unchanged at this point Chronic Disease Management support and education needs related to chronic pain Lacks social connections Clinical Goal(s):  patient will verbalize understanding of plan for pain management. , patient will use pharmacological and nonpharmacological pain relief strategies as prescribed. , and patient will verbalize acceptable level of pain relief and ability to engage in desired activities Interventions:  Collaboration with [name], Kelby Aline, FNP regarding development and update of comprehensive plan of care as evidenced by provider attestation and co-signature Pain assessment performed Medications reviewed Discussed plans with patient for ongoing care management follow up and provided patient with direct contact information for care management team Evaluation of current treatment plan related to pain and depression and  patient's adherence to plan as established by provider. Social Work referral for mood and depression Discussed plans with patient for ongoing care management follow up and provided patient with direct contact information for care management team Careful  application of heat or ice encouraged Deep breathing, relaxation and mindfulness use promoted Effectiveness of pharmacologic therapy monitored Motivation and barriers to change assessed and addressed Participation in physical therapy encouraged and premedication prior to activity encouraged Attempted to determine if pain is associated with mobility or at rest, location, intensity, frequency, duration, recurrence, pattern and description (e.g., cramping, burning, aching), triggers and relieving factors Emotional support and empathy provided to daughter Discussed sleeping less during day to promote rest at night Patient Goals/Self Care Activities:  Learn relaxation techniques and use during pain Spend time with positive people; eat more in dining room Tell myself I can (not I can't) Think of new ways to do favorite things Use distraction techniques Continue to attend appointments with pain management provider Engage in more activities at [facility] Follow Up Plan: The care management team will reach out to the patient again over the next 30 business days.      Plan:The care management team will reach out to the patient again over the next 30 days.  Rhae Lerner RN, MSN RN Care Management Coordinator [facility] [phone] [email or web address]

## 2021-08-11 ENCOUNTER — Other Ambulatory Visit: Payer: Self-pay | Admitting: Adult Health

## 2021-08-11 ENCOUNTER — Other Ambulatory Visit: Payer: Self-pay | Admitting: Internal Medicine

## 2021-08-11 NOTE — Telephone Encounter (Signed)
RX Refill: klonopin Last Seen: 02-03-21 Last Ordered: 05-26-21 Next Appt: na

## 2021-08-16 NOTE — Progress Notes (Deleted)
PROVIDER NOTE: Information contained herein reflects review and annotations entered in association with encounter. Interpretation of such information and data should be left to medically-trained personnel. Information provided to patient can be located elsewhere in the medical record under "Patient Instructions". Document created using STT-dictation technology, any transcriptional errors that may result from process are unintentional.    Patient: Tito Dine Westley  Service Category: E/M  Provider: Gaspar Cola, MD  DOB: 1940/11/21  DOS: 08/17/2021  Specialty: Interventional Pain Management  MRN: [id number]  Setting: Ambulatory outpatient  PCP: Doreen Beam, FNP  Type: Established Patient    Referring Provider: Sharmon Leyden*  Location: Office  Delivery: Face-to-face     HPI  Ms. Raven Spallone, a 80 y.o. year old female, is here today because of her No primary diagnosis found.. Ms. Granville's primary complain today is No chief complaint on file. Last encounter: My last encounter with her was on 07/31/2021. Pertinent problems: Ms. Mcgahee has Peripheral vascular disease ([place]); Other chronic pain; Chronic low back pain (1ry area of Pain) (Bilateral) (R>L) w/o sciatica; Dorsalgia, unspecified; Repeated falls; Chronic pain syndrome; Cervicalgia; Chronic neck pain (2ry area of Pain) (Bilateral) (L>R); Chronic lower extremity pain (3ry area of Pain) (Bilateral) (L>R); Chronic thigh pain (Bilateral); Chronic hand pain (Left); Chronic hand pain (Right); Lumbar compression fracture (Old) (Multilevel), sequela; Chronic cervical radiculopathy (Bilateral); DDD (degenerative disc disease), cervical; Foraminal stenosis of cervical region; Lumbar facet joint syndrome (Bilateral); Lumbosacral facet hypertrophy (Multilevel) (Bilateral); Lumbar facet arthropathy (Multilevel) (Bilateral); 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 Pain Assessment: Severity of   is reported as a  /10. Location:    / . Onset:  . Quality:  . Timing:  . Modifying factor(s):  Marland Kitchen Vitals:  vitals were not taken for this visit.   Reason for encounter: medication management.  ***  RTCB:   Pharmacotherapy Assessment  Analgesic: Hydrocodone/APAP 5/325, 1 tab p.o. BID (10 mg/day of hydrocodone) (10 MME) (last filled on 05/02/2021) MME/day: 10 mg/day   Monitoring: [place] PMP: PDMP reviewed during this encounter.       Pharmacotherapy: No side-effects or adverse reactions reported. Compliance: No problems identified. Effectiveness: Clinically acceptable.  No notes on file  UDS:  Summary  Date Value Ref Range Status  03/14/2021 Note  Final    Comment:    ==================================================================== Compliance Drug Analysis, Ur ==================================================================== Test                             Result       Flag       Units  Drug Present and Declared for Prescription Verification   Hydrocodone                    900          EXPECTED   ng/mg creat   Hydromorphone                  196          EXPECTED   ng/mg creat   Dihydrocodeine                 166  EXPECTED   ng/mg creat   Norhydrocodone                 1484         EXPECTED   ng/mg creat    Sources of hydrocodone include scheduled prescription medications.    Hydromorphone, dihydrocodeine and norhydrocodone are expected    metabolites of hydrocodone. Hydromorphone and dihydrocodeine are    also available as scheduled prescription medications.     Trazodone                      PRESENT      EXPECTED   1,3 chlorophenyl piperazine    PRESENT      EXPECTED    1,3-chlorophenyl piperazine is an expected metabolite of trazodone.    Acetaminophen                  PRESENT      EXPECTED   Metoprolol                     PRESENT      EXPECTED  Drug Present not Declared for Prescription Verification   Ephedrine/Pseudoephedrine      PRESENT      UNEXPECTED   Phenylpropanolamine            PRESENT      UNEXPECTED    Source of ephedrine/pseudoephedrine is most commonly pseudoephedrine    in over-the-counter or prescription cold and allergy medications.    Phenylpropanolamine is an expected metabolite of    ephedrine/pseudoephedrine.    Guaifenesin                    PRESENT      UNEXPECTED    Guaifenesin may be administered as an over-the-counter or    prescription drug; it may also be present as a breakdown product of    methocarbamol.  Drug Absent but Declared for Prescription Verification   Clonazepam                     Not Detected UNEXPECTED ng/mg creat   Salicylate                     Not Detected UNEXPECTED    Aspirin, as indicated in the declared medication list, is not always    detected even when used as directed.  ==================================================================== Test                      Result    Flag   Units      Ref Range   Creatinine              56               mg/dL      >=20 ==================================================================== Declared Medications:  The flagging and interpretation on this report are based on the  following declared medications.  Unexpected results may arise from  inaccuracies in the declared medications.   **Note: The testing scope of this panel includes these medications:   Clonazepam (Klonopin)  Hydrocodone (Norco)  Metoprolol (Toprol)  Trazodone (Desyrel)   **Note: The testing scope of this panel does not include small to  moderate amounts of these reported  medications:   Acetaminophen (Norco)  Aspirin   **Note: The testing scope of this panel does not include the  following reported medications:   Amlodipine (Lotrel)  Apixaban (  Eliquis)  Atorvastatin (Lipitor)  Benazepril (Lotrel)  Cyanocobalamin  Fluconazole (Diflucan)  Levothyroxine (Synthroid)  Nystatin  Triamcinolone ==================================================================== For clinical consultation, please call [phone]. ====================================================================      ROS  Constitutional: Denies any fever or chills Gastrointestinal: No reported hemesis, hematochezia, vomiting, or acute GI distress Musculoskeletal: Denies any acute onset joint swelling, redness, loss of ROM, or weakness Neurological: No reported episodes of acute onset apraxia, aphasia, dysarthria, agnosia, amnesia, paralysis, loss of coordination, or loss of consciousness  Medication Review  HYDROcodone-acetaminophen, amLODipine-benazepril, apixaban, aspirin EC, atorvastatin, calcium carbonate, cetirizine, clonazePAM, gabapentin, levothyroxine, metoprolol succinate, and traZODone  History Review  Allergy: Ms. Dewald has No Known Allergies. Drug: Ms. Harbour  reports no history of drug use. Alcohol:  reports that she does not currently use alcohol. Tobacco:  reports that she quit smoking about 15 months ago. Her smoking use included cigarettes. She has never used smokeless tobacco. Social: Ms. Gerrard  reports that she quit smoking about 15 months ago. Her smoking use included cigarettes. She has never used smokeless tobacco. She reports that she does not currently use alcohol. She reports that she does not use drugs. Medical:  has a past medical history of Allergy, Anemia, Anxiety, Arthritis, Back pain, Coronary artery disease, Depression, Hypertension, Peripheral vascular disease ([place]), Stroke ([place]), and Thyroid disease. Surgical: Ms. Galdamez  has a past surgical history  that includes Cardiac catheterization; Coronary angioplasty; Coronary artery bypass graft; and back injections. Family: family history includes Bipolar disorder in her daughter; Breast cancer in her mother; Heart attack in her father.  Laboratory Chemistry Profile   Renal Lab Results  Component Value Date   BUN 14 07/26/2021   CREATININE 0.83 07/26/2021   BCR 13 11/22/2020   GFR 66.98 07/26/2021   GFRAA 73 11/22/2020   GFRNONAA >60 03/14/2021    Hepatic Lab Results  Component Value Date   AST 20 07/26/2021   ALT 25 07/26/2021   ALBUMIN 4.2 07/26/2021   ALKPHOS 41 07/26/2021   AMMONIA 19 10/06/2020    Electrolytes Lab Results  Component Value Date   NA 138 07/26/2021   K 3.9 07/26/2021   CL 105 07/26/2021   CALCIUM 9.2 07/26/2021   MG 1.9 03/14/2021    Bone Lab Results  Component Value Date   25OHVITD1 12 (L) 03/14/2021   25OHVITD2 <1.0 03/14/2021   25OHVITD3 12 03/14/2021    Inflammation (CRP: Acute Phase) (ESR: Chronic Phase) Lab Results  Component Value Date   CRP 0.8 03/14/2021   ESRSEDRATE 4 03/14/2021         Note: Above Lab results reviewed.  Recent Imaging Review  DG PAIN CLINIC C-ARM 1-60 MIN NO REPORT Fluoro was used, but no Radiologist interpretation will be provided.  Please refer to "NOTES" tab for provider progress note. Note: Reviewed        Physical Exam  General appearance: Well nourished, well developed, and well hydrated. In no apparent acute distress Mental status: Alert, oriented x 3 (person, place, & time)       Respiratory: No evidence of acute respiratory distress Eyes: PERLA Vitals: There were no vitals taken for this visit. BMI: Estimated body mass index is 35.13 kg/m as calculated from the following:   Height as of 07/26/21: 5' 0.98" (1.549 m).   Weight as of 07/26/21: 185 lb 12.8 oz (84.3 kg). Ideal: Patient weight not recorded  Assessment   Status Diagnosis  Controlled Controlled Controlled No diagnosis found.    Updated Problems: No problems updated.  Plan of Care  Problem-specific:  No problem-specific Assessment & Plan notes found for this encounter.  Ms. Anye Soeder has a current medication list which includes the following long-term medication(s): amlodipine-benazepril, apixaban, atorvastatin, calcium carbonate, cetirizine, clonazepam, gabapentin, hydrocodone-acetaminophen, hydrocodone-acetaminophen, [START ON 08/30/2021] hydrocodone-acetaminophen, [START ON 09/29/2021] hydrocodone-acetaminophen, metoprolol succinate, and trazodone.  Pharmacotherapy (Medications Ordered): No orders of the defined types were placed in this encounter.  Orders:  No orders of the defined types were placed in this encounter.  Follow-up plan:   No follow-ups on file.     Interventional Therapies  Risk  Complexity Considerations:   Estimated body mass index is 34.2 kg/m as calculated from the following:   Height as of this encounter: 5' 1"  (1.549 m).   Weight as of this encounter: 181 lb (82.1 kg). ELIQUIS + ASA Anticoagulation (Stop: 3 days  Restart: 6 hours)   Planned  Pending:      Under consideration:   Pending results of CT and MRI  Diagnostic/therapeutic cervical ESI  Diagnostic bilateral lumbar facet MBB #2    Completed:   Diagnostic bilateral lumbar facet MBB x1 (05/31/2021) (100/100/50/50)    Therapeutic  Palliative (PRN) options:   None established     Recent Visits Date Type Provider Dept  07/12/21 Office Visit Milinda Pointer, MD [facility]  06/28/21 Procedure visit Milinda Pointer, MD [facility]  06/16/21 Office Visit Milinda Pointer, MD [facility]  05/31/21 Procedure visit Milinda Pointer, MD [facility]  Showing recent visits within past 90 days and meeting all other requirements Future Appointments Date Type Provider Dept  08/17/21 Appointment Milinda Pointer, [facility]  10/24/21 Appointment  Milinda Pointer, MD [facility]  Showing future appointments within next 90 days and meeting all other requirements I discussed the assessment and treatment plan with the patient. The patient was provided an opportunity to ask questions and all were answered. The patient agreed with the plan and demonstrated an understanding of the instructions.  Patient advised to call back or seek an in-person evaluation if the symptoms or condition worsens.  Duration of encounter: *** minutes.  Note by: Gaspar Cola, MD Date: 08/17/2021; Time: 1:05 PM

## 2021-08-17 ENCOUNTER — Encounter: Payer: Medicare Other | Admitting: Pain Medicine

## 2021-08-24 ENCOUNTER — Ambulatory Visit: Payer: Medicare Other | Admitting: *Deleted

## 2021-08-24 DIAGNOSIS — G8929 Other chronic pain: Secondary | ICD-10-CM

## 2021-08-24 DIAGNOSIS — Z9181 History of falling: Secondary | ICD-10-CM

## 2021-08-24 DIAGNOSIS — R296 Repeated falls: Secondary | ICD-10-CM

## 2021-08-24 DIAGNOSIS — I10 Essential (primary) hypertension: Secondary | ICD-10-CM

## 2021-08-24 DIAGNOSIS — G894 Chronic pain syndrome: Secondary | ICD-10-CM

## 2021-08-24 NOTE — Patient Instructions (Addendum)
Visit Information  Thank you for taking time to visit with me today. Please don't hesitate to contact me if I can be of assistance to you before our next scheduled telephone appointment.  Following are the goals we discussed today:  Take all medications as prescribed Attend all scheduled provider appointments Call provider office for new concerns or questions  Continue to monitor lower extremities for swelling Contact provider for swelling not resolved over night  Our next appointment is by telephone on 09/30/21 at 1300  Please call the care guide team at [phone] if you need to cancel or reschedule your appointment.   Please call the Suicide and Crisis Lifeline: 988 call the [place] National Suicide Prevention Lifeline: (858) 835-4861 or TTY: 919-586-8562 TTY (226)410-7343) to talk to a trained counselor call 1-800-273-TALK (toll free, 24 hour hotline) call 911 if you are experiencing a Mental Health or Behavioral Health Crisis or need someone to talk to.  Patient verbalizes understanding of instructions provided today and agrees to view in MyChart.   Rhae Lerner RN, MSN RN Care Management Coordinator [facility] [phone] [email or web address]

## 2021-08-28 NOTE — Chronic Care Management (AMB) (Signed)
Chronic Care Management   CCM RN Visit Note  08/28/2021 Name: Carmen [name] MRN: [id number] DOB: 1941/08/22  Subjective: Carmen [name] is a 80 y.o. year old female who is a primary care patient of [name], [name], FNP. The care management team was consulted for assistance with disease management and care coordination needs.    Engaged with patient by telephone for follow up visit in response to provider referral for case management and/or care coordination services.   Consent to Services:  The patient was given information about Chronic Care Management services, agreed to services, and gave verbal consent prior to initiation of services.  Please see initial visit note for detailed documentation.   Patient agreed to services and verbal consent obtained.   Assessment: Review of patient past medical history, allergies, medications, health status, including review of consultants reports, laboratory and other test data, was performed as part of comprehensive evaluation and provision of chronic care management services.   SDOH (Social Determinants of Health) assessments and interventions performed:    CCM Care Plan  No Known Allergies  Outpatient Encounter Medications as of 08/24/2021  Medication Sig   amLODipine-benazepril (LOTREL) 5-10 MG capsule TAKE 1 CAPSULE BY MOUTH EVERY DAY   apixaban (ELIQUIS) 5 MG TABS tablet Take 1 tablet (5 mg total) by mouth 2 (two) times daily.   aspirin EC 81 MG tablet Take 81 mg by mouth daily. Swallow whole.   atorvastatin (LIPITOR) 20 MG tablet TAKE 1 TABLET BY MOUTH EVERYDAY AT BEDTIME   calcium carbonate (OSCAL) 1500 (600 Ca) MG TABS tablet Take 1 tablet (1,500 mg total) by mouth 2 (two) times daily with a meal.   cetirizine (ZYRTEC) 10 MG tablet Take 10 mg by mouth daily.   clonazePAM (KLONOPIN) 0.5 MG tablet TAKE 1 TABLET BY MOUTH EVERY DAY AS NEEDED FOR ANXIETY   gabapentin (NEURONTIN) 100 MG capsule Take 200 mg by mouth 2 (two) times daily.    HYDROcodone-acetaminophen (NORCO/VICODIN) 5-325 MG tablet Take 1 tablet by mouth 2 (two) times daily as needed for severe pain. Must last 30 days.   HYDROcodone-acetaminophen (NORCO/VICODIN) 5-325 MG tablet Take 1 tablet by mouth 2 (two) times daily as needed for severe pain. Must last 30 days.   [START ON 08/30/2021] HYDROcodone-acetaminophen (NORCO/VICODIN) 5-325 MG tablet Take 1 tablet by mouth 2 (two) times daily as needed for severe pain. Must last 30 days.   [START ON 09/29/2021] HYDROcodone-acetaminophen (NORCO/VICODIN) 5-325 MG tablet Take 1 tablet by mouth 2 (two) times daily as needed for severe pain. Must last 30 days.   levothyroxine (SYNTHROID) 100 MCG tablet Take 100 mcg by mouth daily before breakfast.   metoprolol succinate (TOPROL-XL) 100 MG 24 hr tablet Take 50 mg by mouth daily.   traZODone (DESYREL) 100 MG tablet Take 1 tablet (100 mg total) by mouth at bedtime as needed for sleep.   No facility-administered encounter medications on file as of 08/24/2021.    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/17/2021   Cervical facet syndrome (Bilateral) 05/17/2021   Cervical facet hypertrophy (Multilevel) (Bilateral) 05/17/2021   Spondylosis without myelopathy or radiculopathy, cervical region 05/17/2021   Spondylosis without myelopathy or radiculopathy, lumbosacral region 05/17/2021   Other intervertebral disc degeneration, lumbar region 05/17/2021  Abnormal MRI, cervical spine (05/12/2021) 05/17/2021   Abnormal CT scan, lumbar spine (05/11/2021) 05/17/2021   Chronic cervical radiculopathy (Bilateral) 05/02/2021   DDD (degenerative disc disease), cervical 05/02/2021    Foraminal stenosis of cervical region 05/02/2021   Vitamin D deficiency 04/30/2021   Osteopenia determined by x-ray 04/30/2021   Lumbar compression fracture (Old) (Multilevel), sequela 04/30/2021   Chronic pain syndrome 03/14/2021   Pharmacologic therapy 03/14/2021   Disorder of skeletal system 03/14/2021   Problems influencing health status 03/14/2021   Cervicalgia 03/14/2021   Chronic neck pain (2ry area of Pain) (Bilateral) (L>R) 03/14/2021   Chronic lower extremity pain (3ry area of Pain) (Bilateral) (L>R) 03/14/2021   Chronic thigh pain (Bilateral) 03/14/2021   Chronic hand pain (Left) 03/14/2021   Chronic hand pain (Right) 03/14/2021   Chronic low back pain (1ry area of Pain) (Bilateral) (R>L) w/o sciatica 02/17/2021   Difficulty sleeping 02/17/2021   Dry skin dermatitis 02/03/2021   At risk for falls 02/03/2021   Athlete's foot on right 02/03/2021   Essential hypertension 11/26/2020   Falls frequently 11/18/2020   Paroxysmal atrial fibrillation ([place]) 11/18/2020   Repeated falls 11/18/2020   Acute encephalopathy 10/06/2020   CVA (cerebral vascular accident) ([place]) 10/06/2020   Atrial flutter ([place]) 10/06/2020   Peripheral vascular disease ([place]) 10/06/2020   Depression, unspecified 10/02/2020   Acquired hypothyroidism 10/02/2020   Athscl heart disease of native coronary artery w/o ang pctrs 10/02/2020   Vitamin B deficiency, unspecified 10/02/2020   Other chronic pain 10/02/2020   Hyperlipidemia, unspecified 10/02/2020   Personal history of nicotine dependence 10/02/2020   Prsnl hx of TIA (TIA), and cereb infrc w/o resid deficits 10/02/2020   Anemia, unspecified 10/02/2020   Dorsalgia, unspecified 10/02/2020   History of stroke 10/02/2020   Chronic anticoagulation (Eliquis) 10/02/2020   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   Presence of coronary angioplasty implant and graft 10/02/2020    Conditions to be addressed/monitored: Pain and Falls  Care Plan :  [place] (Adult)  Updates made by Leona Singleton, RN since 08/28/2021 12:00 AM     Problem: Knowledge deficit related to swlf care maagement  of chronic medical comditions   Priority: Medium     Long-Range Goal: Patient's daughter will work with CCM team to gather knowledge to better manage mothers chronic mecical conditions   Start Date: 08/24/2021  Expected End Date: 08/24/2022  Priority: Medium  Note:   Current Barriers:  Knowledge Deficits related to plan of care for management of Pain and Falls  Care Coordination needs related to Level of care concerns   Patient continues to reside in ALF.  Per daughter patient continues to adjust to facility well and is involved with facility activities.  Daughter reports she did not have to take patient to provider due to swelling going away by the next day.  Denies any further swelling  RNCM Clinical Goal(s):  Patient will verbalize basic understanding of  Pain and Falls disease process and self health management plan as evidenced by No hospitalizations on the next 90 days demonstrate Ongoing health management independence as evidenced by participation at ALF, pain management and no falls  through collaboration with RN Care manager, provider, and care team.   Interventions: 1:1 collaboration with primary care provider regarding development and update of comprehensive plan of care as evidenced by provider attestation and co-signature Inter-disciplinary care team collaboration (see longitudinal plan of care) Evaluation of current treatment plan related to  self  management and patient's adherence to plan as established by provider   Falls Interventions:  (Status:  Goal on track:  Yes.) Long Term Goal Provided written and verbal education re: potential causes of falls and Fall prevention strategies Reviewed medications and discussed potential side effects of medications such as dizziness and frequent urination Advised patient of  importance of notifying provider of falls Assessed for falls since last encounter Assessed patients knowledge of fall risk prevention secondary to previously provided education Advised patient to discuss continued swelling in lower extremities with provider   Pain Interventions:  (Status:  Goal on track:  Yes.) Long Term Goal Pain assessment performed Medications reviewed Reviewed provider established plan for pain management Discussed importance of adherence to all scheduled medical appointments Counseled on the importance of reporting any/all new or changed pain symptoms or management strategies to pain management provider Advised patient to report to care team affect of pain on daily activities Discussed use of relaxation techniques and/or diversional activities to assist with pain reduction (distraction, imagery, relaxation, massage, acupressure, TENS, heat, and cold application Reviewed with patient prescribed pharmacological and nonpharmacological pain relief strategies  Patient Goals/Self-Care Activities: Take all medications as prescribed Attend all scheduled provider appointments Call provider office for new concerns or questions  Continue to monitor lower extremities for swelling Contact provider for swelling not resolved over night  Follow Up Plan:  The care management team will reach out to the patient again over the next 45 days.      Plan:The care management team will reach out to the patient again over the next 45 days.  Rhae Lerner RN, MSN RN Care Management Coordinator [facility] [phone] [email or web address]

## 2021-08-31 ENCOUNTER — Other Ambulatory Visit: Payer: Self-pay | Admitting: Pain Medicine

## 2021-08-31 DIAGNOSIS — E559 Vitamin D deficiency, unspecified: Secondary | ICD-10-CM

## 2021-08-31 DIAGNOSIS — M8580 Other specified disorders of bone density and structure, unspecified site: Secondary | ICD-10-CM

## 2021-09-12 ENCOUNTER — Other Ambulatory Visit: Payer: Self-pay | Admitting: Adult Health

## 2021-09-12 ENCOUNTER — Telehealth: Payer: Self-pay | Admitting: Adult Health

## 2021-09-12 ENCOUNTER — Other Ambulatory Visit: Payer: Self-pay | Admitting: Physician Assistant

## 2021-09-12 DIAGNOSIS — F419 Anxiety disorder, unspecified: Secondary | ICD-10-CM

## 2021-09-12 DIAGNOSIS — E785 Hyperlipidemia, unspecified: Secondary | ICD-10-CM

## 2021-09-12 MED ORDER — CLONAZEPAM 0.5 MG PO TABS: ORAL_TABLET | ORAL | 0 refills | Status: DC

## 2021-09-12 NOTE — Telephone Encounter (Signed)
Needing a refill on clonazePAM (KLONOPIN) 0.5 MG tablet.

## 2021-09-12 NOTE — Telephone Encounter (Signed)
SENT REFILL X 1

## 2021-09-12 NOTE — Telephone Encounter (Signed)
Scheduled

## 2021-09-12 NOTE — Telephone Encounter (Signed)
Please schedule 6 month F/U appointment. Thank you! 

## 2021-09-30 ENCOUNTER — Ambulatory Visit (INDEPENDENT_AMBULATORY_CARE_PROVIDER_SITE_OTHER): Payer: Medicare Other | Admitting: *Deleted

## 2021-09-30 DIAGNOSIS — I10 Essential (primary) hypertension: Secondary | ICD-10-CM

## 2021-09-30 DIAGNOSIS — R296 Repeated falls: Secondary | ICD-10-CM

## 2021-09-30 DIAGNOSIS — G894 Chronic pain syndrome: Secondary | ICD-10-CM

## 2021-09-30 DIAGNOSIS — G8929 Other chronic pain: Secondary | ICD-10-CM

## 2021-09-30 NOTE — Chronic Care Management (AMB) (Signed)
Chronic Care Management   CCM RN Visit Note  09/30/2021 Name: Carmen [name] MRN: [id number] DOB: 09-23-1941  Subjective: Carmen [name] is a 80 y.o. year old female who is a primary care patient of [name], [name], FNP. The care management team was consulted for assistance with disease management and care coordination needs.    Engaged with patient by telephone for follow up visit in response to provider referral for case management and/or care coordination services.   Consent to Services:  The patient was given information about Chronic Care Management services, agreed to services, and gave verbal consent prior to initiation of services.  Please see initial visit note for detailed documentation.   Patient agreed to services and verbal consent obtained.   Assessment: Review of patient past medical history, allergies, medications, health status, including review of consultants reports, laboratory and other test data, was performed as part of comprehensive evaluation and provision of chronic care management services.   SDOH (Social Determinants of Health) assessments and interventions performed:    CCM Care Plan  No Known Allergies  Outpatient Encounter Medications as of 09/30/2021  Medication Sig   amLODipine-benazepril (LOTREL) 5-10 MG capsule TAKE 1 CAPSULE BY MOUTH EVERY DAY   apixaban (ELIQUIS) 5 MG TABS tablet Take 1 tablet (5 mg total) by mouth 2 (two) times daily.   aspirin EC 81 MG tablet Take 81 mg by mouth daily. Swallow whole.   atorvastatin (LIPITOR) 20 MG tablet TAKE 1 TABLET BY MOUTH EVERYDAY AT BEDTIME   calcium carbonate (OSCAL) 1500 (600 Ca) MG TABS tablet Take 1 tablet (1,500 mg total) by mouth 2 (two) times daily with a meal.   cetirizine (ZYRTEC) 10 MG tablet Take 10 mg by mouth daily.   clonazePAM (KLONOPIN) 0.5 MG tablet TAKE 1 TABLET BY MOUTH EVERY DAY AS NEEDED FOR ANXIETY   gabapentin (NEURONTIN) 100 MG capsule Take 200 mg by mouth 2 (two) times daily.    HYDROcodone-acetaminophen (NORCO/VICODIN) 5-325 MG tablet Take 1 tablet by mouth 2 (two) times daily as needed for severe pain. Must last 30 days.   HYDROcodone-acetaminophen (NORCO/VICODIN) 5-325 MG tablet Take 1 tablet by mouth 2 (two) times daily as needed for severe pain. Must last 30 days.   HYDROcodone-acetaminophen (NORCO/VICODIN) 5-325 MG tablet Take 1 tablet by mouth 2 (two) times daily as needed for severe pain. Must last 30 days.   HYDROcodone-acetaminophen (NORCO/VICODIN) 5-325 MG tablet Take 1 tablet by mouth 2 (two) times daily as needed for severe pain. Must last 30 days.   levothyroxine (SYNTHROID) 100 MCG tablet Take 100 mcg by mouth daily before breakfast.   metoprolol succinate (TOPROL-XL) 100 MG 24 hr tablet Take 50 mg by mouth daily.   traZODone (DESYREL) 100 MG tablet Take 1 tablet (100 mg total) by mouth at bedtime as needed for sleep.   No facility-administered encounter medications on file as of 09/30/2021.    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/17/2021   Cervical facet syndrome (Bilateral) 05/17/2021   Cervical facet hypertrophy (Multilevel) (Bilateral) 05/17/2021   Spondylosis without myelopathy or radiculopathy, cervical region 05/17/2021   Spondylosis without myelopathy or radiculopathy, lumbosacral region 05/17/2021   Other intervertebral disc degeneration, lumbar region 05/17/2021   Abnormal MRI, cervical spine (05/12/2021) 05/17/2021  Abnormal CT scan, lumbar spine (05/11/2021) 05/17/2021   Chronic cervical radiculopathy (Bilateral) 05/02/2021   DDD (degenerative disc disease), cervical 05/02/2021   Foraminal stenosis of cervical region 05/02/2021    Vitamin D deficiency 04/30/2021   Osteopenia determined by x-ray 04/30/2021   Lumbar compression fracture (Old) (Multilevel), sequela 04/30/2021   Chronic pain syndrome 03/14/2021   Pharmacologic therapy 03/14/2021   Disorder of skeletal system 03/14/2021   Problems influencing health status 03/14/2021   Cervicalgia 03/14/2021   Chronic neck pain (2ry area of Pain) (Bilateral) (L>R) 03/14/2021   Chronic lower extremity pain (3ry area of Pain) (Bilateral) (L>R) 03/14/2021   Chronic thigh pain (Bilateral) 03/14/2021   Chronic hand pain (Left) 03/14/2021   Chronic hand pain (Right) 03/14/2021   Chronic low back pain (1ry area of Pain) (Bilateral) (R>L) w/o sciatica 02/17/2021   Difficulty sleeping 02/17/2021   Dry skin dermatitis 02/03/2021   At risk for falls 02/03/2021   Athlete's foot on right 02/03/2021   Essential hypertension 11/26/2020   Falls frequently 11/18/2020   Paroxysmal atrial fibrillation ([place]) 11/18/2020   Repeated falls 11/18/2020   Acute encephalopathy 10/06/2020   CVA (cerebral vascular accident) ([place]) 10/06/2020   Atrial flutter ([place]) 10/06/2020   Peripheral vascular disease ([place]) 10/06/2020   Depression, unspecified 10/02/2020   Acquired hypothyroidism 10/02/2020   Athscl heart disease of native coronary artery w/o ang pctrs 10/02/2020   Vitamin B deficiency, unspecified 10/02/2020   Other chronic pain 10/02/2020   Hyperlipidemia, unspecified 10/02/2020   Personal history of nicotine dependence 10/02/2020   Prsnl hx of TIA (TIA), and cereb infrc w/o resid deficits 10/02/2020   Anemia, unspecified 10/02/2020   Dorsalgia, unspecified 10/02/2020   History of stroke 10/02/2020   Chronic anticoagulation (Eliquis) 10/02/2020   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  Denies actual injuries with falls but lots of bruising.  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  Patient in retirement community ([place]).  Per daughter, patient continues to adjust well and becoming more comfortable at facility.  Has been more involved with facility activities. Patient has remained COVID free per daughter.  Using Rolator walker to ambulate; continues to be very active with her ambulation.  Daughter reports one fall in the last 2 months.  Daughter reports increase in swelling in ankles and feet left greater than right.  Swelling sometimes resolves with elevation or overnight.  Denies any increase in shortness of breath. Decreased adherence to prescribed treatment for fall prevention Clinical Goal(s):  patient will demonstrate improved adherence to prescribed treatment plan for decreasing falls as evidenced by patient reporting and review of EMR patient will verbalize using fall risk reduction strategies discussed patient will not experience additional falls patient will verbalize understanding of plan for fall reduction Interventions:  Collaboration with [name], Kelby Aline,  FNP regarding development and update of comprehensive plan of care as evidenced by provider attestation and co-signature Inter-disciplinary care team collaboration (see longitudinal plan of care) Provided written and verbal education re: Potential causes of falls and Fall prevention strategies Reviewed medications and discussed  potential side effects of medications such as dizziness and frequent urination Assessed for falls since last encounter. Assessed patients daughter's knowledge of fall risk prevention secondary to previously provided education. Assessed working status of patient call light system and patient adherence Encouraged continued participation in home health physical therapy Evaluation of current treatment plan related to falls and patient's adherence to plan as established by provider. Activities of daily living skills assessed Assistive or adaptive device use encouraged; encouraged to use Rolator walker at all times Barriers to physical activity or exercise identified and addressed; Barriers to safety identified Cognitive-stimulating activities promoted; encouraged to participate in group activities at facility as able Fall prevention plan reviewed and updated Fear of falling, loss of independence and pain acknowledged Medication list reviewed and compliance encouraged Discussed monitoring lower extremities for swelling and if swelling not reduced overnight or with elevation to contact primary care provider for appointment Patient Goals/Self-Care Deficits:  Always wear low-heeled or flat shoes or slippers with nonskid soles Keep my cell phone and call light with me always Learn how to get back up if I fall Make an emergency alert plan in case I fall Pick up clutter from the floors and use a nonslip pad with throw rugs, or remove them completely Use walker  with all ambulation Use a nightlight in the bathroom Continue to work with physical therapy  Monitor fluid in ankles and feet, contact provider for continued swelling Follow Up Plan: The care management team will reach out to the patient again over the next 30 business days.      Care Plan : RNCM  General Plan of Care (Adult)  Updates made by Leona Singleton, RN since 09/30/2021 12:00 AM     Problem: Knowledge deficit related to swlf  care maagement  of chronic medical comditions   Priority: Medium     Long-Range Goal: Patient's daughter will work with CCM team to gather knowledge to better manage mothers chronic mecical conditions   Start Date: 08/24/2021  Expected End Date: 08/24/2022  Priority: Medium  Note:   Current Barriers:  Knowledge Deficits related to plan of care for management of Pain and Falls  Care Coordination needs related to Level of care concerns    Knowledge Deficits related to fall precautions in patient with greater than 20 falls in the last year per daughter.  Denies actual injuries with falls but lots of bruising.  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  Patient in retirement community ([place]).  Per daughter, patient continues to adjust well and becoming more comfortable at facility.  Has been more involved with facility activities. Patient has remained COVID free per daughter.  Using Rolator walker to ambulate; continues to be very active with her ambulation.  Daughter reports one fall in the last 2 months.  Daughter reports increase in swelling in ankles and feet left greater than right.  Swelling sometimes resolves with elevation or overnight.  Denies any increase in shortness of breath.  Plans for cataract surgery next month RNCM Clinical Goal(s):  Patient will verbalize basic understanding of  Pain and Falls disease process and self health management plan as evidenced by No hospitalizations on the next 90  days demonstrate Ongoing health management independence as evidenced by participation at ALF, pain management and no falls  through collaboration with RN Care manager, provider, and care team.   Interventions: 1:1 collaboration with primary care provider regarding development and update of comprehensive plan of care as evidenced by provider attestation and co-signature Inter-disciplinary care team collaboration (see longitudinal plan  of care) Evaluation of current treatment plan related to  self management and patient's adherence to plan as established by provider   Falls Interventions:  (Status:  Goal on track:  Yes.) Long Term Goal Provided written and verbal education re: potential causes of falls and Fall prevention strategies Reviewed medications and discussed potential side effects of medications such as dizziness and frequent urination Advised patient of importance of notifying provider of falls Assessed for falls since last encounter Assessed patients knowledge of fall risk prevention secondary to previously provided education Advised patient to discuss continued swelling in lower extremities with provider   Pain Interventions:  (Status:  Goal on track:  Yes.) Long Term Goal Pain assessment performed Medications reviewed Reviewed provider established plan for pain management Discussed importance of adherence to all scheduled medical appointments Counseled on the importance of reporting any/all new or changed pain symptoms or management strategies to pain management provider Advised patient to report to care team affect of pain on daily activities Discussed use of relaxation techniques and/or diversional activities to assist with pain reduction (distraction, imagery, relaxation, massage, acupressure, TENS, heat, and cold application Reviewed with patient prescribed pharmacological and nonpharmacological pain relief strategies Discussed pain clinic and pain management and encouraged to attend appointments  Patient Goals/Self-Care Activities: Take all medications as prescribed Attend all scheduled provider appointments Call provider office for new concerns or questions  Continue to monitor lower extremities for swelling Contact provider for swelling not resolved over night  Follow Up Plan:  The care management team will reach out to the patient again over the next 45 days.      Plan:The care management  team will reach out to the patient again over the next 45 days.  Hubert Azure RN, MSN RN Care Management Coordinator [place] [phone] [email or web address]

## 2021-09-30 NOTE — Patient Instructions (Addendum)
Visit Information  Thank you for taking time to visit with me today. Please don't hesitate to contact me if I can be of assistance to you before our next scheduled telephone appointment.  Following are the goals we discussed today:  Take all medications as prescribed Attend all scheduled provider appointments Call provider office for new concerns or questions  Continue to monitor lower extremities for swelling Contact provider for swelling not resolved over night  Our next appointment is by telephone on 2/10 at 1315  Please call the care guide team at [phone] if you need to cancel or reschedule your appointment.   If you are experiencing a Mental Health or Behavioral Health Crisis or need someone to talk to, please call the Suicide and Crisis Lifeline: 988 call the [place] National Suicide Prevention Lifeline: 6517438103 or TTY: 262-862-0358 TTY 339-844-9391) to talk to a trained counselor call 1-800-273-TALK (toll free, 24 hour hotline) call 911   Patient verbalizes understanding of instructions provided today and agrees to view in MyChart.   Rhae Lerner RN, MSN RN Care Management Coordinator [facility] [phone] [email or web address]

## 2021-10-04 DIAGNOSIS — H25013 Cortical age-related cataract, bilateral: Secondary | ICD-10-CM | POA: Diagnosis not present

## 2021-10-04 DIAGNOSIS — H2513 Age-related nuclear cataract, bilateral: Secondary | ICD-10-CM | POA: Diagnosis not present

## 2021-10-04 DIAGNOSIS — H18413 Arcus senilis, bilateral: Secondary | ICD-10-CM | POA: Diagnosis not present

## 2021-10-04 DIAGNOSIS — H25043 Posterior subcapsular polar age-related cataract, bilateral: Secondary | ICD-10-CM | POA: Diagnosis not present

## 2021-10-07 ENCOUNTER — Telehealth: Payer: Self-pay | Admitting: Internal Medicine

## 2021-10-07 NOTE — Telephone Encounter (Signed)
° °  Patient Name: Carmen [name]  DOB: 04-21-1941 MRN: [id number]  Primary Cardiologist: Yvonne Kendall, MD  Chart reviewed as part of pre-operative protocol coverage. Cataract extractions are recognized in guidelines as low risk surgeries that do not typically require specific preoperative testing or holding of blood thinner therapy. Therefore, given past medical history and time since last visit, based on ACC/AHA guidelines, Yulissa Bero would be at acceptable risk for the planned procedure without further cardiovascular testing.   I will route this recommendation to the requesting party via Epic fax function and remove from pre-op pool.  Please call with questions.  Ronney Asters, NP 10/07/2021, 12:02 PM

## 2021-10-07 NOTE — Telephone Encounter (Signed)
Pre-operative Risk Assessment    Patient Name: Carmen [name]  DOB: [birth year] MRN: [id number] HEARTCARE STAFF-IMPORTANT INSTRUCTIONS 1 Red and Blue Text will auto delete once note is signed or closed. 2 Press F2 to navigate through template.   3 On drop down lists, L click to select >> R click to activate next field 4 Reason for Visit format is IMPORTANT!!  See Directions on No. 2 below. 5 Please review chart to determine if there is already a clearance note open for this procedure!!  DO NOT duplicate if a note already exists!!         Request for Surgical Clearance{ 1. What type of surgery is being performed? Enter name of procedure below and number of teeth if dental extraction.    Procedure:  Cataract extraction left and right eye  2. When is this surgery scheduled? Press F2 to enter date below and place date in Reason for Visit (see directions below).  Date of Surgery:  Clearance 12/12/21                              For convenience, highlight and copy (CTL+C) the Clearance MM/DD/YY phrase above. Click here to go to Reason for Visit.  Paste (CTL+V) the date.  Engineer, drilling.  Then click button underneath called Add Clearance MM/DD/YY as free text.        3. What is the name of the Surgeon, the Surgeon's Group or Practice, phone and fax number?  Press F2 and list below  Surgeon:  Dr. Mia Creek  Surgeon's Group or Practice Name:  [facility] [employer] Phone number:  [phone] Fax number:  [phone] 4. What type of clearance is requested?  Medical or Cardiac Clearance only?  Pharmacy Clearance Only (Request is to hold medication only)?  Or Both?  Press F2 and select the clearance requested.  If both are needed, select both from the drop down list.      Type of Clearance Requested:   - Medical   5. What type of anesthesia will be used?  Press F2 and select the anesthesia to be used for the procedure.   Type of Anesthesia:  Not Indicated  6. Are there any other requests or questions from the surgeon?     Additional requests/questions:  not listed  Signed, Dalia Heading   10/07/2021, 9:24 AM

## 2021-10-13 ENCOUNTER — Other Ambulatory Visit: Payer: Self-pay | Admitting: Adult Health

## 2021-10-13 ENCOUNTER — Telehealth: Payer: Self-pay | Admitting: Adult Health

## 2021-10-13 DIAGNOSIS — F419 Anxiety disorder, unspecified: Secondary | ICD-10-CM

## 2021-10-13 MED ORDER — CLONAZEPAM 0.5 MG PO TABS: ORAL_TABLET | ORAL | 0 refills | Status: DC

## 2021-10-13 NOTE — Telephone Encounter (Signed)
Pt daughter called stating pt need refill on clonazepam sent to [facility]

## 2021-10-13 NOTE — Telephone Encounter (Signed)
Sent refill x 1 last refill please schedule office visit and be sure controlled substance agreement is on file. I have to see her every 3 months to continue to fill.

## 2021-10-18 ENCOUNTER — Ambulatory Visit (INDEPENDENT_AMBULATORY_CARE_PROVIDER_SITE_OTHER): Payer: Medicare Other | Admitting: Adult Health

## 2021-10-18 ENCOUNTER — Encounter: Payer: Self-pay | Admitting: Adult Health

## 2021-10-18 ENCOUNTER — Other Ambulatory Visit: Payer: Self-pay

## 2021-10-18 VITALS — BP 126/88 | HR 88 | Ht 60.98 in | Wt 187.6 lb

## 2021-10-18 DIAGNOSIS — F419 Anxiety disorder, unspecified: Secondary | ICD-10-CM | POA: Diagnosis not present

## 2021-10-18 DIAGNOSIS — R600 Localized edema: Secondary | ICD-10-CM | POA: Insufficient documentation

## 2021-10-18 LAB — CBC WITH DIFFERENTIAL/PLATELET
Basophils Absolute: 0 10*3/uL (ref 0.0–0.1)
Basophils Relative: 0.5 % (ref 0.0–3.0)
Eosinophils Absolute: 0.1 10*3/uL (ref 0.0–0.7)
Eosinophils Relative: 2.4 % (ref 0.0–5.0)
HCT: 37.7 % (ref 36.0–46.0)
Hemoglobin: 12.2 g/dL (ref 12.0–15.0)
Lymphocytes Relative: 33.4 % (ref 12.0–46.0)
Lymphs Abs: 2 10*3/uL (ref 0.7–4.0)
MCHC: 32.3 g/dL (ref 30.0–36.0)
MCV: 92 fl (ref 78.0–100.0)
Monocytes Absolute: 0.7 10*3/uL (ref 0.1–1.0)
Monocytes Relative: 11.8 % (ref 3.0–12.0)
Neutro Abs: 3.1 10*3/uL (ref 1.4–7.7)
Neutrophils Relative %: 51.9 % (ref 43.0–77.0)
Platelets: 225 10*3/uL (ref 150.0–400.0)
RBC: 4.1 Mil/uL (ref 3.87–5.11)
RDW: 14.4 % (ref 11.5–15.5)
WBC: 6 10*3/uL (ref 4.0–10.5)

## 2021-10-18 LAB — COMPREHENSIVE METABOLIC PANEL
ALT: 14 U/L (ref 0–35)
AST: 15 U/L (ref 0–37)
Albumin: 4.2 g/dL (ref 3.5–5.2)
Alkaline Phosphatase: 41 U/L (ref 39–117)
BUN: 16 mg/dL (ref 6–23)
CO2: 26 mEq/L (ref 19–32)
Calcium: 9.3 mg/dL (ref 8.4–10.5)
Chloride: 103 mEq/L (ref 96–112)
Creatinine, Ser: 0.82 mg/dL (ref 0.40–1.20)
GFR: 67.85 mL/min (ref >60.00)
Glucose, Bld: 64 mg/dL — ABNORMAL LOW (ref 70–99)
Potassium: 4 mEq/L (ref 3.5–5.1)
Sodium: 138 mEq/L (ref 135–145)
Total Bilirubin: 0.4 mg/dL (ref 0.2–1.2)
Total Protein: 6.8 g/dL (ref 6.0–8.3)

## 2021-10-18 LAB — TSH: TSH: 2.62 u[IU]/mL (ref 0.35–5.50)

## 2021-10-18 LAB — MAGNESIUM: Magnesium: 1.6 mg/dL (ref 1.5–2.5)

## 2021-10-18 LAB — BRAIN NATRIURETIC PEPTIDE: Pro B Natriuretic peptide (BNP): 300 pg/mL — ABNORMAL HIGH (ref 0.0–100.0)

## 2021-10-18 MED ORDER — HYDROCHLOROTHIAZIDE 12.5 MG PO CAPS: 12.5000 mg | ORAL_CAPSULE | Freq: Every day | ORAL | 1 refills | Status: DC

## 2021-10-18 NOTE — Progress Notes (Signed)
Acute Office Visit  Subjective:    Patient ID: Carmen [name], female    DOB: 05-03-1941, 81 y.o.   MRN: [id number]  Chief Complaint  Patient presents with   Leg Swelling  Bilateral.   HPI Patient is in today for for leg swelling in bilateral lower edema feet and ankles and she has had this for 4 months. She is taking over the counter diuretic as well daughter and patient are unsure of the name of the medication that they are taking over-the-counter.  She has history of 4 years ago a left ankle fracture and reports it has been swelling over 4 years. Denies any pain in feet.  Right foot and ankle are swelling as well.  She is on amlodipine benazepril, reported to patient this can cause swelling and edema of the lower extremities.  She does not endorse any shortness of breath.  She does climb stairs occasionally with her daughter and has been walking without any difficulties per daughter.  She denies any other edema.  Has follow up with Eula Listen cardiology on 10/27/20. She has a follow-up appointment with him for presurgical clearance for cataract surgery.  She does question whether or not we can increase her Klonopin 0.5 mg that she takes once a day as needed for anxiety, she is now living in a assisted living and feels that the resident stresses her out more.  Declined this to patient has told her it would increase her risk for falls and that my preference would be to lower the dose of 0.5 mg to 0.25 mg but patient however declines this.  She has been taking this for years. Did offer patient an antidepressant however she declines this at this time.  She does have a history of hypothyroidism we will recheck her TSH today as well. She has no other concerns at this time today. Patient  denies any fever, body aches,chills, rash, chest pain, shortness of breath, nausea, vomiting, or diarrhea.  Denies dizziness, lightheadedness, pre syncopal or syncopal episodes.    Past Medical History:   Diagnosis Date   Allergy    Anemia    Anxiety    Arthritis    Back pain    Coronary artery disease    Depression    Hypertension    Peripheral vascular disease (HCC)    Stroke (HCC)    Thyroid disease     Past Surgical History:  Procedure Laterality Date   back injections     CARDIAC CATHETERIZATION     CORONARY ANGIOPLASTY     CORONARY ARTERY BYPASS GRAFT      Family History  Problem Relation Age of Onset   Breast cancer Mother    Bipolar disorder Daughter    Heart attack Father    Diabetes Mellitus II Neg Hx     Social History   Socioeconomic History   Marital status: Widowed    Spouse name: Not on file   Number of children: Not on file   Years of education: Not on file   Highest education level: Not on file  Occupational History   Not on file  Tobacco Use   Smoking status: Former    Types: Cigarettes    Quit date: 05/2020    Years since quitting: 1.4   Smokeless tobacco: Never   Tobacco comments:    Smoked about 1 pack per month  Vaping Use   Vaping Use: Never used  Substance and Sexual Activity   Alcohol use: Not Currently  Drug use: Never   Sexual activity: Not on file  Other Topics Concern   Not on file  Social History Narrative   ** Merged History Encounter **       Social Determinants of Health   Financial Resource Strain: Low Risk    Difficulty of Paying Living Expenses: Not hard at all  Food Insecurity: No Food Insecurity   Worried About Programme researcher, broadcasting/film/videounning Out of Food in the Last Year: Never true   Baristaan Out of Food in the Last Year: Never true  Transportation Needs: No Transportation Needs   Lack of Transportation (Medical): No   Lack of Transportation (Non-Medical): No  Physical Activity: Not on file  Stress: Not on file  Social Connections: Not on file  Intimate Partner Violence: Not on file    Outpatient Medications Prior to Visit  Medication Sig Dispense Refill   amLODipine-benazepril (LOTREL) 5-10 MG capsule TAKE 1 CAPSULE BY MOUTH  EVERY DAY 90 capsule 0   apixaban (ELIQUIS) 5 MG TABS tablet Take 1 tablet (5 mg total) by mouth 2 (two) times daily. 180 tablet 1   aspirin EC 81 MG tablet Take 81 mg by mouth daily. Swallow whole.     atorvastatin (LIPITOR) 20 MG tablet TAKE 1 TABLET BY MOUTH EVERYDAY AT BEDTIME 90 tablet 0   cetirizine (ZYRTEC) 10 MG tablet Take 10 mg by mouth daily.     clonazePAM (KLONOPIN) 0.5 MG tablet TAKE 1 TABLET BY MOUTH EVERY DAY AS NEEDED FOR ANXIETY 30 tablet 0   levothyroxine (SYNTHROID) 100 MCG tablet Take 100 mcg by mouth daily before breakfast.     metoprolol succinate (TOPROL-XL) 100 MG 24 hr tablet Take 50 mg by mouth daily.     traZODone (DESYREL) 100 MG tablet Take 1 tablet (100 mg total) by mouth at bedtime as needed for sleep. 90 tablet 1   HYDROcodone-acetaminophen (NORCO/VICODIN) 5-325 MG tablet Take 1 tablet by mouth 2 (two) times daily as needed for severe pain. Must last 30 days. 60 tablet 0   calcium carbonate (OSCAL) 1500 (600 Ca) MG TABS tablet Take 1 tablet (1,500 mg total) by mouth 2 (two) times daily with a meal. 60 tablet 2   gabapentin (NEURONTIN) 100 MG capsule Take 200 mg by mouth 2 (two) times daily. (Patient not taking: Reported on 10/18/2021)     HYDROcodone-acetaminophen (NORCO/VICODIN) 5-325 MG tablet Take 1 tablet by mouth 2 (two) times daily as needed for severe pain. Must last 30 days. 60 tablet 0   HYDROcodone-acetaminophen (NORCO/VICODIN) 5-325 MG tablet Take 1 tablet by mouth 2 (two) times daily as needed for severe pain. Must last 30 days. 60 tablet 0   HYDROcodone-acetaminophen (NORCO/VICODIN) 5-325 MG tablet Take 1 tablet by mouth 2 (two) times daily as needed for severe pain. Must last 30 days. 60 tablet 0   No facility-administered medications prior to visit.    No Known Allergies  Review of Systems  Constitutional: Negative.   HENT: Negative.    Respiratory: Negative.    Cardiovascular:  Positive for leg swelling. Negative for chest pain and  palpitations.  Gastrointestinal: Negative.   Genitourinary: Negative.   Musculoskeletal: Negative.   Skin: Negative.   Neurological: Negative.   Psychiatric/Behavioral:  Negative for agitation, behavioral problems, confusion, decreased concentration, dysphoric mood, hallucinations, self-injury, sleep disturbance and suicidal ideas. The patient is nervous/anxious. The patient is not hyperactive.       Objective:    Physical Exam Constitutional:      Appearance: She is  well-developed. She is obese. She is not ill-appearing or diaphoretic.  HENT:     Head: Normocephalic and atraumatic.     Right Ear: External ear normal.     Left Ear: External ear normal.     Nose: Nose normal.     Mouth/Throat:     Mouth: Mucous membranes are moist.  Eyes:     General: No scleral icterus.    Pupils: Pupils are equal, round, and reactive to light.  Neck:     Thyroid: No thyromegaly.     Vascular: No carotid bruit or JVD.  Cardiovascular:     Rate and Rhythm: Normal rate and regular rhythm.     Heart sounds: Normal heart sounds.  Pulmonary:     Effort: Pulmonary effort is normal.     Breath sounds: Normal breath sounds.  Abdominal:     General: Bowel sounds are normal.     Palpations: Abdomen is soft. There is no mass.     Tenderness: There is no abdominal tenderness.  Genitourinary:    Vagina: Normal.  Musculoskeletal:        General: Normal range of motion.     Cervical back: Normal range of motion and neck supple.     Right lower leg: Edema (1 +) present.     Left lower leg: Edema (2 plus) present.  Lymphadenopathy:     Cervical: No cervical adenopathy.  Skin:    General: Skin is warm and dry.  Neurological:     Mental Status: She is alert and oriented to person, place, and time.  Psychiatric:        Mood and Affect: Mood normal.        Behavior: Behavior normal.        Thought Content: Thought content normal.        Judgment: Judgment normal.    BP 126/88    Pulse 88    Ht 5'  0.98" (1.549 m)    Wt 187 lb 9.6 oz (85.1 kg)    SpO2 95%    BMI 35.47 kg/m  Wt Readings from Last 3 Encounters:  10/18/21 187 lb 9.6 oz (85.1 kg)  07/26/21 185 lb 12.8 oz (84.3 kg)  06/28/21 180 lb (81.6 kg)    Health Maintenance Due  Topic Date Due   COVID-19 Vaccine (1) Never done   INFLUENZA VACCINE  Never done    There are no preventive care reminders to display for this patient.   Lab Results  Component Value Date   TSH 2.51 07/26/2021   Lab Results  Component Value Date   WBC 6.1 07/26/2021   HGB 12.4 07/26/2021   HCT 37.7 07/26/2021   MCV 89.9 07/26/2021   PLT 245.0 07/26/2021   Lab Results  Component Value Date   NA 138 07/26/2021   K 3.9 07/26/2021   CO2 24 07/26/2021   GLUCOSE 115 (H) 07/26/2021   BUN 14 07/26/2021   CREATININE 0.83 07/26/2021   BILITOT 0.4 07/26/2021   ALKPHOS 41 07/26/2021   AST 20 07/26/2021   ALT 25 07/26/2021   PROT 6.5 07/26/2021   ALBUMIN 4.2 07/26/2021   CALCIUM 9.2 07/26/2021   ANIONGAP 9 03/14/2021   GFR 66.98 07/26/2021   Lab Results  Component Value Date   CHOL 182 07/26/2021   Lab Results  Component Value Date   HDL 50.10 07/26/2021   No results found for: [facility] Lab Results  Component Value Date   TRIG 292.0 (H) 07/26/2021  Lab Results  Component Value Date   CHOLHDL 4 07/26/2021   Lab Results  Component Value Date   HGBA1C 5.8 (H) 10/08/2020       Assessment & Plan:   Problem List Items Addressed This Visit       Other   Anxiety    10/18/2021 patient request increase in Klonopin dosage from 0.5 mg once daily as needed to 1 mg.  Provider declined request due to age and increased risk for falls.  I do recommend that she may be started on antidepressant however she declines this at this time.  Patient verbalized understanding.      Pedal edema - Primary    10/18/2021 patient notes has been going on for about 4 months she does not endorse any shortness of breath.  We will get chest x-ray today and  labs.  She does have a follow-up with Alycia Rossetti done next week and she will have a follow-up with me in 1 month and sooner if any symptoms worsen.  Suspect this is a side effect from amlodipine benazepril that patient has been taking.  Left foot is worse than the right foot however she does have a history of a fracture of the left foot over 4 years ago that is patient reported and reports she has had swelling in that foot ever since without any pain or new changes.  Denies any erythema or drainage.      Relevant Orders   CBC with Differential/Platelet   Comprehensive metabolic panel   B Nat Peptide   TSH   Magnesium   DG Chest 2 View   Comprehensive metabolic panel  Can refer to psychiatry declined.  She can try melatonin at night to see if it helps her relax 5 mg sublingual ok can lower if too much.    Meds ordered this encounter  Medications   hydrochlorothiazide (MICROZIDE) 12.5 MG capsule    Sig: Take 1 capsule (12.5 mg total) by mouth daily.    Dispense:  90 capsule    Refill:  1   Continue other antihypertensive.  Will need CMP in 3 months after adding diuretic. Schedule your lab appointment when you leave the office or call the office for future labs that have been ordered.   Keep follow up with Eula Listen next week at a cardiology.   Labs and x ray today.  Will start low dose dieretic, monitor blood pressure and for any dizziness, lightheadedness, stop over the counter diuretic.  Compression stockings. Elevated legs when able.   Follow treatment plan from office  if not improving or any worsening within 72 hours and also return to office or open medical facility at ANYTIME if any symptoms persist, change, or worsen or you have any further concerns or questions. Call 911 immediately for emergencies.   Return in about 3 months (around 01/16/2022), or if symptoms worsen or fail to improve, for at any time for any worsening symptoms, Go to Emergency room/ urgent care if worse.     Jairo Ben, FNP

## 2021-10-18 NOTE — Patient Instructions (Signed)
Edema Edema is when you have too much fluid in your body or under your skin. Edema may make your legs, feet, and ankles swell. Swelling often happens in looser tissues, such as around your eyes. This is a common condition. It gets more common as you get older. There are many possible causes of edema. These include: Eating too much salt (sodium). Being on your feet or sitting for a long time. Certain medical conditions, such as: Pregnancy. Heart failure. Liver disease. Kidney disease. Cancer. Hot weather may make edema worse. Edema is usually painless. Your skin may look swollen or shiny. Follow these instructions at home: Medicines Take over-the-counter and prescription medicines only as told by your doctor. Your doctor may prescribe a medicine to help your body get rid of extra water (diuretic). Take this medicine if you are told to take it. Eating and drinking Eat a low-salt (low-sodium) diet as told by your doctor. Sometimes, eating less salt may reduce swelling. Depending on the cause of your swelling, you may need to limit how much fluid you drink (fluid restriction). General instructions Raise the injured area above the level of your heart while you are sitting or lying down. Do not sit still or stand for a long time. Do not wear tight clothes. Do not wear garters on your upper legs. Exercise your legs. This can help the swelling go down. Wear compression stockings as told by your doctor. It is important that these are the right size. These should be prescribed by your doctor to prevent possible injuries. If elastic bandages or wraps are recommended, use them as told by your doctor. Contact a doctor if: Treatment is not working. You have heart, liver, or kidney disease and have symptoms of edema. You have sudden and unexplained weight gain. Get help right away if: You have shortness of breath or chest pain. You cannot breathe when you lie down. You have pain, redness, or warmth  in the swollen areas. You have heart, liver, or kidney disease and get edema all of a sudden. You have a fever and your symptoms get worse all of a sudden. These symptoms may be an emergency. Get help right away. Call 911. Do not wait to see if the symptoms will go away. Do not drive yourself to the hospital. Summary Edema is when you have too much fluid in your body or under your skin. Edema may make your legs, feet, and ankles swell. Swelling often happens in looser tissues, such as around your eyes. Raise the injured area above the level of your heart while you are sitting or lying down. Follow your doctor's instructions about diet and how much fluid you can drink. This information is not intended to replace advice given to you by your health care provider. Make sure you discuss any questions you have with your health care provider. Document Revised: 05/23/2021 Document Reviewed: 05/23/2021 Elsevier Patient Education  2022 [employer] Hydrochlorothiazide Capsules or Tablets What is this medication? HYDROCHLOROTHIAZIDE (hye droe klor oh THYE a zide) treats high blood pressure. It may also be used to reduce swelling related to heart, kidney, or liver disease. It helps your kidneys remove more fluid and salt from your blood through the urine. It belongs to a group of medications called diuretics. This medicine may be used for other purposes; ask your health care provider or pharmacist if you have questions. COMMON BRAND NAME(S): Esidrix, Ezide, HydroDIURIL, Microzide, Oretic, Zide What should I tell my care team before I take this  medication? They need to know if you have any of these conditions: Diabetes Gout Kidney disease Liver disease Lupus Pancreatitis An unusual or allergic reaction to hydrochlorothiazide, sulfa drugs, other medications, foods, dyes, or preservatives Pregnant or trying to get pregnant Breast-feeding How should I use this medication? Take this medication by  mouth. Take it as directed on the prescription label at the same time every day. You can take it with or without food. If it upsets your stomach, take it with food. Keep taking it unless your care team tells you to stop. Talk to your care team about the use of this medication in children. While it may be prescribed for children as young as newborns for selected conditions, precautions do apply. Overdosage: If you think you have taken too much of this medicine contact a poison control center or emergency room at once. NOTE: This medicine is only for you. Do not share this medicine with others. What if I miss a dose? If you miss a dose, take it as soon as you can. If it is almost time for your next dose, take only that dose. Do not take double or extra doses. What may interact with this medication? Cholestyramine Colestipol Digoxin Dofetilide Lithium Medications for blood pressure Medications for diabetes Medications that relax muscles for surgery Other diuretics Steroid medications like prednisone or cortisone This list may not describe all possible interactions. Give your health care provider a list of all the medicines, herbs, non-prescription drugs, or dietary supplements you use. Also tell them if you smoke, drink alcohol, or use illegal drugs. Some items may interact with your medicine. What should I watch for while using this medication? Visit your health care provider for regular check-ups. Check your blood pressure as directed. Ask your health care provider what your blood pressure should be. Also, find out when you should contact him or her. Do not treat yourself for coughs, colds, or pain while you are using this medication without asking your health care provider for advice. Some medications may increase your blood pressure. You may get drowsy or dizzy. Do not drive, use machinery, or do anything that needs mental alertness until you know how this medication affects you. Do not stand  or sit up quickly, especially if you are an older patient. This reduces the risk of dizzy or fainting spells. Alcohol can make you more drowsy and dizzy. Avoid alcoholic drinks. Talk to your health care professional about your risk of skin cancer. You may be more at risk for skin cancer if you take this medication. This medication can make you more sensitive to the sun. Keep out of the sun. If you cannot avoid being in the sun, wear protective clothing and use sunscreen. Do not use sun lamps or tanning beds/booths. You may need to be on a special diet while taking this medication. Ask your health care provider. Also, find out how many glasses of fluids you need to drink each day. Check with your health care provider if you get an attack of severe diarrhea, nausea and vomiting, or if you sweat a lot. The loss of too much body fluid can make it dangerous for you to take this medication. This medication may increase blood sugar. Ask your healthcare provider if changes in diet or medications are needed if you have diabetes. What side effects may I notice from receiving this medication? Side effects that you should report to your care team as soon as possible: Allergic reactions--skin rash, itching, hives, swelling  of the face, lips, tongue, or throat Dehydration--increased thirst, dry mouth, feeling faint or lightheaded, headache, dark yellow or brown urine Gout--severe pain, redness, warmth, or swelling in joints, such as the big toe Kidney injury--decrease in the amount of urine, swelling of the ankles, hands, or feet Low blood pressure--dizziness, feeling faint or lightheaded, blurry vision Low potassium level--muscle pain or cramps, unusual weakness, fatigue, fast or irregular heartbeat, constipation Sudden eye pain or change in vision such as blurred vision, seeing halos around lights, vision loss Side effects that usually do not require medical attention (report to your care team if they continue or  are bothersome): Change in sex drive or performance Headache Upset stomach This list may not describe all possible side effects. Call your doctor for medical advice about side effects. You may report side effects to FDA at 1-800-FDA-1088. Where should I keep my medication? Keep out of the reach of children and pets. Store at room temperature between 20 and 25 degrees C (68 and 77 degrees F). Protect from light and moisture. Keep the container tightly closed. Do not freeze. Get rid of any unused medication after the expiration date. To get rid of medications that are no longer needed or have expired: Take the medication to a medication take-back program. Check with your pharmacy or law enforcement to find a location. If you cannot return the medication, check the label or package insert to see if the medication should be thrown out in the garbage or flushed down the toilet. If you are not sure, ask your care team. If it is safe to put in the trash, empty the medication out of the container. Mix the medication with cat litter, dirt, coffee grounds, or other unwanted substance. Seal the mixture in a bag or container. Put it in the trash. NOTE: This sheet is a summary. It may not cover all possible information. If you have questions about this medicine, talk to your doctor, pharmacist, or health care provider.  2022 Elsevier/Gold Standard (2021-06-07 00:00:00) Amlodipine; Benazepril Oral Capsules What is this medication? AMLODIPINE; BENAZEPRIL (am LOE di peen; ben AY ze pril)is a combination of a calcium channel blocker and an ACE inhibitor. It treats high blood pressure. This medicine may be used for other purposes; ask your health care provider or pharmacist if you have questions. COMMON BRAND NAME(S): Lotrel What should I tell my care team before I take this medication? They need to know if you have any of these conditions: bone marrow disease heart or blood vessel disease if you are on a  special diet, such as a low-salt diet immune system disease or disorder, like lupus kidney or liver disease previous swelling of the tongue, face, or lips with difficulty breathing, difficulty swallowing, hoarseness, or tightening of the throat an unusual or allergic reaction to amlodipine, benazepril, other medicines, insect venom, foods, dyes, or preservatives pregnant or trying to get pregnant breast-feeding How should I use this medication? Take this drug by mouth. Take it as directed on the prescription label at the same time every day. You can take it with or without food. If it upsets your stomach, take it with food. Keep taking it unless your health care provider tells you to stop. Talk to your health care provider about the use of this drug in children. Special care may be needed. Overdosage: If you think you have taken too much of this medicine contact a poison control center or emergency room at once. NOTE: This medicine is only for  you. Do not share this medicine with others. What if I miss a dose? If you miss a dose, take it as soon as you can. If it is almost time for your next dose, take only that dose. Do not take double or extra doses. What may interact with this medication? Do not take this medication with any of the following medications: sacubitril; valsartan This medicine may also interact with the following: diuretics, especially amiloride, triamterene, or spironolactone everolimus lithium medicines for blood pressure potassium salts or potassium supplements sirolimus temsirolimus This list may not describe all possible interactions. Give your health care provider a list of all the medicines, herbs, non-prescription drugs, or dietary supplements you use. Also tell them if you smoke, drink alcohol, or use illegal drugs. Some items may interact with your medicine. What should I watch for while using this medication? Visit your doctor or health care professional for  regular checks on your progress. Check your blood pressure as directed. Ask your doctor or health care professional what your blood pressure should be and when you should contact him or her. Call your doctor or health care professional if you notice an irregular or fast heart beat. You may get drowsy or dizzy. Do not drive, use machinery, or do anything that needs mental alertness until you know how this drug affects you. Do not stand or sit up quickly, especially if you are an older patient. This reduces the risk of dizzy or fainting spells. Alcohol can make you more drowsy and dizzy. Avoid alcoholic drinks. If you are going to have surgery, tell your doctor or health care professional that you are taking this medicine. Women should inform their doctor if they wish to become pregnant or think they might be pregnant. There is a potential for serious side effects to an unborn child. Talk to your health care professional or pharmacist for more information. Check with your doctor or health care professional if you get an attack of severe diarrhea, nausea and vomiting, or if you sweat a lot. The loss of body fluid can make it dangerous to take this medicine. A few patients have had strong allergic reactions during desensitization treatment with hymenoptera venom and during some kinds of dialysis. Talk to your doctor if you are going to have either of these procedures. Avoid salt substitutes unless you are told otherwise by your doctor or health care professional. Do not treat yourself for coughs, colds, or pain while you are taking this medicine without asking your doctor or health care professional for advice. Some ingredients may increase your blood pressure. What side effects may I notice from receiving this medication? Side effects that you should report to your doctor or health care professional as soon as possible: allergic reactions like skin rash, itching or hives, swelling of the face, lips, or  tongue breathing problems chest pain dry cough fast, irregular heartbeat redness, blistering, peeling or loosening of the skin, including inside the mouth stomach pain swelling of your ankles, legs trouble passing urine or change in the amount of urine Side effects that usually do not require medical attention (report to your doctor or health care professional if they continue or are bothersome): drowsiness or dizziness facial flushing headache This list may not describe all possible side effects. Call your doctor for medical advice about side effects. You may report side effects to FDA at 1-800-FDA-1088. Where should I keep my medication? Keep out of the reach of children and pets. Store at room temperature between  15 and 30 degrees C (59 and 86 degrees F). Protect from moisture. Keep the container tightly closed. Throw away any unused drug after the expiration date. NOTE: This sheet is a summary. It may not cover all possible information. If you have questions about this medicine, talk to your doctor, pharmacist, or health care provider.  2022 Elsevier/Gold Standard (2021-06-07 00:00:00)

## 2021-10-18 NOTE — Assessment & Plan Note (Signed)
10/18/2021 patient notes has been going on for about 4 months she does not endorse any shortness of breath.  We will get chest x-ray today and labs.  She does have a follow-up with Alycia Rossetti done next week and she will have a follow-up with me in 1 month and sooner if any symptoms worsen.  Suspect this is a side effect from amlodipine benazepril that patient has been taking.  Left foot is worse than the right foot however she does have a history of a fracture of the left foot over 4 years ago that is patient reported and reports she has had swelling in that foot ever since without any pain or new changes.  Denies any erythema or drainage.

## 2021-10-18 NOTE — Assessment & Plan Note (Signed)
10/18/2021 patient request increase in Klonopin dosage from 0.5 mg once daily as needed to 1 mg.  Provider declined request due to age and increased risk for falls.  I do recommend that she may be started on antidepressant however she declines this at this time.  Patient verbalized understanding.

## 2021-10-19 NOTE — Progress Notes (Signed)
CMP is ok, glucose slightly on low side be sure to eat steady meals throughout the day and monitor for hypoglycemia.  BNP is mildly elevated ok for age. HCTZ 12.5mg  was started on 10/18/20 has follow up with cardiology this next week,  CBC within normal limits.  TSH ok.  Magnesium ok.  Chest x ray still pending results.   [facility] FYI.

## 2021-10-23 NOTE — Progress Notes (Signed)
PROVIDER NOTE: Information contained herein reflects review and annotations entered in association with encounter. Interpretation of such information and data should be left to medically-trained personnel. Information provided to patient can be located elsewhere in the medical record under "Patient Instructions". Document created using STT-dictation technology, any transcriptional errors that may result from process are unintentional.    Patient: Carmen [name]  Service Category: E/M  Provider: Gaspar Cola, MD  DOB: 19-Jun-1941  DOS: 10/24/2021  Specialty: Interventional Pain Management  MRN: [id number]  Setting: Ambulatory outpatient  PCP: Doreen Beam, FNP  Type: Established Patient    Referring Provider: Sharmon Leyden*  Location: Office  Delivery: Face-to-face     HPI  Carmen [name], a 81 y.o. year old female, is here today because of her Chronic pain syndrome [G89.4]. Carmen [name] primary complain today is Neck Pain (left) and Back Pain (Lower- radiates down side of legs bilateral) Last encounter: My last encounter with her was on 08/31/2021. Pertinent problems: Carmen [name] has Peripheral vascular disease ([place]); Other chronic pain; Chronic low back pain (1ry area of Pain) (Bilateral) (R>L) w/o sciatica; Dorsalgia, unspecified; Repeated falls; Chronic pain syndrome; Cervicalgia; Chronic neck pain (2ry area of Pain) (Bilateral) (L>R); Chronic lower extremity pain (3ry area of Pain) (Bilateral) (L>R); Chronic thigh pain (Bilateral); Chronic hand pain (Left); Chronic hand pain (Right); Lumbar compression fracture (Old) (Multilevel), sequela; Chronic cervical radiculopathy (Bilateral); DDD (degenerative disc disease), cervical; Foraminal stenosis of cervical region; Lumbar facet joint syndrome (Bilateral); Lumbosacral facet hypertrophy (Multilevel) (Bilateral); Lumbar facet arthropathy (Multilevel) (Bilateral); 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 Pain Assessment: Severity of Chronic pain is reported as a 8 /10. Location: Neck Left/down shoulder and arm to all fingers. Onset: More than a month ago. Quality: Aching, Constant, Discomfort. Timing: Constant. Modifying factor(s): Biofreeze, rest. Vitals:  height is 5' 1"  (1.549 m) and weight is 187 lb (84.8 kg). Her temperature is 97.2 F (36.2 C) (abnormal). Her blood pressure is 130/74 and her pulse is 101 (abnormal). Her respiration is 16 and oxygen saturation is 97%.   Reason for encounter: medication management.   The patient indicates doing well with the current medication regimen. No adverse reactions or side effects reported to the medications.  Questions and information provided about "pain patches".  RTCB: 01/28/2022  Pharmacotherapy Assessment  Analgesic: Hydrocodone/APAP 5/325, 1 tab p.o. BID (10 mg/day of hydrocodone) (10 MME) (last filled on 05/02/2021) MME/day: 10 mg/day   Monitoring: [place] PMP: PDMP reviewed during this encounter.       Pharmacotherapy: No side-effects or adverse reactions reported. Compliance: No problems identified. Effectiveness: Clinically acceptable.  Ignatius Specking, RN  10/24/2021 10:58 AM  Sign when Signing Visit Nursing Pain Medication Assessment:  Safety precautions to be maintained throughout the outpatient stay will include: orient to surroundings, keep bed in low position, maintain call bell within reach at all times, provide assistance with transfer out of bed and ambulation.  Medication Inspection Compliance: Pill count  conducted under aseptic conditions, in front of the patient. Neither the pills nor the bottle was removed from the patient's sight at any time. Once count was completed pills were immediately returned to the patient in their original bottle.  Medication: Hydrocodone/APAP Pill/Patch Count:  8 of 60 pills remain Pill/Patch Appearance: Markings consistent with prescribed medication Bottle Appearance: Standard pharmacy container. Clearly labeled. Filled Date: 53 / 30 / 2023 Last Medication intake:  Today    UDS:  Summary  Date Value Ref Range Status  03/14/2021 Note  Final    Comment:    ==================================================================== Compliance Drug Analysis, Ur ==================================================================== Test                             Result       Flag       Units  Drug Present and Declared for Prescription Verification   Hydrocodone                    900          EXPECTED   ng/mg creat   Hydromorphone                  196          EXPECTED   ng/mg creat   Dihydrocodeine                 166          EXPECTED   ng/mg creat   Norhydrocodone                 1484         EXPECTED   ng/mg creat    Sources of hydrocodone include scheduled prescription medications.    Hydromorphone, dihydrocodeine and norhydrocodone are expected    metabolites of hydrocodone. Hydromorphone and dihydrocodeine are    also available as scheduled prescription medications.    Trazodone                      PRESENT      EXPECTED   1,3 chlorophenyl piperazine    PRESENT      EXPECTED    1,3-chlorophenyl piperazine is an expected metabolite of trazodone.    Acetaminophen                  PRESENT      EXPECTED   Metoprolol                     PRESENT      EXPECTED  Drug Present not Declared for Prescription Verification   Ephedrine/Pseudoephedrine      PRESENT      UNEXPECTED   Phenylpropanolamine            PRESENT      UNEXPECTED    Source of  ephedrine/pseudoephedrine is most commonly pseudoephedrine    in over-the-counter or prescription cold and allergy medications.    Phenylpropanolamine is an expected metabolite of    ephedrine/pseudoephedrine.    Guaifenesin                    PRESENT      UNEXPECTED    Guaifenesin may be administered as an over-the-counter or    prescription drug; it may also be present as a breakdown product of    methocarbamol.  Drug Absent but Declared for Prescription Verification   Clonazepam                     Not Detected UNEXPECTED ng/mg creat   Salicylate  Not Detected UNEXPECTED    Aspirin, as indicated in the declared medication list, is not always    detected even when used as directed.  ==================================================================== Test                      Result    Flag   Units      Ref Range   Creatinine              56               mg/dL      >=20 ==================================================================== Declared Medications:  The flagging and interpretation on this report are based on the  following declared medications.  Unexpected results may arise from  inaccuracies in the declared medications.   **Note: The testing scope of this panel includes these medications:   Clonazepam (Klonopin)  Hydrocodone (Norco)  Metoprolol (Toprol)  Trazodone (Desyrel)   **Note: The testing scope of this panel does not include small to  moderate amounts of these reported medications:   Acetaminophen (Norco)  Aspirin   **Note: The testing scope of this panel does not include the  following reported medications:   Amlodipine (Lotrel)  Apixaban (Eliquis)  Atorvastatin (Lipitor)  Benazepril (Lotrel)  Cyanocobalamin  Fluconazole (Diflucan)  Levothyroxine (Synthroid)  Nystatin  Triamcinolone ==================================================================== For clinical consultation, please call [phone]. ====================================================================      ROS  Constitutional: Denies any fever or chills Gastrointestinal: No reported hemesis, hematochezia, vomiting, or acute GI distress Musculoskeletal: Denies any acute onset joint swelling, redness, loss of ROM, or weakness Neurological: No reported episodes of acute onset apraxia, aphasia, dysarthria, agnosia, amnesia, paralysis, loss of coordination, or loss of consciousness  Medication Review  HYDROcodone-acetaminophen, amLODipine-benazepril, apixaban, aspirin EC, atorvastatin, calcium carbonate, cetirizine, clonazePAM, hydrochlorothiazide, levothyroxine, metoprolol succinate, and traZODone  History Review  Allergy: Carmen [name] has No Known Allergies. Drug: Carmen [name]  reports no history of drug use. Alcohol:  reports that she does not currently use alcohol. Tobacco:  reports that she quit smoking about 17 months ago. Her smoking use included cigarettes. She has never used smokeless tobacco. Social: Carmen [name]  reports that she quit smoking about 17 months ago. Her smoking use included cigarettes. She has never used smokeless tobacco. She reports that she does not currently use alcohol. She reports that she does not use drugs. Medical:  has a past medical history of Allergy, Anemia, Anxiety, Arthritis, Back pain, Coronary artery disease, Depression, Hypertension, Peripheral vascular disease ([place]), Stroke ([place]), and Thyroid disease. Surgical: Carmen [name]  has a past surgical history that includes Cardiac catheterization; Coronary angioplasty; Coronary artery bypass graft; and back injections. Family: family history includes Bipolar disorder in her daughter; Breast cancer in her mother; Heart attack in her father.  Laboratory Chemistry Profile   Renal Lab Results  Component Value Date   BUN 16 10/18/2021   CREATININE 0.82 10/18/2021   BCR 13 11/22/2020   GFR 67.85 10/18/2021   GFRAA 73 11/22/2020   GFRNONAA  >60 03/14/2021    Hepatic Lab Results  Component Value Date   AST 15 10/18/2021   ALT 14 10/18/2021   ALBUMIN 4.2 10/18/2021   ALKPHOS 41 10/18/2021   AMMONIA 19 10/06/2020    Electrolytes Lab Results  Component Value Date   NA 138 10/18/2021   K 4.0 10/18/2021   CL 103 10/18/2021   CALCIUM 9.3 10/18/2021   MG 1.6 10/18/2021    Bone Lab Results  Component Value  Date   25OHVITD1 12 (L) 03/14/2021   25OHVITD2 <1.0 03/14/2021   25OHVITD3 12 03/14/2021    Inflammation (CRP: Acute Phase) (ESR: Chronic Phase) Lab Results  Component Value Date   CRP 0.8 03/14/2021   ESRSEDRATE 4 03/14/2021         Note: Above Lab results reviewed.  Recent Imaging Review  DG PAIN CLINIC C-ARM 1-60 MIN NO REPORT Fluoro was used, but no Radiologist interpretation will be provided.  Please refer to "NOTES" tab for provider progress note. Note: Reviewed        Physical Exam  General appearance: Well nourished, well developed, and well hydrated. In no apparent acute distress Mental status: Alert, oriented x 3 (person, place, & time)       Respiratory: No evidence of acute respiratory distress Eyes: PERLA Vitals: BP 130/74    Pulse (!) 101    Temp (!) 97.2 F (36.2 C)    Resp 16    Ht 5' 1"  (1.549 m)    Wt 187 lb (84.8 kg)    SpO2 97%    BMI 35.33 kg/m  BMI: Estimated body mass index is 35.33 kg/m as calculated from the following:   Height as of this encounter: 5' 1"  (1.549 m).   Weight as of this encounter: 187 lb (84.8 kg). Ideal: Ideal body weight: 47.8 kg (105 lb 6.1 oz) Adjusted ideal body weight: 62.6 kg (138 lb 0.5 oz)  Assessment   Status Diagnosis  Controlled Worsening Worsening 1. Chronic pain syndrome   2. Chronic low back pain (1ry area of Pain) (Bilateral) (L>R) w/o sciatica   3. Lumbar facet joint syndrome (Bilateral)   4. Chronic neck pain (2ry area of Pain) (Bilateral) (L>R)   5. Chronic lower extremity pain (3ry area of Pain) (Bilateral) (L>R)   6. Pharmacologic  therapy   7. Chronic use of opiate for therapeutic purpose   8. Encounter for medication management      Updated Problems: No problems updated.  Plan of Care  Problem-specific:  No problem-specific Assessment & Plan notes found for this encounter.  Carmen [name] has a current medication list which includes the following long-term medication(s): amlodipine-benazepril, apixaban, atorvastatin, cetirizine, clonazepam, hydrochlorothiazide, [START ON 10/30/2021] hydrocodone-acetaminophen, [START ON 11/29/2021] hydrocodone-acetaminophen, [START ON 12/29/2021] hydrocodone-acetaminophen, metoprolol succinate, trazodone, and calcium carbonate.  Pharmacotherapy (Medications Ordered): Meds ordered this encounter  Medications   HYDROcodone-acetaminophen (NORCO/VICODIN) 5-325 MG tablet    Sig: Take 1 tablet by mouth 2 (two) times daily as needed for severe pain. Must last 30 days.    Dispense:  60 tablet    Refill:  0    DO NOT: delete (not duplicate); no partial-fill (will deny script to complete), no refill request (F/U required). DISPENSE: 1 day early if closed on fill date. WARN: No CNS-depressants within 8 hrs of med.   HYDROcodone-acetaminophen (NORCO/VICODIN) 5-325 MG tablet    Sig: Take 1 tablet by mouth 2 (two) times daily as needed for severe pain. Must last 30 days.    Dispense:  60 tablet    Refill:  0    DO NOT: delete (not duplicate); no partial-fill (will deny script to complete), no refill request (F/U required). DISPENSE: 1 day early if closed on fill date. WARN: No CNS-depressants within 8 hrs of med.   HYDROcodone-acetaminophen (NORCO/VICODIN) 5-325 MG tablet    Sig: Take 1 tablet by mouth 2 (two) times daily as needed for severe pain. Must last 30 days.    Dispense:  60  tablet    Refill:  0    DO NOT: delete (not duplicate); no partial-fill (will deny script to complete), no refill request (F/U required). DISPENSE: 1 day early if closed on fill date. WARN: No CNS-depressants  within 8 hrs of med.   Orders:  Orders Placed This Encounter  Procedures   LUMBAR FACET(MEDIAL BRANCH NERVE BLOCK) MBNB    Standing Status:   Future    Standing Expiration Date:   01/22/2022    Scheduling Instructions:     Procedure: Lumbar facet block (AKA.: Lumbosacral medial branch nerve block)     Side: Bilateral     Level: L3-4 & L5-S1 Facets (L2, L3, L4, L5, & S1 Medial Branch Nerves)     Sedation: Patient's choice.     Timeframe: ASAA    Order Specific Question:   Where will this procedure be performed?    Answer:   [facility]   Follow-up plan:   Return for (Clinic) procedure: (B) L-FCT Blk #2, (Sed-anx).     Interventional Therapies  Risk   Complexity Considerations:   Estimated body mass index is 34.2 kg/m as calculated from the following:   Height as of this encounter: 5' 1"  (1.549 m).   Weight as of this encounter: 181 lb (82.1 kg). ELIQUIS + ASA Anticoagulation (Stop: 3 days   Restart: 6 hours)   Planned   Pending:      Under consideration:   Pending results of CT and MRI  Diagnostic/therapeutic cervical ESI  Diagnostic bilateral lumbar facet MBB #2    Completed:   Diagnostic bilateral lumbar facet MBB x1 (05/31/2021) (100/100/50/50)    Therapeutic   Palliative (PRN) options:   None established    Recent Visits No visits were found meeting these conditions. Showing recent visits within past 90 days and meeting all other requirements Today's Visits Date Type Provider Dept  10/24/21 Office Visit Milinda Pointer, MD [facility]  Showing today's visits and meeting all other requirements Future Appointments Date Type Provider Dept  11/03/21 Appointment Milinda Pointer, [facility]  01/11/22 Appointment Milinda Pointer, MD [facility]  Showing future appointments within next 90 days and meeting all other requirements  I discussed the assessment and treatment plan with the patient. The patient was provided  an opportunity to ask questions and all were answered. The patient agreed with the plan and demonstrated an understanding of the instructions.  Patient advised to call back or seek an in-person evaluation if the symptoms or condition worsens.  Duration of encounter: 30 minutes.  Note by: Gaspar Cola, MD Date: 10/24/2021; Time: 12:46 PM

## 2021-10-24 ENCOUNTER — Other Ambulatory Visit: Payer: Self-pay

## 2021-10-24 ENCOUNTER — Encounter: Payer: Self-pay | Admitting: Pain Medicine

## 2021-10-24 ENCOUNTER — Ambulatory Visit: Payer: Medicare Other | Attending: Pain Medicine | Admitting: Pain Medicine

## 2021-10-24 VITALS — BP 130/74 | HR 101 | Temp 97.2°F | Resp 16 | Ht 61.0 in | Wt 187.0 lb

## 2021-10-24 DIAGNOSIS — Z79891 Long term (current) use of opiate analgesic: Secondary | ICD-10-CM | POA: Diagnosis not present

## 2021-10-24 DIAGNOSIS — M542 Cervicalgia: Secondary | ICD-10-CM | POA: Diagnosis not present

## 2021-10-24 DIAGNOSIS — G894 Chronic pain syndrome: Secondary | ICD-10-CM | POA: Diagnosis not present

## 2021-10-24 DIAGNOSIS — M545 Low back pain, unspecified: Secondary | ICD-10-CM | POA: Diagnosis not present

## 2021-10-24 DIAGNOSIS — M79604 Pain in right leg: Secondary | ICD-10-CM | POA: Diagnosis not present

## 2021-10-24 DIAGNOSIS — G8929 Other chronic pain: Secondary | ICD-10-CM | POA: Diagnosis not present

## 2021-10-24 DIAGNOSIS — M79605 Pain in left leg: Secondary | ICD-10-CM | POA: Diagnosis not present

## 2021-10-24 DIAGNOSIS — Z79899 Other long term (current) drug therapy: Secondary | ICD-10-CM | POA: Diagnosis not present

## 2021-10-24 DIAGNOSIS — M47816 Spondylosis without myelopathy or radiculopathy, lumbar region: Secondary | ICD-10-CM | POA: Diagnosis not present

## 2021-10-24 MED ORDER — HYDROCODONE-ACETAMINOPHEN 5-325 MG PO TABS: 1.0000 | ORAL_TABLET | Freq: Two times a day (BID) | ORAL | 0 refills | Status: DC | PRN

## 2021-10-24 NOTE — Progress Notes (Signed)
Cardiology Office Note    Date:  10/27/2021   ID:  Aniela, Misa [birth year], MRN [id number]  PCP:  Doreen Beam, FNP  Cardiologist:  Nelva Bush, MD  Electrophysiologist:  None   Chief Complaint: Follow-up  History of Present Illness:   Carmen [name] is a 81 y.o. female with history of CAD s/p CABG and PCI, persistent Afib, CVA, PVD, HTN, HLD, hypothyroidism, anemia, chronic pain syndrome, depression, and anxiety who presents for follow up of CAD and A. fib.   She recently moved to [place] from [place] to be closer to family. She was evaluated as a new patient by Dr. Saunders Revel on 11/24/2020 at the request of her PCP. She previously underwent 4-vessel CABG in 2005 with LIMA to LAD, sequential SVG to diagonal and OM, and SVG to PDA. A year later, there was failure of multiple grafts and she underwent PCI/DES (Taxus stents) to the proximal LAD, mid LCx, and RCA x 2 in 2006. These details were provided by cards related to her cardiac interventions by her daughter. Prior to establishing with Dr. Saunders Revel, it was noted she had not been followed by a cardiologist for many years, but was seeing her PCP in [place], [place] regularly.   It was noted she had been on Eliquis in the setting of Afib/flutter and was prescribed 5 mg bid, though her daughter, a pharmacist in [place], decreased the patient's dosage to 2.5 mg, as she felt this dose was more appropriate for her mother.   Prior to moving to [place], it was noted she had been extremely weak and had innumerable falls for at least a year. In this setting, she was hospitalized in 10/2020, due to encephalopathy, falls, and failure to thrive. Imaging showed evidence of prior infarcts. She was subsequently seen by her PCP in the office in 11/2020, and was noted to be in Afib. She was advised to go to the ED for further evaluation in the setting of innumerable falls and increasing dizziness in the setting of Afib. The patient declined.   At her visit to establish care  with our office on 11/24/2020, she noted some exertional dyspnea and chest pain that she attributed to her prior falls that had improved. She complained of intermittent lightheadedness and cramping of the left calf, particularly at night. She reported having been previously been prescribed cilostazol. It was noted she had difficulty staying well hydrated and was regularly given Pedialyte by her daughter. Without this, she reported frequent nausea. She was noted to be in rate-controlled Afib, which had been present for at least a few months, and possibly longer. It was noted, she was still on subtherapeutic dosed Eliquis and this was increased to 5 mg bid.   She underwent echo on 12/14/2020 that showed an EF of 55-60%, no RWMA, moderate LVH, normal RVSF and ventricular size, normal PASP, mildly dilated left atrium, and mild mitral regurgitation. Lower extremity ABIs were normal bilaterally.  Following this, PCP has discontinued gemfibrozil and cilostazol.     She was seen in the office in 11/2020 and was doing well from a cardiac perspective.  She continued to note some positional dizziness, though this was improved on lower dose of Lotrel.  She was tolerating Eliquis.  In the setting of continued positional dizziness and hypotension, Toprol-XL was decreased to 50 mg.  She was last seen in our office in 03/2021 and was doing quite well from a cardiac perspective.  She noted an increase in her functional status with  a decrease in underlying fatigue following the decrease dose of Toprol.  She prefer to avoid any procedures, and in the context of her being asymptomatic with A. fib rate control strategy was pursued.  She was recently evaluated by her PCP on 10/18/2021 with bilateral pedal edema for 4 months.  She denied any shortness of breath.  By their scale, her weight was up 2 pounds when compared to visit in 07/2021.  She reported taking an OTC diuretic.  BNP 300.  Chest x-ray was ordered and remains pending.   Swelling was suspected to be in the setting of amlodipine use.  She was started on HCTZ with recommendation to discontinue OTC diuretic, use compression stockings, and elevate legs.    She comes in accompanied by one of her daughters and reports that she is doing well from a cardiac perspective.  Her daughter has noted some mild bilateral pedal/ankle swelling with the left being greater than the right.  There is prior history of left ankle fracture.  She does elevate her legs when sitting.  Not currently wearing compression stockings.  No angina, dyspnea, palpitations, dizziness, presyncope, or syncope.  She has had several mechanical falls since she was last seen.  Following the initiation of HCTZ, by outside office, she has not noted any change in her pedal edema.  She has not noticed an increase in urine output.  Her weight is up 6 pounds today when compared to her last visit in our office in 03/2021.     Labs independently reviewed: 10/2021 - magnesium 1.6, TSH normal, potassium 4.0, BUN 16, serum creatinine 0.82, albumin 4.2, AST/ALT normal, Hgb 12.2, PLT 225 07/2021 - direct LDL 99, TC 182, TG 292, HDL 50 10/2020 - A1c 5.8    Past Medical History:  Diagnosis Date   Allergy    Anemia    Anxiety    Arthritis    Back pain    Coronary artery disease    Depression    Hypertension    Peripheral vascular disease (HCC)    Stroke ([facility])    Thyroid disease     Past Surgical History:  Procedure Laterality Date   back injections     CARDIAC CATHETERIZATION     CORONARY ANGIOPLASTY     CORONARY ARTERY BYPASS GRAFT      Current Medications: Current Meds  Medication Sig   apixaban (ELIQUIS) 5 MG TABS tablet Take 1 tablet (5 mg total) by mouth 2 (two) times daily.   aspirin EC 81 MG tablet Take 81 mg by mouth daily. Swallow whole.   atorvastatin (LIPITOR) 20 MG tablet TAKE 1 TABLET BY MOUTH EVERYDAY AT BEDTIME   calcium carbonate (OSCAL) 1500 (600 Ca) MG TABS tablet Take 1 tablet (1,500 mg  total) by mouth 2 (two) times daily with a meal.   cetirizine (ZYRTEC) 10 MG tablet Take 10 mg by mouth daily.   clonazePAM (KLONOPIN) 0.5 MG tablet TAKE 1 TABLET BY MOUTH EVERY DAY AS NEEDED FOR ANXIETY   hydrochlorothiazide (MICROZIDE) 12.5 MG capsule Take 1 capsule (12.5 mg total) by mouth daily.   [START ON 10/30/2021] HYDROcodone-acetaminophen (NORCO/VICODIN) 5-325 MG tablet Take 1 tablet by mouth 2 (two) times daily as needed for severe pain. Must last 30 days.   levothyroxine (SYNTHROID) 100 MCG tablet Take 100 mcg by mouth daily before breakfast.   metoprolol succinate (TOPROL-XL) 100 MG 24 hr tablet Take 50 mg by mouth daily.   traZODone (DESYREL) 100 MG tablet Take 1 tablet (100  mg total) by mouth at bedtime as needed for sleep.   [DISCONTINUED] amLODipine-benazepril (LOTREL) 5-10 MG capsule TAKE 1 CAPSULE BY MOUTH EVERY DAY    Allergies:   Patient has no known allergies.   Social History   Socioeconomic History   Marital status: Widowed    Spouse name: Not on file   Number of children: Not on file   Years of education: Not on file   Highest education level: Not on file  Occupational History   Not on file  Tobacco Use   Smoking status: Former    Types: Cigarettes    Quit date: 05/2020    Years since quitting: 1.4   Smokeless tobacco: Never   Tobacco comments:    Smoked about 1 pack per month  Vaping Use   Vaping Use: Never used  Substance and Sexual Activity   Alcohol use: Not Currently   Drug use: Never   Sexual activity: Not on file  Other Topics Concern   Not on file  Social History Narrative   ** Merged History Encounter **       Social Determinants of Health   Financial Resource Strain: Low Risk    Difficulty of Paying Living Expenses: Not hard at all  Food Insecurity: No Food Insecurity   Worried About Charity fundraiser in the Last Year: Never true   [place] in the Last Year: Never true  Transportation Needs: No Transportation Needs   Lack  of Transportation (Medical): No   Lack of Transportation (Non-Medical): No  Physical Activity: Not on file  Stress: Not on file  Social Connections: Not on file     Family History:  The patient's family history includes Bipolar disorder in her daughter; Breast cancer in her mother; Heart attack in her father. There is no history of Diabetes Mellitus II.  ROS:   Review of Systems  Constitutional:  Positive for malaise/fatigue. Negative for chills, diaphoresis, fever and weight loss.  HENT:  Negative for congestion.   Eyes:  Negative for discharge and redness.  Respiratory:  Negative for cough, sputum production, shortness of breath and wheezing.   Cardiovascular:  Positive for leg swelling. Negative for chest pain, palpitations, orthopnea, claudication and PND.  Gastrointestinal:  Negative for blood in stool and melena.  Musculoskeletal:  Positive for falls. Negative for myalgias.  Skin:  Negative for rash.  Neurological:  Negative for dizziness, tingling, tremors, sensory change, speech change, focal weakness, loss of consciousness and weakness.  Endo/Heme/Allergies:  Does not bruise/bleed easily.  Psychiatric/Behavioral:  Negative for substance abuse. The patient is not nervous/anxious.   All other systems reviewed and are negative.   EKGs/Labs/Other Studies Reviewed:    Studies reviewed were summarized above. The additional studies were reviewed today:  2D echo 12/14/2020: 1. Left ventricular ejection fraction, by estimation, is 55 to 60%. The  left ventricle has normal function. The left ventricle has no regional  wall motion abnormalities. There is moderate left ventricular hypertrophy.  Left ventricular diastolic  parameters are indeterminate.   2. Right ventricular systolic function is normal. The right ventricular  size is normal. There is normal pulmonary artery systolic pressure. The  estimated right ventricular systolic pressure is [id number] mmHg.   3. Left atrial size was  mildly dilated.   4. The mitral valve is normal in structure. Mild mitral valve  Regurgitation. __________   Bilateral lower extremity ABI 12/14/2020: Summary:  Right: Resting right ankle-brachial index is within normal range. No  evidence of significant right lower extremity arterial disease. The right  toe-brachial index is normal.   Left: Resting left ankle-brachial index is within normal range. No  evidence of significant left lower extremity arterial disease. The left  toe-brachial index is normal.   EKG:  EKG is ordered today.  The EKG ordered today demonstrates A. fib, 79 bpm, poor R wave progression along the precordial leads, nonspecific  Recent Labs: 10/18/2021: ALT 14; BUN 16; Creatinine, Ser 0.82; Hemoglobin 12.2; Magnesium 1.6; Platelets 225.0; Potassium 4.0; Pro B Natriuretic peptide (BNP) 300.0; Sodium 138; TSH 2.62  Recent Lipid Panel    Component Value Date/Time   CHOL 182 07/26/2021 1432   TRIG 292.0 (H) 07/26/2021 1432   HDL 50.10 07/26/2021 1432   CHOLHDL 4 07/26/2021 1432   VLDL 58.4 (H) 07/26/2021 1432   LDLDIRECT 99.0 07/26/2021 1432    PHYSICAL EXAM:    VS:  BP 100/64 (BP Location: Left Arm, Patient Position: Sitting, Cuff Size: Normal)    Pulse 79    Ht 5\' 1"  (1.549 m)    Wt 187 lb (84.8 kg)    SpO2 98%    BMI 35.33 kg/m   BMI: Body mass index is 35.33 kg/m.  Physical Exam Vitals reviewed.  Constitutional:      Appearance: She is well-developed.  HENT:     Head: Normocephalic and atraumatic.  Eyes:     General:        Right eye: No discharge.        Left eye: No discharge.  Neck:     Vascular: No JVD.  Cardiovascular:     Rate and Rhythm: Normal rate. Rhythm irregularly irregular.     Pulses:          Posterior tibial pulses are 2+ on the right side and 2+ on the left side.     Heart sounds: Normal heart sounds, S1 normal and S2 normal. Heart sounds not distant. No midsystolic click and no opening snap. No murmur heard.   No friction rub.   Pulmonary:     Effort: Pulmonary effort is normal. No respiratory distress.     Breath sounds: Normal breath sounds. No decreased breath sounds, wheezing or rales.  Chest:     Chest wall: No tenderness.  Abdominal:     General: There is no distension.     Palpations: Abdomen is soft.     Tenderness: There is no abdominal tenderness.  Musculoskeletal:     Cervical back: Normal range of motion.     Right lower leg: Edema present.     Left lower leg: Edema present.     Comments: Trivial bilateral ankle/pedal edema  Skin:    General: Skin is warm and dry.     Nails: There is no clubbing.  Neurological:     Mental Status: She is alert and oriented to person, place, and time.  Psychiatric:        Speech: Speech normal.        Behavior: Behavior normal.        Thought Content: Thought content normal.        Judgment: Judgment normal.    Wt Readings from Last 3 Encounters:  10/27/21 187 lb (84.8 kg)  10/24/21 187 lb (84.8 kg)  10/18/21 187 lb 9.6 oz (85.1 kg)     ASSESSMENT & PLAN:   CAD status post CABG with subsequent PCI without angina: She is doing well without any symptoms concerning for angina.  She  remains on apixaban and aspirin as directed by interventional cardiology given CABG with subsequent PCI without symptoms of bleeding.  Continue secondary prevention and aggressive risk factor modification including atorvastatin and metoprolol.  No plans for ischemic evaluation at this time  Persistent A. fib: She remains in A. fib with controlled ventricular response on Toprol-XL.  Given she has been, and in the context of the patient's preference to avoid invasive procedures, rate control strategy has been pursued.  Given her CHA2DS2-VASc of at least 6 she remains on apixaban without symptoms concerning for bleeding.  If she has an increase in fall burden we may need to revisit [place].    Pedal edema: Mild on exam.  It is reasonable for her to remain on HCTZ at this time.  We will  discontinue amlodipine, which is likely contributing to her pedal edema.  Recommend leg elevation and compression stockings.  Check BMP given recent initiation of HCTZ.  HTN: Blood pressure is on the softer side today.  Discontinue Lotrel.  Otherwise, she remains on HCTZ and Toprol-XL.  HLD: LDL 99.  She remains on atorvastatin.    Disposition: F/u with Dr. Saunders Revel or an APP in 1 month.   Medication Adjustments/Labs and Tests Ordered: Current medicines are reviewed at length with the patient today.  Concerns regarding medicines are outlined above. Medication changes, Labs and Tests ordered today are summarized above and listed in the Patient Instructions accessible in Encounters.   Signed, Christell Faith, PA-C 10/27/2021 12:33 PM     [place], [place] [address] [phone]

## 2021-10-24 NOTE — Patient Instructions (Addendum)
____________________________________________________________________________________________ ° °Medication Rules ° °Purpose: To inform patients, and their family members, of our rules and regulations. ° °Applies to: All patients receiving prescriptions (written or electronic). ° °Pharmacy of record: Pharmacy where electronic prescriptions will be sent. If written prescriptions are taken to a different pharmacy, please inform the nursing staff. The pharmacy listed in the electronic medical record should be the one where you would like electronic prescriptions to be sent. ° °Electronic prescriptions: In compliance with the [place] [employer]rengthen Opioid Misuse Prevention (STOP) Act of 2017 (Session Law 2017-74/H243), effective October 02, 2018, all controlled substances must be electronically prescribed. Calling prescriptions to the pharmacy will cease to exist. ° °Prescription refills: Only during scheduled appointments. Applies to all prescriptions. ° °NOTE: The following applies primarily to controlled substances (Opioid* Pain Medications).  ° °Type of encounter (visit): For patients receiving controlled substances, face-to-face visits are required. (Not an option or up to the patient.) ° °Patient's responsibilities: °Pain Pills: Bring all pain pills to every appointment (except for procedure appointments). °Pill Bottles: Bring pills in original pharmacy bottle. Always bring the newest bottle. Bring bottle, even if empty. °Medication refills: You are responsible for knowing and keeping track of what medications you take and those you need refilled. °The day before your appointment: write a list of all prescriptions that need to be refilled. °The day of the appointment: give the list to the admitting nurse. Prescriptions will be written only during appointments. No prescriptions will be written on procedure days. °If you forget a medication: it will not be "Called in", "Faxed", or "electronically sent". You will  need to get another appointment to get these prescribed. °No early refills. Do not call asking to have your prescription filled early. °Prescription Accuracy: You are responsible for carefully inspecting your prescriptions before leaving our office. Have the discharge nurse carefully go over each prescription with you, before taking them home. Make sure that your name is accurately spelled, that your address is correct. Check the name and dose of your medication to make sure it is accurate. Check the number of pills, and the written instructions to make sure they are clear and accurate. Make sure that you are given enough medication to last until your next medication refill appointment. °Taking Medication: Take medication as prescribed. When it comes to controlled substances, taking less pills or less frequently than prescribed is permitted and encouraged. °Never take more pills than instructed. °Never take medication more frequently than prescribed.  °Inform other Doctors: Always inform, all of your healthcare providers, of all the medications you take. °Pain Medication from other Providers: You are not allowed to accept any additional pain medication from any other Doctor or Healthcare provider. There are two exceptions to this rule. (see below) In the event that you require additional pain medication, you are responsible for notifying us, as stated below. °Cough Medicine: Often these contain an opioid, such as codeine or hydrocodone. Never accept or take cough medicine containing these opioids if you are already taking an opioid* medication. The combination may cause respiratory failure and death. °Medication Agreement: You are responsible for carefully reading and following our Medication Agreement. This must be signed before receiving any prescriptions from our practice. Safely store a copy of your signed Agreement. Violations to the Agreement will result in no further prescriptions. (Additional copies of our  Medication Agreement are available upon request.) °Laws, Rules, & Regulations: All patients are expected to follow all Federal and State Laws, Statutes, Rules, & Regulations. Ignorance of   the Laws does not constitute a valid excuse.  Illegal drugs and Controlled Substances: The use of illegal substances (including, but not limited to marijuana and its derivatives) and/or the illegal use of any controlled substances is strictly prohibited. Violation of this rule may result in the immediate and permanent discontinuation of any and all prescriptions being written by our practice. The use of any illegal substances is prohibited. Adopted CDC guidelines & recommendations: Target dosing levels will be at or below 60 MME/day. Use of benzodiazepines** is not recommended.  Exceptions: There are only two exceptions to the rule of not receiving pain medications from other Healthcare Providers. Exception #1 (Emergencies): In the event of an emergency (i.e.: accident requiring emergency care), you are allowed to receive additional pain medication. However, you are ______________________________________________________________________  Preparing for your procedure (without sedation)  Procedure appointments are limited to planned procedures: No Prescription Refills. No disability issues will be discussed. No medication changes will be discussed.  Instructions: Oral Intake: Do not eat or drink anything for at least 6 hours prior to your procedure. (Exception: Blood Pressure Medication. See below.) Transportation: Unless otherwise stated by your physician, you may drive yourself after the procedure. Blood Pressure Medicine: Do not forget to take your blood pressure medicine with a sip of water the morning of the procedure. If your Diastolic (lower reading)is above 100 mmHg, elective cases will be cancelled/rescheduled. Blood thinners: These will need to be stopped for procedures. Notify our staff if you are taking  any blood thinners. Depending on which one you take, there will be specific instructions on how and when to stop it. Diabetics on insulin: Notify the staff so that you can be scheduled 1st case in the morning. If your diabetes requires high dose insulin, take only  of your normal insulin dose the morning of the procedure and notify the staff that you have done so. Preventing infections: Shower with an antibacterial soap the morning of your procedure.  Build-up your immune system: Take 1000 mg of Vitamin C with every meal (3 times a day) the day prior to your procedure. Antibiotics: Inform the staff if you have a condition or reason that requires you to take antibiotics before dental procedures. Pregnancy: If you are pregnant, call and cancel the procedure. Sickness: If you have a cold, fever, or any active infections, call and cancel the procedure. Arrival: You must be in the facility at least 30 minutes prior to your scheduled procedure. Children: Do not bring any children with you. Dress appropriately: Bring dark clothing that you would not mind if they get stained. Valuables: Do not bring any jewelry or valuables.  Reasons to call and reschedule or cancel your procedure: (Following these recommendations will minimize the risk of a serious complication.) Surgeries: Avoid having procedures within 2 weeks of any surgery. (Avoid for 2 weeks before or after any surgery). Flu Shots: Avoid having procedures within 2 weeks of a flu shots or . (Avoid for 2 weeks before or after immunizations). Barium: Avoid having a procedure within 7-10 days after having had a radiological study involving the use of radiological contrast. (Myelograms, Barium swallow or enema study). Heart attacks: Avoid any elective procedures or surgeries for the initial 6 months after a "Myocardial Infarction" (Heart Attack). Blood thinners: It is imperative that you stop these medications before procedures. Let us know if you if you  take any blood thinner.  Infection: Avoid procedures during or within two weeks of an infection (including chest colds or gastrointestinal  problems). Symptoms associated with infections include: Localized redness, fever, chills, night sweats or profuse sweating, burning sensation when voiding, cough, congestion, stuffiness, runny nose, sore throat, diarrhea, nausea, vomiting, cold or Flu symptoms, recent or current infections. It is specially important if the infection is over the area that we intend to treat. Heart and lung problems: Symptoms that may suggest an active cardiopulmonary problem include: cough, chest pain, breathing difficulties or shortness of breath, dizziness, ankle swelling, uncontrolled high or unusually low blood pressure, and/or palpitations. If you are experiencing any of these symptoms, cancel your procedure and contact your primary care physician for an evaluation.  Remember:  Regular Business hours are:  Monday to Thursday 8:00 AM to 4:00 PM  Provider's Schedule: Delano Metz, MD:  Procedure days: Tuesday and Thursday 7:30 AM to 4:00 PM  Edward Jolly, MD:  Procedure days: Monday and Wednesday 7:30 AM to 4:00 PM ______________________________________________________________________  Management for Surgeons" handout, and giving it to your surgeon or dentist. This document is available at our office, and does not require an appointment to obtain it. Simply go to our office during business hours (Monday-Thursday from 8:00 AM to 4:00 PM) (Friday 8:00 AM to 12:00 Noon) or if you have a scheduled appointment with [place], prior to your surgery, and ask for it by name. In addition, you are responsible for: calling our office [phone], at any time of the day or night, and leaving a message stating your name, name of your surgeon, type of surgery, and date of procedure or surgery. Failure to comply with your responsibilities may result in termination of therapy involving the  controlled substances. Medication Agreement Violation. Following the above rules, including your responsibilities will help you in avoiding a Medication Agreement Violation (Breaking your Pain Medication Contract).  *Opioid medications include: morphine, codeine, oxycodone, oxymorphone, hydrocodone, hydromorphone, meperidine, tramadol, tapentadol, buprenorphine, fentanyl, methadone. **Benzodiazepine medications include: diazepam (Valium), alprazolam (Xanax), clonazepam (Klonopine), lorazepam (Ativan), clorazepate (Tranxene), chlordiazepoxide (Librium), estazolam (Prosom), oxazepam (Serax), temazepam (Restoril), triazolam (Halcion) (Last updated: 06/29/2021) ____________________________________________________________________________________________  ____________________________________________________________________________________________  Medication Recommendations and Reminders  Applies to: All patients receiving prescriptions (written and/or electronic).  Medication Rules & Regulations: These rules and regulations exist for your safety and that of others. They are not flexible and neither are we. Dismissing or ignoring them will be considered "non-compliance" with medication therapy, resulting in complete and irreversible termination of such therapy. (See document titled "Medication Rules" for more details.) In all conscience, because of safety reasons, we cannot continue providing a therapy where the patient does not follow instructions.  Pharmacy of record:  Definition: This is the pharmacy where your electronic prescriptions will be sent.  We do not endorse any particular pharmacy, however, we have experienced problems with [facility] not securing enough medication supply for the community. We do not restrict you in your choice of pharmacy. However, once we write for your prescriptions, we will NOT be re-sending more prescriptions to fix restricted supply problems created by your pharmacy,  or your insurance.  The pharmacy listed in the electronic medical record should be the one where you want electronic prescriptions to be sent. If you choose to change pharmacy, simply notify our nursing staff.  Recommendations: Keep all of your pain medications in a safe place, under lock and key, even if you live alone. We will NOT replace lost, stolen, or damaged medication. After you fill your prescription, take 1 week's worth of pills and put them away in a safe place. You should keep a  separate, properly labeled bottle for this purpose. The remainder should be kept in the original bottle. Use this as your primary supply, until it runs out. Once it's gone, then you know that you have 1 week's worth of medicine, and it is time to come in for a prescription refill. If you do this correctly, it is unlikely that you will ever run out of medicine. To make sure that the above recommendation works, it is very important that you make sure your medication refill appointments are scheduled at least 1 week before you run out of medicine. To do this in an effective manner, make sure that you do not leave the office without scheduling your next medication management appointment. Always ask the nursing staff to show you in your prescription , when your medication will be running out. Then arrange for the receptionist to get you a return appointment, at least 7 days before you run out of medicine. Do not wait until you have 1 or 2 pills left, to come in. This is very poor planning and does not take into consideration that we may need to cancel appointments due to bad weather, sickness, or emergencies affecting our staff. DO NOT ACCEPT A "Partial Fill": If for any reason your pharmacy does not have enough pills/tablets to completely fill or refill your prescription, do not allow for a "partial fill". The law allows the pharmacy to complete that prescription within 72 hours, without requiring a new prescription. If they  do not fill the rest of your prescription within those 72 hours, you will need a separate prescription to fill the remaining amount, which we will NOT provide. If the reason for the partial fill is your insurance, you will need to talk to the pharmacist about payment alternatives for the remaining tablets, but again, DO NOT ACCEPT A PARTIAL FILL, unless you can trust your pharmacist to obtain the remainder of the pills within 72 hours.  Prescription refills and/or changes in medication(s):  Prescription refills, and/or changes in dose or medication, will be conducted only during scheduled medication management appointments. (Applies to both, written and electronic prescriptions.) No refills on procedure days. No medication will be changed or started on procedure days. No changes, adjustments, and/or refills will be conducted on a procedure day. Doing so will interfere with the diagnostic portion of the procedure. No phone refills. No medications will be "called into the pharmacy". No Fax refills. No weekend refills. No Holliday refills. No after hours refills.  Remember:  Business hours are:  Monday to Thursday 8:00 AM to 4:00 PM Provider's Schedule: Delano MetzFrancisco Naveira, MD - Appointments are:  Medication management: Monday and Wednesday 8:00 AM to 4:00 PM Procedure day: Tuesday and Thursday 7:30 AM to 4:00 PM Edward JollyBilal Lateef, MD - Appointments are:  Medication management: Tuesday and Thursday 8:00 AM to 4:00 PM Procedure day: Monday and Wednesday 7:30 AM to 4:00 PM (Last update: 04/21/2020) ____________________________________________________________________________________________  ____________________________________________________________________________________________  CBD (cannabidiol) & Delta-8 (Delta-8 tetrahydrocannabinol) WARNING  Intro: Cannabidiol (CBD) and tetrahydrocannabinol (THC), are two natural compounds found in plants of the Cannabis genus. They can both be extracted from  hemp or cannabis. Hemp and cannabis come from the Cannabis sativa plant. Both compounds interact with your bodys endocannabinoid system, but they have very different effects. CBD does not produce the high sensation associated with cannabis. Delta-8 tetrahydrocannabinol, also known as delta-8 THC, is a psychoactive substance found in the Cannabis sativa plant, of which marijuana and hemp are two varieties. THC is responsible  for the high associated with the illicit use of marijuana.  Applicable to: All individuals currently taking or considering taking CBD (cannabidiol) and, more important, all patients taking opioid analgesic controlled substances (pain medication). (Example: oxycodone; oxymorphone; hydrocodone; hydromorphone; morphine; methadone; tramadol; tapentadol; fentanyl; buprenorphine; butorphanol; dextromethorphan; meperidine; codeine; etc.)  Legal status: CBD remains a Schedule I drug prohibited for any use. CBD is illegal with one exception. In the [place], CBD has a limited Education officer, environmental (FDA) approval for the treatment of two specific types of epilepsy disorders. Only one CBD product has been approved by the FDA for this purpose: "Epidiolex". FDA is aware that some companies are marketing products containing cannabis and cannabis-derived compounds in ways that violate the [employer], Drug and Cosmetic Act [facility] Act) and that may put the health and safety of consumers at risk. The FDA, a Federal agency, has not enforced the CBD status since 2018.   Legality: Some manufacturers ship CBD products nationally, which is illegal. Often such products are sold online and are therefore available throughout the country. CBD is openly sold in head shops and health food stores in some states where such sales have not been explicitly legalized. Selling unapproved products with unsubstantiated therapeutic claims is not only a violation of the law, but also can put patients at risk, as  these products have not been proven to be safe or effective. Federal illegality makes it difficult to conduct research on CBD.  Reference: "FDA Regulation of Cannabis and Cannabis-Derived Products, Including Cannabidiol (CBD)" - [employer]  Warning: CBD is not FDA approved and has not undergo the same manufacturing controls as prescription drugs.  This means that the purity and safety of available CBD may be questionable. Most of the time, despite manufacturer's claims, it is contaminated with THC (delta-9-tetrahydrocannabinol - the chemical in marijuana responsible for the "HIGH").  When this is the case, the [facility] contaminant will trigger a positive urine drug screen (UDS) test for Marijuana (carboxy-THC). Because a positive UDS for any illicit substance is a violation of our medication agreement, your opioid analgesics (pain medicine) may be permanently discontinued. The FDA recently put out a warning about 5 things that everyone should be aware of regarding Delta-8 THC: Delta-8 THC products have not been evaluated or approved by the FDA for safe use and may be marketed in ways that put the public health at risk. The FDA has received adverse event reports involving delta-8 THC-containing products. Delta-8 THC has psychoactive and intoxicating effects. Delta-8 THC manufacturing often involve use of potentially harmful chemicals to create the concentrations of delta-8 THC claimed in the marketplace. The final delta-8 THC product may have potentially harmful by-products (contaminants) due to the chemicals used in the process. Manufacturing of delta-8 THC products may occur in uncontrolled or unsanitary settings, which may lead to the presence of unsafe contaminants or other potentially harmful substances. Delta-8 THC products should be kept out of the reach of children and pets.  MORE  ABOUT CBD  General Information: CBD was discovered in [birth year] and it is a derivative of the cannabis sativa genus plants (Marijuana and Hemp). It is one of the 113 identified substances found in Marijuana. It accounts for up to 40% of the plant's extract. As of 2018, preliminary clinical studies on CBD included research for the treatment of anxiety, movement disorders, and pain. CBD is available and consumed in multiple forms, including inhalation of smoke or vapor, as an aerosol spray, and by mouth. It may be  supplied as an oil containing CBD, capsules, dried cannabis, or as a liquid solution. CBD is thought not to be as psychoactive as THC (delta-9-tetrahydrocannabinol - the chemical in marijuana responsible for the "HIGH"). Studies suggest that CBD may interact with different biological target receptors in the body, including cannabinoid and other neurotransmitter receptors. As of 2018 the mechanism of action for its biological effects has not been determined.  Side-effects   Adverse reactions: Dry mouth, diarrhea, decreased appetite, fatigue, drowsiness, malaise, weakness, sleep disturbances, and others.  Drug interactions: CBC may interact with other medications such as blood-thinners. (Last update: 07/01/2021) ____________________________________________________________________________________________  ____________________________________________________________________________________________  Drug Holidays (Slow)  What is a "Drug Holiday"? Drug Holiday: is the name given to the period of time during which a patient stops taking a medication(s) for the purpose of eliminating tolerance to the drug.  Benefits Improved effectiveness of opioids. Decreased opioid dose needed to achieve benefits. Improved pain with lesser dose.  What is tolerance? Tolerance: is the progressive decreased in effectiveness of a drug due to its repetitive use. With repetitive use, the body gets use to the medication  and as a consequence, it loses its effectiveness. This is a common problem seen with opioid pain medications. As a result, a larger dose of the drug is needed to achieve the same effect that used to be obtained with a smaller dose.  How long should a "Drug Holiday" last? You should stay off of the pain medicine for at least 14 consecutive days. (2 weeks)  Should I stop the medicine "cold [place]"? No. You should always coordinate with your Pain Specialist so that he/she can provide you with the correct medication dose to make the transition as smoothly as possible.  How do I stop the medicine? Slowly. You will be instructed to decrease the daily amount of pills that you take by one (1) pill every seven (7) days. This is called a "slow downward taper" of your dose. For example: if you normally take four (4) pills per day, you will be asked to drop this dose to three (3) pills per day for seven (7) days, then to two (2) pills per day for seven (7) days, then to one (1) per day for seven (7) days, and at the end of those last seven (7) days, this is when the "Drug Holiday" would start.   Will I have withdrawals? By doing a "slow downward taper" like this one, it is unlikely that you will experience any significant withdrawal symptoms. Typically, what triggers withdrawals is the sudden stop of a high dose opioid therapy. Withdrawals can usually be avoided by slowly decreasing the dose over a prolonged period of time. If you do not follow these instructions and decide to stop your medication abruptly, withdrawals may be possible.  What are withdrawals? Withdrawals: refers to the wide range of symptoms that occur after stopping or dramatically reducing opiate drugs after heavy and prolonged use. Withdrawal symptoms do not occur to patients that use low dose opioids, or those who take the medication sporadically. Contrary to benzodiazepine (example: Valium, Xanax, etc.) or alcohol withdrawals (Delirium  T[place]), opioid withdrawals are not lethal. Withdrawals are the physical manifestation of the body getting rid of the excess receptors.  Expected Symptoms Early symptoms of withdrawal may include: Agitation Anxiety Muscle aches Increased tearing Insomnia Runny nose Sweating Yawning  Late symptoms of withdrawal may include: Abdominal cramping Diarrhea Dilated pupils Goose bumps Nausea Vomiting  Will I experience withdrawals? Due to the slow nature  of the taper, it is very unlikely that you will experience any.  What is a slow taper? Taper: refers to the gradual decrease in dose.  (Last update: 04/21/2020) ____________________________________________________________________________________________

## 2021-10-24 NOTE — Progress Notes (Signed)
Nursing Pain Medication Assessment:  Safety precautions to be maintained throughout the outpatient stay will include: orient to surroundings, keep bed in low position, maintain call bell within reach at all times, provide assistance with transfer out of bed and ambulation.  Medication Inspection Compliance: Pill count conducted under aseptic conditions, in front of the patient. Neither the pills nor the bottle was removed from the patient's sight at any time. Once count was completed pills were immediately returned to the patient in their original bottle.  Medication: Hydrocodone/APAP Pill/Patch Count:  8 of 60 pills remain Pill/Patch Appearance: Markings consistent with prescribed medication Bottle Appearance: Standard pharmacy container. Clearly labeled. Filled Date: 60 / 30 / 2023 Last Medication intake:  Today

## 2021-10-27 ENCOUNTER — Ambulatory Visit: Payer: Medicare Other | Admitting: Physician Assistant

## 2021-10-27 ENCOUNTER — Encounter: Payer: Self-pay | Admitting: Physician Assistant

## 2021-10-27 ENCOUNTER — Other Ambulatory Visit: Payer: Self-pay

## 2021-10-27 VITALS — BP 100/64 | HR 79 | Ht 61.0 in | Wt 187.0 lb

## 2021-10-27 DIAGNOSIS — R600 Localized edema: Secondary | ICD-10-CM

## 2021-10-27 DIAGNOSIS — I4819 Other persistent atrial fibrillation: Secondary | ICD-10-CM | POA: Diagnosis not present

## 2021-10-27 DIAGNOSIS — I25810 Atherosclerosis of coronary artery bypass graft(s) without angina pectoris: Secondary | ICD-10-CM | POA: Diagnosis not present

## 2021-10-27 DIAGNOSIS — E785 Hyperlipidemia, unspecified: Secondary | ICD-10-CM

## 2021-10-27 DIAGNOSIS — I10 Essential (primary) hypertension: Secondary | ICD-10-CM | POA: Diagnosis not present

## 2021-10-27 NOTE — Patient Instructions (Signed)
Medication Instructions:  Your physician has recommended you make the following change in your medication:   STOP Lotrel   *If you need a refill on your cardiac medications before your next appointment, please call your pharmacy*   Lab Work: BMET today  If you have labs (blood work) drawn today and your tests are completely normal, you will receive your results only by: MyChart Message (if you have MyChart) OR A paper copy in the mail If you have any lab test that is abnormal or we need to change your treatment, we will call you to review the results.   Testing/Procedures: None   Follow-Up: At [facility], you and your health needs are our priority.  As part of our continuing mission to provide you with exceptional heart care, we have created designated Provider Care Teams.  These Care Teams include your primary Cardiologist (physician) and Advanced Practice Providers (APPs -  Physician Assistants and Nurse Practitioners) who all work together to provide you with the care you need, when you need it.   Your next appointment:   1 month(s)  The format for your next appointment:   In Person  Provider:   Yvonne Kendall, MD or Eula Listen, PA-C

## 2021-10-28 LAB — BASIC METABOLIC PANEL
BUN/Creatinine Ratio: 16 (ref 12–28)
BUN: 15 mg/dL (ref 8–27)
CO2: 22 mmol/L (ref 20–29)
Calcium: 9.2 mg/dL (ref 8.7–10.3)
Chloride: 100 mmol/L (ref 96–106)
Creatinine, Ser: 0.93 mg/dL (ref 0.57–1.00)
Glucose: 108 mg/dL — ABNORMAL HIGH (ref 70–99)
Potassium: 4.5 mmol/L (ref 3.5–5.2)
Sodium: 138 mmol/L (ref 134–144)
eGFR: 63 mL/min/{1.73_m2} (ref >59)

## 2021-10-31 ENCOUNTER — Telehealth: Payer: Self-pay | Admitting: Pain Medicine

## 2021-10-31 NOTE — Telephone Encounter (Signed)
Patient's pharmacy is out of medication and not sure if they will get it in tomorrow. Can this be sent to different pharmacy? Please call patient

## 2021-11-01 ENCOUNTER — Telehealth: Payer: Self-pay | Admitting: Pain Medicine

## 2021-11-01 ENCOUNTER — Telehealth: Payer: Self-pay | Admitting: *Deleted

## 2021-11-01 ENCOUNTER — Other Ambulatory Visit: Payer: Self-pay | Admitting: Pain Medicine

## 2021-11-01 DIAGNOSIS — Z79891 Long term (current) use of opiate analgesic: Secondary | ICD-10-CM

## 2021-11-01 DIAGNOSIS — G8929 Other chronic pain: Secondary | ICD-10-CM

## 2021-11-01 DIAGNOSIS — G894 Chronic pain syndrome: Secondary | ICD-10-CM

## 2021-11-01 DIAGNOSIS — M542 Cervicalgia: Secondary | ICD-10-CM

## 2021-11-01 DIAGNOSIS — M47816 Spondylosis without myelopathy or radiculopathy, lumbar region: Secondary | ICD-10-CM

## 2021-11-01 DIAGNOSIS — Z79899 Other long term (current) drug therapy: Secondary | ICD-10-CM

## 2021-11-01 DIAGNOSIS — M79604 Pain in right leg: Secondary | ICD-10-CM

## 2021-11-01 MED ORDER — HYDROCODONE-ACETAMINOPHEN 5-325 MG PO TABS: 1.0000 | ORAL_TABLET | Freq: Two times a day (BID) | ORAL | 0 refills | Status: DC | PRN

## 2021-11-01 NOTE — Telephone Encounter (Signed)
Spoke with Ms. Lorenda Peck, advised her to call to other pharmacies and check for availability of Hydrocodone.

## 2021-11-01 NOTE — Progress Notes (Signed)
The patient's [facility] pharmacy did not have her medication and therefore they have requested that it be sent to [facility]

## 2021-11-01 NOTE — Telephone Encounter (Signed)
Three prescriptions for Hydrocodone cancelled at [facility] in [place].

## 2021-11-03 ENCOUNTER — Ambulatory Visit
Admission: RE | Admit: 2021-11-03 | Discharge: 2021-11-03 | Disposition: A | Discharge status: Home / Self Care | Payer: Medicare Other | Source: Ambulatory Visit | Attending: Pain Medicine | Admitting: Pain Medicine

## 2021-11-03 ENCOUNTER — Ambulatory Visit (HOSPITAL_BASED_OUTPATIENT_CLINIC_OR_DEPARTMENT_OTHER): Payer: Medicare Other | Admitting: Pain Medicine

## 2021-11-03 ENCOUNTER — Encounter: Payer: Self-pay | Admitting: Pain Medicine

## 2021-11-03 ENCOUNTER — Other Ambulatory Visit: Payer: Self-pay

## 2021-11-03 VITALS — BP 136/99 | HR 87 | Temp 98.2°F | Resp 12 | Ht 61.0 in | Wt 187.0 lb

## 2021-11-03 DIAGNOSIS — M51379 Other intervertebral disc degeneration, lumbosacral region without mention of lumbar back pain or lower extremity pain: Secondary | ICD-10-CM

## 2021-11-03 DIAGNOSIS — Z7901 Long term (current) use of anticoagulants: Secondary | ICD-10-CM

## 2021-11-03 DIAGNOSIS — M47816 Spondylosis without myelopathy or radiculopathy, lumbar region: Secondary | ICD-10-CM

## 2021-11-03 DIAGNOSIS — G8929 Other chronic pain: Secondary | ICD-10-CM | POA: Diagnosis not present

## 2021-11-03 DIAGNOSIS — M545 Low back pain, unspecified: Secondary | ICD-10-CM | POA: Diagnosis not present

## 2021-11-03 DIAGNOSIS — M47817 Spondylosis without myelopathy or radiculopathy, lumbosacral region: Secondary | ICD-10-CM | POA: Insufficient documentation

## 2021-11-03 DIAGNOSIS — M5137 Other intervertebral disc degeneration, lumbosacral region: Secondary | ICD-10-CM | POA: Insufficient documentation

## 2021-11-03 MED ORDER — ROPIVACAINE HCL 2 MG/ML IJ SOLN
18.0000 mL | Freq: Once | INTRAMUSCULAR | Status: AC
  Administered 2021-11-03: 18 mL via PERINEURAL

## 2021-11-03 MED ORDER — ROPIVACAINE HCL 2 MG/ML IJ SOLN
INTRAMUSCULAR | Status: AC
  Filled 2021-11-03: qty 20

## 2021-11-03 MED ORDER — TRIAMCINOLONE ACETONIDE 40 MG/ML IJ SUSP
80.0000 mg | Freq: Once | INTRAMUSCULAR | Status: AC
  Administered 2021-11-03: 80 mg
  Filled 2021-11-03: qty 2

## 2021-11-03 MED ORDER — MIDAZOLAM HCL 5 MG/5ML IJ SOLN
0.5000 mg | Freq: Once | INTRAMUSCULAR | Status: AC
  Administered 2021-11-03: 1.5 mg via INTRAVENOUS
  Filled 2021-11-03: qty 5

## 2021-11-03 MED ORDER — LIDOCAINE HCL 2 % IJ SOLN
20.0000 mL | Freq: Once | INTRAMUSCULAR | Status: AC
  Administered 2021-11-03: 400 mg
  Filled 2021-11-03: qty 20

## 2021-11-03 MED ORDER — LACTATED RINGERS IV SOLN
1000.0000 mL | Freq: Once | INTRAVENOUS | Status: AC
  Administered 2021-11-03: 1000 mL via INTRAVENOUS

## 2021-11-03 MED ORDER — PENTAFLUOROPROP-TETRAFLUOROETH EX AERO
INHALATION_SPRAY | Freq: Once | CUTANEOUS | Status: DC
  Filled 2021-11-03: qty 116

## 2021-11-03 NOTE — Progress Notes (Signed)
Safety precautions to be maintained throughout the outpatient stay will include: orient to surroundings, keep bed in low position, maintain call bell within reach at all times, provide assistance with transfer out of bed and ambulation.  

## 2021-11-03 NOTE — Patient Instructions (Addendum)
____ Restart Eiliquis at least 6 hours post procedure. ________________________________________________________________________________________  Virtual Visits   What is a "Virtual Visit"? It is a Metallurgist (medical visit) that takes place on real time (NOT TEXT or E-MAIL) over the telephone or computer device (desktop, laptop, tablet, smart phone, etc.). It allows for more location flexibility between the patient and the healthcare provider.  Who decides when these types of visits will be used? The physician.  Who is eligible for these types of visits? Only those patients that can be reliably reached over the telephone.  What do you mean by reliably? We do not have time to call everyone multiple times, therefore those that tend to screen calls and then call back later are not suitable candidates for this system. We understand how people are reluctant to pickup on "unknown" calls, therefore, we suggest adding our telephone numbers to your list of "CONTACT(s)". This way, you should be able to readily identify our calls when you receive one. All of our numbers are available below.   Who is not eligible? This option is not available for medication management encounters, specially for controlled substances. Patients on pain medications that fall under the category of controlled substances have to come in for "Face-to-Face" encounters. This is required for mandatory monitoring of these substances. You may be asked to provide a sample for an unannounced urine drug screening test (UDS), and we will need to count your pain pills. Not bringing your pills to be counted may result in no refill. Obviously, neither one of these can be done over the phone.  When will this type of visits be used? You can request a virtual visit whenever you are physically unable to attend a regular appointment. The decision will be made by the physician (or healthcare provider) on a case by case basis.    At what time will I be called? This is an excellent question. The providers will try to call you whenever they have time available. Do not expect to be called at any specific time. The secretaries will assign you a time for your virtual visit appointment, but this is done simply to keep a list of those patients that need to be called, but not for the purpose of keeping a time schedule. Be advised that the call may come in anytime during the day, between the hours of 8:00 AM and 8::00 PM, depending on provider availability. We do understand that the system is not perfect. If you are unable to be available that day on a moments notice, then request an "in-person" appointment rather than a "virtual visit".  Can I request my medication visits to be "Virtual"? Yes you may request it, but the decision is entirely up to the healthcare provider. Control substances require specific monitoring that requires Face-to-Face encounters. The number of encounters  and the extent of the monitoring is determined on a case by case basis.  Add a new contact to your smart phone and label it "PAIN CLINIC" Under this contact add the following numbers: Main: [phone] (Official Contact Number) Nurses: [phone] (These are outgoing only calling systems. Do not call this number.) Dr. Dossie Arbour: [phone] or [phone] (Outgoing calls only. Do not call this number.)  ____________________________________________________________________________________________  ____________________________________________________________________________________________  Post-Procedure Discharge Instructions  Instructions: Apply ice:  Purpose: This will minimize any swelling and discomfort after procedure.  When: Day of procedure, as soon as you get home. How: Fill a plastic sandwich bag with crushed ice. Cover  it with a small towel and apply to injection site. How long: (15 min on, 15 min off) Apply for 15 minutes then  remove x 15 minutes.  Repeat sequence on day of procedure, until you go to bed. Apply heat:  Purpose: To treat any soreness and discomfort from the procedure. When: Starting the next day after the procedure. How: Apply heat to procedure site starting the day following the procedure. How long: May continue to repeat daily, until discomfort goes away. Food intake: Start with clear liquids (like water) and advance to regular food, as tolerated.  Physical activities: Keep activities to a minimum for the first 8 hours after the procedure. After that, then as tolerated. Driving: If you have received any sedation, be responsible and do not drive. You are not allowed to drive for 24 hours after having sedation. Blood thinner: (Applies only to those taking blood thinners) You may restart your blood thinner 6 hours after your procedure. Insulin: (Applies only to Diabetic patients taking insulin) As soon as you can eat, you may resume your normal dosing schedule. Infection prevention: Keep procedure site clean and dry. Shower daily and clean area with soap and water. Post-procedure Pain Diary: Extremely important that this be done correctly and accurately. Recorded information will be used to determine the next step in treatment. For the purpose of accuracy, follow these rules: Evaluate only the area treated. Do not report or include pain from an untreated area. For the purpose of this evaluation, ignore all other areas of pain, except for the treated area. After your procedure, avoid taking a long nap and attempting to complete the pain diary after you wake up. Instead, set your alarm clock to go off every hour, on the hour, for the initial 8 hours after the procedure. Document the duration of the numbing medicine, and the relief you are getting from it. Do not go to sleep and attempt to complete it later. It will not be accurate. If you received sedation, it is likely that you were given a medication that may  cause amnesia. Because of this, completing the diary at a later time may cause the information to be inaccurate. This information is needed to plan your care. Follow-up appointment: Keep your post-procedure follow-up evaluation appointment after the procedure (usually 2 weeks for most procedures, 6 weeks for radiofrequencies). DO NOT FORGET to bring you pain diary with you.   Expect: (What should I expect to see with my procedure?) From numbing medicine (AKA: Local Anesthetics): Numbness or decrease in pain. You may also experience some weakness, which if present, could last for the duration of the local anesthetic. Onset: Full effect within 15 minutes of injected. Duration: It will depend on the type of local anesthetic used. On the average, 1 to 8 hours.  From steroids (Applies only if steroids were used): Decrease in swelling or inflammation. Once inflammation is improved, relief of the pain will follow. Onset of benefits: Depends on the amount of swelling present. The more swelling, the longer it will take for the benefits to be seen. In some cases, up to 10 days. Duration: Steroids will stay in the system x 2 weeks. Duration of benefits will depend on multiple posibilities including persistent irritating factors. Side-effects: If present, they may typically last 2 weeks (the duration of the steroids). Frequent: Cramps (if they occur, drink Gatorade and take over-the-counter Magnesium 450-500 mg once to twice a day); water retention with temporary weight gain; increases in blood sugar; decreased immune  system response; increased appetite. Occasional: Facial flushing (red, warm cheeks); mood swings; menstrual changes. Uncommon: Long-term decrease or suppression of natural hormones; bone thinning. (These are more common with higher doses or more frequent use. This is why we prefer that our patients avoid having any injection therapies in other practices.)  Very Rare: Severe mood changes; psychosis;  aseptic necrosis. From procedure: Some discomfort is to be expected once the numbing medicine wears off. This should be minimal if ice and heat are applied as instructed.  Call if: (When should I call?) You experience numbness and weakness that gets worse with time, as opposed to wearing off. New onset bowel or bladder incontinence. (Applies only to procedures done in the spine)  Emergency Numbers: Durning business hours (Monday - Thursday, 8:00 AM - 4:00 PM) (Friday, 9:00 AM - 12:00 Noon): [phone] After hours: [phone] NOTE: If you are having a problem and are unable connect with, or to talk to a provider, then go to your nearest urgent care or emergency department. If the problem is serious and urgent, please call 911. ____________________________________________________________________________________________

## 2021-11-03 NOTE — Progress Notes (Addendum)
PROVIDER NOTE: Interpretation of information contained herein should be left to medically-trained personnel. Specific patient instructions are provided elsewhere under "Patient Instructions" section of medical record. This document was created in part using STT-dictation technology, any transcriptional errors that may result from this process are unintentional.  Patient: Carmen [name] Type: Established DOB: 1941-09-11 MRN: [id number] PCP: Carmen PapFlinchum, Michelle S, FNP  Service: Procedure DOS: 11/03/2021 Setting: Ambulatory Location: Ambulatory outpatient facility Delivery: Face-to-face Provider: Oswaldo DoneFrancisco A Noma Quijas, MD Specialty: Interventional Pain Management Specialty designation: 09 Location: Outpatient facility Ref. Prov.: [name], Carmen [name]*    Primary Reason for Visit: Interventional Pain Management Treatment. CC: Back Pain (lower)   Procedure:           Type: Lumbar Facet, Medial Branch Block([name]) #2  Laterality: Bilateral  Level: L2, L3, L4, L5, & S1 Medial Branch Level([name]). Injecting these levels blocks the L3-4, L4-5 and L5-S1 lumbar facet joints. Imaging: Fluoroscopic guidance Anesthesia: Local anesthesia (1-2% Lidocaine) Anxiolysis: IV Versed  1.5 mg Sedation: None. DOS: 11/03/2021 Performed by: Carmen DoneFrancisco A Leann Mayweather, MD  Primary Purpose: Diagnostic/Therapeutic Indications: Low back pain severe enough to impact quality of life or function. 1. Lumbar facet joint syndrome (Bilateral)   2. Spondylosis without myelopathy or radiculopathy, lumbosacral region   3. Lumbar facet arthropathy (Multilevel) (Bilateral)   4. Lumbosacral facet hypertrophy (Multilevel) (Bilateral)   5. DDD (degenerative disc disease), lumbosacral   6. 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 (year old), female patient, seen today for interventional treatment. She  has a past surgical history that includes Cardiac catheterization; Coronary angioplasty; Coronary artery bypass graft; and back injections. Carmen [name] has a current medication list which includes the following prescription([name]): apixaban, aspirin ec, atorvastatin, cetirizine, clonazepam, hydrochlorothiazide, hydrocodone-acetaminophen, [START ON 12/01/2021] hydrocodone-acetaminophen, [START ON 12/31/2021] hydrocodone-acetaminophen, levothyroxine, metoprolol succinate, trazodone, and calcium carbonate, and the following Facility-Administered Medications: pentafluoroprop-tetrafluoroeth. Her primarily concern today is the Back Pain (lower)  Initial Vital Signs:  Pulse/HCG Rate: 87ECG Heart Rate: 81 Temp: 98.2 [name] (36.8 C) Resp: 20 BP: (!) 147/92 SpO2: 99 %  BMI: Estimated body mass index is 35.33 kg/m as calculated from the following:   Height as of this encounter: 5\' 1"  (1.549 m).   Weight as of this encounter: 187 lb (84.8 kg).  Risk Assessment: Allergies: Reviewed. She has No Known Allergies.  Allergy Precautions: None required Coagulopathies: Reviewed. None identified.  Blood-thinner therapy: None at this time Active Infection([name]): Reviewed. None identified. Carmen [name] is afebrile  Site Confirmation: Carmen [name] was asked to confirm the procedure and laterality before marking the site Procedure checklist: Completed Consent: Before the procedure and under the influence of no sedative([name]), amnesic([name]), or anxiolytics, the patient was informed of the treatment options,  risks and possible complications. To fulfill our ethical and legal obligations, as recommended by the American Medical Association'[name] Code of Ethics, I have informed the patient of  my clinical impression; the nature and purpose of the treatment or procedure; the risks, benefits, and possible complications of the intervention; the alternatives, including doing nothing; the risk([name]) and benefit([name]) of the alternative treatment([name]) or procedure([name]); and the risk([name]) and benefit([name]) of doing nothing. The patient was provided information about the general risks and possible complications associated with the procedure. These may include, but are not limited to: failure to achieve desired goals, infection, bleeding, organ or nerve damage, allergic reactions, paralysis, and death. In addition, the patient was informed of those risks and complications associated to Spine-related procedures, such as failure to decrease pain; infection (i.e.: Meningitis, epidural or intraspinal abscess); bleeding (i.e.: epidural hematoma, subarachnoid hemorrhage, or any other type of intraspinal or peri-dural bleeding); organ or nerve damage (i.e.: Any type of peripheral nerve, nerve root, or spinal cord injury) with subsequent damage to sensory, motor, and/or autonomic systems, resulting in permanent pain, numbness, and/or weakness of one or several areas of the body; allergic reactions; (i.e.: anaphylactic reaction); and/or death. Furthermore, the patient was informed of those risks and complications associated with the medications. These include, but are not limited to: allergic reactions (i.e.: anaphylactic or anaphylactoid reaction([name])); adrenal axis suppression; blood sugar elevation that in diabetics may result in ketoacidosis or comma; water retention that in patients with history of congestive heart failure may result in shortness of breath, pulmonary edema, and decompensation with resultant heart failure; weight gain; swelling or edema;  medication-induced neural toxicity; particulate matter embolism and blood vessel occlusion with resultant organ, and/or nervous system infarction; and/or aseptic necrosis of one or more joints. Finally, the patient was informed that Medicine is not an exact science; therefore, there is also the possibility of unforeseen or unpredictable risks and/or possible complications that may result in a catastrophic outcome. The patient indicated having understood very clearly. We have given the patient no guarantees and we have made no promises. Enough time was given to the patient to ask questions, all of which were answered to the patient'[name] satisfaction. Ms. Benney has indicated that she wanted to continue with the procedure. Attestation: I, the ordering provider, attest that I have discussed with the patient the benefits, risks, side-effects, alternatives, likelihood of achieving goals, and potential problems during recovery for the procedure that I have provided informed consent. Date  Time: 11/03/2021  9:09 AM  Pre-Procedure Preparation:  Monitoring: As per clinic protocol. Respiration, ETCO2, SpO2, BP, heart rate and rhythm monitor placed and checked for adequate function Safety Precautions: Patient was assessed for positional comfort and pressure points before starting the procedure. Time-out: I initiated and conducted the "Time-out" before starting the procedure, as per protocol. The patient was asked to participate by confirming the accuracy of the "Time Out" information. Verification of the correct person, site, and procedure were performed and confirmed by me, the nursing staff, and the patient. "Time-out" conducted as per Joint Commission'[name] Universal Protocol (UP.01.01.01). Time: 0959  Description of Procedure:          Laterality: Bilateral. The procedure was performed in identical fashion on both sides. Levels:  L2, L3, L4, L5, & S1 Medial Branch Level([name])  Safety Precautions: Aspiration looking for  blood return was conducted prior to all injections. At no point did we inject any substances, as a needle was being advanced. Before injecting, the patient was told to immediately notify me if she was experiencing any new onset of "ringing in the ears, or metallic taste in the mouth". No attempts were made at seeking any paresthesias.  Safe injection practices and needle disposal techniques used. Medications properly checked for expiration dates. SDV (single dose vial) medications used. After the completion of the procedure, all disposable equipment used was discarded in the proper designated medical waste containers. Local Anesthesia: Protocol guidelines were followed. The patient was positioned over the fluoroscopy table. The area was prepped in the usual manner. The time-out was completed. The target area was identified using fluoroscopy. A 12-in long, straight, sterile hemostat was used with fluoroscopic guidance to locate the targets for each level blocked. Once located, the skin was marked with an approved surgical skin marker. Once all sites were marked, the skin (epidermis, dermis, and hypodermis), as well as deeper tissues (fat, connective tissue and muscle) were infiltrated with a small amount of a short-acting local anesthetic, loaded on a 10cc syringe with a 25G, 1.5-in  Needle. An appropriate amount of time was allowed for local anesthetics to take effect before proceeding to the next step. Local Anesthetic: Lidocaine 2.0% The unused portion of the local anesthetic was discarded in the proper designated containers. Technical explanation of process:  L2 Medial Branch Nerve Block (MBB): The target area for the L2 medial branch is at the junction of the postero-lateral aspect of the superior articular process and the superior, posterior, and medial edge of the transverse process of L3. Under fluoroscopic guidance, a Quincke needle was inserted until contact was made with os over the superior  postero-lateral aspect of the pedicular shadow (target area). After negative aspiration for blood, 0.5 mL of the nerve block solution was injected without difficulty or complication. The needle was removed intact. L3 Medial Branch Nerve Block (MBB): The target area for the L3 medial branch is at the junction of the postero-lateral aspect of the superior articular process and the superior, posterior, and medial edge of the transverse process of L4. Under fluoroscopic guidance, a Quincke needle was inserted until contact was made with os over the superior postero-lateral aspect of the pedicular shadow (target area). After negative aspiration for blood, 0.5 mL of the nerve block solution was injected without difficulty or complication. The needle was removed intact. L4 Medial Branch Nerve Block (MBB): The target area for the L4 medial branch is at the junction of the postero-lateral aspect of the superior articular process and the superior, posterior, and medial edge of the transverse process of L5. Under fluoroscopic guidance, a Quincke needle was inserted until contact was made with os over the superior postero-lateral aspect of the pedicular shadow (target area). After negative aspiration for blood, 0.5 mL of the nerve block solution was injected without difficulty or complication. The needle was removed intact. L5 Medial Branch Nerve Block (MBB): The target area for the L5 medial branch is at the junction of the postero-lateral aspect of the superior articular process and the superior, posterior, and medial edge of the sacral ala. Under fluoroscopic guidance, a Quincke needle was inserted until contact was made with os over the superior postero-lateral aspect of the pedicular shadow (target area). After negative aspiration for blood, 0.5 mL of the nerve block solution was injected without difficulty or complication. The needle was removed intact. S1 Medial Branch Nerve Block (MBB): The target area for the S1  medial branch is at the posterior and inferior 6 o'clock position of the L5-S1 facet joint. Under fluoroscopic guidance, the Quincke needle inserted for the L5 MBB was redirected until contact was made with os over the inferior and postero aspect of the sacrum, at the 6 o'  clock position under the L5-S1 facet joint (Target area). After negative aspiration for blood, 0.5 mL of the nerve block solution was injected without difficulty or complication. The needle was removed intact.  Once the entire procedure was completed, the treated area was cleaned, making sure to leave some of the prepping solution back to take advantage of its long term bactericidal properties.      Illustration of the posterior view of the lumbar spine and the posterior neural structures. Laminae of L2 through S1 are labeled. DPRL5, dorsal primary ramus of L5; DPRS1, dorsal primary ramus of S1; DPR3, dorsal primary ramus of L3; FJ, facet (zygapophyseal) joint L3-L4; I, inferior articular process of L4; LB1, lateral branch of dorsal primary ramus of L1; IAB, inferior articular branches from L3 medial branch (supplies L4-L5 facet joint); IBP, intermediate branch plexus; MB3, medial branch of dorsal primary ramus of L3; NR3, third lumbar nerve root; [name], superior articular process of L5; SAB, superior articular branches from L4 (supplies L4-5 facet joint also); TP3, transverse process of L3.  Vitals:   11/03/21 1000 11/03/21 1006 11/03/21 1011 11/03/21 1021  BP: (!) 150/98 (!) 146/100 (!) 148/98 (!) 136/99  Pulse:      Resp: 11 10 11 12   Temp:      SpO2: 98% 98% 99% 99%  Weight:      Height:         Start Time: 0959 hrs. End Time: 1011 hrs.  Imaging Guidance (Spinal):          Type of Imaging Technique: Fluoroscopy Guidance (Spinal) Indication([name]): Assistance in needle guidance and placement for procedures requiring needle placement in or near specific anatomical locations not easily accessible without such  assistance. Exposure Time: Please see nurses notes. Contrast: None used. Fluoroscopic Guidance: I was personally present during the use of fluoroscopy. "Tunnel Vision Technique" used to obtain the best possible view of the target area. Parallax error corrected before commencing the procedure. "Direction-depth-direction" technique used to introduce the needle under continuous pulsed fluoroscopy. Once target was reached, antero-posterior, oblique, and lateral fluoroscopic projection used confirm needle placement in all planes. Images permanently stored in EMR. Interpretation: No contrast injected. I personally interpreted the imaging intraoperatively. Adequate needle placement confirmed in multiple planes. Permanent images saved into the patient'[name] record.  Antibiotic Prophylaxis:   Anti-infectives (From admission, onward)    None      Indication([name]): None identified  Post-operative Assessment:  Post-procedure Vital Signs:  Pulse/HCG Rate: 8793 Temp: 98.2 [name] (36.8 C) Resp: 12 BP: (!) 136/99 SpO2: 99 %  EBL: None  Complications: No immediate post-treatment complications observed by team, or reported by patient.  Note: The patient tolerated the entire procedure well. A repeat set of vitals were taken after the procedure and the patient was kept under observation following institutional policy, for this type of procedure. Post-procedural neurological assessment was performed, showing return to baseline, prior to discharge. The patient was provided with post-procedure discharge instructions, including a section on how to identify potential problems. Should any problems arise concerning this procedure, the patient was given instructions to immediately contact , at any time, without hesitation. In any case, we plan to contact the patient by telephone for a follow-up status report regarding this interventional procedure.  Comments:  No additional relevant information.  Plan of Care  Orders:   Orders Placed This Encounter  Procedures   LUMBAR FACET(MEDIAL BRANCH NERVE BLOCK) MBNB    Scheduling Instructions:     Procedure: Lumbar facet block (AKA.:  Lumbosacral medial branch nerve block)     Side: Bilateral     Level: L3-4 & L5-S1 Facets (L2, L3, L4, L5, & S1 Medial Branch Nerves)     Sedation: Patient'[name] choice.     Timeframe: Today    Order Specific Question:   Where will this procedure be performed?    Answer:   [facility]   [facility] C-ARM 1-60 MIN NO REPORT    Intraoperative interpretation by procedural physician at [facility].    Standing Status:   Standing    Number of Occurrences:   1    Order Specific Question:   Reason for exam:    Answer:   Assistance in needle guidance and placement for procedures requiring needle placement in or near specific anatomical locations not easily accessible without such assistance.   Informed Consent Details: Physician/Practitioner Attestation; Transcribe to consent form and obtain patient signature    Nursing Order: Transcribe to consent form and obtain patient signature. Note: Always confirm laterality of pain with Ms. Crumrine, before procedure.    Order Specific Question:   Physician/Practitioner attestation of informed consent for procedure/surgical case    Answer:   I, the physician/practitioner, attest that I have discussed with the patient the benefits, risks, side effects, alternatives, likelihood of achieving goals and potential problems during recovery for the procedure that I have provided informed consent.    Order Specific Question:   Procedure    Answer:   Lumbar Facet Block  under fluoroscopic guidance    Order Specific Question:   Physician/Practitioner performing the procedure    Answer:   Roylee Chaffin A. Laban EmperorNaveira MD    Order Specific Question:   Indication/Reason    Answer:   Low Back Pain, with our without leg pain, due to Facet Joint Arthralgia (Joint Pain) Spondylosis (Arthritis of the Spine),  without myelopathy or radiculopathy (Nerve Damage).   Care order/instruction: Please confirm that the patient has stopped the Eliquis (Apixaban) x 3 days prior to procedure or surgery.    Please confirm that the patient has stopped the Eliquis (Apixaban) x 3 days prior to procedure or surgery.    Standing Status:   Standing    Number of Occurrences:   1   Provide equipment / supplies at bedside    "Block Tray" (Disposable  single use) Needle type: SpinalSpinal Amount/quantity: 4 Size: Medium (5-inch) Gauge: 22G    Standing Status:   Standing    Number of Occurrences:   1    Order Specific Question:   Specify    Answer:   Block Tray   Bleeding precautions    Standing Status:   Standing    Number of Occurrences:   1   Chronic Opioid Analgesic:  Hydrocodone/APAP 5/325, 1 tab p.o. BID (10 mg/day of hydrocodone) (10 MME) (last filled on 05/02/2021) MME/day: 10 mg/day   Medications ordered for procedure: Meds ordered this encounter  Medications   lidocaine (XYLOCAINE) 2 % (with pres) injection 400 mg   pentafluoroprop-tetrafluoroeth (GEBAUERS) aerosol   lactated ringers infusion 1,000 mL   midazolam (VERSED) 5 MG/5ML injection 0.5-2 mg    Make sure Flumazenil is available in the pyxis when using this medication. If oversedation occurs, administer 0.2 mg IV over 15 sec. If after 45 sec no response, administer 0.2 mg again over 1 min; may repeat at 1 min intervals; not to exceed 4 doses (1 mg)   ropivacaine (PF) 2 mg/mL (0.2%) (NAROPIN) injection 18 mL  triamcinolone acetonide (KENALOG-40) injection 80 mg   Medications administered: We administered lidocaine, lactated ringers, midazolam, ropivacaine (PF) 2 mg/mL (0.2%), and triamcinolone acetonide.  See the medical record for exact dosing, route, and time of administration.  Follow-up plan:   Return in about 2 weeks (around 11/17/2021) for Proc-day (T,Th), (VV), (PPE).       Interventional Therapies  Risk  Complexity Considerations:    Estimated body mass index is 34.2 kg/m as calculated from the following:   Height as of this encounter: 5\' 1"  (1.549 m).   Weight as of this encounter: 181 lb (82.1 kg). ELIQUIS + ASA Anticoagulation (Stop: 3 days  Restart: 6 hours)   Planned  Pending:      Under consideration:   Pending results of CT and MRI  Diagnostic/therapeutic cervical ESI  Diagnostic bilateral lumbar facet MBB #2    Completed:   Diagnostic bilateral lumbar facet MBB x1 (05/31/2021) (100/100/50/50)    Therapeutic  Palliative (PRN) options:   None established     Recent Visits Date Type Provider Dept  10/24/21 Office Visit 10/26/21, MD [facility]  Showing recent visits within past 90 days and meeting all other requirements Today'[name] Visits Date Type Provider Dept  11/03/21 Procedure visit 01/01/22, MD [facility]  Showing today'[name] visits and meeting all other requirements Future Appointments Date Type Provider Dept  11/17/21 Appointment 11/19/21, MD [facility]  01/11/22 Appointment 03/13/22, MD [facility]  Showing future appointments within next 90 days and meeting all other requirements  Disposition: Discharge home  Discharge (Date  Time): 11/03/2021; 1025 hrs.   Primary Care Physician: 01/01/2022, FNP Location: [facility] Note by: [facility], MD Date: 11/03/2021; Time: 10:57 AM  Disclaimer:  Medicine is not an 01/01/2022. The only guarantee in medicine is that nothing is guaranteed. It is important to note that the decision to proceed with this intervention was based on the information collected from the patient. The Data and conclusions were drawn from the patient'[name] questionnaire, the interview, and the physical examination. Because the information was provided in large part by the patient, it cannot be guaranteed that it has not been purposely or unconsciously  manipulated. Every effort has been made to obtain as much relevant data as possible for this evaluation. It is important to note that the conclusions that lead to this procedure are derived in large part from the available data. Always take into account that the treatment will also be dependent on availability of resources and existing treatment guidelines, considered by other Pain Management Practitioners as being common knowledge and practice, at the time of the intervention. For Medico-Legal purposes, it is also important to point out that variation in procedural techniques and pharmacological choices are the acceptable norm. The indications, contraindications, technique, and results of the above procedure should only be interpreted and judged by a Board-Certified Interventional Pain Specialist with extensive familiarity and expertise in the same exact procedure and technique.

## 2021-11-03 NOTE — Progress Notes (Signed)
Nursing Pain Medication Assessment:  Safety precautions to be maintained throughout the outpatient stay will include: orient to surroundings, keep bed in low position, maintain call bell within reach at all times, provide assistance with transfer out of bed and ambulation.  Medication Inspection Compliance: Pill count conducted under aseptic conditions, in front of the patient. Neither the pills nor the bottle was removed from the patient's sight at any time. Once count was completed pills were immediately returned to the patient in their original bottle.  Medication: Hydrocodone/APAP Pill/Patch Count: 46/60 Pill/Patch Appearance: Markings consistent with prescribed medication Bottle Appearance: Standard pharmacy container. Clearly labeled. Filled Date: 02 / 01 / 2023 Last Medication intake:  Today

## 2021-11-04 ENCOUNTER — Telehealth: Payer: Self-pay | Admitting: *Deleted

## 2021-11-04 NOTE — Telephone Encounter (Signed)
Attempted to call for post procedure follow-up. Message left at daughter's voicemail.

## 2021-11-08 ENCOUNTER — Other Ambulatory Visit: Payer: Self-pay | Admitting: Internal Medicine

## 2021-11-11 ENCOUNTER — Ambulatory Visit (INDEPENDENT_AMBULATORY_CARE_PROVIDER_SITE_OTHER): Payer: Medicare Other | Admitting: *Deleted

## 2021-11-11 DIAGNOSIS — R296 Repeated falls: Secondary | ICD-10-CM

## 2021-11-11 DIAGNOSIS — R600 Localized edema: Secondary | ICD-10-CM

## 2021-11-11 DIAGNOSIS — I10 Essential (primary) hypertension: Secondary | ICD-10-CM

## 2021-11-11 NOTE — Patient Instructions (Addendum)
Visit Information  Thank you for taking time to visit with me today. Please don't hesitate to contact me if I can be of assistance to you before our next scheduled telephone appointment.  Following are the goals we discussed today:  (Take all medications as prescribed Attend all scheduled provider appointments Call provider office for new concerns or questions  Continue to monitor lower extremities for swelling  Our next appointment is by telephone on 3/24 at 1045  Please call the care guide team at [phone] if you need to cancel or reschedule your appointment.   If you are experiencing a Mental Health or Behavioral Health Crisis or need someone to talk to, please call the Suicide and Crisis Lifeline: 988 call the [place] National Suicide Prevention Lifeline: 681-139-3780 or TTY: 617 166 0913 TTY (364) 175-0443) to talk to a trained counselor call 1-800-273-TALK (toll free, 24 hour hotline) call 911   Patient verbalizes understanding of instructions and care plan provided today and agrees to view in MyChart. Active MyChart status confirmed with patient.    Rhae Lerner RN, MSN RN Care Management Coordinator [facility] [phone] [email or web address]
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
## 2021-11-14 ENCOUNTER — Other Ambulatory Visit: Payer: Self-pay | Admitting: Adult Health

## 2021-11-14 DIAGNOSIS — E039 Hypothyroidism, unspecified: Secondary | ICD-10-CM

## 2021-11-16 ENCOUNTER — Encounter: Payer: Self-pay | Admitting: Pain Medicine

## 2021-11-17 ENCOUNTER — Ambulatory Visit: Payer: Medicare Other | Attending: Pain Medicine | Admitting: Pain Medicine

## 2021-11-17 ENCOUNTER — Other Ambulatory Visit: Payer: Self-pay

## 2021-11-17 DIAGNOSIS — M47816 Spondylosis without myelopathy or radiculopathy, lumbar region: Secondary | ICD-10-CM

## 2021-11-17 DIAGNOSIS — M545 Low back pain, unspecified: Secondary | ICD-10-CM

## 2021-11-17 DIAGNOSIS — R937 Abnormal findings on diagnostic imaging of other parts of musculoskeletal system: Secondary | ICD-10-CM

## 2021-11-17 DIAGNOSIS — G8929 Other chronic pain: Secondary | ICD-10-CM

## 2021-11-17 NOTE — Progress Notes (Signed)
Unsuccessful attempt to contact patient for Virtual Visit (Pain Management Telehealth)   Patient provided contact information:  [phone] (home); [phone] (mobile); (Preferred) [phone] [email or web address]   Pre-screening:  Our staff was successful in contacting Ms. Marchant using the above provided information.   I unsuccessfully attempted to make contact with [name] Everett on 2 different occasions on 11/17/2021 via telephone. I was unable to complete the virtual encounter due to call going directly to voicemail. I was able to leave a message where I clearly identify myself as Oswaldo Done, MD and I left a message to call us back to reschedule the call.  The information below was obtained by the nursing staff during the precharting evaluation.  Post-procedure evaluation    Type: Lumbar Facet, Medial Branch Block(s) #2  Laterality: Bilateral  Level: L2, L3, L4, L5, & S1 Medial Branch Level(s). Injecting these levels blocks the L3-4 and L5-S1 lumbar facet joints. Imaging: Fluoroscopic guidance Anesthesia: Local anesthesia (1-2% Lidocaine) Anxiolysis: IV Versed  1.5 mg Sedation: None. DOS: 11/03/2021 Performed by: Oswaldo Done, MD  Primary Purpose: Diagnostic/Therapeutic Indications: Low back pain severe enough to impact quality of life or function. 1. Lumbar facet joint syndrome (Bilateral)   2. Spondylosis without myelopathy or radiculopathy, lumbosacral region   3. Lumbar facet arthropathy (Multilevel) (Bilateral)   4. Lumbosacral facet hypertrophy (Multilevel) (Bilateral)   5. DDD (degenerative disc disease), lumbosacral   6. Chronic low back pain (1ry area of Pain) (Bilateral) (R>L) w/o sciatica    Chronic anticoagulation (Eliquis)    NAS-11 Pain score:   Pre-procedure: 9 /10   Post-procedure: 4 /10      Effectiveness:  Initial hour after procedure: 100 %. Subsequent 4-6 hours post-procedure: 100 %. Analgesia past initial 6 hours: 90 %. Ongoing  improvement:  Analgesic:  90% ongoing  Pharmacotherapy Assessment  Analgesic: Hydrocodone/APAP 5/325, 1 tab p.o. BID (10 mg/day of hydrocodone) (10 MME) (last filled on 05/02/2021) MME/day: 10 mg/day   Follow-up plan:   Reschedule Visit.    Interventional Therapies  Risk   Complexity Considerations:   Estimated body mass index is 34.2 kg/m as calculated from the following:   Height as of this encounter: 5\' 1"  (1.549 m).   Weight as of this encounter: 181 lb (82.1 kg). ELIQUIS + ASA Anticoagulation (Stop: 3 days   Restart: 6 hours)   Planned   Pending:      Under consideration:   Pending results of CT and MRI  Diagnostic/therapeutic cervical ESI  Therapeutic bilateral lumbar facet RFA #1    Completed:   Diagnostic bilateral lumbar facet MBB #1 (05/31/2021) (100/100/50/50)  Diagnostic bilateral lumbar facet MBB #2 (11/03/2021) (100/100/90/90)  Diagnostic left cervical facet MBB #1 (06/28/2021) (25/25/0/0)    Therapeutic   Palliative (PRN) options:   None established    Recent Visits Date Type Provider Dept  11/03/21 Procedure visit 01/01/22, MD [facility]  10/24/21 Office Visit 10/26/21, MD [facility]  Showing recent visits within past 90 days and meeting all other requirements Today's Visits Date Type Provider Dept  11/17/21 Office Visit 11/19/21, MD [facility]  Showing today's visits and meeting all other requirements Future Appointments Date Type Provider Dept  01/11/22 Appointment 03/13/22, MD [facility]  Showing future appointments within next 90 days and meeting all other requirements   Note by: Delano Metz, MD Date: 11/17/2021; Time: 5:21 PM

## 2021-11-18 ENCOUNTER — Other Ambulatory Visit: Payer: Self-pay

## 2021-11-18 DIAGNOSIS — F419 Anxiety disorder, unspecified: Secondary | ICD-10-CM

## 2021-11-18 DIAGNOSIS — E039 Hypothyroidism, unspecified: Secondary | ICD-10-CM

## 2021-11-18 MED ORDER — LEVOTHYROXINE SODIUM 100 MCG PO TABS: 100.0000 ug | ORAL_TABLET | Freq: Every day | ORAL | 1 refills | Status: DC

## 2021-11-18 MED ORDER — CLONAZEPAM 0.5 MG PO TABS: ORAL_TABLET | ORAL | 0 refills | Status: DC

## 2021-11-23 ENCOUNTER — Telehealth: Payer: Self-pay | Admitting: Adult Health

## 2021-11-23 DIAGNOSIS — F419 Anxiety disorder, unspecified: Secondary | ICD-10-CM

## 2021-11-23 MED ORDER — CLONAZEPAM 0.5 MG PO TABS: ORAL_TABLET | ORAL | 0 refills | Status: DC

## 2021-11-23 NOTE — Telephone Encounter (Signed)
30 day supply of clonazepam sent t [facility]

## 2021-11-23 NOTE — Telephone Encounter (Signed)
Patient called about a refill on her clonazePAM (KLONOPIN) 0.5 MG tablet. It was never sent to her pharmacy.

## 2021-11-28 DIAGNOSIS — H2512 Age-related nuclear cataract, left eye: Secondary | ICD-10-CM | POA: Diagnosis not present

## 2021-11-29 DIAGNOSIS — H2511 Age-related nuclear cataract, right eye: Secondary | ICD-10-CM | POA: Diagnosis not present

## 2021-11-29 DIAGNOSIS — I10 Essential (primary) hypertension: Secondary | ICD-10-CM

## 2021-11-29 DIAGNOSIS — H2512 Age-related nuclear cataract, left eye: Secondary | ICD-10-CM | POA: Diagnosis not present

## 2021-11-29 DIAGNOSIS — H25012 Cortical age-related cataract, left eye: Secondary | ICD-10-CM | POA: Diagnosis not present

## 2021-12-07 ENCOUNTER — Other Ambulatory Visit: Payer: Self-pay

## 2021-12-07 ENCOUNTER — Ambulatory Visit: Payer: Medicare Other | Admitting: Internal Medicine

## 2021-12-07 ENCOUNTER — Encounter: Payer: Self-pay | Admitting: Internal Medicine

## 2021-12-07 VITALS — BP 160/100 | HR 96 | Ht 61.0 in | Wt 187.0 lb

## 2021-12-07 DIAGNOSIS — I25810 Atherosclerosis of coronary artery bypass graft(s) without angina pectoris: Secondary | ICD-10-CM | POA: Diagnosis not present

## 2021-12-07 DIAGNOSIS — M79605 Pain in left leg: Secondary | ICD-10-CM

## 2021-12-07 DIAGNOSIS — I4821 Permanent atrial fibrillation: Secondary | ICD-10-CM

## 2021-12-07 DIAGNOSIS — R600 Localized edema: Secondary | ICD-10-CM

## 2021-12-07 DIAGNOSIS — I10 Essential (primary) hypertension: Secondary | ICD-10-CM | POA: Diagnosis not present

## 2021-12-07 DIAGNOSIS — M79604 Pain in right leg: Secondary | ICD-10-CM

## 2021-12-07 DIAGNOSIS — I4819 Other persistent atrial fibrillation: Secondary | ICD-10-CM

## 2021-12-07 MED ORDER — HYDROCHLOROTHIAZIDE 25 MG PO TABS: 25.0000 mg | ORAL_TABLET | Freq: Every day | ORAL | 0 refills | Status: DC

## 2021-12-07 NOTE — Progress Notes (Signed)
Follow-up Outpatient Visit Date: 12/07/2021  Primary Care Provider: Doreen Beam, FNP [address] [facility] [address] [place] [address]  Chief Complaint: Leg pain  HPI:  Carmen [name] is a 81 y.o. female with history of coronary artery disease status post CABG (2005, LIMA-LAD, sequential SVG-D-OM, and SVG-PDA) and subsequent PCI due to failure of multiple grafts (Taxus stents to proximal LAD, mid LCx, and RCA), persistent atrial fibrillation, stroke, PAD, hypertension, hyperlipidemia, hypothyroidism, anemia, chronic pain syndrome, and depression/anxiety, who presents for follow-up of coronary artery disease and atrial fibrillation.  She was last seen in our office on 10/27/2021 by Christell Faith, PA, at which time she was doing well from a heart standpoint.  Mild bilateral pedal edema was noted.  Several mechanical falls were reported since her last visit in 03/2021.  Amlodipine was discontinued due to pedal edema and borderline low blood pressure following recent initiation of HCTZ.  Today, Carmen [name] reports that she feels fairly well though she complains about pain in her legs that has been present for several years.  She also notes that her hands and feet frequently feel cold.  She describes her leg pain as achiness, especially when she first wakes up in the morning or after she has walked.  She wears compression stockings regularly.  She has chronic swelling in her calves, worse on the left side after a traumatic injury 4 years ago.  She has stable mild exertional dyspnea but no orthopnea, palpitations, lightheadedness, or chest pain.  She has fallen on a few occasions, which she attributes to her legs not moving as fast as she would like.  She remains on apixaban.  Her home blood pressures have been somewhat labile per her report, though she cannot provide specific readings.  --------------------------------------------------------------------------------------------------  Cardiovascular History &  Procedures: Cardiovascular Problems: Coronary artery disease status post CABG and PCI Paroxysmal atrial fibrillation Peripheral vascular disease   Risk Factors: Known CAD and PAD, stroke, obesity, tobacco use, and age greater than 23   Cath/PCI: LHC/PCI (2006): Taxus DES to proximal LAD, mid LCx, and RCA x2.   CV Surgery: CABG (2005, LIMA to LAD, SVG to diagonal and OM, and SVG to PDA)   EP Procedures and Devices: None   Non-Invasive Evaluation(s): TTE (12/14/2020): Normal LV size with moderate LVH.  LVEF 55-60%.  Indeterminate diastolic function.  Normal RV size and function.  Normal PA pressure.  Mild left atrial enlargement.  Mild mitral and tricuspid regurgitation.  Normal CVP. 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  Recent CV Pertinent Labs: Lab Results  Component Value Date   CHOL 182 07/26/2021   HDL 50.10 07/26/2021   LDLDIRECT 99.0 07/26/2021   TRIG 292.0 (H) 07/26/2021   CHOLHDL 4 07/26/2021   INR 1.2 03/14/2021   K 4.5 10/27/2021   MG 1.6 10/18/2021   BUN 15 10/27/2021   CREATININE 0.93 10/27/2021    Past medical and surgical history were reviewed and updated in EPIC.  Current Meds  Medication Sig   apixaban (ELIQUIS) 5 MG TABS tablet Take 1 tablet (5 mg total) by mouth 2 (two) times daily.   aspirin EC 81 MG tablet Take 81 mg by mouth daily. Swallow whole.   atorvastatin (LIPITOR) 20 MG tablet TAKE 1 TABLET BY MOUTH EVERYDAY AT BEDTIME   calcium carbonate (OSCAL) 1500 (600 Ca) MG TABS tablet Take 1 tablet (1,500 mg total) by mouth 2 (two) times daily with a meal.   cetirizine (ZYRTEC) 10 MG tablet Take  10 mg by mouth daily.   CHOLECALCIFEROL PO Take 1 capsule by mouth daily.   clonazePAM (KLONOPIN) 0.5 MG tablet TAKE 1 TABLET BY MOUTH EVERY DAY AS NEEDED FOR ANXIETY   hydrochlorothiazide (MICROZIDE) 12.5 MG capsule Take 1 capsule (12.5 mg total) by mouth daily.   HYDROcodone-acetaminophen (NORCO/VICODIN) 5-325 MG tablet Take 1 tablet  by mouth 2 (two) times daily as needed for severe pain. Must last 30 days.   HYDROcodone-acetaminophen (NORCO/VICODIN) 5-325 MG tablet Take 1 tablet by mouth 2 (two) times daily as needed for severe pain. Must last 30 days.   [START ON 12/31/2021] HYDROcodone-acetaminophen (NORCO/VICODIN) 5-325 MG tablet Take 1 tablet by mouth 2 (two) times daily as needed for severe pain. Must last 30 days.   ketorolac (ACULAR) 0.5 % ophthalmic solution Place 1 drop into the left eye 4 (four) times daily.   levothyroxine (SYNTHROID) 100 MCG tablet Take 1 tablet (100 mcg total) by mouth daily before breakfast.   metoprolol succinate (TOPROL-XL) 100 MG 24 hr tablet Take 50 mg by mouth daily.   moxifloxacin (VIGAMOX) 0.5 % ophthalmic solution Place 1 drop into the left eye 4 (four) times daily.   prednisoLONE acetate (PRED FORTE) 1 % ophthalmic suspension Place 1 drop into the left eye 4 (four) times daily.   traZODone (DESYREL) 100 MG tablet Take 1 tablet (100 mg total) by mouth at bedtime as needed for sleep.    Allergies: Patient has no known allergies.  Social History   Tobacco Use   Smoking status: Former    Types: Cigarettes    Quit date: 05/2020    Years since quitting: 1.6   Smokeless tobacco: Never   Tobacco comments:    Smoked about 1 pack per month  Vaping Use   Vaping Use: Never used  Substance Use Topics   Alcohol use: Not Currently   Drug use: Never    Family History  Problem Relation Age of Onset   Breast cancer Mother    Bipolar disorder Daughter    Heart attack Father    Diabetes Mellitus II Neg Hx     Review of Systems: Carmen [name] notes that her urine seems a little darker over the last few weeks.  She denies dysuria or changes in urinary frequency.  Otherwise, a A 12-system review of systems was performed and was negative except as noted in the HPI.  --------------------------------------------------------------------------------------------------  Physical Exam: BP (!)  160/100 (BP Location: Right Arm, Patient Position: Sitting, Cuff Size: Large)    Pulse 96    Ht 5\' 1"  (1.549 m)    Wt 187 lb (84.8 kg)    SpO2 96%    BMI 35.33 kg/m   General:  NAD.  She is accompanied by her daughter. Neck: No JVD or HJR.   Lungs: Clear to auscultation bilaterally without wheezes or crackles. Heart: Irregularly irregular rhythm without murmurs, rubs, or gallops. Abdomen: Soft, nontender, nondistended. Extremities: Trace right and 1+ left pretibial edema with compression stockings on.  Pedal pulses difficult to appreciate with compression stockings in place.  EKG:  Atrial fibrillation.  No significant change from prior tracing on 10/27/2021.  Lab Results  Component Value Date   WBC 6.0 10/18/2021   HGB 12.2 10/18/2021   HCT 37.7 10/18/2021   MCV 92.0 10/18/2021   PLT 225.0 10/18/2021    Lab Results  Component Value Date   NA 138 10/27/2021   K 4.5 10/27/2021   CL 100 10/27/2021   CO2 22 10/27/2021  BUN 15 10/27/2021   CREATININE 0.93 10/27/2021   GLUCOSE 108 (H) 10/27/2021   ALT 14 10/18/2021    Lab Results  Component Value Date   CHOL 182 07/26/2021   HDL 50.10 07/26/2021   LDLDIRECT 99.0 07/26/2021   TRIG 292.0 (H) 07/26/2021   CHOLHDL 4 07/26/2021    --------------------------------------------------------------------------------------------------  ASSESSMENT AND PLAN: Coronary artery disease: No angina reported.  Continue current medications for secondary prevention.  Given history of multiple Taxus stent implantations, I favor continuation of apixaban and aspirin as long as significant bleeding or more frequent falls do not become a problem.  Continue atorvastatin and metoprolol succinate for secondary prevention.  Permanent atrial fibrillation: Ventricular rates reasonably well controlled.  Continue metoprolol succinate 50 mg daily for rate control.  We discussed risks of continued anticoagulation given report of frequent falls.  We will  continue with apixaban for now, though I have encouraged Carmen [name] to be particularly careful when walking.  If falls continue to be a problem, we may need to consider referral to EP to discuss risks/benefits of watchman implantation in the setting of a CHA2DS2-VASc score of at least 6.  Hypertension: Blood pressure moderately elevated today following discontinuation of amlodipine at our last visit.  We have agreed to increase HCTZ to 25 mg daily.  We will continue metoprolol 50 mg daily.  BMP planned in 1 month.  Leg edema: This has been chronic, managed with compression stockings.  Swelling did not improve noticeably with discontinuation of amlodipine.  Blood pressure is moderately elevated today.  We have agreed to increase hydrochlorothiazide to 25 mg daily with repeat BMP in 1 month.  Leg pain: This has been a chronic problem for years.  It can occur both at rest and with activity.  Vascular studies of her legs last year were normal.  We will see if her leg pain improves with escalation of diuresis, as above.  Follow-up: Return to clinic in 3 months.  Nelva Bush, MD 12/07/2021 3:07 PM

## 2021-12-07 NOTE — Patient Instructions (Signed)
Medication Instructions:  ? ?Your physician has recommended you make the following change in your medication:  ? ?INCREASE Hydrochlorothiazide (HCTZ) 25 mg daily  ? ?*If you need a refill on your cardiac medications before your next appointment, please call your pharmacy* ? ? ?Lab Work: ? ?Your physician recommends that you return for lab work in: 1 MONTH (BMET) ?  - This lab is not fasting ? ?Testing/Procedures: ? ?None ordered ? ? ?Follow-Up: ?At [facility], you and your health needs are our priority.  As part of our continuing mission to provide you with exceptional heart care, we have created designated Provider Care Teams.  These Care Teams include your primary Cardiologist (physician) and Advanced Practice Providers (APPs -  Physician Assistants and Nurse Practitioners) who all work together to provide you with the care you need, when you need it. ? ?We recommend signing up for the patient portal called "MyChart".  Sign up information is provided on this After Visit Summary.  MyChart is used to connect with patients for Virtual Visits (Telemedicine).  Patients are able to view lab/test results, encounter notes, upcoming appointments, etc.  Non-urgent messages can be sent to your provider as well.   ?To learn more about what you can do with MyChart, go to [email or web address].   ? ?Your next appointment:   ?3 month(s) ? ?The format for your next appointment:   ?In Person ? ?Provider:   ?You may see Yvonne Kendall, MD or one of the following Advanced Practice Providers on your designated Care Team:   ?Nicolasa Ducking, NP ?Eula Listen, PA-C ?Cadence Fransico Michael, PA-C ?

## 2021-12-08 ENCOUNTER — Encounter: Payer: Self-pay | Admitting: Internal Medicine

## 2021-12-08 ENCOUNTER — Other Ambulatory Visit: Payer: Self-pay | Admitting: Physician Assistant

## 2021-12-08 DIAGNOSIS — E785 Hyperlipidemia, unspecified: Secondary | ICD-10-CM

## 2021-12-08 DIAGNOSIS — I4821 Permanent atrial fibrillation: Secondary | ICD-10-CM | POA: Insufficient documentation

## 2021-12-08 DIAGNOSIS — I25810 Atherosclerosis of coronary artery bypass graft(s) without angina pectoris: Secondary | ICD-10-CM | POA: Insufficient documentation

## 2021-12-08 IMAGING — CR DG HAND COMPLETE 3+V*L*
3 series · 3 of 3 positions shown · non-contrast
Comparison: Right hand radiograph dated 03/14/2021.

CLINICAL DATA: 80-year-old female with chronic bilateral hand pain.

EXAM:
LEFT HAND - COMPLETE 3+ VIEW

[hand ap]
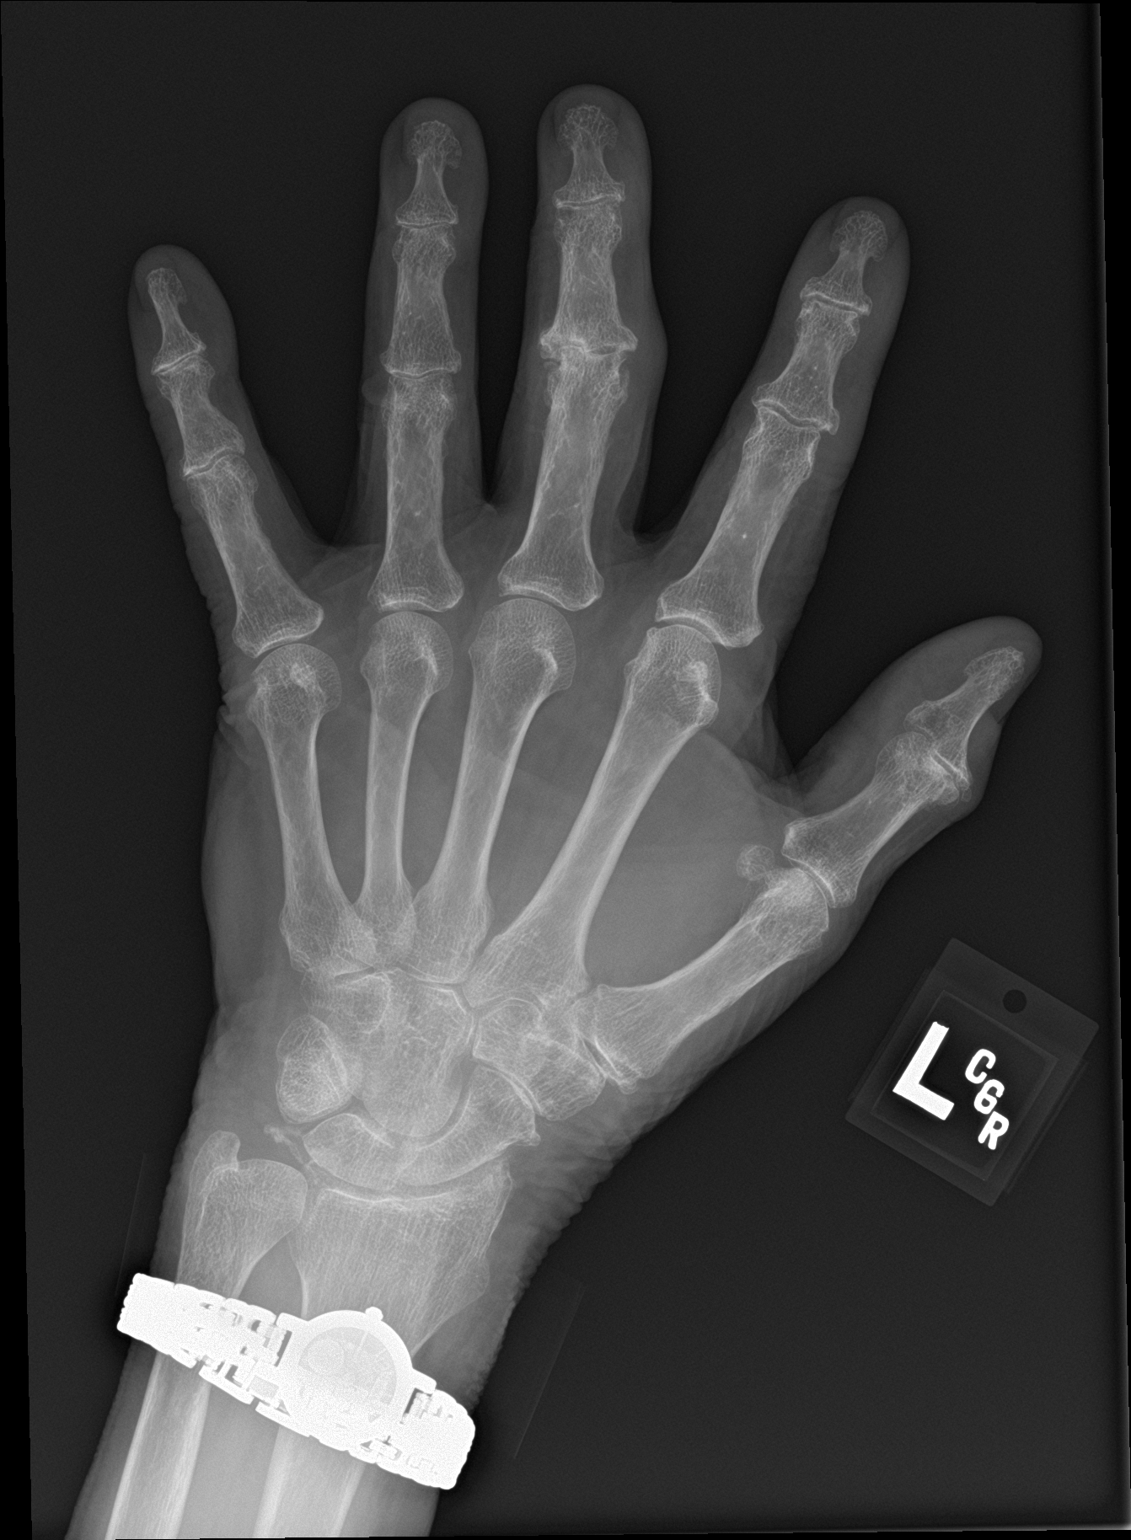

[hand obl]
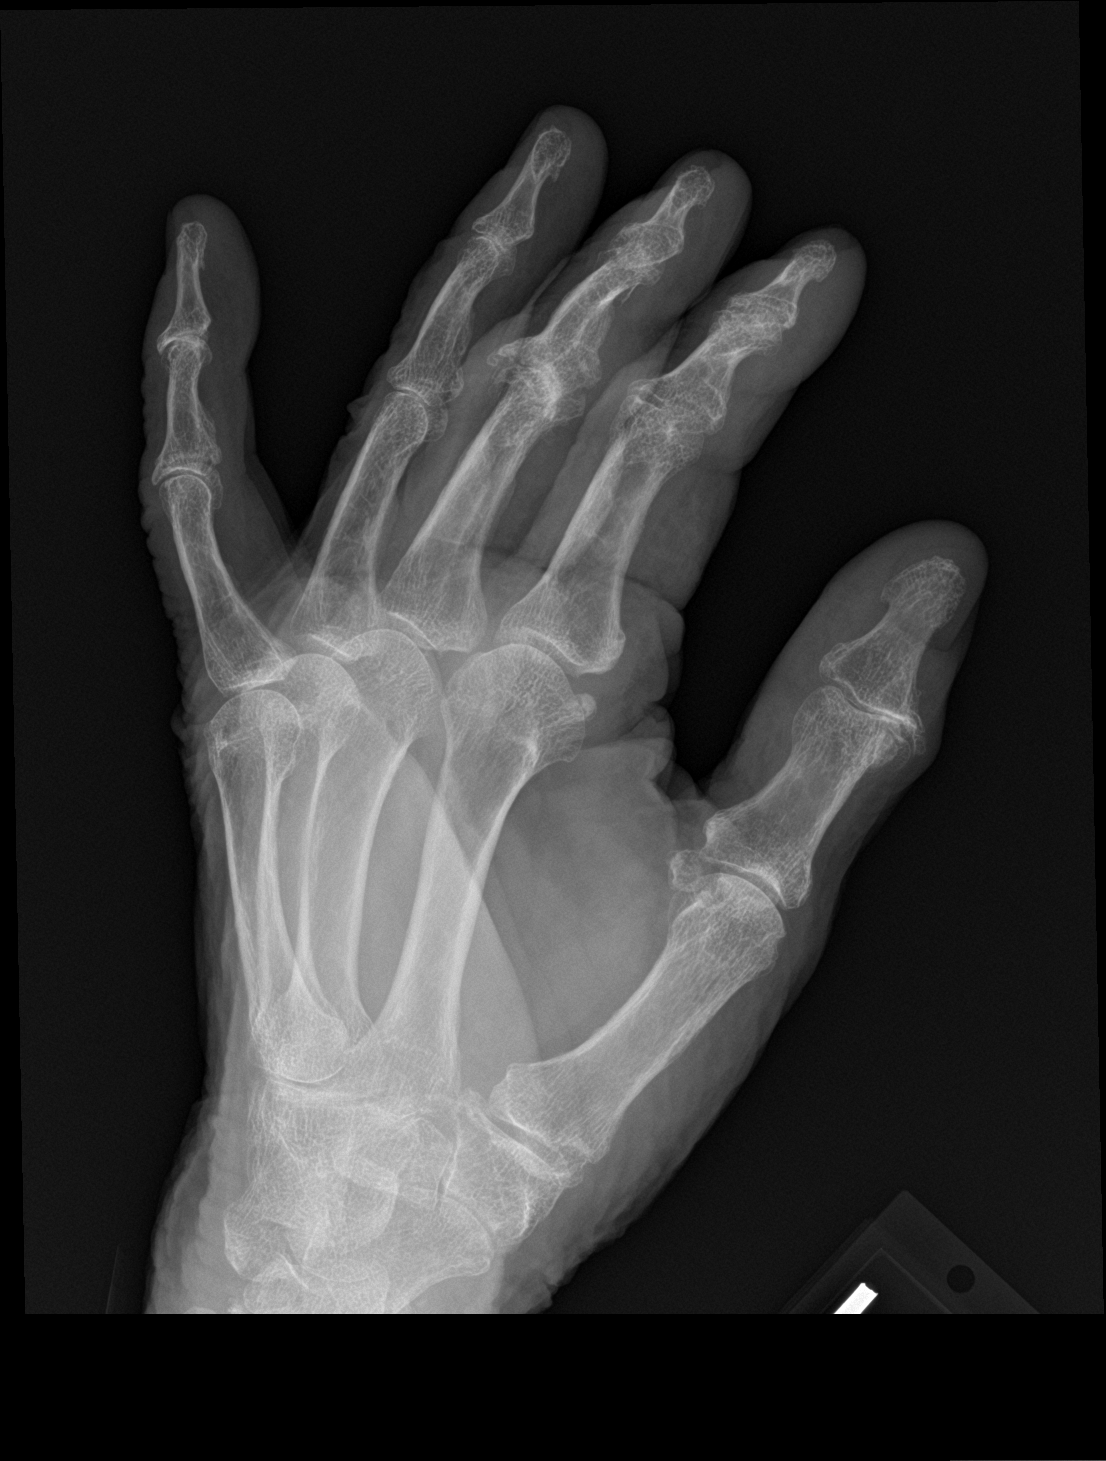

[hand lat]
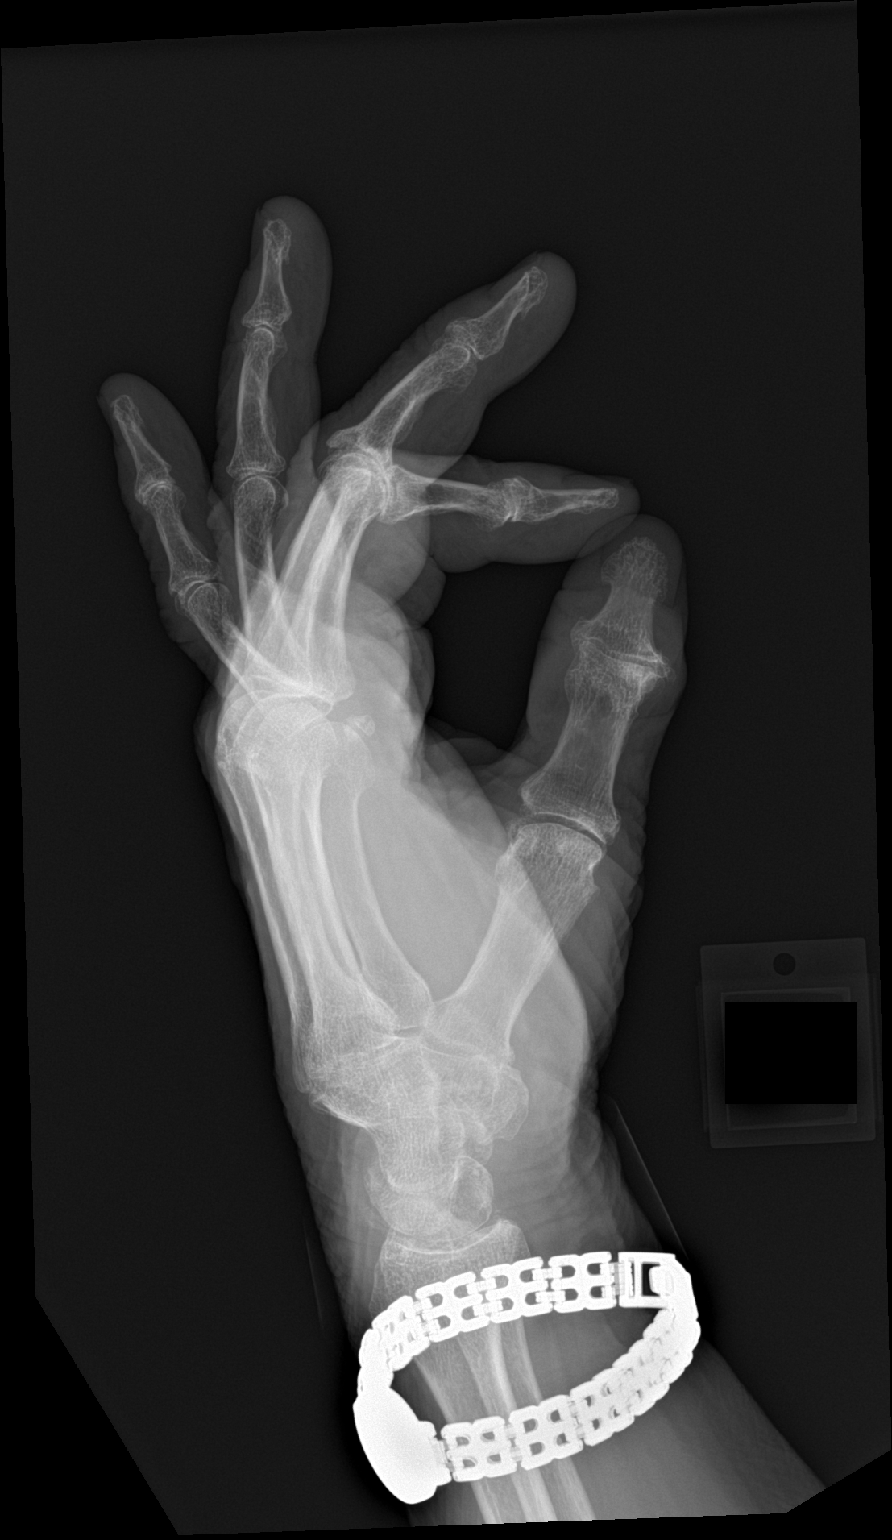

[3 of 3 positions shown; findings below may reference images not displayed]

FINDINGS: There is no acute fracture or dislocation. The bones are osteopenic.
There is arthritic changes of the interphalangeal joints most severe
at proximal interphalangeal joint of the third digit as well as
interphalangeal joint of the thumb. The soft tissues are
unremarkable.
IMPRESSION: 1. No acute fracture or dislocation.
2. Osteopenia and arthritic changes.

## 2021-12-08 IMAGING — CR DG HIP (WITH OR WITHOUT PELVIS) 2-3V*R*
3 series · 3 of 3 positions shown · non-contrast
Comparison: None.

CLINICAL DATA: Bilateral hip pain

EXAM:
DG HIP (WITH OR WITHOUT PELVIS) 2-3V RIGHT

[pelvis ap]
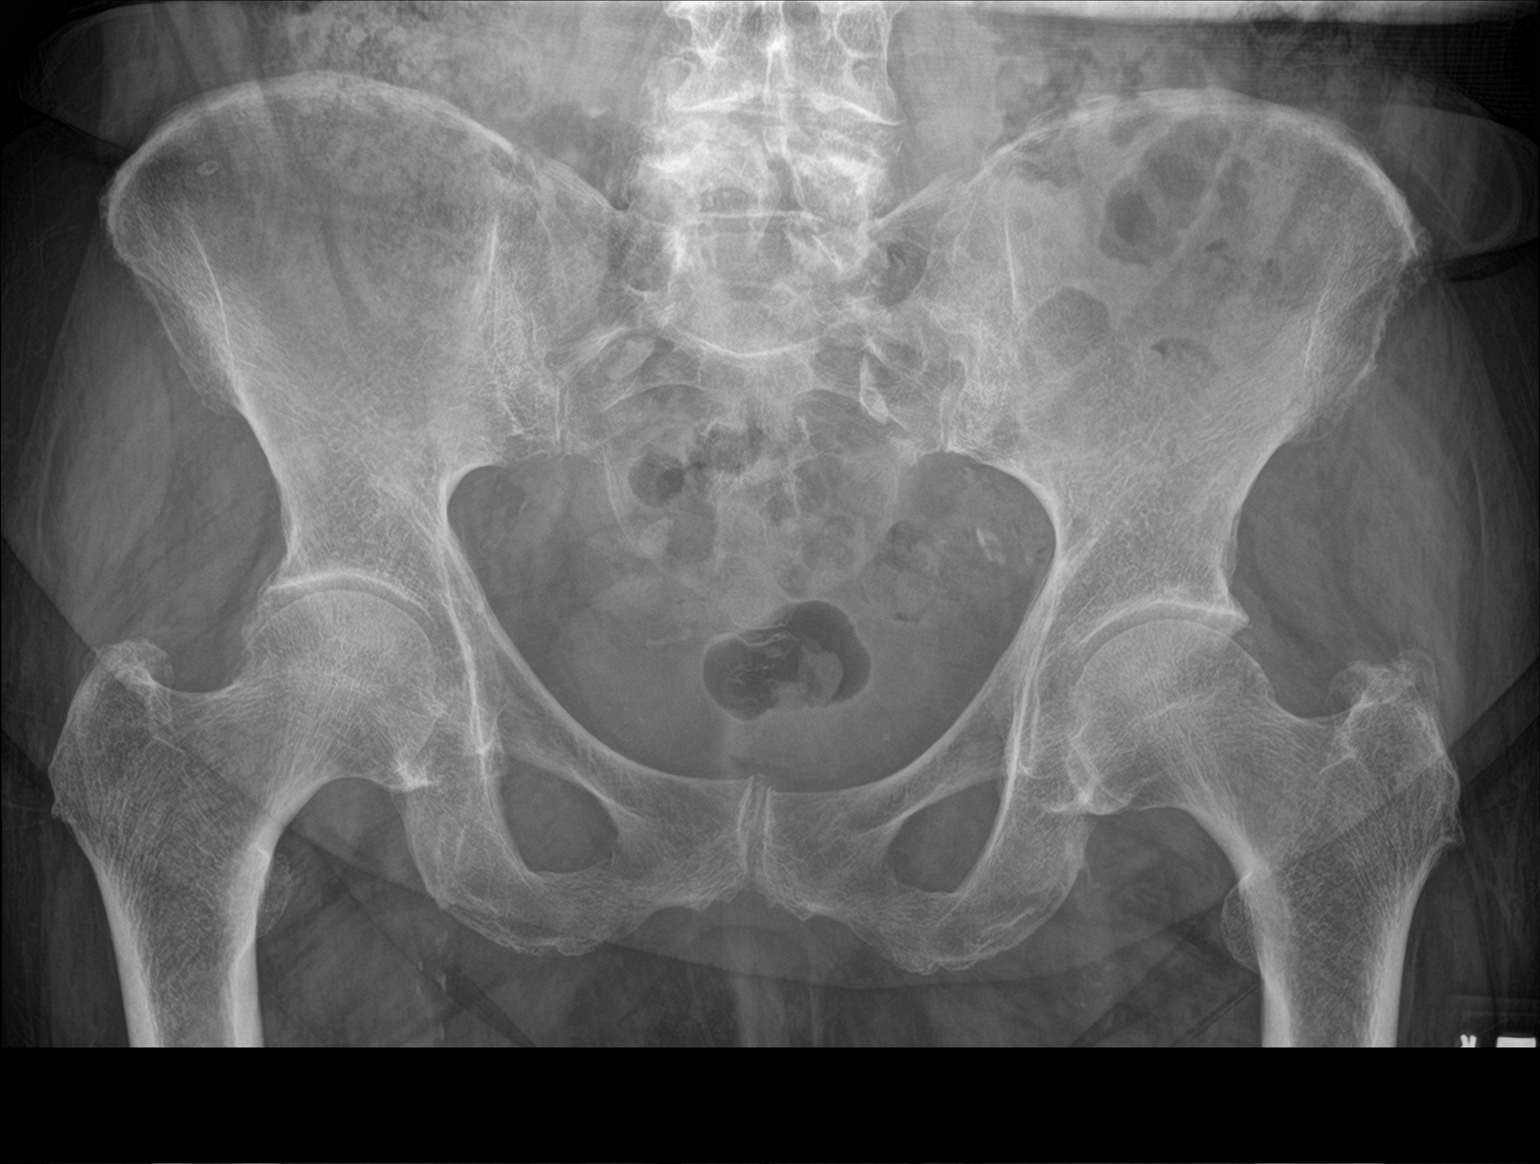

[hip ap]
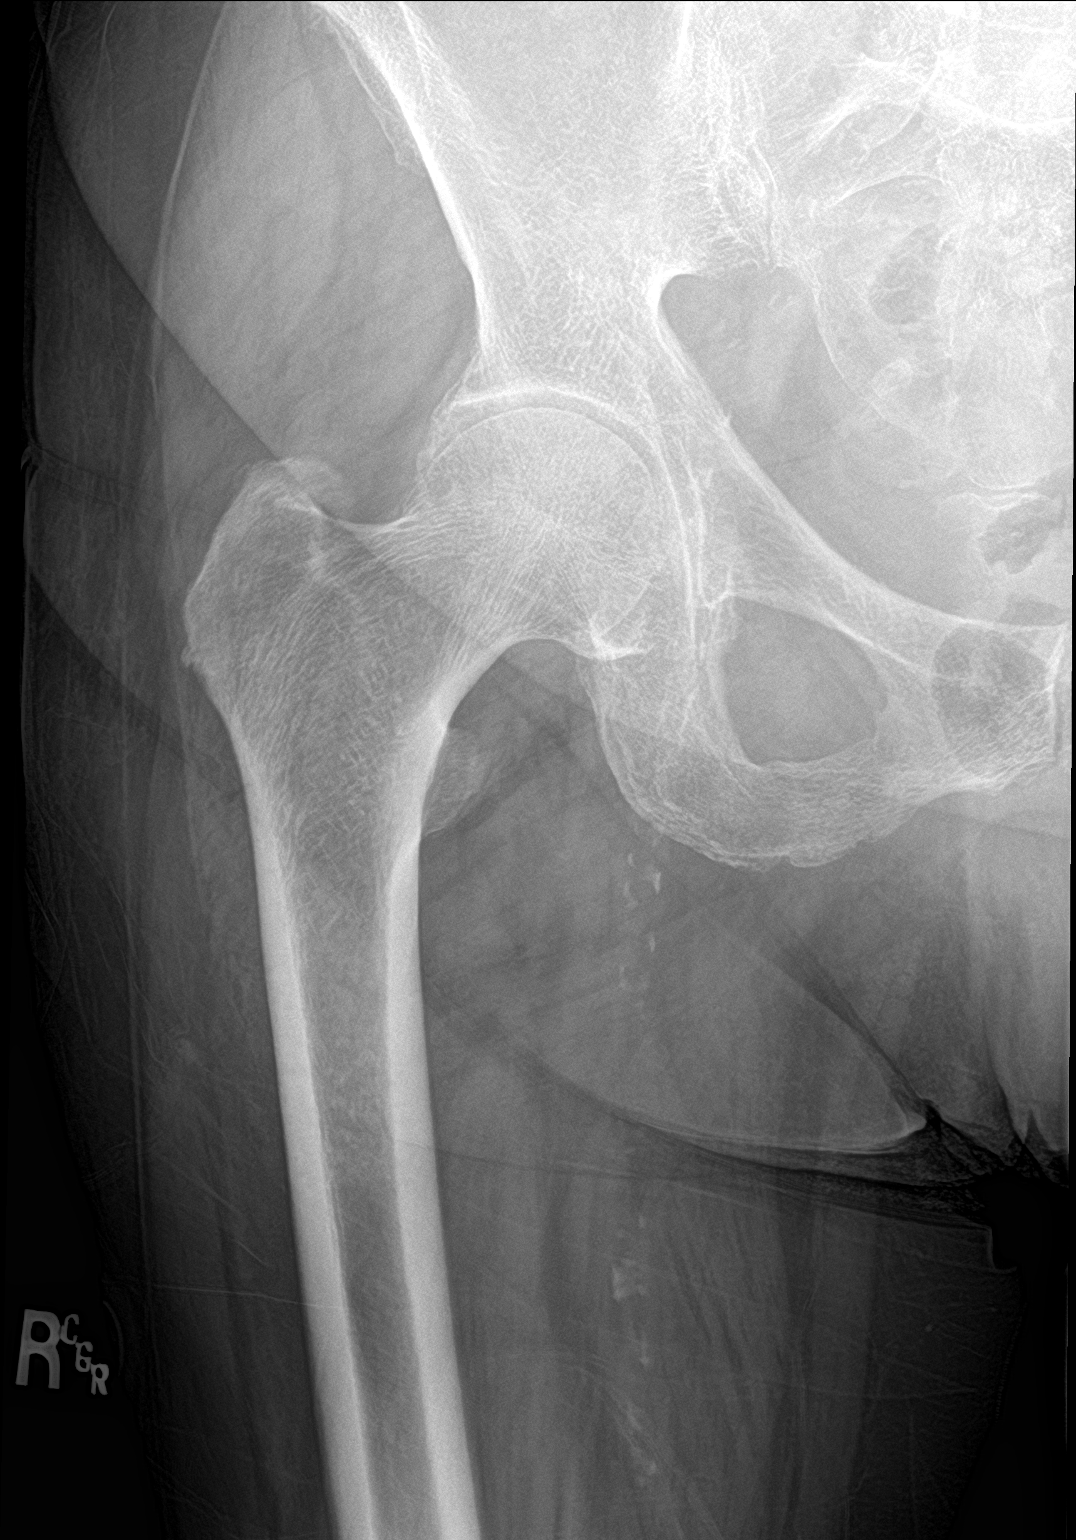

[hip lat]
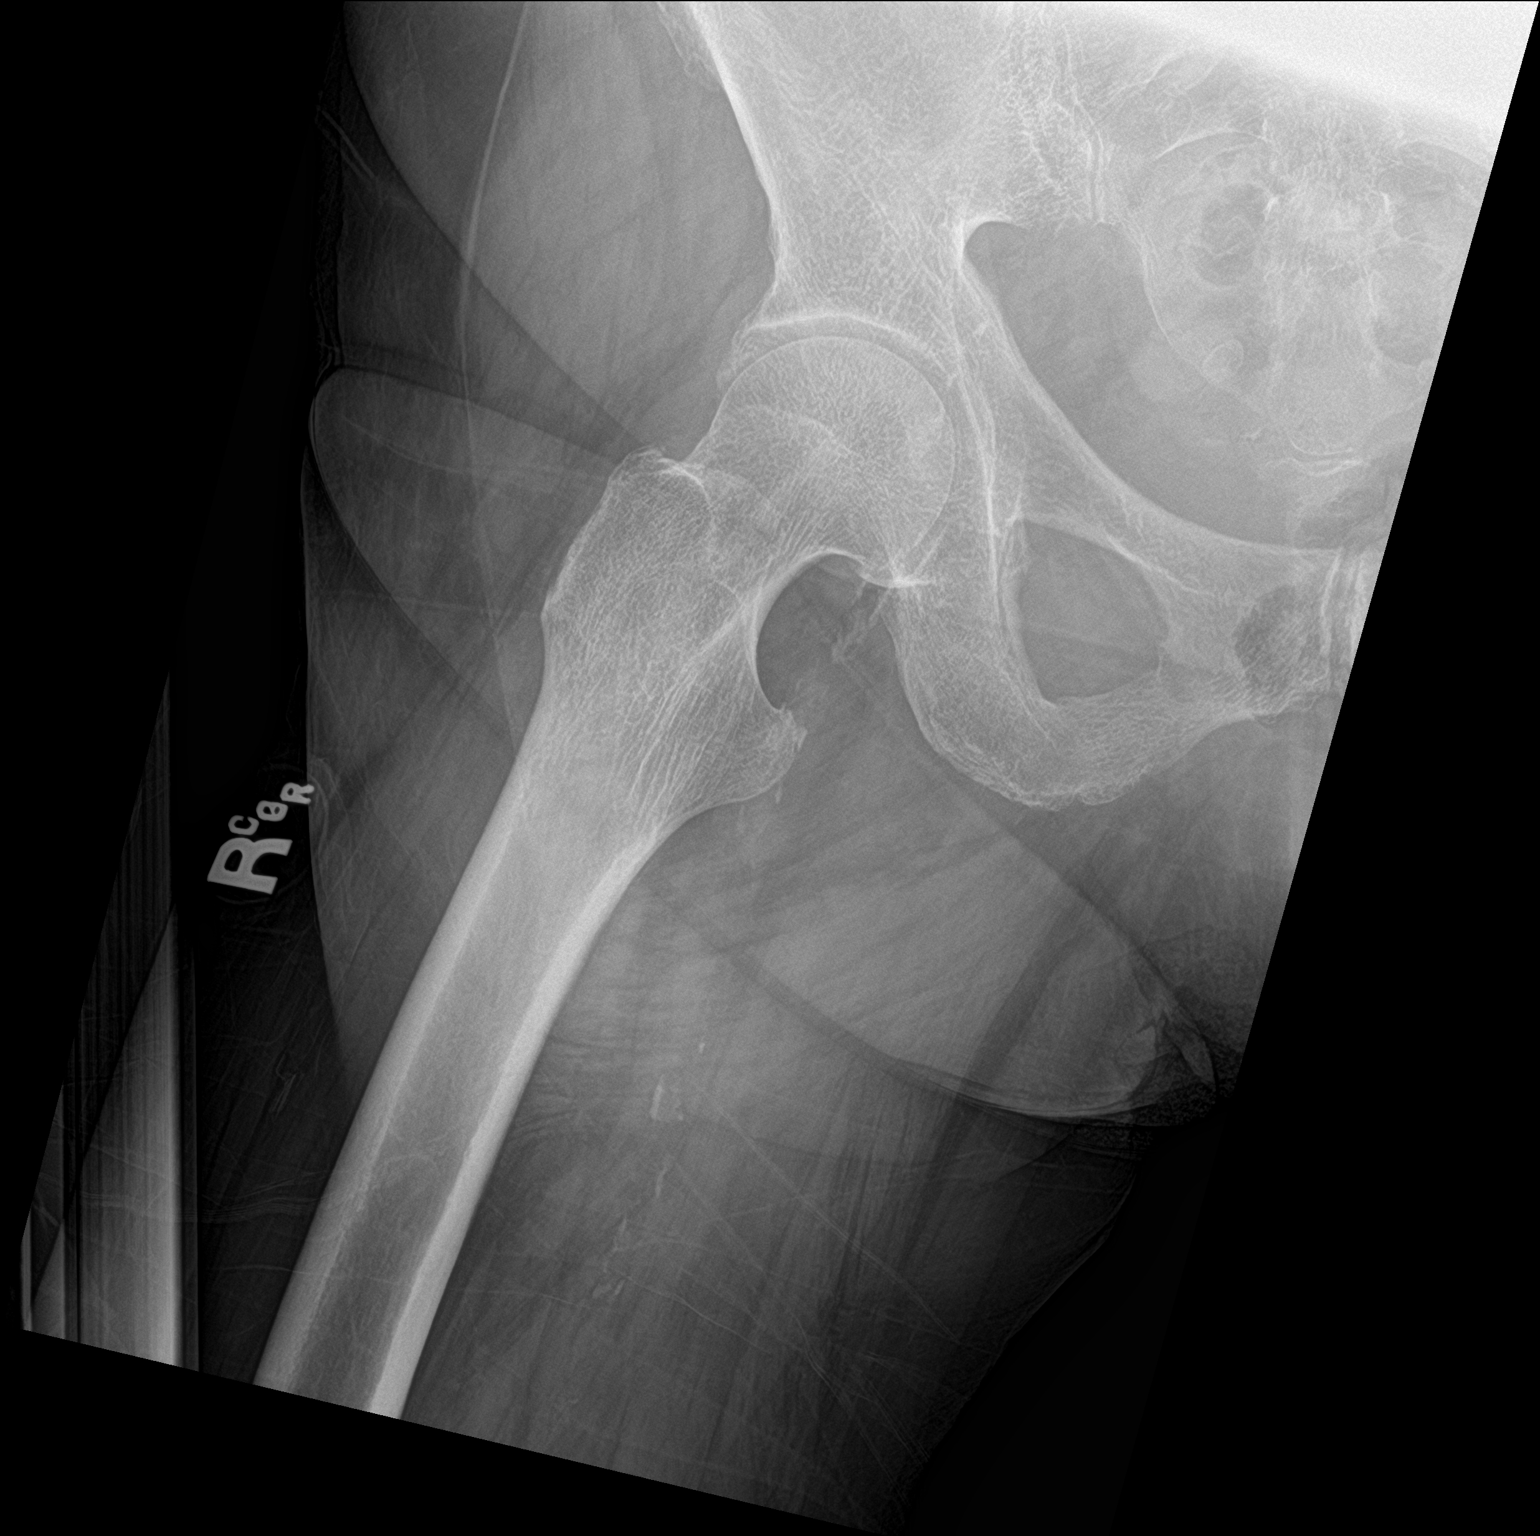

[3 of 3 positions shown; findings below may reference images not displayed]

FINDINGS: There is no evidence of hip fracture or dislocation. There is no
evidence of arthropathy or other focal bone abnormality.
IMPRESSION: Negative.

## 2021-12-12 ENCOUNTER — Telehealth: Payer: Self-pay | Admitting: Pain Medicine

## 2021-12-13 NOTE — Telephone Encounter (Signed)
Patients daughter called back and stated that her mother was hurting bad in her lower back and going down her right leg. States she is unable to walk. Do you have any suggestions? She had a procedure on Feb 2 and was doing well. Can she have another procedure? Will be glad to call patient back. ?

## 2021-12-13 NOTE — Telephone Encounter (Signed)
Returned call. No answer. LVM. 

## 2021-12-19 DIAGNOSIS — Z961 Presence of intraocular lens: Secondary | ICD-10-CM | POA: Diagnosis not present

## 2021-12-19 DIAGNOSIS — H2511 Age-related nuclear cataract, right eye: Secondary | ICD-10-CM | POA: Diagnosis not present

## 2021-12-23 ENCOUNTER — Telehealth: Payer: Self-pay | Admitting: *Deleted

## 2021-12-23 ENCOUNTER — Telehealth: Payer: Self-pay | Admitting: Adult Health

## 2021-12-23 ENCOUNTER — Telehealth: Payer: Medicare Other

## 2021-12-23 NOTE — Telephone Encounter (Signed)
?  Care Management  ? ?Follow Up Note ? ? ?12/23/2021 ?Name: Carmen [name] MRN: [id number] DOB: Aug 05, 1941 ? ? ?Referred by: Doreen Beam, FNP ?Reason for referral : Chronic Care Management (FALL, CHF FU) ? ? ?An unsuccessful telephone outreach was attempted today. The patient was referred to the case management team for assistance with care management and care coordination.  ? ?Follow Up Plan: RNCM will seek assistance from Care Guides in rescheduling appointment within the next 30 days. ? ?Hubert Azure RN, MSN ?RN Care Management Coordinator ?[place] ?[phone] ?[email or web address] ? ?

## 2021-12-23 NOTE — Telephone Encounter (Signed)
Pt daughter called in requesting for refill on medication (clonazePAM (KLONOPIN) 0.5 MG tabletclonazePAM (KLONOPIN) 0.5 MG tablet)... Pt requesting callback  ?

## 2022-01-01 ENCOUNTER — Other Ambulatory Visit: Payer: Self-pay | Admitting: Internal Medicine

## 2022-01-01 DIAGNOSIS — F419 Anxiety disorder, unspecified: Secondary | ICD-10-CM

## 2022-01-02 ENCOUNTER — Telehealth: Payer: Self-pay | Admitting: Pain Medicine

## 2022-01-02 MED ORDER — CLONAZEPAM 0.5 MG PO TABS: ORAL_TABLET | ORAL | 0 refills | Status: DC

## 2022-01-02 MED ORDER — HYDROCHLOROTHIAZIDE 25 MG PO TABS: 25.0000 mg | ORAL_TABLET | Freq: Every day | ORAL | 0 refills | Status: DC

## 2022-01-02 NOTE — Telephone Encounter (Signed)
Error

## 2022-01-02 NOTE — Telephone Encounter (Signed)
Called [facility], the prescription for Hydrocodone is there. They will fill, patient notified. ?

## 2022-01-07 ENCOUNTER — Other Ambulatory Visit: Payer: Self-pay | Admitting: Physician Assistant

## 2022-01-09 NOTE — Telephone Encounter (Signed)
Prescription refill request for Eliquis received. ?Indication: Atrial Fib ?Last office visit: 12/07/21  C End MD ?Scr: 0.93 on 10/27/21 ?Age: 81 ?Weight: 84.8kg ? ?Based on above findings Eliquis 5mg  twice daily is the appropriate dose.  Refill approved. ? ?

## 2022-01-09 NOTE — Telephone Encounter (Signed)
Refill Request.  
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
## 2022-01-11 ENCOUNTER — Other Ambulatory Visit: Payer: Self-pay

## 2022-01-11 ENCOUNTER — Ambulatory Visit: Payer: Medicare Other | Attending: Pain Medicine | Admitting: Pain Medicine

## 2022-01-11 ENCOUNTER — Other Ambulatory Visit (INDEPENDENT_AMBULATORY_CARE_PROVIDER_SITE_OTHER): Payer: Medicare Other

## 2022-01-11 ENCOUNTER — Encounter: Payer: Self-pay | Admitting: Pain Medicine

## 2022-01-11 VITALS — BP 176/101 | HR 84 | Temp 97.5°F | Resp 16 | Ht 61.0 in | Wt 187.0 lb

## 2022-01-11 DIAGNOSIS — I4821 Permanent atrial fibrillation: Secondary | ICD-10-CM | POA: Diagnosis not present

## 2022-01-11 DIAGNOSIS — G894 Chronic pain syndrome: Secondary | ICD-10-CM | POA: Insufficient documentation

## 2022-01-11 DIAGNOSIS — Z79891 Long term (current) use of opiate analgesic: Secondary | ICD-10-CM | POA: Diagnosis not present

## 2022-01-11 DIAGNOSIS — I10 Essential (primary) hypertension: Secondary | ICD-10-CM | POA: Diagnosis not present

## 2022-01-11 DIAGNOSIS — M545 Low back pain, unspecified: Secondary | ICD-10-CM | POA: Diagnosis not present

## 2022-01-11 DIAGNOSIS — M47816 Spondylosis without myelopathy or radiculopathy, lumbar region: Secondary | ICD-10-CM | POA: Diagnosis not present

## 2022-01-11 DIAGNOSIS — M79605 Pain in left leg: Secondary | ICD-10-CM | POA: Insufficient documentation

## 2022-01-11 DIAGNOSIS — M79604 Pain in right leg: Secondary | ICD-10-CM | POA: Insufficient documentation

## 2022-01-11 DIAGNOSIS — Z7901 Long term (current) use of anticoagulants: Secondary | ICD-10-CM | POA: Diagnosis not present

## 2022-01-11 DIAGNOSIS — I25810 Atherosclerosis of coronary artery bypass graft(s) without angina pectoris: Secondary | ICD-10-CM | POA: Diagnosis not present

## 2022-01-11 DIAGNOSIS — G8929 Other chronic pain: Secondary | ICD-10-CM | POA: Insufficient documentation

## 2022-01-11 DIAGNOSIS — M542 Cervicalgia: Secondary | ICD-10-CM | POA: Diagnosis not present

## 2022-01-11 DIAGNOSIS — Z79899 Other long term (current) drug therapy: Secondary | ICD-10-CM | POA: Insufficient documentation

## 2022-01-11 MED ORDER — HYDROCODONE-ACETAMINOPHEN 5-325 MG PO TABS: 1.0000 | ORAL_TABLET | Freq: Two times a day (BID) | ORAL | 0 refills | Status: DC | PRN

## 2022-01-11 NOTE — Progress Notes (Signed)
Nursing Pain Medication Assessment:  ?Safety precautions to be maintained throughout the outpatient stay will include: orient to surroundings, keep bed in low position, maintain call bell within reach at all times, provide assistance with transfer out of bed and ambulation.  ?Medication Inspection Compliance: Pill count conducted under aseptic conditions, in front of the patient. Neither the pills nor the bottle was removed from the patient's sight at any time. Once count was completed pills were immediately returned to the patient in their original bottle. ? ?Medication: Hydrocodone/APAP ?Pill/Patch Count:  34 of 60 pills remain ?Pill/Patch Appearance: Markings consistent with prescribed medication ?Bottle Appearance: Standard pharmacy container. Clearly labeled. ?Filled Date: 04 / 03 / 2023 ?Last Medication intake:  Today ?

## 2022-01-11 NOTE — Patient Instructions (Signed)
____________________________________________________________________________________________ ? ?Muscle Spasms & Cramps ? ?Cause:  ?The most common cause of muscle spasms and cramps is vitamin and/or electrolyte (calcium, potassium, sodium, etc.) deficiencies. ? ?Possible triggers: ?Sweating - causes loss of electrolytes thru the skin. ?Steroids - causes loss of electrolytes thru the urine. ? ?Treatment: ?Gatorade (or any other electrolyte-replenishing drink) - Take 1, 8 oz glass with each meal (3 times a day). ?OTC (over-the-counter) Magnesium 400 to 500 mg - Take 1 tablet twice a day (one with breakfast and one before bedtime). If you have kidney problems, talk to your primary care physician before taking any Magnesium. ?Tonic Water with quinine - Take 1, 8 oz glass before bedtime.  ? ?____________________________________________________________________________________________ ? ____________________________________________________________________________________________ ? ?Blood Thinners ? ?IMPORTANT NOTICE:  ?If you take any of these, make sure to notify the nursing staff.  ?Failure to do so may result in injury. ? ?Recommended time intervals to stop and restart blood-thinners, before & after invasive procedures  ?Generic Name Brand Name Pre-procedure. ?Stop this long before procedure. Post-procedure. ?Minimum waiting period before restarting.  ?Abciximab Reopro 15 days 2 hrs  ?Alteplase Activase 10 days 10 days  ?Anagrelide Agrylin    ?Apixaban Eliquis 3 days 6 hrs  ?Cilostazol Pletal 3 days 5 hrs  ?Clopidogrel Plavix 7-10 days 2 hrs  ?Dabigatran Pradaxa 5 days 6 hrs  ?Dalteparin Fragmin 24 hours 4 hrs  ?Dipyridamole Aggrenox 11days 2 hrs  ?Edoxaban Lixiana; Savaysa 3 days 2 hrs  ?Enoxaparin  Lovenox 24 hours 4 hrs  ?Eptifibatide Integrillin 8 hours 2 hrs  ?Fondaparinux  Arixtra 72 hours 12 hrs  ?Hydroxychloroquine Plaquenil 11 days   ?Prasugrel Effient 7-10 days 6 hrs  ?Reteplase Retavase 10 days 10 days   ?Rivaroxaban Xarelto 3 days 6 hrs  ?Ticagrelor Brilinta 5-7 days 6 hrs  ?Ticlopidine Ticlid 10-14 days 2 hrs  ?Tinzaparin Innohep 24 hours 4 hrs  ?Tirofiban Aggrastat 8 hours 2 hrs  ?Warfarin Coumadin 5 days 2 hrs  ? ?Other medications with blood-thinning effects  ?Product indications Generic (Brand) names Note  ?Cholesterol Lipitor Stop 4 days before procedure  ?Blood thinner (injectable) Heparin (LMW or LMWH Heparin) Stop 24 hours before procedure  ?Cancer Ibrutinib (Imbruvica) Stop 7 days before procedure  ?Malaria/Rheumatoid Hydroxychloroquine (Plaquenil) Stop 11 days before procedure  ?Thrombolytics  10 days before or after procedures  ? ?Over-the-counter (OTC) Products with blood-thinning effects  ?Product Common names Stop Time  ?Aspirin > 325 mg Goody Powders, Excedrin, etc. 11 days  ?Aspirin ? 81 mg  7 days  ?Fish oil  4 days  ?Garlic supplements  7 days  ?Ginkgo biloba  36 hours  ?Ginseng  24 hours  ?NSAIDs Ibuprofen, Naprosyn, etc. 3 days  ?Vitamin E  4 days  ? ?____________________________________________________________________________________________  ? ?______________________________________________________________________ ? ?Preparing for Procedure with Sedation ? ?NOTICE: Due to recent regulatory changes, starting on May 02, 2021, procedures requiring intravenous (IV) sedation will no longer be performed at the [facility].  These types of procedures are required to be performed at [facility] ambulatory surgery facility.  We are very sorry for the inconvenience. ? ?Procedure appointments are limited to planned procedures: ?No Prescription Refills. ?No disability issues will be discussed. ?No medication changes will be discussed. ? ?Instructions: ?Oral Intake: Do not eat or drink anything for at least 8 hours prior to your procedure. (Exception: Blood Pressure Medication. See below.) ?Transportation: A driver is required. You may not drive yourself after the procedure. ?Blood Pressure  Medicine: Do not forget to take  your blood pressure medicine with a sip of water the morning of the procedure. If your Diastolic (lower reading) is above 100 mmHg, elective cases will be cancelled/rescheduled. ?Blood thinners: These will need to be stopped for procedures. Notify our staff if you are taking any blood thinners. Depending on which one you take, there will be specific instructions on how and when to stop it. ?Diabetics on insulin: Notify the staff so that you can be scheduled 1st case in the morning. If your diabetes requires high dose insulin, take only ? of your normal insulin dose the morning of the procedure and notify the staff that you have done so. ?Preventing infections: Shower with an antibacterial soap the morning of your procedure. ?Build-up your immune system: Take 1000 mg of Vitamin C with every meal (3 times a day) the day prior to your procedure. ?Antibiotics: Inform the staff if you have a condition or reason that requires you to take antibiotics before dental procedures. ?Pregnancy: If you are pregnant, call and cancel the procedure. ?Sickness: If you have a cold, fever, or any active infections, call and cancel the procedure. ?Arrival: You must be in the facility at least 30 minutes prior to your scheduled procedure. ?Children: Do not bring children with you. ?Dress appropriately: Bring dark clothing that you would not mind if they get stained. ?Valuables: Do not bring any jewelry or valuables. ? ?Reasons to call and reschedule or cancel your procedure: (Following these recommendations will minimize the risk of a serious complication.) ?Surgeries: Avoid having procedures within 2 weeks of any surgery. (Avoid for 2 weeks before or after any surgery). ?Flu Shots: Avoid having procedures within 2 weeks of a flu shots. (Avoid for 2 weeks before or after immunizations). ?Barium: Avoid having a procedure within 7-10 days after having had a radiological study involving the use of radiological  contrast. (Myelograms, Barium swallow or enema study). ?Heart attacks: Avoid any elective procedures or surgeries for the initial 6 months after a "Myocardial Infarction" (Heart Attack). ?Blood thinners: It is imperative that you stop these medications before procedures. Let us know if you if you take any blood thinner.  ?Infection: Avoid procedures during or within two weeks of an infection (including chest colds or gastrointestinal problems). Symptoms associated with infections include: Localized redness, fever, chills, night sweats or profuse sweating, burning sensation when voiding, cough, congestion, stuffiness, runny nose, sore throat, diarrhea, nausea, vomiting, cold or Flu symptoms, recent or current infections. It is specially important if the infection is over the area that we intend to treat. ?Heart and lung problems: Symptoms that may suggest an active cardiopulmonary problem include: cough, chest pain, breathing difficulties or shortness of breath, dizziness, ankle swelling, uncontrolled high or unusually low blood pressure, and/or palpitations. If you are experiencing any of these symptoms, cancel your procedure and contact your primary care physician for an evaluation. ? ?Remember:  ?Regular Business hours are:  ?Monday to Thursday 8:00 AM to 4:00 PM ? ?Provider's Schedule: ?Delano Metz, MD:  ?Procedure days: Tuesday and Thursday 7:30 AM to 4:00 PM ? ?Edward Jolly, MD:  ?Procedure days: Monday and Wednesday 7:30 AM to 4:00 PM ?______________________________________________________________________ ? ____________________________________________________________________________________________ ? ?General Risks and Possible Complications ? ?Patient Responsibilities: It is important that you read this as it is part of your informed consent. It is our duty to inform you of the risks and possible complications associated with treatments offered to you. It is your responsibility as a patient to read this  and to ask questions  about anything that is not clear or that you believe was not covered in this document. ? ?Patient?s Rights: You have the right to refuse treatment. You also have the right to change your mind,

## 2022-01-12 LAB — BASIC METABOLIC PANEL
BUN/Creatinine Ratio: 22 (ref 12–28)
BUN: 18 mg/dL (ref 8–27)
CO2: 24 mmol/L (ref 20–29)
Calcium: 9.6 mg/dL (ref 8.7–10.3)
Chloride: 100 mmol/L (ref 96–106)
Creatinine, Ser: 0.83 mg/dL (ref 0.57–1.00)
Glucose: 100 mg/dL — ABNORMAL HIGH (ref 70–99)
Potassium: 4.4 mmol/L (ref 3.5–5.2)
Sodium: 139 mmol/L (ref 134–144)
eGFR: 71 mL/min/{1.73_m2} (ref >59)

## 2022-01-16 ENCOUNTER — Ambulatory Visit: Payer: Medicare Other | Admitting: Adult Health

## 2022-01-18 ENCOUNTER — Telehealth: Payer: Medicare Other

## 2022-01-18 ENCOUNTER — Telehealth: Payer: Self-pay | Admitting: *Deleted

## 2022-01-18 NOTE — Telephone Encounter (Signed)
?  Care Management  ? ?Follow Up Note ? ? ?01/18/2022 ?Name: Carmen [name] MRN: [id number] DOB: 10-30-1940 ? ? ?Referred by: Berniece Pap, FNP ?Reason for referral : Chronic Care Management (Falls, pain) ? ? ?An unsuccessful telephone outreach was attempted today. The patient was referred to the case management team for assistance with care management and care coordination.  ? ?Follow Up Plan: The care management team will reach out to the patient again over the next 30 days.  ? ?Rhae Lerner RN, MSN ?RN Care Management Coordinator ?[facility] ?[phone] ?[email or web address] ? ?

## 2022-01-21 ENCOUNTER — Other Ambulatory Visit: Payer: Self-pay | Admitting: Family

## 2022-01-21 DIAGNOSIS — G4700 Insomnia, unspecified: Secondary | ICD-10-CM

## 2022-01-22 NOTE — Progress Notes (Signed)
PROVIDER NOTE: Interpretation of information contained herein should be left to medically-trained personnel. Specific patient instructions are provided elsewhere under "Patient Instructions" section of medical record. This document was created in part using STT-dictation technology, any transcriptional errors that may result from this process are unintentional.  ?Patient: Carmen [name] ?Type: Established ?DOB: 11-28-1940 ?MRN: [id number] ?PCP: Berniece Pap, FNP  Service: Procedure ?DOS: 01/26/2022 ?Setting: Ambulatory ?Location: Ambulatory outpatient facility ?Delivery: Face-to-face Provider: Oswaldo Done, MD ?Specialty: Interventional Pain Management ?Specialty designation: 09 ?Location: Outpatient facility ?Ref. Prov.: [name], [name], F*   ? ?Primary Reason for Visit: Interventional Pain Management Treatment. ?CC: Back Pain (both) ?  ?Procedure:          ? Type: Lumbar Facet, Medial Branch Block(s) #3  ?Laterality: Bilateral  ?Level: L2, L3, L4, L5, & S1 Medial Branch Level(s). Injecting these levels blocks the L3-4 and L5-S1 lumbar facet joints. ?Imaging: Fluoroscopic guidance ?Anesthesia: Local anesthesia (1-2% Lidocaine) ?Anxiolysis: IV Versed 2.0 mg ?Sedation: None. ?DOS: 01/26/2022 ?Performed by: Oswaldo Done, MD ? ?Primary Purpose: Diagnostic/Therapeutic ?Indications: Low back pain severe enough to impact quality of life or function. ?1. Lumbar facet syndrome (Bilateral)   ?2. Lumbar facet arthropathy (Multilevel) (Bilateral)   ?3. Spondylosis without myelopathy or radiculopathy, lumbosacral region   ?4. 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 DDD (degenerative disc disease), lumbosacral   ?6. Levoscoliosis of lumbar spine   ?7. Abnormal CT scan, lumbar spine (05/11/2021)   ? Chronic anticoagulation (Eliquis)   ? ?NAS-11 Pain score:  ? Pre-procedure: 8 /10  ? Post-procedure: 8 /10  ? ?  ?Position / Prep / Materials:  ?Position: Prone  ?Prep  solution: DuraPrep (Iodine Povacrylex [0.7% available iodine] and Isopropyl Alcohol, 74% w/w) ?Area Prepped: Posterolateral Lumbosacral Spine (Wide prep: From the lower border of the scapula down to the end of the tailbone and from flank to flank.) ? ?Materials:  ?Tray: Block ?Needle(s):  ?Type: Spinal  ?Gauge (G): 22  ?Length: 5-in ?Qty: 4 ? ?Pre-op H&P Assessment:  ?Carmen [name] is a 81 y.o. (year old), female patient, seen today for interventional treatment. She  has a past surgical history that includes Cardiac catheterization; Coronary angioplasty; Coronary artery bypass graft; back injections; and Cataract extraction. Carmen [name] has a current medication list which includes the following prescription(s): aspirin ec, atorvastatin, calcium carbonate, cetirizine, cholecalciferol, clonazepam, eliquis, hydrochlorothiazide, [START ON 01/30/2022] hydrocodone-acetaminophen, [START ON 03/01/2022] hydrocodone-acetaminophen, [START ON 03/31/2022] hydrocodone-acetaminophen, levothyroxine, metoprolol succinate, moxifloxacin, prednisolone acetate, and trazodone, and the following Facility-Administered Medications: lactated ringers, lidocaine, midazolam, pentafluoroprop-tetrafluoroeth, ropivacaine (pf) 2 mg/ml (0.2%), and triamcinolone acetonide. Her primarily concern today is the Back Pain (both) ? ?Initial Vital Signs:  ?Pulse/HCG Rate: 84  ?Temp: (!) 97 ?F (36.1 ?C) ?Resp:   ?BP: (!) 149/85 ?SpO2: 96 % ? ?BMI: Estimated body mass index is 35.33 kg/m? as calculated from the following: ?  Height as of this encounter: 5\' 1"  (1.549 m). ?  Weight as of this encounter: 187 lb (84.8 kg). ? ?Risk Assessment: ?Allergies: Reviewed. She has No Known Allergies.  ?Allergy Precautions: None required ?Coagulopathies: Reviewed. None identified.  ?Blood-thinner therapy: None at this time ?Active Infection(s): Reviewed. None identified. Carmen [name] is afebrile ? ?Site Confirmation: Carmen [name] was asked to confirm the procedure and laterality before  marking the site ?Procedure checklist: Completed ?Consent: Before the procedure and under the influence of no sedative(s), amnesic(s), or anxiolytics, the patient was informed of the treatment options, risks  and possible complications. To fulfill our ethical and legal obligations, as recommended by the American Medical Association's Code of Ethics, I have informed the patient of my clinical impression; the nature and purpose of the treatment or procedure; the risks, benefits, and possible complications of the intervention; the alternatives, including doing nothing; the risk(s) and benefit(s) of the alternative treatment(s) or procedure(s); and the risk(s) and benefit(s) of doing nothing. ?The patient was provided information about the general risks and possible complications associated with the procedure. These may include, but are not limited to: failure to achieve desired goals, infection, bleeding, organ or nerve damage, allergic reactions, paralysis, and death. ?In addition, the patient was informed of those risks and complications associated to Spine-related procedures, such as failure to decrease pain; infection (i.e.: Meningitis, epidural or intraspinal abscess); bleeding (i.e.: epidural hematoma, subarachnoid hemorrhage, or any other type of intraspinal or peri-dural bleeding); organ or nerve damage (i.e.: Any type of peripheral nerve, nerve root, or spinal cord injury) with subsequent damage to sensory, motor, and/or autonomic systems, resulting in permanent pain, numbness, and/or weakness of one or several areas of the body; allergic reactions; (i.e.: anaphylactic reaction); and/or death. ?Furthermore, the patient was informed of those risks and complications associated with the medications. These include, but are not limited to: allergic reactions (i.e.: anaphylactic or anaphylactoid reaction(s)); adrenal axis suppression; blood sugar elevation that in diabetics may result in ketoacidosis or comma; water  retention that in patients with history of congestive heart failure may result in shortness of breath, pulmonary edema, and decompensation with resultant heart failure; weight gain; swelling or edema; medication-induced neural toxicity; particulate matter embolism and blood vessel occlusion with resultant organ, and/or nervous system infarction; and/or aseptic necrosis of one or more joints. ?Finally, the patient was informed that Medicine is not an exact science; therefore, there is also the possibility of unforeseen or unpredictable risks and/or possible complications that may result in a catastrophic outcome. The patient indicated having understood very clearly. We have given the patient no guarantees and we have made no promises. Enough time was given to the patient to ask questions, all of which were answered to the patient's satisfaction. Carmen [name] has indicated that she wanted to continue with the procedure. ?Attestation: I, the ordering provider, attest that I have discussed with the patient the benefits, risks, side-effects, alternatives, likelihood of achieving goals, and potential problems during recovery for the procedure that I have provided informed consent. ?Date  Time: 01/26/2022 11:18 AM ? ?Pre-Procedure Preparation:  ?Monitoring: As per clinic protocol. Respiration, ETCO2, SpO2, BP, heart rate and rhythm monitor placed and checked for adequate function ?Safety Precautions: Patient was assessed for positional comfort and pressure points before starting the procedure. ?Time-out: I initiated and conducted the "Time-out" before starting the procedure, as per protocol. The patient was asked to participate by confirming the accuracy of the "Time Out" information. Verification of the correct person, site, and procedure were performed and confirmed by me, the nursing staff, and the patient. "Time-out" conducted as per Joint Commission's Universal Protocol ([email or web address].01.01.01). ?Time:   ? ?Description of  Procedure:          ?Laterality: Bilateral. The procedure was performed in identical fashion on both sides. ?Targeted Levels:  L2, L3, L4, L5, & S1 Medial Branch Level(s) ? ?Safety Precautions: Aspiration looking for bl

## 2022-01-26 ENCOUNTER — Encounter: Payer: Self-pay | Admitting: Pain Medicine

## 2022-01-26 ENCOUNTER — Ambulatory Visit
Admission: RE | Admit: 2022-01-26 | Discharge: 2022-01-26 | Disposition: A | Discharge status: Home / Self Care | Payer: Medicare Other | Source: Ambulatory Visit | Attending: Pain Medicine | Admitting: Pain Medicine

## 2022-01-26 ENCOUNTER — Ambulatory Visit (HOSPITAL_BASED_OUTPATIENT_CLINIC_OR_DEPARTMENT_OTHER): Payer: Medicare Other | Admitting: Pain Medicine

## 2022-01-26 VITALS — BP 145/99 | HR 79 | Temp 97.0°F | Ht 61.0 in | Wt 187.0 lb

## 2022-01-26 DIAGNOSIS — Z7901 Long term (current) use of anticoagulants: Secondary | ICD-10-CM | POA: Diagnosis not present

## 2022-01-26 DIAGNOSIS — M4186 Other forms of scoliosis, lumbar region: Secondary | ICD-10-CM

## 2022-01-26 DIAGNOSIS — Z951 Presence of aortocoronary bypass graft: Secondary | ICD-10-CM | POA: Diagnosis not present

## 2022-01-26 DIAGNOSIS — Z955 Presence of coronary angioplasty implant and graft: Secondary | ICD-10-CM | POA: Insufficient documentation

## 2022-01-26 DIAGNOSIS — Z79899 Other long term (current) drug therapy: Secondary | ICD-10-CM | POA: Diagnosis not present

## 2022-01-26 DIAGNOSIS — M47896 Other spondylosis, lumbar region: Secondary | ICD-10-CM | POA: Insufficient documentation

## 2022-01-26 DIAGNOSIS — Z79891 Long term (current) use of opiate analgesic: Secondary | ICD-10-CM | POA: Diagnosis not present

## 2022-01-26 DIAGNOSIS — M545 Low back pain, unspecified: Secondary | ICD-10-CM

## 2022-01-26 DIAGNOSIS — G8929 Other chronic pain: Secondary | ICD-10-CM | POA: Diagnosis not present

## 2022-01-26 DIAGNOSIS — M47817 Spondylosis without myelopathy or radiculopathy, lumbosacral region: Secondary | ICD-10-CM | POA: Diagnosis not present

## 2022-01-26 DIAGNOSIS — Z79890 Hormone replacement therapy (postmenopausal): Secondary | ICD-10-CM | POA: Diagnosis not present

## 2022-01-26 DIAGNOSIS — M47816 Spondylosis without myelopathy or radiculopathy, lumbar region: Secondary | ICD-10-CM | POA: Insufficient documentation

## 2022-01-26 DIAGNOSIS — M5137 Other intervertebral disc degeneration, lumbosacral region: Secondary | ICD-10-CM | POA: Insufficient documentation

## 2022-01-26 DIAGNOSIS — Z7952 Long term (current) use of systemic steroids: Secondary | ICD-10-CM | POA: Diagnosis not present

## 2022-01-26 DIAGNOSIS — M4310 Spondylolisthesis, site unspecified: Secondary | ICD-10-CM | POA: Insufficient documentation

## 2022-01-26 DIAGNOSIS — R937 Abnormal findings on diagnostic imaging of other parts of musculoskeletal system: Secondary | ICD-10-CM | POA: Insufficient documentation

## 2022-01-26 DIAGNOSIS — M4807 Spinal stenosis, lumbosacral region: Secondary | ICD-10-CM | POA: Insufficient documentation

## 2022-01-26 DIAGNOSIS — Z7982 Long term (current) use of aspirin: Secondary | ICD-10-CM | POA: Diagnosis not present

## 2022-01-26 MED ORDER — LIDOCAINE HCL 2 % IJ SOLN
20.0000 mL | Freq: Once | INTRAMUSCULAR | Status: AC
  Administered 2022-01-26: 400 mg

## 2022-01-26 MED ORDER — ROPIVACAINE HCL 2 MG/ML IJ SOLN
18.0000 mL | Freq: Once | INTRAMUSCULAR | Status: AC
  Administered 2022-01-26: 18 mL via PERINEURAL

## 2022-01-26 MED ORDER — MIDAZOLAM HCL 5 MG/5ML IJ SOLN
INTRAMUSCULAR | Status: AC
  Filled 2022-01-26: qty 5

## 2022-01-26 MED ORDER — LACTATED RINGERS IV SOLN
1000.0000 mL | Freq: Once | INTRAVENOUS | Status: AC
  Administered 2022-01-26: 1000 mL via INTRAVENOUS

## 2022-01-26 MED ORDER — PENTAFLUOROPROP-TETRAFLUOROETH EX AERO
INHALATION_SPRAY | Freq: Once | CUTANEOUS | Status: AC
  Administered 2022-01-26: 30 via TOPICAL

## 2022-01-26 MED ORDER — TRIAMCINOLONE ACETONIDE 40 MG/ML IJ SUSP
80.0000 mg | Freq: Once | INTRAMUSCULAR | Status: AC
  Administered 2022-01-26: 80 mg

## 2022-01-26 MED ORDER — MIDAZOLAM HCL 5 MG/5ML IJ SOLN: 0.5000 mg | Freq: Once | INTRAMUSCULAR | Status: DC

## 2022-01-26 MED ORDER — ROPIVACAINE HCL 2 MG/ML IJ SOLN
INTRAMUSCULAR | Status: AC
  Filled 2022-01-26: qty 20

## 2022-01-26 MED ORDER — LIDOCAINE HCL 2 % IJ SOLN
INTRAMUSCULAR | Status: AC
  Filled 2022-01-26: qty 20

## 2022-01-26 MED ORDER — TRIAMCINOLONE ACETONIDE 40 MG/ML IJ SUSP
INTRAMUSCULAR | Status: AC
  Filled 2022-01-26: qty 2

## 2022-01-26 NOTE — Patient Instructions (Addendum)
____________________________________________________________________________________________ ? ?Virtual Visits  ? ?What is a "Virtual Visit"? ?It is a healthcare communication encounter (medical visit) that takes place on real time (NOT TEXT or E-MAIL) over the telephone or computer device (desktop, laptop, tablet, smart phone, etc.). It allows for more location flexibility between the patient and the healthcare provider. ? ?Who decides when these types of visits will be used? ?The physician. ? ?Who is eligible for these types of visits? ?Only those patients that can be reliably reached over the telephone. ? ?What do you mean by reliably? ?We do not have time to call everyone multiple times, therefore those that tend to screen calls and then call back later are not suitable candidates for this system. We understand how people are reluctant to pickup on "unknown" calls, therefore, we suggest adding our telephone numbers to your list of "CONTACT(s)". This way, you should be able to readily identify our calls when you receive one. All of our numbers are available below.  ? ?Who is not eligible? ?This option is not available for medication management encounters, specially for controlled substances. Patients on pain medications that fall under the category of controlled substances have to come in for "Face-to-Face" encounters. This is required for mandatory monitoring of these substances. You may be asked to provide a sample for an unannounced urine drug screening test (UDS), and we will need to count your pain pills. Not bringing your pills to be counted may result in no refill. Obviously, neither one of these can be done over the phone. ? ?When will this type of visits be used? ?You can request a virtual visit whenever you are physically unable to attend a regular appointment. The decision will be made by the physician (or healthcare provider) on a case by case basis.  ? ?At what time will I be called? ?This is an  excellent question. The providers will try to call you whenever they have time available. Do not expect to be called at any specific time. The secretaries will assign you a time for your virtual visit appointment, but this is done simply to keep a list of those patients that need to be called, but not for the purpose of keeping a time schedule. Be advised that the call may come in anytime during the day, between the hours of 8:00 AM and 8::00 PM, depending on provider availability. We do understand that the system is not perfect. If you are unable to be available that day on a moments notice, then request an "in-person" appointment rather than a "virtual visit". ? ?Can I request my medication visits to be "Virtual"? ?Yes you may request it, but the decision is entirely up to the healthcare provider. Control substances require specific monitoring that requires Face-to-Face encounters. The number of encounters  and the extent of the monitoring is determined on a case by case basis. ? ?Add a new contact to your smart phone and label it "PAIN CLINIC" ?Under this contact add the following numbers: ?Main: [phone] (Official Contact Number) ?Nurses: [phone] (These are outgoing only calling systems. Do not call this number.) ?Dr. Farhad Burleson: [phone] or [phone] (Outgoing calls only. Do not call this number.) ? ?____________________________________________________________________________________________ ? ____________________________________________________________________________________________ ? ?Post-Procedure Discharge Instructions ? ?Instructions: ?Apply ice:  ?Purpose: This will minimize any swelling and discomfort after procedure.  ?When: Day of procedure, as soon as you get home. ?How: Fill a plastic sandwich bag with crushed ice. Cover it with a small towel and apply to injection   site. ?How long: (15 min on, 15 min off) Apply for 15 minutes then remove x 15 minutes.  Repeat sequence on day of  procedure, until you go to bed. ?Apply heat:  ?Purpose: To treat any soreness and discomfort from the procedure. ?When: Starting the next day after the procedure. ?How: Apply heat to procedure site starting the day following the procedure. ?How long: May continue to repeat daily, until discomfort goes away. ?Food intake: Start with clear liquids (like water) and advance to regular food, as tolerated.  ?Physical activities: Keep activities to a minimum for the first 8 hours after the procedure. After that, then as tolerated. ?Driving: If you have received any sedation, be responsible and do not drive. You are not allowed to drive for 24 hours after having sedation. ?Blood thinner: (Applies only to those taking blood thinners) You may restart your blood thinner 6 hours after your procedure. ?Insulin: (Applies only to Diabetic patients taking insulin) As soon as you can eat, you may resume your normal dosing schedule. ?Infection prevention: Keep procedure site clean and dry. Shower daily and clean area with soap and water. ?Post-procedure Pain Diary: Extremely important that this be done correctly and accurately. Recorded information will be used to determine the next step in treatment. For the purpose of accuracy, follow these rules: ?Evaluate only the area treated. Do not report or include pain from an untreated area. For the purpose of this evaluation, ignore all other areas of pain, except for the treated area. ?After your procedure, avoid taking a long nap and attempting to complete the pain diary after you wake up. Instead, set your alarm clock to go off every hour, on the hour, for the initial 8 hours after the procedure. Document the duration of the numbing medicine, and the relief you are getting from it. ?Do not go to sleep and attempt to complete it later. It will not be accurate. If you received sedation, it is likely that you were given a medication that may cause amnesia. Because of this, completing the  diary at a later time may cause the information to be inaccurate. This information is needed to plan your care. ?Follow-up appointment: Keep your post-procedure follow-up evaluation appointment after the procedure (usually 2 weeks for most procedures, 6 weeks for radiofrequencies). DO NOT FORGET to bring you pain diary with you.  ? ?Expect: (What should I expect to see with my procedure?) ?From numbing medicine (AKA: Local Anesthetics): Numbness or decrease in pain. You may also experience some weakness, which if present, could last for the duration of the local anesthetic. ?Onset: Full effect within 15 minutes of injected. ?Duration: It will depend on the type of local anesthetic used. On the average, 1 to 8 hours.  ?From steroids (Applies only if steroids were used): Decrease in swelling or inflammation. Once inflammation is improved, relief of the pain will follow. ?Onset of benefits: Depends on the amount of swelling present. The more swelling, the longer it will take for the benefits to be seen. In some cases, up to 10 days. ?Duration: Steroids will stay in the system x 2 weeks. Duration of benefits will depend on multiple posibilities including persistent irritating factors. ?Side-effects: If present, they may typically last 2 weeks (the duration of the steroids). ?Frequent: Cramps (if they occur, drink Gatorade and take over-the-counter Magnesium 450-500 mg once to twice a day); water retention with temporary weight gain; increases in blood sugar; decreased immune system response; increased appetite. ?Occasional: Facial flushing (red, warm   cheeks); mood swings; menstrual changes. ?Uncommon: Long-term decrease or suppression of natural hormones; bone thinning. (These are more common with higher doses or more frequent use. This is why we prefer that our patients avoid having any injection therapies in other practices.)  ?Very Rare: Severe mood changes; psychosis; aseptic necrosis. ?From procedure: Some  discomfort is to be expected once the numbing medicine wears off. This should be minimal if ice and heat are applied as instructed. ? ?Call if: (When should I call?) ?You experience numbness and weakness that get

## 2022-01-26 NOTE — Progress Notes (Signed)
Safety precautions to be maintained throughout the outpatient stay will include: orient to surroundings, keep bed in low position, maintain call bell within reach at all times, provide assistance with transfer out of bed and ambulation.  

## 2022-01-27 ENCOUNTER — Telehealth: Payer: Self-pay

## 2022-01-27 NOTE — Telephone Encounter (Signed)
Post procedure phone call.  Patient states she is doing great 

## 2022-01-30 ENCOUNTER — Ambulatory Visit (INDEPENDENT_AMBULATORY_CARE_PROVIDER_SITE_OTHER): Payer: Medicare Other | Admitting: *Deleted

## 2022-01-30 DIAGNOSIS — G894 Chronic pain syndrome: Secondary | ICD-10-CM

## 2022-01-30 DIAGNOSIS — R296 Repeated falls: Secondary | ICD-10-CM

## 2022-01-30 DIAGNOSIS — I10 Essential (primary) hypertension: Secondary | ICD-10-CM

## 2022-02-01 DIAGNOSIS — H5203 Hypermetropia, bilateral: Secondary | ICD-10-CM | POA: Diagnosis not present

## 2022-02-01 DIAGNOSIS — H524 Presbyopia: Secondary | ICD-10-CM | POA: Diagnosis not present

## 2022-02-01 DIAGNOSIS — H52223 Regular astigmatism, bilateral: Secondary | ICD-10-CM | POA: Diagnosis not present

## 2022-02-01 DIAGNOSIS — H04123 Dry eye syndrome of bilateral lacrimal glands: Secondary | ICD-10-CM | POA: Diagnosis not present

## 2022-02-02 NOTE — Patient Instructions (Addendum)
Visit Information ? ?Thank you for taking time to visit with me today. Please don't hesitate to contact me if I can be of assistance to you before our next scheduled telephone appointment. ? ?Following are the goals we discussed today:  ?Take all medications as prescribed ?Attend all scheduled provider appointments ?Call provider office for new concerns or questions  ?Use walker for all ambulation ? ?Our next appointment is by telephone on Care Guide will schedule with new practice CCM Nurse Care Manager ? ?Please call the care guide team at [phone] if you need to cancel or reschedule your appointment.  ? ?If you are experiencing a Mental Health or Behavioral Health Crisis or need someone to talk to, please call the Suicide and Crisis Lifeline: 988 ?call the [place] National Suicide Prevention Lifeline: (606)586-0457 or TTY: 458-844-5261 TTY 650-642-7296) to talk to a trained counselor ?call 1-800-273-TALK (toll free, 24 hour hotline) ?call 911  ? ?Patient verbalizes understanding of instructions and care plan provided today and agrees to view in MyChart. Active MyChart status confirmed with patient.   ? ?Rhae Lerner RN, MSN ?RN Care Management Coordinator ?[facility] ?[phone] ?[email or web address] ? ?

## 2022-02-02 NOTE — Chronic Care Management (AMB) (Signed)
?Chronic Care Management  ? ?CCM RN Visit Note ? ?02/02/2022 ?Name: Carmen [name] MRN: [id number] DOB: Nov 21, 1940 ? ?Subjective: ?Carmen [name] is a 81 y.o. year old female who is a primary care patient of [name], [name], FNP. The care management team was consulted for assistance with disease management and care coordination needs.   ? ?Engaged with patient by telephone for follow up visit in response to provider referral for case management and/or care coordination services.  ? ?Consent to Services:  ?The patient was given information about Chronic Care Management services, agreed to services, and gave verbal consent prior to initiation of services.  Please see initial visit note for detailed documentation.  ? ?Patient agreed to services and verbal consent obtained.  ? ?Assessment: Review of patient past medical history, allergies, medications, health status, including review of consultants reports, laboratory and other test data, was performed as part of comprehensive evaluation and provision of chronic care management services.  ? ?SDOH (Social Determinants of Health) assessments and interventions performed:   ? ?CCM Care Plan ? ?No Known Allergies ? ?Outpatient Encounter Medications as of 01/30/2022  ?Medication Sig Note  ? aspirin EC 81 MG tablet Take 81 mg by mouth daily. Swallow whole.   ? atorvastatin (LIPITOR) 20 MG tablet TAKE 1 TABLET BY MOUTH EVERYDAY AT BEDTIME   ? calcium carbonate (OSCAL) 1500 (600 Ca) MG TABS tablet Take 1 tablet (1,500 mg total) by mouth 2 (two) times daily with a meal.   ? cetirizine (ZYRTEC) 10 MG tablet Take 10 mg by mouth daily.   ? CHOLECALCIFEROL PO Take 1 capsule by mouth daily.   ? clonazePAM (KLONOPIN) 0.5 MG tablet TAKE 1 TABLET BY MOUTH EVERY DAY AS NEEDED FOR ANXIETY   ? ELIQUIS 5 MG TABS tablet TAKE 1 TABLET BY MOUTH TWICE A DAY 01/26/2022: She stopped Eliquis 3 days ago for a procedure  ? hydrochlorothiazide (HYDRODIURIL) 25 MG tablet Take 1 tablet (25 mg total) by mouth  daily.   ? HYDROcodone-acetaminophen (NORCO/VICODIN) 5-325 MG tablet Take 1 tablet by mouth 2 (two) times daily as needed for severe pain. Must last 30 days.   ? [START ON 03/01/2022] HYDROcodone-acetaminophen (NORCO/VICODIN) 5-325 MG tablet Take 1 tablet by mouth 2 (two) times daily as needed for severe pain. Must last 30 days.   ? [START ON 03/31/2022] HYDROcodone-acetaminophen (NORCO/VICODIN) 5-325 MG tablet Take 1 tablet by mouth 2 (two) times daily as needed for severe pain. Must last 30 days. 01/11/2022: WARNING: Not a Duplicate. Future prescription. DO NOT DELETE during hospital medication reconciliation or at discharge. ?[facility] Chronic Pain Management Patient   ? levothyroxine (SYNTHROID) 100 MCG tablet Take 1 tablet (100 mcg total) by mouth daily before breakfast.   ? metoprolol succinate (TOPROL-XL) 100 MG 24 hr tablet Take 50 mg by mouth daily.   ? moxifloxacin (VIGAMOX) 0.5 % ophthalmic solution Place 1 drop into the left eye 4 (four) times daily.   ? prednisoLONE acetate (PRED FORTE) 1 % ophthalmic suspension Place 1 drop into the left eye 4 (four) times daily.   ? traZODone (DESYREL) 100 MG tablet TAKE 1 TABLET BY MOUTH AT BEDTIME AS NEEDED FOR SLEEP.   ? ?No facility-administered encounter medications on file as of 01/30/2022.  ? ? ?Patient Active Problem List  ? Diagnosis Date Noted  ? Levoscoliosis of lumbar spine 01/26/2022  ? Lumbosacral foraminal stenosis (Multilevel) (Bilateral) 01/26/2022  ? Degenerative lateral spondylolisthesis of L4/L5 (12mm) 01/26/2022  ? Coronary artery disease involving coronary bypass graft  of native heart without angina pectoris 12/08/2021  ? Permanent atrial fibrillation (HCC) 12/08/2021  ? Anxiety 10/18/2021  ? Leg edema 10/18/2021  ? DDD (degenerative disc disease), lumbosacral 05/31/2021  ? Lumbar facet syndrome (Bilateral) 05/17/2021  ? Lumbosacral facet hypertrophy (Multilevel) (Bilateral) 05/17/2021  ? Lumbar facet arthropathy (Multilevel) (Bilateral) 05/17/2021  ?  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/17/2021  ? Cervical facet syndrome (Bilateral) 05/17/2021  ? Cervical facet hypertrophy (Multilevel) (Bilateral) 05/17/2021  ? Spondylosis without myelopathy or radiculopathy, cervical region 05/17/2021  ? Spondylosis without myelopathy or radiculopathy, lumbosacral region 05/17/2021  ? Other intervertebral disc degeneration, lumbar region 05/17/2021  ? Abnormal MRI, cervical spine (05/12/2021) 05/17/2021  ? Abnormal CT scan, lumbar spine (05/11/2021) 05/17/2021  ? Chronic cervical radiculopathy (Bilateral) 05/02/2021  ? DDD (degenerative disc disease), cervical 05/02/2021  ? Foraminal stenosis of cervical region 05/02/2021  ? Vitamin D deficiency 04/30/2021  ? Osteopenia determined by x-ray 04/30/2021  ? Lumbar compression fracture (Old) (Multilevel), sequela 04/30/2021  ? Chronic pain syndrome 03/14/2021  ? Pharmacologic therapy 03/14/2021  ? Disorder of skeletal system 03/14/2021  ? Problems influencing health status 03/14/2021  ? Cervicalgia 03/14/2021  ? Chronic neck pain (2ry area of Pain) (Bilateral) (L>R) 03/14/2021  ? Chronic lower extremity pain (3ry area of Pain) (Bilateral) (L>R) 03/14/2021  ? Pain in both lower extremities 03/14/2021  ? Chronic hand pain (Left) 03/14/2021  ? Chronic hand pain (Right) 03/14/2021  ? Chronic low back pain (1ry area of Pain) (Bilateral) (R>L) w/o sciatica 02/17/2021  ? Difficulty sleeping 02/17/2021  ? Dry skin dermatitis 02/03/2021  ? At risk for falls 02/03/2021  ? Athlete's foot on right 02/03/2021  ? Essential hypertension 11/26/2020  ? Falls frequently 11/18/2020  ? Paroxysmal atrial fibrillation (HCC) 11/18/2020  ? Repeated falls 11/18/2020  ? Acute encephalopathy 10/06/2020  ? CVA (cerebral vascular accident) (HCC) 10/06/2020  ? Atrial flutter (HCC) 10/06/2020  ? Peripheral vascular disease (HCC) 10/06/2020  ? Depression, unspecified 10/02/2020  ? Acquired hypothyroidism 10/02/2020  ? Athscl heart disease of  native coronary artery w/o ang pctrs 10/02/2020  ? Vitamin B deficiency, unspecified 10/02/2020  ? Other chronic pain 10/02/2020  ? Hyperlipidemia, unspecified 10/02/2020  ? Personal history of nicotine dependence 10/02/2020  ? Prsnl hx of TIA (TIA), and cereb infrc w/o resid deficits 10/02/2020  ? Anemia, unspecified 10/02/2020  ? Dorsalgia, unspecified 10/02/2020  ? History of stroke 10/02/2020  ? Chronic anticoagulation (Eliquis) 10/02/2020  ? 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  ? Presence of coronary angioplasty implant and graft 10/02/2020  ? ? ?Conditions to be addressed/monitored:HTN and falls ? ?[facility]  [facility] Plan of Care (Adult)  ?Updates made by Maple Mirzaarpley, Alexey Rhoads D, RN since 02/02/2022 12:00 AM  ?  ? ?Problem: Knowledge deficit related to swlf care maagement  of chronic medical comditions   ?Priority: Medium  ?  ? ?Long-Range Goal: Patient's daughter will work with CCM team to gather knowledge to better manage mothers chronic mecical conditions   ?Start Date: 08/24/2021  ?Expected End Date: 08/24/2022  ?Priority: Medium  ?Note:   ?Current Barriers:  ?Knowledge Deficits related to plan of care for management of Pain and Falls  ?Care Coordination needs related to Level of care concerns    Knowledge Deficits related to fall precautions in patient with greater than 20 falls in the last year per daughter.  Denies actual injuries with falls but lots of bruising.  History of stroke and atrial fibrillation with residual short term  memory loss.  Patient in retirement community [facility]  Per daughter, patient continues to adjust well and becoming more comfortable at facility.  Has been more involved with facility activities. Patient has remained COVID free per daughter.  Using Rolator walker to ambulate; continues to be very active with her ambulation.  Daughter reports one fall in the last 2 months.  Daughter reports increase in swelling in ankles and feet left  greater than right.  Swelling sometimes resolves with elevation or overnight.  Denies any increase in shortness of breath.  Plans for cataract surgery next month ?2/10--Daughter reports patient is doing well.  With

## 2022-02-05 NOTE — Progress Notes (Signed)
Patient: Carmen [name]  Service Category: E/M  Provider: Oswaldo DoneFrancisco A Javanni Maring, MD  ?DOB: 1941/03/08  DOS: 02/09/2022  Location: Office  ?MRN: [id number]  Setting: Ambulatory outpatient  Referring Provider: Berniece PapFlinchum, Michelle S, F*  ?Type: Established Patient  Specialty: Interventional Pain Management  PCP: Berniece PapFlinchum, Michelle S, FNP  ?Location: Remote location  Delivery: TeleHealth    ? ?Virtual Encounter - Pain Management ?PROVIDER NOTE: Information contained herein reflects review and annotations entered in association with encounter. Interpretation of such information and data should be left to medically-trained personnel. Information provided to patient can be located elsewhere in the medical record under "Patient Instructions". Document created using STT-dictation technology, any transcriptional errors that may result from process are unintentional.  ?  ?Contact & Pharmacy ?Preferred: [phone] ?Home: [phone] (home) ?Mobile: [phone] (mobile) ?E-mail: [name]  ?[facility] [address] - [place], [place] - [id number] ?[address] ?[place] [address] ?Phone: [phone] Fax: [phone] ? ?[id number] - [place] - [address] ?[address] ?[place] [id number] ?Phone: [phone] Fax: [phone] ?  ?Pre-screening  ?Carmen [name] offered "in-person" vs "virtual" encounter. [name] indicated preferring virtual for this encounter.  ? ?Reason ?COVID-19*  Social distancing based on CDC and AMA recommendations.  ? ?I contacted Carmen [name] on 02/09/2022 via telephone.      I clearly identified myself as Carmen DoneFrancisco A Carmen Tarbell, MD. I verified that I was speaking with the correct person using two identifiers (Name: Carmen [name], and date of birth: 1941/03/08). ? ?Consent ?I sought verbal advanced consent from Carmen [name] for virtual visit interactions. I informed Carmen [name] of possible security and privacy concerns, risks, and limitations associated with providing  "not-in-person" medical evaluation and management services. I also informed Carmen [name] of the availability of "in-person" appointments. Finally, I informed her that there would be a charge for the virtual visit and that [name] could be  personally, fully or partially, financially responsible for it. Carmen [name] expressed understanding and agreed to proceed.  ? ?Historic Elements   ?Carmen [name] is a 81 y.o. year old, female patient evaluated today after our last contact on 01/26/2022. Carmen [name]  has a past medical history of Allergy, Anemia, Anxiety, Arthritis, Back pain, Coronary artery disease, Depression, Hypertension, Peripheral vascular disease (HCC), Stroke (HCC), and Thyroid disease. [name] also  has a past surgical history that includes Cardiac catheterization; Coronary angioplasty; Coronary artery bypass graft; back injections; and Cataract extraction. Carmen [name] has a current medication list which includes the following prescription(s): aspirin ec, atorvastatin, calcium carbonate, cetirizine, cholecalciferol, clonazepam, eliquis, hydrochlorothiazide, hydrocodone-acetaminophen, [START ON 03/01/2022] hydrocodone-acetaminophen, [START ON 03/31/2022] hydrocodone-acetaminophen, levothyroxine, metoprolol succinate, moxifloxacin, prednisolone acetate, and trazodone. [name]  reports that [name] quit smoking about 21 months ago. Her smoking use included cigarettes. [name] has never used smokeless tobacco. [name] reports that [name] does not currently use alcohol. [name] reports that [name] does not use drugs. Carmen [name] has No Known Allergies.  ? ?HPI  ?Today, [name] is being contacted for a post-procedure assessment.  Today I spoke to the patient's daughter which provided us with the results of her mother's lumbar facet block.  [name] indicated that [name] attained 100% relief of the pain for the duration of the local anesthetic and [name] is currently enjoying an ongoing 90% improvement in her pain.  According to the daughter [name] has been using a lot less  pain medicine and [name] has been getting around much better.  They were encouraged to give us a call when the pain starts  to return.  I have informed them that the options to treat this, depending on how long the block last, could consist of repeating the injections or proceeding with radiofrequency ablation. ? ?Post-procedure evaluation  ? Type: Lumbar Facet, Medial Branch Block(s) #3  ?Laterality: Bilateral  ?Level: L2, L3, L4, L5, & S1 Medial Branch Level(s). Injecting these levels blocks the L3-4 and L5-S1 lumbar facet joints. ?Imaging: Fluoroscopic guidance ?Anesthesia: Local anesthesia (1-2% Lidocaine) ?Anxiolysis: IV Versed 2.0 mg ?Sedation: None. ?DOS: 01/26/2022 ?Performed by: Carmen Done, MD ? ?Primary Purpose: Diagnostic/Therapeutic ?Indications: Low back pain severe enough to impact quality of life or function. ?1. Lumbar facet syndrome (Bilateral)   ?2. Lumbar facet arthropathy (Multilevel) (Bilateral)   ?3. Spondylosis without myelopathy or radiculopathy, lumbosacral region   ?4. 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 DDD (degenerative disc disease), lumbosacral   ?6. Levoscoliosis of lumbar spine   ?7. Abnormal CT scan, lumbar spine (05/11/2021)   ? Chronic anticoagulation (Eliquis)   ? ?NAS-11 Pain score:  ? Pre-procedure: 8 /10  ? Post-procedure: 8 /10  ? ?   ?Effectiveness:  ?Initial hour after procedure:   100%. ?Subsequent 4-6 hours post-procedure:   100%. ?Analgesia past initial 6 hours:   90%. ?Ongoing improvement:  ?Analgesic: The patient's daughter indicates that [name] is currently enjoying an ongoing 90% improvement in her low back pain. ?Function: Carmen [name] reports improvement in function ?ROM: Carmen [name] reports improvement in ROM ? ?Pharmacotherapy Assessment  ? ?Opioid Analgesic: Hydrocodone/APAP 5/325, 1 tab p.o. BID (10 mg/day of hydrocodone) (10 MME) (last filled on 05/02/2021) ?MME/day: 10 mg/day  ? ?Monitoring: ?[place] PMP: PDMP reviewed during  this encounter.       ?Pharmacotherapy: No side-effects or adverse reactions reported. ?Compliance: No problems identified. ?Effectiveness: Clinically acceptable. ?Plan: Refer to "POC". UDS:  ?Summary  ?Date Value Ref Range Status  ?03/14/2021 Note  Final  ?  Comment:  ?  ==================================================================== ?Compliance Drug Analysis, Ur ?==================================================================== ?Test                             Result       Flag       Units ? ?Drug Present and Declared for Prescription Verification ?  Hydrocodone                    900          EXPECTED   ng/mg creat ?  Hydromorphone                  196          EXPECTED   ng/mg creat ?  Dihydrocodeine                 166          EXPECTED   ng/mg creat ?  Norhydrocodone                 1484         EXPECTED   ng/mg creat ?   Sources of hydrocodone include scheduled prescription medications. ?   Hydromorphone, dihydrocodeine and norhydrocodone are expected ?   metabolites of hydrocodone. Hydromorphone and dihydrocodeine are ?   also available as scheduled prescription medications. ? ?  Trazodone                      PRESENT  EXPECTED ?  1,3 chlorophenyl piperazine    PRESENT      EXPECTED ?   1,3-chlorophenyl piperazine is an expected metabolite of trazodone. ? ?  Acetaminophen                  PRESENT      EXPECTED ?  Metoprolol                     PRESENT      EXPECTED ? ?Drug Present not Declared for Prescription Verification ?  Ephedrine/Pseudoephedrine      PRESENT      UNEXPECTED ?  Phenylpropanolamine            PRESENT      UNEXPECTED ?   Source of ephedrine/pseudoephedrine is most commonly pseudoephedrine ?   in over-the-counter or prescription cold and allergy medications. ?   Phenylpropanolamine is an expected metabolite of ?   ephedrine/pseudoephedrine. ? ?  Guaifenesin                    PRESENT      UNEXPECTED ?   Guaifenesin may be administered as an over-the-counter or ?   prescription  drug; it may also be present as a breakdown product of ?   methocarbamol. ? ?Drug Absent but Declared for Prescription Verification ?  Clonazepam                     Not Detected UNEXPECTED ng/mg creat ?  Sal

## 2022-02-09 ENCOUNTER — Ambulatory Visit: Payer: Medicare Other | Attending: Pain Medicine | Admitting: Pain Medicine

## 2022-02-09 DIAGNOSIS — M47816 Spondylosis without myelopathy or radiculopathy, lumbar region: Secondary | ICD-10-CM

## 2022-02-09 DIAGNOSIS — G8929 Other chronic pain: Secondary | ICD-10-CM | POA: Diagnosis not present

## 2022-02-09 DIAGNOSIS — M545 Low back pain, unspecified: Secondary | ICD-10-CM

## 2022-03-01 DIAGNOSIS — I10 Essential (primary) hypertension: Secondary | ICD-10-CM

## 2022-03-16 ENCOUNTER — Other Ambulatory Visit: Payer: Self-pay | Admitting: Internal Medicine

## 2022-03-16 DIAGNOSIS — E785 Hyperlipidemia, unspecified: Secondary | ICD-10-CM

## 2022-03-17 ENCOUNTER — Encounter: Payer: Self-pay | Admitting: Internal Medicine

## 2022-03-17 ENCOUNTER — Ambulatory Visit: Payer: Medicare Other | Admitting: Internal Medicine

## 2022-03-17 VITALS — BP 142/90 | HR 87 | Ht 64.0 in | Wt 184.6 lb

## 2022-03-17 DIAGNOSIS — Z79899 Other long term (current) drug therapy: Secondary | ICD-10-CM

## 2022-03-17 DIAGNOSIS — I25810 Atherosclerosis of coronary artery bypass graft(s) without angina pectoris: Secondary | ICD-10-CM | POA: Diagnosis not present

## 2022-03-17 DIAGNOSIS — E785 Hyperlipidemia, unspecified: Secondary | ICD-10-CM

## 2022-03-17 DIAGNOSIS — I10 Essential (primary) hypertension: Secondary | ICD-10-CM | POA: Diagnosis not present

## 2022-03-17 DIAGNOSIS — I4821 Permanent atrial fibrillation: Secondary | ICD-10-CM | POA: Diagnosis not present

## 2022-03-17 MED ORDER — METOPROLOL SUCCINATE ER 100 MG PO TB24: 50.0000 mg | ORAL_TABLET | Freq: Every day | ORAL | 3 refills | Status: DC

## 2022-03-17 MED ORDER — HYDROCHLOROTHIAZIDE 25 MG PO TABS: 25.0000 mg | ORAL_TABLET | Freq: Every day | ORAL | 3 refills | Status: DC

## 2022-03-17 MED ORDER — ATORVASTATIN CALCIUM 40 MG PO TABS: 40.0000 mg | ORAL_TABLET | Freq: Every day | ORAL | 1 refills | Status: DC

## 2022-03-17 MED ORDER — APIXABAN 5 MG PO TABS: 5.0000 mg | ORAL_TABLET | Freq: Two times a day (BID) | ORAL | 3 refills | Status: DC

## 2022-03-17 NOTE — Patient Instructions (Signed)
Medication Instructions:   Your physician has recommended you make the following change in your medication:   INCREASE Lipitor (atorvastatin) 40 mg daily - A new Rx has been sent to your pharmacy  *If you need a refill on your cardiac medications before your next appointment, please call your pharmacy*   Lab Work:  Your physician recommends that you return for FASTING lab work in: 3 MONTHS (Mid September 2023) Lipid panel / ALT  -  Please go to the [facility] Entr[facility] at [facility] -  Check in at the Registration Desk: 1st desk to the right, past the screening table -  Valet Parking is offered if needed -  No appointment needed -  Lab hours: Monday- Friday (7:30 am- 5:30 pm)    Testing/Procedures:  None ordered   Follow-Up: At [facility], you and your health needs are our priority.  As part of our continuing mission to provide you with exceptional heart care, we have created designated Provider Care Teams.  These Care Teams include your primary Cardiologist (physician) and Advanced Practice Providers (APPs -  Physician Assistants and Nurse Practitioners) who all work together to provide you with the care you need, when you need it.  We recommend signing up for the patient portal called "MyChart".  Sign up information is provided on this After Visit Summary.  MyChart is used to connect with patients for Virtual Visits (Telemedicine).  Patients are able to view lab/test results, encounter notes, upcoming appointments, etc.  Non-urgent messages can be sent to your provider as well.   To learn more about what you can do with MyChart, go to [email or web address].    Your next appointment:   6 month(s)  The format for your next appointment:   In Person  Provider:   You may see Yvonne Kendall, MD or one of the following Advanced Practice Providers on your designated Care Team:   Nicolasa Ducking, NP Eula Listen, PA-C Cadence Fransico Michael, PA-C{   Important Information About  [employer]

## 2022-03-17 NOTE — Progress Notes (Signed)
Follow-up Outpatient Visit Date: 03/17/2022  Primary Care Provider: Berniece Pap, FNP [address] Laurell Josephs [address] [place] [address]  Chief Complaint: Follow-up CAD and atrial fibrillation  HPI:  Carmen [name] is a 81 y.o. female with history of coronary artery disease status post CABG (2005, LIMA-LAD, sequential SVG-D-OM, and SVG-PDA) and subsequent PCI due to failure of multiple grafts (Taxus stents to proximal LAD, mid LCx, and RCA), persistent atrial fibrillation, stroke, PAD, hypertension, hyperlipidemia, hypothyroidism, anemia, chronic pain syndrome, and depression/anxiety, who presents for follow-up of coronary artery disease and atrial fibrillation.  I last saw her in March, at which time Carmen [name] complained of a several year history of bilateral leg pain.  Chronic DOE was stable.  She also noted a few falls.  Due to elevated blood pressure and mild leg edema, we agreed to increase HCTZ to 25 mg daily.  Today, Carmen [name] feels about the same as at prior visits, though her daughter feels like DOE and leg edema may be a little better following escalation of HCTZ.  She remains bothered primarily by back pain and is considering nerve ablation procedure.  She has not had any chest pain (though her back pain sometimes radiates up to the left shoulder/arm).  She also denies palpitations and lightheadedness.  She denies bleeding.  Home BP is typically in the 110's/70's..  No bleeding.  Home BP usually 100's/70's.  --------------------------------------------------------------------------------------------------  Cardiovascular History & Procedures: Cardiovascular Problems: Coronary artery disease status post CABG and PCI Paroxysmal atrial fibrillation Peripheral vascular disease   Risk Factors: Known CAD and PAD, stroke, obesity, tobacco use, and age greater than 2   Cath/PCI: LHC/PCI (2006): Taxus DES to proximal LAD, mid LCx, and RCA x2.   CV Surgery: CABG (2005, LIMA to LAD,  SVG to diagonal and OM, and SVG to PDA)   EP Procedures and Devices: None   Non-Invasive Evaluation(s): TTE (12/14/2020): Normal LV size with moderate LVH.  LVEF 55-60%.  Indeterminate diastolic function.  Normal RV size and function.  Normal PA pressure.  Mild left atrial enlargement.  Mild mitral and tricuspid regurgitation.  Normal CVP. 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  Recent CV Pertinent Labs: Lab Results  Component Value Date   CHOL 182 07/26/2021   HDL 50.10 07/26/2021   LDLDIRECT 99.0 07/26/2021   TRIG 292.0 (H) 07/26/2021   CHOLHDL 4 07/26/2021   INR 1.2 03/14/2021   K 4.4 01/11/2022   MG 1.6 10/18/2021   BUN 18 01/11/2022   CREATININE 0.83 01/11/2022    Past medical and surgical history were reviewed and updated in EPIC.  Current Meds  Medication Sig   aspirin EC 81 MG tablet Take 81 mg by mouth daily. Swallow whole.   atorvastatin (LIPITOR) 20 MG tablet TAKE 1 TABLET BY MOUTH EVERYDAY AT BEDTIME   calcium carbonate (OSCAL) 1500 (600 Ca) MG TABS tablet Take 1 tablet (1,500 mg total) by mouth 2 (two) times daily with a meal.   cetirizine (ZYRTEC) 10 MG tablet Take 10 mg by mouth daily.   CHOLECALCIFEROL PO Take 1 capsule by mouth daily.   clonazePAM (KLONOPIN) 0.5 MG tablet TAKE 1 TABLET BY MOUTH EVERY DAY AS NEEDED FOR ANXIETY   ELIQUIS 5 MG TABS tablet TAKE 1 TABLET BY MOUTH TWICE A DAY   hydrochlorothiazide (HYDRODIURIL) 25 MG tablet TAKE 1 TABLET (25 MG TOTAL) BY MOUTH DAILY.   HYDROcodone-acetaminophen (NORCO/VICODIN) 5-325 MG tablet Take 1 tablet by mouth 2 (two) times daily as  needed for severe pain. Must last 30 days.   [START ON 03/31/2022] HYDROcodone-acetaminophen (NORCO/VICODIN) 5-325 MG tablet Take 1 tablet by mouth 2 (two) times daily as needed for severe pain. Must last 30 days.   levothyroxine (SYNTHROID) 100 MCG tablet Take 1 tablet (100 mcg total) by mouth daily before breakfast.   metoprolol succinate (TOPROL-XL) 100 MG 24  hr tablet Take 50 mg by mouth daily.   moxifloxacin (VIGAMOX) 0.5 % ophthalmic solution Place 1 drop into the left eye 4 (four) times daily.   prednisoLONE acetate (PRED FORTE) 1 % ophthalmic suspension Place 1 drop into the left eye 4 (four) times daily.   traZODone (DESYREL) 100 MG tablet TAKE 1 TABLET BY MOUTH AT BEDTIME AS NEEDED FOR SLEEP.    Allergies: Patient has no known allergies.  Social History   Tobacco Use   Smoking status: Former    Types: Cigarettes    Quit date: 05/2020    Years since quitting: 1.8   Smokeless tobacco: Never   Tobacco comments:    Smoked about 1 pack per month  Vaping Use   Vaping Use: Never used  Substance Use Topics   Alcohol use: Not Currently   Drug use: Never    Family History  Problem Relation Age of Onset   Breast cancer Mother    Bipolar disorder Daughter    Heart attack Father    Diabetes Mellitus II Neg Hx     Review of Systems: A 12-system review of systems was performed and was negative except as noted in the HPI.  --------------------------------------------------------------------------------------------------  Physical Exam: BP (!) 142/90 (BP Location: Right Arm, Patient Position: Sitting, Cuff Size: Normal)   Pulse 87   Ht 5\' 4"  (1.626 m)   Wt 184 lb 9.6 oz (83.7 kg)   SpO2 92%   BMI 31.69 kg/m   General:  NAD. Neck: No JVD or HJR. Lungs: Clear to auscultation bilaterally without wheezes or crackles. Heart: Irregularly irregular rhythm without murmurs, rubs, or gallops. Abdomen: Soft, nontender, nondistended. Extremities: Trace pretibial edema.  EKG:  Atrial fibrillation with PVC versus aberrancy and nonspecific ST/T changes.  Lab Results  Component Value Date   WBC 6.0 10/18/2021   HGB 12.2 10/18/2021   HCT 37.7 10/18/2021   MCV 92.0 10/18/2021   PLT 225.0 10/18/2021    Lab Results  Component Value Date   NA 139 01/11/2022   K 4.4 01/11/2022   CL 100 01/11/2022   CO2 24 01/11/2022   BUN 18  01/11/2022   CREATININE 0.83 01/11/2022   GLUCOSE 100 (H) 01/11/2022   ALT 14 10/18/2021    Lab Results  Component Value Date   CHOL 182 07/26/2021   HDL 50.10 07/26/2021   LDLDIRECT 99.0 07/26/2021   TRIG 292.0 (H) 07/26/2021   CHOLHDL 4 07/26/2021    --------------------------------------------------------------------------------------------------  ASSESSMENT AND PLAN: CAD: No angina reported.  Chronic DOE stable.  Continue aspirin apixaban, as tolerated, given history of CABG and multiple PCI's (including Taxus stents).  Increase atorvastatin to 40 mg daily to target LDL < 70.  Permanent a-fib: HR remains adequately controlled.  Continue indefinite anticoagulation with apixaban 5 mg BID.  Hypertension: BP mildly elevated today but better than at prior visit and typically well within the normal range at home.  Continue current medications.  Hyperlipidemia: Last lipid panel in 07/2021 notable for LDL 99 and TG 292.  We have agreed to increase atorvastatin to 40 mg daily, with repeat lipid panel and ALT in ~  3 months.  Follow-up: Return to clinic in 6 months.  Yvonne Kendall, MD 03/17/2022 8:58 AM

## 2022-03-23 DIAGNOSIS — T162XXA Foreign body in left ear, initial encounter: Secondary | ICD-10-CM | POA: Diagnosis not present

## 2022-03-30 ENCOUNTER — Ambulatory Visit (INDEPENDENT_AMBULATORY_CARE_PROVIDER_SITE_OTHER): Payer: Medicare Other | Admitting: Family

## 2022-03-30 ENCOUNTER — Encounter: Payer: Self-pay | Admitting: Family

## 2022-03-30 VITALS — BP 142/102 | HR 89 | Temp 98.2°F | Resp 97 | Ht 64.0 in | Wt 184.0 lb

## 2022-03-30 DIAGNOSIS — G894 Chronic pain syndrome: Secondary | ICD-10-CM

## 2022-03-30 DIAGNOSIS — I739 Peripheral vascular disease, unspecified: Secondary | ICD-10-CM

## 2022-03-30 DIAGNOSIS — R296 Repeated falls: Secondary | ICD-10-CM

## 2022-03-30 DIAGNOSIS — I639 Cerebral infarction, unspecified: Secondary | ICD-10-CM

## 2022-03-30 DIAGNOSIS — E559 Vitamin D deficiency, unspecified: Secondary | ICD-10-CM | POA: Diagnosis not present

## 2022-03-30 DIAGNOSIS — I480 Paroxysmal atrial fibrillation: Secondary | ICD-10-CM | POA: Diagnosis not present

## 2022-03-30 DIAGNOSIS — F419 Anxiety disorder, unspecified: Secondary | ICD-10-CM | POA: Diagnosis not present

## 2022-03-30 DIAGNOSIS — E039 Hypothyroidism, unspecified: Secondary | ICD-10-CM

## 2022-03-30 DIAGNOSIS — G479 Sleep disorder, unspecified: Secondary | ICD-10-CM

## 2022-03-30 DIAGNOSIS — R600 Localized edema: Secondary | ICD-10-CM

## 2022-03-30 MED ORDER — CLONAZEPAM 0.5 MG PO TABS: ORAL_TABLET | ORAL | 0 refills | Status: DC

## 2022-03-30 NOTE — Assessment & Plan Note (Signed)
Will continue to monitor and consider need for vascular referral in future For now asymptomatic

## 2022-03-30 NOTE — Assessment & Plan Note (Signed)
Continue with compression as needed  elevate

## 2022-03-30 NOTE — Assessment & Plan Note (Signed)
Continue f/u with pain management as scheduled

## 2022-03-30 NOTE — Assessment & Plan Note (Signed)
pdmp reviewed rx klonipin 1/2 to 1 tablet prn anxiety Use with caution as increased risk for falls D/w pt do not take with pain meds and or other sedative type medication or alcohol

## 2022-03-30 NOTE — Patient Instructions (Signed)
Recommend daily b12 1000 mcg once daily.

## 2022-03-30 NOTE — Assessment & Plan Note (Signed)
Continue baby asa

## 2022-03-30 NOTE — Progress Notes (Signed)
Established Patient Office Visit  Subjective:  Patient ID: Carmen [name], female    DOB: May 28, 1941  Age: 81 y.o. MRN: [id number]  CC:  Chief Complaint  Patient presents with   Transitions Of Care    HPI Carmen [name] is here for a transition of care visit.  Prior provider was: Laverna Peace, FNP Pt is without acute concerns.   Sees pain management Dr. Dr. Dossie Arbour, taking norco vicodin 5-325 mg   chronic concerns:  Anxiety: klonipin 0.5 mg prn anxiety, only when she is overly anxious. Does have increased fall risk history however living with daughter, monitored, and fall risks have been monitored and d/c in home. Pt better since living with daughter and tolerate klonipin well when taking. Only takes a few times a week if even that, when overly anxious.   Sleep disorder: takes trazodone prn when with poor sleep. Tolerates well.   Afib: eliquis 5 mg twice daily as well as baby asa daily. Also takes metoprolol 50 mg for rate control. Sees cardiologist Dr. Saunders Revel.   Hypothyroid: levothyroxine 100 mcg, takes separated from other medications.   PVD: did used to see vascular surgeon prior to moving here, however pedal edema much more controlled since wearing daily compression stockings. Denies pain in leg currently.   Past Medical History:  Diagnosis Date   Abnormal CT scan, lumbar spine (05/11/2021) 05/17/2021   (05/11/2021) LUMBAR CT FINDINGS: Alignment: Lumbar levocurvature, apex L4. Mild lateral listhesis L4 on L5 of approximately 3 mm. Vertebrae: Remote appearing superior endplate deformities at T12 with 10% height loss and L1 with up to 20% height loss. Multilevel discogenic and facet degenerative changes. Mild bilateral SI joint arthrosis.  DISC LEVELS: T11-T12: Near complete disc height loss with de   Abnormal MRI, cervical spine (05/12/2021) 05/17/2021   (05/12/2021) CERVICAL MRI FINDINGS: Motion artifact is present. Posterior Fossa, vertebral arteries, paraspinal tissues: Left  superior cerebellar infarct.   DISC LEVELS: C2-C3: Disc bulge with endplate osteophytes. Uncovertebral and facet hypertrophy. C3-C4: Disc bulge with endplate osteophytes. Uncovertebral and facet hypertrophy. Mild canal stenosis. Marked foraminal stenosis. 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 10/02/2020   Stroke [facility])    Thyroid disease     Past Surgical History:  Procedure Laterality Date   back injections     CARDIAC CATHETERIZATION     CATARACT EXTRACTION     CORONARY ANGIOPLASTY     CORONARY ARTERY BYPASS GRAFT     2005    Family History  Problem Relation Age of Onset   Breast cancer Mother    Heart attack Father    Bipolar disorder Daughter    Diabetes Mellitus II Neg Hx     Social History   Socioeconomic History   Marital status: Widowed    Spouse name: Not on file   Number of children: 3   Years of education: Not on file   Highest education level: Not on file  Occupational History   Not on file  Tobacco Use   Smoking status: Former    Years: 30.00    Types: Cigarettes    Quit date: 05/2020    Years since quitting:  1.9   Smokeless tobacco: Never   Tobacco comments:    Smoked about 1 pack per month  Vaping Use   Vaping Use: Never used  Substance and Sexual Activity   Alcohol use: Not Currently   Drug use: Never   Sexual activity: Not Currently  Other Topics Concern   Not on file  Social History Narrative   ** Merged History Encounter **       Social Determinants of Health   Financial Resource Strain: Low Risk  (07/29/2021)   Overall Financial Resource Strain (CARDIA)    Difficulty of Paying Living Expenses: Not hard at all  Food Insecurity: No Food Insecurity (02/25/2021)   Hunger Vital Sign    Worried  About Running Out of Food in the Last Year: Never true    [place] in the Last Year: Never true  Transportation Needs: No Transportation Needs (02/25/2021)   PRAPARE - Hydrologist (Medical): No    Lack of Transportation (Non-Medical): No  Physical Activity: Not on file  Stress: Not on file  Social Connections: Not on file  Intimate Partner Violence: Not on file    Outpatient Medications Prior to Visit  Medication Sig Dispense Refill   apixaban (ELIQUIS) 5 MG TABS tablet Take 1 tablet (5 mg total) by mouth 2 (two) times daily. 180 tablet 3   aspirin EC 81 MG tablet Take 81 mg by mouth daily. Swallow whole.     atorvastatin (LIPITOR) 40 MG tablet Take 1 tablet (40 mg total) by mouth daily. 90 tablet 1   calcium carbonate (OSCAL) 1500 (600 Ca) MG TABS tablet Take 1 tablet (1,500 mg total) by mouth 2 (two) times daily with a meal. 60 tablet 2   cetirizine (ZYRTEC) 10 MG tablet Take 10 mg by mouth daily.     CHOLECALCIFEROL PO Take 1 capsule by mouth daily.     hydrochlorothiazide (HYDRODIURIL) 25 MG tablet Take 1 tablet (25 mg total) by mouth daily. 90 tablet 3   HYDROcodone-acetaminophen (NORCO/VICODIN) 5-325 MG tablet Take 1 tablet by mouth 2 (two) times daily as needed for severe pain. Must last 30 days. 60 tablet 0   [START ON 03/31/2022] HYDROcodone-acetaminophen (NORCO/VICODIN) 5-325 MG tablet Take 1 tablet by mouth 2 (two) times daily as needed for severe pain. Must last 30 days. 60 tablet 0   levothyroxine (SYNTHROID) 100 MCG tablet Take 1 tablet (100 mcg total) by mouth daily before breakfast. 90 tablet 1   metoprolol succinate (TOPROL-XL) 100 MG 24 hr tablet Take 0.5 tablets (50 mg total) by mouth daily. 45 tablet 3   traZODone (DESYREL) 100 MG tablet TAKE 1 TABLET BY MOUTH AT BEDTIME AS NEEDED FOR SLEEP. 90 tablet 1   clonazePAM (KLONOPIN) 0.5 MG tablet TAKE 1 TABLET BY MOUTH EVERY DAY AS NEEDED FOR ANXIETY 30 tablet 0   moxifloxacin (VIGAMOX) 0.5 %  ophthalmic solution Place 1 drop into the left eye 4 (four) times daily.     HYDROcodone-acetaminophen (NORCO/VICODIN) 5-325 MG tablet Take 1 tablet by mouth 2 (two) times daily as needed for severe pain. Must last 30 days. 60 tablet 0   prednisoLONE acetate (PRED FORTE) 1 % ophthalmic suspension Place 1 drop into the left eye 4 (four) times daily. (Patient not taking: Reported on 03/30/2022)     No facility-administered medications prior to visit.    No Known Allergies      Objective:    Physical Exam Vitals reviewed.  Constitutional:  General: She is not in acute distress.    Appearance: Normal appearance. She is obese. She is not ill-appearing, toxic-appearing or diaphoretic.  HENT:     Mouth/Throat:     Pharynx: No pharyngeal swelling.     Tonsils: No tonsillar exudate.  Neck:     Thyroid: No thyroid mass.  Cardiovascular:     Rate and Rhythm: Normal rate and regular rhythm.     Pulses:          Dorsalis pedis pulses are 2+ on the right side and 1+ on the left side.       Posterior tibial pulses are 2+ on the right side and 1+ on the left side.  Pulmonary:     Effort: Pulmonary effort is normal.     Breath sounds: Normal breath sounds.  Abdominal:     General: Abdomen is flat. Bowel sounds are normal.     Palpations: Abdomen is soft.  Musculoskeletal:        General: Normal range of motion.     Right lower leg: No edema.     Left lower leg: 1+ Edema present.  Lymphadenopathy:     Cervical:     Right cervical: No superficial cervical adenopathy.    Left cervical: No superficial cervical adenopathy.  Skin:    General: Skin is warm.     Capillary Refill: Capillary refill takes less than 2 seconds.  Neurological:     General: No focal deficit present.     Mental Status: She is alert and oriented to person, place, and time.  Psychiatric:        Mood and Affect: Mood normal.        Behavior: Behavior normal.        Thought Content: Thought content normal.         Judgment: Judgment normal.       BP (!) 142/102   Pulse 89   Temp 98.2 F (36.8 C)   Resp (!) 97   Ht 5' 4"  (1.626 m)   Wt 184 lb (83.5 kg)   BMI 31.58 kg/m  Wt Readings from Last 3 Encounters:  03/30/22 184 lb (83.5 kg)  03/17/22 184 lb 9.6 oz (83.7 kg)  01/26/22 187 lb (84.8 kg)     Health Maintenance Due  Topic Date Due   COVID-19 Vaccine (1) Never done   TETANUS/TDAP  Never done   Zoster Vaccines- Shingrix (1 of 2) Never done   DEXA SCAN  Never done    There are no preventive care reminders to display for this patient.  Lab Results  Component Value Date   TSH 2.62 10/18/2021   Lab Results  Component Value Date   WBC 6.0 10/18/2021   HGB 12.2 10/18/2021   HCT 37.7 10/18/2021   MCV 92.0 10/18/2021   PLT 225.0 10/18/2021   Lab Results  Component Value Date   NA 139 01/11/2022   K 4.4 01/11/2022   CO2 24 01/11/2022   GLUCOSE 100 (H) 01/11/2022   BUN 18 01/11/2022   CREATININE 0.83 01/11/2022   BILITOT 0.4 10/18/2021   ALKPHOS 41 10/18/2021   AST 15 10/18/2021   ALT 14 10/18/2021   PROT 6.8 10/18/2021   ALBUMIN 4.2 10/18/2021   CALCIUM 9.6 01/11/2022   ANIONGAP 9 03/14/2021   EGFR 71 01/11/2022   GFR 67.85 10/18/2021   Lab Results  Component Value Date   CHOL 182 07/26/2021   Lab Results  Component Value Date  HDL 50.10 07/26/2021   No results found for: "[place]" Lab Results  Component Value Date   TRIG 292.0 (H) 07/26/2021   Lab Results  Component Value Date   CHOLHDL 4 07/26/2021   Lab Results  Component Value Date   HGBA1C 5.8 (H) 10/08/2020      Assessment & Plan:   Problem List Items Addressed This Visit       Cardiovascular and Mediastinum   CVA (cerebral vascular accident) ([place])    Continue baby asa       Peripheral vascular disease ([place])    Will continue to monitor and consider need for vascular referral in future For now asymptomatic      Paroxysmal atrial fibrillation (HCC)    Cont eliquis 5 mg twice  daily, asa 81 mg as well as metoprolol 50 mg Continue f/u with cardiologist as scheduled         Endocrine   Acquired hypothyroidism    Reviewed labs continue levothyroxine 100 mcg once daily         Other   Chronic pain syndrome (Chronic)    Continue f/u with pain management as scheduled      Relevant Medications   clonazePAM (KLONOPIN) 0.5 MG tablet   Difficulty sleeping    Trazodone prn      Repeated falls    Monitor at home for fall risks  D/w pt increased fall risk with klonipin and pain medication.  Use with caution       Vitamin D deficiency - Primary    Continue daily vitamin D      Anxiety    pdmp reviewed rx klonipin 1/2 to 1 tablet prn anxiety Use with caution as increased risk for falls D/w pt do not take with pain meds and or other sedative type medication or alcohol      Relevant Medications   clonazePAM (KLONOPIN) 0.5 MG tablet   Leg edema    Continue with compression as needed  elevate       Meds ordered this encounter  Medications   clonazePAM (KLONOPIN) 0.5 MG tablet    Sig: TAKE 1 TABLET BY MOUTH EVERY DAY AS NEEDED FOR ANXIETY    Dispense:  30 tablet    Refill:  0    Follow-up: Return in about 6 months (around 09/29/2022) for regular follow up appt .    Eugenia Pancoast, FNP

## 2022-03-30 NOTE — Assessment & Plan Note (Addendum)
Cont eliquis 5 mg twice daily, asa 81 mg as well as metoprolol 50 mg Continue f/u with cardiologist as scheduled

## 2022-03-30 NOTE — Assessment & Plan Note (Signed)
Reviewed labs continue levothyroxine 100 mcg once daily

## 2022-03-30 NOTE — Assessment & Plan Note (Addendum)
Monitor at home for fall risks  D/w pt increased fall risk with klonipin and pain medication.  Use with caution

## 2022-03-30 NOTE — Assessment & Plan Note (Signed)
Continue daily vitamin D 

## 2022-03-30 NOTE — Assessment & Plan Note (Signed)
Trazodone prn 

## 2022-04-14 ENCOUNTER — Ambulatory Visit (INDEPENDENT_AMBULATORY_CARE_PROVIDER_SITE_OTHER): Payer: Medicare Other | Admitting: Family

## 2022-04-14 ENCOUNTER — Encounter: Payer: Self-pay | Admitting: Family

## 2022-04-14 VITALS — BP 140/89 | HR 76 | Temp 98.7°F | Resp 16 | Ht 64.0 in | Wt 188.5 lb

## 2022-04-14 DIAGNOSIS — Z111 Encounter for screening for respiratory tuberculosis: Secondary | ICD-10-CM

## 2022-04-14 DIAGNOSIS — Z9181 History of falling: Secondary | ICD-10-CM

## 2022-04-14 DIAGNOSIS — R7989 Other specified abnormal findings of blood chemistry: Secondary | ICD-10-CM | POA: Diagnosis not present

## 2022-04-14 DIAGNOSIS — G4700 Insomnia, unspecified: Secondary | ICD-10-CM | POA: Diagnosis not present

## 2022-04-14 DIAGNOSIS — R29898 Other symptoms and signs involving the musculoskeletal system: Secondary | ICD-10-CM

## 2022-04-14 LAB — BRAIN NATRIURETIC PEPTIDE: Pro B Natriuretic peptide (BNP): 430 pg/mL — ABNORMAL HIGH (ref 0.0–100.0)

## 2022-04-14 MED ORDER — TRAZODONE HCL 100 MG PO TABS
100.0000 mg | ORAL_TABLET | Freq: Every evening | ORAL | 1 refills | Status: DC | PRN
Start: 2022-04-14 — End: 2022-10-05

## 2022-04-14 NOTE — Patient Instructions (Addendum)
Stop by the lab prior to leaving today. I will notify you of your results once received.   A referral was placed today for physical therapy. Please let us know if you have not heard back within 2 weeks about the referral.   Due to recent changes in healthcare laws, you may see results of your imaging and/or laboratory studies on MyChart before I have had a chance to review them.  I understand that in some cases there may be results that are confusing or concerning to you. Please understand that not all results are received at the same time and often I may need to interpret multiple results in order to provide you with the best plan of care or course of treatment. Therefore, I ask that you please give me 2 business days to thoroughly review all your results before contacting my office for clarification. Should we see a critical lab result, you will be contacted sooner.   It was a pleasure seeing you today! Please do not hesitate to reach out with any questions and or concerns.  Regards,   Mort Sawyers FNP-C

## 2022-04-14 NOTE — Assessment & Plan Note (Signed)
quantiferon ordered pending results  Will complete paperwork once complete   Pt to be good candidate for living facilities without need for assisted living.

## 2022-04-14 NOTE — Assessment & Plan Note (Signed)
Repeat bnp today pending results

## 2022-04-14 NOTE — Progress Notes (Signed)
FYI Dr. Okey Dupre. Pt was not fasting today but will get cholesterol once she is fasting

## 2022-04-14 NOTE — Progress Notes (Signed)
Established Patient Office Visit  Subjective:  Patient ID: Carmen [name], female    DOB: October 09, 1940  Age: 81 y.o. MRN: [id number]  CC:  Chief Complaint  Patient presents with   Form Completion    And see about getting Physical therapy.    HPI Carmen [name] is here today for follow up.   Bil lower leg weakness, at times especially when walking. Otherwise doing ok. She does use a rollator and walker at home when she feels most weak. She has a shower chair, grab bars in shower, and grips in her shower and is able to shower on her own.   At her current center she is given one meal a day, and she is able to provide herself with breakfast and dinner. She can lightly clean around the home.   Past Medical History:  Diagnosis Date   Abnormal CT scan, lumbar spine (05/11/2021) 05/17/2021   (05/11/2021) LUMBAR CT FINDINGS: Alignment: Lumbar levocurvature, apex L4. Mild lateral listhesis L4 on L5 of approximately 3 mm. Vertebrae: Remote appearing superior endplate deformities at T12 with 10% height loss and L1 with up to 20% height loss. Multilevel discogenic and facet degenerative changes. Mild bilateral SI joint arthrosis.  DISC LEVELS: T11-T12: Near complete disc height loss with de   Abnormal MRI, cervical spine (05/12/2021) 05/17/2021   (05/12/2021) CERVICAL MRI FINDINGS: Motion artifact is present. Posterior Fossa, vertebral arteries, paraspinal tissues: Left superior cerebellar infarct.   DISC LEVELS: C2-C3: Disc bulge with endplate osteophytes. Uncovertebral and facet hypertrophy. C3-C4: Disc bulge with endplate osteophytes. Uncovertebral and facet hypertrophy. Mild canal stenosis. Marked foraminal stenosis. 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  10/02/2020   Stroke [facility])    Thyroid disease     Past Surgical History:  Procedure Laterality Date   back injections     CARDIAC CATHETERIZATION     CATARACT EXTRACTION     CORONARY ANGIOPLASTY     CORONARY ARTERY BYPASS GRAFT     2005    Family History  Problem Relation Age of Onset   Breast cancer Mother    Heart attack Father    Bipolar disorder Daughter    Diabetes Mellitus II Neg Hx     Social History   Socioeconomic History   Marital status: Widowed    Spouse name: Not on file   Number of children: 3   Years of education: Not on file   Highest education level: Not on file  Occupational History   Not on file  Tobacco Use   Smoking status: Former    Years: 30.00    Types: Cigarettes    Quit date: 05/2020    Years since quitting: 1.9   Smokeless tobacco: Never   Tobacco comments:    Smoked about 1 pack per month  Vaping Use   Vaping Use: Never used  Substance and Sexual Activity   Alcohol use: Not Currently   Drug use: Never   Sexual activity: Not Currently  Other Topics Concern   Not on file  Social History Narrative   ** Merged History Encounter **       Social Determinants of Health  Financial Resource Strain: Low Risk  (07/29/2021)   Overall Financial Resource Strain (CARDIA)    Difficulty of Paying Living Expenses: Not hard at all  Food Insecurity: No Food Insecurity (02/25/2021)   Hunger Vital Sign    Worried About Running Out of Food in the Last Year: Never true    Ran Out of Food in the Last Year: Never true  Transportation Needs: No Transportation Needs (02/25/2021)   PRAPARE - Hydrologist (Medical): No    Lack of Transportation (Non-Medical): No  Physical Activity: Not on file  Stress: Not on file  Social Connections: Not on file  Intimate Partner Violence: Not on file    Outpatient Medications Prior to Visit  Medication Sig Dispense Refill   apixaban (ELIQUIS) 5 MG TABS tablet Take 1 tablet (5 mg total)  by mouth 2 (two) times daily. 180 tablet 3   aspirin EC 81 MG tablet Take 81 mg by mouth daily. Swallow whole.     atorvastatin (LIPITOR) 40 MG tablet Take 1 tablet (40 mg total) by mouth daily. 90 tablet 1   calcium carbonate (OSCAL) 1500 (600 Ca) MG TABS tablet Take 1 tablet (1,500 mg total) by mouth 2 (two) times daily with a meal. 60 tablet 2   cetirizine (ZYRTEC) 10 MG tablet Take 10 mg by mouth daily.     CHOLECALCIFEROL PO Take 1 capsule by mouth daily.     clonazePAM (KLONOPIN) 0.5 MG tablet TAKE 1 TABLET BY MOUTH EVERY DAY AS NEEDED FOR ANXIETY 30 tablet 0   hydrochlorothiazide (HYDRODIURIL) 25 MG tablet Take 1 tablet (25 mg total) by mouth daily. 90 tablet 3   levothyroxine (SYNTHROID) 100 MCG tablet Take 1 tablet (100 mcg total) by mouth daily before breakfast. 90 tablet 1   metoprolol succinate (TOPROL-XL) 100 MG 24 hr tablet Take 0.5 tablets (50 mg total) by mouth daily. 45 tablet 3   HYDROcodone-acetaminophen (NORCO/VICODIN) 5-325 MG tablet Take 1 tablet by mouth 2 (two) times daily as needed for severe pain. Must last 30 days. 60 tablet 0   traZODone (DESYREL) 100 MG tablet TAKE 1 TABLET BY MOUTH AT BEDTIME AS NEEDED FOR SLEEP. 90 tablet 1   HYDROcodone-acetaminophen (NORCO/VICODIN) 5-325 MG tablet Take 1 tablet by mouth 2 (two) times daily as needed for severe pain. Must last 30 days. 60 tablet 0   HYDROcodone-acetaminophen (NORCO/VICODIN) 5-325 MG tablet Take 1 tablet by mouth 2 (two) times daily as needed for severe pain. Must last 30 days. 60 tablet 0   No facility-administered medications prior to visit.    No Known Allergies       Objective:    Physical Exam Constitutional:      General: She is not in acute distress.    Appearance: Normal appearance. She is obese. She is not ill-appearing, toxic-appearing or diaphoretic.  Cardiovascular:     Rate and Rhythm: Normal rate and regular rhythm.  Pulmonary:     Effort: Pulmonary effort is normal.     Breath sounds:  Normal breath sounds.  Abdominal:     General: Abdomen is flat.  Musculoskeletal:        General: Normal range of motion.     Comments: Pt walking heel to toe, as long as going slowly, does well  Skin:    General: Skin is warm.  Neurological:     General: No focal deficit present.     Mental Status: She is alert and oriented to person, place,  and time.     Motor: Weakness (slight bil le weakness upon standing then when gets bearings alot stronger) present.     Gait: Gait is intact. Gait normal.  Psychiatric:        Mood and Affect: Mood normal.        Behavior: Behavior normal.        Thought Content: Thought content normal.        Judgment: Judgment normal.       BP 140/89   Pulse 76   Temp 98.7 F (37.1 C)   Resp 16   Ht 5' 4"  (1.626 m)   Wt 188 lb 8 oz (85.5 kg)   SpO2 98%   BMI 32.36 kg/m  Wt Readings from Last 3 Encounters:  04/17/22 189 lb (85.7 kg)  04/14/22 188 lb 8 oz (85.5 kg)  03/30/22 184 lb (83.5 kg)     Health Maintenance Due  Topic Date Due   COVID-19 Vaccine (1) Never done   TETANUS/TDAP  Never done   Zoster Vaccines- Shingrix (1 of 2) Never done   DEXA SCAN  Never done    There are no preventive care reminders to display for this patient.  Lab Results  Component Value Date   TSH 2.62 10/18/2021   Lab Results  Component Value Date   WBC 6.0 10/18/2021   HGB 12.2 10/18/2021   HCT 37.7 10/18/2021   MCV 92.0 10/18/2021   PLT 225.0 10/18/2021   Lab Results  Component Value Date   NA 139 01/11/2022   K 4.4 01/11/2022   CO2 24 01/11/2022   GLUCOSE 100 (H) 01/11/2022   BUN 18 01/11/2022   CREATININE 0.83 01/11/2022   BILITOT 0.4 10/18/2021   ALKPHOS 41 10/18/2021   AST 15 10/18/2021   ALT 14 10/18/2021   PROT 6.8 10/18/2021   ALBUMIN 4.2 10/18/2021   CALCIUM 9.6 01/11/2022   ANIONGAP 9 03/14/2021   EGFR 71 01/11/2022   GFR 67.85 10/18/2021   Lab Results  Component Value Date   CHOL 182 07/26/2021   Lab Results  Component  Value Date   HDL 50.10 07/26/2021   No results found for: "LDLCALC" Lab Results  Component Value Date   TRIG 292.0 (H) 07/26/2021   Lab Results  Component Value Date   CHOLHDL 4 07/26/2021   Lab Results  Component Value Date   HGBA1C 5.8 (H) 10/08/2020      Assessment & Plan:   Problem List Items Addressed This Visit       Other   At risk for falls    With some bil lower extremity weakness however with fall risks minimal at home, and fall risk safety measures in place. Pt to continue using walker when feeling weak.        Screening for tuberculosis - Primary    quantiferon ordered pending results  Will complete paperwork once complete   Pt to be good candidate for living facilities without need for assisted living.      Relevant Orders   QuantiFERON-TB Gold Plus (Completed)   Weakness of both lower extremities    Utilize walker  Referral to PT for eval/treat Work on muscle strengthening exercises      Relevant Orders   Ambulatory referral to Physical Therapy   Insomnia    Refill trazodone 100 mg Rise slowly from standing when taking.  Work on sleep hygiene      Relevant Medications   traZODone (DESYREL) 100 MG tablet  Elevated brain natriuretic peptide (BNP) level    Repeat bnp today pending results      Relevant Orders   Brain natriuretic peptide (Completed)    Meds ordered this encounter  Medications   traZODone (DESYREL) 100 MG tablet    Sig: Take 1 tablet (100 mg total) by mouth at bedtime as needed for sleep. for sleep    Dispense:  90 tablet    Refill:  1    Order Specific Question:   Supervising Provider    Answer:   BEDSOLE, AMY E [[id number]    Follow-up: No follow-ups on file.    Eugenia Pancoast, FNP

## 2022-04-14 NOTE — Assessment & Plan Note (Signed)
With some bil lower extremity weakness however with fall risks minimal at home, and fall risk safety measures in place. Pt to continue using walker when feeling weak.

## 2022-04-14 NOTE — Assessment & Plan Note (Signed)
Utilize walker  Referral to PT for eval/treat Work on muscle strengthening exercises

## 2022-04-14 NOTE — Assessment & Plan Note (Signed)
Refill trazodone 100 mg Rise slowly from standing when taking.  Work on sleep hygiene

## 2022-04-16 NOTE — Progress Notes (Unsigned)
PROVIDER NOTE: Information contained herein reflects review and annotations entered in association with encounter. Interpretation of such information and data should be left to medically-trained personnel. Information provided to patient can be located elsewhere in the medical record under "Patient Instructions". Document created using STT-dictation technology, any transcriptional errors that may result from process are unintentional.    Patient: Carmen [name]  Service Category: E/M  Provider:  A , MD  DOB: 12/25/1940  DOS: 04/17/2022  Specialty: Interventional Pain Management  MRN: [id number]  Setting: Ambulatory outpatient  PCP: Dugal, Tabitha, FNP  Type: Established Patient    Referring Provider: [name], Michelle S, F*  Location: Office  Delivery: Face-to-face     HPI  [name], a 81 y.o. year old female, is here today because of her No primary diagnosis found.. [name]'s primary complain today is No chief complaint on file. Last encounter: My last encounter with her was on 02/09/2022. Pertinent problems: [name] has Peripheral vascular disease (HCC); Other chronic pain; Chronic low back pain (1ry area of Pain) (Bilateral) (R>L) w/o sciatica; Dorsalgia, unspecified; Repeated falls; Chronic pain syndrome; Cervicalgia; Chronic neck pain (2ry area of Pain) (Bilateral) (L>R); Chronic lower extremity pain (3ry area of Pain) (Bilateral) (L>R); Pain in both lower extremities; Chronic hand pain (Left); Chronic hand pain (Right); Lumbar compression fracture (Old) (Multilevel), sequela; Chronic cervical radiculopathy (Bilateral); DDD (degenerative disc disease), cervical; Foraminal stenosis of cervical region; Lumbar facet syndrome (Bilateral); Lumbosacral facet hypertrophy (Multilevel) (Bilateral); Lumbar facet arthropathy (Multilevel) (Bilateral); 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HCC); Cervical facet syndrome (Bilateral); Cervical facet hypertrophy (Multilevel)  (Bilateral); Spondylosis without myelopathy or radiculopathy, cervical region; Spondylosis without myelopathy or radiculopathy, lumbosacral region; Other intervertebral disc degeneration, lumbar region; DDD (degenerative disc disease), lumbosacral; Leg edema; Levoscoliosis of lumbar spine; Lumbosacral foraminal stenosis (Multilevel) (Bilateral); and Degenerative lateral spondylolisthesis of L4/L5 (3mm) on their pertinent problem list. Pain Assessment: Severity of   is reported as a  /10. Location:    / . Onset:  . Quality:  . Timing:  . Modifying factor(s):  . Vitals:  vitals were not taken for this visit.   Reason for encounter: medication management. ***  Pharmacotherapy Assessment  Analgesic: Hydrocodone/APAP 5/325, 1 tab p.o. BID (10 mg/day of hydrocodone) (10 MME) (last filled on 05/02/2021) MME/day: 10 mg/day   Monitoring: [place] PMP: PDMP reviewed during this encounter.       Pharmacotherapy: No side-effects or adverse reactions reported. Compliance: No problems identified. Effectiveness: Clinically acceptable.  No notes on file  UDS:  Summary  Date Value Ref Range Status  03/14/2021 Note  Final    Comment:    ==================================================================== Compliance Drug Analysis, Ur ==================================================================== Test                             Result       Flag       Units  Drug Present and Declared for Prescription Verification   Hydrocodone                    900          EXPECTED   ng/mg creat   Hydromorphone                  196          EXPECTED   ng/mg creat   Dihydrocodeine                   166          EXPECTED   ng/mg creat   Norhydrocodone                 1484         EXPECTED   ng/mg creat    Sources of hydrocodone include scheduled prescription medications.    Hydromorphone, dihydrocodeine and norhydrocodone are expected    metabolites of hydrocodone. Hydromorphone and dihydrocodeine are    also available as  scheduled prescription medications.    Trazodone                      PRESENT      EXPECTED   1,3 chlorophenyl piperazine    PRESENT      EXPECTED    1,3-chlorophenyl piperazine is an expected metabolite of trazodone.    Acetaminophen                  PRESENT      EXPECTED   Metoprolol                     PRESENT      EXPECTED  Drug Present not Declared for Prescription Verification   Ephedrine/Pseudoephedrine      PRESENT      UNEXPECTED   Phenylpropanolamine            PRESENT      UNEXPECTED    Source of ephedrine/pseudoephedrine is most commonly pseudoephedrine    in over-the-counter or prescription cold and allergy medications.    Phenylpropanolamine is an expected metabolite of    ephedrine/pseudoephedrine.    Guaifenesin                    PRESENT      UNEXPECTED    Guaifenesin may be administered as an over-the-counter or    prescription drug; it may also be present as a breakdown product of    methocarbamol.  Drug Absent but Declared for Prescription Verification   Clonazepam                     Not Detected UNEXPECTED ng/mg creat   Salicylate                     Not Detected UNEXPECTED    Aspirin, as indicated in the declared medication list, is not always    detected even when used as directed.  ==================================================================== Test                      Result    Flag   Units      Ref Range   Creatinine              56               mg/dL      >=20 ==================================================================== Declared Medications:  The flagging and interpretation on this report are based on the  following declared medications.  Unexpected results may arise from  inaccuracies in the declared medications.   **Note: The testing scope of this panel includes these medications:   Clonazepam (Klonopin)  Hydrocodone (Norco)  Metoprolol (Toprol)  Trazodone (Desyrel)   **Note: The testing scope of this panel does not include  small to  moderate amounts of these reported medications:   Acetaminophen (Norco)  Aspirin   **Note: The testing scope of this panel does not include the  following reported medications:   Amlodipine (Lotrel)  Apixaban (Eliquis)  Atorvastatin (Lipitor)  Benazepril (Lotrel)  Cyanocobalamin  Fluconazole (Diflucan)  Levothyroxine (Synthroid)  Nystatin  Triamcinolone ==================================================================== For clinical consultation, please call [phone]. ====================================================================      ROS  Constitutional: Denies any fever or chills Gastrointestinal: No reported hemesis, hematochezia, vomiting, or acute GI distress Musculoskeletal: Denies any acute onset joint swelling, redness, loss of ROM, or weakness Neurological: No reported episodes of acute onset apraxia, aphasia, dysarthria, agnosia, amnesia, paralysis, loss of coordination, or loss of consciousness  Medication Review  Cholecalciferol, HYDROcodone-acetaminophen, apixaban, aspirin EC, atorvastatin, calcium carbonate, cetirizine, clonazePAM, hydrochlorothiazide, levothyroxine, metoprolol succinate, and traZODone  History Review  Allergy: [name] has No Known Allergies. Drug: [name]  reports no history of drug use. Alcohol:  reports that she does not currently use alcohol. Tobacco:  reports that she quit smoking about 1 years ago. Her smoking use included cigarettes. She has never used smokeless tobacco. Social: [name]  reports that she quit smoking about 1 years ago. Her smoking use included cigarettes. She has never used smokeless tobacco. She reports that she does not currently use alcohol. She reports that she does not use drugs. Medical:  has a past medical history of Abnormal CT scan, lumbar spine (05/11/2021) (05/17/2021), Abnormal MRI, cervical spine (05/12/2021) (05/17/2021), Allergy, Anemia, Anxiety, Arthritis, Back pain, Coronary artery  disease, Depression, Hypertension, Peripheral vascular disease ([place]), Personal history of nicotine dependence (10/02/2020), Prsnl hx of TIA (TIA), and cereb infrc w/o resid deficits (10/02/2020), Stroke [facility]), and Thyroid disease. Surgical: [name]  has a past surgical history that includes Cardiac catheterization; Coronary angioplasty; Coronary artery bypass graft; back injections; and Cataract extraction. Family: family history includes Bipolar disorder in her daughter; Breast cancer in her mother; Heart attack in her father.  Laboratory Chemistry Profile   Renal Lab Results  Component Value Date   BUN 18 01/11/2022   CREATININE 0.83 01/11/2022   BCR 22 01/11/2022   GFR 67.85 10/18/2021   GFRAA 73 11/22/2020   GFRNONAA >60 03/14/2021    Hepatic Lab Results  Component Value Date   AST 15 10/18/2021   ALT 14 10/18/2021   ALBUMIN 4.2 10/18/2021   ALKPHOS 41 10/18/2021   AMMONIA 19 10/06/2020    Electrolytes Lab Results  Component Value Date   NA 139 01/11/2022   K 4.4 01/11/2022   CL 100 01/11/2022   CALCIUM 9.6 01/11/2022   MG 1.6 10/18/2021    Bone Lab Results  Component Value Date   25OHVITD1 12 (L) 03/14/2021   25OHVITD2 <1.0 03/14/2021   25OHVITD3 12 03/14/2021    Inflammation (CRP: Acute Phase) (ESR: Chronic Phase) Lab Results  Component Value Date   CRP 0.8 03/14/2021   ESRSEDRATE 4 03/14/2021         Note: Above Lab results reviewed.  Recent Imaging Review  DG PAIN CLINIC C-ARM 1-60 MIN NO REPORT Fluoro was used, but no Radiologist interpretation will be provided.  Please refer to "NOTES" tab for provider progress note. Note: Reviewed        Physical Exam  General appearance: Well nourished, well developed, and well hydrated. In no apparent acute distress Mental status: Alert, oriented x 3 (person, place, & time)       Respiratory: No evidence of acute respiratory distress Eyes: PERLA Vitals: There were no vitals taken for this visit. BMI: Estimated  body mass index is 32.36 kg/m as calculated from the following:   Height as of  04/14/22: 5' 4" (1.626 m).   Weight as of 04/14/22: 188 lb 8 oz (85.5 kg). Ideal: Ideal body weight: 54.7 kg (120 lb 9.5 oz) Adjusted ideal body weight: 67 kg (147 lb 12.1 oz)  Assessment   Diagnosis Status  1. Chronic neck pain (2ry area of Pain) (Bilateral) (L>R)   2. Chronic lower extremity pain (3ry area of Pain) (Bilateral) (L>R)   3. Chronic pain syndrome   4. Chronic low back pain (1ry area of Pain) (Bilateral) (L>R) w/o sciatica   5. Pharmacologic therapy   6. Chronic use of opiate for therapeutic purpose   7. Encounter for medication management   8. Lumbar facet joint syndrome (Bilateral)   9. Encounter for chronic pain management    Controlled Controlled Controlled   Updated Problems: No problems updated.  Plan of Care  Problem-specific:  No problem-specific Assessment & Plan notes found for this encounter.  [name] has a current medication list which includes the following long-term medication(s): apixaban, atorvastatin, calcium carbonate, cetirizine, clonazepam, hydrochlorothiazide, hydrocodone-acetaminophen, levothyroxine, metoprolol succinate, and trazodone.  Pharmacotherapy (Medications Ordered): No orders of the defined types were placed in this encounter.  Orders:  No orders of the defined types were placed in this encounter.  Follow-up plan:   No follow-ups on file.     Interventional Therapies  Risk  Complexity Considerations:   Estimated body mass index is 34.2 kg/m as calculated from the following:   Height as of this encounter: 5' 1" (1.549 m).   Weight as of this encounter: 181 lb (82.1 kg). ELIQUIS + ASA Anticoagulation (Stop: 3 days  Restart: 6 hours)   Planned  Pending:   Diagnostic bilateral lumbar facet MBB #3    Under consideration:   Pending results of CT and MRI  Diagnostic/therapeutic cervical ESI  Therapeutic bilateral lumbar facet RFA #1     Completed:   Diagnostic bilateral lumbar facet MBB #1 (05/31/2021) (100/100/50/50)  Diagnostic bilateral lumbar facet MBB #2 (11/03/2021) (100/100/90/90)  Diagnostic left cervical facet MBB #1 (06/28/2021) (25/25/0/0)    Therapeutic  Palliative (PRN) options:   None established     Recent Visits Date Type Provider Dept  02/09/22 Office Visit , , MD [facility]  01/26/22 Procedure visit , , MD [facility]  Showing recent visits within past 90 days and meeting all other requirements Future Appointments Date Type Provider Dept  04/17/22 Appointment , , MD [facility]  Showing future appointments within next 90 days and meeting all other requirements  I discussed the assessment and treatment plan with the patient. The patient was provided an opportunity to ask questions and all were answered. The patient agreed with the plan and demonstrated an understanding of the instructions.  Patient advised to call back or seek an in-person evaluation if the symptoms or condition worsens.  Duration of encounter: *** minutes.  Total time on encounter, as per AMA guidelines included both the face-to-face and non-face-to-face time personally spent by the physician and/or other qualified health care professional(s) on the day of the encounter (includes time in activities that require the physician or other qualified health care professional and does not include time in activities normally performed by clinical staff). Physician's time may include the following activities when performed: preparing to see the patient (eg, review of tests, pre-charting review of records) obtaining and/or reviewing separately obtained history performing a medically appropriate examination and/or evaluation counseling and educating the patient/family/caregiver ordering medications, tests, or procedures referring and communicating   with other  health care professionals (when not separately reported) documenting clinical information in the electronic or other health record independently interpreting results (not separately reported) and communicating results to the patient/ family/caregiver care coordination (not separately reported)  Note by: Gaspar Cola, MD Date: 04/17/2022; Time: 3:20 PM

## 2022-04-17 ENCOUNTER — Encounter: Payer: Self-pay | Admitting: Pain Medicine

## 2022-04-17 ENCOUNTER — Telehealth: Payer: Self-pay | Admitting: *Deleted

## 2022-04-17 ENCOUNTER — Ambulatory Visit: Payer: Medicare Other | Attending: Pain Medicine | Admitting: Pain Medicine

## 2022-04-17 VITALS — BP 133/87 | HR 78 | Temp 96.6°F | Resp 16 | Ht 61.0 in | Wt 189.0 lb

## 2022-04-17 DIAGNOSIS — M79605 Pain in left leg: Secondary | ICD-10-CM | POA: Insufficient documentation

## 2022-04-17 DIAGNOSIS — M545 Low back pain, unspecified: Secondary | ICD-10-CM | POA: Insufficient documentation

## 2022-04-17 DIAGNOSIS — M5137 Other intervertebral disc degeneration, lumbosacral region: Secondary | ICD-10-CM | POA: Diagnosis not present

## 2022-04-17 DIAGNOSIS — M47816 Spondylosis without myelopathy or radiculopathy, lumbar region: Secondary | ICD-10-CM | POA: Diagnosis not present

## 2022-04-17 DIAGNOSIS — M542 Cervicalgia: Secondary | ICD-10-CM | POA: Insufficient documentation

## 2022-04-17 DIAGNOSIS — M4310 Spondylolisthesis, site unspecified: Secondary | ICD-10-CM | POA: Diagnosis not present

## 2022-04-17 DIAGNOSIS — Z79891 Long term (current) use of opiate analgesic: Secondary | ICD-10-CM | POA: Diagnosis not present

## 2022-04-17 DIAGNOSIS — M79604 Pain in right leg: Secondary | ICD-10-CM | POA: Diagnosis not present

## 2022-04-17 DIAGNOSIS — G894 Chronic pain syndrome: Secondary | ICD-10-CM | POA: Diagnosis not present

## 2022-04-17 DIAGNOSIS — Z7901 Long term (current) use of anticoagulants: Secondary | ICD-10-CM | POA: Insufficient documentation

## 2022-04-17 DIAGNOSIS — G8929 Other chronic pain: Secondary | ICD-10-CM | POA: Diagnosis not present

## 2022-04-17 DIAGNOSIS — Z79899 Other long term (current) drug therapy: Secondary | ICD-10-CM | POA: Insufficient documentation

## 2022-04-17 DIAGNOSIS — M47817 Spondylosis without myelopathy or radiculopathy, lumbosacral region: Secondary | ICD-10-CM | POA: Insufficient documentation

## 2022-04-17 LAB — QUANTIFERON-TB GOLD PLUS
Mitogen-NIL: >10 IU/mL
NIL: 0.09 IU/mL
QuantiFERON-TB Gold Plus: NEGATIVE
TB1-NIL: 0.03 IU/mL
TB2-NIL: 0.04 IU/mL

## 2022-04-17 MED ORDER — HYDROCODONE-ACETAMINOPHEN 5-325 MG PO TABS: 1.0000 | ORAL_TABLET | Freq: Two times a day (BID) | ORAL | 0 refills | Status: DC | PRN

## 2022-04-17 NOTE — Telephone Encounter (Signed)
Patient left before providing UDS. I called her, spoke with daughter, will return tomorrow, 04-18-22, for UDS.

## 2022-04-17 NOTE — Patient Instructions (Signed)
____________________________________________________________________________________________  Blood Thinners  IMPORTANT NOTICE:  If you take any of these, make sure to notify the nursing staff.  Failure to do so may result in injury.  Recommended time intervals to stop and restart blood-thinners, before & after invasive procedures  Generic Name Brand Name Pre-procedure. Stop this long before procedure. Post-procedure. Minimum waiting period before restarting.  Abciximab Reopro 15 days 2 hrs  Alteplase Activase 10 days 10 days  Anagrelide Agrylin    Apixaban Eliquis 3 days 6 hrs  Cilostazol Pletal 3 days 5 hrs  Clopidogrel Plavix 7-10 days 2 hrs  Dabigatran Pradaxa 5 days 6 hrs  Dalteparin Fragmin 24 hours 4 hrs  Dipyridamole Aggrenox 11days 2 hrs  Edoxaban Lixiana; Savaysa 3 days 2 hrs  Enoxaparin  Lovenox 24 hours 4 hrs  Eptifibatide Integrillin 8 hours 2 hrs  Fondaparinux  Arixtra 72 hours 12 hrs  Hydroxychloroquine Plaquenil 11 days   Prasugrel Effient 7-10 days 6 hrs  Reteplase Retavase 10 days 10 days  Rivaroxaban Xarelto 3 days 6 hrs  Ticagrelor Brilinta 5-7 days 6 hrs  Ticlopidine Ticlid 10-14 days 2 hrs  Tinzaparin Innohep 24 hours 4 hrs  Tirofiban Aggrastat 8 hours 2 hrs  Warfarin Coumadin 5 days 2 hrs   Other medications with blood-thinning effects  Product indications Generic (Brand) names Note  Cholesterol Lipitor Stop 4 days before procedure  Blood thinner (injectable) Heparin (LMW or LMWH Heparin) Stop 24 hours before procedure  Cancer Ibrutinib (Imbruvica) Stop 7 days before procedure  Malaria/Rheumatoid Hydroxychloroquine (Plaquenil) Stop 11 days before procedure  Thrombolytics  10 days before or after procedures   Over-the-counter (OTC) Products with blood-thinning effects  Product Common names Stop Time  Aspirin > 325 mg Goody Powders, Excedrin, etc. 11 days  Aspirin ? 81 mg  7 days  Fish oil  4 days  Garlic supplements  7 days  Ginkgo biloba  36  hours  Ginseng  24 hours  NSAIDs Ibuprofen, Naprosyn, etc. 3 days  Vitamin E  4 days   ____________________________________________________________________________________________ ______________________________________________________________________  Preparing for Procedure with Sedation  NOTICE: Due to recent regulatory changes, starting on May 02, 2021, procedures requiring intravenous (IV) sedation will no longer be performed at the [facility].  These types of procedures are required to be performed at [facility] ambulatory surgery facility.  We are very sorry for the inconvenience.  Procedure appointments are limited to planned procedures: No Prescription Refills. No disability issues will be discussed. No medication changes will be discussed.  Instructions: Oral Intake: Do not eat or drink anything for at least 8 hours prior to your procedure. (Exception: Blood Pressure Medication. See below.) Transportation: A driver is required. You may not drive yourself after the procedure. Blood Pressure Medicine: Do not forget to take your blood pressure medicine with a sip of water the morning of the procedure. If your Diastolic (lower reading) is above 100 mmHg, elective cases will be cancelled/rescheduled. Blood thinners: These will need to be stopped for procedures. Notify our staff if you are taking any blood thinners. Depending on which one you take, there will be specific instructions on how and when to stop it. Diabetics on insulin: Notify the staff so that you can be scheduled 1st case in the morning. If your diabetes requires high dose insulin, take only  of your normal insulin dose the morning of the procedure and notify the staff that you have done so. Preventing infections: Shower with an antibacterial soap the morning of your procedure.  Build-up your immune system: Take 1000 mg of Vitamin C with every meal (3 times a day) the day prior to your procedure. Antibiotics: Inform  the staff if you have a condition or reason that requires you to take antibiotics before dental procedures. Pregnancy: If you are pregnant, call and cancel the procedure. Sickness: If you have a cold, fever, or any active infections, call and cancel the procedure. Arrival: You must be in the facility at least 30 minutes prior to your scheduled procedure. Children: Do not bring children with you. Dress appropriately: There is always the possibility that your clothing may get soiled. Valuables: Do not bring any jewelry or valuables.  Reasons to call and reschedule or cancel your procedure: (Following these recommendations will minimize the risk of a serious complication.) Surgeries: Avoid having procedures within 2 weeks of any surgery. (Avoid for 2 weeks before or after any surgery). Flu Shots: Avoid having procedures within 2 weeks of a flu shots. (Avoid for 2 weeks before or after immunizations). Barium: Avoid having a procedure within 7-10 days after having had a radiological study involving the use of radiological contrast. (Myelograms, Barium swallow or enema study). Heart attacks: Avoid any elective procedures or surgeries for the initial 6 months after a "Myocardial Infarction" (Heart Attack). Blood thinners: It is imperative that you stop these medications before procedures. Let us know if you if you take any blood thinner.  Infection: Avoid procedures during or within two weeks of an infection (including chest colds or gastrointestinal problems). Symptoms associated with infections include: Localized redness, fever, chills, night sweats or profuse sweating, burning sensation when voiding, cough, congestion, stuffiness, runny nose, sore throat, diarrhea, nausea, vomiting, cold or Flu symptoms, recent or current infections. It is specially important if the infection is over the area that we intend to treat. Heart and lung problems: Symptoms that may suggest an active cardiopulmonary problem  include: cough, chest pain, breathing difficulties or shortness of breath, dizziness, ankle swelling, uncontrolled high or unusually low blood pressure, and/or palpitations. If you are experiencing any of these symptoms, cancel your procedure and contact your primary care physician for an evaluation.  Remember:  Regular Business hours are:  Monday to Thursday 8:00 AM to 4:00 PM  Provider's Schedule: Delano Metz, MD:  Procedure days: Tuesday and Thursday 7:30 AM to 4:00 PM  Edward Jolly, MD:  Procedure days: Monday and Wednesday 7:30 AM to 4:00 PM ______________________________________________________________________  ____________________________________________________________________________________________  General Risks and Possible Complications  Patient Responsibilities: It is important that you read this as it is part of your informed consent. It is our duty to inform you of the risks and possible complications associated with treatments offered to you. It is your responsibility as a patient to read this and to ask questions about anything that is not clear or that you believe was not covered in this document.  Patient's Rights: You have the right to refuse treatment. You also have the right to change your mind, even after initially having agreed to have the treatment done. However, under this last option, if you wait until the last second to change your mind, you may be charged for the materials used up to that point.  Introduction: Medicine is not an Visual merchandiser. Everything in Medicine, including the lack of treatment(s), carries the potential for danger, harm, or loss (which is by definition: Risk). In Medicine, a complication is a secondary problem, condition, or disease that can aggravate an already existing one. All treatments carry the risk  of possible complications. The fact that a side effects or complications occurs, does not imply that the treatment was conducted  incorrectly. It must be clearly understood that these can happen even when everything is done following the highest safety standards.  No treatment: You can choose not to proceed with the proposed treatment alternative. The "PRO(s)" would include: avoiding the risk of complications associated with the therapy. The "CON(s)" would include: not getting any of the treatment benefits. These benefits fall under one of three categories: diagnostic; therapeutic; and/or palliative. Diagnostic benefits include: getting information which can ultimately lead to improvement of the disease or symptom(s). Therapeutic benefits are those associated with the successful treatment of the disease. Finally, palliative benefits are those related to the decrease of the primary symptoms, without necessarily curing the condition (example: decreasing the pain from a flare-up of a chronic condition, such as incurable terminal cancer).  General Risks and Complications: These are associated to most interventional treatments. They can occur alone, or in combination. They fall under one of the following six (6) categories: no benefit or worsening of symptoms; bleeding; infection; nerve damage; allergic reactions; and/or death. No benefits or worsening of symptoms: In Medicine there are no guarantees, only probabilities. No healthcare provider can ever guarantee that a medical treatment will work, they can only state the probability that it may. Furthermore, there is always the possibility that the condition may worsen, either directly, or indirectly, as a consequence of the treatment. Bleeding: This is more common if the patient is taking a blood thinner, either prescription or over the counter (example: Goody Powders, Fish oil, Aspirin, Garlic, etc.), or if suffering a condition associated with impaired coagulation (example: Hemophilia, cirrhosis of the liver, low platelet counts, etc.). However, even if you do not have one on these, it can  still happen. If you have any of these conditions, or take one of these drugs, make sure to notify your treating physician. Infection: This is more common in patients with a compromised immune system, either due to disease (example: diabetes, cancer, human immunodeficiency virus [HIV], etc.), or due to medications or treatments (example: therapies used to treat cancer and rheumatological diseases). However, even if you do not have one on these, it can still happen. If you have any of these conditions, or take one of these drugs, make sure to notify your treating physician. Nerve Damage: This is more common when the treatment is an invasive one, but it can also happen with the use of medications, such as those used in the treatment of cancer. The damage can occur to small secondary nerves, or to large primary ones, such as those in the spinal cord and brain. This damage may be temporary or permanent and it may lead to impairments that can range from temporary numbness to permanent paralysis and/or brain death. Allergic Reactions: Any time a substance or material comes in contact with our body, there is the possibility of an allergic reaction. These can range from a mild skin rash (contact dermatitis) to a severe systemic reaction (anaphylactic reaction), which can result in death. Death: In general, any medical intervention can result in death, most of the time due to an unforeseen complication. ____________________________________________________________________________________________ ____________________________________________________________________________________________  Pharmacy Shortages of Pain Medication   Introduction Shockingly as it may seem, .  "No U.S. Supreme Court decision has ever interpreted the Constitution as guaranteeing a right to health care for all Americans." - OpeningJokes.ch  "With respect to human rights,  the [place] has no  formally codified right to health, nor does it participate in a human rights treaty that specifies a right to health." - Scott J. Schweikart, JD, MBE  Situation By now, most of our patients have had the experience of being told by their pharmacist that they do not have enough medication to cover their prescription. If you have not had this experience, just know that you soon will.  Problem There appears to be a shortage of these medications, either at the national level or locally. This is happening with all pharmacies. When there is not enough medication, patients are offered a partial fill and they are told that they will try to get the rest of the medicine for them at a later time. If they do not have enough for even a partial fill, the pharmacists are telling the patients to call us (the prescribing physicians) to request that we send another prescription to another pharmacy to get the medicine.   This reordering of a controlled substance creates documentation problems where additional paperwork needs to be created to explain why two prescriptions for the same period of time and the same medicine are being prescribed to the same patient. It also creates situations where the last appointment note does not accurately reflect when and what prescriptions were given to a patient. This leads to prescribing errors down the line, in subsequent follow-up visits.   [employer] of Pharmacy ([employer]) Research revealed that Educational psychologist .1806 (21 NCAC 46.1806) authorizes pharmacists to the transfer of prescriptions among pharmacies, and it sets forth procedural and recordkeeping requirements for doing so. However, this requires the pharmacist to complete the previously mentioned procedural paperwork to accomplish the transfer. As it turns out, it is much easier for them to have the prescribing physicians do the work.   Possible solutions 1. Have the [facility] Assembly add a provision to the "STOP ACT" (the law that mandates how controlled substances are prescribed) where there is an exception to the electronic prescribing rule that states that in the event there are shortages of medications the physicians are allowed to use written prescriptions as opposed to electronic ones. This would allow patients to take their prescriptions to a different pharmacy that may have enough medication available to fill the prescription. The problem is that currently there is a law that does not allow for written prescriptions, with the exception of instances where the electronic medical record is down due to technical issues.  2. Have [place] Congress ease the pressure on pharmaceutical companies, allowing them to produce enough quantities of the medication to adequately supply the population. 3. Have pharmacies keep enough stocks of these medications to cover their client base.  4. Have the [facility] Assembly add a provision to the "STOP ACT" where they ease the regulations surrounding the transfer of controlled substances between pharmacies, so as to simplify the transfer of supplies. As an alternative, develop a system to allow patients to obtain the remainder of their prescription at another one of their pharmacies or at an associate pharmacy.   How this shortage will affect you.  The one thing that is abundantly clear is that this is a pharmacy supply problem  and not a prescriber problem. The job of the prescriber is to evaluate and monitor the patients for the appropriate indications to the use of these medicines, the monitoring of their use and the prescribing of the appropriate dose and regimen. It is not the job of the  prescriber to provide or dispense the actual medication. By law, this is the job of the pharmacies and pharmacists. It is certainly not the job of the prescriber to solve the supply problems.   Due to the above problems we are no longer taking  patients to write for their pain medication. We will continue to evaluate for appropriate indications and we may provide recommendations regarding medication, dose, and schedule, as well as monitoring recommendations, however, we will not be taking over the actual prescribing of these substances. On those patients where we are treating their chronic pain with interventional therapies, exceptions will be considered on a case by case basis. At this time, we will try to continue providing this supplemental service to those patients we have been managing in the past. However, as of August 1st, 2023, we no longer will be sending additional prescriptions to other pharmacies for the purpose of solving their supply problems. Once we send a prescription to a pharmacy, we will not be resending it again to another pharmacy to cover for their shortages.   What to do. Write as many letters as you can. Recruit the help of family members in writing these letters. Below are some of the places where you can write to make your voice heard. Let them know what the problem is and push them to look for solutions.   Search internet for: "[employer] [place] find your legislators" iixl.comhttps://www.ncleg.gov/findyourlegislators  Search internet for: "[place] insurance commissioner complaints" RareVoices.plhttps://www.ncdoi.gov/contactscomplaints/assistance-or-file-complaint  Search internet for: "[employer] [place] Board of Pharmacy complaints" MobLag.com.cyhttp://www.ncbop.org/contact.htm  Search internet for: "[facility] pharmacy complaints" Email [facility] Customer Relations https://www.graham-perkins.biz/https://www.cvs.com/help/email-[email or web address].jsp?callType=store  Search internet for: Architectural technologist"[facility] pharmacy customer service complaints" https://www.[facility].com/topic/marketing/contactus/contactus_customerservice.jsp  ____________________________________________________________________________________________   ____________________________________________________________________________________________  Medication Rules  Purpose: To inform patients, and their family members, of our rules and regulations.  Applies to: All patients receiving prescriptions (written or electronic).  Pharmacy of record: Pharmacy where electronic prescriptions will be sent. If written prescriptions are taken to a different pharmacy, please inform the nursing staff. The pharmacy listed in the electronic medical record should be the one where you would like electronic prescriptions to be sent.  Electronic prescriptions: In compliance with the [facility] Strengthen Opioid Misuse Prevention (STOP) Act of 2017 (Session [place]ni ElliotLaw 84746503182017-74/H243), effective October 02, 2018, all controlled substances must be electronically prescribed. Calling prescriptions to the pharmacy will cease to exist.  Prescription refills: Only during scheduled appointments. Applies to all prescriptions.  NOTE: The following applies primarily to controlled substances (Opioid* Pain Medications).   Type of encounter (visit): For patients receiving controlled substances, face-to-face visits are required. (Not an option or up to the patient.)  Patient's responsibilities: Pain Pills: Bring all pain pills to every appointment (except for procedure appointments). Pill Bottles: Bring pills in original pharmacy bottle. Always bring the newest bottle. Bring bottle, even if empty. Medication refills: You are responsible for knowing and keeping track of what medications you take and those you need refilled. The day before your appointment: write a list of all prescriptions that need to be refilled. The day of the appointment: give the list to the admitting nurse. Prescriptions will be written only during appointments. No prescriptions will be written on procedure days. If you forget a medication: it will not be "Called in", "Faxed", or "electronically sent". You will  need to get another appointment to get these prescribed. No early refills. Do not call asking to have your prescription filled early. Prescription Accuracy: You are responsible for carefully inspecting your prescriptions before  leaving our office. Have the discharge nurse carefully go over each prescription with you, before taking them home. Make sure that your name is accurately spelled, that your address is correct. Check the name and dose of your medication to make sure it is accurate. Check the number of pills, and the written instructions to make sure they are clear and accurate. Make sure that you are given enough medication to last until your next medication refill appointment. Taking Medication: Take medication as prescribed. When it comes to controlled substances, taking less pills or less frequently than prescribed is permitted and encouraged. Never take more pills than instructed. Never take medication more frequently than prescribed.  Inform other Doctors: Always inform, all of your healthcare providers, of all the medications you take. Pain Medication from other Providers: You are not allowed to accept any additional pain medication from any other Doctor or Healthcare provider. There are two exceptions to this rule. (see below) In the event that you require additional pain medication, you are responsible for notifying us, as stated below. Cough Medicine: Often these contain an opioid, such as codeine or hydrocodone. Never accept or take cough medicine containing these opioids if you are already taking an opioid* medication. The combination may cause respiratory failure and death. Medication Agreement: You are responsible for carefully reading and following our Medication Agreement. This must be signed before receiving any prescriptions from our practice. Safely store a copy of your signed Agreement. Violations to the Agreement will result in no further prescriptions. (Additional copies of our  Medication Agreement are available upon request.) Laws, Rules, & Regulations: All patients are expected to follow all [address] and [employer], Rules, [place] [employer]. Ignorance of the Laws does not constitute a valid excuse.  Illegal drugs and Controlled Substances: The use of illegal substances (including, but not limited to marijuana and its derivatives) and/or the illegal use of any controlled substances is strictly prohibited. Violation of this rule may result in the immediate and permanent discontinuation of any and all prescriptions being written by our practice. The use of any illegal substances is prohibited. Adopted CDC guidelines & recommendations: Target dosing levels will be at or below 60 MME/day. Use of benzodiazepines** is not recommended.  Exceptions: There are only two exceptions to the rule of not receiving pain medications from other Healthcare Providers. Exception #1 (Emergencies): In the event of an emergency (i.e.: accident requiring emergency care), you are allowed to receive additional pain medication. However, you are responsible for: As soon as you are able, call our office [phone], at any time of the day or night, and leave a message stating your name, the date and nature of the emergency, and the name and dose of the medication prescribed. In the event that your call is answered by a member of our staff, make sure to document and save the date, time, and the name of the person that took your information.  Exception #2 (Planned Surgery): In the event that you are scheduled by another doctor or dentist to have any type of surgery or procedure, you are allowed (for a period no longer than 30 days), to receive additional pain medication, for the acute post-op pain. However, in this case, you are responsible for picking up a copy of our "Post-op Pain Management for Surgeons" handout, and giving it to your surgeon or dentist. This document is available at our office, and  does not require an appointment to obtain it. Simply go to our office during  business hours (Monday-Thursday from 8:00 AM to 4:00 PM) (Friday 8:00 AM to 12:00 Noon) or if you have a scheduled appointment with [place], prior to your surgery, and ask for it by name. In addition, you are responsible for: calling our office [phone], at any time of the day or night, and leaving a message stating your name, name of your surgeon, type of surgery, and date of procedure or surgery. Failure to comply with your responsibilities may result in termination of therapy involving the controlled substances. Medication Agreement Violation. Following the above rules, including your responsibilities will help you in avoiding a Medication Agreement Violation ("Breaking your Pain Medication Contract").  *Opioid medications include: morphine, codeine, oxycodone, oxymorphone, hydrocodone, hydromorphone, meperidine, tramadol, tapentadol, buprenorphine, fentanyl, methadone. **Benzodiazepine medications include: diazepam (Valium), alprazolam (Xanax), clonazepam (Klonopine), lorazepam (Ativan), clorazepate (Tranxene), chlordiazepoxide (Librium), estazolam (Prosom), oxazepam (Serax), temazepam (Restoril), triazolam (Halcion) (Last updated: 06/29/2021) ____________________________________________________________________________________________  ____________________________________________________________________________________________  Medication Recommendations and Reminders  Applies to: All patients receiving prescriptions (written and/or electronic).  Medication Rules & Regulations: These rules and regulations exist for your safety and that of others. They are not flexible and neither are we. Dismissing or ignoring them will be considered "non-compliance" with medication therapy, resulting in complete and irreversible termination of such therapy. (See document titled "Medication Rules" for more details.) In all conscience,  because of safety reasons, we cannot continue providing a therapy where the patient does not follow instructions.  Pharmacy of record:  Definition: This is the pharmacy where your electronic prescriptions will be sent.  We do not endorse any particular pharmacy, however, we have experienced problems with [facility] not securing enough medication supply for the community. We do not restrict you in your choice of pharmacy. However, once we write for your prescriptions, we will NOT be re-sending more prescriptions to fix restricted supply problems created by your pharmacy, or your insurance.  The pharmacy listed in the electronic medical record should be the one where you want electronic prescriptions to be sent. If you choose to change pharmacy, simply notify our nursing staff.  Recommendations: Keep all of your pain medications in a safe place, under lock and key, even if you live alone. We will NOT replace lost, stolen, or damaged medication. After you fill your prescription, take 1 week's worth of pills and put them away in a safe place. You should keep a separate, properly labeled bottle for this purpose. The remainder should be kept in the original bottle. Use this as your primary supply, until it runs out. Once it's gone, then you know that you have 1 week's worth of medicine, and it is time to come in for a prescription refill. If you do this correctly, it is unlikely that you will ever run out of medicine. To make sure that the above recommendation works, it is very important that you make sure your medication refill appointments are scheduled at least 1 week before you run out of medicine. To do this in an effective manner, make sure that you do not leave the office without scheduling your next medication management appointment. Always ask the nursing staff to show you in your prescription , when your medication will be running out. Then arrange for the receptionist to get you a return appointment,  at least 7 days before you run out of medicine. Do not wait until you have 1 or 2 pills left, to come in. This is very poor planning and does not take into consideration that we may need  to cancel appointments due to bad weather, sickness, or emergencies affecting our staff. DO NOT ACCEPT A "Partial Fill": If for any reason your pharmacy does not have enough pills/tablets to completely fill or refill your prescription, do not allow for a "partial fill". The law allows the pharmacy to complete that prescription within 72 hours, without requiring a new prescription. If they do not fill the rest of your prescription within those 72 hours, you will need a separate prescription to fill the remaining amount, which we will NOT provide. If the reason for the partial fill is your insurance, you will need to talk to the pharmacist about payment alternatives for the remaining tablets, but again, DO NOT ACCEPT A PARTIAL FILL, unless you can trust your pharmacist to obtain the remainder of the pills within 72 hours.  Prescription refills and/or changes in medication(s):  Prescription refills, and/or changes in dose or medication, will be conducted only during scheduled medication management appointments. (Applies to both, written and electronic prescriptions.) No refills on procedure days. No medication will be changed or started on procedure days. No changes, adjustments, and/or refills will be conducted on a procedure day. Doing so will interfere with the diagnostic portion of the procedure. No phone refills. No medications will be "called into the pharmacy". No Fax refills. No weekend refills. No Holliday refills. No after hours refills.  Remember:  Business hours are:  Monday to Thursday 8:00 AM to 4:00 PM Provider's Schedule: Delano Metz, MD - Appointments are:  Medication management: Monday and Wednesday 8:00 AM to 4:00 PM Procedure day: Tuesday and Thursday 7:30 AM to 4:00 PM Edward Jolly, MD -  Appointments are:  Medication management: Tuesday and Thursday 8:00 AM to 4:00 PM Procedure day: Monday and Wednesday 7:30 AM to 4:00 PM (Last update: 04/21/2020) ____________________________________________________________________________________________  ____________________________________________________________________________________________  CBD (cannabidiol) & Delta-8 (Delta-8 tetrahydrocannabinol) WARNING  Intro: Cannabidiol (CBD) and tetrahydrocannabinol (THC), are two natural compounds found in plants of the Cannabis genus. They can both be extracted from hemp or cannabis. Hemp and cannabis come from the Cannabis sativa plant. Both compounds interact with your body's endocannabinoid system, but they have very different effects. CBD does not produce the high sensation associated with cannabis. Delta-8 tetrahydrocannabinol, also known as delta-8 THC, is a psychoactive substance found in the Cannabis sativa plant, of which marijuana and hemp are two varieties. THC is responsible for the high associated with the illicit use of marijuana.  Applicable to: All individuals currently taking or considering taking CBD (cannabidiol) and, more important, all patients taking opioid analgesic controlled substances (pain medication). (Example: oxycodone; oxymorphone; hydrocodone; hydromorphone; morphine; methadone; tramadol; tapentadol; fentanyl; buprenorphine; butorphanol; dextromethorphan; meperidine; codeine; etc.)  Legal status: CBD remains a Schedule I drug prohibited for any use. CBD is illegal with one exception. In the [place], CBD has a limited Education officer, environmental (FDA) approval for the treatment of two specific types of epilepsy disorders. Only one CBD product has been approved by the FDA for this purpose: "Epidiolex". FDA is aware that some companies are marketing products containing cannabis and cannabis-derived compounds in ways that violate the [employer], Drug and Cosmetic Act  [facility] Act) and that may put the health and safety of consumers at risk. The FDA, a Federal agency, has not enforced the CBD status since 2018. UPDATE: (11/18/2021) The Drug Enforcement Agency (DEA) issued a letter stating that "delta" cannabinoids, including Delta-8-THCO and Delta-9-THCO, synthetically derived from hemp do not qualify as hemp and will be viewed as  Schedule I drugs. (Schedule I drugs, substances, or chemicals are defined as drugs with no currently accepted medical use and a high potential for abuse. Some examples of Schedule I drugs are: heroin, lysergic acid diethylamide (LSD), marijuana (cannabis), 3,4-methylenedioxymethamphetamine (ecstasy), methaqualone, and peyote.) ([employer])  Legality: Some manufacturers ship CBD products nationally, which is illegal. Often such products are sold online and are therefore available throughout the country. CBD is openly sold in head shops and health food stores in some states where such sales have not been explicitly legalized. Selling unapproved products with unsubstantiated therapeutic claims is not only a violation of the law, but also can put patients at risk, as these products have not been proven to be safe or effective. Federal illegality makes it difficult to conduct research on CBD.  Reference: "FDA Regulation of Cannabis and Cannabis-Derived Products, Including Cannabidiol (CBD)" - [employer]  Warning: CBD is not FDA approved and has not undergo the same manufacturing controls as prescription drugs.  This means that the purity and safety of available CBD may be questionable. Most of the time, despite manufacturer's claims, it is contaminated with THC (delta-9-tetrahydrocannabinol - the chemical in marijuana responsible for the "HIGH").  When this is the case, the [facility] contaminant will trigger a positive urine drug  screen (UDS) test for Marijuana (carboxy-THC). Because a positive UDS for any illicit substance is a violation of our medication agreement, your opioid analgesics (pain medicine) may be permanently discontinued. The FDA recently put out a warning about 5 things that everyone should be aware of regarding Delta-8 THC: Delta-8 THC products have not been evaluated or approved by the FDA for safe use and may be marketed in ways that put the public health at risk. The FDA has received adverse event reports involving delta-8 THC-containing products. Delta-8 THC has psychoactive and intoxicating effects. Delta-8 THC manufacturing often involve use of potentially harmful chemicals to create the concentrations of delta-8 THC claimed in the marketplace. The final delta-8 THC product may have potentially harmful by-products (contaminants) due to the chemicals used in the process. Manufacturing of delta-8 THC products may occur in uncontrolled or unsanitary settings, which may lead to the presence of unsafe contaminants or other potentially harmful substances. Delta-8 THC products should be kept out of the reach of children and pets.  MORE ABOUT CBD  General Information: CBD was discovered in [birth year] and it is a derivative of the cannabis sativa genus plants (Marijuana and Hemp). It is one of the 113 identified substances found in Marijuana. It accounts for up to 40% of the plant's extract. As of 2018, preliminary clinical studies on CBD included research for the treatment of anxiety, movement disorders, and pain. CBD is available and consumed in multiple forms, including inhalation of smoke or vapor, as an aerosol spray, and by mouth. It may be supplied as an oil containing CBD, capsules, dried cannabis, or as a liquid solution. CBD is thought not to be as psychoactive as THC (delta-9-tetrahydrocannabinol - the chemical in marijuana responsible for the "HIGH"). Studies suggest that CBD may interact with different  biological target receptors in the body, including cannabinoid and other neurotransmitter receptors. As of 2018 the mechanism of action for its biological effects has not been determined.  Side-effects  Adverse reactions: Dry mouth, diarrhea, decreased appetite, fatigue, drowsiness, malaise, weakness, sleep disturbances, and others.  Drug interactions: CBC may interact with other medications such as blood-thinners. Because CBD causes drowsiness on its own, it also increases the drowsiness caused by other medications,  including antihistamines (such as Benadryl), benzodiazepines (Xanax, Ativan, Valium), antipsychotics, antidepressants and opioids, as well as alcohol and supplements such as kava, melatonin and St. John's Wort. Be cautious with the following combinations:   Brivaracetam (Briviact) Brivaracetam is changed and broken down by the body. CBD might decrease how quickly the body breaks down brivaracetam. This might increase levels of brivaracetam in the body.  Caffeine Caffeine is changed and broken down by the body. CBD might decrease how quickly the body breaks down caffeine. This might increase levels of caffeine in the body.  Carbamazepine (Tegretol) Carbamazepine is changed and broken down by the body. CBD might decrease how quickly the body breaks down carbamazepine. This might increase levels of carbamazepine in the body and increase its side effects.  Citalopram (Celexa) Citalopram is changed and broken down by the body. CBD might decrease how quickly the body breaks down citalopram. This might increase levels of citalopram in the body and increase its side effects.  Clobazam (Onfi) Clobazam is changed and broken down by the liver. CBD might decrease how quickly the liver breaks down clobazam. This might increase the effects and side effects of clobazam.  Eslicarbazepine (Aptiom) Eslicarbazepine is changed and broken down by the body. CBD might decrease how quickly the body  breaks down eslicarbazepine. This might increase levels of eslicarbazepine in the body by a small amount.  Everolimus (Zostress) Everolimus is changed and broken down by the body. CBD might decrease how quickly the body breaks down everolimus. This might increase levels of everolimus in the body.  Lithium Taking higher doses of CBD might increase levels of lithium. This can increase the risk of lithium toxicity.  Medications changed by the liver (Cytochrome P450 1A1 (CYP1A1) substrates) Some medications are changed and broken down by the liver. CBD might change how quickly the liver breaks down these medications. This could change the effects and side effects of these medications.  Medications changed by the liver (Cytochrome P450 1A2 (CYP1A2) substrates) Some medications are changed and broken down by the liver. CBD might change how quickly the liver breaks down these medications. This could change the effects and side effects of these medications.  Medications changed by the liver (Cytochrome P450 1B1 (CYP1B1) substrates) Some medications are changed and broken down by the liver. CBD might change how quickly the liver breaks down these medications. This could change the effects and side effects of these medications.  Medications changed by the liver (Cytochrome P450 2A6 (CYP2A6) substrates) Some medications are changed and broken down by the liver. CBD might change how quickly the liver breaks down these medications. This could change the effects and side effects of these medications.  Medications changed by the liver (Cytochrome P450 2B6 (CYP2B6) substrates) Some medications are changed and broken down by the liver. CBD might change how quickly the liver breaks down these medications. This could change the effects and side effects of these medications.  Medications changed by the liver (Cytochrome P450 2C19 (CYP2C19) substrates) Some medications are changed and broken down by the liver.  CBD might change how quickly the liver breaks down these medications. This could change the effects and side effects of these medications.  Medications changed by the liver (Cytochrome P450 2C8 (CYP2C8) substrates) Some medications are changed and broken down by the liver. CBD might change how quickly the liver breaks down these medications. This could change the effects and side effects of these medications.  Medications changed by the liver (Cytochrome P450 2C9 (CYP2C9) substrates) Some  medications are changed and broken down by the liver. CBD might change how quickly the liver breaks down these medications. This could change the effects and side effects of these medications.  Medications changed by the liver (Cytochrome P450 2D6 (CYP2D6) substrates) Some medications are changed and broken down by the liver. CBD might change how quickly the liver breaks down these medications. This could change the effects and side effects of these medications.  Medications changed by the liver (Cytochrome P450 2E1 (CYP2E1) substrates) Some medications are changed and broken down by the liver. CBD might change how quickly the liver breaks down these medications. This could change the effects and side effects of these medications.  Medications changed by the liver (Cytochrome P450 3A4 (CYP3A4) substrates) Some medications are changed and broken down by the liver. CBD might change how quickly the liver breaks down these medications. This could change the effects and side effects of these medications.  Medications changed by the liver (Glucuronidated drugs) Some medications are changed and broken down by the liver. CBD might change how quickly the liver breaks down these medications. This could change the effects and side effects of these medications.  Medications that decrease the breakdown of other medications by the liver (Cytochrome P450 2C19 (CYP2C19) inhibitors) CBD is changed and broken down by the liver.  Some drugs decrease how quickly the liver changes and breaks down CBD. This could change the effects and side effects of CBD.  Medications that decrease the breakdown of other medications in the liver (Cytochrome P450 3A4 (CYP3A4) inhibitors) CBD is changed and broken down by the liver. Some drugs decrease how quickly the liver changes and breaks down CBD. This could change the effects and side effects of CBD.  Medications that increase breakdown of other medications by the liver (Cytochrome P450 3A4 (CYP3A4) inducers) CBD is changed and broken down by the liver. Some drugs increase how quickly the liver changes and breaks down CBD. This could change the effects and side effects of CBD.  Medications that increase the breakdown of other medications by the liver (Cytochrome P450 2C19 (CYP2C19) inducers) CBD is changed and broken down by the liver. Some drugs increase how quickly the liver changes and breaks down CBD. This could change the effects and side effects of CBD.  Methadone (Dolophine) Methadone is broken down by the liver. CBD might decrease how quickly the liver breaks down methadone. Taking cannabidiol along with methadone might increase the effects and side effects of methadone.  Rufinamide (Banzel) Rufinamide is changed and broken down by the body. CBD might decrease how quickly the body breaks down rufinamide. This might increase levels of rufinamide in the body by a small amount.  Sedative medications (CNS depressants) CBD might cause sleepiness and slowed breathing. Some medications, called sedatives, can also cause sleepiness and slowed breathing. Taking CBD with sedative medications might cause breathing problems and/or too much sleepiness.  Sirolimus (Rapamune) Sirolimus is changed and broken down by the body. CBD might decrease how quickly the body breaks down sirolimus. This might increase levels of sirolimus in the body.  Stiripentol (Diacomit) Stiripentol is changed and  broken down by the body. CBD might decrease how quickly the body breaks down stiripentol. This might increase levels of stiripentol in the body and increase its side effects.  Tacrolimus (Prograf) Tacrolimus is changed and broken down by the body. CBD might decrease how quickly the body breaks down tacrolimus. This might increase levels of tacrolimus in the body.  Tamoxifen (Soltamox) Tamoxifen  is changed and broken down by the body. CBD might affect how quickly the body breaks down tamoxifen. This might affect levels of tamoxifen in the body.  Topiramate (Topamax) Topiramate is changed and broken down by the body. CBD might decrease how quickly the body breaks down topiramate. This might increase levels of topiramate in the body by a small amount.  Valproate Valproic acid can cause liver injury. Taking cannabidiol with valproic acid might increase the chance of liver injury. CBD and/or valproic acid might need to be stopped, or the dose might need to be reduced.  Warfarin (Coumadin) CBD might increase levels of warfarin, which can increase the risk for bleeding. CBD and/or warfarin might need to be stopped, or the dose might need to be reduced.  Zonisamide Zonisamide is changed and broken down by the body. CBD might decrease how quickly the body breaks down zonisamide. This might increase levels of zonisamide in the body by a small amount. (Last update: 11/30/2021) ____________________________________________________________________________________________  ____________________________________________________________________________________________  Drug Holidays (Slow)  What is a "Drug Holiday"? Drug Holiday: is the name given to the period of time during which a patient stops taking a medication(s) for the purpose of eliminating tolerance to the drug.  Benefits Improved effectiveness of opioids. Decreased opioid dose needed to achieve benefits. Improved pain with lesser  dose.  What is tolerance? Tolerance: is the progressive decreased in effectiveness of a drug due to its repetitive use. With repetitive use, the body gets use to the medication and as a consequence, it loses its effectiveness. This is a common problem seen with opioid pain medications. As a result, a larger dose of the drug is needed to achieve the same effect that used to be obtained with a smaller dose.  How long should a "Drug Holiday" last? You should stay off of the pain medicine for at least 14 consecutive days. (2 weeks)  Should I stop the medicine "cold [place]"? No. You should always coordinate with your Pain Specialist so that he/she can provide you with the correct medication dose to make the transition as smoothly as possible.  How do I stop the medicine? Slowly. You will be instructed to decrease the daily amount of pills that you take by one (1) pill every seven (7) days. This is called a "slow downward taper" of your dose. For example: if you normally take four (4) pills per day, you will be asked to drop this dose to three (3) pills per day for seven (7) days, then to two (2) pills per day for seven (7) days, then to one (1) per day for seven (7) days, and at the end of those last seven (7) days, this is when the "Drug Holiday" would start.   Will I have withdrawals? By doing a "slow downward taper" like this one, it is unlikely that you will experience any significant withdrawal symptoms. Typically, what triggers withdrawals is the sudden stop of a high dose opioid therapy. Withdrawals can usually be avoided by slowly decreasing the dose over a prolonged period of time. If you do not follow these instructions and decide to stop your medication abruptly, withdrawals may be possible.  What are withdrawals? Withdrawals: refers to the wide range of symptoms that occur after stopping or dramatically reducing opiate drugs after heavy and prolonged use. Withdrawal symptoms do not occur to  patients that use low dose opioids, or those who take the medication sporadically. Contrary to benzodiazepine (example: Valium, Xanax, etc.) or alcohol withdrawals ("Delirium Tremens"),  opioid withdrawals are not lethal. Withdrawals are the physical manifestation of the body getting rid of the excess receptors.  Expected Symptoms Early symptoms of withdrawal may include: Agitation Anxiety Muscle aches Increased tearing Insomnia Runny nose Sweating Yawning  Late symptoms of withdrawal may include: Abdominal cramping Diarrhea Dilated pupils Goose bumps Nausea Vomiting  Will I experience withdrawals? Due to the slow nature of the taper, it is very unlikely that you will experience any.  What is a slow taper? Taper: refers to the gradual decrease in dose.  (Last update: 04/21/2020) ____________________________________________________________________________________________

## 2022-04-17 NOTE — Progress Notes (Signed)
Nursing Pain Medication Assessment:  Safety precautions to be maintained throughout the outpatient stay will include: orient to surroundings, keep bed in low position, maintain call bell within reach at all times, provide assistance with transfer out of bed and ambulation.  Medication Inspection Compliance: Pill count conducted under aseptic conditions, in front of the patient. Neither the pills nor the bottle was removed from the patient's sight at any time. Once count was completed pills were immediately returned to the patient in their original bottle.  Medication: Hydrocodone/APAP Pill/Patch Count:  11 of 60 pills remain Pill/Patch Appearance: Markings consistent with prescribed medication Bottle Appearance: Standard pharmacy container. Clearly labeled. Filled Date: 07 / 03 / 2023 Last Medication intake:  Today

## 2022-04-17 NOTE — Chronic Care Management (AMB) (Signed)
  Care Coordination  Note  04/17/2022 Name: Carmen [name] MRN: [id number] DOB: 11-04-1940  Carmen [name] is a 81 y.o. year old female who is a primary care patient of Mort Sawyers, FNP. I reached out to [facility] by phone today to offer care coordination services.       Follow up plan: Unsuccessful telephone outreach attempt made. A HIPAA compliant phone message was left for the patient providing contact information and requesting a return call.   Burman Nieves, CCMA Care Coordination Care Guide Direct Dial: [phone]

## 2022-04-18 ENCOUNTER — Telehealth: Payer: Self-pay

## 2022-04-18 DIAGNOSIS — Z79899 Other long term (current) drug therapy: Secondary | ICD-10-CM | POA: Diagnosis not present

## 2022-04-18 DIAGNOSIS — Z79891 Long term (current) use of opiate analgesic: Secondary | ICD-10-CM | POA: Diagnosis not present

## 2022-04-18 DIAGNOSIS — G8929 Other chronic pain: Secondary | ICD-10-CM | POA: Diagnosis not present

## 2022-04-18 DIAGNOSIS — G894 Chronic pain syndrome: Secondary | ICD-10-CM | POA: Diagnosis not present

## 2022-04-18 NOTE — Telephone Encounter (Signed)
Called pt daughter left her a voicemail to let her know that paperwork is ready, and has been faxed. I also advised there is a copy of front if she wanted to pick it up.

## 2022-04-18 NOTE — Telephone Encounter (Signed)
-----   Message from Mort Sawyers, [place] sent at 04/18/2022 10:15 AM EDT ----- Please fax paperwork for pt facility and advise pt and or daugther this has been faxed, they might want a copy?   Then send to scans. I would keep a copy at your desk just in case they need it faxed again as scans may lose it.

## 2022-04-18 NOTE — Progress Notes (Signed)
Paperwork completed.  Please fax. Close this when done. Notify patent.

## 2022-04-22 LAB — TOXASSURE SELECT 13 (MW), URINE

## 2022-05-08 ENCOUNTER — Encounter (HOSPITAL_COMMUNITY): Payer: Self-pay

## 2022-05-08 ENCOUNTER — Other Ambulatory Visit: Payer: Self-pay

## 2022-05-08 ENCOUNTER — Observation Stay (HOSPITAL_COMMUNITY): Payer: Medicare Other

## 2022-05-08 ENCOUNTER — Observation Stay (HOSPITAL_COMMUNITY)
Admission: EM | Admit: 2022-05-08 | Discharge: 2022-05-09 | Disposition: A | Discharge status: Home Health | Payer: Medicare Other | Attending: Internal Medicine | Admitting: Internal Medicine

## 2022-05-08 ENCOUNTER — Emergency Department (HOSPITAL_COMMUNITY): Payer: Medicare Other

## 2022-05-08 DIAGNOSIS — I480 Paroxysmal atrial fibrillation: Secondary | ICD-10-CM | POA: Diagnosis present

## 2022-05-08 DIAGNOSIS — E785 Hyperlipidemia, unspecified: Secondary | ICD-10-CM | POA: Diagnosis not present

## 2022-05-08 DIAGNOSIS — Z951 Presence of aortocoronary bypass graft: Secondary | ICD-10-CM | POA: Diagnosis not present

## 2022-05-08 DIAGNOSIS — Z7901 Long term (current) use of anticoagulants: Secondary | ICD-10-CM | POA: Diagnosis not present

## 2022-05-08 DIAGNOSIS — I6782 Cerebral ischemia: Secondary | ICD-10-CM | POA: Diagnosis not present

## 2022-05-08 DIAGNOSIS — R413 Other amnesia: Secondary | ICD-10-CM | POA: Diagnosis not present

## 2022-05-08 DIAGNOSIS — Z79899 Other long term (current) drug therapy: Secondary | ICD-10-CM | POA: Diagnosis not present

## 2022-05-08 DIAGNOSIS — Z8673 Personal history of transient ischemic attack (TIA), and cerebral infarction without residual deficits: Secondary | ICD-10-CM | POA: Diagnosis not present

## 2022-05-08 DIAGNOSIS — R404 Transient alteration of awareness: Secondary | ICD-10-CM | POA: Diagnosis not present

## 2022-05-08 DIAGNOSIS — Z7982 Long term (current) use of aspirin: Secondary | ICD-10-CM | POA: Diagnosis not present

## 2022-05-08 DIAGNOSIS — R2990 Unspecified symptoms and signs involving the nervous system: Secondary | ICD-10-CM | POA: Diagnosis not present

## 2022-05-08 DIAGNOSIS — R29818 Other symptoms and signs involving the nervous system: Principal | ICD-10-CM | POA: Insufficient documentation

## 2022-05-08 DIAGNOSIS — I639 Cerebral infarction, unspecified: Secondary | ICD-10-CM | POA: Diagnosis not present

## 2022-05-08 DIAGNOSIS — Z87891 Personal history of nicotine dependence: Secondary | ICD-10-CM | POA: Diagnosis not present

## 2022-05-08 DIAGNOSIS — I10 Essential (primary) hypertension: Secondary | ICD-10-CM | POA: Diagnosis not present

## 2022-05-08 DIAGNOSIS — G894 Chronic pain syndrome: Secondary | ICD-10-CM | POA: Diagnosis present

## 2022-05-08 DIAGNOSIS — R569 Unspecified convulsions: Secondary | ICD-10-CM

## 2022-05-08 DIAGNOSIS — I2510 Atherosclerotic heart disease of native coronary artery without angina pectoris: Secondary | ICD-10-CM | POA: Insufficient documentation

## 2022-05-08 DIAGNOSIS — R9431 Abnormal electrocardiogram [ECG] [EKG]: Secondary | ICD-10-CM | POA: Diagnosis not present

## 2022-05-08 DIAGNOSIS — Z20822 Contact with and (suspected) exposure to covid-19: Secondary | ICD-10-CM | POA: Diagnosis not present

## 2022-05-08 DIAGNOSIS — E039 Hypothyroidism, unspecified: Secondary | ICD-10-CM | POA: Diagnosis present

## 2022-05-08 DIAGNOSIS — I672 Cerebral atherosclerosis: Secondary | ICD-10-CM | POA: Diagnosis not present

## 2022-05-08 DIAGNOSIS — F05 Delirium due to known physiological condition: Secondary | ICD-10-CM | POA: Diagnosis present

## 2022-05-08 DIAGNOSIS — I4891 Unspecified atrial fibrillation: Secondary | ICD-10-CM | POA: Diagnosis not present

## 2022-05-08 DIAGNOSIS — Z955 Presence of coronary angioplasty implant and graft: Secondary | ICD-10-CM | POA: Insufficient documentation

## 2022-05-08 DIAGNOSIS — I6523 Occlusion and stenosis of bilateral carotid arteries: Secondary | ICD-10-CM | POA: Diagnosis not present

## 2022-05-08 DIAGNOSIS — I25810 Atherosclerosis of coronary artery bypass graft(s) without angina pectoris: Secondary | ICD-10-CM | POA: Diagnosis present

## 2022-05-08 DIAGNOSIS — R29810 Facial weakness: Secondary | ICD-10-CM | POA: Diagnosis not present

## 2022-05-08 LAB — URINALYSIS, ROUTINE W REFLEX MICROSCOPIC
Bacteria, UA: NONE SEEN
Bilirubin Urine: NEGATIVE
Glucose, UA: NEGATIVE mg/dL
Hgb urine dipstick: NEGATIVE
Ketones, ur: NEGATIVE mg/dL
Leukocytes,Ua: NEGATIVE
Nitrite: NEGATIVE
Protein, ur: 100 mg/dL — AB
Specific Gravity, Urine: 1.021 (ref 1.005–1.030)
pH: 7 (ref 5.0–8.0)

## 2022-05-08 LAB — COMPREHENSIVE METABOLIC PANEL
ALT: 20 U/L (ref 0–44)
AST: 26 U/L (ref 15–41)
Albumin: 4.3 g/dL (ref 3.5–5.0)
Alkaline Phosphatase: 42 U/L (ref 38–126)
Anion gap: 13 (ref 5–15)
BUN: 13 mg/dL (ref 8–23)
CO2: 22 mmol/L (ref 22–32)
Calcium: 9.6 mg/dL (ref 8.9–10.3)
Chloride: 102 mmol/L (ref 98–111)
Creatinine, Ser: 0.97 mg/dL (ref 0.44–1.00)
GFR, Estimated: 59 mL/min — ABNORMAL LOW (ref >60)
Glucose, Bld: 138 mg/dL — ABNORMAL HIGH (ref 70–99)
Potassium: 3.6 mmol/L (ref 3.5–5.1)
Sodium: 137 mmol/L (ref 135–145)
Total Bilirubin: 0.7 mg/dL (ref 0.3–1.2)
Total Protein: 7 g/dL (ref 6.5–8.1)

## 2022-05-08 LAB — CBC
HCT: 39.2 % (ref 36.0–46.0)
Hemoglobin: 13.1 g/dL (ref 12.0–15.0)
MCH: 31 pg (ref 26.0–34.0)
MCHC: 33.4 g/dL (ref 30.0–36.0)
MCV: 92.7 fL (ref 80.0–100.0)
Platelets: 216 10*3/uL (ref 150–400)
RBC: 4.23 MIL/uL (ref 3.87–5.11)
RDW: 12.8 % (ref 11.5–15.5)
WBC: 10.3 10*3/uL (ref 4.0–10.5)
nRBC: 0 % (ref 0.0–0.2)

## 2022-05-08 LAB — CBG MONITORING, ED: Glucose-Capillary: 132 mg/dL — ABNORMAL HIGH (ref 70–99)

## 2022-05-08 LAB — MAGNESIUM: Magnesium: 1.4 mg/dL — ABNORMAL LOW (ref 1.7–2.4)

## 2022-05-08 LAB — TROPONIN I (HIGH SENSITIVITY)
Troponin I (High Sensitivity): 13 ng/L (ref <18)
Troponin I (High Sensitivity): 16 ng/L (ref <18)

## 2022-05-08 LAB — DIFFERENTIAL
Abs Immature Granulocytes: 0.07 10*3/uL (ref 0.00–0.07)
Basophils Absolute: 0 10*3/uL (ref 0.0–0.1)
Basophils Relative: 0 %
Eosinophils Absolute: 0.1 10*3/uL (ref 0.0–0.5)
Eosinophils Relative: 1 %
Immature Granulocytes: 1 %
Lymphocytes Relative: 15 %
Lymphs Abs: 1.5 10*3/uL (ref 0.7–4.0)
Monocytes Absolute: 0.9 10*3/uL (ref 0.1–1.0)
Monocytes Relative: 9 %
Neutro Abs: 7.7 10*3/uL (ref 1.7–7.7)
Neutrophils Relative %: 74 %

## 2022-05-08 LAB — HEMOGLOBIN A1C
Hgb A1c MFr Bld: 6.3 % — ABNORMAL HIGH (ref 4.8–5.6)
Mean Plasma Glucose: 134.11 mg/dL

## 2022-05-08 LAB — RESP PANEL BY RT-PCR (FLU A&B, COVID) ARPGX2
Influenza A by PCR: NEGATIVE
Influenza B by PCR: NEGATIVE
SARS Coronavirus 2 by RT PCR: NEGATIVE

## 2022-05-08 LAB — I-STAT CHEM 8, ED
BUN: 15 mg/dL (ref 8–23)
Calcium, Ion: 1.05 mmol/L — ABNORMAL LOW (ref 1.15–1.40)
Chloride: 101 mmol/L (ref 98–111)
Creatinine, Ser: 0.9 mg/dL (ref 0.44–1.00)
Glucose, Bld: 135 mg/dL — ABNORMAL HIGH (ref 70–99)
HCT: 41 % (ref 36.0–46.0)
Hemoglobin: 13.9 g/dL (ref 12.0–15.0)
Potassium: 3.6 mmol/L (ref 3.5–5.1)
Sodium: 136 mmol/L (ref 135–145)
TCO2: 22 mmol/L (ref 22–32)

## 2022-05-08 LAB — RAPID URINE DRUG SCREEN, HOSP PERFORMED
Amphetamines: NOT DETECTED
Barbiturates: NOT DETECTED
Benzodiazepines: NOT DETECTED
Cocaine: NOT DETECTED
Opiates: POSITIVE — AB
Tetrahydrocannabinol: NOT DETECTED

## 2022-05-08 LAB — PROTIME-INR
INR: 1.1 (ref 0.8–1.2)
Prothrombin Time: 14.4 seconds (ref 11.4–15.2)

## 2022-05-08 LAB — ETHANOL: Alcohol, Ethyl (B): <10 mg/dL (ref <10)

## 2022-05-08 LAB — APTT: aPTT: 24 seconds (ref 24–36)

## 2022-05-08 MED ORDER — LEVOTHYROXINE SODIUM 100 MCG PO TABS
100.0000 ug | ORAL_TABLET | Freq: Every day | ORAL | Status: DC
  Administered 2022-05-09: 100 ug via ORAL
  Filled 2022-05-08: qty 1

## 2022-05-08 MED ORDER — STROKE: EARLY STAGES OF RECOVERY BOOK
Freq: Once | Status: AC
  Filled 2022-05-08: qty 1

## 2022-05-08 MED ORDER — ACETAMINOPHEN 160 MG/5ML PO SOLN: 650.0000 mg | ORAL | Status: DC | PRN

## 2022-05-08 MED ORDER — IOHEXOL 350 MG/ML SOLN
75.0000 mL | Freq: Once | INTRAVENOUS | Status: AC | PRN
  Administered 2022-05-08: 75 mL via INTRAVENOUS

## 2022-05-08 MED ORDER — LORAZEPAM 2 MG/ML IJ SOLN
INTRAMUSCULAR | Status: AC
  Filled 2022-05-08: qty 1

## 2022-05-08 MED ORDER — TRAZODONE HCL 100 MG PO TABS: 100.0000 mg | ORAL_TABLET | Freq: Every evening | ORAL | Status: DC | PRN

## 2022-05-08 MED ORDER — FENTANYL CITRATE PF 50 MCG/ML IJ SOSY: 25.0000 ug | PREFILLED_SYRINGE | Freq: Once | INTRAMUSCULAR | Status: DC

## 2022-05-08 MED ORDER — ASPIRIN 81 MG PO TBEC
81.0000 mg | DELAYED_RELEASE_TABLET | Freq: Every day | ORAL | Status: DC
  Administered 2022-05-08 – 2022-05-09 (×2): 81 mg via ORAL
  Filled 2022-05-08 (×2): qty 1

## 2022-05-08 MED ORDER — APIXABAN 5 MG PO TABS
5.0000 mg | ORAL_TABLET | Freq: Two times a day (BID) | ORAL | Status: DC
  Administered 2022-05-08 – 2022-05-09 (×2): 5 mg via ORAL
  Filled 2022-05-08 (×2): qty 1

## 2022-05-08 MED ORDER — HYDROCODONE-ACETAMINOPHEN 5-325 MG PO TABS
1.0000 | ORAL_TABLET | Freq: Two times a day (BID) | ORAL | Status: DC | PRN
  Administered 2022-05-08: 1 via ORAL
  Filled 2022-05-08: qty 1

## 2022-05-08 MED ORDER — ACETAMINOPHEN 325 MG PO TABS
650.0000 mg | ORAL_TABLET | ORAL | Status: DC | PRN
  Administered 2022-05-08 – 2022-05-09 (×3): 650 mg via ORAL
  Filled 2022-05-08 (×3): qty 2

## 2022-05-08 MED ORDER — MIDAZOLAM HCL 2 MG/2ML IJ SOLN
0.5000 mg | Freq: Once | INTRAMUSCULAR | Status: AC
  Administered 2022-05-08: 0.5 mg via INTRAVENOUS
  Filled 2022-05-08: qty 2

## 2022-05-08 MED ORDER — HYDROCHLOROTHIAZIDE 25 MG PO TABS
25.0000 mg | ORAL_TABLET | Freq: Every day | ORAL | Status: DC
  Administered 2022-05-09: 25 mg via ORAL
  Filled 2022-05-08: qty 1

## 2022-05-08 MED ORDER — ACETAMINOPHEN 650 MG RE SUPP: 650.0000 mg | RECTAL | Status: DC | PRN

## 2022-05-08 MED ORDER — LORATADINE 10 MG PO TABS
10.0000 mg | ORAL_TABLET | Freq: Every day | ORAL | Status: DC
  Administered 2022-05-09: 10 mg via ORAL
  Filled 2022-05-08 (×2): qty 1

## 2022-05-08 MED ORDER — ATORVASTATIN CALCIUM 40 MG PO TABS
40.0000 mg | ORAL_TABLET | Freq: Every day | ORAL | Status: DC
  Administered 2022-05-08 – 2022-05-09 (×2): 40 mg via ORAL
  Filled 2022-05-08 (×2): qty 1

## 2022-05-08 MED ORDER — ENOXAPARIN SODIUM 40 MG/0.4ML IJ SOSY: 40.0000 mg | PREFILLED_SYRINGE | INTRAMUSCULAR | Status: DC

## 2022-05-08 MED ORDER — SENNOSIDES-DOCUSATE SODIUM 8.6-50 MG PO TABS: 1.0000 | ORAL_TABLET | Freq: Every evening | ORAL | Status: DC | PRN

## 2022-05-08 MED ORDER — ONDANSETRON HCL 4 MG/2ML IJ SOLN
4.0000 mg | Freq: Once | INTRAMUSCULAR | Status: AC
  Administered 2022-05-08: 4 mg via INTRAVENOUS
  Filled 2022-05-08: qty 2

## 2022-05-08 MED ORDER — METOPROLOL SUCCINATE ER 50 MG PO TB24
50.0000 mg | ORAL_TABLET | Freq: Every day | ORAL | Status: DC
  Administered 2022-05-08 – 2022-05-09 (×2): 50 mg via ORAL
  Filled 2022-05-08 (×2): qty 1

## 2022-05-08 NOTE — ED Triage Notes (Signed)
Pt BIB [facility] from [facility] for a code stroke. Pt slid out of her chair at 7am this morning but had no complaints or pain and was still at baseline. Daughter left for awhile and staff last checked on the pt at 1050 and she was normal daughter then came and found her back in the floor with a right facial droop, right sided weakness and slurred speech. Pt's LKW was 1050.

## 2022-05-08 NOTE — Assessment & Plan Note (Signed)
Fasting lipid panel in AM Goal LDL <70 Continue lipitor

## 2022-05-08 NOTE — ED Notes (Signed)
ED TO INPATIENT HANDOFF REPORT  ED Nurse Name and Phone #: Paulino Rily [phone]  S Name/Age/Gender Carmen [name] 81 y.o. female Room/Bed: 029C/029C  Code Status   Code Status: Prior  Home/SNF/Other Skilled nursing facility Patient oriented to: self, place, time, and situation Is this baseline? Yes   Triage Complete: Triage complete  Chief Complaint Stroke-like symptoms [R29.90]  Triage Note Pt BIB [facility] from [facility] for a code stroke. Pt slid out of her chair at 7am this morning but had no complaints or pain and was still at baseline. Daughter left for awhile and staff last checked on the pt at 1050 and she was normal daughter then came and found her back in the floor with a right facial droop, right sided weakness and slurred speech. Pt's LKW was 1050.    Allergies No Known Allergies  Level of Care/Admitting Diagnosis ED Disposition     ED Disposition  Admit   Condition  --   Comment  Hospital Area: [facility] [[id number]]  Level of Care: Progressive [102]  Admit to Progressive based on following criteria: NEUROLOGICAL AND NEUROSURGICAL complex patients with significant risk of instability, who do not meet ICU criteria, yet require close observation or frequent assessment (< / = every 2 - 4 hours) with medical / nursing intervention.  May place patient in observation at [facility] or [facility] if equivalent level of care is available:: No  Covid Evaluation: Confirmed COVID Negative  Diagnosis: Stroke-like symptoms [102725]  Admitting Physician: Orland Mustard [[id number]]  Attending Physician: Orland Mustard [[id number]]          B Medical/Surgery History Past Medical History:  Diagnosis Date   Abnormal CT scan, lumbar spine (05/11/2021) 05/17/2021   (05/11/2021) LUMBAR CT FINDINGS: Alignment: Lumbar levocurvature, apex L4. Mild lateral listhesis L4 on L5 of approximately 3 mm. Vertebrae: Remote appearing superior endplate  deformities at T12 with 10% height loss and L1 with up to 20% height loss. Multilevel discogenic and facet degenerative changes. Mild bilateral SI joint arthrosis.  DISC LEVELS: T11-T12: Near complete disc height loss with de   Abnormal MRI, cervical spine (05/12/2021) 05/17/2021   (05/12/2021) CERVICAL MRI FINDINGS: Motion artifact is present. Posterior Fossa, vertebral arteries, paraspinal tissues: Left superior cerebellar infarct.   DISC LEVELS: C2-C3: Disc bulge with endplate osteophytes. Uncovertebral and facet hypertrophy. C3-C4: Disc bulge with endplate osteophytes. Uncovertebral and facet hypertrophy. Mild canal stenosis. Marked foraminal stenosis. 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 10/02/2020   Stroke [facility])    Thyroid disease    Past Surgical History:  Procedure Laterality Date   back injections     CARDIAC CATHETERIZATION     CATARACT EXTRACTION     CORONARY ANGIOPLASTY     CORONARY ARTERY BYPASS GRAFT     2005     A IV Location/Drains/Wounds Patient Lines/Drains/Airways Status     Active Line/Drains/Airways     Name Placement date Placement time Site Days   Peripheral IV 06/28/21 22 G Anterior;Proximal;Right Forearm 06/28/21  0920  Forearm  314   Peripheral IV 11/03/21 22 G Right Antecubital 11/03/21  0927  Antecubital  186   Peripheral IV 05/08/22 18 G Left;Posterior Forearm 05/08/22  1203  Forearm  less than 1            Intake/Output Last 24 hours No intake or output data in the 24 hours ending 05/08/22 1632  Labs/Imaging Results for orders placed or performed during the hospital encounter of 05/08/22 (from the past 48 hour(s))  Resp Panel by RT-PCR (Flu A&B, Covid) Anterior Nasal Swab     Status: None   Collection  Time: 05/08/22 12:05 PM   Specimen: Anterior Nasal Swab  Result Value Ref Range   SARS Coronavirus 2 by RT PCR NEGATIVE NEGATIVE    Comment: (NOTE) SARS-CoV-2 target nucleic acids are NOT DETECTED.  The SARS-CoV-2 RNA is generally detectable in upper respiratory specimens during the acute phase of infection. The lowest concentration of SARS-CoV-2 viral copies this assay can detect is 138 copies/mL. A negative result does not preclude SARS-Cov-2 infection and should not be used as the sole basis for treatment or other patient management decisions. A negative result may occur with  improper specimen collection/handling, submission of specimen other than nasopharyngeal swab, presence of viral mutation(s) within the areas targeted by this assay, and inadequate number of viral copies(<138 copies/mL). A negative result must be combined with clinical observations, patient history, and epidemiological information. The expected result is Negative.  Fact Sheet for Patients:  BloggerCourse.com  Fact Sheet for Healthcare Providers:  SeriousBroker.it  This test is no t yet approved or cleared by the [place] FDA and  has been authorized for detection and/or diagnosis of SARS-CoV-2 by FDA under an Emergency Use Authorization (EUA). This EUA will remain  in effect (meaning this test can be used) for the duration of the COVID-19 declaration under Section 564(b)(1) of the Act, 21 U.S.C.section 360bbb-3(b)(1), unless the authorization is terminated  or revoked sooner.       Influenza A by PCR NEGATIVE NEGATIVE   Influenza B by PCR NEGATIVE NEGATIVE    Comment: (NOTE) The Xpert Xpress SARS-CoV-2/FLU/RSV plus assay is intended as an aid in the diagnosis of influenza from Nasopharyngeal swab specimens and should not be used as a sole basis for treatment. Nasal washings and aspirates are unacceptable for Xpert Xpress  SARS-CoV-2/FLU/RSV testing.  Fact Sheet for Patients: BloggerCourse.com  Fact Sheet for Healthcare Providers: SeriousBroker.it  This test is not yet approved or cleared by the [place] FDA and has been authorized for detection and/or diagnosis of SARS-CoV-2 by FDA under an Emergency Use Authorization (EUA). This EUA will remain in effect (meaning this test can be used) for the duration of the COVID-19 declaration under Section 564(b)(1) of the Act, 21 U.S.C. section 360bbb-3(b)(1), unless the authorization is terminated or revoked.  Performed at [facility], [address], [place], [place] [address]   Urine rapid drug screen (hosp performed)     Status: Abnormal   Collection Time: 05/08/22 12:05 PM  Result Value Ref Range   Opiates POSITIVE (A) NONE DETECTED   Cocaine NONE DETECTED NONE DETECTED   Benzodiazepines NONE DETECTED NONE DETECTED   Amphetamines NONE DETECTED NONE DETECTED   Tetrahydrocannabinol NONE DETECTED NONE DETECTED   Barbiturates NONE DETECTED NONE DETECTED    Comment: (NOTE) DRUG SCREEN FOR MEDICAL PURPOSES ONLY.  IF CONFIRMATION IS NEEDED FOR ANY PURPOSE, NOTIFY LAB WITHIN 5 DAYS.  LOWEST DETECTABLE LIMITS FOR URINE DRUG SCREEN Drug Class  Cutoff (ng/mL) Amphetamine and metabolites    1000 Barbiturate and metabolites    200 Benzodiazepine                 200 Tricyclics and metabolites     300 Opiates and metabolites        300 Cocaine and metabolites        300 THC                            50 Performed at [facility], [address], [place], [place] [address]   Urinalysis, Routine w reflex microscopic     Status: Abnormal   Collection Time: 05/08/22 12:05 PM  Result Value Ref Range   Color, Urine YELLOW YELLOW   APPearance CLEAR CLEAR   Specific Gravity, Urine 1.021 1.005 - 1.030   pH 7.0 5.0 - 8.0   Glucose, UA NEGATIVE NEGATIVE mg/dL   Hgb urine  dipstick NEGATIVE NEGATIVE   Bilirubin Urine NEGATIVE NEGATIVE   Ketones, ur NEGATIVE NEGATIVE mg/dL   Protein, ur 539 (A) NEGATIVE mg/dL   Nitrite NEGATIVE NEGATIVE   Leukocytes,Ua NEGATIVE NEGATIVE   RBC / HPF 0-5 0 - 5 RBC/hpf   WBC, UA 0-5 0 - 5 WBC/hpf   Bacteria, UA NONE SEEN NONE SEEN   Squamous Epithelial / LPF 0-5 0 - 5    Comment: Performed at [facility], [address] [place], [place] [address]  CBG monitoring, ED     Status: Abnormal   Collection Time: 05/08/22 12:05 PM  Result Value Ref Range   Glucose-Capillary 132 (H) 70 - 99 mg/dL    Comment: Glucose reference range applies only to samples taken after fasting for at least 8 hours.  I-stat chem 8, ED     Status: Abnormal   Collection Time: 05/08/22 12:10 PM  Result Value Ref Range   Sodium 136 135 - 145 mmol/L   Potassium 3.6 3.5 - 5.1 mmol/L   Chloride 101 98 - 111 mmol/L   BUN 15 8 - 23 mg/dL   Creatinine, Ser 1.93 0.44 - 1.00 mg/dL   Glucose, Bld 790 (H) 70 - 99 mg/dL    Comment: Glucose reference range applies only to samples taken after fasting for at least 8 hours.   Calcium, Ion 1.05 (L) 1.15 - 1.40 mmol/L   TCO2 22 22 - 32 mmol/L   Hemoglobin 13.9 12.0 - 15.0 g/dL   HCT 24.0 97.3 - 53.2 %  Ethanol     Status: None   Collection Time: 05/08/22 12:10 PM  Result Value Ref Range   Alcohol, Ethyl (B) <10 <10 mg/dL    Comment: (NOTE) Lowest detectable limit for serum alcohol is 10 mg/dL.  For medical purposes only. Performed at [facility], [address], [place], [place] [address]   Protime-INR     Status: None   Collection Time: 05/08/22 12:10 PM  Result Value Ref Range   Prothrombin Time 14.4 11.4 - 15.2 seconds   INR 1.1 0.8 - 1.2    Comment: (NOTE) INR goal varies based on device and disease states. Performed at [facility], [address] [place], [place] [address]   APTT     Status: None   Collection Time: 05/08/22 12:10 PM  Result Value Ref Range   aPTT 24 24 - 36  seconds    Comment: Performed at [facility], [address] [place], [place] [address]  CBC     Status: None   Collection Time: 05/08/22 12:10 PM  Result Value Ref Range   WBC 10.3 4.0 - 10.5 K/uL   RBC 4.23 3.87 - 5.11 MIL/uL   Hemoglobin 13.1 12.0 - 15.0 g/dL   HCT 60.4 54.0 - 98.1 %   MCV 92.7 80.0 - 100.0 fL   MCH 31.0 26.0 - 34.0 pg   MCHC 33.4 30.0 - 36.0 g/dL   RDW 19.1 47.8 - 29.5 %   Platelets 216 150 - 400 K/uL   nRBC 0.0 0.0 - 0.2 %    Comment: Performed at [facility], [address] [place], [place] [address]  Differential     Status: None   Collection Time: 05/08/22 12:10 PM  Result Value Ref Range   Neutrophils Relative % 74 %   Neutro Abs 7.7 1.7 - 7.7 K/uL   Lymphocytes Relative 15 %   Lymphs Abs 1.5 0.7 - 4.0 K/uL   Monocytes Relative 9 %   Monocytes Absolute 0.9 0.1 - 1.0 K/uL   Eosinophils Relative 1 %   Eosinophils Absolute 0.1 0.0 - 0.5 K/uL   Basophils Relative 0 %   Basophils Absolute 0.0 0.0 - 0.1 K/uL   Immature Granulocytes 1 %   Abs Immature Granulocytes 0.07 0.00 - 0.07 K/uL    Comment: Performed at [facility], [address], [place], [place] [address]  Comprehensive metabolic panel     Status: Abnormal   Collection Time: 05/08/22 12:10 PM  Result Value Ref Range   Sodium 137 135 - 145 mmol/L   Potassium 3.6 3.5 - 5.1 mmol/L   Chloride 102 98 - 111 mmol/L   CO2 22 22 - 32 mmol/L   Glucose, Bld 138 (H) 70 - 99 mg/dL    Comment: Glucose reference range applies only to samples taken after fasting for at least 8 hours.   BUN 13 8 - 23 mg/dL   Creatinine, Ser 4.69 0.44 - 1.00 mg/dL   Calcium 9.6 8.9 - 62.9 mg/dL   Total Protein 7.0 6.5 - 8.1 g/dL   Albumin 4.3 3.5 - 5.0 g/dL   AST 26 15 - 41 U/L   ALT 20 0 - 44 U/L   Alkaline Phosphatase 42 38 - 126 U/L   Total Bilirubin 0.7 0.3 - 1.2 mg/dL   GFR, Estimated 59 (L) >60 mL/min    Comment: (NOTE) Calculated using the CKD-EPI Creatinine Equation (2021)    Anion gap 13  5 - 15    Comment: Performed at [facility], [address], [place], [place] [address]  Troponin I (High Sensitivity)     Status: None   Collection Time: 05/08/22 12:15 PM  Result Value Ref Range   Troponin I (High Sensitivity) 13 <18 ng/L    Comment: (NOTE) Elevated high sensitivity troponin I (hsTnI) values and significant  changes across serial measurements may suggest ACS but many other  chronic and acute conditions are known to elevate hsTnI results.  Refer to the "Links" section for chest pain algorithms and additional  guidance. Performed at [facility], [address], [place], [place] [address]   Troponin I (High Sensitivity)     Status: None   Collection Time: 05/08/22  2:37 PM  Result Value Ref Range   Troponin I (High Sensitivity) 16 <18 ng/L    Comment: (NOTE) Elevated high sensitivity troponin I (hsTnI) values and  significant  changes across serial measurements may suggest ACS but many other  chronic and acute conditions are known to elevate hsTnI results.  Refer to the "Links" section for chest pain algorithms and additional  guidance. Performed at [facility], [address] [place], [place] [address]    MR BRAIN WO CONTRAST  Result Date: 05/08/2022 CLINICAL DATA:  Neuro deficit, acute, stroke suspected EXAM: MRI HEAD WITHOUT CONTRAST TECHNIQUE: Multiplanar, multiecho pulse sequences of the brain and surrounding structures were obtained without intravenous contrast. COMPARISON:  CT head from the same day.  MRI head 02/18/2021. FINDINGS: Motion limited study. Brain: No acute infarction, hemorrhage, hydrocephalus, extra-axial collection or mass lesion. Remote left cerebellar and right frontal infarcts. Additional moderate patchy and confluent T2/FLAIR hyperintensity within the white matter, nonspecific but compatible with chronic microvascular disease. Vascular: Major arterial flow voids are maintained skull base. Skull and upper cervical spine:  Normal marrow signal. Sinuses/Orbits: Largely clear sinuses.  No acute orbital findings. Other: No sizable mastoid effusion IMPRESSION: 1. No evidence of acute intracranial abnormality. 2. 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 Electronically Signed   By: Feliberto Harts M.D.   On: 05/08/2022 16:08   CT ANGIO HEAD NECK W WO CM (CODE STROKE)  Result Date: 05/08/2022 CLINICAL DATA:  Neuro deficit, acute, stroke suspected EXAM: CT ANGIOGRAPHY HEAD AND NECK TECHNIQUE: Multidetector CT imaging of the head and neck was performed using the standard protocol during bolus administration of intravenous contrast. Multiplanar CT image reconstructions and MIPs were obtained to evaluate the vascular anatomy. Carotid stenosis measurements (when applicable) are obtained utilizing NASCET criteria, using the distal internal carotid diameter as the denominator. RADIATION DOSE REDUCTION: This exam was performed according to the departmental dose-optimization program which includes automated exposure control, adjustment of the mA and/or kV according to patient size and/or use of iterative reconstruction technique. CONTRAST:  75 mL Omnipaque 350 COMPARISON:  None Available. FINDINGS: CTA NECK Aortic arch: Mild plaque along the arch and patent great vessel origins. Right carotid system: Patent. Partially retropharyngeal course. Calcified plaque at the distal common carotid, bifurcation, and proximal internal carotid with less than 50% stenosis. Left carotid system: Common carotid is patent. Calcified plaque at the bifurcation. Noncalcified plaque just beyond the ICA origin with occlusion. No reconstitution in the neck. Vertebral arteries: Patent. Right vertebral is mildly dominant. No stenosis. Skeleton: Cervical spine degenerative changes. Other neck: Unremarkable. Upper chest: No apical lung mass. Review of the MIP images confirms the above findings CTA HEAD Anterior  circulation: Intracranial right internal carotid artery is patent with calcified plaque but no significant stenosis. Partial reconstitution of the left ICA at the level of the clinoid. Noncalcified plaque along the supraclinoid portion causing marked stenosis. Anterior and middle cerebral arteries are patent with mild atherosclerotic irregularity. Posterior circulation: Intracranial vertebral arteries are patent. Basilar artery patent. Major cerebellar artery origins are patent. Bilateral posterior communicating arteries are present. 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 Venous sinuses: Patent as allowed by contrast bolus timing. 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 Partial reconstitution intracranially at the clinoid. Noncalcified plaque causes up to marked stenosis of the supraclinoid portion. This is age-indeterminate though likely new from 2022 cervical spine MRI. Less than 50% stenosis at the right ICA origin. 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 Preliminary results were  communicated to Dr. Otelia Limes at 12:36 pm on 05/08/2022 by text page via the [facility] messaging system. Electronically Signed   By: Guadlupe Spanish M.D.   On: 05/08/2022 12:44   CT HEAD CODE STROKE WO CONTRAST  Result Date: 05/08/2022 CLINICAL DATA:  Code stroke.  Neuro deficit, acute, stroke suspected EXAM: CT HEAD WITHOUT CONTRAST TECHNIQUE: Contiguous axial images were obtained from the base of the skull through the vertex without intravenous contrast. RADIATION DOSE REDUCTION: This exam was performed according to the departmental dose-optimization program which includes automated exposure control, adjustment of the mA and/or kV according to patient size and/or use of iterative reconstruction technique. COMPARISON:  10/06/2020 FINDINGS:  Brain: No acute intracranial hemorrhage, mass effect, or edema. No acute appearing loss of gray-white differentiation. Chronic infarcts of the parasagittal right frontal lobe and left cerebellum. Additional patchy and confluent areas of low-density in the supratentorial white matter nonspecific but may reflect moderate to advanced chronic microvascular ischemic changes. Prominence of the ventricles and sulci reflects parenchymal volume loss. Vascular: No hyperdense vessel. There is intracranial atherosclerotic calcification at the skull base. Skull: Unremarkable. Sinuses/Orbits: No acute finding. Other: Mastoid air cells are clear. ASPECTS ([place]berta Stroke Program Early CT Score) - Ganglionic level infarction (caudate, lentiform nuclei, internal capsule, insula, M1-M3 cortex): 7 - Supraganglionic infarction (M4-M6 cortex): 3 Total score (0-10 with 10 being normal): 10 IMPRESSION: There is no acute intracranial hemorrhage or evidence of acute infarction. ASPECT score is 10. Chronic infarcts and chronic microvascular ischemic changes. These results were communicated to Dr. Otelia Limes at 12:19 pm on 05/08/2022 by text page via the [facility] messaging system. Electronically Signed   By: Guadlupe Spanish M.D.   On: 05/08/2022 12:23    Pending Labs Unresulted Labs (From admission, onward)    None       Vitals/Pain Today's Vitals   05/08/22 1415 05/08/22 1430 05/08/22 1443 05/08/22 1504  BP: (!) 160/102 (!) 179/94  (!) 156/98  Pulse: (!) 103 95  100  Resp: 19 14  20   Temp:   98.2 F (36.8 C)   TempSrc:   Oral   SpO2: 90% (!) 89%  95%  Weight:      PainSc:        Isolation Precautions No active isolations  Medications Medications  iohexol (OMNIPAQUE) 350 MG/ML injection 75 mL (75 mLs Intravenous Contrast Given 05/08/22 1239)  ondansetron (ZOFRAN) injection 4 mg (4 mg Intravenous Given 05/08/22 1408)  midazolam (VERSED) injection 0.5 mg (0.5 mg Intravenous Given 05/08/22 1503)    Mobility walks with person  assist High fall risk   Focused Assessments Neuro Assessment Handoff:  Swallow screen pass? No    NIH Stroke Scale ( + Modified Stroke Scale Criteria)  Interval: Initial Level of Consciousness (1a.)   : Alert, keenly responsive LOC Questions (1b. )   +: Answers both questions correctly LOC Commands (1c. )   + : Performs both tasks correctly Best Gaze (2. )  +: Normal Visual (3. )  +: No visual loss Facial Palsy (4. )    : Normal symmetrical movements Motor Arm, Left (5a. )   +: No drift Motor Arm, Right (5b. )   +: No drift Motor Leg, Left (6a. )   +: No drift Motor Leg, Right (6b. )   +: No drift Limb Ataxia (7. ): Absent Sensory (8. )   +: Normal, no sensory loss Best Language (9. )   +: No aphasia Dysarthria (10. ): Mild-to-moderate dysarthria, patient slurs  at least some words and, at worst, can be understood with some difficulty Extinction/Inattention (11.)   +: No Abnormality Modified SS Total  +: 0 Complete NIHSS TOTAL: 2 Last date known well: 05/08/22 Last time known well: 1050 Neuro Assessment:   Neuro Checks:   Initial (05/08/22 1215)  Last Documented NIHSS Modified Score: 0 (05/08/22 1315) Has TPA been given? No If patient is a Neuro Trauma and patient is going to OR before floor call report to 4N Charge nurse: [phone] or [phone]   R Recommendations: See Admitting Provider Note  Report given to:   Additional Notes:

## 2022-05-08 NOTE — Assessment & Plan Note (Addendum)
Continue norco BID prn  pmp website verified and fills correctly

## 2022-05-08 NOTE — Assessment & Plan Note (Signed)
Optimize electrolytes Keep on telemetry Avoid qt prolonging drugs  Repeat ekg in AM   

## 2022-05-08 NOTE — Assessment & Plan Note (Addendum)
Rate controlled Continue toprol Restart eliquis

## 2022-05-08 NOTE — ED Notes (Signed)
Patient transported to MRI 

## 2022-05-08 NOTE — Assessment & Plan Note (Addendum)
81 year old presenting with witnessed episode of seizure like activity followed by facial droop and slurred speech that resolved while in ED. 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 Partial reconstitution intracranially at the clinoid. Noncalcified plaque causes up to marked stenosis of the supraclinoid portion. Per neurology, discussed with neuro IR -US carotid  -continue ASA, restart her eliquis  -echo -lipid panel and a1C  -EEG and seizure precautions  -N.p.o. until bedside swallow screen -PT/ OT/ SLP consult

## 2022-05-08 NOTE — ED Provider Notes (Signed)
[facility] EMERGENCY DEPARTMENT Provider Note   CSN: [id number] Arrival date & time: 05/08/22  1203  An emergency department physician performed an initial assessment on this suspected stroke patient at 1205.  History  Chief Complaint  Patient presents with   Code Stroke    Carmen [name] is a 81 y.o. female.  HPI Patient presents for strokelike symptoms.  Medical history includes chronic pain, CVA, depression, PVD, hypothyroidism, anemia, CAD, and anxiety. She resides in a nursing facility.  Her daughter came to the facility this morning and plan was for patient to stay with her daughter this week.  While they were packing up, patient became stiff, unresponsive, and reportedly had foaming at the mouth.  After this, she had a facial droop, right-sided weakness, and slurred speech.  Patient has history of CVA.  She is on Eliquis.  She arrives as a code stroke.  Currently, patient denies any areas of pain.    Home Medications Prior to Admission medications   Medication Sig Start Date End Date Taking? Authorizing Provider  apixaban (ELIQUIS) 5 MG TABS tablet Take 1 tablet (5 mg total) by mouth 2 (two) times daily. 03/17/22  Yes End, Cristal Deer, MD  aspirin EC 81 MG tablet Take 81 mg by mouth daily. Swallow whole.   Yes [provider]  atorvastatin (LIPITOR) 40 MG tablet Take 1 tablet (40 mg total) by mouth daily. 03/17/22 09/13/22 Yes End, Cristal Deer, MD  calcium carbonate (OSCAL) 1500 (600 Ca) MG TABS tablet Take 1 tablet (1,500 mg total) by mouth 2 (two) times daily with a meal. 05/02/21  Yes Delano Metz, MD  cetirizine (ZYRTEC) 10 MG tablet Take 10 mg by mouth daily.   Yes [provider]  CHOLECALCIFEROL PO Take 1 capsule by mouth daily.   Yes [provider]  clonazePAM (KLONOPIN) 0.5 MG tablet TAKE 1 TABLET BY MOUTH EVERY DAY AS NEEDED FOR ANXIETY 03/30/22  Yes Dugal, Tabitha, FNP  hydrochlorothiazide (HYDRODIURIL) 25 MG tablet Take 1  tablet (25 mg total) by mouth daily. 03/17/22  Yes End, Cristal Deer, MD  HYDROcodone-acetaminophen (NORCO/VICODIN) 5-325 MG tablet Take 1 tablet by mouth 2 (two) times daily as needed for severe pain. Must last 30 days. 04/30/22 05/30/22 Yes Delano Metz, MD  levothyroxine (SYNTHROID) 100 MCG tablet Take 1 tablet (100 mcg total) by mouth daily before breakfast. 11/18/21  Yes [name], [name], FNP  metoprolol succinate (TOPROL-XL) 100 MG 24 hr tablet Take 0.5 tablets (50 mg total) by mouth daily. 03/17/22  Yes End, Cristal Deer, MD  traZODone (DESYREL) 100 MG tablet Take 1 tablet (100 mg total) by mouth at bedtime as needed for sleep. for sleep 04/14/22  Yes Dugal, Wyatt Mage, FNP  HYDROcodone-acetaminophen (NORCO/VICODIN) 5-325 MG tablet Take 1 tablet by mouth 2 (two) times daily as needed for severe pain. Must last 30 days. 05/30/22 06/29/22  Delano Metz, MD  HYDROcodone-acetaminophen (NORCO/VICODIN) 5-325 MG tablet Take 1 tablet by mouth 2 (two) times daily as needed for severe pain. Must last 30 days. 06/29/22 07/29/22  Delano Metz, MD      Allergies    Patient has no known allergies.    Review of Systems   Review of Systems  Neurological:  Positive for facial asymmetry, speech difficulty and weakness.       Seizure-like activity  All other systems reviewed and are negative.   Physical Exam Updated Vital Signs BP (!) 158/101   Pulse 98   Temp 97.9 F (36.6 C) (Oral)   Resp  13   Wt 86.2 kg   SpO2 97%   BMI 35.91 kg/m  Physical Exam Vitals and nursing note reviewed.  Constitutional:      General: She is not in acute distress.    Appearance: Normal appearance. She is well-developed. She is ill-appearing (Chronically). She is not toxic-appearing or diaphoretic.  HENT:     Head: Normocephalic and atraumatic.     Right Ear: External ear normal.     Left Ear: External ear normal.     Nose: Nose normal.     Mouth/Throat:     Mouth: Mucous membranes are moist.      Pharynx: Oropharynx is clear.  Eyes:     Extraocular Movements: Extraocular movements intact.     Conjunctiva/sclera: Conjunctivae normal.  Cardiovascular:     Rate and Rhythm: Normal rate. Rhythm irregular.     Heart sounds: No murmur heard. Pulmonary:     Effort: Pulmonary effort is normal. No respiratory distress.     Breath sounds: Normal breath sounds. No wheezing or rales.  Abdominal:     General: There is no distension.     Palpations: Abdomen is soft.     Tenderness: There is no abdominal tenderness.  Musculoskeletal:        General: No swelling. Normal range of motion.     Cervical back: Normal range of motion and neck supple.  Skin:    General: Skin is warm and dry.     Capillary Refill: Capillary refill takes less than 2 seconds.  Neurological:     General: No focal deficit present.     Mental Status: She is alert.     Cranial Nerves: No cranial nerve deficit.     Sensory: No sensory deficit.     Motor: No weakness.     Coordination: Coordination normal.  Psychiatric:        Mood and Affect: Mood normal.        Behavior: Behavior normal.     ED Results / Procedures / Treatments   Labs (all labs ordered are listed, but only abnormal results are displayed) Labs Reviewed  COMPREHENSIVE METABOLIC PANEL - Abnormal; Notable for the following components:      Result Value   Glucose, Bld 138 (*)    GFR, Estimated 59 (*)    All other components within normal limits  RAPID URINE DRUG SCREEN, HOSP PERFORMED - Abnormal; Notable for the following components:   Opiates POSITIVE (*)    All other components within normal limits  URINALYSIS, ROUTINE W REFLEX MICROSCOPIC - Abnormal; Notable for the following components:   Protein, ur 100 (*)    All other components within normal limits  I-STAT CHEM 8, ED - Abnormal; Notable for the following components:   Glucose, Bld 135 (*)    Calcium, Ion 1.05 (*)    All other components within normal limits  CBG MONITORING, ED -  Abnormal; Notable for the following components:   Glucose-Capillary 132 (*)    All other components within normal limits  RESP PANEL BY RT-PCR (FLU A&B, COVID) ARPGX2  ETHANOL  PROTIME-INR  APTT  CBC  DIFFERENTIAL  LIPID PANEL  HEMOGLOBIN A1C  TROPONIN I (HIGH SENSITIVITY)  TROPONIN I (HIGH SENSITIVITY)    EKG None  Radiology MR BRAIN WO CONTRAST  Result Date: 05/08/2022 CLINICAL DATA:  Neuro deficit, acute, stroke suspected EXAM: MRI HEAD WITHOUT CONTRAST TECHNIQUE: Multiplanar, multiecho pulse sequences of the brain and surrounding structures were obtained without intravenous contrast. COMPARISON:  CT head from the same day.  MRI head 02/18/2021. FINDINGS: Motion limited study. Brain: No acute infarction, hemorrhage, hydrocephalus, extra-axial collection or mass lesion. Remote left cerebellar and right frontal infarcts. Additional moderate patchy and confluent T2/FLAIR hyperintensity within the white matter, nonspecific but compatible with chronic microvascular disease. Vascular: Major arterial flow voids are maintained skull base. Skull and upper cervical spine: Normal marrow signal. Sinuses/Orbits: Largely clear sinuses.  No acute orbital findings. Other: No sizable mastoid effusion IMPRESSION: 1. No evidence of acute intracranial abnormality. 2. 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 Electronically Signed   By: Feliberto HartsFrederick S Jones M.D.   On: 05/08/2022 16:08   CT ANGIO HEAD NECK W WO CM (CODE STROKE)  Result Date: 05/08/2022 CLINICAL DATA:  Neuro deficit, acute, stroke suspected EXAM: CT ANGIOGRAPHY HEAD AND NECK TECHNIQUE: Multidetector CT imaging of the head and neck was performed using the standard protocol during bolus administration of intravenous contrast. Multiplanar CT image reconstructions and MIPs were obtained to evaluate the vascular anatomy. Carotid stenosis measurements (when applicable) are obtained utilizing NASCET  criteria, using the distal internal carotid diameter as the denominator. RADIATION DOSE REDUCTION: This exam was performed according to the departmental dose-optimization program which includes automated exposure control, adjustment of the mA and/or kV according to patient size and/or use of iterative reconstruction technique. CONTRAST:  75 mL Omnipaque 350 COMPARISON:  None Available. FINDINGS: CTA NECK Aortic arch: Mild plaque along the arch and patent great vessel origins. Right carotid system: Patent. Partially retropharyngeal course. Calcified plaque at the distal common carotid, bifurcation, and proximal internal carotid with less than 50% stenosis. Left carotid system: Common carotid is patent. Calcified plaque at the bifurcation. Noncalcified plaque just beyond the ICA origin with occlusion. No reconstitution in the neck. Vertebral arteries: Patent. Right vertebral is mildly dominant. No stenosis. Skeleton: Cervical spine degenerative changes. Other neck: Unremarkable. Upper chest: No apical lung mass. Review of the MIP images confirms the above findings CTA HEAD Anterior circulation: Intracranial right internal carotid artery is patent with calcified plaque but no significant stenosis. Partial reconstitution of the left ICA at the level of the clinoid. Noncalcified plaque along the supraclinoid portion causing marked stenosis. Anterior and middle cerebral arteries are patent with mild atherosclerotic irregularity. Posterior circulation: Intracranial vertebral arteries are patent. Basilar artery patent. Major cerebellar artery origins are patent. Bilateral posterior communicating arteries are present. 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 Venous sinuses: Patent as allowed by contrast bolus timing. 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 Partial  reconstitution intracranially at the clinoid. Noncalcified plaque causes up to marked stenosis of the supraclinoid portion. This is age-indeterminate though likely new from 2022 cervical spine MRI. Less than 50% stenosis at the right ICA origin. 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 Preliminary results were communicated to Dr. Otelia LimesLindzen at 12:36 pm on 05/08/2022 by text page via the [facility] messaging system. Electronically Signed   By: Guadlupe SpanishPraneil  Patel M.D.   On: 05/08/2022 12:44   CT HEAD CODE STROKE WO CONTRAST  Result Date: 05/08/2022 CLINICAL DATA:  Code stroke.  Neuro deficit, acute, stroke suspected EXAM: CT HEAD WITHOUT CONTRAST TECHNIQUE: Contiguous axial images were obtained from the base of the skull through the vertex without intravenous contrast. RADIATION DOSE REDUCTION: This exam was performed according to the departmental dose-optimization program which includes  automated exposure control, adjustment of the mA and/or kV according to patient size and/or use of iterative reconstruction technique. COMPARISON:  10/06/2020 FINDINGS: Brain: No acute intracranial hemorrhage, mass effect, or edema. No acute appearing loss of gray-white differentiation. Chronic infarcts of the parasagittal right frontal lobe and left cerebellum. Additional patchy and confluent areas of low-density in the supratentorial white matter nonspecific but may reflect moderate to advanced chronic microvascular ischemic changes. Prominence of the ventricles and sulci reflects parenchymal volume loss. Vascular: No hyperdense vessel. There is intracranial atherosclerotic calcification at the skull base. Skull: Unremarkable. Sinuses/Orbits: No acute finding. Other: Mastoid air cells are clear. ASPECTS ([place] Stroke Program Early CT Score) - Ganglionic level infarction (caudate, lentiform nuclei, internal capsule, insula, M1-M3 cortex): 7 - Supraganglionic infarction (M4-M6 cortex): 3 Total  score (0-10 with 10 being normal): 10 IMPRESSION: There is no acute intracranial hemorrhage or evidence of acute infarction. ASPECT score is 10. Chronic infarcts and chronic microvascular ischemic changes. These results were communicated to Dr. Otelia Limes at 12:19 pm on 05/08/2022 by text page via the [facility] messaging system. Electronically Signed   By: Guadlupe Spanish M.D.   On: 05/08/2022 12:23    Procedures Procedures    Medications Ordered in ED Medications  aspirin EC tablet 81 mg (has no administration in time range)  HYDROcodone-acetaminophen (NORCO/VICODIN) 5-325 MG per tablet 1 tablet (has no administration in time range)  atorvastatin (LIPITOR) tablet 40 mg (has no administration in time range)  hydrochlorothiazide (HYDRODIURIL) tablet 25 mg (has no administration in time range)  metoprolol succinate (TOPROL-XL) 24 hr tablet 50 mg (has no administration in time range)  traZODone (DESYREL) tablet 100 mg (has no administration in time range)  levothyroxine (SYNTHROID) tablet 100 mcg (has no administration in time range)  loratadine (CLARITIN) tablet 10 mg (has no administration in time range)   stroke: early stages of recovery book (has no administration in time range)  acetaminophen (TYLENOL) tablet 650 mg (has no administration in time range)    Or  acetaminophen (TYLENOL) 160 MG/5ML solution 650 mg (has no administration in time range)    Or  acetaminophen (TYLENOL) suppository 650 mg (has no administration in time range)  senna-docusate (Senokot-S) tablet 1 tablet (has no administration in time range)  enoxaparin (LOVENOX) injection 40 mg (has no administration in time range)  iohexol (OMNIPAQUE) 350 MG/ML injection 75 mL (75 mLs Intravenous Contrast Given 05/08/22 1239)  ondansetron (ZOFRAN) injection 4 mg (4 mg Intravenous Given 05/08/22 1408)  midazolam (VERSED) injection 0.5 mg (0.5 mg Intravenous Given 05/08/22 1503)    ED Course/ Medical Decision Making/ A&P                            Medical Decision Making Amount and/or Complexity of Data Reviewed Labs: ordered. Radiology: ordered.  Risk Decision regarding hospitalization.   This patient presents to the ED for concern of strokelike symptoms, this involves an extensive number of treatment options, and is a complaint that carries with it a high risk of complications and morbidity.  The differential diagnosis includes CVA, seizure, TIA, complex migraine, polypharmacy, hypoglycemia, infection, metabolic derangements   Co morbidities that complicate the patient evaluation  chronic pain, CVA, depression, PVD, hypothyroidism, anemia, CAD, and anxiety   Additional history obtained:  Additional history obtained from patient's daughter, patient's son-in-law External records from outside source obtained and reviewed including EMR   Lab Tests:  I Ordered, and personally interpreted labs.  The  pertinent results include: Normal glucose, normal electrolytes, no leukocytosis   Imaging Studies ordered:  I ordered imaging studies including CT head, CTA head and neck I independently visualized and interpreted imaging which showed occlusion of left cervical ICA with partial reconstitution intracranially at the clinoid.  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  I personally viewed and interpreted the cardiac monitored which showed an underlying rhythm of: Atrial fibrillation   Consultations Obtained:  I requested consultation with the neurologist,  and discussed lab and imaging findings as well as pertinent plan - they recommend: Admission for stroke and seizure workup   Problem List / ED Course / Critical interventions / Medication management  Patient is 82 year old female who presents for strokelike symptoms.  This was witnessed by her daughter at patient's nursing facility.  The patient  had a facial asymmetry, difficulty with speech, and hemibody weakness.  She arrives as a code stroke.  On arrival in the ED, patient was taken directly to CT scanner.  She underwent CT of head and CTA of head and neck.  On initial assessment, patient appears to have resolution of facial asymmetry.  She does not have any appreciable areas of focal weakness.  History and exam is limited by patient's hearing impairment.  Neurology team obtained corroborating information from patient's daughter.  Patient's daughter describes a episode of stiffening prior to onset of symptoms.  This raises concern of possible seizure.  Patient has no history of seizures.  Results of laboratory workup were unremarkable.  Neurology recommends admission for stroke and seizure workup.  Patient was admitted to hospitalist for further management. I ordered medication including Zofran for nausea Reevaluation of the patient after these medicines showed that the patient improved I have reviewed the patients home medicines and have made adjustments as needed   Social Determinants of Health:  Lives in nursing facility        Final Clinical Impression(s) / ED Diagnoses Final diagnoses:  Stroke-like symptoms    Rx / DC Orders ED Discharge Orders     None         Gloris Manchester, MD 05/08/22 1759

## 2022-05-08 NOTE — Code Documentation (Addendum)
Stroke Response Nurse Documentation Code Documentation  Carmen [name] is a 81 y.o. female arriving to [facility]  via [facility] on 05/08/22 with past medical hx of CVA, afib on Eliquis, HTN, CAD. On  Code stroke was activated by EMS.   Patient from home with family. Daughter at home with patient helping to pack up planned procedure on Thursday. Per daughter over phone, witnessed pt sitting in chair, stiffened up, slid onto floor and "foaming at mouth", LKW 1050.   Stroke team at the bedside on patient arrival. Labs drawn and patient cleared for CT by EDP. Patient to CT with team. NIHSS 2, see documentation for details and code stroke times. Patient with disoriented and dysarthria  on exam.   The following imaging was completed:  CT Head and CTA. Patient is not a candidate for IV Thrombolytic due to Eliquis. Patient is not a candidate for IR due to low NIH and suspected seizure.   Care Plan: q2h mNIHSS and vitals. Permissive HTN.   Bedside handoff with ED RN [place].    Scarlette Slice K  Stroke Response RN

## 2022-05-08 NOTE — Progress Notes (Signed)
Per rn pt is moving to 3w27

## 2022-05-08 NOTE — Assessment & Plan Note (Signed)
[facility] wnl 10/2021 Continue home synthroid

## 2022-05-08 NOTE — H&P (Signed)
History and Physical    Patient: Carmen [name] ZOX:[id number] DOB: 08/30/1941 DOA: 05/08/2022 DOS: the patient was seen and examined on 05/08/2022 PCP: Mort Sawyersugal, Tabitha, FNP  Patient coming from: [place]:  [facility].    Chief Complaint: seizure like activity and facial droop   HPI: Carmen [name] is a 81 y.o. female with medical history significant of chronic pain, CAD, HTN, PVD, hx of TIA and CVA, thyroid disease, PAF on eliquis, depression/anxiety who presented to Ed with complaints of a witnessed episode of stiffening up and becoming unresponsive. She then had slurred speech and dysarthria. Symptoms have cleared up. Family is here for history. Her daughter was packing up her things around 10:15 to 10:30 and all of a sudden made weird faces and did some stuff with her legs. She grabbed her and went down to the floor. She was foaming at the mouth and grunting. She had some shaking and was very stiff. She didn't respond to her at all. She was nauseated before all of this happened. She didn't have tongue biting or urinary incontinence.  When she was getting ready to leave with the ambulance she had facial drooping. Resolved when she got to ED.   Family also states she has had a noticeable decline in her short term memory over the last 3 weeks.   Denies any fever/chills, vision changes/headaches, chest pain or palpitations, shortness of breath or cough, abdominal pain, V/D, some dysuria, no leg swelling.    She does not smoke or drink.   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 Partial reconstitution intracranially at the clinoid. Noncalcified plaque causes up to marked stenosis of the supraclinoid portion. This is age-indeterminate though likely new from 2022 cervical spine MRI. 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 In ED: code stroke, neurology consulted. [facility] asked to admit.    Review of Systems: As mentioned in the history of present illness. All other systems reviewed and are negative. Past Medical History:  Diagnosis Date   Abnormal CT scan, lumbar spine (05/11/2021) 05/17/2021   (05/11/2021) LUMBAR CT FINDINGS: Alignment: Lumbar levocurvature, apex L4. Mild lateral listhesis L4 on L5 of approximately 3 mm. Vertebrae: Remote appearing superior endplate deformities at T12 with 10% height loss and L1 with up to 20% height loss. Multilevel discogenic and facet degenerative changes. Mild bilateral SI joint arthrosis.  DISC LEVELS: T11-T12: Near complete disc height loss with de   Abnormal MRI, cervical spine (05/12/2021) 05/17/2021   (05/12/2021) CERVICAL MRI FINDINGS: Motion artifact is present. Posterior Fossa, vertebral arteries, paraspinal tissues: Left superior cerebellar infarct.   DISC LEVELS: C2-C3: Disc bulge with endplate osteophytes. Uncovertebral and facet hypertrophy. C3-C4: Disc bulge with endplate osteophytes. Uncovertebral and facet hypertrophy. Mild canal stenosis. Marked foraminal stenosis. 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 10/02/2020   Stroke [facility](HCC)    Thyroid disease    Past Surgical History:  Procedure Laterality Date   back injections     CARDIAC CATHETERIZATION  CATARACT EXTRACTION     CORONARY ANGIOPLASTY     CORONARY ARTERY BYPASS GRAFT     2005   Social History:  reports that she quit smoking about 2 years ago. Her smoking use included cigarettes. She has never used smokeless tobacco. She reports that she does not currently use alcohol. She reports that she does not use drugs.  No Known  Allergies  Family History  Problem Relation Age of Onset   Breast cancer Mother    Heart attack Father    Bipolar disorder Daughter    Diabetes Mellitus II Neg Hx     Prior to Admission medications   Medication Sig Start Date End Date Taking? Authorizing Provider  apixaban (ELIQUIS) 5 MG TABS tablet Take 1 tablet (5 mg total) by mouth 2 (two) times daily. 03/17/22  Yes End, Cristal Deer, MD  aspirin EC 81 MG tablet Take 81 mg by mouth daily. Swallow whole.   Yes [provider]  atorvastatin (LIPITOR) 40 MG tablet Take 1 tablet (40 mg total) by mouth daily. 03/17/22 09/13/22 Yes End, Cristal Deer, MD  calcium carbonate (OSCAL) 1500 (600 Ca) MG TABS tablet Take 1 tablet (1,500 mg total) by mouth 2 (two) times daily with a meal. 05/02/21  Yes Delano Metz, MD  cetirizine (ZYRTEC) 10 MG tablet Take 10 mg by mouth daily.   Yes [provider]  CHOLECALCIFEROL PO Take 1 capsule by mouth daily.   Yes [provider]  clonazePAM (KLONOPIN) 0.5 MG tablet TAKE 1 TABLET BY MOUTH EVERY DAY AS NEEDED FOR ANXIETY 03/30/22  Yes Dugal, Tabitha, FNP  hydrochlorothiazide (HYDRODIURIL) 25 MG tablet Take 1 tablet (25 mg total) by mouth daily. 03/17/22  Yes End, Cristal Deer, MD  HYDROcodone-acetaminophen (NORCO/VICODIN) 5-325 MG tablet Take 1 tablet by mouth 2 (two) times daily as needed for severe pain. Must last 30 days. 04/30/22 05/30/22 Yes Delano Metz, MD  levothyroxine (SYNTHROID) 100 MCG tablet Take 1 tablet (100 mcg total) by mouth daily before breakfast. 11/18/21  Yes [name], [name], FNP  metoprolol succinate (TOPROL-XL) 100 MG 24 hr tablet Take 0.5 tablets (50 mg total) by mouth daily. 03/17/22  Yes End, Cristal Deer, MD  traZODone (DESYREL) 100 MG tablet Take 1 tablet (100 mg total) by mouth at bedtime as needed for sleep. for sleep 04/14/22  Yes Dugal, Wyatt Mage, FNP  HYDROcodone-acetaminophen (NORCO/VICODIN) 5-325 MG tablet Take 1 tablet by mouth 2 (two) times daily as  needed for severe pain. Must last 30 days. 05/30/22 06/29/22  Delano Metz, MD  HYDROcodone-acetaminophen (NORCO/VICODIN) 5-325 MG tablet Take 1 tablet by mouth 2 (two) times daily as needed for severe pain. Must last 30 days. 06/29/22 07/29/22  Delano Metz, MD    Physical Exam: Vitals:   05/08/22 1703 05/08/22 1800 05/08/22 1853 05/08/22 1957  BP: (!) 158/101 (!) 166/104  (!) 159/90  Pulse: 98 89  82  Resp: 13   14  Temp: 97.9 F (36.6 C) 97.7 F (36.5 C)  98.6 F (37 C)  TempSrc: Oral Oral  Oral  SpO2: 97% 99%  98%  Weight:      Height:   5\' 1"  (1.549 m)    General:  Appears calm and comfortable and is in NAD Eyes:  PERRL, EOMI, normal lids, iris ENT:  grossly normal hearing, lips & tongue, mmm; appropriate dentition Neck:  no LAD, masses or thyromegaly; no carotid bruits Cardiovascular:  regular, irregular, no m/r/g. trace LE edema.  Respiratory:   CTA bilaterally with no wheezes/rales/rhonchi.  Normal respiratory effort. Abdomen:  soft, NT, ND, NABS Back:   normal alignment, no CVAT Skin:  no rash or induration seen on limited exam Musculoskeletal:  grossly normal tone BUE/BLE, good ROM, no bony abnormality Lower extremity:   Limited foot exam with no ulcerations.  2+ distal pulses. Psychiatric:  grossly normal mood and affect, speech fluent and appropriate, AOx2 Neurologic:  CN 2-12 grossly intact, moves all extremities in coordinated fashion, sensation intact. HTK intact bilaterally. FTN intact bilaterally. Negative pronator drift. Gait deferred.    Radiological Exams on Admission: Independently reviewed - see discussion in A/P where applicable  MR BRAIN WO CONTRAST  Result Date: 05/08/2022 CLINICAL DATA:  Neuro deficit, acute, stroke suspected EXAM: MRI HEAD WITHOUT CONTRAST TECHNIQUE: Multiplanar, multiecho pulse sequences of the brain and surrounding structures were obtained without intravenous contrast. COMPARISON:  CT head from the same day.  MRI head  02/18/2021. FINDINGS: Motion limited study. Brain: No acute infarction, hemorrhage, hydrocephalus, extra-axial collection or mass lesion. Remote left cerebellar and right frontal infarcts. Additional moderate patchy and confluent T2/FLAIR hyperintensity within the white matter, nonspecific but compatible with chronic microvascular disease. Vascular: Major arterial flow voids are maintained skull base. Skull and upper cervical spine: Normal marrow signal. Sinuses/Orbits: Largely clear sinuses.  No acute orbital findings. Other: No sizable mastoid effusion IMPRESSION: 1. No evidence of acute intracranial abnormality. 2. 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 Electronically Signed   By: Feliberto Harts M.D.   On: 05/08/2022 16:08   CT ANGIO HEAD NECK W WO CM (CODE STROKE)  Result Date: 05/08/2022 CLINICAL DATA:  Neuro deficit, acute, stroke suspected EXAM: CT ANGIOGRAPHY HEAD AND NECK TECHNIQUE: Multidetector CT imaging of the head and neck was performed using the standard protocol during bolus administration of intravenous contrast. Multiplanar CT image reconstructions and MIPs were obtained to evaluate the vascular anatomy. Carotid stenosis measurements (when applicable) are obtained utilizing NASCET criteria, using the distal internal carotid diameter as the denominator. RADIATION DOSE REDUCTION: This exam was performed according to the departmental dose-optimization program which includes automated exposure control, adjustment of the mA and/or kV according to patient size and/or use of iterative reconstruction technique. CONTRAST:  75 mL Omnipaque 350 COMPARISON:  None Available. FINDINGS: CTA NECK Aortic arch: Mild plaque along the arch and patent great vessel origins. Right carotid system: Patent. Partially retropharyngeal course. Calcified plaque at the distal common carotid, bifurcation, and proximal internal carotid with less than 50% stenosis. Left  carotid system: Common carotid is patent. Calcified plaque at the bifurcation. Noncalcified plaque just beyond the ICA origin with occlusion. No reconstitution in the neck. Vertebral arteries: Patent. Right vertebral is mildly dominant. No stenosis. Skeleton: Cervical spine degenerative changes. Other neck: Unremarkable. Upper chest: No apical lung mass. Review of the MIP images confirms the above findings CTA HEAD Anterior circulation: Intracranial right internal carotid artery is patent with calcified plaque but no significant stenosis. Partial reconstitution of the left ICA at the level of the clinoid. Noncalcified plaque along the supraclinoid portion causing marked stenosis. Anterior and middle cerebral arteries are patent with mild atherosclerotic irregularity. Posterior circulation: Intracranial vertebral arteries are patent. Basilar artery patent. Major cerebellar artery origins are patent. Bilateral posterior communicating arteries are present. 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 Venous sinuses: Patent as allowed by contrast bolus timing. 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 Partial reconstitution intracranially at the clinoid. Noncalcified plaque causes up to marked stenosis of the supraclinoid portion. This is age-indeterminate though likely new from 2022 cervical spine MRI. Less than 50% stenosis at the right ICA origin. 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 Preliminary results were communicated to Dr. Otelia Limes at 12:36 pm on 05/08/2022 by text page via the [facility] messaging system. Electronically Signed   By: Guadlupe Spanish M.D.   On: 05/08/2022 12:44   CT HEAD CODE STROKE WO CONTRAST  Result Date: 05/08/2022 CLINICAL DATA:  Code stroke.  Neuro deficit, acute, stroke suspected EXAM: CT HEAD WITHOUT  CONTRAST TECHNIQUE: Contiguous axial images were obtained from the base of the skull through the vertex without intravenous contrast. RADIATION DOSE REDUCTION: This exam was performed according to the departmental dose-optimization program which includes automated exposure control, adjustment of the mA and/or kV according to patient size and/or use of iterative reconstruction technique. COMPARISON:  10/06/2020 FINDINGS: Brain: No acute intracranial hemorrhage, mass effect, or edema. No acute appearing loss of gray-white differentiation. Chronic infarcts of the parasagittal right frontal lobe and left cerebellum. Additional patchy and confluent areas of low-density in the supratentorial white matter nonspecific but may reflect moderate to advanced chronic microvascular ischemic changes. Prominence of the ventricles and sulci reflects parenchymal volume loss. Vascular: No hyperdense vessel. There is intracranial atherosclerotic calcification at the skull base. Skull: Unremarkable. Sinuses/Orbits: No acute finding. Other: Mastoid air cells are clear. ASPECTS ([place] Stroke Program Early CT Score) - Ganglionic level infarction (caudate, lentiform nuclei, internal capsule, insula, M1-M3 cortex): 7 - Supraganglionic infarction (M4-M6 cortex): 3 Total score (0-10 with 10 being normal): 10 IMPRESSION: There is no acute intracranial hemorrhage or evidence of acute infarction. ASPECT score is 10. Chronic infarcts and chronic microvascular ischemic changes. These results were communicated to Dr. Otelia Limes at 12:19 pm on 05/08/2022 by text page via the [facility] messaging system. Electronically Signed   By: Guadlupe Spanish M.D.   On: 05/08/2022 12:23    EKG: Independently reviewed.  Atrial fibrillation with rate 100; nonspecific ST changes with no evidence of acute ischemia   Labs on Admission: I have personally reviewed the available labs and imaging studies at the time of the admission.  Pertinent labs:   None   Assessment  and Plan: Principal Problem:   Stroke-like symptoms vs. seizure like activity  Active Problems:   Paroxysmal atrial fibrillation (HCC)   Prolonged QT interval   Coronary artery disease involving coronary bypass graft of native heart without angina pectoris   Essential hypertension   Hyperlipidemia LDL goal <70   Acquired hypothyroidism   Chronic pain syndrome   Memory loss    Assessment and Plan: * Stroke-like symptoms vs. seizure like activity  82 year old presenting with witnessed episode of seizure like activity followed by facial droop and slurred speech that resolved while in ED. 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 Partial reconstitution intracranially at the clinoid. Noncalcified plaque causes up to marked stenosis of the supraclinoid portion. Per neurology, discussed with neuro IR -US carotid  -continue ASA, restart her eliquis  -echo -lipid panel and a1C  -EEG and seizure precautions  -N.p.o. until bedside swallow screen -PT/ OT/ SLP consult   Paroxysmal atrial fibrillation (HCC) Rate controlled  Continue toprol Restart eliquis   Prolonged QT interval Optimize electrolytes Keep on telemetry Avoid qt prolonging drugs  Repeat ekg in AM    Coronary artery disease involving coronary bypass graft of native heart without angina pectoris No chest pain, troponin wnl x 2 Continue medical management with ASA, eliquis, toprol, lipitor   Essential hypertension Continue home toprol-xl and hctz  Hyperlipidemia LDL goal <70 Fasting lipid panel in AM Goal LDL <70 Continue lipitor   Acquired hypothyroidism TSH wnl 10/2021 Continue home synthroid   Chronic pain syndrome Continue norco BID prn  pmp website verified and fills  correctly  Memory loss Recommend neurology f/u with neurocognitive testing     Advance Care Planning:   Code Status: DNR   Consults: neurology: Dr. Otelia Limes   DVT Prophylaxis: eliquis on hold for outpatient procedure,re start per neurology   Family Communication: daughter and son in law at bedside   Severity of Illness: The appropriate patient status for this patient is OBSERVATION. Observation status is judged to be reasonable and necessary in order to provide the required intensity of service to ensure the patient's safety. The patient's presenting symptoms, physical exam findings, and initial radiographic and laboratory data in the context of their medical condition is felt to place them at decreased risk for further clinical deterioration. Furthermore, it is anticipated that the patient will be medically stable for discharge from the hospital within 2 midnights of admission.   Author: Orland Mustard, MD 05/08/2022 8:25 PM  For on call review [email or web address].

## 2022-05-08 NOTE — Assessment & Plan Note (Signed)
Recommend neurology f/u with neurocognitive testing

## 2022-05-08 NOTE — Progress Notes (Signed)
EEG complete - results pending 

## 2022-05-08 NOTE — Assessment & Plan Note (Signed)
Continue home toprol-xl and hctz

## 2022-05-08 NOTE — Progress Notes (Signed)
SLP Cancellation Note  Patient Details Name: Anasia Agro MRN: [id number] DOB: 06-23-1941   Cancelled treatment:       Reason Eval/Treat Not Completed: Patient at procedure or test/unavailable (MRI). Will f/u as able.     Mahala Menghini., M.A. CCC-SLP Acute Rehabilitation Services Office [phone]  Secure chat preferred  05/08/2022, 3:43 PM

## 2022-05-08 NOTE — Consult Note (Addendum)
Neurology Consultation  Reason for Consult: Code Stroke Referring Physician: Dr. Doren Custard  CC: Facial droop, dysarthria  History is obtained from: Patient, EMS, daughter (over the phone), son in law (at bedside)  HPI: Carmen [name] is an 81 y.o. female with a past medical history of chronic pain, CVA, depression, PVD, hypothyroidism, afib on eliquis, anemia, CAD, and anxiety presenting as a code stroke from a skilled nursing facility. She slid out of her chair this morning at 0700 in front of her daughter. Her daughter tells [place] that she has a lift chair that she tends to slide out of. Patient was at her baseline when her daughter left after that initial incident. She then called her son-in-law and stated that she felt nauseous. Her daughter decided to pick her up and have her stay at the house for the week due to an upcoming procedure. Her daughter was helping her pack up when she became stiff and had odd body and facial movements, began breathing heavily and foaming at the mouth. Her daughter was able to catch her and help lower her to the floor. After this event she had a facial droop, right sided weakness, and slurred speech. When asked, the patient has no recollection of these events and does not remember seeing her daughter this morning. Family states that she has no history of seizures but she has had two strokes previously. She is on Eliquis for her atrial fibrillation.   Son in law is at the bedside after CT and states that the patient's voice sounds back to baseline and she seems "normal but tired". He also states that she has been disoriented and confused for a couple of weeks.    LKW: 1050 IV thrombolysis given?: no, on Eliquis  Premorbid modified Rankin scale (mRS): 4-Needs assistance to walk and tend to bodily needs   ROS: Full ROS was performed and is negative except as noted in the HPI.  Past Medical History:  Diagnosis Date   Abnormal CT scan, lumbar spine (05/11/2021) 05/17/2021    (05/11/2021) LUMBAR CT FINDINGS: Alignment: Lumbar levocurvature, apex L4. Mild lateral listhesis L4 on L5 of approximately 3 mm. Vertebrae: Remote appearing superior endplate deformities at T12 with 10% height loss and L1 with up to 20% height loss. Multilevel discogenic and facet degenerative changes. Mild bilateral SI joint arthrosis.  DISC LEVELS: T11-T12: Near complete disc height loss with de   Abnormal MRI, cervical spine (05/12/2021) 05/17/2021   (05/12/2021) CERVICAL MRI FINDINGS: Motion artifact is present. Posterior Fossa, vertebral arteries, paraspinal tissues: Left superior cerebellar infarct.   DISC LEVELS: C2-C3: Disc bulge with endplate osteophytes. Uncovertebral and facet hypertrophy. C3-C4: Disc bulge with endplate osteophytes. Uncovertebral and facet hypertrophy. Mild canal stenosis. Marked foraminal stenosis. 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 10/02/2020   Stroke [facility])    Thyroid disease      Family History  Problem Relation Age of Onset   Breast cancer Mother    Heart attack Father    Bipolar disorder Daughter    Diabetes Mellitus II Neg Hx      Social History:   reports that she quit smoking about 2 years  ago. Her smoking use included cigarettes. She has never used smokeless tobacco. She reports that she does not currently use alcohol. She reports that she does not use drugs.  Medications No current facility-administered medications for this encounter.  Current Outpatient Medications:    apixaban (ELIQUIS) 5 MG TABS tablet, Take 1 tablet (5 mg total) by mouth 2 (two) times daily., Disp: 180 tablet, Rfl: 3   aspirin EC 81 MG tablet, Take 81 mg by mouth daily. Swallow whole., Disp: , Rfl:    atorvastatin (LIPITOR) 40 MG  tablet, Take 1 tablet (40 mg total) by mouth daily., Disp: 90 tablet, Rfl: 1   calcium carbonate (OSCAL) 1500 (600 Ca) MG TABS tablet, Take 1 tablet (1,500 mg total) by mouth 2 (two) times daily with a meal., Disp: 60 tablet, Rfl: 2   cetirizine (ZYRTEC) 10 MG tablet, Take 10 mg by mouth daily., Disp: , Rfl:    CHOLECALCIFEROL PO, Take 1 capsule by mouth daily., Disp: , Rfl:    clonazePAM (KLONOPIN) 0.5 MG tablet, TAKE 1 TABLET BY MOUTH EVERY DAY AS NEEDED FOR ANXIETY, Disp: 30 tablet, Rfl: 0   hydrochlorothiazide (HYDRODIURIL) 25 MG tablet, Take 1 tablet (25 mg total) by mouth daily., Disp: 90 tablet, Rfl: 3   HYDROcodone-acetaminophen (NORCO/VICODIN) 5-325 MG tablet, Take 1 tablet by mouth 2 (two) times daily as needed for severe pain. Must last 30 days., Disp: 60 tablet, Rfl: 0   [START ON 05/30/2022] HYDROcodone-acetaminophen (NORCO/VICODIN) 5-325 MG tablet, Take 1 tablet by mouth 2 (two) times daily as needed for severe pain. Must last 30 days., Disp: 60 tablet, Rfl: 0   [START ON 06/29/2022] HYDROcodone-acetaminophen (NORCO/VICODIN) 5-325 MG tablet, Take 1 tablet by mouth 2 (two) times daily as needed for severe pain. Must last 30 days., Disp: 60 tablet, Rfl: 0   levothyroxine (SYNTHROID) 100 MCG tablet, Take 1 tablet (100 mcg total) by mouth daily before breakfast., Disp: 90 tablet, Rfl: 1   metoprolol succinate (TOPROL-XL) 100 MG 24 hr tablet, Take 0.5 tablets (50 mg total) by mouth daily., Disp: 45 tablet, Rfl: 3   traZODone (DESYREL) 100 MG tablet, Take 1 tablet (100 mg total) by mouth at bedtime as needed for sleep. for sleep, Disp: 90 tablet, Rfl: 1   Exam: Current vital signs: Wt 86.2 kg   BMI 35.91 kg/m  Vital signs in last 24 hours: Weight:  [86.2 kg] 86.2 kg (08/07 1200)  GENERAL: Awake, alert in NAD HEENT: - Normocephalic and atraumatic, dry mm, no LN++, no Thyromegally LUNGS - Clear to auscultation bilaterally with no wheezes CV - S1S2 RRR, no m/r/g, equal pulses  bilaterally. ABDOMEN - Soft, nontender, nondistended with normoactive BS Ext: warm, well perfused, intact peripheral pulses, no edema  NEURO:  Mental Status: Awake with mildly decreased level of alertness. Poor orientation (O x 1, giving the incorrect month and location). Unaware of current situation Language: speech is mildly dysarthric.  Naming, repetition, fluency, and comprehension intact, but with increased latencies of verbal responses. Cranial Nerves: PERRL EOMI, visual fields full, no facial asymmetry, facial sensation intact, hearing intact, tongue/uvula/soft palate midline, normal sternocleidomastoid and trapezius muscle strength. No evidence of tongue atrophy or fasciculations Motor: moves all extremities antigravity Tone: is normal and bulk is normal Sensation- Intact to light touch bilaterally Coordination: FTN intact bilaterally, no ataxia in BLE. Gait- deferred  NIHSS 1a Level of Conscious.: 0 1b LOC Questions: 1 1c LOC Commands: 0 2 Best Gaze: 0 3 Visual: 0 4 Facial Palsy: 0 5a  Motor Arm - left: 0 5b Motor Arm - Right: 0 6a Motor Leg - Left: 0 6b Motor Leg - Right: 0 7 Limb Ataxia: 0 8 Sensory: 0 9 Best Language: 0 10 Dysarthria: 1 11 Extinct. and Inatten.: 0 TOTAL: 2   Labs I have reviewed labs in epic and the results pertinent to this consultation are:  CBC    Component Value Date/Time   WBC 6.0 10/18/2021 1156   RBC 4.10 10/18/2021 1156   HGB 13.9 05/08/2022 1210   HGB 11.8 11/22/2020 0944   HCT 41.0 05/08/2022 1210   HCT 36.6 11/22/2020 0944   PLT 225.0 10/18/2021 1156   PLT 267 11/22/2020 0944   MCV 92.0 10/18/2021 1156   MCV 92 11/22/2020 0944   MCH 29.5 11/22/2020 0944   MCH 28.7 10/08/2020 0823   MCHC 32.3 10/18/2021 1156   RDW 14.4 10/18/2021 1156   RDW 13.2 11/22/2020 0944   LYMPHSABS 2.0 10/18/2021 1156   LYMPHSABS 1.5 11/22/2020 0944   MONOABS 0.7 10/18/2021 1156   EOSABS 0.1 10/18/2021 1156   EOSABS 0.2 11/22/2020 0944   BASOSABS  0.0 10/18/2021 1156   BASOSABS 0.0 11/22/2020 0944    CMP     Component Value Date/Time   NA 136 05/08/2022 1210   NA 139 01/11/2022 0822   K 3.6 05/08/2022 1210   CL 101 05/08/2022 1210   CO2 24 01/11/2022 0822   GLUCOSE 135 (H) 05/08/2022 1210   BUN 15 05/08/2022 1210   BUN 18 01/11/2022 0822   CREATININE 0.90 05/08/2022 1210   CALCIUM 9.6 01/11/2022 0822   PROT 6.8 10/18/2021 1156   PROT 6.8 11/22/2020 0944   ALBUMIN 4.2 10/18/2021 1156   ALBUMIN 4.3 11/22/2020 0944   AST 15 10/18/2021 1156   ALT 14 10/18/2021 1156   ALKPHOS 41 10/18/2021 1156   BILITOT 0.4 10/18/2021 1156   BILITOT 0.3 11/22/2020 0944   GFRNONAA >60 03/14/2021 1315   GFRAA 73 11/22/2020 0944    Lipid Panel     Component Value Date/Time   CHOL 182 07/26/2021 1432   TRIG 292.0 (H) 07/26/2021 1432   HDL 50.10 07/26/2021 1432   CHOLHDL 4 07/26/2021 1432   VLDL 58.4 (H) 07/26/2021 1432   LDLDIRECT 99.0 07/26/2021 1432     Imaging I have reviewed the images obtained:  CT-head- No acute intracranial hemorrhage   CTA Head and Neck- 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 Partial reconstitution intracranially at the clinoid. Noncalcified plaque causes up to marked stenosis of the supraclinoid portion. This is age-indeterminate though likely new from 2022 cervical spine MRI. Less than 50% stenosis at the right ICA origin. 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  MRI examination of the brain- pending   Assessment: 81 y.o. female with a past medical history of chronic pain, CVA, depression, PVD, hypothyroidism, afib on eliquis, anemia, CAD, and anxiety presenting as a code stroke from her assisted living facility. Her daughter was helping her pack up when she became stiff and had odd body and facial movements, began breathing heavy and foaming at the mouth. Her daughter was able to catch her and help lower her to the floor. After this  event she had a facial droop, right sided weakness, and slurred speech. Concern for stroke vs seizure. Plan to obtain MRI and an EEG as well as complete stroke workup.  - CT head: No ICH.  - CTA of head and neck: Occlusion  of the left cervical ICA just beyond its origin. Partial reconstitution intracranially at the clinoid. Noncalcified plaque causes up to marked stenosis of the supraclinoid portion. This is age-indeterminate though likely new from 2022 cervical spine MRI. Less than 50% stenosis at the right ICA origin. 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 - Of note, she stopped her Eliquis last night for a procedure she is supposed to have on Thursday.  - Stroke risk factors: Atrial fibrillation, prior CVA, PVD, anemia and CAD - Not a TNK candidate due to DOAC use within the past 48 hours  - Not a thrombectomy candidate due to mRS of 4, seizure as the most likely etiology for her presentation, uncertain age of the left ICA occlusion and intact collateral flow to the left MCA via an intact ACOM artery and left PCOM - Overall presentation most consistent with new onset seizure versus stroke or TIA.    Recommendations: - Discussed CTA head and neck results with neuro IR, Dr. Nuala Alpha - HgbA1c, fasting lipid panel - MRI of the brain without contrast - Frequent neuro checks - Echocardiogram - EEG  - Risk factor modification - Telemetry monitoring - PT consult, OT consult, Speech consult - Permissive HTN x 24 hours  Addendum: - MRI brain reveals no acute infarction. Remote left cerebellar and cortically based right frontal infarcts noted. Additional moderate patchy and confluent T2/FLAIR hyperintensity within the white matter, nonspecific but compatible with chronic microvascular disease. - Awaiting results of EEG.  - Restart Eliquis.   -- Patient seen and examined by NP/APP with MD.  Elmer Picker, DNP, FNP-BC [facility] Pager: [phone]  I have seen and examined the patient. I have discussed the assessment and recommendations with the Neurology NP and made amendations as needed. 81 y.o. female presenting as a code stroke after she became stiff and had odd body and facial movements, began breathing heavy and foaming at the mouth. Her daughter was able to catch her and help lower her to the floor. After this event she had a facial droop, right sided weakness, and slurred speech. Exam as documented above. Concern for stroke vs seizure. Plan to obtain MRI and an EEG as well as complete stroke workup.  Electronically signed: Dr. Caryl Pina

## 2022-05-08 NOTE — Assessment & Plan Note (Signed)
No chest pain, troponin wnl x 2 Continue medical management with ASA, eliquis, toprol, lipitor

## 2022-05-09 ENCOUNTER — Observation Stay (HOSPITAL_BASED_OUTPATIENT_CLINIC_OR_DEPARTMENT_OTHER): Payer: Medicare Other

## 2022-05-09 ENCOUNTER — Other Ambulatory Visit (HOSPITAL_COMMUNITY): Payer: Self-pay

## 2022-05-09 DIAGNOSIS — I480 Paroxysmal atrial fibrillation: Secondary | ICD-10-CM | POA: Diagnosis not present

## 2022-05-09 DIAGNOSIS — I639 Cerebral infarction, unspecified: Secondary | ICD-10-CM | POA: Diagnosis not present

## 2022-05-09 DIAGNOSIS — I10 Essential (primary) hypertension: Secondary | ICD-10-CM

## 2022-05-09 DIAGNOSIS — R2990 Unspecified symptoms and signs involving the nervous system: Secondary | ICD-10-CM | POA: Diagnosis not present

## 2022-05-09 DIAGNOSIS — I6522 Occlusion and stenosis of left carotid artery: Secondary | ICD-10-CM

## 2022-05-09 DIAGNOSIS — R569 Unspecified convulsions: Secondary | ICD-10-CM | POA: Diagnosis not present

## 2022-05-09 DIAGNOSIS — F05 Delirium due to known physiological condition: Secondary | ICD-10-CM | POA: Diagnosis present

## 2022-05-09 DIAGNOSIS — Z20822 Contact with and (suspected) exposure to covid-19: Secondary | ICD-10-CM | POA: Diagnosis not present

## 2022-05-09 DIAGNOSIS — E039 Hypothyroidism, unspecified: Secondary | ICD-10-CM | POA: Diagnosis not present

## 2022-05-09 DIAGNOSIS — R29818 Other symptoms and signs involving the nervous system: Secondary | ICD-10-CM | POA: Diagnosis not present

## 2022-05-09 LAB — ECHOCARDIOGRAM COMPLETE
Height: 61 in
S' Lateral: 2.8 cm
Weight: 3040.58 oz

## 2022-05-09 LAB — URINALYSIS, COMPLETE (UACMP) WITH MICROSCOPIC
Bacteria, UA: NONE SEEN
Bilirubin Urine: NEGATIVE
Glucose, UA: NEGATIVE mg/dL
Hgb urine dipstick: NEGATIVE
Ketones, ur: NEGATIVE mg/dL
Leukocytes,Ua: NEGATIVE
Nitrite: NEGATIVE
Protein, ur: 100 mg/dL — AB
Specific Gravity, Urine: 1.011 (ref 1.005–1.030)
pH: 7 (ref 5.0–8.0)

## 2022-05-09 LAB — LIPID PANEL
Cholesterol: 156 mg/dL (ref 0–200)
HDL: 48 mg/dL (ref >40)
LDL Cholesterol: 83 mg/dL (ref 0–99)
Total CHOL/HDL Ratio: 3.3 RATIO
Triglycerides: 125 mg/dL (ref <150)
VLDL: 25 mg/dL (ref 0–40)

## 2022-05-09 MED ORDER — DIVALPROEX SODIUM 250 MG PO DR TAB
750.0000 mg | DELAYED_RELEASE_TABLET | Freq: Once | ORAL | Status: AC
  Administered 2022-05-09: 750 mg via ORAL
  Filled 2022-05-09: qty 3

## 2022-05-09 MED ORDER — DIVALPROEX SODIUM 250 MG PO DR TAB: 500.0000 mg | DELAYED_RELEASE_TABLET | Freq: Three times a day (TID) | ORAL | Status: DC

## 2022-05-09 MED ORDER — DIVALPROEX SODIUM ER 500 MG PO TB24
1750.0000 mg | ORAL_TABLET | Freq: Once | ORAL | Status: DC
  Filled 2022-05-09: qty 1

## 2022-05-09 MED ORDER — HALOPERIDOL LACTATE 5 MG/ML IJ SOLN
2.0000 mg | Freq: Once | INTRAMUSCULAR | Status: AC
Start: 2022-05-09 — End: 2022-05-09
  Administered 2022-05-09: 2 mg via INTRAVENOUS
  Filled 2022-05-09: qty 1

## 2022-05-09 MED ORDER — DIVALPROEX SODIUM 500 MG PO DR TAB
500.0000 mg | DELAYED_RELEASE_TABLET | Freq: Three times a day (TID) | ORAL | 0 refills | Status: DC
Start: 2022-05-09 — End: 2022-05-18
  Filled 2022-05-09: qty 90, 30d supply, fill #0

## 2022-05-09 MED ORDER — MAGNESIUM SULFATE 2 GM/50ML IV SOLN
2.0000 g | Freq: Once | INTRAVENOUS | Status: AC
  Administered 2022-05-09: 2 g via INTRAVENOUS
  Filled 2022-05-09: qty 50

## 2022-05-09 MED ORDER — DIVALPROEX SODIUM 250 MG PO DR TAB
1000.0000 mg | DELAYED_RELEASE_TABLET | ORAL | Status: AC
  Administered 2022-05-09: 1000 mg via ORAL
  Filled 2022-05-09: qty 4

## 2022-05-09 MED ORDER — MAGNESIUM OXIDE 400 MG PO TABS
400.0000 mg | ORAL_TABLET | Freq: Every day | ORAL | 0 refills | Status: AC
  Filled 2022-05-09: qty 30, 30d supply, fill #0

## 2022-05-09 MED ORDER — VALPROATE SODIUM 100 MG/ML IV SOLN: 1724.0000 mg | Freq: Once | INTRAVENOUS | Status: DC

## 2022-05-09 NOTE — Progress Notes (Signed)
Patient agitated, combative and pulling off telemetry, gown and tugging at PIV.  Patients daughters at bedside attempting to keep patient in bed and calm.  Patient unable to reorient to place and situation.  Patient refusing to let staff assist her and currently paranoid.  MD notified.  MD evaluated at bedside and one-time dose of IV Haldol given per orders.

## 2022-05-09 NOTE — Progress Notes (Signed)
Carotid artery duplex completed. Refer to "CV Proc" under chart review to view preliminary results.  05/09/2022 8:53 AM [name]., MHA, RVT, RDCS, RDMS

## 2022-05-09 NOTE — Evaluation (Signed)
Occupational Therapy Evaluation Patient Details Name: Carmen [name] MRN: [id number] DOB: 27-Sep-1941 Today's Date: 05/09/2022   History of Present Illness Pt is an 81 y/o F presenting to ED on 8/7 from [facility] as code stroke with fall, R weakness, and slurred speech. PMH includes CAD, HTN, HLD, PVD, stroke, and CABG.   Clinical Impression   Pt from ILF, at baseline is independent with ADLs and some IADLs, receives assist from facility for meals, daughter visits pt daily and assists with transportation and med mgmt. Pt uses rollator at baseline for mobility. Pt currently needing supervision-minA for ADLs, mod I for bed mobility, and min guard for transfers with RW. Pt with RUE deficits in coordination, along with apparent cognitive deficits (per daughter is baseline from prior CVA). Pt with difficulty following instructions for visual assessment, however vision WFL for basic ADL/mobility tasks and reading. Pt presenting with impairments listed below, will follow acutely. Recommend HHOT at d/c.     Recommendations for follow up therapy are one component of a multi-disciplinary discharge planning process, led by the attending physician.  Recommendations may be updated based on patient status, additional functional criteria and insurance authorization.   Follow Up Recommendations  Home health OT    Assistance Recommended at Discharge Intermittent Supervision/Assistance  Patient can return home with the following A little help with walking and/or transfers;A little help with bathing/dressing/bathroom;Assistance with cooking/housework;Direct supervision/assist for medications management;Assist for transportation;Direct supervision/assist for financial management;Help with stairs or ramp for entrance    Functional Status Assessment  Patient has had a recent decline in their functional status and demonstrates the ability to make significant improvements in function in a reasonable and predictable  amount of time.  Equipment Recommendations  Other (comment);None recommended by OT (pt has all needed DME)    Recommendations for Other Services PT consult     Precautions / Restrictions Precautions Precautions: Fall Precaution Comments: reports muliple falls Restrictions Weight Bearing Restrictions: No      Mobility Bed Mobility Overal bed mobility: Modified Independent                  Transfers Overall transfer level: Needs assistance Equipment used: Rolling walker (2 wheels) Transfers: Sit to/from Stand Sit to Stand: Min guard           General transfer comment: cues for safety      Balance Overall balance assessment: Needs assistance Sitting-balance support: Feet supported, Bilateral upper extremity supported Sitting balance-[name]: Fair Sitting balance - Comments: post lean with dynamic tasks Postural control: Posterior lean Standing balance support: During functional activity, Reliant on assistive device for balance Standing balance-[name]: Poor Standing balance comment: reliant on external support                           ADL either performed or assessed with clinical judgement   ADL Overall ADL's : Needs assistance/impaired Eating/Feeding: Supervision/ safety   Grooming: Supervision/safety   Upper Body Bathing: Supervision/ safety   Lower Body Bathing: Minimal assistance   Upper Body Dressing : Supervision/safety   Lower Body Dressing: Minimal assistance   Toilet Transfer: Min guard;Rolling walker (2 wheels);Ambulation;Regular Toilet   Toileting- Architect and Hygiene: Supervision/safety       Functional mobility during ADLs: Min guard;Rolling walker (2 wheels)       Vision   Vision Assessment?: Vision impaired- to be further tested in functional context;Yes Eye Alignment: Within Functional Limits Tracking/Visual Pursuits: Decreased smoothness  of vertical tracking;Decreased smoothness of  horizontal tracking;Unable to hold eye position out of midline Additional Comments: unable to perform formal assessment, pt unable to follow instructions, functionally [facility] for tasks assessed, can read short paragraph     Perception     Praxis      Pertinent Vitals/Pain Pain Assessment Pain Assessment: No/denies pain     Hand Dominance Right   Extremity/Trunk Assessment Upper Extremity Assessment Upper Extremity Assessment: RUE deficits/detail RUE Deficits / Details: mild weakness compared to LUE, decreased coordination RUE Sensation: WNL RUE Coordination: decreased fine motor   Lower Extremity Assessment Lower Extremity Assessment: Defer to PT evaluation   Cervical / Trunk Assessment Cervical / Trunk Assessment: Normal   Communication Communication Communication: No difficulties   Cognition Arousal/Alertness: Awake/alert Behavior During Therapy: WFL for tasks assessed/performed, Impulsive Overall Cognitive Status: History of cognitive impairments - at baseline                                 General Comments: family reports attention/problem solving/memory deficits at baseline from prior CVA     General Comments  VSS on RA, family present in room    Exercises     Shoulder Instructions      Home Living Family/patient expects to be discharged to::  [facility] IL[facility]) Living Arrangements: Alone                               Additional Comments: ind with ADLs, facility assists with meals, daughter assists with transportation and med mgmt, comes over daily      Prior Functioning/Environment Prior Level of Function : Independent/Modified Independent             Mobility Comments: rollator for mobility when walking to dining hall for meals, not in home ADLs Comments: does ADLs, facility assists with IADLs, daughter provides transportation        OT Problem List: Decreased strength;Decreased range of motion;Decreased activity  tolerance;Impaired balance (sitting and/or standing);Decreased cognition;Decreased safety awareness;Decreased knowledge of use of DME or AE;Impaired UE functional use      OT Treatment/Interventions: Therapeutic exercise;Self-care/ADL training;Energy conservation;Cognitive remediation/compensation;Visual/perceptual remediation/compensation;Patient/family education;Balance training;Therapeutic activities;DME and/or AE instruction    OT Goals(Current goals can be found in the care plan section) Acute Rehab OT Goals Patient Stated Goal: none stated OT Goal Formulation: With patient Time For Goal Achievement: 05/23/22 Potential to Achieve Goals: Good  OT Frequency: Min 2X/week    Co-evaluation              AM-PAC OT "6 Clicks" Daily Activity     Outcome Measure Help from another person eating meals?: None Help from another person taking care of personal grooming?: A Little Help from another person toileting, which includes using toliet, bedpan, or urinal?: A Little Help from another person bathing (including washing, rinsing, drying)?: A Little Help from another person to put on and taking off regular upper body clothing?: A Little Help from another person to put on and taking off regular lower body clothing?: A Little 6 Click Score: 19   End of Session Equipment Utilized During Treatment: Gait belt;Rolling walker (2 wheels) Nurse Communication: Mobility status  Activity Tolerance: Patient tolerated treatment well Patient left: in bed;with call bell/phone within reach;with bed alarm set;with family/visitor present  OT Visit Diagnosis: Unsteadiness on feet (R26.81);Other abnormalities of gait and mobility (R26.89);Muscle weakness (generalized) (M62.81);Other symptoms  and signs involving cognitive function                Time: 9735-3299 OT Time Calculation (min): 33 min Charges:  OT General Charges $OT Visit: 1 Visit OT Evaluation $OT Eval Moderate Complexity: 1 Mod OT  Treatments $Self Care/Home Management : 8-22 mins  Alfonzo Beers, OTD, OTR/L Acute Rehab [phone]) [phone]  Mayer Masker 05/09/2022, 10:43 AM

## 2022-05-09 NOTE — Care Management Obs Status (Signed)
MEDICARE OBSERVATION STATUS NOTIFICATION   Patient Details  Name: Carmen [name] MRN: [id number] Date of Birth: 08/27/1941   Medicare Observation Status Notification Given:  Yes    Kermit Balo, RN 05/09/2022, 3:04 PM

## 2022-05-09 NOTE — Progress Notes (Signed)
AVS reviewed with patient's daughter at bedside including new prescriptions and follow-up appointments.  All questions/ concerns answered.  RN and patient's daughters assisted patient with dressing.  Patient taken to private vehicle via wheelchair with staff present.

## 2022-05-09 NOTE — Evaluation (Signed)
Physical Therapy Evaluation Patient Details Name: Carmen [name] MRN: [id number] DOB: 09/29/1941 Today's Date: 05/09/2022  History of Present Illness  Pt is an 81 y/o F presenting to ED on 8/7 from [facility] as code stroke with fall, R weakness, and slurred speech. PMH includes CAD, HTN, HLD, PVD, stroke, and CABG.  Clinical Impression  Pt admitted with/for s/s of stroke or seizure.  Pt not quite at baseline function, needing min guard generally with episodes of min assist for balance deviation.  Pt currently limited functionally due to the problems listed below.  (see problems list.)  Pt will benefit from PT to maximize function and safety to be able to get home safely with available assist .        Recommendations for follow up therapy are one component of a multi-disciplinary discharge planning process, led by the attending physician.  Recommendations may be updated based on patient status, additional functional criteria and insurance authorization.  Follow Up Recommendations Home health PT      Assistance Recommended at Discharge Set up Supervision/Assistance  Patient can return home with the following  A little help with bathing/dressing/bathroom;Assistance with cooking/housework;Assist for transportation    Equipment Recommendations None recommended by PT  Recommendations for Other Services       Functional Status Assessment Patient has had a recent decline in their functional status and demonstrates the ability to make significant improvements in function in a reasonable and predictable amount of time.     Precautions / Restrictions Precautions Precautions: Fall Precaution Comments: reports muliple falls Restrictions Weight Bearing Restrictions: No      Mobility  Bed Mobility Overal bed mobility: Modified Independent                  Transfers Overall transfer level: Needs assistance Equipment used: Rolling walker (2 wheels) Transfers: Sit to/from  Stand Sit to Stand: Min guard           General transfer comment: cues for safety    Ambulation/Gait Ambulation/Gait assistance: Min guard, Min assist Gait Distance (Feet): 180 Feet Assistive device: None Gait Pattern/deviations: Step-through pattern   Gait velocity interpretation: <1.8 ft/sec, indicate of risk for recurrent falls   General Gait Details: short, guarded, mildly unsteady steps, challenge invariably causing deviation that pt usually recovers from or needed light minimal assist.  challenges including abrupt turns, walking backward and scanning in a large arc.  Stairs            Wheelchair Mobility    Modified Rankin (Stroke Patients Only)       Balance Overall balance assessment: Needs assistance Sitting-balance support: Feet supported, Bilateral upper extremity supported Sitting balance-[name]: Fair Sitting balance - Comments: post lean with dynamic tasks Postural control: Posterior lean Standing balance support: During functional activity, Reliant on assistive device for balance Standing balance-[name]: Poor (to fair) Standing balance comment: reliant on external support                             Pertinent Vitals/Pain Pain Assessment Pain Assessment: Faces Faces Pain Scale: Hurts little more Pain Location: back Pain Descriptors / Indicators: Discomfort Pain Intervention(s): Monitored during session    Home Living Family/patient expects to be discharged to:: Private residence [facility]) Living Arrangements: Alone   Type of Home: Independent living facility Home Access: Level entry       Home Layout: One level Home Equipment: Rollator (4 wheels);Toilet riser;Shower seat;BSC/3in1  Additional Comments: ind with ADLs, facility assists with meals, daughter assists with transportation and med mgmt, comes over daily    Prior Function Prior Level of Function : Independent/Modified Independent              Mobility Comments: rollator for mobility when walking to dining hall for meals, not in home ADLs Comments: does ADLs, facility assists with IADLs, daughter provides transportation     Hand Dominance   Dominant Hand: Right    Extremity/Trunk Assessment   Upper Extremity Assessment Upper Extremity Assessment: Defer to OT evaluation RUE Deficits / Details: mild weakness compared to LUE, decreased coordination RUE Sensation: WNL RUE Coordination: decreased fine motor    Lower Extremity Assessment Lower Extremity Assessment: Overall WFL for tasks assessed (mild large muscle group weakness bil)    Cervical / Trunk Assessment Cervical / Trunk Assessment: Normal  Communication   Communication: No difficulties  Cognition Arousal/Alertness: Awake/alert Behavior During Therapy: WFL for tasks assessed/performed, Impulsive Overall Cognitive Status: History of cognitive impairments - at baseline                                 General Comments: family reports attention/problem solving/memory deficits at baseline from prior CVA        General Comments General comments (skin integrity, edema, etc.): vss on RA present in the room through out    Exercises     Assessment/Plan    PT Assessment Patient needs continued PT services  PT Problem List Decreased strength;Decreased activity tolerance;Decreased balance;Decreased mobility       PT Treatment Interventions DME instruction;Gait training;Functional mobility training;Therapeutic activities;Balance training;Patient/family education    PT Goals (Current goals can be found in the Care Plan section)  Acute Rehab PT Goals Patient Stated Goal: home by myself PT Goal Formulation: With patient Time For Goal Achievement: 05/12/22 Potential to Achieve Goals: Good    Frequency Min 3X/week     Co-evaluation               AM-PAC PT "6 Clicks" Mobility  Outcome Measure Help needed turning from your back to your  side while in a flat bed without using bedrails?: A Little Help needed moving from lying on your back to sitting on the side of a flat bed without using bedrails?: A Little Help needed moving to and from a bed to a chair (including a wheelchair)?: A Little Help needed standing up from a chair using your arms (e.g., wheelchair or bedside chair)?: A Little Help needed to walk in hospital room?: A Little Help needed climbing 3-5 steps with a railing? : A Little 6 Click Score: 18    End of Session   Activity Tolerance: Patient tolerated treatment well Patient left: in bed;with call bell/phone within reach;with family/visitor present Nurse Communication: Mobility status;Other (comment) PT Visit Diagnosis: Unsteadiness on feet (R26.81);Other abnormalities of gait and mobility (R26.89);Muscle weakness (generalized) (M62.81)    Time: 9604-5409 PT Time Calculation (min) (ACUTE ONLY): 34 min   Charges:   PT Evaluation $PT Eval Low Complexity: 1 Low PT Treatments $Gait Training: 8-22 mins        05/09/2022  Jacinto Halim., PT Acute Rehabilitation Services [phone]  (pager) [phone]  (office)  Eliseo Gum Kenedie Dirocco 05/09/2022, 11:14 AM

## 2022-05-09 NOTE — Progress Notes (Addendum)
Brief review of HPI: 81 y.o. female with a past medical history of chronic pain, CVA, depression, PVD, hypothyroidism, afib on eliquis, anemia, CAD, and anxiety who presented as a code stroke from her assisted living facility. Her daughter was helping her pack up when she became stiff and had odd body and facial movements, began breathing heavy and foaming at the mouth. Her daughter was able to catch her and help lower her to the floor. After this event she had a facial droop, right sided weakness, and slurred speech.   Subjective: Patient seen sitting up in the chair. She is alert, pleasant and appropriate in conversation. She does have some confusion about her current location and situation. When she is told that we are concerned that she may have had a seizure she states that she does have seizures frequently. She states that these episodes cause her to "stiffen up and shake". They last about a minute and happen multiple times per month.  Daughter was called to confirm, but voicemail box is not set up. Will continue to try to reach family to gather more information.   Exam: Vitals:   05/08/22 1957 05/09/22 0036  BP: (!) 159/90 (!) 147/110  Pulse: 82 79  Resp: 14   Temp: 98.6 F (37 C) 98.3 F (36.8 C)  SpO2: 98% 100%   Gen: In bed, NAD Resp: non-labored breathing, no acute distress Abd: soft, nt  NEURO:  Mental Status: Awake and alert. Oriented to self, and date. States she is in [place] and that she is in the hospital because of a fall.  Language: speech is clear.  Naming, repetition, fluency, and comprehension intact. Cranial Nerves: PERRL EOMI, visual fields full, no facial asymmetry, facial sensation intact, hearing intact, tongue/uvula/soft palate midline, normal sternocleidomastoid and trapezius muscle strength. No evidence of tongue atrophy or fasciculations Motor: moves all extremities antigravity, strength is 5/5 with no drift Tone: is normal and bulk is normal Sensation-  Intact to light touch bilaterally Coordination: FTN intact bilaterally, no ataxia in BLE. Gait- deferred  Pertinent Labs: Ha1C- 6.3 LDL- 83   Imaging reviewed: MRI brain- no acute infarction. Remote left cerebellar and cortically based right frontal infarcts noted. Additional moderate patchy and confluent T2/FLAIR hyperintensity within the white matter, nonspecific but compatible with chronic microvascular disease. EEG- This study is within normal limits. No seizures or epileptiform discharges were seen throughout the recording. 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 female with a past medical history of chronic pain, CVA, depression, PVD, hypothyroidism, afib on eliquis, anemia, CAD, and anxiety presenting as a code stroke from her assisted living facility. Her daughter was helping her pack up when she became stiff and had odd body and facial movements, began breathing heavy and foaming at the mouth. Her daughter was able to catch her and help lower her to the floor. After this event she had a facial droop, right sided weakness, and slurred speech. Concern for seizure.  - MRI is negative for acute intracranial abnormality.  - EEG: This study is within normal limits. No seizures or epileptiform discharges were seen throughout the recording.  Recommendations: - Follow up with daughter for further information regarding patient reporting multiple seizure-like episodes in the past - Consider starting AEDs - Complete her stroke workup as she has had strokes in the past  Patient seen and examined by NP/APP with MD.  Elmer Picker, DNP, FNP-BC [facility] Pager: [phone]  Electronically signed: Dr. Minerva Areola  Otelia Limes

## 2022-05-09 NOTE — Procedures (Signed)
Patient Name: Carmen [name]  MRN: [id number]  Epilepsy Attending: Charlsie Quest  Referring Physician/Provider: Elmer Picker, NP  Date: 05/08/2022  Duration: 22.27 mins  Patient history: 81 y.o. female presenting as a code stroke from her assisted living facility. Her daughter was helping her pack up when she became stiff and had odd body and facial movements, began breathing heavy and foaming at the mouth. Her daughter was able to catch her and help lower her to the floor. After this event she had a facial droop, right sided weakness, and slurred speech. EEG to evaluate for seizure  Level of alertness: Awake  AEDs during EEG study: None  Technical aspects: This EEG study was done with scalp electrodes positioned according to the 10-20 International system of electrode placement. Electrical activity was reviewed with band pass filter of 1-70Hz , sensitivity of 7 uV/mm, display speed of 74mm/sec with a 60Hz  notched filter applied as appropriate. EEG data were recorded continuously and digitally stored.  Video monitoring was available and reviewed as appropriate.  Description: The posterior dominant rhythm consists of 8 Hz activity of moderate voltage (25-35 uV) seen predominantly in posterior head regions, symmetric and reactive to eye opening and eye closing. Hyperventilation and photic stimulation were not performed.     IMPRESSION: This study is within normal limits. No seizures or epileptiform discharges were seen throughout the recording.  A normal interictal EEG does not exclude nor support the diagnosis of epilepsy.   Carmen [name] 

## 2022-05-09 NOTE — Progress Notes (Signed)
Patient remains agitated, combative and paranoid after Haldol administration.  Patient attempting to get out of bed and pulled out IV and pulled telemetry off again.  Patient refusing to allow RN to replace IV and telemetry monitor.  MD notified.  Per MD, okay to leave PIV out and cardiac monitoring discontinued. Patient assisted back into bed by RN and patients family at bedside. Consulting civil engineer notified.  No sitters available.  Per Consulting civil engineer,  encouraged family to spend the night at bedside if patient is not discharged today.  Patients daughter agreeable.

## 2022-05-09 NOTE — Progress Notes (Signed)
SLP Cancellation Note  Patient Details Name: Carmen [name] MRN: [id number] DOB: Sep 03, 1941   Cancelled treatment:       Reason Eval/Treat Not Completed: Other (comment) Pt has passed the [facility] since SLP was initially ordered and is on a diet. No concerns per nursing. Will defer SLP swallow eval and f/u for cognitive-linguistic eval as able.    Mahala Menghini., M.A. CCC-SLP Acute Rehabilitation Services Office [phone]  Secure chat preferred  05/09/2022, 3:31 PM

## 2022-05-09 NOTE — TOC Initial Note (Signed)
Transition of Care [facility]) - Initial/Assessment Note    Patient Details  Name: Carmen [name] MRN: [id number] Date of Birth: 05/26/1941  Transition of [facility]) CM/SW Contact:    Kermit Balo, RN Phone Number: 05/09/2022, 2:58 PM  Clinical Narrative:                 The patient is from a senior apartment in [place]: [facility]. Her daughter manages her medications and provides needed transportation.  Recommendations for home health services. Daughter has not preference. CM has arranged home health with [facility] Information on the AVS.  Pt will have transport home when discharged.   Expected Discharge Plan: Home w Home Health Services Barriers to Discharge: Continued Medical Work up   Patient Goals and CMS Choice   CMS Medicare.gov Compare Post Acute Care list provided to:: Patient Represenative (must comment) Choice offered to / list presented to : Adult Children  Expected Discharge Plan and Services Expected Discharge Plan: Home w Home Health Services   Discharge Planning Services: CM Consult Post Acute Care Choice: Home Health Living arrangements for the past 2 months: Apartment                           HH Arranged: PT, OT HH Agency: [facility] Date [facility] Agency Contacted: 05/09/22   Representative spoke with at [facility] Agency: Kandee Keen  Prior Living Arrangements/Services Living arrangements for the past 2 months: Apartment Lives with:: Self Patient language and need for interpreter reviewed:: Yes Do you feel safe going back to the place where you live?: Yes          Current home services: DME (rollator/ toilet with handles) Criminal Activity/Legal Involvement Pertinent to Current Situation/Hospitalization: No - Comment as needed  Activities of Daily Living Home Assistive Devices/Equipment: Environmental consultant (specify type) ADL Screening (condition at time of admission) Patient's cognitive ability adequate to safely complete daily activities?: Yes Is the  patient deaf or have difficulty hearing?: No Does the patient have difficulty seeing, even when wearing glasses/contacts?: No Does the patient have difficulty concentrating, remembering, or making decisions?: No Patient able to express need for assistance with ADLs?: Yes Does the patient have difficulty dressing or bathing?: Yes Independently performs ADLs?: Yes (appropriate for developmental age) Does the patient have difficulty walking or climbing stairs?: Yes Weakness of Legs: Both Weakness of Arms/Hands: None  Permission Sought/Granted                  Emotional Assessment Appearance:: Appears stated age Attitude/Demeanor/Rapport: Other (comment) (quiet)   Orientation: : Oriented to Self, Oriented to Place   Psych Involvement: No (comment)  Admission diagnosis:  Stroke-like symptoms [R29.90] Patient Active Problem List   Diagnosis Date Noted   Stroke-like symptoms vs. 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   Lumbosacral foraminal stenosis (Multilevel) (Bilateral) 01/26/2022   Degenerative lateral spondylolisthesis of L4/L5 (82mm) 01/26/2022   Coronary artery disease involving coronary bypass graft of native heart without angina pectoris 12/08/2021   Anxiety 10/18/2021   Leg edema 10/18/2021   DDD (degenerative disc disease), lumbosacral 05/31/2021   Lumbar facet syndrome (Bilateral) 05/17/2021   Lumbosacral facet hypertrophy (Multilevel) (Bilateral) 05/17/2021   Lumbar facet arthropathy (Multilevel) (Bilateral) 05/17/2021   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/17/2021   Cervical facet syndrome (Bilateral) 05/17/2021   Cervical  facet hypertrophy (Multilevel) (Bilateral) 05/17/2021   Spondylosis without myelopathy or radiculopathy, cervical region 05/17/2021   Spondylosis without myelopathy or radiculopathy, lumbosacral region  05/17/2021   Other intervertebral disc degeneration, lumbar region 05/17/2021   Chronic cervical radiculopathy (Bilateral) 05/02/2021   DDD (degenerative disc disease), cervical 05/02/2021   Foraminal stenosis of cervical region 05/02/2021   Vitamin D deficiency 04/30/2021   Osteopenia determined by x-ray 04/30/2021   Lumbar compression fracture (Old) (Multilevel), sequela 04/30/2021   Chronic pain syndrome 03/14/2021   Disorder of skeletal system 03/14/2021   Cervicalgia 03/14/2021   Chronic neck pain (2ry area of Pain) (Bilateral) (L>R) 03/14/2021   Chronic lower extremity pain (3ry area of Pain) (Bilateral) (L>R) 03/14/2021   Pain in both lower extremities 03/14/2021   Chronic hand pain (Left) 03/14/2021   Chronic hand pain (Right) 03/14/2021   Chronic low back pain (1ry area of Pain) (Bilateral) (R>L) w/o sciatica 02/17/2021   Difficulty sleeping 02/17/2021   At risk for falls 02/03/2021   Essential hypertension 11/26/2020   Paroxysmal atrial fibrillation (HCC) 11/18/2020   Repeated falls 11/18/2020   CVA (cerebral vascular accident) (HCC) 10/06/2020   Peripheral vascular disease (HCC) 10/06/2020   Depression, unspecified 10/02/2020   Acquired hypothyroidism 10/02/2020   Athscl heart disease of native coronary artery w/o ang pctrs 10/02/2020   Vitamin B deficiency, unspecified 10/02/2020   Other chronic pain 10/02/2020   Hyperlipidemia LDL goal <70 10/02/2020   Anemia, unspecified 10/02/2020   Dorsalgia, unspecified 10/02/2020   History of stroke 10/02/2020   Chronic anticoagulation (Eliquis) 10/02/2020   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   Presence of coronary angioplasty implant and graft 10/02/2020   PCP:  Mort Sawyers, FNP Pharmacy:   [facility] [address], [place] - [address] [place] [address] Phone: [phone] Fax: [phone]  [facility] [phone] - [place], [place] - [address] [place] [address] Phone: [phone] Fax: [phone]     Social Determinants of Health (SDOH) Interventions    Readmission Risk Interventions     No data to display

## 2022-05-09 NOTE — Discharge Summary (Signed)
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 SARS-CoV-2 by FDA under an Emergency Use Authorization (EUA). This EUA will remain  in effect (meaning this test can be used) for the duration of the COVID-19 declaration under Section 564(b)(1) of the Act, 21 U.S.C.section 360bbb-3(b)(1), unless the authorization is terminated  or  revoked sooner.       Influenza A by PCR NEGATIVE NEGATIVE Final   Influenza B by PCR NEGATIVE NEGATIVE Final    Comment: (NOTE) The Xpert Xpress SARS-CoV-2/FLU/RSV plus assay is intended as an aid in the diagnosis of influenza from Nasopharyngeal swab specimens and should not be used as a sole basis for treatment. Nasal washings and aspirates are unacceptable for Xpert Xpress SARS-CoV-2/FLU/RSV testing.  Fact Sheet for Patients: BloggerCourse.com  Fact Sheet for Healthcare Providers: SeriousBroker.it  This test is not yet approved or cleared by the [place] FDA and has been authorized for detection and/or diagnosis of  SARS-CoV-2 by FDA under an Emergency Use Authorization (EUA). This EUA will remain in effect (meaning this test can be used) for the duration of the COVID-19 declaration under Section 564(b)(1) of the Act, 21 U.S.C. section 360bbb-3(b)(1), unless the authorization is terminated or revoked.  Performed at [facility], [address] [place], [place] [address]      Labs: BNP (last 3 results) No results for input(s): "BNP" in the last 8760 hours. Basic Metabolic Panel: Recent Labs  Lab 05/08/22 1210 05/08/22 2022  NA 137  136  --   K 3.6  3.6  --   CL 102  101  --   CO2 22  --   GLUCOSE 138*  135*  --   BUN 13  15  --   CREATININE 0.97  0.90  --   CALCIUM 9.6  --   MG  --  1.4*   Liver Function Tests: Recent Labs  Lab 05/08/22 1210  AST 26  ALT 20  ALKPHOS 42  BILITOT 0.7  PROT 7.0  ALBUMIN 4.3   No results for input(s): "LIPASE", "AMYLASE" in the last 168 hours. No results for input(s): "AMMONIA" in the last 168 hours. CBC: Recent Labs  Lab 05/08/22 1210  WBC 10.3  NEUTROABS 7.7  HGB 13.1  13.9  HCT 39.2  41.0  MCV 92.7  PLT 216   Cardiac Enzymes: No results for input(s): "CKTOTAL", "CKMB", "CKMBINDEX", "TROPONINI" in the last 168 hours. BNP: Invalid input(s): "POCBNP" CBG: Recent Labs  Lab 05/08/22 1205  GLUCAP 132*   D-Dimer No results for input(s): "DDIMER" in the last 72 hours. Hgb A1c Recent Labs    05/08/22 1835  HGBA1C 6.3*   Lipid Profile Recent Labs    05/09/22 0559  CHOL 156  HDL 48  LDLCALC 83  TRIG 125  CHOLHDL 3.3   Thyroid function studies No results for input(s): "TSH", "T4TOTAL", "T3FREE", "THYROIDAB" in the last 72 hours.  Invalid input(s): "FREET3" Anemia work up No results for input(s): "VITAMINB12", "FOLATE", "FERRITIN", "TIBC", "IRON", "RETICCTPCT" in the last 72 hours. Urinalysis    Component Value Date/Time   COLORURINE STRAW (A) 05/09/2022 0029   APPEARANCEUR CLEAR 05/09/2022 0029    LABSPEC 1.011 05/09/2022 0029   PHURINE 7.0 05/09/2022 0029   GLUCOSEU NEGATIVE 05/09/2022 0029   HGBUR NEGATIVE 05/09/2022 0029   BILIRUBINUR NEGATIVE 05/09/2022 0029   KETONESUR NEGATIVE 05/09/2022 0029   PROTEINUR 100 (A) 05/09/2022 0029   NITRITE NEGATIVE 05/09/2022 0029   LEUKOCYTESUR NEGATIVE 05/09/2022 0029   Sepsis Labs Recent Labs  Lab 05/08/22 1210  WBC 10.3   Microbiology Recent Results (from the past 240 hour(s))  Resp Panel by RT-PCR (Flu A&B, Covid) Anterior Nasal Swab     Status: None   Collection Time: 05/08/22 12:05 PM   Specimen: Anterior Nasal Swab  Result Value Ref  Range Status   SARS Coronavirus 2 by RT PCR NEGATIVE NEGATIVE Final    Comment: (NOTE) SARS-CoV-2 target nucleic acids are NOT DETECTED.  The SARS-CoV-2 RNA is generally detectable in upper respiratory specimens during the acute phase of infection. The lowest concentration of SARS-CoV-2 viral copies this assay can detect is 138 copies/mL. A negative result does not preclude SARS-Cov-2 infection and should not be used as the sole basis for treatment or other patient management decisions. A negative result may occur with  improper specimen collection/handling, submission of specimen other than nasopharyngeal swab, presence of viral mutation(s) within the areas targeted by this assay, and inadequate number of viral copies(<138 copies/mL). A negative result must be combined with clinical observations, patient history, and epidemiological information. The expected result is Negative.  Fact Sheet for Patients:  BloggerCourse.com  Fact Sheet for Healthcare Providers:  SeriousBroker.it  This test is no t yet approved or cleared by the [place] FDA and  has been authorized for detection and/or diagnosis of SARS-CoV-2 by FDA under an Emergency Use Authorization (EUA). This EUA will remain  in effect (meaning this test can be used) for the  duration of the COVID-19 declaration under Section 564(b)(1) of the Act, 21 U.S.C.section 360bbb-3(b)(1), unless the authorization is terminated  or revoked sooner.       Influenza A by PCR NEGATIVE NEGATIVE Final   Influenza B by PCR NEGATIVE NEGATIVE Final    Comment: (NOTE) The Xpert Xpress SARS-CoV-2/FLU/RSV plus assay is intended as an aid in the diagnosis of influenza from Nasopharyngeal swab specimens and should not be used as a sole basis for treatment. Nasal washings and aspirates are unacceptable for Xpert Xpress SARS-CoV-2/FLU/RSV testing.  Fact Sheet for Patients: BloggerCourse.com  Fact Sheet for Healthcare Providers: SeriousBroker.it  This test is not yet approved or cleared by the [place] FDA and has been authorized for detection and/or diagnosis of SARS-CoV-2 by FDA under an Emergency Use Authorization (EUA). This EUA will remain in effect (meaning this test can be used) for the duration of the COVID-19 declaration under Section 564(b)(1) of the Act, 21 U.S.C. section 360bbb-3(b)(1), unless the authorization is terminated or revoked.  Performed at [facility], [address] [place], [place] [address]      Time coordinating discharge: 35 minutes  SIGNED:   Dorcas Carrow, MD  [facility] 05/09/2022, 3:41 PM

## 2022-05-11 ENCOUNTER — Ambulatory Visit: Payer: Medicare Other | Admitting: Pain Medicine

## 2022-05-12 ENCOUNTER — Telehealth: Payer: Self-pay | Admitting: Family

## 2022-05-12 DIAGNOSIS — Z7901 Long term (current) use of anticoagulants: Secondary | ICD-10-CM | POA: Diagnosis not present

## 2022-05-12 DIAGNOSIS — G894 Chronic pain syndrome: Secondary | ICD-10-CM | POA: Diagnosis not present

## 2022-05-12 DIAGNOSIS — F419 Anxiety disorder, unspecified: Secondary | ICD-10-CM | POA: Diagnosis not present

## 2022-05-12 DIAGNOSIS — Z9181 History of falling: Secondary | ICD-10-CM | POA: Diagnosis not present

## 2022-05-12 DIAGNOSIS — I480 Paroxysmal atrial fibrillation: Secondary | ICD-10-CM | POA: Diagnosis not present

## 2022-05-12 DIAGNOSIS — I739 Peripheral vascular disease, unspecified: Secondary | ICD-10-CM | POA: Diagnosis not present

## 2022-05-12 DIAGNOSIS — D649 Anemia, unspecified: Secondary | ICD-10-CM | POA: Diagnosis not present

## 2022-05-12 DIAGNOSIS — I2510 Atherosclerotic heart disease of native coronary artery without angina pectoris: Secondary | ICD-10-CM | POA: Diagnosis not present

## 2022-05-12 DIAGNOSIS — Z951 Presence of aortocoronary bypass graft: Secondary | ICD-10-CM | POA: Diagnosis not present

## 2022-05-12 DIAGNOSIS — Z7983 Long term (current) use of bisphosphonates: Secondary | ICD-10-CM | POA: Diagnosis not present

## 2022-05-12 DIAGNOSIS — Z7982 Long term (current) use of aspirin: Secondary | ICD-10-CM | POA: Diagnosis not present

## 2022-05-12 DIAGNOSIS — M549 Dorsalgia, unspecified: Secondary | ICD-10-CM | POA: Diagnosis not present

## 2022-05-12 DIAGNOSIS — Z87891 Personal history of nicotine dependence: Secondary | ICD-10-CM | POA: Diagnosis not present

## 2022-05-12 DIAGNOSIS — E785 Hyperlipidemia, unspecified: Secondary | ICD-10-CM | POA: Diagnosis not present

## 2022-05-12 DIAGNOSIS — E039 Hypothyroidism, unspecified: Secondary | ICD-10-CM | POA: Diagnosis not present

## 2022-05-12 DIAGNOSIS — F32A Depression, unspecified: Secondary | ICD-10-CM | POA: Diagnosis not present

## 2022-05-12 DIAGNOSIS — I10 Essential (primary) hypertension: Secondary | ICD-10-CM | POA: Diagnosis not present

## 2022-05-12 NOTE — Telephone Encounter (Signed)
Verbal orders given  

## 2022-05-12 NOTE — Telephone Encounter (Signed)
Amy, PT [facility] [phone] (secure)  Reason: de-conditioning, weakness, fall risk  Frequency: 2 x wk, 3wks                    1x wk, 3 wks

## 2022-05-16 ENCOUNTER — Telehealth: Payer: Self-pay | Admitting: Family

## 2022-05-16 NOTE — Progress Notes (Signed)
Done in ER.  Will discuss at hospital f/u visit

## 2022-05-16 NOTE — Progress Notes (Signed)
Not performed at my office.  Tresa Endo ,   Can you please have pt schedule a hospital f/u? Was in ER 8/7-8/8

## 2022-05-16 NOTE — Telephone Encounter (Signed)
Home Health verbal orders Caller Name: Shireen Agency Name: [facility]ol[facility] Idol number: [phone]  Requesting OT  Frequency: 1 week 5  Please forward to [facility] pool or providers CMA

## 2022-05-17 ENCOUNTER — Encounter: Payer: Self-pay | Admitting: Family

## 2022-05-17 ENCOUNTER — Telehealth: Payer: Self-pay | Admitting: Family

## 2022-05-17 ENCOUNTER — Ambulatory Visit (INDEPENDENT_AMBULATORY_CARE_PROVIDER_SITE_OTHER): Payer: Medicare Other | Admitting: Family

## 2022-05-17 VITALS — BP 138/74 | HR 75 | Temp 98.4°F | Resp 16 | Ht 61.0 in | Wt 192.0 lb

## 2022-05-17 DIAGNOSIS — I6522 Occlusion and stenosis of left carotid artery: Secondary | ICD-10-CM | POA: Insufficient documentation

## 2022-05-17 DIAGNOSIS — I671 Cerebral aneurysm, nonruptured: Secondary | ICD-10-CM

## 2022-05-17 DIAGNOSIS — R8290 Unspecified abnormal findings in urine: Secondary | ICD-10-CM | POA: Diagnosis not present

## 2022-05-17 DIAGNOSIS — R2990 Unspecified symptoms and signs involving the nervous system: Secondary | ICD-10-CM

## 2022-05-17 DIAGNOSIS — I6521 Occlusion and stenosis of right carotid artery: Secondary | ICD-10-CM | POA: Insufficient documentation

## 2022-05-17 LAB — POC URINALSYSI DIPSTICK (AUTOMATED)
Bilirubin, UA: POSITIVE
Blood, UA: NEGATIVE
Glucose, UA: NEGATIVE
Nitrite, UA: NEGATIVE
Protein, UA: POSITIVE — AB
Spec Grav, UA: 1.015 (ref 1.010–1.025)
Urobilinogen, UA: 1 E.U./dL
pH, UA: 6 (ref 5.0–8.0)

## 2022-05-17 NOTE — Telephone Encounter (Signed)
Forwarded to PCP.

## 2022-05-17 NOTE — Telephone Encounter (Signed)
Verbals order given. 

## 2022-05-17 NOTE — Telephone Encounter (Signed)
FYIFrances [name] [facility] sent over drug interaction. Thank you!

## 2022-05-17 NOTE — Progress Notes (Signed)
Established Patient Office Visit  Subjective:  Patient ID: Carmen [name], female    DOB: 07-30-1941  Age: 81 y.o. MRN: [id number]  CC:  Chief Complaint  Patient presents with  . Hospitalization Follow-up    HPI Carmen [name] is here for hospital follow up.   Hospital:  Admit: 8/7  Discharge:8/8  Discharge SW:FUXN, ca, stroke like symptoms vs seizure like activity, prolonged QT interval Discharge medications: set up with PT and OT. They sent her home with depakote 500 mgTID however she was drooling and sedated at that dose, so daughter decreased to 500 mg nightly. Still with slurring of speech and 'muddled' more than usual.    Low magnesium in the hospital, taking daily mag.   Acute concerns:  Went to Er as daugther was with her and she had suddenly become stiff, unresponsive andwith foam at the mouth. She then had a facial droop, right sided weakness, and slurred speech. Given narcan by EMS, no response. On eliqus, h/o cva.   Ekg: afib , does see Dr. Saunders Revel, cardiologist.   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 CT head without contrast: no acute changes, chronic CVA changes. Stenosis.   MRI head without contrast: no acute abn , remote frontal and left cerebellar infarcts and chronic microvascular ischemic disease.   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 63m.  Since discharge, with increasing weakness, leans slight to left since and for this has PT and OT.   Past Medical History:  Diagnosis Date  . Abnormal CT scan, lumbar spine (05/11/2021) 05/17/2021   (05/11/2021) LUMBAR CT FINDINGS: Alignment: Lumbar levocurvature, apex L4. Mild lateral listhesis L4 on L5 of approximately 3 mm. Vertebrae: Remote appearing superior endplate deformities at T12 with 10% height loss and L1 with up to 20% height loss. Multilevel discogenic and facet degenerative changes. Mild bilateral SI joint arthrosis.  DISC  LEVELS: T11-T12: Near complete disc height loss with de  . Abnormal MRI, cervical spine (05/12/2021) 05/17/2021   (05/12/2021) CERVICAL MRI FINDINGS: Motion artifact is present. Posterior Fossa, vertebral arteries, paraspinal tissues: Left superior cerebellar infarct.   DISC LEVELS: C2-C3: Disc bulge with endplate osteophytes. Uncovertebral and facet hypertrophy. C3-C4: Disc bulge with endplate osteophytes. Uncovertebral and facet hypertrophy. Mild canal stenosis. Marked foraminal stenosis. C4-C5: Disc bulge w  . Allergy   . Anemia   . Anxiety   . Arthritis   . Back pain   . Coronary artery disease   . Depression   . Hypertension   . Peripheral vascular disease ([place]   . Personal history of nicotine dependence 10/02/2020  . Prsnl hx of TIA (TIA), and cereb infrc w/o resid deficits 10/02/2020  . Stroke ([place]   . Thyroid disease     Past Surgical History:  Procedure Laterality Date  . back injections    . CARDIAC CATHETERIZATION    . CATARACT EXTRACTION    . CORONARY ANGIOPLASTY    . CORONARY ARTERY BYPASS GRAFT     2005    Family History  Problem Relation Age of Onset  . Breast cancer Mother   . Heart attack Father   . Bipolar disorder Daughter   . Diabetes Mellitus II Neg Hx     Social History   Socioeconomic History  . Marital status: Widowed    Spouse name: Not on file  . Number of children: 3  . Years of education: Not on file  . Highest  education level: Not on file  Occupational History  . Not on file  Tobacco Use  . Smoking status: Former    Years: 30.00    Types: Cigarettes    Quit date: 05/2020    Years since quitting: 2.0  . Smokeless tobacco: Never  . Tobacco comments:    Smoked about 1 pack per month  Vaping Use  . Vaping Use: Never used  Substance and Sexual Activity  . Alcohol use: Not Currently  . Drug use: Never  . Sexual activity: Not Currently  Other Topics Concern  . Not on file  Social History Narrative   ** Merged History Encounter **        Social Determinants of Health   Financial Resource Strain: Low Risk  (07/29/2021)   Overall Financial Resource Strain (CARDIA)   . Difficulty of Paying Living Expenses: Not hard at all  Food Insecurity: No Food Insecurity (02/25/2021)   Hunger Vital Sign   . Worried About Charity fundraiser in the Last Year: Never true   . Ran Out of Food in the Last Year: Never true  Transportation Needs: No Transportation Needs (02/25/2021)   PRAPARE - Transportation   . Lack of Transportation (Medical): No   . Lack of Transportation (Non-Medical): No  Physical Activity: Not on file  Stress: Not on file  Social Connections: Not on file  Intimate Partner Violence: Not on file    Outpatient Medications Prior to Visit  Medication Sig Dispense Refill  . apixaban (ELIQUIS) 5 MG TABS tablet Take 1 tablet (5 mg total) by mouth 2 (two) times daily. 180 tablet 3  . aspirin EC 81 MG tablet Take 81 mg by mouth daily. Swallow whole.    Marland Kitchen atorvastatin (LIPITOR) 40 MG tablet Take 1 tablet (40 mg total) by mouth daily. 90 tablet 1  . calcium carbonate (OSCAL) 1500 (600 Ca) MG TABS tablet Take 1 tablet (1,500 mg total) by mouth 2 (two) times daily with a meal. 60 tablet 2  . cetirizine (ZYRTEC) 10 MG tablet Take 10 mg by mouth daily.    . CHOLECALCIFEROL PO Take 1 capsule by mouth daily.    . divalproex (DEPAKOTE) 500 MG DR tablet Take 1 tablet (500 mg total) by mouth 3 (three) times daily. (Patient taking differently: Take 500 mg by mouth at bedtime.) 90 tablet 0  . hydrochlorothiazide (HYDRODIURIL) 25 MG tablet Take 1 tablet (25 mg total) by mouth daily. 90 tablet 3  . HYDROcodone-acetaminophen (NORCO/VICODIN) 5-325 MG tablet Take 1 tablet by mouth 2 (two) times daily as needed for severe pain. Must last 30 days. 60 tablet 0  . [START ON 05/30/2022] HYDROcodone-acetaminophen (NORCO/VICODIN) 5-325 MG tablet Take 1 tablet by mouth 2 (two) times daily as needed for severe pain. Must last 30 days. 60 tablet 0  .  [START ON 06/29/2022] HYDROcodone-acetaminophen (NORCO/VICODIN) 5-325 MG tablet Take 1 tablet by mouth 2 (two) times daily as needed for severe pain. Must last 30 days. 60 tablet 0  . levothyroxine (SYNTHROID) 100 MCG tablet Take 1 tablet (100 mcg total) by mouth daily before breakfast. 90 tablet 1  . magnesium oxide (MAG-OX) 400 MG tablet Take 1 tablet (400 mg total) by mouth daily. 30 tablet 0  . metoprolol succinate (TOPROL-XL) 100 MG 24 hr tablet Take 0.5 tablets (50 mg total) by mouth daily. 45 tablet 3  . traZODone (DESYREL) 100 MG tablet Take 1 tablet (100 mg total) by mouth at bedtime as needed for sleep. for  sleep 90 tablet 1  . clonazePAM (KLONOPIN) 0.5 MG tablet TAKE 1 TABLET BY MOUTH EVERY DAY AS NEEDED FOR ANXIETY (Patient not taking: Reported on 05/17/2022) 30 tablet 0   No facility-administered medications prior to visit.    No Known Allergies  ROS Review of Systems  Review of Systems  Respiratory:  Negative for shortness of breath.   Cardiovascular:  Negative for chest pain and palpitations.  Gastrointestinal:  Negative for constipation and diarrhea.  Genitourinary:  Negative for dysuria, frequency and urgency.  Musculoskeletal:  Negative for myalgias.  Psychiatric/Behavioral:  Negative for depression and suicidal ideas.   All other systems reviewed and are negative.    Objective:    Physical Exam  Gen: NAD, resting comfortably CV: RRR with no murmurs appreciated Pulm: NWOB, CTAB with no crackles, wheezes, or rhonchi Skin: warm, dry Psych: Normal affect and thought content  BP 138/74   Pulse 75   Temp 98.4 F (36.9 C)   Resp 16   Ht _0  (1.549 m)   Wt 192 lb (87.1 kg)   SpO2 95%   BMI 36.28 kg/m  Wt Readings from Last 3 Encounters:  05/17/22 192 lb (87.1 kg)  05/08/22 190 lb 0.6 oz (86.2 kg)  04/17/22 189 lb (85.7 kg)     Health Maintenance Due  Topic Date Due  . COVID-19 Vaccine (1) Never done  . TETANUS/TDAP  Never done  . Zoster Vaccines-  Shingrix (1 of 2) Never done  . DEXA SCAN  Never done  . INFLUENZA VACCINE  05/02/2022    There are no preventive care reminders to display for this patient.  Lab Results  Component Value Date   TSH 2.62 10/18/2021   Lab Results  Component Value Date   WBC 10.3 05/08/2022   HGB 13.9 05/08/2022   HGB 13.1 05/08/2022   HCT 41.0 05/08/2022   HCT 39.2 05/08/2022   MCV 92.7 05/08/2022   PLT 216 05/08/2022   Lab Results  Component Value Date   NA 136 05/08/2022   NA 137 05/08/2022   K 3.6 05/08/2022   K 3.6 05/08/2022   CO2 22 05/08/2022   GLUCOSE 135 (H) 05/08/2022   GLUCOSE 138 (H) 05/08/2022   BUN 15 05/08/2022   BUN 13 05/08/2022   CREATININE 0.90 05/08/2022   CREATININE 0.97 05/08/2022   BILITOT 0.7 05/08/2022   ALKPHOS 42 05/08/2022   AST 26 05/08/2022   ALT 20 05/08/2022   PROT 7.0 05/08/2022   ALBUMIN 4.3 05/08/2022   CALCIUM 9.6 05/08/2022   ANIONGAP 13 05/08/2022   EGFR 71 01/11/2022   GFR 67.85 10/18/2021   Lab Results  Component Value Date   CHOL 156 05/09/2022   Lab Results  Component Value Date   HDL 48 05/09/2022   Lab Results  Component Value Date   LDLCALC 83 05/09/2022   Lab Results  Component Value Date   TRIG 125 05/09/2022   Lab Results  Component Value Date   CHOLHDL 3.3 05/09/2022   Lab Results  Component Value Date   HGBA1C 6.3 (H) 05/08/2022      Assessment & Plan:   Problem List Items Addressed This Visit   None   No orders of the defined types were placed in this encounter.   Follow-up: No follow-ups on file.    Eugenia Pancoast, FNP

## 2022-05-17 NOTE — Progress Notes (Signed)
Any chance can we change the urine culture to stat?

## 2022-05-18 DIAGNOSIS — R8290 Unspecified abnormal findings in urine: Secondary | ICD-10-CM | POA: Insufficient documentation

## 2022-05-18 MED ORDER — DIVALPROEX SODIUM 500 MG PO DR TAB: 500.0000 mg | DELAYED_RELEASE_TABLET | Freq: Every evening | ORAL | 0 refills | Status: DC

## 2022-05-18 NOTE — Assessment & Plan Note (Signed)
poct urine dip in office, suspected UTI Urine culture ordered pending results

## 2022-05-18 NOTE — Assessment & Plan Note (Addendum)
Continue magnesium 400 mg daily. Will recheck in about one month

## 2022-05-18 NOTE — Assessment & Plan Note (Signed)
Referral to neurology. <7 mm which is good however will continue to monitor.

## 2022-05-18 NOTE — Assessment & Plan Note (Addendum)
Continue 500 mg depakote nightly as unable to tolerate higher dosing.  Spoke with neurologist, they are going to try to work her in sooner due to ICA, she will be expecting their call.   Did advise pt and daughter if any worsening stroke like sx and or seizure like activity call 911 and or go to Er.

## 2022-05-20 LAB — URINE CULTURE
MICRO NUMBER:: 13787851
SPECIMEN QUALITY:: ADEQUATE

## 2022-05-22 ENCOUNTER — Other Ambulatory Visit: Payer: Self-pay | Admitting: Family

## 2022-05-22 MED ORDER — CEPHALEXIN 500 MG PO CAPS: 500.0000 mg | ORAL_CAPSULE | Freq: Four times a day (QID) | ORAL | 0 refills | Status: AC

## 2022-05-22 NOTE — Progress Notes (Signed)
ph

## 2022-05-23 ENCOUNTER — Other Ambulatory Visit: Payer: Self-pay

## 2022-05-23 DIAGNOSIS — E785 Hyperlipidemia, unspecified: Secondary | ICD-10-CM

## 2022-05-23 MED ORDER — HYDROCHLOROTHIAZIDE 25 MG PO TABS: 25.0000 mg | ORAL_TABLET | Freq: Every day | ORAL | 0 refills | Status: DC

## 2022-05-23 MED ORDER — ATORVASTATIN CALCIUM 40 MG PO TABS: 40.0000 mg | ORAL_TABLET | Freq: Every day | ORAL | 0 refills | Status: DC

## 2022-05-23 MED ORDER — METOPROLOL SUCCINATE ER 100 MG PO TB24: 50.0000 mg | ORAL_TABLET | Freq: Every day | ORAL | 0 refills | Status: DC

## 2022-05-30 DIAGNOSIS — E039 Hypothyroidism, unspecified: Secondary | ICD-10-CM

## 2022-05-30 DIAGNOSIS — G894 Chronic pain syndrome: Secondary | ICD-10-CM

## 2022-05-30 DIAGNOSIS — M549 Dorsalgia, unspecified: Secondary | ICD-10-CM

## 2022-05-30 DIAGNOSIS — F419 Anxiety disorder, unspecified: Secondary | ICD-10-CM

## 2022-05-30 DIAGNOSIS — I10 Essential (primary) hypertension: Secondary | ICD-10-CM | POA: Diagnosis not present

## 2022-05-30 DIAGNOSIS — I739 Peripheral vascular disease, unspecified: Secondary | ICD-10-CM | POA: Diagnosis not present

## 2022-05-30 DIAGNOSIS — Z87891 Personal history of nicotine dependence: Secondary | ICD-10-CM

## 2022-05-30 DIAGNOSIS — Z9181 History of falling: Secondary | ICD-10-CM

## 2022-05-30 DIAGNOSIS — Z951 Presence of aortocoronary bypass graft: Secondary | ICD-10-CM

## 2022-05-30 DIAGNOSIS — D649 Anemia, unspecified: Secondary | ICD-10-CM

## 2022-05-30 DIAGNOSIS — Z7901 Long term (current) use of anticoagulants: Secondary | ICD-10-CM

## 2022-05-30 DIAGNOSIS — F32A Depression, unspecified: Secondary | ICD-10-CM

## 2022-05-30 DIAGNOSIS — E785 Hyperlipidemia, unspecified: Secondary | ICD-10-CM

## 2022-05-30 DIAGNOSIS — Z7983 Long term (current) use of bisphosphonates: Secondary | ICD-10-CM

## 2022-05-30 DIAGNOSIS — Z7982 Long term (current) use of aspirin: Secondary | ICD-10-CM

## 2022-05-30 DIAGNOSIS — I2510 Atherosclerotic heart disease of native coronary artery without angina pectoris: Secondary | ICD-10-CM | POA: Diagnosis not present

## 2022-05-30 DIAGNOSIS — I480 Paroxysmal atrial fibrillation: Secondary | ICD-10-CM | POA: Diagnosis not present

## 2022-06-06 ENCOUNTER — Ambulatory Visit: Payer: Medicare Other | Admitting: Family

## 2022-06-06 ENCOUNTER — Encounter: Payer: Self-pay | Admitting: Family

## 2022-06-06 ENCOUNTER — Ambulatory Visit (INDEPENDENT_AMBULATORY_CARE_PROVIDER_SITE_OTHER): Payer: Medicare Other | Admitting: Family

## 2022-06-06 VITALS — BP 116/78 | HR 56 | Temp 98.6°F | Resp 16 | Ht 61.0 in | Wt 187.2 lb

## 2022-06-06 DIAGNOSIS — Z9181 History of falling: Secondary | ICD-10-CM | POA: Insufficient documentation

## 2022-06-06 DIAGNOSIS — H6121 Impacted cerumen, right ear: Secondary | ICD-10-CM | POA: Insufficient documentation

## 2022-06-06 DIAGNOSIS — S6992XA Unspecified injury of left wrist, hand and finger(s), initial encounter: Secondary | ICD-10-CM | POA: Insufficient documentation

## 2022-06-06 DIAGNOSIS — Z8673 Personal history of transient ischemic attack (TIA), and cerebral infarction without residual deficits: Secondary | ICD-10-CM

## 2022-06-06 DIAGNOSIS — G894 Chronic pain syndrome: Secondary | ICD-10-CM

## 2022-06-06 NOTE — Assessment & Plan Note (Signed)
Bruising on left wrist with ROM.  Will not order xray at this time, advised pt to ice as needed and wear brace prn.

## 2022-06-06 NOTE — Assessment & Plan Note (Addendum)
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  Obtained verbal patient consent prior to procedure, possible risks of procedure discussed with pt prior, and then Wax was removed by syringing/irrigation. Instructions for home care to prevent wax buildup are given and handout provided to pt .pt tolerated procedure well.

## 2022-06-06 NOTE — Assessment & Plan Note (Signed)
recent fall, yesterday Monitor for s/s lethargy, confusion, headache and or blurry vision and report immediately.  Injured wrist, but with FROM and no pain on palpation.  Advised fall risk intervention, continue with PT with strength. Rise slowly upon standing. Use walker throughout home.

## 2022-06-06 NOTE — Progress Notes (Signed)
Established Patient Office Visit  Subjective:  Patient ID: Carmen [name], female    DOB: Nov 28, 1940  Age: 81 y.o. MRN: [id number]  CC:  Chief Complaint  Patient presents with   Leg Pain   Hearing Problem    HPI Carmen [name] is here today with concerns.   Feels fullness in right ear greater than left. Harder to hear out of then normal.  No pain to the ear but is with some discomfort.  She does wear hearing aids already.   [facility], pain management manages this with steroid injections. Overdue for injections, however, they were going to try to put her on a new procedure to help with pain. Authorization expired prior to the next appt, so they are trying to reach out to pain management as she had to reschedule the procedure due to her being in hospital for stroke.   Right now she is only using heating pad for pain as well as norco vicodin, and it is not helping and she is in a lot of pain.    Pt went up to go to bathroom, and her feet got stuck in her blanket and she tripped and fell forward. She landed on her left wrist which has some bruising. She did not hit her head. She fell yesterday in the daytime. She protected her head with her arms, and does not have a welt on the head. She does say when she fell her left hearing aid popped out. not feeling change in LOC, not dizzy. No headache. No change in vision.   H/o suspected stroke, referral placed for neurology.has appt 9/7.  Submitted 8/23 authorization, still pending return authorization. This is for radiofrequency for pain management.  They state they will reach out to her again later.  (I personally called and spoke with front desk at Dr. Adalberto Cole office)  Past Medical History:  Diagnosis Date   Abnormal CT scan, lumbar spine (05/11/2021) 05/17/2021   (05/11/2021) LUMBAR CT FINDINGS: Alignment: Lumbar levocurvature, apex L4. Mild lateral listhesis L4 on L5 of approximately 3 mm. Vertebrae: Remote appearing superior endplate  deformities at T12 with 10% height loss and L1 with up to 20% height loss. Multilevel discogenic and facet degenerative changes. Mild bilateral SI joint arthrosis.  DISC LEVELS: T11-T12: Near complete disc height loss with de   Abnormal MRI, cervical spine (05/12/2021) 05/17/2021   (05/12/2021) CERVICAL MRI FINDINGS: Motion artifact is present. Posterior Fossa, vertebral arteries, paraspinal tissues: Left superior cerebellar infarct.   DISC LEVELS: C2-C3: Disc bulge with endplate osteophytes. Uncovertebral and facet hypertrophy. C3-C4: Disc bulge with endplate osteophytes. Uncovertebral and facet hypertrophy. Mild canal stenosis. Marked foraminal stenosis. 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 10/02/2020   Stroke [facility])    Thyroid disease     Past Surgical History:  Procedure Laterality Date   back injections     CARDIAC CATHETERIZATION     CATARACT EXTRACTION     CORONARY ANGIOPLASTY     CORONARY ARTERY BYPASS GRAFT     2005    Family History  Problem Relation Age of Onset   Breast cancer Mother    Heart attack Father  Bipolar disorder Daughter    Diabetes Mellitus II Neg Hx     Social History   Socioeconomic History   Marital status: Widowed    Spouse name: Not on file   Number of children: 3   Years of education: Not on file   Highest education level: Not on file  Occupational History   Not on file  Tobacco Use   Smoking status: Former    Years: 30.00    Types: Cigarettes    Quit date: 05/2020    Years since quitting: 2.0   Smokeless tobacco: Never   Tobacco comments:    Smoked about 1 pack per month  Vaping Use   Vaping Use: Never used  Substance and Sexual Activity   Alcohol use: Not Currently   Drug use: Never    Sexual activity: Not Currently  Other Topics Concern   Not on file  Social History Narrative   ** Merged History Encounter **       Social Determinants of Health   Financial Resource Strain: Low Risk  (07/29/2021)   Overall Financial Resource Strain (CARDIA)    Difficulty of Paying Living Expenses: Not hard at all  Food Insecurity: No Food Insecurity (02/25/2021)   Hunger Vital Sign    Worried About Running Out of Food in the Last Year: Never true    [place] in the Last Year: Never true  Transportation Needs: No Transportation Needs (02/25/2021)   PRAPARE - Hydrologist (Medical): No    Lack of Transportation (Non-Medical): No  Physical Activity: Not on file  Stress: Not on file  Social Connections: Not on file  Intimate Partner Violence: Not on file    Outpatient Medications Prior to Visit  Medication Sig Dispense Refill   apixaban (ELIQUIS) 5 MG TABS tablet Take 1 tablet (5 mg total) by mouth 2 (two) times daily. 180 tablet 3   aspirin EC 81 MG tablet Take 81 mg by mouth daily. Swallow whole.     atorvastatin (LIPITOR) 40 MG tablet Take 1 tablet (40 mg total) by mouth daily. 90 tablet 0   calcium carbonate (OSCAL) 1500 (600 Ca) MG TABS tablet Take 1 tablet (1,500 mg total) by mouth 2 (two) times daily with a meal. 60 tablet 2   cetirizine (ZYRTEC) 10 MG tablet Take 10 mg by mouth daily.     CHOLECALCIFEROL PO Take 1 capsule by mouth daily.     divalproex (DEPAKOTE) 500 MG DR tablet Take 1 tablet (500 mg total) by mouth at bedtime. 30 tablet 0   hydrochlorothiazide (HYDRODIURIL) 25 MG tablet Take 1 tablet (25 mg total) by mouth daily. 90 tablet 0   HYDROcodone-acetaminophen (NORCO/VICODIN) 5-325 MG tablet Take 1 tablet by mouth 2 (two) times daily as needed for severe pain. Must last 30 days. 60 tablet 0   [START ON 06/29/2022] HYDROcodone-acetaminophen (NORCO/VICODIN) 5-325 MG tablet Take 1 tablet by mouth 2 (two) times daily as needed for  severe pain. Must last 30 days. 60 tablet 0   levothyroxine (SYNTHROID) 100 MCG tablet Take 1 tablet (100 mcg total) by mouth daily before breakfast. 90 tablet 1   magnesium oxide (MAG-OX) 400 MG tablet Take 1 tablet (400 mg total) by mouth daily. 30 tablet 0   metoprolol succinate (TOPROL-XL) 100 MG 24 hr tablet Take 0.5 tablets (50 mg total) by mouth daily. 45 tablet 0   traZODone (DESYREL) 100 MG tablet Take 1 tablet (100 mg total) by  mouth at bedtime as needed for sleep. for sleep 90 tablet 1   HYDROcodone-acetaminophen (NORCO/VICODIN) 5-325 MG tablet Take 1 tablet by mouth 2 (two) times daily as needed for severe pain. Must last 30 days. 60 tablet 0   No facility-administered medications prior to visit.    No Known Allergies      Objective:    Physical Exam Constitutional:      General: She is not in acute distress.    Appearance: Normal appearance. She is obese. She is not ill-appearing, toxic-appearing or diaphoretic.  HENT:     Right Ear: There is impacted cerumen.     Left Ear:  No middle ear effusion. There is no impacted cerumen. 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  Pulmonary:     Effort: Pulmonary effort is normal.  Neurological:     General: No focal deficit present.     Mental Status: She is alert and oriented to person, place, and time. Mental status is at baseline.     Cranial Nerves: No cranial nerve deficit.     Motor: No weakness (using cane).     Gait: Gait normal.  Psychiatric:        Mood and Affect: Mood normal.        Behavior: Behavior normal.        Thought Content: Thought content normal.        Judgment: Judgment normal.     BP 116/78   Pulse (!) 56   Temp 98.6 F (37 C)   Resp 16   Ht 5' 1"  (1.549 m)   Wt 187 lb 4 oz (84.9 kg)   SpO2 94%   BMI 35.38 kg/m  Wt Readings from Last 3 Encounters:  06/06/22 187 lb 4 oz (84.9 kg)  05/17/22 192 lb (87.1 kg)  05/08/22 190 lb 0.6 oz (86.2 kg)     Health Maintenance Due  Topic Date Due    COVID-19 Vaccine (1) Never done   TETANUS/TDAP  Never done   Zoster Vaccines- Shingrix (1 of 2) Never done   DEXA SCAN  Never done   INFLUENZA VACCINE  Never done    There are no preventive care reminders to display for this patient.  Lab Results  Component Value Date   TSH 2.62 10/18/2021   Lab Results  Component Value Date   WBC 10.3 05/08/2022   HGB 13.9 05/08/2022   HGB 13.1 05/08/2022   HCT 41.0 05/08/2022   HCT 39.2 05/08/2022   MCV 92.7 05/08/2022   PLT 216 05/08/2022   Lab Results  Component Value Date   NA 136 05/08/2022   NA 137 05/08/2022   K 3.6 05/08/2022   K 3.6 05/08/2022   CO2 22 05/08/2022   GLUCOSE 135 (H) 05/08/2022   GLUCOSE 138 (H) 05/08/2022   BUN 15 05/08/2022   BUN 13 05/08/2022   CREATININE 0.90 05/08/2022   CREATININE 0.97 05/08/2022   BILITOT 0.7 05/08/2022   ALKPHOS 42 05/08/2022   AST 26 05/08/2022   ALT 20 05/08/2022   PROT 7.0 05/08/2022   ALBUMIN 4.3 05/08/2022   CALCIUM 9.6 05/08/2022   ANIONGAP 13 05/08/2022   EGFR 71 01/11/2022   GFR 67.85 10/18/2021   Lab Results  Component Value Date   HGBA1C 6.3 (H) 05/08/2022      Assessment & Plan:   Problem List Items Addressed This Visit       Nervous and Auditory   Hearing loss of right ear due to cerumen  impaction - Primary    Irrigation in office Advised pt on not using Q tips  Can use daily hydrogen peroxide mixed with water in one cap daily instead of Qtips  Do not use in left ear due to chronic perforation  Ceruminosis is noted.  Obtained verbal patient consent prior to procedure, possible risks of procedure discussed with pt prior, and then Wax was removed by syringing/irrigation. Instructions for home care to prevent wax buildup are given and handout provided to pt .pt tolerated procedure well.         Other   Chronic pain syndrome (Chronic)    Called and spoke with front office staff with pain management, and they stated Josem Kaufmann was put in again on 8/23 and  pending approval. They stated they will be reaching out to pt later today.      History of stroke    Continue eliquis as prescribed      History of recent fall    recent fall, yesterday Monitor for s/s lethargy, confusion, headache and or blurry vision and report immediately.  Injured wrist, but with FROM and no pain on palpation.  Advised fall risk intervention, continue with PT with strength. Rise slowly upon standing. Use walker throughout home.       Injury of left wrist    Bruising on left wrist with ROM.  Will not order xray at this time, advised pt to ice as needed and wear brace prn.        No orders of the defined types were placed in this encounter.   Follow-up: Return in about 3 months (around 09/05/2022) for follow up , office visit.    Eugenia Pancoast, FNP

## 2022-06-06 NOTE — Assessment & Plan Note (Signed)
Called and spoke with front office staff with pain management, and they stated [name] was put in again on 8/23 and pending approval. They stated they will be reaching out to pt later today.

## 2022-06-06 NOTE — Assessment & Plan Note (Signed)
Continue eliquis as prescribed

## 2022-06-08 ENCOUNTER — Ambulatory Visit: Payer: Medicare Other | Admitting: Neurology

## 2022-06-08 ENCOUNTER — Telehealth: Payer: Self-pay | Admitting: Family

## 2022-06-08 VITALS — BP 168/115 | HR 96 | Ht 61.0 in | Wt 187.0 lb

## 2022-06-08 DIAGNOSIS — I482 Chronic atrial fibrillation, unspecified: Secondary | ICD-10-CM

## 2022-06-08 DIAGNOSIS — R413 Other amnesia: Secondary | ICD-10-CM | POA: Diagnosis not present

## 2022-06-08 DIAGNOSIS — G459 Transient cerebral ischemic attack, unspecified: Secondary | ICD-10-CM | POA: Diagnosis not present

## 2022-06-08 DIAGNOSIS — Z8673 Personal history of transient ischemic attack (TIA), and cerebral infarction without residual deficits: Secondary | ICD-10-CM

## 2022-06-08 DIAGNOSIS — R55 Syncope and collapse: Secondary | ICD-10-CM

## 2022-06-08 DIAGNOSIS — G3184 Mild cognitive impairment, so stated: Secondary | ICD-10-CM

## 2022-06-08 NOTE — Progress Notes (Signed)
[facility] [address] [place]. [place] [address] [phone]       OFFICE CONSULT NOTE  [name] Date of Birth:  1940-10-03 Medical Record Number:  [id number]   Referring MD:  Barb Merino  Reason for Referral: Strokelike episode  HPI: Carmen [name] is a 81 year old pleasant Caucasian lady seen today for initial office consultation visit for strokelike episode.  She is accompanied by her daughter.  History is obtained from them and review of electronic medical records and I personally reviewed pertinent available imaging films in PACS.  She has past medical history of chronic pain, remote stroke, depression, peripheral vascular disease, hypothyroidism, anemia, coronary artery disease, anxiety, chronic A-fib on long-term Eliquis.  She presented on 05/08/2022 from assisted living facility where her daughter was helping her back up for an appointment when all of a sudden she became stiff and staring and began breathing heavily and foaming at the mouth and was unresponsive.  Her daughter was able to catch her and help lower her gradually to the floor where she remained unresponsive for 5 to 10 minutes.  She was noted to have facial droop and right-sided weakness and some slurred speech when she started speaking.  CT head on admission was unremarkable.  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  There was noncalcified plaque of the right carotid with less than 50% stenosis.  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  2D echo showed ejection fraction of 70 to 75%.  LDL cholesterol was 83 mg percent and hemoglobin A1c 6.3.  EEG was obtained but it was normal and did not show any epileptiform activity.  Patient was continued on his Eliquis and discharged back to assisted living facility.  The daughter  states that she is doing well she has had no further recurrent episodes.  She has had no other stroke or TIA symptoms.  She is tolerating Eliquis well without bleeding or bruising.  She did have a history of stroke 2 years ago while she was in [place] and was in atrial fibrillation but at that time was not on anticoagulation.  She has had some balance problems remaining from that stroke.  She also had a second stroke following which she has had short-term memory difficulties which have persisted.  She is living in assisted living facility but is mostly independent.  She is able to ambulate with a cane.  She is currently getting physical therapy which seems to have helped.  She has chronic back pain and gets pain injections at the pain clinic in her back.  She remains on Lipitor which is tolerating well without side effects.  Her blood pressure is usually well controlled but today it is elevated at 168/115 in the office.  ROS:   14 system review of systems is positive for loss of consciousness, stiffness, slurred speech, facial droop, weakness, gait imbalance, falls, memory loss all other systems negative  PMH:  Past Medical History:  Diagnosis Date   Abnormal CT scan, lumbar spine (05/11/2021) 05/17/2021   (05/11/2021) LUMBAR CT FINDINGS: Alignment: Lumbar levocurvature, apex L4. Mild lateral listhesis L4 on L5 of approximately 3 mm. Vertebrae: Remote appearing superior endplate deformities at T12 with 10% height loss and L1 with up to 20% height loss. Multilevel discogenic and facet degenerative changes. Mild bilateral SI joint arthrosis.  DISC LEVELS:  T11-T12: Near complete disc height loss with de   Abnormal MRI, cervical spine (05/12/2021) 05/17/2021   (05/12/2021) CERVICAL MRI FINDINGS: Motion artifact is present. Posterior Fossa, vertebral arteries, paraspinal tissues: Left superior cerebellar infarct.   DISC LEVELS: C2-C3: Disc bulge with endplate osteophytes. Uncovertebral and facet hypertrophy.  C3-C4: Disc bulge with endplate osteophytes. Uncovertebral and facet hypertrophy. Mild canal stenosis. Marked foraminal stenosis. 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 10/02/2020   Stroke [facility])    Thyroid disease     Social History:  Social History   Socioeconomic History   Marital status: Widowed    Spouse name: Not on file   Number of children: 3   Years of education: Not on file   Highest education level: Not on file  Occupational History   Not on file  Tobacco Use   Smoking status: Former    Years: 30.00    Types: Cigarettes    Quit date: 05/2020    Years since quitting: 2.1   Smokeless tobacco: Never   Tobacco comments:    Smoked about 1 pack per month  Vaping Use   Vaping Use: Never used  Substance and Sexual Activity   Alcohol use: Not Currently   Drug use: Never   Sexual activity: Not Currently  Other Topics Concern   Not on file  Social History Narrative   ** Merged History Encounter **       Social Determinants of Health   Financial Resource Strain: Low Risk  (07/29/2021)   Overall Financial Resource Strain (CARDIA)    Difficulty of Paying Living Expenses: Not hard at all  Food Insecurity: No Food Insecurity (02/25/2021)   Hunger Vital Sign    Worried About Running Out of Food in the Last Year: Never true    [place] in the Last Year: Never true  Transportation Needs: No Transportation Needs (02/25/2021)   PRAPARE - Hydrologist (Medical): No    Lack of Transportation (Non-Medical): No  Physical Activity: Not on file  Stress: Not on file  Social Connections: Not on file  Intimate Partner Violence: Not on file    Medications:   Current Outpatient Medications on File  Prior to Visit  Medication Sig Dispense Refill   apixaban (ELIQUIS) 5 MG TABS tablet Take 1 tablet (5 mg total) by mouth 2 (two) times daily. 180 tablet 3   aspirin EC 81 MG tablet Take 81 mg by mouth daily. Swallow whole.     atorvastatin (LIPITOR) 40 MG tablet Take 1 tablet (40 mg total) by mouth daily. 90 tablet 0   calcium carbonate (OSCAL) 1500 (600 Ca) MG TABS tablet Take 1 tablet (1,500 mg total) by mouth 2 (two) times daily with a meal. 60 tablet 2   cetirizine (ZYRTEC) 10 MG tablet Take 10 mg by mouth daily.     CHOLECALCIFEROL PO Take 1 capsule by mouth daily.     divalproex (DEPAKOTE) 500 MG DR tablet Take 1 tablet (500 mg total) by mouth at bedtime. 30 tablet 0   hydrochlorothiazide (HYDRODIURIL) 25 MG tablet Take 1 tablet (25 mg total) by  mouth daily. 90 tablet 0   HYDROcodone-acetaminophen (NORCO/VICODIN) 5-325 MG tablet Take 1 tablet by mouth 2 (two) times daily as needed for severe pain. Must last 30 days. 60 tablet 0   [START ON 06/29/2022] HYDROcodone-acetaminophen (NORCO/VICODIN) 5-325 MG tablet Take 1 tablet by mouth 2 (two) times daily as needed for severe pain. Must last 30 days. 60 tablet 0   levothyroxine (SYNTHROID) 100 MCG tablet Take 1 tablet (100 mcg total) by mouth daily before breakfast. 90 tablet 1   magnesium oxide (MAG-OX) 400 MG tablet Take 1 tablet (400 mg total) by mouth daily. 30 tablet 0   metoprolol succinate (TOPROL-XL) 100 MG 24 hr tablet Take 0.5 tablets (50 mg total) by mouth daily. 45 tablet 0   traZODone (DESYREL) 100 MG tablet Take 1 tablet (100 mg total) by mouth at bedtime as needed for sleep. for sleep 90 tablet 1   HYDROcodone-acetaminophen (NORCO/VICODIN) 5-325 MG tablet Take 1 tablet by mouth 2 (two) times daily as needed for severe pain. Must last 30 days. 60 tablet 0   No current facility-administered medications on file prior to visit.    Allergies:  No Known Allergies  Physical Exam General: well developed, well nourished pleasant elderly  Caucasian lady, seated, in no evident distress Head: head normocephalic and atraumatic.   Neck: supple with no carotid or supraclavicular bruits Cardiovascular: regular rate and rhythm, no murmurs Musculoskeletal: no deformity Skin:  no rash/petichiae Vascular:  Normal pulses all extremities  Neurologic Exam Mental Status: Awake and fully alert. Oriented to place and time. Recent and remote memory intact. Attention span, concentration and fund of knowledge appropriate. Mood and affect appropriate.  Diminished recall 1/3.  Able to name only 5 animals which can walk on 4 legs.  Clock drawing 3/4. Cranial Nerves: Fundoscopic exam reveals sharp disc margins. Pupils equal, briskly reactive to light. Extraocular movements full without nystagmus. Visual fields full to confrontation. Hearing is diminished bilaterally. Facial sensation intact. Face, tongue, palate moves normally and symmetrically.  Motor: Normal bulk and tone. Normal strength in all tested extremity muscles. Sensory.: intact to touch , pinprick , position and vibratory sensation.  Coordination: Rapid alternating movements normal in all extremities. Finger-to-nose and heel-to-shin performed accurately bilaterally. Gait and Station: Arises from chair without difficulty. Stance is broad-based l.  Uses a cane.  Gait is imbalance particularly while turning  reflexes: 1+ and symmetric. Toes downgoing.   NIHSS  1 Modified Rankin  1   ASSESSMENT: 81 year old Caucasian lady with brief episode of altered consciousness with generalized body stiffness and foaming of the mouth followed by transient facial droop slurred speech and right-sided weakness-strokelike episode unclear as to syncope versus seizure or TIA.  Vascular risk factors of chronic atrial fibrillation on anticoagulation with Eliquis, hypertension, hyperlipidemia and mild obesity.  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     PLAN: I had a long  discussion with the patient and her daughter regarding her episode of brief unresponsiveness and right-sided weakness and discussed differential diagnosis including TIA versus syncopal event versus seizure and recommend further evaluation by checking EEG, 30-day heart monitor and memory panel labs.  Continue Eliquis for stroke prevention given history of A-fib but may consider possible participation in the [place]. A-fib study if interested in the future.  Maintain aggressive risk factor modification with strict control of hypertension with blood pressure goal below 130/90, lipids with LDL cholesterol goal below 70 mg percent and diabetes with hemoglobin A1c goal below 6.5%.  I encouraged patient to increase participation in cognitively challenging activities like solving crossword puzzles, playing bridge and sudoku.  We also discussed memory compensation strategies.  I encouraged her to continue ongoing physical therapy and use a cane at all times and discussed fall safety precautions.  Return for follow-up in the future in 3 months or call earlier if necessary. Greater than 50% time during this prolonged 60-minute consultation visit was spent in counseling and coordination of care about her strokelike event syncopal event and mild cognitive impairment discussion with patient and family and answering questions Delia Heady, MD Note: This document was prepared with digital dictation and possible smart phrase technology. Any transcriptional errors that result from this process are unintentional.

## 2022-06-08 NOTE — Patient Instructions (Signed)
I had a long discussion with the patient and her daughter regarding her episode of brief unresponsiveness and right-sided weakness and discussed differential diagnosis including TIA versus syncopal event versus seizure and recommend further evaluation by checking EEG, 30-day heart monitor and memory panel labs.  Continue Eliquis for stroke prevention given history of A-fib but may consider possible participation in the [place]. A-fib study if interested in the future.  Maintain aggressive risk factor modification with strict control of hypertension with blood pressure goal below 130/90, lipids with LDL cholesterol goal below 70 mg percent and diabetes with hemoglobin A1c goal below 6.5%.  I encouraged patient to increase participation in cognitively challenging activities like solving crossword puzzles, playing bridge and sudoku.  We also discussed memory compensation strategies.  I encouraged her to continue ongoing physical therapy and use a cane at all times and discussed fall safety precautions.  Return for follow-up in the future in 3 months or call earlier if necessary.  Stroke Prevention Some medical conditions and behaviors can lead to a higher chance of having a stroke. You can help prevent a stroke by eating healthy, exercising, not smoking, and managing any medical conditions you have. Stroke is a leading cause of functional impairment. Primary prevention is particularly important because a majority of strokes are first-time events. Stroke changes the lives of not only those who experience a stroke but also their family and other caregivers. How can this condition affect me? A stroke is a medical emergency and should be treated right away. A stroke can lead to brain damage and can sometimes be life-threatening. If a person gets medical treatment right away, there is a better chance of surviving and recovering from a stroke. What can increase my risk? The following medical conditions may increase  your risk of a stroke: Cardiovascular disease. High blood pressure (hypertension). Diabetes. High cholesterol. Sickle cell disease. Blood clotting disorders (hypercoagulable state). Obesity. Sleep disorders (obstructive sleep apnea). Other risk factors include: Being older than age 3. Having a history of blood clots, stroke, or mini-stroke (transient ischemic attack, TIA). Genetic factors, such as race, ethnicity, or a family history of stroke. Smoking cigarettes or using other tobacco products. Taking birth control pills, especially if you also use tobacco. Heavy use of alcohol or drugs, especially cocaine and methamphetamine. Physical inactivity. What actions can I take to prevent this? Manage your health conditions High cholesterol levels. Eating a healthy diet is important for preventing high cholesterol. If cholesterol cannot be managed through diet alone, you may need to take medicines. Take any prescribed medicines to control your cholesterol as told by your health care provider. Hypertension. To reduce your risk of stroke, try to keep your blood pressure below 130/80. Eating a healthy diet and exercising regularly are important for controlling blood pressure. If these steps are not enough to manage your blood pressure, you may need to take medicines. Take any prescribed medicines to control hypertension as told by your health care provider. Ask your health care provider if you should monitor your blood pressure at home. Have your blood pressure checked every year, even if your blood pressure is normal. Blood pressure increases with age and some medical conditions. Diabetes. Eating a healthy diet and exercising regularly are important parts of managing your blood sugar (glucose). If your blood sugar cannot be managed through diet and exercise, you may need to take medicines. Take any prescribed medicines to control your diabetes as told by your health care provider. Get  evaluated for obstructive  sleep apnea. Talk to your health care provider about getting a sleep evaluation if you snore a lot or have excessive sleepiness. Make sure that any other medical conditions you have, such as atrial fibrillation or atherosclerosis, are managed. Nutrition Follow instructions from your health care provider about what to eat or drink to help manage your health condition. These instructions may include: Reducing your daily calorie intake. Limiting how much salt (sodium) you use to 1,500 milligrams (mg) each day. Using only healthy fats for cooking, such as olive oil, canola oil, or sunflower oil. Eating healthy foods. You can do this by: Choosing foods that are high in fiber, such as whole grains, and fresh fruits and vegetables. Eating at least 5 servings of fruits and vegetables a day. Try to fill one-half of your plate with fruits and vegetables at each meal. Choosing lean protein foods, such as lean cuts of meat, poultry without skin, fish, tofu, beans, and nuts. Eating low-fat dairy products. Avoiding foods that are high in sodium. This can help lower blood pressure. Avoiding foods that have saturated fat, trans fat, and cholesterol. This can help prevent high cholesterol. Avoiding processed and prepared foods. Counting your daily carbohydrate intake.  Lifestyle If you drink alcohol: Limit how much you have to: 0-1 drink a day for women who are not pregnant. 0-2 drinks a day for men. Know how much alcohol is in your drink. In the [place] one drink equals one 12 oz bottle of beer ( ), one 5 oz glass of wine ( ), or one 1 oz glass of hard liquor (34mL). Do not use any products that contain nicotine or tobacco. These products include cigarettes, chewing tobacco, and vaping devices, such as e-cigarettes. If you need help quitting, ask your health care provider. Avoid secondhand smoke. Do not use drugs. Activity  Try to stay at a healthy weight. Get at least  30 minutes of exercise on most days, such as: Fast walking. Biking. Swimming. Medicines Take over-the-counter and prescription medicines only as told by your health care provider. Aspirin or blood thinners (antiplatelets or anticoagulants) may be recommended to reduce your risk of forming blood clots that can lead to stroke. Avoid taking birth control pills. Talk to your health care provider about the risks of taking birth control pills if: You are over 61 years old. You smoke. You get very bad headaches. You have had a blood clot. Where to find more information American Stroke Association: www.strokeassociation.org Get help right away if: You or a loved one has any symptoms of a stroke. "BE FAST" is an easy way to remember the main warning signs of a stroke: B - Balance. Signs are dizziness, sudden trouble walking, or loss of balance. E - Eyes. Signs are trouble seeing or a sudden change in vision. F - Face. Signs are sudden weakness or numbness of the face, or the face or eyelid drooping on one side. A - Arms. Signs are weakness or numbness in an arm. This happens suddenly and usually on one side of the body. S - Speech. Signs are sudden trouble speaking, slurred speech, or trouble understanding what people say. T - Time. Time to call emergency services. Write down what time symptoms started. You or a loved one has other signs of a stroke, such as: A sudden, severe headache with no known cause. Nausea or vomiting. Seizure. These symptoms may represent a serious problem that is an emergency. Do not wait to see if the symptoms will go away. Get medical  help right away. Call your local emergency services (911 in the [place] Do not drive yourself to the hospital. Summary You can help to prevent a stroke by eating healthy, exercising, not smoking, limiting alcohol intake, and managing any medical conditions you may have. Do not use any products that contain nicotine or tobacco. These include  cigarettes, chewing tobacco, and vaping devices, such as e-cigarettes. If you need help quitting, ask your health care provider. Remember "BE FAST" for warning signs of a stroke. Get help right away if you or a loved one has any of these signs. This information is not intended to replace advice given to you by your health care provider. Make sure you discuss any questions you have with your health care provider. Document Revised: 04/19/2020 Document Reviewed: 04/19/2020 Elsevier Patient Education  [place].

## 2022-06-08 NOTE — Telephone Encounter (Signed)
Amy PT from [facility] called in wants to make PCP aware that pt had a fall on 06/06/22 stated pt has soreness and bruise on left wrist # [phone]

## 2022-06-09 LAB — DEMENTIA PANEL
Homocysteine: 13.3 umol/L (ref 0.0–19.2)
RPR Ser Ql: NONREACTIVE
TSH: 8.5 u[IU]/mL — ABNORMAL HIGH (ref 0.450–4.500)
Vitamin B-12: 647 pg/mL (ref 232–1245)

## 2022-06-09 NOTE — Telephone Encounter (Signed)
Can we call patient and see if this fall occurred after her appointment on September 5 or did occur prior?  I know she had a bruise on her left wrist when I saw her in the office and her daughter had stated that she had had a recent fall.  If it is a new fall and needs to be evaluated please have her come in

## 2022-06-09 NOTE — Telephone Encounter (Signed)
It was before you saw her. That's is the bruise you seen. Daughter said she don't know why they are just now calling.

## 2022-06-09 NOTE — Progress Notes (Signed)
Kindly inform the patient that all lab results are not back yet but thyroid hormone is elevated and he needs to see his primary care physician to discuss medications for his underactive thyroid.  Vitamin B12 level and test for syphilis are okay

## 2022-06-12 ENCOUNTER — Other Ambulatory Visit: Payer: Self-pay | Admitting: Family

## 2022-06-12 DIAGNOSIS — E039 Hypothyroidism, unspecified: Secondary | ICD-10-CM

## 2022-06-12 MED ORDER — LEVOTHYROXINE SODIUM 112 MCG PO TABS: 112.0000 ug | ORAL_TABLET | Freq: Every day | ORAL | 0 refills | Status: DC

## 2022-06-12 NOTE — Progress Notes (Signed)
Left message to return call to our office.  

## 2022-06-12 NOTE — Progress Notes (Unsigned)
From recent labs from the specialist the thyroid was elevated.  I am going to increase her levothyroxine to 112 once daily.  Have her make a 6-week follow-up lab only appointment for repeat thyroid levels.

## 2022-06-13 ENCOUNTER — Ambulatory Visit
Admission: RE | Admit: 2022-06-13 | Discharge: 2022-06-13 | Disposition: A | Discharge status: Home / Self Care | Payer: Medicare Other | Source: Ambulatory Visit | Attending: Pain Medicine | Admitting: Pain Medicine

## 2022-06-13 ENCOUNTER — Encounter: Payer: Self-pay | Admitting: Pain Medicine

## 2022-06-13 ENCOUNTER — Telehealth: Payer: Self-pay | Admitting: Pain Medicine

## 2022-06-13 ENCOUNTER — Telehealth: Payer: Self-pay

## 2022-06-13 ENCOUNTER — Ambulatory Visit: Payer: Medicare Other | Attending: Pain Medicine | Admitting: Pain Medicine

## 2022-06-13 VITALS — BP 150/90 | HR 96 | Temp 97.2°F | Resp 19 | Ht 61.0 in | Wt 187.0 lb

## 2022-06-13 DIAGNOSIS — M51379 Other intervertebral disc degeneration, lumbosacral region without mention of lumbar back pain or lower extremity pain: Secondary | ICD-10-CM

## 2022-06-13 DIAGNOSIS — M5137 Other intervertebral disc degeneration, lumbosacral region: Secondary | ICD-10-CM | POA: Insufficient documentation

## 2022-06-13 DIAGNOSIS — Z7901 Long term (current) use of anticoagulants: Secondary | ICD-10-CM

## 2022-06-13 DIAGNOSIS — E66812 Obesity, class 2: Secondary | ICD-10-CM

## 2022-06-13 DIAGNOSIS — G8929 Other chronic pain: Secondary | ICD-10-CM

## 2022-06-13 DIAGNOSIS — M545 Low back pain, unspecified: Secondary | ICD-10-CM | POA: Diagnosis not present

## 2022-06-13 DIAGNOSIS — Z5189 Encounter for other specified aftercare: Secondary | ICD-10-CM

## 2022-06-13 DIAGNOSIS — M47816 Spondylosis without myelopathy or radiculopathy, lumbar region: Secondary | ICD-10-CM

## 2022-06-13 DIAGNOSIS — M4310 Spondylolisthesis, site unspecified: Secondary | ICD-10-CM | POA: Diagnosis not present

## 2022-06-13 DIAGNOSIS — E669 Obesity, unspecified: Secondary | ICD-10-CM | POA: Insufficient documentation

## 2022-06-13 DIAGNOSIS — M47817 Spondylosis without myelopathy or radiculopathy, lumbosacral region: Secondary | ICD-10-CM | POA: Diagnosis not present

## 2022-06-13 DIAGNOSIS — G8918 Other acute postprocedural pain: Secondary | ICD-10-CM | POA: Diagnosis not present

## 2022-06-13 DIAGNOSIS — M4820 Kissing spine, site unspecified: Secondary | ICD-10-CM | POA: Insufficient documentation

## 2022-06-13 MED ORDER — OXYCODONE-ACETAMINOPHEN 2.5-325 MG PO TABS: 1.0000 | ORAL_TABLET | Freq: Three times a day (TID) | ORAL | 0 refills | Status: DC | PRN

## 2022-06-13 MED ORDER — ROPIVACAINE HCL 2 MG/ML IJ SOLN
9.0000 mL | Freq: Once | INTRAMUSCULAR | Status: AC
  Administered 2022-06-13: 9 mL via PERINEURAL

## 2022-06-13 MED ORDER — FENTANYL CITRATE (PF) 100 MCG/2ML IJ SOLN
INTRAMUSCULAR | Status: AC
  Filled 2022-06-13: qty 2

## 2022-06-13 MED ORDER — MIDAZOLAM HCL 5 MG/5ML IJ SOLN
0.5000 mg | Freq: Once | INTRAMUSCULAR | Status: AC
  Administered 2022-06-13: 1 mg via INTRAVENOUS

## 2022-06-13 MED ORDER — TRIAMCINOLONE ACETONIDE 40 MG/ML IJ SUSP
INTRAMUSCULAR | Status: AC
  Filled 2022-06-13: qty 1

## 2022-06-13 MED ORDER — OXYCODONE-ACETAMINOPHEN 5-325 MG PO TABS: 0.5000 | ORAL_TABLET | Freq: Three times a day (TID) | ORAL | 0 refills | Status: DC | PRN

## 2022-06-13 MED ORDER — MIDAZOLAM HCL 5 MG/5ML IJ SOLN
INTRAMUSCULAR | Status: AC
  Filled 2022-06-13: qty 5

## 2022-06-13 MED ORDER — ROPIVACAINE HCL 2 MG/ML IJ SOLN
INTRAMUSCULAR | Status: AC
  Filled 2022-06-13: qty 20

## 2022-06-13 MED ORDER — LIDOCAINE HCL 2 % IJ SOLN
20.0000 mL | Freq: Once | INTRAMUSCULAR | Status: AC
  Administered 2022-06-13: 400 mg

## 2022-06-13 MED ORDER — LIDOCAINE HCL 2 % IJ SOLN
INTRAMUSCULAR | Status: AC
  Filled 2022-06-13: qty 20

## 2022-06-13 MED ORDER — PENTAFLUOROPROP-TETRAFLUOROETH EX AERO
INHALATION_SPRAY | Freq: Once | CUTANEOUS | Status: DC
  Filled 2022-06-13: qty 116

## 2022-06-13 MED ORDER — FENTANYL CITRATE (PF) 100 MCG/2ML IJ SOLN
25.0000 ug | INTRAMUSCULAR | Status: DC | PRN
  Administered 2022-06-13: 50 ug via INTRAVENOUS

## 2022-06-13 MED ORDER — LACTATED RINGERS IV SOLN: Freq: Once | INTRAVENOUS | Status: AC

## 2022-06-13 MED ORDER — TRIAMCINOLONE ACETONIDE 40 MG/ML IJ SUSP
40.0000 mg | Freq: Once | INTRAMUSCULAR | Status: AC
  Administered 2022-06-13: 40 mg

## 2022-06-13 NOTE — Telephone Encounter (Addendum)
I called patient to discuss.  No answer, left a voicemail asking her to call us back.  If patient calls back another day please route to POD 2 calls.

## 2022-06-13 NOTE — Telephone Encounter (Signed)
[facility] does not carry Percocet 2.5 mg. The are asking if Dr. Laban Emperor will prescribe 5 mg, with instructions to take half tablet. Dr. Laban Emperor agrees to do that, patient called and given new instructions.

## 2022-06-13 NOTE — Telephone Encounter (Signed)
Pharmacy stated that they can fill the prescription. Pharmacy can change the dosage to half. Please give pharmacy a call. Thanks

## 2022-06-13 NOTE — Progress Notes (Signed)
PROVIDER NOTE: Interpretation of information contained herein should be left to medically-trained personnel. Specific patient instructions are provided elsewhere under "Patient Instructions" section of medical record. This document was created in part using STT-dictation technology, any transcriptional errors that may result from this process are unintentional.  Patient: Carmen [name] Type: Established DOB: Nov 01, 1940 MRN: [id number] PCP: Mort Sawyers, FNP  Service: Procedure DOS: 06/13/2022 Setting: Ambulatory Location: Ambulatory outpatient facility Delivery: Face-to-face Provider: Oswaldo Done, MD Specialty: Interventional Pain Management Specialty designation: 09 Location: Outpatient facility Ref. Prov.: Delano Metz, MD    Procedure:           Type: Lumbar Facet, Medial Branch Radiofrequency Ablation (RFA) #1  Laterality: Right (-RT)  Level: L2, L3, L4, L5, & S1 Medial Branch Level(s). These levels will denervate the L3-4, L4-5 and L5-S1 lumbar facet joints.  Imaging: Fluoroscopy-guided         Anesthesia: Local anesthesia (1-2% Lidocaine) Anxiolysis: IV Versed 1.0 mg Sedation: Moderate Sedation Fentanyl 1 mL (50 mcg) DOS: 06/13/2022  Performed by: Oswaldo Done, MD  Purpose: Therapeutic/Palliative Indications: Low back pain severe enough to impact quality of life or function. Indications: 1. Lumbar facet syndrome (Bilateral)   2. Spondylosis without myelopathy or radiculopathy, lumbosacral region   3. Lumbosacral facet hypertrophy (Multilevel) (Bilateral)   4. Lumbar facet arthropathy (Multilevel) (Bilateral)   5. DDD (degenerative disc disease), lumbosacral   6. 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 Degenerative lateral spondylolisthesis of L4/L5 (81mm)    Chronic anticoagulation (Eliquis)    Obesity, Class II, BMI 35-39.9    Ms. Bumpus has been dealing with the above chronic pain for longer than three months and has  either failed to respond, was unable to tolerate, or simply did not get enough benefit from other more conservative therapies including, but not limited to: 1. Over-the-counter medications 2. Anti-inflammatory medications 3. Muscle relaxants 4. Membrane stabilizers 5. Opioids 6. Physical therapy and/or chiropractic manipulation 7. Modalities (Heat, ice, etc.) 8. Invasive techniques such as nerve blocks. Ms. Edgin has attained more than 50% relief of the pain from a series of diagnostic injections conducted in separate occasions.  Pain Score: Pre-procedure: 8 /10 Post-procedure: 0-No pain/10     Position / Prep / Materials:  Position: Prone  Prep solution: DuraPrep (Iodine Povacrylex [0.7% available iodine] and Isopropyl Alcohol, 74% w/w) Prep Area: Entire Lumbosacral Region (Lower back from mid-thoracic region to end of tailbone and from flank to flank.) Materials:  Tray: RFA (Radiofrequency) tray Needle(s):  Type: RFA (Teflon-coated radiofrequency ablation needles) Gauge (G): 20  Length: Long (15cm) Qty: 5  Pre-op H&P Assessment:  Ms. Doyel is a 81 y.o. (year old), female patient, seen today for interventional treatment. She  has a past surgical history that includes Cardiac catheterization; Coronary angioplasty; Coronary artery bypass graft; back injections; and Cataract extraction. Ms. Pitcher has a current medication list which includes the following prescription(s): apixaban, aspirin ec, atorvastatin, calcium carbonate, cetirizine, cholecalciferol, divalproex, hydrochlorothiazide, hydrocodone-acetaminophen, [START ON 06/29/2022] hydrocodone-acetaminophen, levothyroxine, metoprolol succinate, oxycodone-acetaminophen, [START ON 06/20/2022] oxycodone-acetaminophen, trazodone, and hydrocodone-acetaminophen, and the following Facility-Administered Medications: fentanyl and pentafluoroprop-tetrafluoroeth. Her primarily concern today is the Back Pain (lower)  Initial Vital Signs:   Pulse/HCG Rate: 96ECG Heart Rate: 87 (IRREGULAR. hx afib) Temp:  (!) 97.2 F (36.2 C) Resp: 16 BP:  (!) 150/95 SpO2: 99 %  BMI: Estimated body mass index is 35.33 kg/m as calculated from the following:   Height as of this encounter:   (1.549 m).   Weight as of this encounter: 187 lb (84.8 kg).  Risk Assessment: Allergies: Reviewed. She has No Known Allergies.  Allergy Precautions: None required Coagulopathies: Reviewed. None identified.  Blood-thinner therapy: None at this time Active Infection(s): Reviewed. None identified. Ms. Sublette is afebrile  Site Confirmation: Ms. Wands was asked to confirm the procedure and laterality before marking the site Procedure checklist: Completed Consent: Before the procedure and under the influence of no sedative(s), amnesic(s), or anxiolytics, the patient was informed of the treatment options, risks and possible complications. To fulfill our ethical and legal obligations, as recommended by the American Medical Association's Code of Ethics, I have informed the patient of my clinical impression; the nature and purpose of the treatment or procedure; the risks, benefits, and possible complications of the intervention; the alternatives, including doing nothing; the risk(s) and benefit(s) of the alternative treatment(s) or procedure(s); and the risk(s) and benefit(s) of doing nothing. The patient was provided information about the general risks and possible complications associated with the procedure. These may include, but are not limited to: failure to achieve desired goals, infection, bleeding, organ or nerve damage, allergic reactions, paralysis, and death. In addition, the patient was informed of those risks and complications associated to Spine-related procedures, such as failure to decrease pain; infection (i.e.: Meningitis, epidural or intraspinal abscess); bleeding (i.e.: epidural hematoma, subarachnoid hemorrhage, or any other type of intraspinal  or peri-dural bleeding); organ or nerve damage (i.e.: Any type of peripheral nerve, nerve root, or spinal cord injury) with subsequent damage to sensory, motor, and/or autonomic systems, resulting in permanent pain, numbness, and/or weakness of one or several areas of the body; allergic reactions; (i.e.: anaphylactic reaction); and/or death. Furthermore, the patient was informed of those risks and complications associated with the medications. These include, but are not limited to: allergic reactions (i.e.: anaphylactic or anaphylactoid reaction(s)); adrenal axis suppression; blood sugar elevation that in diabetics may result in ketoacidosis or comma; water retention that in patients with history of congestive heart failure may result in shortness of breath, pulmonary edema, and decompensation with resultant heart failure; weight gain; swelling or edema; medication-induced neural toxicity; particulate matter embolism and blood vessel occlusion with resultant organ, and/or nervous system infarction; and/or aseptic necrosis of one or more joints. Finally, the patient was informed that Medicine is not an exact science; therefore, there is also the possibility of unforeseen or unpredictable risks and/or possible complications that may result in a catastrophic outcome. The patient indicated having understood very clearly. We have given the patient no guarantees and we have made no promises. Enough time was given to the patient to ask questions, all of which were answered to the patient's satisfaction. Ms. Rion has indicated that she wanted to continue with the procedure. Attestation: I, the ordering provider, attest that I have discussed with the patient the benefits, risks, side-effects, alternatives, likelihood of achieving goals, and potential problems during recovery for the procedure that I have provided informed consent. Date  Time: 06/13/2022  7:53 AM  Pre-Procedure Preparation:  Monitoring: As per clinic  protocol. Respiration, ETCO2, SpO2, BP, heart rate and rhythm monitor placed and checked for adequate function Safety Precautions: Patient was assessed for positional comfort and pressure points before starting the procedure. Time-out: I initiated and conducted the "Time-out" before starting the procedure, as per protocol. The patient was asked to participate by confirming the accuracy of the "Time Out" information. Verification of the correct person, site, and procedure were performed and confirmed  by me, the nursing staff, and the patient. "Time-out" conducted as per Joint Commission's Universal Protocol (UP.01.01.01). Time: 0824  Description of Procedure:          Laterality: Right Levels:  L2, L3, L4, L5, & S1 Medial Branch Level(s). Safety Precautions: Aspiration looking for blood return was conducted prior to all injections. At no point did we inject any substances, as a needle was being advanced. Before injecting, the patient was told to immediately notify me if she was experiencing any new onset of "ringing in the ears, or metallic taste in the mouth". No attempts were made at seeking any paresthesias. Safe injection practices and needle disposal techniques used. Medications properly checked for expiration dates. SDV (single dose vial) medications used. After the completion of the procedure, all disposable equipment used was discarded in the proper designated medical waste containers. Local Anesthesia: Protocol guidelines were followed. The patient was positioned over the fluoroscopy table. The area was prepped in the usual manner. The time-out was completed. The target area was identified using fluoroscopy. A 12-in long, straight, sterile hemostat was used with fluoroscopic guidance to locate the targets for each level blocked. Once located, the skin was marked with an approved surgical skin marker. Once all sites were marked, the skin (epidermis, dermis, and hypodermis), as well as deeper tissues  (fat, connective tissue and muscle) were infiltrated with a small amount of a short-acting local anesthetic, loaded on a 10cc syringe with a 25G, 1.5-in  Needle. An appropriate amount of time was allowed for local anesthetics to take effect before proceeding to the next step. Technical description of process:  Radiofrequency Ablation (RFA) L2 Medial Branch Nerve RFA: The target area for the L2 medial branch is at the junction of the postero-lateral aspect of the superior articular process and the superior, posterior, and medial edge of the transverse process of L3. Under fluoroscopic guidance, a Radiofrequency needle was inserted until contact was made with os over the superior postero-lateral aspect of the pedicular shadow (target area). Sensory and motor testing was conducted to properly adjust the position of the needle. Once satisfactory placement of the needle was achieved, the numbing solution was slowly injected after negative aspiration for blood. 2.0 mL of the nerve block solution was injected without difficulty or complication. After waiting for at least 3 minutes, the ablation was performed. Once completed, the needle was removed intact. L3 Medial Branch Nerve RFA: The target area for the L3 medial branch is at the junction of the postero-lateral aspect of the superior articular process and the superior, posterior, and medial edge of the transverse process of L4. Under fluoroscopic guidance, a Radiofrequency needle was inserted until contact was made with os over the superior postero-lateral aspect of the pedicular shadow (target area). Sensory and motor testing was conducted to properly adjust the position of the needle. Once satisfactory placement of the needle was achieved, the numbing solution was slowly injected after negative aspiration for blood. 2.0 mL of the nerve block solution was injected without difficulty or complication. After waiting for at least 3 minutes, the ablation was performed.  Once completed, the needle was removed intact. L4 Medial Branch Nerve RFA: The target area for the L4 medial branch is at the junction of the postero-lateral aspect of the superior articular process and the superior, posterior, and medial edge of the transverse process of L5. Under fluoroscopic guidance, a Radiofrequency needle was inserted until contact was made with os over the superior postero-lateral aspect of the pedicular  shadow (target area). Sensory and motor testing was conducted to properly adjust the position of the needle. Once satisfactory placement of the needle was achieved, the numbing solution was slowly injected after negative aspiration for blood. 2.0 mL of the nerve block solution was injected without difficulty or complication. After waiting for at least 3 minutes, the ablation was performed. Once completed, the needle was removed intact. L5 Medial Branch Nerve RFA: The target area for the L5 medial branch is at the junction of the postero-lateral aspect of the superior articular process of S1 and the superior, posterior, and medial edge of the sacral ala. Under fluoroscopic guidance, a Radiofrequency needle was inserted until contact was made with os over the superior postero-lateral aspect of the pedicular shadow (target area). Sensory and motor testing was conducted to properly adjust the position of the needle. Once satisfactory placement of the needle was achieved, the numbing solution was slowly injected after negative aspiration for blood. 2.0 mL of the nerve block solution was injected without difficulty or complication. After waiting for at least 3 minutes, the ablation was performed. Once completed, the needle was removed intact. S1 Medial Branch Nerve RFA: The target area for the S1 medial branch is located inferior to the junction of the S1 superior articular process and the L5 inferior articular process, posterior, inferior, and lateral to the 6 o'clock position of the L5-S1  facet joint, just superior to the S1 posterior foramen. Under fluoroscopic guidance, the Radiofrequency needle was advanced until contact was made with os over the Target area. Sensory and motor testing was conducted to properly adjust the position of the needle. Once satisfactory placement of the needle was achieved, the numbing solution was slowly injected after negative aspiration for blood. 2.0 mL of the nerve block solution was injected without difficulty or complication. After waiting for at least 3 minutes, the ablation was performed. Once completed, the needle was removed intact. Radiofrequency lesioning (ablation):  Radiofrequency Generator: Medtronic AccurianTM AG 1000 RF Generator Sensory Stimulation Parameters: 50 Hz was used to locate & identify the nerve, making sure that the needle was positioned such that there was no sensory stimulation below 0.3 V or above 0.7 V. Motor Stimulation Parameters: 2 Hz was used to evaluate the motor component. Care was taken not to lesion any nerves that demonstrated motor stimulation of the lower extremities at an output of less than 2.5 times that of the sensory threshold, or a maximum of 2.0 V. Lesioning Technique Parameters: Standard Radiofrequency settings. (Not bipolar or pulsed.) Temperature Settings: 80 degrees C Lesioning time: 60 seconds Intra-operative Compliance: Compliant  Once the entire procedure was completed, the treated area was cleaned, making sure to leave some of the prepping solution back to take advantage of its long term bactericidal properties.    Illustration of the posterior view of the lumbar spine and the posterior neural structures. Laminae of L2 through S1 are labeled. DPRL5, dorsal primary ramus of L5; DPRS1, dorsal primary ramus of S1; DPR3, dorsal primary ramus of L3; FJ, facet (zygapophyseal) joint L3-L4; I, inferior articular process of L4; LB1, lateral branch of dorsal primary ramus of L1; IAB, inferior articular branches  from L3 medial branch (supplies L4-L5 facet joint); IBP, intermediate branch plexus; MB3, medial branch of dorsal primary ramus of L3; NR3, third lumbar nerve root; S, superior articular process of L5; SAB, superior articular branches from L4 (supplies L4-5 facet joint also); TP3, transverse process of L3.  Vitals:   06/13/22 0910 06/13/22 0920 06/13/22  1610 06/13/22 0937  BP: (!) 158/101 (!) 184/97 (!) 155/101 (!) 150/90  Pulse:      Resp: Temp:      SpO2: 100% 98% 98%   Weight:      Height:        Start Time: 0824 hrs. End Time: 0900 hrs.  Imaging Guidance (Spinal):          Type of Imaging Technique: Fluoroscopy Guidance (Spinal) Indication(s): Assistance in needle guidance and placement for procedures requiring needle placement in or near specific anatomical locations not easily accessible without such assistance. Exposure Time: Please see nurses notes. Contrast: None used. Fluoroscopic Guidance: I was personally present during the use of fluoroscopy. "Tunnel Vision Technique" used to obtain the best possible view of the target area. Parallax error corrected before commencing the procedure. "Direction-depth-direction" technique used to introduce the needle under continuous pulsed fluoroscopy. Once target was reached, antero-posterior, oblique, and lateral fluoroscopic projection used confirm needle placement in all planes. Images permanently stored in EMR. Interpretation: No contrast injected. I personally interpreted the imaging intraoperatively. Adequate needle placement confirmed in multiple planes. Permanent images saved into the patient's record.  Antibiotic Prophylaxis:   Anti-infectives (From admission, onward)    None      Indication(s): None identified  Post-operative Assessment:  Post-procedure Vital Signs:  Pulse/HCG Rate: 9685 Temp:  (!) 97.2 F (36.2 C) Resp: 19 BP:  (!) 150/90 SpO2: 98 %  EBL: None  Complications: No immediate post-treatment  complications observed by team, or reported by patient.  Note: The patient tolerated the entire procedure well. A repeat set of vitals were taken after the procedure and the patient was kept under observation following institutional policy, for this type of procedure. Post-procedural neurological assessment was performed, showing return to baseline, prior to discharge. The patient was provided with post-procedure discharge instructions, including a section on how to identify potential problems. Should any problems arise concerning this procedure, the patient was given instructions to immediately contact us, at any time, without hesitation. In any case, we plan to contact the patient by telephone for a follow-up status report regarding this interventional procedure.  Comments:  No additional relevant information.  Plan of Care  Orders:  Orders Placed This Encounter  Procedures   Radiofrequency,Lumbar    Scheduling Instructions:     Side(s): Right-sided     Level: L3-4, L4-5, & L5-S1 Facets (L2, L3, L4, L5, & S1 Medial Branch Nerves)     Sedation: With Sedation.     Timeframe: Today    Order Specific Question:   Where will this procedure be performed?    Answer:   [facility]   DG [facility] C-ARM 1-60 MIN NO REPORT    Intraoperative interpretation by procedural physician at [facility].    Standing Status:   Standing    Number of Occurrences:   1    Order Specific Question:   Reason for exam:    Answer:   Assistance in needle guidance and placement for procedures requiring needle placement in or near specific anatomical locations not easily accessible without such assistance.   Informed Consent Details: Physician/Practitioner Attestation; Transcribe to consent form and obtain patient signature    Nursing Order: Transcribe to consent form and obtain patient signature. Note: Always confirm laterality of pain with Ms. Beller, before procedure.    Order Specific Question:    Physician/Practitioner attestation of informed consent for procedure/surgical case    Answer:  I, the physician/practitioner, attest that I have discussed with the patient the benefits, risks, side effects, alternatives, likelihood of achieving goals and potential problems during recovery for the procedure that I have provided informed consent.    Order Specific Question:   Procedure    Answer:   Lumbar Facet Radiofrequency Ablation    Order Specific Question:   Physician/Practitioner performing the procedure    Answer:   Eyanna Mcgonagle A. Laban EmperorNaveira, MD    Order Specific Question:   Indication/Reason    Answer:   Low Back Pain, with our without leg pain, due to Facet Joint Arthralgia (Joint Pain) known as Lumbar Facet Syndrome, secondary to Lumbar, and/or Lumbosacral Spondylosis (Arthritis of the Spine), without myelopathy or radiculopathy (Nerve Damage).   Care order/instruction: Please confirm that the patient has stopped the Eliquis (Apixaban) x 3 days prior to procedure or surgery.    Please confirm that the patient has stopped the Eliquis (Apixaban) x 3 days prior to procedure or surgery.    Standing Status:   Standing    Number of Occurrences:   1   Provide equipment / supplies at bedside    "Radiofrequency Tray"; Large hemostat (x1); Small hemostat (x1); Towels (x8); 4x4 sterile sponge pack (x1) Needle type: Teflon-coated Radiofrequency Needle (Disposable  single use) Size: Long Quantity: 5    Standing Status:   Standing    Number of Occurrences:   1    Order Specific Question:   Specify    Answer:   Radiofrequency Tray   Bleeding precautions    Standing Status:   Standing    Number of Occurrences:   1   Chronic Opioid Analgesic:  Hydrocodone/APAP 5/325, 1 tab p.o. BID (10 mg/day of hydrocodone) (10 MME) (last filled on 05/02/2021) MME/day: 10 mg/day   Medications ordered for procedure: Meds ordered this encounter  Medications   lidocaine (XYLOCAINE) 2 % (with pres) injection 400 mg    pentafluoroprop-tetrafluoroeth (GEBAUERS) aerosol   lactated ringers infusion   midazolam (VERSED) 5 MG/5ML injection 0.5-2 mg    Make sure Flumazenil is available in the pyxis when using this medication. If oversedation occurs, administer 0.2 mg IV over 15 sec. If after 45 sec no response, administer 0.2 mg again over 1 min; may repeat at 1 min intervals; not to exceed 4 doses (1 mg)   fentaNYL (SUBLIMAZE) injection 25-50 mcg    Make sure Narcan is available in the pyxis when using this medication. In the event of respiratory depression (RR< 8/min): Titrate NARCAN (naloxone) in increments of 0.1 to 0.2 mg IV at 2-3 minute intervals, until desired degree of reversal.   ropivacaine (PF) 2 mg/mL (0.2%) (NAROPIN) injection 9 mL   triamcinolone acetonide (KENALOG-40) injection 40 mg   DISCONTD: oxycodone-acetaminophen (PERCOCET) 2.5-325 MG tablet    Sig: Take 1 tablet by mouth every 8 (eight) hours as needed for up to 7 days for pain. Must last 7 days.    Dispense:  21 tablet    Refill:  0    For acute post-operative pain. Not to be refilled.  Must last 7 days.   DISCONTD: oxycodone-acetaminophen (PERCOCET) 2.5-325 MG tablet    Sig: Take 1 tablet by mouth every 8 (eight) hours as needed for up to 7 days for pain. Must last 7 days.    Dispense:  21 tablet    Refill:  0    For acute post-operative pain. Not to be refilled.  Must last 7 days.   oxyCODONE-acetaminophen (PERCOCET) 5-325  MG tablet    Sig: Take 0.5 tablets by mouth every 8 (eight) hours as needed for up to 7 days for severe pain. Must last 7 days.    Dispense:  11 tablet    Refill:  0    For acute post-operative pain. Not to be refilled.  Must last 7 days.   oxyCODONE-acetaminophen (PERCOCET) 5-325 MG tablet    Sig: Take 0.5 tablets by mouth every 8 (eight) hours as needed for up to 7 days for severe pain. Must last 7 days.    Dispense:  11 tablet    Refill:  0    For acute post-operative pain. Not to be refilled.  Must last 7 days.    Medications administered: We administered lidocaine, lactated ringers, midazolam, fentaNYL, ropivacaine (PF) 2 mg/mL (0.2%), and triamcinolone acetonide.  See the medical record for exact dosing, route, and time of administration.  Follow-up plan:   Return in about 2 weeks (around 06/27/2022) for ( ), procedure (ECT): (L) L-FCT RFA #1 + (PPE).       Interventional Therapies  Risk  Complexity Considerations:   Estimated body mass index is 34.2 kg/m as calculated from the following:   Height as of this encounter: 5\' 1"  (1.549 m).   Weight as of this encounter: 181 lb (82.1 kg). ELIQUIS + ASA Anticoagulation (Stop: 3 days  Restart: 6 hours)   Planned  Pending:   Diagnostic left lumbar facet RFA #1    Under consideration:   Diagnostic/therapeutic cervical ESI  Therapeutic bilateral lumbar facet RFA #1  Diagnostic/therapeutic right L4-5 LESI #1  Diagnostic/therapeutic right L4 and L5 TFESI #1    Completed:   Therapeutic right lumbar facet RFA x1 (06/13/2022)  Diagnostic bilateral lumbar facet MBB x3 (01/26/2022) (100/100/90/90)  Diagnostic left cervical facet MBB x1 (06/28/2021) (25/25/0/0)    Therapeutic  Palliative (PRN) options:   Therapeutic lumbar facet MBB      Recent Visits Date Type Provider Dept  04/17/22 Office Visit 04/19/22, MD [facility]  Showing recent visits within past 90 days and meeting all other requirements Today's Visits Date Type Provider Dept  06/13/22 Procedure visit 08/13/22, MD [facility]  Showing today's visits and meeting all other requirements Future Appointments Date Type Provider Dept  07/17/22 Appointment 07/19/22, MD [facility]  Showing future appointments within next 90 days and meeting all other requirements  Disposition: Discharge home  Discharge (Date  Time): 06/13/2022; 0940 hrs.   Primary Care Physician: 08/13/2022, FNP Location: [facility] Note by: [facility], MD Date: 06/13/2022; Time: 10:09 AM  Disclaimer:  Medicine is not an 08/13/2022. The only guarantee in medicine is that nothing is guaranteed. It is important to note that the decision to proceed with this intervention was based on the information collected from the patient. The Data and conclusions were drawn from the patient's questionnaire, the interview, and the physical examination. Because the information was provided in large part by the patient, it cannot be guaranteed that it has not been purposely or unconsciously manipulated. Every effort has been made to obtain as much relevant data as possible for this evaluation. It is important to note that the conclusions that lead to this procedure are derived in large part from the available data. Always take into account that the treatment will also be dependent on availability of resources and existing treatment guidelines, considered by other Pain Management Practitioners as being common knowledge and practice,  at the time of the intervention. For Medico-Legal purposes, it is also important to point out that variation in procedural techniques and pharmacological choices are the acceptable norm. The indications, contraindications, technique, and results of the above procedure should only be interpreted and judged by a Board-Certified Interventional Pain Specialist with extensive familiarity and expertise in the same exact procedure and technique.

## 2022-06-13 NOTE — Patient Instructions (Addendum)
___________________________________________________________________________________________  Post-Radiofrequency (RF) Discharge Instructions  You have just completed a Radiofrequency Neurotomy.  The following instructions will provide you with information and guidelines for self-care upon discharge.  If at any time you have questions or concerns please call your physician. DO NOT DRIVE YOURSELF!!  Instructions: Apply ice: Fill a plastic sandwich bag with crushed ice. Cover it with a small towel and apply to injection site. Apply for 15 minutes then remove x 15 minutes. Repeat sequence on day of procedure, until you go to bed. The purpose is to minimize swelling and discomfort after procedure. Apply heat: Apply heat to procedure site starting the day following the procedure. The purpose is to treat any soreness and discomfort from the procedure. Food intake: No eating limitations, unless stipulated above.  Nevertheless, if you have had sedation, you may experience some nausea.  In this case, it may be wise to wait at least two hours prior to resuming regular diet. Physical activities: Keep activities to a minimum for the first 8 hours after the procedure. For the first 24 hours after the procedure, do not drive a motor vehicle,  Operate heavy machinery, power tools, or handle any weapons.  Consider walking with the use of an assistive device or accompanied by an adult for the first 24 hours.  Do not drink alcoholic beverages including beer.  Do not make any important decisions or sign any legal documents. Go home and rest today.  Resume activities tomorrow, as tolerated.  Use caution in moving about as you may experience mild leg weakness.  Use caution in cooking, use of household electrical appliances and climbing steps. Driving: If you have received any sedation, you are not allowed to drive for 24 hours after your procedure. Blood thinner: Restart your blood thinner 6 hours after your procedure. (Only  for those taking blood thinners) Insulin: As soon as you can eat, you may resume your normal dosing schedule. (Only for those taking insulin) Medications: May resume pre-procedure medications.  Do not take any drugs, other than what has been prescribed to you. Infection prevention: Keep procedure site clean and dry. Post-procedure Pain Diary: Extremely important that this be done correctly and accurately. Recorded information will be used to determine the next step in treatment. Pain evaluated is that of treated area only. Do not include pain from an untreated area. Complete every hour, on the hour, for the initial 8 hours. Set an alarm to help you do this part accurately. Do not go to sleep and have it completed later. It will not be accurate. Follow-up appointment: Keep your follow-up appointment after the procedure. Usually 2-6 weeks after radiofrequency. Bring you pain diary. The information collected will be essential for your long-term care.   Expect: From numbing medicine (AKA: Local Anesthetics): Numbness or decrease in pain. Onset: Full effect within 15 minutes of injected. Duration: It will depend on the type of local anesthetic used. On the average, 1 to 8 hours.  From steroids (when added): Decrease in swelling or inflammation. Once inflammation is improved, relief of the pain will follow. Onset of benefits: Depends on the amount of swelling present. The more swelling, the longer it will take for the benefits to be seen. In some cases, up to 10 days. Duration: Steroids will stay in the system x 2 weeks. Duration of benefits will depend on multiple posibilities including persistent irritating factors. From procedure: Some discomfort is to be expected once the numbing medicine wears off. In the case of radiofrequency procedures,  this may last as long as 6 weeks. Additional post-procedure pain medication is provided for this. Discomfort is minimized if ice and heat are applied as  instructed.  Call if: You experience numbness and weakness that gets worse with time, as opposed to wearing off. He experience any unusual bleeding, difficulty breathing, or loss of the ability to control your bowel and bladder. (This applies to Spinal procedures only) You experience any redness, swelling, heat, red streaks, elevated temperature, fever, or any other signs of a possible infection.  Emergency Numbers: [place] hours (Monday - Thursday, 8:00 AM - 4:00 PM) (Friday, 9:00 AM - 12:00 Noon): [phone] After hours: [phone] ____________________________________________________________________________________________   ______________________________________________________________________________________________  Body mass index (BMI)  Body mass index (BMI) is a common tool for deciding whether a person has an appropriate body weight.  It measures a persons weight in relation to their height.   According to the [facility] of health (NIH): A BMI of less than 18.5 means that a person is underweight. A BMI of between 18.5 and 24.9 is ideal. A BMI of between 25 and 29.9 is overweight. A BMI over 30 indicates obesity.  Weight Management Required  URGENT: Your weight has been found to be adversely affecting your health.  Dear Carmen [name]:  Your current Estimated body mass index is 35.33 kg/m as calculated from the following:   Height as of 06/08/22: 5' 1"  (1.549 m).   Weight as of 06/08/22: 187 lb (84.8 kg).  Please use the table below to identify your weight category and associated incidence of chronic pain, secondary to your weight.  Body Mass Index (BMI) Classification BMI level (kg/m2) Category Associated incidence of chronic pain  <18  Underweight   18.5-24.9 Ideal body weight   25-29.9 Overweight  20%  30-34.9 Obese (Class I)  68%  35-39.9 Severe obesity (Class II)  136%  >40 Extreme obesity (Class III)  254%   In addition: You will be  considered "Morbidly Obese", if your BMI is above 30 and you have one or more of the following conditions which are known to be caused and/or directly associated with obesity: 1.    Type 2 Diabetes (Which in turn can lead to cardiovascular diseases (CVD), stroke, peripheral vascular diseases (PVD), retinopathy, nephropathy, and neuropathy) 2.    Cardiovascular Disease (High Blood Pressure; Congestive Heart Failure; High Cholesterol; Coronary Artery Disease; Angina; or History of Heart Attacks) 3.    Breathing problems (Asthma; obesity-hypoventilation syndrome; obstructive sleep apnea; chronic inflammatory airway disease; reactive airway disease; or shortness of breath) 4.    Chronic kidney disease 5.    Liver disease (nonalcoholic fatty liver disease) 6.    High blood pressure 7.    Acid reflux (gastroesophageal reflux disease; heartburn) 8.    Osteoarthritis (OA) (with any of the following: hip pain; knee pain; and/or low back pain) 9.    Low back pain (Lumbar Facet Syndrome; and/or Degenerative Disc Disease) 10.  Hip pain (Osteoarthritis of hip) (For every 1 lbs of added body weight, there is a 2 lbs increase in pressure inside of each hip articulation. 1:2 mechanical relationship) 11.  Knee pain (Osteoarthritis of knee) (For every 1 lbs of added body weight, there is a 4 lbs increase in pressure inside of each knee articulation. 1:4 mechanical relationship) (patients with a BMI>30 kg/m2 were 6.8 times more likely to develop knee OA than normal-weight individuals) 12.  Cancer: Epidemiological studies have shown that obesity is a risk factor for:  post-menopausal breast cancer; cancers of the endometrium, colon and kidney cancer; malignant adenomas of the oesophagus. Obese subjects have an approximately 1.5-3.5-fold increased risk of developing these cancers compared with normal-weight subjects, and it has been estimated that between 15 and 45% of these cancers can be attributed to overweight. More  recent studies suggest that obesity may also increase the risk of other types of cancer, including pancreatic, hepatic and gallbladder cancer. (Ref: Obesity and cancer. Pischon T, Nthlings U, [employer] H. Proc Nutr Soc. 2008 May;67(2):128-45. doi: 41.7408/[id number].) The International Agency for Research on Cancer (IARC) has identified 13 cancers associated with overweight and obesity: meningioma, multiple myeloma, adenocarcinoma of the esophagus, and cancers of the thyroid, postmenopausal breast cancer, gallbladder, stomach, liver, pancreas, kidney, ovaries, uterus, colon and rectal (colorectal) cancers. 20 percent of all cancers diagnosed in women and 24 percent of those diagnosed in men are associated with overweight and obesity.  Recommendation: At this point it is urgent that you take a step back and concentrate in loosing weight. Dedicate 100% of your efforts on this task. Nothing else will improve your health more than bringing your weight down and your BMI to less than 30. If you are here, you probably have chronic pain. We know that most chronic pain patients have difficulty exercising secondary to their pain. For this reason, you must rely on proper nutrition and diet in order to lose the weight. If your BMI is above 40, you should seriously consider bariatric surgery. A realistic goal is to lose 10% of your body weight over a period of 12 months.  Be honest to yourself, if over time you have unsuccessfully tried to lose weight, then it is time for you to seek professional help and to enter a medically supervised weight management program, and/or undergo bariatric surgery. Stop procrastinating.   Pain management considerations:  1.    Pharmacological Problems: Be advised that the use of opioid analgesics (oxycodone; hydrocodone; morphine; methadone; codeine; and all of their derivatives) have been associated with decreased metabolism and weight gain.  For this reason, should we see that you are  unable to lose weight while taking these medications, it may become necessary for [place] to taper down and indefinitely discontinue them.  2.    Technical Problems: The incidence of successful interventional therapies decreases as the patient's BMI increases. It is much more difficult to accomplish a safe and effective interventional therapy on a patient with a BMI above 35. 3.    Radiation Exposure Problems: The x-rays machine, used to accomplish injection therapies, will automatically increase their x-ray output in order to capture an appropriate bone image. This means that radiation exposure increases exponentially with the patient's BMI. (The higher the BMI, the higher the radiation exposure.) Although the level of radiation used at a given time is still safe to the patient, it is not for the physician and/or assisting staff. Unfortunately, radiation exposure is accumulative. Because physicians and the staff have to do procedures and be exposed on a daily basis, this can result in health problems such as cancer and radiation burns. Radiation exposure to the staff is monitored by the radiation batches that they wear. The exposure levels are reported back to the staff on a quarterly basis. Depending on levels of exposure, physicians and staff may be obligated by law to decrease this exposure. This means that they have the right and obligation to refuse providing therapies where they may be overexposed to radiation. For this reason, physicians may decline to  offer therapies such as radiofrequency ablation or implants to patients with a BMI above 40. 4.    Current Trends: Be advised that the current trend is to no longer offer certain therapies to patients with a BMI equal to, or above 35, due to increase perioperative risks, increased technical procedural difficulties, and excessive radiation exposure to healthcare  personnel.  ______________________________________________________________________________________________   ______________________________________________________________________  Preparing for Procedure with Sedation  NOTICE: Due to recent regulatory changes, starting on May 02, 2021, procedures requiring intravenous (IV) sedation will no longer be performed at the [place].  These types of procedures are required to be performed at [facility] ambulatory surgery facility.  We are very sorry for the inconvenience.  Procedure appointments are limited to planned procedures: No Prescription Refills. No disability issues will be discussed. No medication changes will be discussed.  Instructions: Oral Intake: Do not eat or drink anything for at least 8 hours prior to your procedure. (Exception: Blood Pressure Medication. See below.) Transportation: A driver is required. You may not drive yourself after the procedure. Blood Pressure Medicine: Do not forget to take your blood pressure medicine with a sip of water the morning of the procedure. If your Diastolic (lower reading) is above 100 mmHg, elective cases will be cancelled/rescheduled. Blood thinners: These will need to be stopped for procedures. Notify our staff if you are taking any blood thinners. Depending on which one you take, there will be specific instructions on how and when to stop it. Diabetics on insulin: Notify the staff so that you can be scheduled 1st case in the morning. If your diabetes requires high dose insulin, take only  of your normal insulin dose the morning of the procedure and notify the staff that you have done so. Preventing infections: Shower with an antibacterial soap the morning of your procedure. Build-up your immune system: Take 1000 mg of Vitamin C with every meal (3 times a day) the day prior to your procedure. Antibiotics: Inform the staff if you have a condition or reason that requires you to take  antibiotics before dental procedures. Pregnancy: If you are pregnant, call and cancel the procedure. Sickness: If you have a cold, fever, or any active infections, call and cancel the procedure. Arrival: You must be in the facility at least 30 minutes prior to your scheduled procedure. Children: Do not bring children with you. Dress appropriately: There is always the possibility that your clothing may get soiled. Valuables: Do not bring any jewelry or valuables.  Reasons to call and reschedule or cancel your procedure: (Following these recommendations will minimize the risk of a serious complication.) Surgeries: Avoid having procedures within 2 weeks of any surgery. (Avoid for 2 weeks before or after any surgery). Flu Shots: Avoid having procedures within 2 weeks of a flu shots. (Avoid for 2 weeks before or after immunizations). Barium: Avoid having a procedure within 7-10 days after having had a radiological study involving the use of radiological contrast. (Myelograms, Barium swallow or enema study). Heart attacks: Avoid any elective procedures or surgeries for the initial 6 months after a "Myocardial Infarction" (Heart Attack). Blood thinners: It is imperative that you stop these medications before procedures. Let us know if you if you take any blood thinner.  Infection: Avoid procedures during or within two weeks of an infection (including chest colds or gastrointestinal problems). Symptoms associated with infections include: Localized redness, fever, chills, night sweats or profuse sweating, burning sensation when voiding, cough, congestion, stuffiness, runny nose, sore throat,  diarrhea, nausea, vomiting, cold or Flu symptoms, recent or current infections. It is specially important if the infection is over the area that we intend to treat. Heart and lung problems: Symptoms that may suggest an active cardiopulmonary problem include: cough, chest pain, breathing difficulties or shortness of breath,  dizziness, ankle swelling, uncontrolled high or unusually low blood pressure, and/or palpitations. If you are experiencing any of these symptoms, cancel your procedure and contact your primary care physician for an evaluation.  Remember:  Regular Business hours are:  Monday to Thursday 8:00 AM to 4:00 PM  Provider's Schedule: Milinda Pointer, MD:  Procedure days: Tuesday and Thursday 7:30 AM to 4:00 PM  Gillis Santa, MD:  Procedure days: Monday and Wednesday 7:30 AM to 4:00 PM ______________________________________________________________________  ____________________________________________________________________________________________  General Risks and Possible Complications  Patient Responsibilities: It is important that you read this as it is part of your informed consent. It is our duty to inform you of the risks and possible complications associated with treatments offered to you. It is your responsibility as a patient to read this and to ask questions about anything that is not clear or that you believe was not covered in this document.  Patient's Rights: You have the right to refuse treatment. You also have the right to change your mind, even after initially having agreed to have the treatment done. However, under this last option, if you wait until the last second to change your mind, you may be charged for the materials used up to that point.  Introduction: Medicine is not an Chief Strategy Officer. Everything in Medicine, including the lack of treatment(s), carries the potential for danger, harm, or loss (which is by definition: Risk). In Medicine, a complication is a secondary problem, condition, or disease that can aggravate an already existing one. All treatments carry the risk of possible complications. The fact that a side effects or complications occurs, does not imply that the treatment was conducted incorrectly. It must be clearly understood that these can happen even when  everything is done following the highest safety standards.  No treatment: You can choose not to proceed with the proposed treatment alternative. The "PRO(s)" would include: avoiding the risk of complications associated with the therapy. The "CON(s)" would include: not getting any of the treatment benefits. These benefits fall under one of three categories: diagnostic; therapeutic; and/or palliative. Diagnostic benefits include: getting information which can ultimately lead to improvement of the disease or symptom(s). Therapeutic benefits are those associated with the successful treatment of the disease. Finally, palliative benefits are those related to the decrease of the primary symptoms, without necessarily curing the condition (example: decreasing the pain from a flare-up of a chronic condition, such as incurable terminal cancer).  General Risks and Complications: These are associated to most interventional treatments. They can occur alone, or in combination. They fall under one of the following six (6) categories: no benefit or worsening of symptoms; bleeding; infection; nerve damage; allergic reactions; and/or death. No benefits or worsening of symptoms: In Medicine there are no guarantees, only probabilities. No healthcare provider can ever guarantee that a medical treatment will work, they can only state the probability that it may. Furthermore, there is always the possibility that the condition may worsen, either directly, or indirectly, as a consequence of the treatment. Bleeding: This is more common if the patient is taking a blood thinner, either prescription or over the counter (example: Goody Powders, Fish oil, Aspirin, Garlic, etc.), or if suffering a condition associated  with impaired coagulation (example: Hemophilia, cirrhosis of the liver, low platelet counts, etc.). However, even if you do not have one on these, it can still happen. If you have any of these conditions, or take one of these  drugs, make sure to notify your treating physician. Infection: This is more common in patients with a compromised immune system, either due to disease (example: diabetes, cancer, human immunodeficiency virus [HIV], etc.), or due to medications or treatments (example: therapies used to treat cancer and rheumatological diseases). However, even if you do not have one on these, it can still happen. If you have any of these conditions, or take one of these drugs, make sure to notify your treating physician. Nerve Damage: This is more common when the treatment is an invasive one, but it can also happen with the use of medications, such as those used in the treatment of cancer. The damage can occur to small secondary nerves, or to large primary ones, such as those in the spinal cord and brain. This damage may be temporary or permanent and it may lead to impairments that can range from temporary numbness to permanent paralysis and/or brain death. Allergic Reactions: Any time a substance or material comes in contact with our body, there is the possibility of an allergic reaction. These can range from a mild skin rash (contact dermatitis) to a severe systemic reaction (anaphylactic reaction), which can result in death. Death: In general, any medical intervention can result in death, most of the time due to an unforeseen complication. ____________________________________________________________________________________________ ____________________________________________________________________________________________  Blood Thinners  IMPORTANT NOTICE:  If you take any of these, make sure to notify the nursing staff.  Failure to do so may result in injury.  Recommended time intervals to stop and restart blood-thinners, before & after invasive procedures  Generic Name Brand Name Pre-procedure. Stop this long before procedure. Post-procedure. Minimum waiting period before restarting.  Abciximab Reopro 15 days 2  hrs  Alteplase Activase 10 days 10 days  Anagrelide Agrylin    Apixaban Eliquis 3 days 6 hrs  Cilostazol Pletal 3 days 5 hrs  Clopidogrel Plavix 7-10 days 2 hrs  Dabigatran Pradaxa 5 days 6 hrs  Dalteparin Fragmin 24 hours 4 hrs  Dipyridamole Aggrenox 11days 2 hrs  Edoxaban Lixiana; Savaysa 3 days 2 hrs  Enoxaparin  Lovenox 24 hours 4 hrs  Eptifibatide Integrillin 8 hours 2 hrs  Fondaparinux  Arixtra 72 hours 12 hrs  Hydroxychloroquine Plaquenil 11 days   Prasugrel Effient 7-10 days 6 hrs  Reteplase Retavase 10 days 10 days  Rivaroxaban Xarelto 3 days 6 hrs  Ticagrelor Brilinta 5-7 days 6 hrs  Ticlopidine Ticlid 10-14 days 2 hrs  Tinzaparin Innohep 24 hours 4 hrs  Tirofiban Aggrastat 8 hours 2 hrs  Warfarin Coumadin 5 days 2 hrs   Other medications with blood-thinning effects  Product indications Generic (Brand) names Note  Cholesterol Lipitor Stop 4 days before procedure  Blood thinner (injectable) Heparin (LMW or LMWH Heparin) Stop 24 hours before procedure  Cancer Ibrutinib (Imbruvica) Stop 7 days before procedure  Malaria/Rheumatoid Hydroxychloroquine (Plaquenil) Stop 11 days before procedure  Thrombolytics  10 days before or after procedures   Over-the-counter (OTC) Products with blood-thinning effects  Product Common names Stop Time  Aspirin > 325 mg Goody Powders, Excedrin, etc. 11 days  Aspirin ? 81 mg  7 days  Fish oil  4 days  Garlic supplements  7 days  Ginkgo biloba  36 hours  Ginseng  24 hours  NSAIDs  Ibuprofen, Naprosyn, etc. 3 days  Vitamin E  4 days   ____________________________________________________________________________________________ ___________________________________________________________________________________________  Post-Radiofrequency (RF) Discharge Instructions  You have just completed a Radiofrequency Neurotomy.  The following instructions will provide you with information and guidelines for self-care upon discharge.  If at any time  you have questions or concerns please call your physician. DO NOT DRIVE YOURSELF!!  Instructions: Apply ice: Fill a plastic sandwich bag with crushed ice. Cover it with a small towel and apply to injection site. Apply for 15 minutes then remove x 15 minutes. Repeat sequence on day of procedure, until you go to bed. The purpose is to minimize swelling and discomfort after procedure. Apply heat: Apply heat to procedure site starting the day following the procedure. The purpose is to treat any soreness and discomfort from the procedure. Food intake: No eating limitations, unless stipulated above.  Nevertheless, if you have had sedation, you may experience some nausea.  In this case, it may be wise to wait at least two hours prior to resuming regular diet. Physical activities: Keep activities to a minimum for the first 8 hours after the procedure. For the first 24 hours after the procedure, do not drive a motor vehicle,  Operate heavy machinery, power tools, or handle any weapons.  Consider walking with the use of an assistive device or accompanied by an adult for the first 24 hours.  Do not drink alcoholic beverages including beer.  Do not make any important decisions or sign any legal documents. Go home and rest today.  Resume activities tomorrow, as tolerated.  Use caution in moving about as you may experience mild leg weakness.  Use caution in cooking, use of household electrical appliances and climbing steps. Driving: If you have received any sedation, you are not allowed to drive for 24 hours after your procedure. Blood thinner: Restart your blood thinner 6 hours after your procedure. (Only for those taking blood thinners) Insulin: As soon as you can eat, you may resume your normal dosing schedule. (Only for those taking insulin) Medications: May resume pre-procedure medications.  Do not take any drugs, other than what has been prescribed to you. Infection prevention: Keep procedure site clean and  dry. Post-procedure Pain Diary: Extremely important that this be done correctly and accurately. Recorded information will be used to determine the next step in treatment. Pain evaluated is that of treated area only. Do not include pain from an untreated area. Complete every hour, on the hour, for the initial 8 hours. Set an alarm to help you do this part accurately. Do not go to sleep and have it completed later. It will not be accurate. Follow-up appointment: Keep your follow-up appointment after the procedure. Usually 2-6 weeks after radiofrequency. Bring you pain diary. The information collected will be essential for your long-term care.   Expect: From numbing medicine (AKA: Local Anesthetics): Numbness or decrease in pain. Onset: Full effect within 15 minutes of injected. Duration: It will depend on the type of local anesthetic used. On the average, 1 to 8 hours.  From steroids (when added): Decrease in swelling or inflammation. Once inflammation is improved, relief of the pain will follow. Onset of benefits: Depends on the amount of swelling present. The more swelling, the longer it will take for the benefits to be seen. In some cases, up to 10 days. Duration: Steroids will stay in the system x 2 weeks. Duration of benefits will depend on multiple posibilities including persistent irritating factors. From procedure: Some discomfort is  to be expected once the numbing medicine wears off. In the case of radiofrequency procedures, this may last as long as 6 weeks. Additional post-procedure pain medication is provided for this. Discomfort is minimized if ice and heat are applied as instructed.  Call if: You experience numbness and weakness that gets worse with time, as opposed to wearing off. He experience any unusual bleeding, difficulty breathing, or loss of the ability to control your bowel and bladder. (This applies to Spinal procedures only) You experience any redness, swelling, heat, red  streaks, elevated temperature, fever, or any other signs of a possible infection.  Emergency Numbers: [place] business hours (Monday - Thursday, 8:00 AM - 4:00 PM) (Friday, 9:00 AM - 12:00 Noon): [phone] After hours: [phone] ____________________________________________________________________________________________

## 2022-06-13 NOTE — Progress Notes (Signed)
Safety precautions to be maintained throughout the outpatient stay will include: orient to surroundings, keep bed in low position, maintain call bell within reach at all times, provide assistance with transfer out of bed and ambulation.  

## 2022-06-13 NOTE — Telephone Encounter (Signed)
-----   Message from Micki Riley, MD sent at 06/09/2022  8:22 AM EDT ----- Carmen [name] inform the patient that all lab results are not back yet but thyroid hormone is elevated and he needs to see his primary care physician to discuss medications for his underactive thyroid.  Vitamin B12 level and test for syphilis are okay

## 2022-06-13 NOTE — Telephone Encounter (Signed)
Pt's daughter returned my call ( ok per DPR) and we discussed results. She verbalized appreciation/understanding. PCP is managing thyroid levels/meds and has frequent f/u on this.

## 2022-06-14 ENCOUNTER — Encounter: Payer: Self-pay | Admitting: Family

## 2022-06-14 ENCOUNTER — Telehealth: Payer: Self-pay

## 2022-06-14 DIAGNOSIS — E785 Hyperlipidemia, unspecified: Secondary | ICD-10-CM | POA: Diagnosis not present

## 2022-06-14 DIAGNOSIS — I480 Paroxysmal atrial fibrillation: Secondary | ICD-10-CM | POA: Diagnosis not present

## 2022-06-14 DIAGNOSIS — M549 Dorsalgia, unspecified: Secondary | ICD-10-CM | POA: Diagnosis not present

## 2022-06-14 DIAGNOSIS — I2510 Atherosclerotic heart disease of native coronary artery without angina pectoris: Secondary | ICD-10-CM | POA: Diagnosis not present

## 2022-06-14 DIAGNOSIS — Z87891 Personal history of nicotine dependence: Secondary | ICD-10-CM | POA: Diagnosis not present

## 2022-06-14 DIAGNOSIS — H9193 Unspecified hearing loss, bilateral: Secondary | ICD-10-CM

## 2022-06-14 DIAGNOSIS — D649 Anemia, unspecified: Secondary | ICD-10-CM | POA: Diagnosis not present

## 2022-06-14 DIAGNOSIS — G894 Chronic pain syndrome: Secondary | ICD-10-CM | POA: Diagnosis not present

## 2022-06-14 DIAGNOSIS — F32A Depression, unspecified: Secondary | ICD-10-CM | POA: Diagnosis not present

## 2022-06-14 DIAGNOSIS — Z7982 Long term (current) use of aspirin: Secondary | ICD-10-CM | POA: Diagnosis not present

## 2022-06-14 DIAGNOSIS — I10 Essential (primary) hypertension: Secondary | ICD-10-CM | POA: Diagnosis not present

## 2022-06-14 DIAGNOSIS — F419 Anxiety disorder, unspecified: Secondary | ICD-10-CM | POA: Diagnosis not present

## 2022-06-14 DIAGNOSIS — I739 Peripheral vascular disease, unspecified: Secondary | ICD-10-CM | POA: Diagnosis not present

## 2022-06-14 DIAGNOSIS — Z7983 Long term (current) use of bisphosphonates: Secondary | ICD-10-CM | POA: Diagnosis not present

## 2022-06-14 DIAGNOSIS — E039 Hypothyroidism, unspecified: Secondary | ICD-10-CM | POA: Diagnosis not present

## 2022-06-14 DIAGNOSIS — Z951 Presence of aortocoronary bypass graft: Secondary | ICD-10-CM | POA: Diagnosis not present

## 2022-06-14 DIAGNOSIS — Z7901 Long term (current) use of anticoagulants: Secondary | ICD-10-CM | POA: Diagnosis not present

## 2022-06-14 NOTE — Chronic Care Management (AMB) (Signed)
  Care Coordination   Note   06/14/2022 Name: Carmen [name] MRN: [id number] DOB: 09-Jul-1941  Carmen [name] is a 81 y.o. year old female who sees [place], Wyatt Mage, FNP for primary care. I reached out to [facility] by phone today to offer care coordination services.  [name] was given information about Care Coordination services today including:   The Care Coordination services include support from the care team which includes your Nurse Coordinator, Clinical Social Worker, or Pharmacist.  The Care Coordination team is here to help remove barriers to the health concerns and goals most important to you. Care Coordination services are voluntary, and the patient may decline or stop services at any time by request to their care team member.   Care Coordination Consent Status: Patient agreed to services and verbal consent obtained.   Follow up plan:  Telephone appointment with care coordination team member scheduled for:  06/15/2022  Encounter Outcome:  Pt. Scheduled  Burman Nieves, CCMA Care Coordination Care Guide Direct Dial: [phone]

## 2022-06-14 NOTE — Progress Notes (Signed)
The niece said whichever will check into her hearing. I know when I went to ENT in [place] they did a hearing test as well to see if I needed ENT or audiology.

## 2022-06-14 NOTE — Progress Notes (Signed)
Daughter wanted a referral for ENT for hearing loss. She was seen on 9/5 for this matter.

## 2022-06-14 NOTE — Telephone Encounter (Signed)
Post procedure phone call.  LM 

## 2022-06-15 ENCOUNTER — Ambulatory Visit: Payer: Medicare Other | Admitting: *Deleted

## 2022-06-15 ENCOUNTER — Ambulatory Visit: Payer: Self-pay

## 2022-06-15 NOTE — Patient Outreach (Signed)
  Care Coordination   06/15/2022 Name: Tasheba Henson MRN: [id number] DOB: [birth year]   Care Coordination Outreach Attempts:  An unsuccessful telephone outreach was attempted for a scheduled appointment today.  HIPAA compliant message left for patients daughter, Glean Hess with call back phone number.   Follow Up Plan:  Additional outreach attempts will be made to offer the patient care coordination information and services.   Encounter Outcome:  No Answer  Care Coordination Interventions Activated:  No   Care Coordination Interventions:  No, not indicated    George Ina RN,BSN,CCM [facility] Care Coordination [phone] direct line

## 2022-06-19 ENCOUNTER — Ambulatory Visit: Payer: Self-pay

## 2022-06-19 NOTE — Patient Outreach (Signed)
  Care Coordination   Follow Up Visit Note   06/19/2022 Name: Bryann Gentz MRN: [id number] DOB: 02-20-1941  Jaylynne Pickerill is a 81 y.o. year old female who sees [place], [place], [place] for primary care. I  spoke with patients daughter, Ace Gins.  HIPAA verified by daughter for patient.   What matters to the patients health and wellness today?  Daughter states patient has been complaining of cramping and pain in her right leg.  She states patient received radiofrequency ablation to her right back on 06/13/22 and is scheduled to have the left side done on 06/29/22.  Daughter states she does not feel this has helped the patient with leg pain.  Daughter states patient is currently managing her pain with medication and heating pad.   Goals Addressed             This Visit's Progress    Patient Stated:  Management of right leg pain       Care Coordination Interventions: Evaluation of current treatment plan related to right leg pain and patient's adherence to plan as established by provider Reviewed medications with patient and discussed compliance Reviewed scheduled/upcoming provider appointments  Discussed plans with patient for ongoing care management follow up and provided patient with direct contact information for care management team Contact primary care provider regarding ongoing leg pain to determine if follow up visit or referral to specialist for evaluation is needed Continue to take pain medications as prescribed and use heating pad for pain relief          SDOH assessments and interventions completed:  Yes  SDOH Interventions Today    Flowsheet Row Most Recent Value  SDOH Interventions   Food Insecurity Interventions Intervention Not Indicated  Housing Interventions Intervention Not Indicated  Transportation Interventions Intervention Not Indicated        Care Coordination Interventions Activated:  Yes  Care Coordination Interventions:  Yes, provided   Follow  up plan: Follow up call scheduled for 07/24/22 at 10 am    Encounter Outcome:  Pt. Visit Completed   Quinn Plowman RN,BSN,CCM [place] [phone] direct line

## 2022-06-19 NOTE — Patient Instructions (Signed)
Visit Information  Thank you for taking time to visit with me today. Please don't hesitate to contact me if I can be of assistance to you.   Following are the goals we discussed today:   Goals Addressed             This Visit's Progress    Patient Stated:  Management of right leg pain       Care Coordination Interventions: Evaluation of current treatment plan related to right leg pain and patient's adherence to plan as established by provider Reviewed medications with patient and discussed compliance Reviewed scheduled/upcoming provider appointments  Discussed plans with patient for ongoing care management follow up and provided patient with direct contact information for care management team Contact primary care provider regarding ongoing leg pain to determine if follow up visit or referral to specialist for evaluation is needed Continue to take pain medications as prescribed and use heating pad for pain relief          Our next appointment is by telephone on 07/24/22 at 10 am  Please call the care guide team at [phone] if you need to cancel or reschedule your appointment.   If you are experiencing a Mental Health or [place] or need someone to talk to, please call 1-800-273-TALK (toll free, 24 hour hotline)  Patient verbalizes understanding of instructions and care plan provided today and agrees to view in [place]. Active MyChart status and patient understanding of how to access instructions and care plan via MyChart confirmed with patient.     Quinn Plowman RN,BSN,CCM [place] Coordination [phone] direct line

## 2022-06-20 ENCOUNTER — Other Ambulatory Visit: Payer: Self-pay | Admitting: Internal Medicine

## 2022-06-20 DIAGNOSIS — R2990 Unspecified symptoms and signs involving the nervous system: Secondary | ICD-10-CM

## 2022-06-21 NOTE — Telephone Encounter (Signed)
Please review eliquis refill

## 2022-06-21 NOTE — Telephone Encounter (Signed)
Prescription refill request for Eliquis received. Indication:Afib Last office visit:6/23 Scr:0.9 Age: 81 Weight:84.8 kg  Prescription refilled

## 2022-06-22 ENCOUNTER — Ambulatory Visit: Payer: Medicare Other | Admitting: Family

## 2022-06-22 ENCOUNTER — Encounter: Payer: Self-pay | Admitting: Family

## 2022-06-22 ENCOUNTER — Ambulatory Visit (INDEPENDENT_AMBULATORY_CARE_PROVIDER_SITE_OTHER): Payer: Medicare Other | Admitting: Family

## 2022-06-22 VITALS — BP 130/82 | HR 91 | Temp 98.6°F | Resp 16 | Ht 61.0 in | Wt 186.4 lb

## 2022-06-22 DIAGNOSIS — R29898 Other symptoms and signs involving the musculoskeletal system: Secondary | ICD-10-CM

## 2022-06-22 DIAGNOSIS — S51001A Unspecified open wound of right elbow, initial encounter: Secondary | ICD-10-CM | POA: Insufficient documentation

## 2022-06-22 DIAGNOSIS — I83813 Varicose veins of bilateral lower extremities with pain: Secondary | ICD-10-CM | POA: Insufficient documentation

## 2022-06-22 DIAGNOSIS — N3000 Acute cystitis without hematuria: Secondary | ICD-10-CM

## 2022-06-22 DIAGNOSIS — E876 Hypokalemia: Secondary | ICD-10-CM

## 2022-06-22 DIAGNOSIS — R296 Repeated falls: Secondary | ICD-10-CM

## 2022-06-22 DIAGNOSIS — Z23 Encounter for immunization: Secondary | ICD-10-CM

## 2022-06-22 DIAGNOSIS — G894 Chronic pain syndrome: Secondary | ICD-10-CM

## 2022-06-22 DIAGNOSIS — R252 Cramp and spasm: Secondary | ICD-10-CM | POA: Diagnosis not present

## 2022-06-22 DIAGNOSIS — R2681 Unsteadiness on feet: Secondary | ICD-10-CM

## 2022-06-22 LAB — MAGNESIUM: Magnesium: 1.6 mg/dL (ref 1.5–2.5)

## 2022-06-22 MED ORDER — MUPIROCIN 2 % EX OINT: 1.0000 | TOPICAL_OINTMENT | Freq: Two times a day (BID) | CUTANEOUS | 0 refills | Status: DC

## 2022-06-22 MED ORDER — AMOXICILLIN-POT CLAVULANATE 500-125 MG PO TABS: 1.0000 | ORAL_TABLET | Freq: Two times a day (BID) | ORAL | 0 refills | Status: AC

## 2022-06-22 NOTE — Progress Notes (Signed)
Established Patient Office Visit  Subjective:  Patient ID: Carmen [name], female    DOB: January 26, 1941  Age: 81 y.o. MRN: [id number]  CC:  Chief Complaint  Patient presents with   Leg Pain    HPI Alexsus Alford is here today with concerns.   Recent fall again five days ago and landed on right hip to upper thigh and also hit her left elbow resulting in  wound that is beginning to heal. Someone called her name and she turned becoming unstable causing the fall. A lot of bruising on her right thigh, applying lidocaine 5% patch for the pain which has been helpful for her. She is not using her rollator.   9/12 had radiofrequency lumbar spine without much rleief, however likely to not start feeling relief for a few weeks after appt. She is scheduled to get other side completed at next visit.   Hypothyroid: recent increased levothyroxine to 112 mcg once daily on 9/10. Need to repeat in about four weeks.   Years ago with cold bil hands and bil feet even if warm outside. Then she c/o muscle cramps bil lower legs. Has had low magnesium in the past was supplemented with improvement. No recent check of magnesium. She did see vascular surgeon three years ago in [place], and was due for a work up however then relocated to [place] and then now living here locally. No record, but requesting vascular consult for here. When she walks she does report pain in the legs while walking.   Past Medical History:  Diagnosis Date   Abnormal CT scan, lumbar spine (05/11/2021) 05/17/2021   (05/11/2021) LUMBAR CT FINDINGS: Alignment: Lumbar levocurvature, apex L4. Mild lateral listhesis L4 on L5 of approximately 3 mm. Vertebrae: Remote appearing superior endplate deformities at T12 with 10% height loss and L1 with up to 20% height loss. Multilevel discogenic and facet degenerative changes. Mild bilateral SI joint arthrosis.  DISC LEVELS: T11-T12: Near complete disc height loss with de   Abnormal MRI, cervical spine (05/12/2021)  05/17/2021   (05/12/2021) CERVICAL MRI FINDINGS: Motion artifact is present. Posterior Fossa, vertebral arteries, paraspinal tissues: Left superior cerebellar infarct.   DISC LEVELS: C2-C3: Disc bulge with endplate osteophytes. Uncovertebral and facet hypertrophy. C3-C4: Disc bulge with endplate osteophytes. Uncovertebral and facet hypertrophy. Mild canal stenosis. Marked foraminal stenosis. C4-C5: Disc bulge w   Allergy    Anemia    Anxiety    Arthritis    Back pain    Coronary artery disease    Depression    History of stroke 10/02/2020   Hypertension    Peripheral vascular disease ([place])    Personal history of nicotine dependence 10/02/2020   Prsnl hx of TIA (TIA), and cereb infrc w/o resid deficits 10/02/2020   Stroke [facility])    Thyroid disease     Past Surgical History:  Procedure Laterality Date   back injections     CARDIAC CATHETERIZATION     CATARACT EXTRACTION     CORONARY ANGIOPLASTY     CORONARY ARTERY BYPASS GRAFT     2005    Family History  Problem Relation Age of Onset   Breast cancer Mother    Heart attack Father    Bipolar disorder Daughter    Diabetes Mellitus II Neg Hx     Social History   Socioeconomic History   Marital status: Widowed    Spouse name: Not on file   Number of children: 3   Years of education: Not on file  Highest education level: Not on file  Occupational History   Not on file  Tobacco Use   Smoking status: Former    Years: 30.00    Types: Cigarettes    Quit date: 05/2020    Years since quitting: 2.1   Smokeless tobacco: Never   Tobacco comments:    Smoked about 1 pack per month  Vaping Use   Vaping Use: Never used  Substance and Sexual Activity   Alcohol use: Not Currently   Drug use: Never   Sexual activity: Not Currently  Other Topics Concern   Not on file  Social History Narrative   ** Merged History Encounter **       Social Determinants of Health   Financial Resource Strain: Low Risk  (07/29/2021)   Overall  Financial Resource Strain (CARDIA)    Difficulty of Paying Living Expenses: Not hard at all  Food Insecurity: No Food Insecurity (06/19/2022)   Hunger Vital Sign    Worried About Running Out of Food in the Last Year: Never true    Ran Out of Food in the Last Year: Never true  Transportation Needs: No Transportation Needs (06/19/2022)   PRAPARE - Hydrologist (Medical): No    Lack of Transportation (Non-Medical): No  Physical Activity: Not on file  Stress: Not on file  Social Connections: Not on file  Intimate Partner Violence: Not on file    Outpatient Medications Prior to Visit  Medication Sig Dispense Refill   aspirin EC 81 MG tablet Take 81 mg by mouth daily. Swallow whole.     atorvastatin (LIPITOR) 40 MG tablet Take 1 tablet (40 mg total) by mouth daily. 90 tablet 0   calcium carbonate (OSCAL) 1500 (600 Ca) MG TABS tablet Take 1 tablet (1,500 mg total) by mouth 2 (two) times daily with a meal. 60 tablet 2   cetirizine (ZYRTEC) 10 MG tablet Take 10 mg by mouth daily.     CHOLECALCIFEROL PO Take 1 capsule by mouth daily.     Cyanocobalamin (VITAMIN B 12 PO) Take by mouth.     ELIQUIS 5 MG TABS tablet NEW PRESCRIPTION REQUEST: Eliquis 5 Mg TAKE ONE TABLET BY MOUTH TWICE DAILY 180 tablet 3   hydrochlorothiazide (HYDRODIURIL) 25 MG tablet Take 1 tablet (25 mg total) by mouth daily. 90 tablet 0   HYDROcodone-acetaminophen (NORCO/VICODIN) 5-325 MG tablet Take 1 tablet by mouth 2 (two) times daily as needed for severe pain. Must last 30 days. 60 tablet 0   [START ON 06/29/2022] HYDROcodone-acetaminophen (NORCO/VICODIN) 5-325 MG tablet Take 1 tablet by mouth 2 (two) times daily as needed for severe pain. Must last 30 days. 60 tablet 0   levothyroxine (SYNTHROID) 112 MCG tablet Take 1 tablet (112 mcg total) by mouth daily. 90 tablet 0   metoprolol succinate (TOPROL-XL) 100 MG 24 hr tablet Take 0.5 tablets (50 mg total) by mouth daily. 45 tablet 0    oxyCODONE-acetaminophen (PERCOCET) 5-325 MG tablet Take 0.5 tablets by mouth every 8 (eight) hours as needed for up to 7 days for severe pain. Must last 7 days. 11 tablet 0   traZODone (DESYREL) 100 MG tablet Take 1 tablet (100 mg total) by mouth at bedtime as needed for sleep. for sleep 90 tablet 1   divalproex (DEPAKOTE) 500 MG DR tablet Take 1 tablet (500 mg total) by mouth at bedtime. 30 tablet 0   HYDROcodone-acetaminophen (NORCO/VICODIN) 5-325 MG tablet Take 1 tablet by mouth 2 (two) times  daily as needed for severe pain. Must last 30 days. 60 tablet 0   oxyCODONE-acetaminophen (PERCOCET) 5-325 MG tablet Take 0.5 tablets by mouth every 8 (eight) hours as needed for up to 7 days for severe pain. Must last 7 days. 11 tablet 0   No facility-administered medications prior to visit.    No Known Allergies      Objective:    Physical Exam Constitutional:      General: She is not in acute distress.    Appearance: Normal appearance. She is not ill-appearing, toxic-appearing or diaphoretic.  Cardiovascular:     Rate and Rhythm: Normal rate and regular rhythm.     Pulses:          Dorsalis pedis pulses are 1+ on the right side and 1+ on the left side.       Posterior tibial pulses are 1+ on the right side and 1+ on the left side.  Pulmonary:     Effort: Pulmonary effort is normal.  Musculoskeletal:     Right elbow: Laceration (abrasion with slight erythema no drainage, dry scab growing over it slightly) present.     Right hip: Normal range of motion.     Left hip: Normal range of motion.     Right lower leg: No edema.     Left lower leg: No edema.     Comments: Right lateral thigh with ecchymosis extending from top right lateral thigh to mid upper lateral thigh, slight tenderness on palpation   Neurological:     General: No focal deficit present.     Mental Status: She is alert and oriented to person, place, and time. Mental status is at baseline.  Psychiatric:        Mood and  Affect: Mood normal.        Behavior: Behavior normal.        Thought Content: Thought content normal.        Judgment: Judgment normal.     BP 130/82   Pulse 91   Temp 98.6 F (37 C)   Resp 16   Ht _0  (1.549 m)   Wt 186 lb 6 oz (84.5 kg)   SpO2 99%   BMI 35.22 kg/m  Wt Readings from Last 3 Encounters:  06/22/22 186 lb 6 oz (84.5 kg)  06/13/22 187 lb (84.8 kg)  06/08/22 187 lb (84.8 kg)     Health Maintenance Due  Topic Date Due   COVID-19 Vaccine (1) Never done   TETANUS/TDAP  Never done   Zoster Vaccines- Shingrix (1 of 2) Never done   DEXA SCAN  Never done   INFLUENZA VACCINE  Never done    There are no preventive care reminders to display for this patient.  Lab Results  Component Value Date   TSH 8.500 (H) 06/08/2022   Lab Results  Component Value Date   WBC 10.3 05/08/2022   HGB 13.9 05/08/2022   HGB 13.1 05/08/2022   HCT 41.0 05/08/2022   HCT 39.2 05/08/2022   MCV 92.7 05/08/2022   PLT 216 05/08/2022   Lab Results  Component Value Date   NA 136 05/08/2022   NA 137 05/08/2022   K 3.6 05/08/2022   K 3.6 05/08/2022   CO2 22 05/08/2022   GLUCOSE 135 (H) 05/08/2022   GLUCOSE 138 (H) 05/08/2022   BUN 15 05/08/2022   BUN 13 05/08/2022   CREATININE 0.90 05/08/2022   CREATININE 0.97 05/08/2022   BILITOT 0.7 05/08/2022  ALKPHOS 42 05/08/2022   AST 26 05/08/2022   ALT 20 05/08/2022   PROT 7.0 05/08/2022   ALBUMIN 4.3 05/08/2022   CALCIUM 9.6 05/08/2022   ANIONGAP 13 05/08/2022   EGFR 71 01/11/2022   GFR 67.85 10/18/2021   Lab Results  Component Value Date   HGBA1C 6.3 (H) 05/08/2022      Assessment & Plan:   Problem List Items Addressed This Visit       Cardiovascular and Mediastinum   Varicose veins of both lower extremities with pain    Referral placed for vascular to assess circulatory status.        Relevant Orders   Ambulatory referral to Vascular Surgery     Genitourinary   Acute cystitis without hematuria    rx  augmentin 500/125 mg po bid x 10 days antbx sent to pharmacy, pt to take as directed. Encouraged increased water intake throughout the day. Urine culture positive for UTI. If no improvement in the next 2 days pt advised to let me know.       Relevant Medications   amoxicillin-clavulanate (AUGMENTIN) 500-125 MG tablet     Other   Hypomagnesemia    Chest magnesium pending results      Muscle cramping    Order magnesium pending results  May consider mag supplementation      Relevant Orders   Ambulatory referral to Vascular Surgery   Elbow wound, right, initial encounter - Primary    rx bactroban 2%  Please monitor site for worsening signs/symptoms of infection to include: increasing redness, increasing tenderness, increase in size, and or pustulant drainage from site. If this is to occur please let me know immediately.        Relevant Medications   mupirocin ointment (BACTROBAN) 2 %   Need for influenza vaccination    Flu  vaccine administered in office Pt tolerated procedure well  Verbal consent obtained prior to administration  Handout given in regards to vaccination.         Relevant Orders   Flu Vaccine QUAD High Dose(Fluad)   Hypokalemia   Relevant Orders   Magnesium (Completed)   Unsteady gait when walking    Strong d/w pt to ambulate with either rollator and or cane not on her own as unsteady and increasing with falls as of late      Multiple falls   RESOLVED: Bilateral leg weakness   Relevant Orders   Ambulatory referral to Vascular Surgery    Meds ordered this encounter  Medications   mupirocin ointment (BACTROBAN) 2 %    Sig: Apply 1 Application topically 2 (two) times daily.    Dispense:  22 g    Refill:  0    Order Specific Question:   Supervising Provider    Answer:   BEDSOLE, AMY E [[id number]]   amoxicillin-clavulanate (AUGMENTIN) 500-125 MG tablet    Sig: Take 1 tablet (500 mg total) by mouth 2 (two) times daily for 10 days.    Dispense:  20 tablet     Refill:  0    Order Specific Question:   Supervising Provider    Answer:   Diona Browner, AMY E [[id number]]    Follow-up: Return in about 1 month (around 07/22/2022) for f/u thyroid .    Eugenia Pancoast, FNP

## 2022-06-22 NOTE — Patient Instructions (Signed)
  A referral was placed today for vascular  Please let us know if you have not heard back within 2 weeks about the referral.  It was a pleasure seeing you today.   You were found to have a urinary tract infection, you have been prescribed an antibiotic to your preferred pharmacy. Please start antibiotic today as directed.   We are sending your urine for a culture to make sure you do not have a resistant bacteria. We will call you if we need to change your medications.   Please make sure you are drinking plenty of fluids over the next few days.  If your symptoms do not improve over the next 5-7 days, or if they worsen, please let us know. Please also let us know if you have worsening back pain, fevers, chills, or body aches.   Regards,   Eugenia Pancoast

## 2022-06-23 ENCOUNTER — Other Ambulatory Visit: Payer: Self-pay | Admitting: Family

## 2022-06-23 ENCOUNTER — Encounter: Payer: Self-pay | Admitting: Family

## 2022-06-23 DIAGNOSIS — E876 Hypokalemia: Secondary | ICD-10-CM | POA: Insufficient documentation

## 2022-06-23 DIAGNOSIS — R296 Repeated falls: Secondary | ICD-10-CM | POA: Insufficient documentation

## 2022-06-23 DIAGNOSIS — R2681 Unsteadiness on feet: Secondary | ICD-10-CM | POA: Insufficient documentation

## 2022-06-23 DIAGNOSIS — Z23 Encounter for immunization: Secondary | ICD-10-CM | POA: Insufficient documentation

## 2022-06-23 DIAGNOSIS — N3000 Acute cystitis without hematuria: Secondary | ICD-10-CM | POA: Insufficient documentation

## 2022-06-23 NOTE — Assessment & Plan Note (Signed)
rx bactroban 2%  Please monitor site for worsening signs/symptoms of infection to include: increasing redness, increasing tenderness, increase in size, and or pustulant drainage from site. If this is to occur please let me know immediately.

## 2022-06-23 NOTE — Assessment & Plan Note (Signed)
Strong d/w pt to ambulate with either rollator and or cane not on her own as unsteady and increasing with falls as of late

## 2022-06-23 NOTE — Assessment & Plan Note (Signed)
Flu  vaccine administered in office Pt tolerated procedure well  Verbal consent obtained prior to administration  Handout given in regards to vaccination.   

## 2022-06-23 NOTE — Assessment & Plan Note (Signed)
Referral placed for vascular to assess circulatory status.

## 2022-06-23 NOTE — Assessment & Plan Note (Addendum)
rx augmentin 500/125 mg po bid x 10 days antbx sent to pharmacy, pt to take as directed. Encouraged increased water intake throughout the day. Urine culture positive for UTI. If no improvement in the next 2 days pt advised to let me know.

## 2022-06-23 NOTE — Assessment & Plan Note (Signed)
Order magnesium pending results  May consider mag supplementation

## 2022-06-23 NOTE — Assessment & Plan Note (Signed)
Chest magnesium pending results

## 2022-06-26 ENCOUNTER — Telehealth: Payer: Self-pay | Admitting: Neurology

## 2022-06-26 NOTE — Telephone Encounter (Signed)
LVM and sent mychart msg asking pt to call back and schedule EEG.

## 2022-06-29 ENCOUNTER — Ambulatory Visit: Payer: Medicare Other | Attending: Pain Medicine | Admitting: Pain Medicine

## 2022-06-29 ENCOUNTER — Ambulatory Visit
Admission: RE | Admit: 2022-06-29 | Discharge: 2022-06-29 | Disposition: A | Discharge status: Home / Self Care | Payer: Medicare Other | Source: Ambulatory Visit | Attending: Pain Medicine | Admitting: Pain Medicine

## 2022-06-29 ENCOUNTER — Encounter: Payer: Self-pay | Admitting: Pain Medicine

## 2022-06-29 VITALS — BP 152/87 | HR 88 | Temp 97.0°F | Resp 16 | Ht 60.0 in | Wt 187.0 lb

## 2022-06-29 DIAGNOSIS — Z9181 History of falling: Secondary | ICD-10-CM

## 2022-06-29 DIAGNOSIS — M79604 Pain in right leg: Secondary | ICD-10-CM | POA: Diagnosis not present

## 2022-06-29 DIAGNOSIS — M25551 Pain in right hip: Secondary | ICD-10-CM

## 2022-06-29 DIAGNOSIS — Z7901 Long term (current) use of anticoagulants: Secondary | ICD-10-CM | POA: Diagnosis not present

## 2022-06-29 DIAGNOSIS — R2681 Unsteadiness on feet: Secondary | ICD-10-CM | POA: Diagnosis not present

## 2022-06-29 DIAGNOSIS — M7061 Trochanteric bursitis, right hip: Secondary | ICD-10-CM

## 2022-06-29 DIAGNOSIS — M47817 Spondylosis without myelopathy or radiculopathy, lumbosacral region: Secondary | ICD-10-CM | POA: Diagnosis not present

## 2022-06-29 DIAGNOSIS — E669 Obesity, unspecified: Secondary | ICD-10-CM | POA: Diagnosis not present

## 2022-06-29 DIAGNOSIS — E66812 Obesity, class 2: Secondary | ICD-10-CM

## 2022-06-29 DIAGNOSIS — Z9989 Dependence on other enabling machines and devices: Secondary | ICD-10-CM | POA: Diagnosis not present

## 2022-06-29 DIAGNOSIS — S7001XA Contusion of right hip, initial encounter: Secondary | ICD-10-CM | POA: Diagnosis not present

## 2022-06-29 DIAGNOSIS — G8929 Other chronic pain: Secondary | ICD-10-CM | POA: Diagnosis not present

## 2022-06-29 MED ORDER — MIDAZOLAM HCL 5 MG/5ML IJ SOLN
0.5000 mg | Freq: Once | INTRAMUSCULAR | Status: AC
  Administered 2022-06-29: 1 mg via INTRAVENOUS
  Filled 2022-06-29: qty 5

## 2022-06-29 MED ORDER — FENTANYL CITRATE (PF) 100 MCG/2ML IJ SOLN: 25.0000 ug | INTRAMUSCULAR | Status: DC | PRN

## 2022-06-29 MED ORDER — METHYLPREDNISOLONE ACETATE 80 MG/ML IJ SUSP
INTRAMUSCULAR | Status: AC
  Filled 2022-06-29: qty 1

## 2022-06-29 MED ORDER — IOHEXOL 180 MG/ML  SOLN
INTRAMUSCULAR | Status: AC
  Filled 2022-06-29: qty 20

## 2022-06-29 MED ORDER — LIDOCAINE HCL 2 % IJ SOLN
20.0000 mL | Freq: Once | INTRAMUSCULAR | Status: AC
  Administered 2022-06-29: 400 mg
  Filled 2022-06-29: qty 20

## 2022-06-29 MED ORDER — LACTATED RINGERS IV SOLN: Freq: Once | INTRAVENOUS | Status: AC

## 2022-06-29 MED ORDER — ROPIVACAINE HCL 2 MG/ML IJ SOLN
9.0000 mL | Freq: Once | INTRAMUSCULAR | Status: AC
  Administered 2022-06-29: 9 mL via PERINEURAL
  Filled 2022-06-29: qty 20

## 2022-06-29 MED ORDER — TRIAMCINOLONE ACETONIDE 40 MG/ML IJ SUSP: 40.0000 mg | Freq: Once | INTRAMUSCULAR | Status: DC

## 2022-06-29 MED ORDER — PENTAFLUOROPROP-TETRAFLUOROETH EX AERO
INHALATION_SPRAY | Freq: Once | CUTANEOUS | Status: AC
  Administered 2022-06-29: 30 via TOPICAL

## 2022-06-29 NOTE — Progress Notes (Signed)
Safety precautions to be maintained throughout the outpatient stay will include: orient to surroundings, keep bed in low position, maintain call bell within reach at all times, provide assistance with transfer out of bed and ambulation.  

## 2022-06-29 NOTE — Progress Notes (Signed)
Large bruise noted to right hip approx on right side down to above knee. Instructed her to rate hip pain only when doing diary . MD and myself instructed her to use her walker when ambulatory.

## 2022-06-29 NOTE — Patient Instructions (Addendum)
____________________________________________________________________________________________  Post-Procedure Discharge Instructions  Instructions: Apply ice:  Purpose: This will minimize any swelling and discomfort after procedure.  When: Day of procedure, as soon as you get home. How: Fill a plastic sandwich bag with crushed ice. Cover it with a small towel and apply to injection site. How long: (15 min on, 15 min off) Apply for 15 minutes then remove x 15 minutes.  Repeat sequence on day of procedure, until you go to bed. Apply heat:  Purpose: To treat any soreness and discomfort from the procedure. When: Starting the next day after the procedure. How: Apply heat to procedure site starting the day following the procedure. How long: May continue to repeat daily, until discomfort goes away. Food intake: Start with clear liquids (like water) and advance to regular food, as tolerated.  Physical activities: Keep activities to a minimum for the first 8 hours after the procedure. After that, then as tolerated. Driving: If you have received any sedation, be responsible and do not drive. You are not allowed to drive for 24 hours after having sedation. Blood thinner: (Applies only to those taking blood thinners) You may restart your blood thinner 6 hours after your procedure. Insulin: (Applies only to Diabetic patients taking insulin) As soon as you can eat, you may resume your normal dosing schedule. Infection prevention: Keep procedure site clean and dry. Shower daily and clean area with soap and water. Post-procedure Pain Diary: Extremely important that this be done correctly and accurately. Recorded information will be used to determine the next step in treatment. For the purpose of accuracy, follow these rules: Evaluate only the area treated. Do not report or include pain from an untreated area. For the purpose of this evaluation, ignore all other areas of pain, except for the treated area. After  your procedure, avoid taking a long nap and attempting to complete the pain diary after you wake up. Instead, set your alarm clock to go off every hour, on the hour, for the initial 8 hours after the procedure. Document the duration of the numbing medicine, and the relief you are getting from it. Do not go to sleep and attempt to complete it later. It will not be accurate. If you received sedation, it is likely that you were given a medication that may cause amnesia. Because of this, completing the diary at a later time may cause the information to be inaccurate. This information is needed to plan your care. Follow-up appointment: Keep your post-procedure follow-up evaluation appointment after the procedure (usually 2 weeks for most procedures, 6 weeks for radiofrequencies). DO NOT FORGET to bring you pain diary with you.   Expect: (What should I expect to see with my procedure?) From numbing medicine (AKA: Local Anesthetics): Numbness or decrease in pain. You may also experience some weakness, which if present, could last for the duration of the local anesthetic. Onset: Full effect within 15 minutes of injected. Duration: It will depend on the type of local anesthetic used. On the average, 1 to 8 hours.  From steroids (Applies only if steroids were used): Decrease in swelling or inflammation. Once inflammation is improved, relief of the pain will follow. Onset of benefits: Depends on the amount of swelling present. The more swelling, the longer it will take for the benefits to be seen. In some cases, up to 10 days. Duration: Steroids will stay in the system x 2 weeks. Duration of benefits will depend on multiple posibilities including persistent irritating factors. Side-effects: If present, they  may typically last 2 weeks (the duration of the steroids). Frequent: Cramps (if they occur, drink Gatorade and take over-the-counter Magnesium 450-500 mg once to twice a day); water retention with temporary  weight gain; increases in blood sugar; decreased immune system response; increased appetite. Occasional: Facial flushing (red, warm cheeks); mood swings; menstrual changes. Uncommon: Long-term decrease or suppression of natural hormones; bone thinning. (These are more common with higher doses or more frequent use. This is why we prefer that our patients avoid having any injection therapies in other practices.)  Very Rare: Severe mood changes; psychosis; aseptic necrosis. From procedure: Some discomfort is to be expected once the numbing medicine wears off. This should be minimal if ice and heat are applied as instructed.  Call if: (When should I call?) You experience numbness and weakness that gets worse with time, as opposed to wearing off. New onset bowel or bladder incontinence. (Applies only to procedures done in the spine)  Emergency Numbers: Durning business hours (Monday - Thursday, 8:00 AM - 4:00 PM) (Friday, 9:00 AM - 12:00 Noon): [phone] After hours: [phone] NOTE: If you are having a problem and are unable connect with, or to talk to a provider, then go to your nearest urgent care or emergency department. If the problem is serious and urgent, please call 911. ____________________________________________________________________________________________  ____________________________________________________________________________________________  Muscle Spasms & Cramps  Cause:  The most common cause of muscle spasms and cramps is vitamin and/or electrolyte (calcium, potassium, sodium, etc.) deficiencies.  Possible triggers: Sweating - causes loss of electrolytes thru the skin. Steroids - causes loss of electrolytes thru the urine.  Treatment: Gatorade (or any other electrolyte-replenishing drink) - Take 1, 8 oz glass with each meal (3 times a day). OTC (over-the-counter) Magnesium 400 to 500 mg - Take 1 tablet twice a day (one with breakfast and one before bedtime).  If you have kidney problems, talk to your primary care physician before taking any Magnesium. Tonic Water with quinine - Take 1, 8 oz glass before bedtime.   ____________________________________________________________________________________________  ____________________________________________________________________________________________  Post-Procedure Discharge Instructions  Instructions: Apply ice:  Purpose: This will minimize any swelling and discomfort after procedure.  When: Day of procedure, as soon as you get home. How: Fill a plastic sandwich bag with crushed ice. Cover it with a small towel and apply to injection site. How long: (15 min on, 15 min off) Apply for 15 minutes then remove x 15 minutes.  Repeat sequence on day of procedure, until you go to bed. Apply heat:  Purpose: To treat any soreness and discomfort from the procedure. When: Starting the next day after the procedure. How: Apply heat to procedure site starting the day following the procedure. How long: May continue to repeat daily, until discomfort goes away. Food intake: Start with clear liquids (like water) and advance to regular food, as tolerated.  Physical activities: Keep activities to a minimum for the first 8 hours after the procedure. After that, then as tolerated. Driving: If you have received any sedation, be responsible and do not drive. You are not allowed to drive for 24 hours after having sedation. Blood thinner: (Applies only to those taking blood thinners) You may restart your blood thinner 6 hours after your procedure. Insulin: (Applies only to Diabetic patients taking insulin) As soon as you can eat, you may resume your normal dosing schedule. Infection prevention: Keep procedure site clean and dry. Shower daily and clean area with soap and water. Post-procedure Pain Diary: Extremely important that  this be done correctly and accurately. Recorded information will be used to determine the next  step in treatment. For the purpose of accuracy, follow these rules: Evaluate only the area treated. Do not report or include pain from an untreated area. For the purpose of this evaluation, ignore all other areas of pain, except for the treated area. After your procedure, avoid taking a long nap and attempting to complete the pain diary after you wake up. Instead, set your alarm clock to go off every hour, on the hour, for the initial 8 hours after the procedure. Document the duration of the numbing medicine, and the relief you are getting from it. Do not go to sleep and attempt to complete it later. It will not be accurate. If you received sedation, it is likely that you were given a medication that may cause amnesia. Because of this, completing the diary at a later time may cause the information to be inaccurate. This information is needed to plan your care. Follow-up appointment: Keep your post-procedure follow-up evaluation appointment after the procedure (usually 2 weeks for most procedures, 6 weeks for radiofrequencies). DO NOT FORGET to bring you pain diary with you.   Expect: (What should I expect to see with my procedure?) From numbing medicine (AKA: Local Anesthetics): Numbness or decrease in pain. You may also experience some weakness, which if present, could last for the duration of the local anesthetic. Onset: Full effect within 15 minutes of injected. Duration: It will depend on the type of local anesthetic used. On the average, 1 to 8 hours.  From steroids (Applies only if steroids were used): Decrease in swelling or inflammation. Once inflammation is improved, relief of the pain will follow. Onset of benefits: Depends on the amount of swelling present. The more swelling, the longer it will take for the benefits to be seen. In some cases, up to 10 days. Duration: Steroids will stay in the system x 2 weeks. Duration of benefits will depend on multiple posibilities including persistent  irritating factors. Side-effects: If present, they may typically last 2 weeks (the duration of the steroids). Frequent: Cramps (if they occur, drink Gatorade and take over-the-counter Magnesium 450-500 mg once to twice a day); water retention with temporary weight gain; increases in blood sugar; decreased immune system response; increased appetite. Occasional: Facial flushing (red, warm cheeks); mood swings; menstrual changes. Uncommon: Long-term decrease or suppression of natural hormones; bone thinning. (These are more common with higher doses or more frequent use. This is why we prefer that our patients avoid having any injection therapies in other practices.)  Very Rare: Severe mood changes; psychosis; aseptic necrosis. From procedure: Some discomfort is to be expected once the numbing medicine wears off. This should be minimal if ice and heat are applied as instructed.  Call if: (When should I call?) You experience numbness and weakness that gets worse with time, as opposed to wearing off. New onset bowel or bladder incontinence. (Applies only to procedures done in the spine)  Emergency Numbers: Durning business hours (Monday - Thursday, 8:00 AM - 4:00 PM) (Friday, 9:00 AM - 12:00 Noon): [phone] After hours: [phone] NOTE: If you are having a problem and are unable connect with, or to talk to a provider, then go to your nearest urgent care or emergency department. If the problem is serious and urgent, please call 911. ____________________________________________________________________________________________

## 2022-06-29 NOTE — Progress Notes (Signed)
PROVIDER NOTE: Interpretation of information contained herein should be left to medically-trained personnel. Specific patient instructions are provided elsewhere under "Patient Instructions" section of medical record. This document was created in part using STT-dictation technology, any transcriptional errors that may result from this process are unintentional.  Patient: Carmen [name] Type: Established DOB: 1941/06/16 MRN: [id number] PCP: Mort Sawyers, FNP  Service: Procedure DOS: 06/29/2022 Setting: Ambulatory Location: Ambulatory outpatient facility Delivery: Face-to-face Provider: Oswaldo Done, MD Specialty: Interventional Pain Management Specialty designation: 09 Location: Outpatient facility Ref. Prov.: Mort Sawyers, FNP    Primary Reason for Visit: Interventional Pain Management Treatment.   CC: Leg Pain (right)  Procedure:               Type:  Intra-articular hip & peri-articular bursae injection #1  Laterality: Right (-RT)  Level: Lower pelvic and hip joint level.  Imaging: Fluoroscopy-guided         Anesthesia: Local anesthesia (1-2% Lidocaine) Anxiolysis: IV Versed 1.0 mg Sedation: No Sedation                       DOS: 06/29/2022  Performed by: Oswaldo Done, MD  Purpose: Diagnostic/Therapeutic Indications: Hip pain severe enough to impact quality of life or function. Rationale (medical necessity): procedure needed and proper for the diagnosis and/or treatment of Ms. Boerema's medical symptoms and needs. 1. Acute hip pain (Right)   2. Chronic hip pain (Right)   3. Greater trochanteric bursitis (Right)   4. Chronic lower extremity pain (Right)    History of recent fall    Spondylosis without myelopathy or radiculopathy, lumbosacral region    Chronic anticoagulation (Eliquis)    Obesity, Class II, BMI 35-39.9    At risk for falls    Unsteady gait when walking    Walker as ambulation aid    NAS-11 Pain score:   Pre-procedure: 10-Worst pain ever/10    Post-procedure: 1 /10   Note: The patient was initially scheduled today to come in to complete the radiofrequency ablation on her left lumbar facets.  However, she fell around the week of September 18th and injured her right hip.  Today she comes in indicating that her worst pain is that of the right lower extremity.  Physical exam reveals a bruise in the lateral aspect of the right hip going all the way from the greater trochanter to almost the level of her right knee.  This bruise is right over the greater trochanteric bursa.  She has exquisite tenderness to palpation over the right trochanteric bursa and Patrick maneuver today was positive for right hip arthralgia with decreased range of motion.  Attempts at having the patient externally rotate the hip triggers her hip and leg pain.  For this reason we have changed today's procedure for a right hip injection.   Intra-articular Target: Superior aspect of the hip joint cavity, going thru the superior portion of the capsular ligament.  Bursae Target: Trochanteric Bursa Location: Intra-articular & peri-articular Region: Hip joint, upper (proximal) femoral region Approach: Percutaneous lateral approach. Type of procedure: Percutaneous joint injection   Position / Prep / Materials:  Position: Lateral Decubitus with affected side up  Prep solution: DuraPrep (Iodine Povacrylex [0.7% available iodine] and Isopropyl Alcohol, 74% w/w) Prep Area:  Entire Posterolateral hip area. Materials:  Tray: Block tray Needle(s):  Type: Spinal  Gauge (G): 22  Length: 7-in  Qty: 1  Pre-op H&P Assessment:  Ms. Coughlin is a 81 y.o. (year old), female patient,  seen today for interventional treatment. She  has a past surgical history that includes Cardiac catheterization; Coronary angioplasty; Coronary artery bypass graft; back injections; and Cataract extraction. Ms. Malinoski has a current medication list which includes the following prescription(s):  amoxicillin-clavulanate, aspirin ec, atorvastatin, calcium carbonate, cetirizine, cholecalciferol, cyanocobalamin, eliquis, hydrochlorothiazide, hydrocodone-acetaminophen, hydrocodone-acetaminophen, levothyroxine, metoprolol succinate, mupirocin ointment, trazodone, divalproex, and hydrocodone-acetaminophen, and the following Facility-Administered Medications: lactated ringers. Her primarily concern today is the Leg Pain (right)  Initial Vital Signs:  Pulse/HCG Rate: 84  Temp: (!) 97 F (36.1 C) Resp: 18 BP: (!) 154/98 SpO2: 97 %  BMI: Estimated body mass index is 36.52 kg/m as calculated from the following:   Height as of this encounter: 5' (1.524 m).   Weight as of this encounter: 187 lb (84.8 kg).  Risk Assessment: Allergies: Reviewed. She has No Known Allergies.  Allergy Precautions: None required Coagulopathies: Reviewed. None identified.  Blood-thinner therapy: None at this time Active Infection(s): Reviewed. None identified. Ms. Spegal is afebrile  Site Confirmation: Ms. Troeger was asked to confirm the procedure and laterality before marking the site Procedure checklist: Completed Consent: Before the procedure and under the influence of no sedative(s), amnesic(s), or anxiolytics, the patient was informed of the treatment options, risks and possible complications. To fulfill our ethical and legal obligations, as recommended by the American Medical Association's Code of Ethics, I have informed the patient of my clinical impression; the nature and purpose of the treatment or procedure; the risks, benefits, and possible complications of the intervention; the alternatives, including doing nothing; the risk(s) and benefit(s) of the alternative treatment(s) or procedure(s); and the risk(s) and benefit(s) of doing nothing. The patient was provided information about the general risks and possible complications associated with the procedure. These may include, but are not limited to: failure to  achieve desired goals, infection, bleeding, organ or nerve damage, allergic reactions, paralysis, and death. In addition, the patient was informed of those risks and complications associated to the procedure, such as failure to decrease pain; infection; bleeding; organ or nerve damage with subsequent damage to sensory, motor, and/or autonomic systems, resulting in permanent pain, numbness, and/or weakness of one or several areas of the body; allergic reactions; (i.e.: anaphylactic reaction); and/or death. Furthermore, the patient was informed of those risks and complications associated with the medications. These include, but are not limited to: allergic reactions (i.e.: anaphylactic or anaphylactoid reaction(s)); adrenal axis suppression; blood sugar elevation that in diabetics may result in ketoacidosis or comma; water retention that in patients with history of congestive heart failure may result in shortness of breath, pulmonary edema, and decompensation with resultant heart failure; weight gain; swelling or edema; medication-induced neural toxicity; particulate matter embolism and blood vessel occlusion with resultant organ, and/or nervous system infarction; and/or aseptic necrosis of one or more joints. Finally, the patient was informed that Medicine is not an exact science; therefore, there is also the possibility of unforeseen or unpredictable risks and/or possible complications that may result in a catastrophic outcome. The patient indicated having understood very clearly. We have given the patient no guarantees and we have made no promises. Enough time was given to the patient to ask questions, all of which were answered to the patient's satisfaction. Ms. Zunker has indicated that she wanted to continue with the procedure. Attestation: I, the ordering provider, attest that I have discussed with the patient the benefits, risks, side-effects, alternatives, likelihood of achieving goals, and potential  problems during recovery for the procedure that I have provided  informed consent. Date  Time: 06/29/2022  8:10 AM  Pre-Procedure Preparation:  Monitoring: As per clinic protocol. Respiration, ETCO2, SpO2, BP, heart rate and rhythm monitor placed and checked for adequate function Safety Precautions: Patient was assessed for positional comfort and pressure points before starting the procedure. Time-out: I initiated and conducted the "Time-out" before starting the procedure, as per protocol. The patient was asked to participate by confirming the accuracy of the "Time Out" information. Verification of the correct person, site, and procedure were performed and confirmed by me, the nursing staff, and the patient. "Time-out" conducted as per Joint Commission's Universal Protocol (UP.01.01.01). Time: 0829  Description/Narrative of Procedure:          Rationale (medical necessity): procedure needed and proper for the diagnosis and/or treatment of the patient's medical symptoms and needs. Procedural Technique Safety Precautions: Aspiration looking for blood return was conducted prior to all injections. At no point did we inject any substances, as a needle was being advanced. No attempts were made at seeking any paresthesias. Safe injection practices and needle disposal techniques used. Medications properly checked for expiration dates. SDV (single dose vial) medications used. Description of the Procedure: Protocol guidelines were followed. The patient was assisted into a comfortable position. The target area was identified and the area prepped in the usual manner. Skin & deeper tissues infiltrated with local anesthetic. Appropriate amount of time allowed to pass for local anesthetics to take effect. The procedure needles were then advanced to the target area. Proper needle placement secured. Negative aspiration confirmed. Solution injected in intermittent fashion, asking for systemic symptoms every 0.5cc of  injectate. The needles were then removed and the area cleansed, making sure to leave some of the prepping solution back to take advantage of its long term bactericidal properties.  Technical description of procedure:  Skin & deeper tissues infiltrated with local anesthetic. Appropriate amount of time allowed to pass for local anesthetics to take effect. The procedure needles were then advanced to the target area. Proper needle placement secured. Negative aspiration confirmed. Solution injected in intermittent fashion, asking for systemic symptoms every 0.5cc of injectate. The needles were then removed and the area cleansed, making sure to leave some of the prepping solution back to take advantage of its long term bactericidal properties.           Vitals:   06/29/22 0830 06/29/22 0835 06/29/22 0840 06/29/22 0849  BP: (!) 150/97 (!) 151/92 (!) 153/100 (!) 152/87  Pulse: 91 84 89 88  Resp: 16 (!) 21 20 16   Temp:      SpO2: 97% 98% 98% 97%  Weight:      Height:         Start Time: 0829 hrs. End Time: 0839 hrs.  Imaging Guidance (Non-Spinal):          Type of Imaging Technique: Fluoroscopy Guidance (Non-Spinal) Indication(s): Assistance in needle guidance and placement for procedures requiring needle placement in or near specific anatomical locations not easily accessible without such assistance. Exposure Time: Please see nurses notes. Contrast: Before injecting any contrast, we confirmed that the patient did not have an allergy to iodine, shellfish, or radiological contrast. Once satisfactory needle placement was completed at the desired level, radiological contrast was injected. Contrast injected under live fluoroscopy. No contrast complications. See chart for type and volume of contrast used. Fluoroscopic Guidance: I was personally present during the use of fluoroscopy. "Tunnel Vision Technique" used to obtain the best possible view of the target area. Parallax error  corrected before  commencing the procedure. "Direction-depth-direction" technique used to introduce the needle under continuous pulsed fluoroscopy. Once target was reached, antero-posterior, oblique, and lateral fluoroscopic projection used confirm needle placement in all planes. Images permanently stored in EMR. Interpretation: I personally interpreted the imaging intraoperatively. Adequate needle placement confirmed in multiple planes. Appropriate spread of contrast into desired area was observed. No evidence of afferent or efferent intravascular uptake. Permanent images saved into the patient's record.  Post-operative Assessment:  Post-procedure Vital Signs:  Pulse/HCG Rate: 88  Temp:  (!) 97 F (36.1 C) Resp: 16 BP:  (!) 152/87 SpO2: 97 %  EBL: None  Complications: No immediate post-treatment complications observed by team, or reported by patient.  Note: The patient tolerated the entire procedure well. A repeat set of vitals were taken after the procedure and the patient was kept under observation following institutional policy, for this type of procedure. Post-procedural neurological assessment was performed, showing return to baseline, prior to discharge. The patient was provided with post-procedure discharge instructions, including a section on how to identify potential problems. Should any problems arise concerning this procedure, the patient was given instructions to immediately contact us, at any time, without hesitation. In any case, we plan to contact the patient by telephone for a follow-up status report regarding this interventional procedure.  Comments:  No additional relevant information.  Plan of Care  Orders:  Orders Placed This Encounter  Procedures   DG PAIN CLINIC C-ARM 1-60 MIN NO REPORT    Intraoperative interpretation by procedural physician at [facility].    Standing Status:   Standing    Number of Occurrences:   1    Order Specific Question:   Reason for exam:     Answer:   Assistance in needle guidance and placement for procedures requiring needle placement in or near specific anatomical locations not easily accessible without such assistance.   Informed Consent Details: Physician/Practitioner Attestation; Transcribe to consent form and obtain patient signature    Nursing Order: Transcribe to consent form and obtain patient signature. Note: Always confirm laterality of pain with Ms. Dada, before procedure.    Order Specific Question:   Physician/Practitioner attestation of informed consent for procedure/surgical case    Answer:   I, the physician/practitioner, attest that I have discussed with the patient the benefits, risks, side effects, alternatives, likelihood of achieving goals and potential problems during recovery for the procedure that I have provided informed consent.    Order Specific Question:   Procedure    Answer:   Lumbar Facet Radiofrequency Ablation    Order Specific Question:   Physician/Practitioner performing the procedure    Answer:   Delainey Winstanley A. 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   Care order/instruction: Please confirm that the patient has stopped the Eliquis (Apixaban) x 3 days prior to procedure or surgery.    Please confirm that the patient has stopped the Eliquis (Apixaban) x 3 days prior to procedure or surgery.    Standing Status:   Standing    Number of Occurrences:   1   Bleeding precautions    Standing Status:   Standing    Number of Occurrences:   1   Chronic Opioid Analgesic:  Hydrocodone/APAP 5/325, 1 tab p.o. BID (10 mg/day of hydrocodone) (10 MME) (last filled on 05/02/2021) MME/day: 10 mg/day   Medications ordered for procedure: Meds  ordered this encounter  Medications   lidocaine (XYLOCAINE) 2 % (with pres) injection 400 mg   pentafluoroprop-tetrafluoroeth (GEBAUERS) aerosol   lactated ringers infusion   midazolam (VERSED) 5 MG/5ML injection 0.5-2 mg    Make sure Flumazenil is available in the pyxis when using this medication. If oversedation occurs, administer 0.2 mg IV over 15 sec. If after 45 sec no response, administer 0.2 mg again over 1 min; may repeat at 1 min intervals; not to exceed 4 doses (1 mg)   DISCONTD: fentaNYL (SUBLIMAZE) injection 25-50 mcg    Make sure Narcan is available in the pyxis when using this medication. In the event of respiratory depression (RR< 8/min): Titrate NARCAN (naloxone) in increments of 0.1 to 0.2 mg IV at 2-3 minute intervals, until desired degree of reversal.   DISCONTD: triamcinolone acetonide (KENALOG-40) injection 40 mg   ropivacaine (PF) 2 mg/mL (0.2%) (NAROPIN) injection 9 mL   Medications administered: We administered lidocaine, pentafluoroprop-tetrafluoroeth, lactated ringers, midazolam, and ropivacaine (PF) 2 mg/mL (0.2%).  See the medical record for exact dosing, route, and time of administration.  Follow-up plan:   Return in about 2 weeks (around 07/13/2022) for Proc-day (T,Th), (F2F), (PPE).       Interventional Therapies  Risk  Complexity Considerations:   Estimated body mass index is 34.2 kg/m as calculated from the following:   Height as of this encounter: 5\' 1"  (1.549 m).   Weight as of this encounter: 181 lb (82.1 kg). ELIQUIS + ASA Anticoagulation (Stop: 3 days  Restart: 6 hours)   Planned  Pending:   Diagnostic left lumbar facet RFA #1    Under consideration:   Diagnostic/therapeutic cervical ESI  Therapeutic bilateral lumbar facet RFA #1  Diagnostic/therapeutic right L4-5 LESI #1  Diagnostic/therapeutic right L4 and L5 TFESI #1    Completed:   Therapeutic right lumbar facet RFA x1 (06/13/2022)  Diagnostic bilateral lumbar facet MBB x3 (01/26/2022)  (100/100/90/90)  Diagnostic left cervical facet MBB x1 (06/28/2021) (25/25/0/0)    Therapeutic  Palliative (PRN) options:   Therapeutic lumbar facet MBB     Recent Visits Date Type Provider Dept  06/13/22 Procedure visit 08/13/22, MD [facility]  04/17/22 Office Visit 04/19/22, MD [facility]  Showing recent visits within past 90 days and meeting all other requirements Today's Visits Date Type Provider Dept  06/29/22 Procedure visit 07/01/22, MD [facility]  Showing today's visits and meeting all other requirements Future Appointments Date Type Provider Dept  07/17/22 Appointment 07/19/22, MD [facility]  Showing future appointments within next 90 days and meeting all other requirements  Disposition: Discharge home  Discharge (Date  Time): 06/29/2022; 0855 hrs.   Primary Care Physician: 07/01/2022, FNP Location: [facility] Note by: [facility], MD Date: 06/29/2022; Time: 8:53 AM  Disclaimer:  Medicine is not an 07/01/2022. The only guarantee in medicine is that nothing is guaranteed. It is important to note that the decision to proceed with this intervention was based on the information collected from the patient. The Data and conclusions were drawn from the patient's questionnaire, the interview, and the physical examination. Because the information was provided in large part by the patient, it cannot be guaranteed that it has not been purposely or unconsciously manipulated. Every effort has been made  to obtain as much relevant data as possible for this evaluation. It is important to note that the conclusions that lead to this procedure are derived in large part from the available data. Always take into account that the treatment will also be dependent on availability of resources and existing treatment guidelines, considered by other Pain Management Practitioners  as being common knowledge and practice, at the time of the intervention. For Medico-Legal purposes, it is also important to point out that variation in procedural techniques and pharmacological choices are the acceptable norm. The indications, contraindications, technique, and results of the above procedure should only be interpreted and judged by a Board-Certified Interventional Pain Specialist with extensive familiarity and expertise in the same exact procedure and technique.

## 2022-06-30 ENCOUNTER — Telehealth: Payer: Self-pay

## 2022-06-30 NOTE — Telephone Encounter (Signed)
Callod PP. No answer. Left message to call if needed.

## 2022-07-05 ENCOUNTER — Telehealth: Payer: Self-pay | Admitting: Neurology

## 2022-07-05 DIAGNOSIS — Z8673 Personal history of transient ischemic attack (TIA), and cerebral infarction without residual deficits: Secondary | ICD-10-CM

## 2022-07-05 DIAGNOSIS — G459 Transient cerebral ischemic attack, unspecified: Secondary | ICD-10-CM

## 2022-07-05 DIAGNOSIS — R55 Syncope and collapse: Secondary | ICD-10-CM

## 2022-07-05 NOTE — Telephone Encounter (Signed)
New order placed

## 2022-07-05 NOTE — Progress Notes (Signed)
[employer] did not view their my chart test result that I responded to one week ago.  Please call them and advise them of the below results.

## 2022-07-05 NOTE — Addendum Note (Signed)
Addended by: Florian Buff C on: 07/05/2022 10:24 AM   Modules accepted: Orders

## 2022-07-05 NOTE — Telephone Encounter (Signed)
Order placed incorrectly for cardiac event monitor, please resend for [facility] 

## 2022-07-12 NOTE — Progress Notes (Signed)
Does pain management t typically prescribe pt the lidocaine pain patches?

## 2022-07-13 MED ORDER — LIDOCAINE 5 % EX PTCH: 1.0000 | MEDICATED_PATCH | CUTANEOUS | 0 refills | Status: DC

## 2022-07-13 NOTE — Addendum Note (Signed)
Addended by: Eugenia Pancoast on: 07/13/2022 08:44 AM   Modules accepted: Orders

## 2022-07-15 NOTE — Progress Notes (Unsigned)
PROVIDER NOTE: Information contained herein reflects review and annotations entered in association with encounter. Interpretation of such information and data should be left to medically-trained personnel. Information provided to patient can be located elsewhere in the medical record under "Patient Instructions". Document created using STT-dictation technology, any transcriptional errors that may result from process are unintentional.    Patient: Carmen [name]  Service Category: E/M  Provider: Gaspar Cola, MD  DOB: Feb 17, 1941  DOS: 07/17/2022  Referring Provider: Eugenia Pancoast, [place]  MRN: [id number]  Specialty: Interventional Pain Management  PCP: Eugenia Pancoast, FNP  Type: Established Patient  Setting: Ambulatory outpatient    Location: Office  Delivery: Face-to-face     HPI  Carmen [name], a 81 y.o. year old female, is here today because of her No primary diagnosis found.. Carmen [name]'s primary complain today is No chief complaint on file. Last encounter: My last encounter with her was on 06/29/2022. Pertinent problems: Carmen [name] has Peripheral vascular disease ([place]); Other chronic pain; Chronic low back pain (1ry area of Pain) (Bilateral) (R>L) w/o sciatica; Dorsalgia, unspecified; Chronic pain syndrome; Cervicalgia; Chronic neck pain (2ry area of Pain) (Bilateral) (L>R); Chronic lower extremity pain (3ry area of Pain) (Bilateral) (R>L); Chronic hand pain (Left); Chronic hand pain (Right); Lumbar compression fracture (Old) (Multilevel), sequela; Chronic cervical radiculopathy (Bilateral); DDD (degenerative disc disease), cervical; Foraminal stenosis of cervical region; Lumbar facet syndrome (Bilateral); Lumbosacral facet hypertrophy (Multilevel) (Bilateral); Lumbar facet arthropathy (Multilevel) (Bilateral); 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[place]); Cervical facet syndrome (Bilateral); Cervical facet hypertrophy (Multilevel) (Bilateral); Spondylosis without myelopathy or  radiculopathy, cervical region; Spondylosis without myelopathy or radiculopathy, lumbosacral region; Other intervertebral disc degeneration, lumbar region; Abnormal MRI, cervical spine (05/12/2021); Abnormal CT scan, lumbar spine (05/11/2021); DDD (degenerative disc disease), lumbosacral; Levoscoliosis of lumbar spine; Lumbosacral foraminal stenosis (Multilevel) (Bilateral); Degenerative lateral spondylolisthesis of L4/L5 (58m); Baastrup's syndrome; Muscle cramping; Elbow wound, right, initial encounter; Unsteady gait when walking; Chronic hip pain (Right); Acute hip pain (Right); Greater trochanteric bursitis (Right); Chronic lower extremity pain (Right); and Hip hematoma, right, initial encounter on their pertinent problem list. Pain Assessment: Severity of   is reported as a  /10. Location:    / . Onset:  . Quality:  . Timing:  . Modifying factor(s):  .Marland KitchenVitals:  vitals were not taken for this visit.   Reason for encounter:  *** . ***  Pharmacotherapy Assessment  Analgesic: Hydrocodone/APAP 5/325, 1 tab p.o. BID (10 mg/day of hydrocodone) (10 MME) (last filled on 05/02/2021) MME/day: 10 mg/day   Monitoring: [place] PMP: PDMP reviewed during this encounter.       Pharmacotherapy: No side-effects or adverse reactions reported. Compliance: No problems identified. Effectiveness: Clinically acceptable.  No notes on file  No results found for: "CBDTHCR" No results found for: "D8THCCBX" No results found for: "D9THCCBX"  UDS:  Summary  Date Value Ref Range Status  04/18/2022 Note  Final    Comment:    ==================================================================== [employer] (MW) ==================================================================== Test                             Result       Flag       Units  Drug Present and Declared for Prescription Verification   7-aminoclonazepam              111          EXPECTED   ng/mg creat    7-aminoclonazepam is an expected  metabolite of  clonazepam. Source of    clonazepam is a scheduled prescription medication.    Hydrocodone                    1142         EXPECTED   ng/mg creat   Hydromorphone                  151          EXPECTED   ng/mg creat   Dihydrocodeine                 126          EXPECTED   ng/mg creat   Norhydrocodone                 1954         EXPECTED   ng/mg creat    Sources of hydrocodone include scheduled prescription medications.    Hydromorphone, dihydrocodeine and norhydrocodone are expected    metabolites of hydrocodone. Hydromorphone and dihydrocodeine are    also available as scheduled prescription medications.  ==================================================================== Test                      Result    Flag   Units      Ref Range   Creatinine              57               mg/dL      >=20 ==================================================================== Declared Medications:  The flagging and interpretation on this report are based on the  following declared medications.  Unexpected results may arise from  inaccuracies in the declared medications.   **Note: The testing scope of this panel includes these medications:   Clonazepam (Klonopin)  Hydrocodone (Norco)   **Note: The testing scope of this panel does not include the  following reported medications:   Acetaminophen (Norco)  Apixaban (Eliquis)  Aspirin  Atorvastatin (Lipitor)  Calcium  Cetirizine (Zyrtec)  Hydrochlorothiazide  Levothyroxine (Synthroid)  Metoprolol (Toprol)  Trazodone (Desyrel) ==================================================================== For clinical consultation, please call [phone]. ====================================================================       ROS  Constitutional: Denies any fever or chills Gastrointestinal: No reported hemesis, hematochezia, vomiting, or acute GI distress Musculoskeletal: Denies any acute onset joint swelling, redness, loss of ROM, or  weakness Neurological: No reported episodes of acute onset apraxia, aphasia, dysarthria, agnosia, amnesia, paralysis, loss of coordination, or loss of consciousness  Medication Review  Cholecalciferol, Cyanocobalamin, HYDROcodone-acetaminophen, apixaban, aspirin EC, atorvastatin, calcium carbonate, cetirizine, divalproex, hydrochlorothiazide, levothyroxine, lidocaine, metoprolol succinate, mupirocin ointment, and traZODone  History Review  Allergy: Carmen [name] has No Known Allergies. Drug: Carmen [name]  reports no history of drug use. Alcohol:  reports that she does not currently use alcohol. Tobacco:  reports that she quit smoking about 2 years ago. Her smoking use included cigarettes. She has never used smokeless tobacco. Social: Carmen [name]  reports that she quit smoking about 2 years ago. Her smoking use included cigarettes. She has never used smokeless tobacco. She reports that she does not currently use alcohol. She reports that she does not use drugs. Medical:  has a past medical history of Abnormal CT scan, lumbar spine (05/11/2021) (05/17/2021), Abnormal MRI, cervical spine (05/12/2021) (05/17/2021), Allergy, Anemia, Anxiety, Arthritis, Back pain, Coronary artery disease, Depression, History of stroke (10/02/2020), Hypertension, Peripheral vascular disease ([place]), Personal history of nicotine dependence (10/02/2020), Prsnl hx of TIA (TIA),  and cereb infrc w/o resid deficits (10/02/2020), Stroke [facility]), and Thyroid disease. Surgical: Carmen [name]  has a past surgical history that includes Cardiac catheterization; Coronary angioplasty; Coronary artery bypass graft; back injections; and Cataract extraction. Family: family history includes Bipolar disorder in her daughter; Breast cancer in her mother; Heart attack in her father.  Laboratory Chemistry Profile   Renal Lab Results  Component Value Date   BUN 15 05/08/2022   BUN 13 05/08/2022   CREATININE 0.90 05/08/2022   CREATININE 0.97 05/08/2022   BCR 22  01/11/2022   GFR 67.85 10/18/2021   GFRAA 73 11/22/2020   GFRNONAA 59 (L) 05/08/2022    Hepatic Lab Results  Component Value Date   AST 26 05/08/2022   ALT 20 05/08/2022   ALBUMIN 4.3 05/08/2022   ALKPHOS 42 05/08/2022   AMMONIA 19 10/06/2020    Electrolytes Lab Results  Component Value Date   NA 136 05/08/2022   NA 137 05/08/2022   K 3.6 05/08/2022   K 3.6 05/08/2022   CL 101 05/08/2022   CL 102 05/08/2022   CALCIUM 9.6 05/08/2022   MG 1.6 06/22/2022    Bone Lab Results  Component Value Date   25OHVITD1 12 (L) 03/14/2021   25OHVITD2 <1.0 03/14/2021   25OHVITD3 12 03/14/2021    Inflammation (CRP: Acute Phase) (ESR: Chronic Phase) Lab Results  Component Value Date   CRP 0.8 03/14/2021   ESRSEDRATE 4 03/14/2021         Note: Above Lab results reviewed.  Recent Imaging Review  DG PAIN CLINIC C-ARM 1-60 MIN NO REPORT Fluoro was used, but no Radiologist interpretation will be provided.  Please refer to "NOTES" tab for provider progress note. Note: Reviewed        Physical Exam  General appearance: Well nourished, well developed, and well hydrated. In no apparent acute distress Mental status: Alert, oriented x 3 (person, place, & time)       Respiratory: No evidence of acute respiratory distress Eyes: PERLA Vitals: There were no vitals taken for this visit. BMI: Estimated body mass index is 36.52 kg/m as calculated from the following:   Height as of 06/29/22: 5' (1.524 m).   Weight as of 06/29/22: 187 lb (84.8 kg). Ideal: Patient weight not recorded  Assessment   Diagnosis Status  No diagnosis found. Controlled Controlled Controlled   Updated Problems: No problems updated.   Plan of Care  Problem-specific:  No problem-specific Assessment & Plan notes found for this encounter.  Carmen [name] has a current medication list which includes the following long-term medication(s): atorvastatin, calcium carbonate, cetirizine, divalproex, eliquis,  hydrochlorothiazide, hydrocodone-acetaminophen, hydrocodone-acetaminophen, hydrocodone-acetaminophen, levothyroxine, metoprolol succinate, and trazodone.  Pharmacotherapy (Medications Ordered): No orders of the defined types were placed in this encounter.  Orders:  No orders of the defined types were placed in this encounter.  Follow-up plan:   No follow-ups on file.     Interventional Therapies  Risk  Complexity Considerations:   Estimated body mass index is 34.2 kg/m as calculated from the following:   Height as of this encounter: 5' 1"  (1.549 m).   Weight as of this encounter: 181 lb (82.1 kg). ELIQUIS + ASA Anticoagulation (Stop: 3 days  Restart: 6 hours)   Planned  Pending:   Diagnostic left lumbar facet RFA #1    Under consideration:   Diagnostic/therapeutic cervical ESI  Therapeutic bilateral lumbar facet RFA #1  Diagnostic/therapeutic right L4-5 LESI #1  Diagnostic/therapeutic right L4 and L5 TFESI #1  Completed:   Therapeutic right lumbar facet RFA x1 (06/13/2022)  Diagnostic bilateral lumbar facet MBB x3 (01/26/2022) (100/100/90/90)  Diagnostic left cervical facet MBB x1 (06/28/2021) (25/25/0/0)    Therapeutic  Palliative (PRN) options:   Therapeutic lumbar facet MBB      Recent Visits Date Type Provider Dept  06/29/22 Procedure visit Milinda Pointer, MD [facility]  06/13/22 Procedure visit Milinda Pointer, MD [facility]  04/17/22 Office Visit Milinda Pointer, MD [facility]  Showing recent visits within past 90 days and meeting all other requirements Future Appointments Date Type Provider Dept  07/17/22 Appointment Milinda Pointer, MD [facility]  Showing future appointments within next 90 days and meeting all other requirements  I discussed the assessment and treatment plan with the patient. The patient was provided an opportunity to ask questions and all were answered. The patient agreed with the  plan and demonstrated an understanding of the instructions.  Patient advised to call back or seek an in-person evaluation if the symptoms or condition worsens.  Duration of encounter: *** minutes.  Total time on encounter, as per AMA guidelines included both the face-to-face and non-face-to-face time personally spent by the physician and/or other qualified health care professional(s) on the day of the encounter (includes time in activities that require the physician or other qualified health care professional and does not include time in activities normally performed by clinical staff). Physician's time may include the following activities when performed: preparing to see the patient (eg, review of tests, pre-charting review of records) obtaining and/or reviewing separately obtained history performing a medically appropriate examination and/or evaluation counseling and educating the patient/family/caregiver ordering medications, tests, or procedures referring and communicating with other health care professionals (when not separately reported) documenting clinical information in the electronic or other health record independently interpreting results (not separately reported) and communicating results to the patient/ family/caregiver care coordination (not separately reported)  Note by: Gaspar Cola, MD Date: 07/17/2022; Time: 7:38 AM

## 2022-07-16 NOTE — Patient Instructions (Incomplete)
Naloxone Nasal Spray What is this medication? NALOXONE (nal OX one) treats opioid overdose, which causes slow or shallow breathing, severe drowsiness, or trouble staying awake. Call emergency services after using this medication. You may need additional treatment. Naloxone works by reversing the effects of opioids. It belongs to a group of medications called opioid blockers. This medicine may be used for other purposes; ask your health care provider or pharmacist if you have questions. COMMON BRAND NAME(S): Kloxxado, Narcan What should I tell my care team before I take this medication? They need to know if you have any of these conditions: Heart disease Substance use disorder An unusual or allergic reaction to naloxone, other medications, foods, dyes, or preservatives Pregnant or trying to get pregnant Breast-feeding How should I use this medication? This medication is for use in the nose. Lay the person on their back. Support their neck with your hand and allow the head to tilt back before giving the medication. The nasal spray should be given into 1 nostril. After giving the medication, move the person onto their side. Do not remove or test the nasal spray until ready to use. Get emergency medical help right away after giving the first dose of this medication, even if the person wakes up. You should be familiar with how to recognize the signs and symptoms of a narcotic overdose. If more doses are needed, give the additional dose in the other nostril. Talk to your care team about the use of this medication in children. While this medication may be prescribed for children as young as newborns for selected conditions, precautions do apply. Overdosage: If you think you have taken too much of this medicine contact a poison control center or emergency room at once. NOTE: This medicine is only for you. Do not share this medicine with others. What if I miss a dose? This does not apply. What may  interact with this medication? This is only used during an emergency. No interactions are expected during emergency use. This list may not describe all possible interactions. Give your health care provider a list of all the medicines, herbs, non-prescription drugs, or dietary supplements you use. Also tell them if you smoke, drink alcohol, or use illegal drugs. Some items may interact with your medicine. What should I watch for while using this medication? Keep this medication ready for use in the case of an opioid overdose. Make sure that you have the phone number of your care team and local hospital ready. You may need to have additional doses of this medication. Each nasal spray contains a single dose. Some emergencies may require additional doses. After use, bring the treated person to the nearest hospital or call 911. Make sure the treating care team knows that the person has received a dose of this medication. You will receive additional instructions on what to do during and after use of this medication before an emergency occurs. What side effects may I notice from receiving this medication? Side effects that you should report to your care team as soon as possible: Allergic reactions--skin rash, itching, hives, swelling of the face, lips, tongue, or throat Side effects that usually do not require medical attention (report these to your care team if they continue or are bothersome): Constipation Dryness or irritation inside the nose Headache Increase in blood pressure Muscle spasms Stuffy nose Toothache This list may not describe all possible side effects. Call your doctor for medical advice about side effects. You may report side effects to FDA  at 1-800-FDA-1088. Where should I keep my medication? Keep out of the reach of children and pets. Store between 20 and 25 degrees C (68 and 77 degrees F). Do not freeze. Throw away any unused medication after the expiration date. Keep in original  box until ready to use. NOTE: This sheet is a summary. It may not cover all possible information. If you have questions about this medicine, talk to your doctor, pharmacist, or health care provider.  2023 Elsevier/Gold Standard (2021-08-15 00:00:00) ____________________________________________________________________________________________  Medication Rules  Purpose: To inform patients, and their family members, of our rules and regulations.  Applies to: All patients receiving prescriptions (written or electronic).  Pharmacy of record: Pharmacy where electronic prescriptions will be sent. If written prescriptions are taken to a different pharmacy, please inform the nursing staff. The pharmacy listed in the electronic medical record should be the one where you would like electronic prescriptions to be sent.  Electronic prescriptions: In compliance with the [facility] Strengthen Opioid Misuse Prevention (STOP) Act of 2017 (Session Conni Elliot [phone]), effective October 02, 2018, all controlled substances must be electronically prescribed. Calling prescriptions to the pharmacy will cease to exist.  Prescription refills: Only during scheduled appointments. Applies to all prescriptions.  NOTE: The following applies primarily to controlled substances (Opioid* Pain Medications).   Type of encounter (visit): For patients receiving controlled substances, face-to-face visits are required. (Not an option or up to the patient.)  Patient's responsibilities: Pain Pills: Bring all pain pills to every appointment (except for procedure appointments). Pill Bottles: Bring pills in original pharmacy bottle. Always bring the newest bottle. Bring bottle, even if empty. Medication refills: You are responsible for knowing and keeping track of what medications you take and those you need refilled. The day before your appointment: write a list of all prescriptions that need to be refilled. The day of the  appointment: give the list to the admitting nurse. Prescriptions will be written only during appointments. No prescriptions will be written on procedure days. If you forget a medication: it will not be "Called in", "Faxed", or "electronically sent". You will need to get another appointment to get these prescribed. No early refills. Do not call asking to have your prescription filled early. Prescription Accuracy: You are responsible for carefully inspecting your prescriptions before leaving our office. Have the discharge nurse carefully go over each prescription with you, before taking them home. Make sure that your name is accurately spelled, that your address is correct. Check the name and dose of your medication to make sure it is accurate. Check the number of pills, and the written instructions to make sure they are clear and accurate. Make sure that you are given enough medication to last until your next medication refill appointment. Taking Medication: Take medication as prescribed. When it comes to controlled substances, taking less pills or less frequently than prescribed is permitted and encouraged. Never take more pills than instructed. Never take medication more frequently than prescribed.  Inform other Doctors: Always inform, all of your healthcare providers, of all the medications you take. Pain Medication from other Providers: You are not allowed to accept any additional pain medication from any other Doctor or Healthcare provider. There are two exceptions to this rule. (see below) In the event that you require additional pain medication, you are responsible for notifying us, as stated below. Cough Medicine: Often these contain an opioid, such as codeine or hydrocodone. Never accept or take cough medicine containing these opioids if you are already taking  an opioid* medication. The combination may cause respiratory failure and death. Medication Agreement: You are responsible for carefully  reading and following our Medication Agreement. This must be signed before receiving any prescriptions from our practice. Safely store a copy of your signed Agreement. Violations to the Agreement will result in no further prescriptions. (Additional copies of our Medication Agreement are available upon request.) Laws, Rules, & Regulations: All patients are expected to follow all [address] and [employer], Rules, [employer]. Ignorance of the Laws does not constitute a valid excuse.  Illegal drugs and Controlled Substances: The use of illegal substances (including, but not limited to marijuana and its derivatives) and/or the illegal use of any controlled substances is strictly prohibited. Violation of this rule may result in the immediate and permanent discontinuation of any and all prescriptions being written by our practice. The use of any illegal substances is prohibited. Adopted CDC guidelines & recommendations: Target dosing levels will be at or below 60 MME/day. Use of benzodiazepines** is not recommended.  Exceptions: There are only two exceptions to the rule of not receiving pain medications from other Healthcare Providers. Exception #1 (Emergencies): In the event of an emergency (i.e.: accident requiring emergency care), you are allowed to receive additional pain medication. However, you are responsible for: As soon as you are able, call our office [phone], at any time of the day or night, and leave a message stating your name, the date and nature of the emergency, and the name and dose of the medication prescribed. In the event that your call is answered by a member of our staff, make sure to document and save the date, time, and the name of the person that took your information.  Exception #2 (Planned Surgery): In the event that you are scheduled by another doctor or dentist to have any type of surgery or procedure, you are allowed (for a period no longer than 30 days), to receive  additional pain medication, for the acute post-op pain. However, in this case, you are responsible for picking up a copy of our "Post-op Pain Management for Surgeons" handout, and giving it to your surgeon or dentist. This document is available at our office, and does not require an appointment to obtain it. Simply go to our office during business hours (Monday-Thursday from 8:00 AM to 4:00 PM) (Friday 8:00 AM to 12:00 Noon) or if you have a scheduled appointment with [place], prior to your surgery, and ask for it by name. In addition, you are responsible for: calling our office [phone], at any time of the day or night, and leaving a message stating your name, name of your surgeon, type of surgery, and date of procedure or surgery. Failure to comply with your responsibilities may result in termination of therapy involving the controlled substances. Medication Agreement Violation. Following the above rules, including your responsibilities will help you in avoiding a Medication Agreement Violation ("Breaking your Pain Medication Contract").  *Opioid medications include: morphine, codeine, oxycodone, oxymorphone, hydrocodone, hydromorphone, meperidine, tramadol, tapentadol, buprenorphine, fentanyl, methadone. **Benzodiazepine medications include: diazepam (Valium), alprazolam (Xanax), clonazepam (Klonopine), lorazepam (Ativan), clorazepate (Tranxene), chlordiazepoxide (Librium), estazolam (Prosom), oxazepam (Serax), temazepam (Restoril), triazolam (Halcion) (Last updated: 06/29/2021) ____________________________________________________________________________________________  ____________________________________________________________________________________________  Medication Recommendations and Reminders  Applies to: All patients receiving prescriptions (written and/or electronic).  Medication Rules & Regulations: These rules and regulations exist for your safety and that of others. They are not  flexible and neither are we. Dismissing or ignoring them will be considered "non-compliance"  with medication therapy, resulting in complete and irreversible termination of such therapy. (See document titled "Medication Rules" for more details.) In all conscience, because of safety reasons, we cannot continue providing a therapy where the patient does not follow instructions.  Pharmacy of record:  Definition: This is the pharmacy where your electronic prescriptions will be sent.  We do not endorse any particular pharmacy, however, we have experienced problems with [facility] not securing enough medication supply for the community. We do not restrict you in your choice of pharmacy. However, once we write for your prescriptions, we will NOT be re-sending more prescriptions to fix restricted supply problems created by your pharmacy, or your insurance.  The pharmacy listed in the electronic medical record should be the one where you want electronic prescriptions to be sent. If you choose to change pharmacy, simply notify our nursing staff.  Recommendations: Keep all of your pain medications in a safe place, under lock and key, even if you live alone. We will NOT replace lost, stolen, or damaged medication. After you fill your prescription, take 1 week's worth of pills and put them away in a safe place. You should keep a separate, properly labeled bottle for this purpose. The remainder should be kept in the original bottle. Use this as your primary supply, until it runs out. Once it's gone, then you know that you have 1 week's worth of medicine, and it is time to come in for a prescription refill. If you do this correctly, it is unlikely that you will ever run out of medicine. To make sure that the above recommendation works, it is very important that you make sure your medication refill appointments are scheduled at least 1 week before you run out of medicine. To do this in an effective manner, make sure that  you do not leave the office without scheduling your next medication management appointment. Always ask the nursing staff to show you in your prescription , when your medication will be running out. Then arrange for the receptionist to get you a return appointment, at least 7 days before you run out of medicine. Do not wait until you have 1 or 2 pills left, to come in. This is very poor planning and does not take into consideration that we may need to cancel appointments due to bad weather, sickness, or emergencies affecting our staff. DO NOT ACCEPT A "Partial Fill": If for any reason your pharmacy does not have enough pills/tablets to completely fill or refill your prescription, do not allow for a "partial fill". The law allows the pharmacy to complete that prescription within 72 hours, without requiring a new prescription. If they do not fill the rest of your prescription within those 72 hours, you will need a separate prescription to fill the remaining amount, which we will NOT provide. If the reason for the partial fill is your insurance, you will need to talk to the pharmacist about payment alternatives for the remaining tablets, but again, DO NOT ACCEPT A PARTIAL FILL, unless you can trust your pharmacist to obtain the remainder of the pills within 72 hours.  Prescription refills and/or changes in medication(s):  Prescription refills, and/or changes in dose or medication, will be conducted only during scheduled medication management appointments. (Applies to both, written and electronic prescriptions.) No refills on procedure days. No medication will be changed or started on procedure days. No changes, adjustments, and/or refills will be conducted on a procedure day. Doing so will interfere with the diagnostic portion  of the procedure. No phone refills. No medications will be "called into the pharmacy". No Fax refills. No weekend refills. No Holliday refills. No after hours refills.  Remember:   Business hours are:  Monday to Thursday 8:00 AM to 4:00 PM Provider's Schedule: Delano Metz, MD - Appointments are:  Medication management: Monday and Wednesday 8:00 AM to 4:00 PM Procedure day: Tuesday and Thursday 7:30 AM to 4:00 PM Edward Jolly, MD - Appointments are:  Medication management: Tuesday and Thursday 8:00 AM to 4:00 PM Procedure day: Monday and Wednesday 7:30 AM to 4:00 PM (Last update: 04/21/2020) ____________________________________________________________________________________________  ____________________________________________________________________________________________  Pharmacy Shortages of Pain Medication   Introduction Shockingly as it may seem, .  "No U.[place] Supreme Court decision has ever interpreted the Constitution as guaranteeing a right to health care for all Americans." - OpeningJokes.ch  "With respect to human rights, the [place] has no formally codified right to health, nor does it participate in a human rights treaty that specifies a right to health." - Scott J. Schweikart, JD, MBE  Situation By now, most of our patients have had the experience of being told by their pharmacist that they do not have enough medication to cover their prescription. If you have not had this experience, just know that you soon will.  Problem There appears to be a shortage of these medications, either at the national level or locally. This is happening with all pharmacies. When there is not enough medication, patients are offered a partial fill and they are told that they will try to get the rest of the medicine for them at a later time. If they do not have enough for even a partial fill, the pharmacists are telling the patients to call us (the prescribing physicians) to request that we send another prescription to another pharmacy to get the medicine.   This reordering of a controlled  substance creates documentation problems where additional paperwork needs to be created to explain why two prescriptions for the same period of time and the same medicine are being prescribed to the same patient. It also creates situations where the last appointment note does not accurately reflect when and what prescriptions were given to a patient. This leads to prescribing errors down the line, in subsequent follow-up visits.   [employer] of Pharmacy ([employer]) Research revealed that Educational psychologist .1806 (21 NCAC 46.1806) authorizes pharmacists to the transfer of prescriptions among pharmacies, and it sets forth procedural and recordkeeping requirements for doing so. However, this requires the pharmacist to complete the previously mentioned procedural paperwork to accomplish the transfer. As it turns out, it is much easier for them to have the prescribing physicians do the work.   Possible solutions 1. You can ask your physician to assist you in weaning yourself off these medications. 2. Ask your pharmacy if the medication is in stock, 3 days prior to your refill. 3. If you need a pharmacy change, let us know at your medication management visit. Prescriptions that have already been electronically sent to a pharmacy will not be re-sent to a different pharmacy if your pharmacy of record does not have it in stock. Proper stocking of medication is a pharmacy problem, not a prescriber problem. Work with your pharmacist to solve the problem. 4. Have the [facility] Assembly add a provision to the "STOP ACT" (the law that mandates how controlled substances are prescribed) where there is an exception to the electronic prescribing rule that states that in  the event there are shortages of medications the physicians are allowed to use written prescriptions as opposed to electronic ones. This would allow patients to take their prescriptions to a different pharmacy that may have enough  medication available to fill the prescription. The problem is that currently there is a law that does not allow for written prescriptions, with the exception of instances where the electronic medical record is down due to technical issues.  5. Have [place] Congress ease the pressure on pharmaceutical companies, allowing them to produce enough quantities of the medication to adequately supply the population. 6. Have pharmacies keep enough stocks of these medications to cover their client base.  7. Have the [employer] [facility] [employer] [facility] Assembly add a provision to the "STOP ACT" where they ease the regulations surrounding the transfer of controlled substances between pharmacies, so as to simplify the transfer of supplies. As an alternative, develop a system to allow patients to obtain the remainder of their prescription at another one of their pharmacies or at an associate pharmacy.   How this shortage will affect you.  Understand that this is a pharmacy supply problem, not a prescriber problem. Work with your pharmacy to solve it. The job of the prescriber is to evaluate and monitor the patient for the appropriate indications and use of these medicines. It is not the job of the prescriber to supply the medication or to solve problems with that supply. The responsibility and the choice to obtain the medication resides on the patient. By law, supplying the medication is the job of the pharmacy. It is certainly not the job of the prescriber to solve supply problems.   Due to the above problems we are no longer taking patients to write for their pain medication. Future discussions with your physician may include potentially weaning medications or transitioning to alternatives.  We will be focusing primarily on interventional based pain management. We will continue to evaluate for appropriate indications and we may provide recommendations regarding medication, dose, and schedule, as well as monitoring recommendations, however,  we will not be taking over the actual prescribing of these substances. On those patients where we are treating their chronic pain with interventional therapies, exceptions will be considered on a case by case basis. At this time, we will try to continue providing this supplemental service to those patients we have been managing in the past. However, as of August 1st, 2023, we no longer will be sending additional prescriptions to other pharmacies for the purpose of solving their supply problems. Once we send a prescription to a pharmacy, we will not be resending it again to another pharmacy to cover for their shortages.   What to do. Write as many letters as you can. Recruit the help of family members in writing these letters. Below are some of the places where you can write to make your voice heard. Let them know what the problem is and push them to look for solutions.   Search internet for: "[employer] find your legislators" iixl.com  Search internet for: "[employer] commissioner complaints" RareVoices.pl  Search internet for: "[employer] of Pharmacy complaints" MobLag.com.cy.htm  Search internet for: "[facility] pharmacy complaints" Email [facility] Customer Relations https://www.graham-perkins.biz/.jsp?callType=store  Search internet for: Architectural technologist customer service complaints" https://www.[facility].com/topic/marketing/contactus/contactus_customerservice.jsp  ____________________________________________________________________________________________  ____________________________________________________________________________________________  Drug Holidays (Slow)  What is a "Drug Holiday"? Drug Holiday: is the name given to the period of time during which a patient stops taking a medication(s) for the purpose of eliminating  tolerance  to the drug.  Benefits Improved effectiveness of opioids. Decreased opioid dose needed to achieve benefits. Improved pain with lesser dose.  What is tolerance? Tolerance: is the progressive decreased in effectiveness of a drug due to its repetitive use. With repetitive use, the body gets use to the medication and as a consequence, it loses its effectiveness. This is a common problem seen with opioid pain medications. As a result, a larger dose of the drug is needed to achieve the same effect that used to be obtained with a smaller dose.  How long should a "Drug Holiday" last? You should stay off of the pain medicine for at least 14 consecutive days. (2 weeks)  Should I stop the medicine "cold [place]"? No. You should always coordinate with your Pain Specialist so that he/she can provide you with the correct medication dose to make the transition as smoothly as possible.  How do I stop the medicine? Slowly. You will be instructed to decrease the daily amount of pills that you take by one (1) pill every seven (7) days. This is called a "slow downward taper" of your dose. For example: if you normally take four (4) pills per day, you will be asked to drop this dose to three (3) pills per day for seven (7) days, then to two (2) pills per day for seven (7) days, then to one (1) per day for seven (7) days, and at the end of those last seven (7) days, this is when the "Drug Holiday" would start.   Will I have withdrawals? By doing a "slow downward taper" like this one, it is unlikely that you will experience any significant withdrawal symptoms. Typically, what triggers withdrawals is the sudden stop of a high dose opioid therapy. Withdrawals can usually be avoided by slowly decreasing the dose over a prolonged period of time. If you do not follow these instructions and decide to stop your medication abruptly, withdrawals may be possible.  What are withdrawals? Withdrawals: refers to the wide range of  symptoms that occur after stopping or dramatically reducing opiate drugs after heavy and prolonged use. Withdrawal symptoms do not occur to patients that use low dose opioids, or those who take the medication sporadically. Contrary to benzodiazepine (example: Valium, Xanax, etc.) or alcohol withdrawals ("Delirium Tremens"), opioid withdrawals are not lethal. Withdrawals are the physical manifestation of the body getting rid of the excess receptors.  Expected Symptoms Early symptoms of withdrawal may include: Agitation Anxiety Muscle aches Increased tearing Insomnia Runny nose Sweating Yawning  Late symptoms of withdrawal may include: Abdominal cramping Diarrhea Dilated pupils Goose bumps Nausea Vomiting  Will I experience withdrawals? Due to the slow nature of the taper, it is very unlikely that you will experience any.  What is a slow taper? Taper: refers to the gradual decrease in dose.  (Last update: 04/21/2020) ____________________________________________________________________________________________   ____________________________________________________________________________________________  CBD (cannabidiol) & Delta-8 (Delta-8 tetrahydrocannabinol) WARNING  Intro: Cannabidiol (CBD) and tetrahydrocannabinol (THC), are two natural compounds found in plants of the Cannabis genus. They can both be extracted from hemp or cannabis. Hemp and cannabis come from the Cannabis sativa plant. Both compounds interact with your body's endocannabinoid system, but they have very different effects. CBD does not produce the high sensation associated with cannabis. Delta-8 tetrahydrocannabinol, also known as delta-8 THC, is a psychoactive substance found in the Cannabis sativa plant, of which marijuana and hemp are two varieties. THC is responsible for the high associated with the illicit use of marijuana.  Applicable to: All individuals currently taking or considering taking CBD  (cannabidiol) and, more important, all patients taking opioid analgesic controlled substances (pain medication). (Example: oxycodone; oxymorphone; hydrocodone; hydromorphone; morphine; methadone; tramadol; tapentadol; fentanyl; buprenorphine; butorphanol; dextromethorphan; meperidine; codeine; etc.)  Legal status: CBD remains a Schedule I drug prohibited for any use. CBD is illegal with one exception. In the [place], CBD has a limited Education officer, environmental (FDA) approval for the treatment of two specific types of epilepsy disorders. Only one CBD product has been approved by the FDA for this purpose: "Epidiolex". FDA is aware that some companies are marketing products containing cannabis and cannabis-derived compounds in ways that violate the [employer], Drug and Cosmetic Act [facility] Act) and that may put the health and safety of consumers at risk. The FDA, a Federal agency, has not enforced the CBD status since 2018. UPDATE: (11/18/2021) The Drug Enforcement Agency (DEA) issued a letter stating that "delta" cannabinoids, including Delta-8-THCO and Delta-9-THCO, synthetically derived from hemp do not qualify as hemp and will be viewed as Schedule I drugs. (Schedule I drugs, substances, or chemicals are defined as drugs with no currently accepted medical use and a high potential for abuse. Some examples of Schedule I drugs are: heroin, lysergic acid diethylamide (LSD), marijuana (cannabis), 3,4-methylenedioxymethamphetamine (ecstasy), methaqualone, and peyote.) ([employer])  Legality: Some manufacturers ship CBD products nationally, which is illegal. Often such products are sold online and are therefore available throughout the country. CBD is openly sold in head shops and health food stores in some states where such sales have not been explicitly legalized. Selling unapproved products with unsubstantiated therapeutic claims is not only a violation of the law, but also can put patients at  risk, as these products have not been proven to be safe or effective. Federal illegality makes it difficult to conduct research on CBD.  Reference: "FDA Regulation of Cannabis and Cannabis-Derived Products, Including Cannabidiol (CBD)" - [employer]  Warning: CBD is not FDA approved and has not undergo the same manufacturing controls as prescription drugs.  This means that the purity and safety of available CBD may be questionable. Most of the time, despite manufacturer's claims, it is contaminated with THC (delta-9-tetrahydrocannabinol - the chemical in marijuana responsible for the "HIGH").  When this is the case, the [facility] contaminant will trigger a positive urine drug screen (UDS) test for Marijuana (carboxy-THC). Because a positive UDS for any illicit substance is a violation of our medication agreement, your opioid analgesics (pain medicine) may be permanently discontinued. The FDA recently put out a warning about 5 things that everyone should be aware of regarding Delta-8 THC: Delta-8 THC products have not been evaluated or approved by the FDA for safe use and may be marketed in ways that put the public health at risk. The FDA has received adverse event reports involving delta-8 THC-containing products. Delta-8 THC has psychoactive and intoxicating effects. Delta-8 THC manufacturing often involve use of potentially harmful chemicals to create the concentrations of delta-8 THC claimed in the marketplace. The final delta-8 THC product may have potentially harmful by-products (contaminants) due to the chemicals used in the process. Manufacturing of delta-8 THC products may occur in uncontrolled or unsanitary settings, which may lead to the presence of unsafe contaminants or other potentially harmful substances. Delta-8 THC products should be kept out of the reach of children and  pets.  MORE ABOUT CBD  General Information: CBD was discovered in [birth year] and it is a derivative of the cannabis sativa genus plants (Marijuana  and Hemp). It is one of the 113 identified substances found in Marijuana. It accounts for up to 40% of the plant's extract. As of 2018, preliminary clinical studies on CBD included research for the treatment of anxiety, movement disorders, and pain. CBD is available and consumed in multiple forms, including inhalation of smoke or vapor, as an aerosol spray, and by mouth. It may be supplied as an oil containing CBD, capsules, dried cannabis, or as a liquid solution. CBD is thought not to be as psychoactive as THC (delta-9-tetrahydrocannabinol - the chemical in marijuana responsible for the "HIGH"). Studies suggest that CBD may interact with different biological target receptors in the body, including cannabinoid and other neurotransmitter receptors. As of 2018 the mechanism of action for its biological effects has not been determined.  Side-effects  Adverse reactions: Dry mouth, diarrhea, decreased appetite, fatigue, drowsiness, malaise, weakness, sleep disturbances, and others.  Drug interactions: CBC may interact with other medications such as blood-thinners. Because CBD causes drowsiness on its own, it also increases the drowsiness caused by other medications, including antihistamines (such as Benadryl), benzodiazepines (Xanax, Ativan, Valium), antipsychotics, antidepressants and opioids, as well as alcohol and supplements such as kava, melatonin and St. John's Wort. Be cautious with the following combinations:   Brivaracetam (Briviact) Brivaracetam is changed and broken down by the body. CBD might decrease how quickly the body breaks down brivaracetam. This might increase levels of brivaracetam in the body.  Caffeine Caffeine is changed and broken down by the body. CBD might decrease how quickly the body breaks down caffeine. This might increase levels of  caffeine in the body.  Carbamazepine (Tegretol) Carbamazepine is changed and broken down by the body. CBD might decrease how quickly the body breaks down carbamazepine. This might increase levels of carbamazepine in the body and increase its side effects.  Citalopram (Celexa) Citalopram is changed and broken down by the body. CBD might decrease how quickly the body breaks down citalopram. This might increase levels of citalopram in the body and increase its side effects.  Clobazam (Onfi) Clobazam is changed and broken down by the liver. CBD might decrease how quickly the liver breaks down clobazam. This might increase the effects and side effects of clobazam.  Eslicarbazepine (Aptiom) Eslicarbazepine is changed and broken down by the body. CBD might decrease how quickly the body breaks down eslicarbazepine. This might increase levels of eslicarbazepine in the body by a small amount.  Everolimus (Zostress) Everolimus is changed and broken down by the body. CBD might decrease how quickly the body breaks down everolimus. This might increase levels of everolimus in the body.  Lithium Taking higher doses of CBD might increase levels of lithium. This can increase the risk of lithium toxicity.  Medications changed by the liver (Cytochrome P450 1A1 (CYP1A1) substrates) Some medications are changed and broken down by the liver. CBD might change how quickly the liver breaks down these medications. This could change the effects and side effects of these medications.  Medications changed by the liver (Cytochrome P450 1A2 (CYP1A2) substrates) Some medications are changed and broken down by the liver. CBD might change how quickly the liver breaks down these medications. This could change the effects and side effects of these medications.  Medications changed by the liver (Cytochrome P450 1B1 (CYP1B1) substrates) Some medications are changed and broken down by the liver. CBD might change how quickly the  liver breaks down these medications. This could change the effects and side effects of these medications.  Medications changed  by the liver (Cytochrome P450 2A6 (CYP2A6) substrates) Some medications are changed and broken down by the liver. CBD might change how quickly the liver breaks down these medications. This could change the effects and side effects of these medications.  Medications changed by the liver (Cytochrome P450 2B6 (CYP2B6) substrates) Some medications are changed and broken down by the liver. CBD might change how quickly the liver breaks down these medications. This could change the effects and side effects of these medications.  Medications changed by the liver (Cytochrome P450 2C19 (CYP2C19) substrates) Some medications are changed and broken down by the liver. CBD might change how quickly the liver breaks down these medications. This could change the effects and side effects of these medications.  Medications changed by the liver (Cytochrome P450 2C8 (CYP2C8) substrates) Some medications are changed and broken down by the liver. CBD might change how quickly the liver breaks down these medications. This could change the effects and side effects of these medications.  Medications changed by the liver (Cytochrome P450 2C9 (CYP2C9) substrates) Some medications are changed and broken down by the liver. CBD might change how quickly the liver breaks down these medications. This could change the effects and side effects of these medications.  Medications changed by the liver (Cytochrome P450 2D6 (CYP2D6) substrates) Some medications are changed and broken down by the liver. CBD might change how quickly the liver breaks down these medications. This could change the effects and side effects of these medications.  Medications changed by the liver (Cytochrome P450 2E1 (CYP2E1) substrates) Some medications are changed and broken down by the liver. CBD might change how quickly the liver  breaks down these medications. This could change the effects and side effects of these medications.  Medications changed by the liver (Cytochrome P450 3A4 (CYP3A4) substrates) Some medications are changed and broken down by the liver. CBD might change how quickly the liver breaks down these medications. This could change the effects and side effects of these medications.  Medications changed by the liver (Glucuronidated drugs) Some medications are changed and broken down by the liver. CBD might change how quickly the liver breaks down these medications. This could change the effects and side effects of these medications.  Medications that decrease the breakdown of other medications by the liver (Cytochrome P450 2C19 (CYP2C19) inhibitors) CBD is changed and broken down by the liver. Some drugs decrease how quickly the liver changes and breaks down CBD. This could change the effects and side effects of CBD.  Medications that decrease the breakdown of other medications in the liver (Cytochrome P450 3A4 (CYP3A4) inhibitors) CBD is changed and broken down by the liver. Some drugs decrease how quickly the liver changes and breaks down CBD. This could change the effects and side effects of CBD.  Medications that increase breakdown of other medications by the liver (Cytochrome P450 3A4 (CYP3A4) inducers) CBD is changed and broken down by the liver. Some drugs increase how quickly the liver changes and breaks down CBD. This could change the effects and side effects of CBD.  Medications that increase the breakdown of other medications by the liver (Cytochrome P450 2C19 (CYP2C19) inducers) CBD is changed and broken down by the liver. Some drugs increase how quickly the liver changes and breaks down CBD. This could change the effects and side effects of CBD.  Methadone (Dolophine) Methadone is broken down by the liver. CBD might decrease how quickly the liver breaks down methadone. Taking cannabidiol along  with methadone might  increase the effects and side effects of methadone.  Rufinamide (Banzel) Rufinamide is changed and broken down by the body. CBD might decrease how quickly the body breaks down rufinamide. This might increase levels of rufinamide in the body by a small amount.  Sedative medications (CNS depressants) CBD might cause sleepiness and slowed breathing. Some medications, called sedatives, can also cause sleepiness and slowed breathing. Taking CBD with sedative medications might cause breathing problems and/or too much sleepiness.  Sirolimus (Rapamune) Sirolimus is changed and broken down by the body. CBD might decrease how quickly the body breaks down sirolimus. This might increase levels of sirolimus in the body.  Stiripentol (Diacomit) Stiripentol is changed and broken down by the body. CBD might decrease how quickly the body breaks down stiripentol. This might increase levels of stiripentol in the body and increase its side effects.  Tacrolimus (Prograf) Tacrolimus is changed and broken down by the body. CBD might decrease how quickly the body breaks down tacrolimus. This might increase levels of tacrolimus in the body.  Tamoxifen (Soltamox) Tamoxifen is changed and broken down by the body. CBD might affect how quickly the body breaks down tamoxifen. This might affect levels of tamoxifen in the body.  Topiramate (Topamax) Topiramate is changed and broken down by the body. CBD might decrease how quickly the body breaks down topiramate. This might increase levels of topiramate in the body by a small amount.  Valproate Valproic acid can cause liver injury. Taking cannabidiol with valproic acid might increase the chance of liver injury. CBD and/or valproic acid might need to be stopped, or the dose might need to be reduced.  Warfarin (Coumadin) CBD might increase levels of warfarin, which can increase the risk for bleeding. CBD and/or warfarin might need to be stopped, or the  dose might need to be reduced.  Zonisamide Zonisamide is changed and broken down by the body. CBD might decrease how quickly the body breaks down zonisamide. This might increase levels of zonisamide in the body by a small amount. (Last update: 11/30/2021) ____________________________________________________________________________________________ ______________________________________________________________________  Preparing for Procedure with Sedation  NOTICE: Due to recent regulatory changes, starting on May 02, 2021, procedures requiring intravenous (IV) sedation will no longer be performed at the [facility].  These types of procedures are required to be performed at [facility] ambulatory surgery facility.  We are very sorry for the inconvenience.  Procedure appointments are limited to planned procedures: No Prescription Refills. No disability issues will be discussed. No medication changes will be discussed.  Instructions: Oral Intake: Do not eat or drink anything for at least 8 hours prior to your procedure. (Exception: Blood Pressure Medication. See below.) Transportation: A driver is required. You may not drive yourself after the procedure. Blood Pressure Medicine: Do not forget to take your blood pressure medicine with a sip of water the morning of the procedure. If your Diastolic (lower reading) is above 100 mmHg, elective cases will be cancelled/rescheduled. Blood thinners: These will need to be stopped for procedures. Notify our staff if you are taking any blood thinners. Depending on which one you take, there will be specific instructions on how and when to stop it. Diabetics on insulin: Notify the staff so that you can be scheduled 1st case in the morning. If your diabetes requires high dose insulin, take only  of your normal insulin dose the morning of the procedure and notify the staff that you have done so. Preventing infections: Shower with an antibacterial soap the  morning of  your procedure. Build-up your immune system: Take 1000 mg of Vitamin C with every meal (3 times a day) the day prior to your procedure. Antibiotics: Inform the staff if you have a condition or reason that requires you to take antibiotics before dental procedures. Pregnancy: If you are pregnant, call and cancel the procedure. Sickness: If you have a cold, fever, or any active infections, call and cancel the procedure. Arrival: You must be in the facility at least 30 minutes prior to your scheduled procedure. Children: Do not bring children with you. Dress appropriately: There is always the possibility that your clothing may get soiled. Valuables: Do not bring any jewelry or valuables.  Reasons to call and reschedule or cancel your procedure: (Following these recommendations will minimize the risk of a serious complication.) Surgeries: Avoid having procedures within 2 weeks of any surgery. (Avoid for 2 weeks before or after any surgery). Flu Shots: Avoid having procedures within 2 weeks of a flu shots. (Avoid for 2 weeks before or after immunizations). Barium: Avoid having a procedure within 7-10 days after having had a radiological study involving the use of radiological contrast. (Myelograms, Barium swallow or enema study). Heart attacks: Avoid any elective procedures or surgeries for the initial 6 months after a "Myocardial Infarction" (Heart Attack). Blood thinners: It is imperative that you stop these medications before procedures. Let us know if you if you take any blood thinner.  Infection: Avoid procedures during or within two weeks of an infection (including chest colds or gastrointestinal problems). Symptoms associated with infections include: Localized redness, fever, chills, night sweats or profuse sweating, burning sensation when voiding, cough, congestion, stuffiness, runny nose, sore throat, diarrhea, nausea, vomiting, cold or Flu symptoms, recent or current infections. It is  specially important if the infection is over the area that we intend to treat. Heart and lung problems: Symptoms that may suggest an active cardiopulmonary problem include: cough, chest pain, breathing difficulties or shortness of breath, dizziness, ankle swelling, uncontrolled high or unusually low blood pressure, and/or palpitations. If you are experiencing any of these symptoms, cancel your procedure and contact your primary care physician for an evaluation.  Remember:  Regular Business hours are:  Monday to Thursday 8:00 AM to 4:00 PM  Provider's Schedule: Delano Metz, MD:  Procedure days: Tuesday and Thursday 7:30 AM to 4:00 PM  Edward Jolly, MD:  Procedure days: Monday and Wednesday 7:30 AM to 4:00 PM ______________________________________________________________________ ____________________________________________________________________________________________  Muscle Spasms & Cramps  Cause(s):  Most common - vitamin and/or electrolyte (calcium, potassium, sodium, etc.) deficiencies. Post procedure - steroids can make your kidneys excrete electrolytes. If you happen to have been borderline low on your electrolytes, it may temporarily triggering cramps & spasms.  Possible triggers: Sweating - causes loss of electrolytes thru the skin. Steroids - causes loss of electrolytes thru the urine.  Treatment: Gatorade (or any other electrolyte-replenishing drink) - Take 1, 8 oz glass with each meal (3 times a day). OTC (over-the-counter) Magnesium 400 to 500 mg - Take 1 tablet twice a day (one with breakfast and one before bedtime). If you have kidney problems, talk to your primary care physician before taking any Magnesium. Tonic Water with quinine - Take 1, 8 oz glass before bedtime.   ____________________________________________________________________________________________ ____________________________________________________________________________________________  General  Risks and Possible Complications  Patient Responsibilities: It is important that you read this as it is part of your informed consent. It is our duty to inform you of the risks and possible complications associated with treatments  offered to you. It is your responsibility as a patient to read this and to ask questions about anything that is not clear or that you believe was not covered in this document.  Patient's Rights: You have the right to refuse treatment. You also have the right to change your mind, even after initially having agreed to have the treatment done. However, under this last option, if you wait until the last second to change your mind, you may be charged for the materials used up to that point.  Introduction: Medicine is not an Visual merchandiser. Everything in Medicine, including the lack of treatment(s), carries the potential for danger, harm, or loss (which is by definition: Risk). In Medicine, a complication is a secondary problem, condition, or disease that can aggravate an already existing one. All treatments carry the risk of possible complications. The fact that a side effects or complications occurs, does not imply that the treatment was conducted incorrectly. It must be clearly understood that these can happen even when everything is done following the highest safety standards.  No treatment: You can choose not to proceed with the proposed treatment alternative. The "PRO(s)" would include: avoiding the risk of complications associated with the therapy. The "CON(s)" would include: not getting any of the treatment benefits. These benefits fall under one of three categories: diagnostic; therapeutic; and/or palliative. Diagnostic benefits include: getting information which can ultimately lead to improvement of the disease or symptom(s). Therapeutic benefits are those associated with the successful treatment of the disease. Finally, palliative benefits are those related to the decrease of  the primary symptoms, without necessarily curing the condition (example: decreasing the pain from a flare-up of a chronic condition, such as incurable terminal cancer).  General Risks and Complications: These are associated to most interventional treatments. They can occur alone, or in combination. They fall under one of the following six (6) categories: no benefit or worsening of symptoms; bleeding; infection; nerve damage; allergic reactions; and/or death. No benefits or worsening of symptoms: In Medicine there are no guarantees, only probabilities. No healthcare provider can ever guarantee that a medical treatment will work, they can only state the probability that it may. Furthermore, there is always the possibility that the condition may worsen, either directly, or indirectly, as a consequence of the treatment. Bleeding: This is more common if the patient is taking a blood thinner, either prescription or over the counter (example: Goody Powders, Fish oil, Aspirin, Garlic, etc.), or if suffering a condition associated with impaired coagulation (example: Hemophilia, cirrhosis of the liver, low platelet counts, etc.). However, even if you do not have one on these, it can still happen. If you have any of these conditions, or take one of these drugs, make sure to notify your treating physician. Infection: This is more common in patients with a compromised immune system, either due to disease (example: diabetes, cancer, human immunodeficiency virus [HIV], etc.), or due to medications or treatments (example: therapies used to treat cancer and rheumatological diseases). However, even if you do not have one on these, it can still happen. If you have any of these conditions, or take one of these drugs, make sure to notify your treating physician. Nerve Damage: This is more common when the treatment is an invasive one, but it can also happen with the use of medications, such as those used in the treatment of cancer.  The damage can occur to small secondary nerves, or to large primary ones, such as those in the spinal cord  and brain. This damage may be temporary or permanent and it may lead to impairments that can range from temporary numbness to permanent paralysis and/or brain death. Allergic Reactions: Any time a substance or material comes in contact with our body, there is the possibility of an allergic reaction. These can range from a mild skin rash (contact dermatitis) to a severe systemic reaction (anaphylactic reaction), which can result in death. Death: In general, any medical intervention can result in death, most of the time due to an unforeseen complication. ____________________________________________________________________________________________    ____________________________________________________________________________________________  Blood Thinners  IMPORTANT NOTICE:  If you take any of these, make sure to notify the nursing staff.  Failure to do so may result in injury.  Recommended time intervals to stop and restart blood-thinners, before & after invasive procedures  Generic Name Brand Name Pre-procedure. Stop this long before procedure. Post-procedure. Minimum waiting period before restarting.  Abciximab Reopro 15 days 2 hrs  Alteplase Activase 10 days 10 days  Anagrelide Agrylin    Apixaban Eliquis 3 days 6 hrs  Cilostazol Pletal 3 days 5 hrs  Clopidogrel Plavix 7-10 days 2 hrs  Dabigatran Pradaxa 5 days 6 hrs  Dalteparin Fragmin 24 hours 4 hrs  Dipyridamole Aggrenox 11days 2 hrs  Edoxaban Lixiana; Savaysa 3 days 2 hrs  Enoxaparin  Lovenox 24 hours 4 hrs  Eptifibatide Integrillin 8 hours 2 hrs  Fondaparinux  Arixtra 72 hours 12 hrs  Hydroxychloroquine Plaquenil 11 days   Prasugrel Effient 7-10 days 6 hrs  Reteplase Retavase 10 days 10 days  Rivaroxaban Xarelto 3 days 6 hrs  Ticagrelor Brilinta 5-7 days 6 hrs  Ticlopidine Ticlid 10-14 days 2 hrs  Tinzaparin Innohep 24  hours 4 hrs  Tirofiban Aggrastat 8 hours 2 hrs  Warfarin Coumadin 5 days 2 hrs   Other medications with blood-thinning effects  Product indications Generic (Brand) names Note  Cholesterol Lipitor Stop 4 days before procedure  Blood thinner (injectable) Heparin (LMW or LMWH Heparin) Stop 24 hours before procedure  Cancer Ibrutinib (Imbruvica) Stop 7 days before procedure  Malaria/Rheumatoid Hydroxychloroquine (Plaquenil) Stop 11 days before procedure  Thrombolytics  10 days before or after procedures   Over-the-counter (OTC) Products with blood-thinning effects  Product Common names Stop Time  Aspirin > 325 mg Goody Powders, Excedrin, etc. 11 days  Aspirin ? 81 mg  7 days  Fish oil  4 days  Garlic supplements  7 days  Ginkgo biloba  36 hours  Ginseng  24 hours  NSAIDs Ibuprofen, Naprosyn, etc. 3 days  Vitamin E  4 days   ____________________________________________________________________________________________

## 2022-07-17 ENCOUNTER — Ambulatory Visit: Payer: Medicare Other | Attending: Pain Medicine | Admitting: Pain Medicine

## 2022-07-17 ENCOUNTER — Other Ambulatory Visit (HOSPITAL_COMMUNITY): Payer: Self-pay

## 2022-07-17 ENCOUNTER — Encounter: Payer: Self-pay | Admitting: Pain Medicine

## 2022-07-17 VITALS — BP 143/79 | HR 84 | Temp 97.0°F | Resp 16 | Ht 60.0 in | Wt 187.0 lb

## 2022-07-17 DIAGNOSIS — Z79899 Other long term (current) drug therapy: Secondary | ICD-10-CM | POA: Diagnosis not present

## 2022-07-17 DIAGNOSIS — Z79891 Long term (current) use of opiate analgesic: Secondary | ICD-10-CM | POA: Insufficient documentation

## 2022-07-17 DIAGNOSIS — M79605 Pain in left leg: Secondary | ICD-10-CM | POA: Insufficient documentation

## 2022-07-17 DIAGNOSIS — G894 Chronic pain syndrome: Secondary | ICD-10-CM | POA: Diagnosis not present

## 2022-07-17 DIAGNOSIS — M25551 Pain in right hip: Secondary | ICD-10-CM | POA: Insufficient documentation

## 2022-07-17 DIAGNOSIS — Z7901 Long term (current) use of anticoagulants: Secondary | ICD-10-CM | POA: Diagnosis not present

## 2022-07-17 DIAGNOSIS — G8929 Other chronic pain: Secondary | ICD-10-CM | POA: Diagnosis not present

## 2022-07-17 DIAGNOSIS — M545 Low back pain, unspecified: Secondary | ICD-10-CM | POA: Diagnosis not present

## 2022-07-17 DIAGNOSIS — M542 Cervicalgia: Secondary | ICD-10-CM | POA: Insufficient documentation

## 2022-07-17 DIAGNOSIS — M79604 Pain in right leg: Secondary | ICD-10-CM | POA: Insufficient documentation

## 2022-07-17 DIAGNOSIS — M47816 Spondylosis without myelopathy or radiculopathy, lumbar region: Secondary | ICD-10-CM | POA: Diagnosis not present

## 2022-07-17 MED ORDER — NALOXONE HCL 4 MG/0.1ML NA LIQD: 1.0000 | NASAL | 0 refills | Status: DC | PRN

## 2022-07-17 MED ORDER — HYDROCODONE-ACETAMINOPHEN 5-325 MG PO TABS: 1.0000 | ORAL_TABLET | Freq: Two times a day (BID) | ORAL | 0 refills | Status: DC | PRN

## 2022-07-17 NOTE — Progress Notes (Signed)
Nursing Pain Medication Assessment:  Safety precautions to be maintained throughout the outpatient stay will include: orient to surroundings, keep bed in low position, maintain call bell within reach at all times, provide assistance with transfer out of bed and ambulation.  Medication Inspection Compliance: Pill count conducted under aseptic conditions, in front of the patient. Neither the pills nor the bottle was removed from the patient's sight at any time. Once count was completed pills were immediately returned to the patient in their original bottle.  Medication: Hydrocodone/APAP Pill/Patch Count:  19 of 60 pills remain Pill/Patch Appearance: Markings consistent with prescribed medication Bottle Appearance: Standard pharmacy container. Clearly labeled. Filled Date: 86 / 1 / 2023 Last Medication intake:  Yesterday  Patient states she has 12 pills in container at home.

## 2022-07-18 ENCOUNTER — Ambulatory Visit: Payer: Medicare Other | Admitting: Pain Medicine

## 2022-07-19 ENCOUNTER — Other Ambulatory Visit: Payer: Self-pay | Admitting: Family

## 2022-07-19 ENCOUNTER — Ambulatory Visit: Payer: Medicare Other | Admitting: Neurology

## 2022-07-19 DIAGNOSIS — R2990 Unspecified symptoms and signs involving the nervous system: Secondary | ICD-10-CM

## 2022-07-21 ENCOUNTER — Ambulatory Visit: Payer: Medicare Other

## 2022-07-24 ENCOUNTER — Ambulatory Visit: Payer: Self-pay

## 2022-07-24 ENCOUNTER — Ambulatory Visit: Payer: Medicare Other

## 2022-07-24 NOTE — Patient Outreach (Signed)
  Care Coordination   Follow Up Visit Note   07/24/2022 Name: Carmen [name] MRN: [id number] DOB: Feb 08, 1941  Carmen [name] is a 81 y.o. year old female who sees [place], [place], [place] for primary care. I  spoke with patient daughter, Donnalee Curry.  What matters to the patients health and wellness today?  Daughter states patient has sustained a couple of falls since last outreach with RNCM.  She reports patient having right hip/upper thigh pain.  Reports patient had a follow up with her primary provider since having the falls.  She state patient is scheduled to see a vascular doctor and is currently awaiting scheduling for MRI.  Daughter states does not use her Rollator like she should.  She states she is working on getting patient to consistently use her Rollator.  Denies patient having any falls since visit with primary care provider on 06/22/22.     Goals Addressed             This Visit's Progress    Patient Stated:  Management of right leg pain       Care Coordination Interventions: Evaluation of current treatment plan related to right leg pain and patient's adherence to plan as established by provider Reviewed medications with patient and discussed compliance Reviewed scheduled/upcoming provider appointments  Discussed fall prevention ( advised to continue to encourage patient to use Rollator or cane as advised by primary care provider to avoid falls) Continue to take pain medications as prescribed and use heating pad for pain relief          SDOH assessments and interventions completed:  No     Care Coordination Interventions Activated:  Yes  Care Coordination Interventions:  Yes, provided   Follow up plan: Follow up call scheduled for 09/11/22 at 10AM    Encounter Outcome:  Pt. Visit Completed   Quinn Plowman RN,BSN,CCM [place] [phone] direct line

## 2022-07-27 ENCOUNTER — Other Ambulatory Visit (INDEPENDENT_AMBULATORY_CARE_PROVIDER_SITE_OTHER): Payer: Medicare Other

## 2022-07-27 DIAGNOSIS — E039 Hypothyroidism, unspecified: Secondary | ICD-10-CM | POA: Diagnosis not present

## 2022-07-27 LAB — TSH: TSH: 10.68 u[IU]/mL — ABNORMAL HIGH (ref 0.35–5.50)

## 2022-07-27 NOTE — Progress Notes (Signed)
Even with increase of levothyroxine thyroid level has increased? Can we confirm she has been taking this?  If she does confirm taking 112 then we need to increase dose.  Is she taking biotin?

## 2022-07-29 ENCOUNTER — Other Ambulatory Visit: Payer: Self-pay | Admitting: Family

## 2022-07-29 ENCOUNTER — Other Ambulatory Visit: Payer: Self-pay | Admitting: Internal Medicine

## 2022-07-29 DIAGNOSIS — R2990 Unspecified symptoms and signs involving the nervous system: Secondary | ICD-10-CM

## 2022-07-29 DIAGNOSIS — E785 Hyperlipidemia, unspecified: Secondary | ICD-10-CM

## 2022-08-01 ENCOUNTER — Encounter: Payer: Self-pay | Admitting: Family

## 2022-08-01 ENCOUNTER — Ambulatory Visit (INDEPENDENT_AMBULATORY_CARE_PROVIDER_SITE_OTHER): Payer: Medicare Other | Admitting: Family

## 2022-08-01 VITALS — BP 138/84 | HR 88 | Temp 97.9°F | Resp 16 | Ht 60.0 in | Wt 189.2 lb

## 2022-08-01 DIAGNOSIS — E039 Hypothyroidism, unspecified: Secondary | ICD-10-CM | POA: Diagnosis not present

## 2022-08-01 DIAGNOSIS — I83813 Varicose veins of bilateral lower extremities with pain: Secondary | ICD-10-CM

## 2022-08-01 DIAGNOSIS — E538 Deficiency of other specified B group vitamins: Secondary | ICD-10-CM

## 2022-08-01 DIAGNOSIS — M2550 Pain in unspecified joint: Secondary | ICD-10-CM | POA: Diagnosis not present

## 2022-08-01 LAB — SEDIMENTATION RATE: Sed Rate: 3 mm/hr (ref 0–30)

## 2022-08-01 LAB — VITAMIN B12: Vitamin B-12: 448 pg/mL (ref 211–911)

## 2022-08-01 MED ORDER — LEVOTHYROXINE SODIUM 125 MCG PO TABS: 125.0000 ug | ORAL_TABLET | Freq: Every day | ORAL | 2 refills | Status: DC

## 2022-08-01 NOTE — Progress Notes (Signed)
Established Patient Office Visit  Subjective:  Patient ID: Carmen [name], female    DOB: [birth year]  Age: 81 y.o. MRN: [id number]  CC:  Chief Complaint  Patient presents with   Medication Dose Change    HPI Carmen [name] is here today for follow up.   Pt is with acute concerns.  Ongoing right sided leg pain, seeing pain management. Has had steroid injections, due for another pain injection has f/u scheduled.   Hypothyroid: levothyroxine 112 mcg once daily. She is tolerating well. She does state takes in the am separate from the other medications. Does have joint pains regularly, denies fatigue. Denies any pain in her neck. Has had new increase from 100 mcg to 112 mcg one month ago but tsh increased despite.   Lab Results  Component Value Date   TSH 10.68 (H) 07/27/2022   Prior to this was in the 8's with TSH.   Wt Readings from Last 3 Encounters:  08/01/22 189 lb 4 oz (85.8 kg)  07/17/22 187 lb (84.8 kg)  06/29/22 187 lb (84.8 kg)    Past Medical History:  Diagnosis Date   Abnormal CT scan, lumbar spine (05/11/2021) 05/17/2021   (05/11/2021) LUMBAR CT FINDINGS: Alignment: Lumbar levocurvature, apex L4. Mild lateral listhesis L4 on L5 of approximately 3 mm. Vertebrae: Remote appearing superior endplate deformities at T12 with 10% height loss and L1 with up to 20% height loss. Multilevel discogenic and facet degenerative changes. Mild bilateral SI joint arthrosis.  DISC LEVELS: T11-T12: Near complete disc height loss with de   Abnormal MRI, cervical spine (05/12/2021) 05/17/2021   (05/12/2021) CERVICAL MRI FINDINGS: Motion artifact is present. Posterior Fossa, vertebral arteries, paraspinal tissues: Left superior cerebellar infarct.   DISC LEVELS: C2-C3: Disc bulge with endplate osteophytes. Uncovertebral and facet hypertrophy. C3-C4: Disc bulge with endplate osteophytes. Uncovertebral and facet hypertrophy. Mild canal stenosis. Marked foraminal stenosis. C4-C5: Disc bulge w   Allergy     Anemia    Anxiety    Arthritis    Back pain    Coronary artery disease    Depression    History of stroke 10/02/2020   Hypertension    Peripheral vascular disease ([place])    Personal history of nicotine dependence 10/02/2020   Prsnl hx of TIA (TIA), and cereb infrc w/o resid deficits 10/02/2020   Stroke [facility])    Thyroid disease     Past Surgical History:  Procedure Laterality Date   back injections     CARDIAC CATHETERIZATION     CATARACT EXTRACTION     CORONARY ANGIOPLASTY     CORONARY ARTERY BYPASS GRAFT     2005    Family History  Problem Relation Age of Onset   Breast cancer Mother    Heart attack Father    Bipolar disorder Daughter    Diabetes Mellitus II Neg Hx     Social History   Socioeconomic History   Marital status: Widowed    Spouse name: Not on file   Number of children: 3   Years of education: Not on file   Highest education level: Not on file  Occupational History   Not on file  Tobacco Use   Smoking status: Former    Years: 30.00    Types: Cigarettes    Quit date: 05/2020    Years since quitting: 2.2   Smokeless tobacco: Never   Tobacco comments:    Smoked about 1 pack per month  Vaping Use   Vaping Use: Never  used  Substance and Sexual Activity   Alcohol use: Not Currently   Drug use: Never   Sexual activity: Not Currently  Other Topics Concern   Not on file  Social History Narrative   ** Merged History Encounter **       Social Determinants of Health   Financial Resource Strain: Low Risk  (07/29/2021)   Overall Financial Resource Strain (CARDIA)    Difficulty of Paying Living Expenses: Not hard at all  Food Insecurity: No Food Insecurity (06/19/2022)   Hunger Vital Sign    Worried About Running Out of Food in the Last Year: Never true    Ran Out of Food in the Last Year: Never true  Transportation Needs: No Transportation Needs (06/19/2022)   PRAPARE - Hydrologist (Medical): No    Lack of  Transportation (Non-Medical): No  Physical Activity: Not on file  Stress: Not on file  Social Connections: Not on file  Intimate Partner Violence: Not on file    Outpatient Medications Prior to Visit  Medication Sig Dispense Refill   aspirin EC 81 MG tablet Take 81 mg by mouth daily. Swallow whole.     atorvastatin (LIPITOR) 40 MG tablet TAKE ONE TABLET BY MOUTH AT BEDTIME 90 tablet 0   calcium carbonate (OSCAL) 1500 (600 Ca) MG TABS tablet Take 1 tablet (1,500 mg total) by mouth 2 (two) times daily with a meal. 60 tablet 2   cetirizine (ZYRTEC) 10 MG tablet Take 10 mg by mouth daily.     CHOLECALCIFEROL PO Take 1 capsule by mouth daily.     Cyanocobalamin (VITAMIN B 12 PO) Take by mouth.     ELIQUIS 5 MG TABS tablet NEW PRESCRIPTION REQUEST: Eliquis 5 Mg TAKE ONE TABLET BY MOUTH TWICE DAILY 180 tablet 3   hydrochlorothiazide (HYDRODIURIL) 25 MG tablet TAKE ONE TABLET BY MOUTH DAILY 90 tablet 0   HYDROcodone-acetaminophen (NORCO/VICODIN) 5-325 MG tablet Take 1 tablet by mouth 2 (two) times daily as needed for severe pain. Must last 30 days. 60 tablet 0   [START ON 08/28/2022] HYDROcodone-acetaminophen (NORCO/VICODIN) 5-325 MG tablet Take 1 tablet by mouth 2 (two) times daily as needed for severe pain. Must last 30 days. 60 tablet 0   [START ON 09/27/2022] HYDROcodone-acetaminophen (NORCO/VICODIN) 5-325 MG tablet Take 1 tablet by mouth 2 (two) times daily as needed for severe pain. Must last 30 days. 60 tablet 0   lidocaine (LIDODERM) 5 % Place 1 patch onto the skin daily. Remove & Discard patch within 12 hours or as directed by MD 30 patch 0   metoprolol succinate (TOPROL-XL) 100 MG 24 hr tablet TAKE 1/2 TABLET BY MOUTH AT BEDTIME 45 tablet 0   mupirocin ointment (BACTROBAN) 2 % Apply 1 Application topically 2 (two) times daily. 22 g 0   naloxone (NARCAN) nasal spray 4 mg/0.1 mL Place 1 spray into the nose as needed for up to 365 doses (for opioid-induced respiratory depresssion). In case of  emergency (overdose), spray once into each nostril. If no response within 3 minutes, repeat application and call 416. 1 each 0   traZODone (DESYREL) 100 MG tablet Take 1 tablet (100 mg total) by mouth at bedtime as needed for sleep. for sleep 90 tablet 1   levothyroxine (SYNTHROID) 112 MCG tablet Take 1 tablet (112 mcg total) by mouth daily. 90 tablet 0   divalproex (DEPAKOTE) 500 MG DR tablet Take 1 tablet (500 mg total) by mouth at bedtime. 30 tablet  0   No facility-administered medications prior to visit.    No Known Allergies       Objective:    Physical Exam Constitutional:      Appearance: Normal appearance. She is obese.  Neck:     Thyroid: No thyromegaly or thyroid tenderness.  Cardiovascular:     Rate and Rhythm: Normal rate and regular rhythm.  Pulmonary:     Effort: Pulmonary effort is normal.     Breath sounds: Normal breath sounds.  Musculoskeletal:     Right lower leg: No edema.     Left lower leg: No edema.  Neurological:     General: No focal deficit present.     Mental Status: She is alert and oriented to person, place, and time. Mental status is at baseline.  Psychiatric:        Mood and Affect: Mood normal.        Behavior: Behavior normal.        Thought Content: Thought content normal.        Judgment: Judgment normal.       BP 138/84   Pulse 88   Temp 97.9 F (36.6 C)   Resp 16   Ht 5' (1.524 m)   Wt 189 lb 4 oz (85.8 kg)   SpO2 98%   BMI 36.96 kg/m  Wt Readings from Last 3 Encounters:  08/01/22 189 lb 4 oz (85.8 kg)  07/17/22 187 lb (84.8 kg)  06/29/22 187 lb (84.8 kg)     Health Maintenance Due  Topic Date Due   Medicare Annual Wellness (AWV)  Never done   COVID-19 Vaccine (1) Never done   TETANUS/TDAP  Never done   Zoster Vaccines- Shingrix (1 of 2) Never done   Pneumonia Vaccine 28+ Years old (1 - PCV) Never done   DEXA SCAN  Never done    There are no preventive care reminders to display for this patient.  Lab Results   Component Value Date   TSH 10.68 (H) 07/27/2022   Lab Results  Component Value Date   WBC 10.3 05/08/2022   HGB 13.9 05/08/2022   HGB 13.1 05/08/2022   HCT 41.0 05/08/2022   HCT 39.2 05/08/2022   MCV 92.7 05/08/2022   PLT 216 05/08/2022   Lab Results  Component Value Date   NA 136 05/08/2022   NA 137 05/08/2022   K 3.6 05/08/2022   K 3.6 05/08/2022   CO2 22 05/08/2022   GLUCOSE 135 (H) 05/08/2022   GLUCOSE 138 (H) 05/08/2022   BUN 15 05/08/2022   BUN 13 05/08/2022   CREATININE 0.90 05/08/2022   CREATININE 0.97 05/08/2022   BILITOT 0.7 05/08/2022   ALKPHOS 42 05/08/2022   AST 26 05/08/2022   ALT 20 05/08/2022   PROT 7.0 05/08/2022   ALBUMIN 4.3 05/08/2022   CALCIUM 9.6 05/08/2022   ANIONGAP 13 05/08/2022   EGFR 71 01/11/2022   GFR 67.85 10/18/2021   Lab Results  Component Value Date   CHOL 156 05/09/2022   Lab Results  Component Value Date   HDL 48 05/09/2022   Lab Results  Component Value Date   LDLCALC 83 05/09/2022   Lab Results  Component Value Date   TRIG 125 05/09/2022   Lab Results  Component Value Date   CHOLHDL 3.3 05/09/2022   Lab Results  Component Value Date   HGBA1C 6.3 (H) 05/08/2022      Assessment & Plan:   Problem List Items Addressed This Visit  Cardiovascular and Mediastinum   Varicose veins of both lower extremities with pain    Ongoing chronic bilateral low pain patient to keep appointment as scheduled with vascular surgeon for eval treat        Endocrine   Acquired hypothyroidism    We will repeat TSH again in 4 weeks Advised patient to increase to 125 mcg daily of levothyroxine Again educated patient on taking separately from other medications and her food by at least 30 minutes      Relevant Medications   levothyroxine (SYNTHROID) 125 MCG tablet     Other   Vitamin B12 deficiency - Primary    Repeat vitamin B12 today pending results If still less than 400 may consider injections along with  over-the-counter B12 dosing.      Relevant Orders   Vitamin B12   RESOLVED: Polyarthralgia   Relevant Orders   ANA   Rheumatoid factor   Sedimentation rate    Meds ordered this encounter  Medications   levothyroxine (SYNTHROID) 125 MCG tablet    Sig: Take 1 tablet (125 mcg total) by mouth daily.    Dispense:  30 tablet    Refill:  2    Order Specific Question:   Supervising Provider    Answer:   BEDSOLE, AMY E [[id number]]    Follow-up: No follow-ups on file.    Eugenia Pancoast, FNP

## 2022-08-01 NOTE — Patient Instructions (Addendum)
Stop lipitor for about four days and see if any improvement in joints.   Stop by the lab prior to leaving today. I will notify you of your results once received.    Regards,   Eugenia Pancoast FNP-C

## 2022-08-01 NOTE — Assessment & Plan Note (Signed)
We will repeat TSH again in 4 weeks Advised patient to increase to 125 mcg daily of levothyroxine Again educated patient on taking separately from other medications and her food by at least 30 minutes

## 2022-08-01 NOTE — Assessment & Plan Note (Signed)
Repeat vitamin B12 today pending results If still less than 400 may consider injections along with over-the-counter B12 dosing.

## 2022-08-01 NOTE — Assessment & Plan Note (Signed)
Ongoing chronic bilateral low pain patient to keep appointment as scheduled with vascular surgeon for eval treat

## 2022-08-04 LAB — ANTI-NUCLEAR AB-TITER (ANA TITER): ANA Titer 1: 1:80 {titer} — ABNORMAL HIGH

## 2022-08-04 LAB — ANA: Anti Nuclear Antibody (ANA): POSITIVE — AB

## 2022-08-04 LAB — RHEUMATOID FACTOR: Rheumatoid fact SerPl-aCnc: <14 [IU]/mL

## 2022-08-08 NOTE — Progress Notes (Signed)
Autoimmune antibody is positive with a ratio of 1:80.  This is a low positive so there is a chance that this could be a false positive however given the chronic pain that Kevia has not experienced any ongoing pain I do not think that it would be out of the equation to refer her to rheumatology if this is something that she would like to do.  Is there any history of known autoimmune disease in the family such as rheumatoid arthritis psoriatic arthritis lupus and/or Crohn's?

## 2022-08-10 ENCOUNTER — Telehealth: Payer: Self-pay

## 2022-08-10 ENCOUNTER — Encounter (INDEPENDENT_AMBULATORY_CARE_PROVIDER_SITE_OTHER): Payer: Medicare Other | Admitting: Nurse Practitioner

## 2022-08-10 ENCOUNTER — Encounter (INDEPENDENT_AMBULATORY_CARE_PROVIDER_SITE_OTHER): Payer: Medicare Other

## 2022-08-10 NOTE — Telephone Encounter (Signed)
Her insurance denied auth for MRI of right hip. They denied it because we aren't looking for a tear, break, injury etc. They don't want to approve for just hip pain.  They said you can call to discuss with the medical doctor in [facility]. Phone number is [phone], just tell them you want to do [facility] they wouldn't let me schedule one.  There is no reference number, just use patient dob May 08, 2041 and insurance ID # which is [id number]

## 2022-08-11 ENCOUNTER — Other Ambulatory Visit (INDEPENDENT_AMBULATORY_CARE_PROVIDER_SITE_OTHER): Payer: Self-pay | Admitting: Vascular Surgery

## 2022-08-11 ENCOUNTER — Other Ambulatory Visit: Payer: Self-pay | Admitting: *Deleted

## 2022-08-11 ENCOUNTER — Other Ambulatory Visit: Payer: Self-pay | Admitting: Family

## 2022-08-11 ENCOUNTER — Encounter: Payer: Self-pay | Admitting: *Deleted

## 2022-08-11 ENCOUNTER — Telehealth: Payer: Self-pay | Admitting: *Deleted

## 2022-08-11 DIAGNOSIS — E785 Hyperlipidemia, unspecified: Secondary | ICD-10-CM

## 2022-08-11 DIAGNOSIS — R768 Other specified abnormal immunological findings in serum: Secondary | ICD-10-CM

## 2022-08-11 DIAGNOSIS — I25810 Atherosclerosis of coronary artery bypass graft(s) without angina pectoris: Secondary | ICD-10-CM

## 2022-08-11 DIAGNOSIS — I83893 Varicose veins of bilateral lower extremities with other complications: Secondary | ICD-10-CM

## 2022-08-11 DIAGNOSIS — M2550 Pain in unspecified joint: Secondary | ICD-10-CM

## 2022-08-11 DIAGNOSIS — G894 Chronic pain syndrome: Secondary | ICD-10-CM

## 2022-08-11 DIAGNOSIS — Z79899 Other long term (current) drug therapy: Secondary | ICD-10-CM

## 2022-08-11 NOTE — Telephone Encounter (Signed)
Reviewing Carmen [name]'s messages as she is no longer in our department.  Letter and MyChart message sent to the patient asking her to come for labs at her convenience.  Orders placed for at: Lipid panel/ ALT

## 2022-08-11 NOTE — Telephone Encounter (Signed)
-----   Message from Annia Belt, RN sent at 03/17/2022  9:16 AM EDT ----- Regarding: f/u lab Follow up mid September for pt's 3 month fasting lipid/alt.

## 2022-08-11 NOTE — Progress Notes (Signed)
orders

## 2022-08-14 ENCOUNTER — Encounter: Payer: Self-pay | Admitting: *Deleted

## 2022-08-14 ENCOUNTER — Ambulatory Visit (INDEPENDENT_AMBULATORY_CARE_PROVIDER_SITE_OTHER): Payer: Medicare Other

## 2022-08-14 VITALS — Ht 60.0 in | Wt 187.0 lb

## 2022-08-14 DIAGNOSIS — Z0000 Encounter for general adult medical examination without abnormal findings: Secondary | ICD-10-CM | POA: Diagnosis not present

## 2022-08-14 NOTE — Patient Instructions (Signed)
Carmen [name] , Thank you for taking time to come for your Medicare Wellness Visit. I appreciate your ongoing commitment to your health goals. Please review the following plan we discussed and let me know if I can assist you in the future.   Screening recommendations/referrals: Colonoscopy: not required Mammogram: not required Bone Density: due Recommended yearly ophthalmology/optometry visit for glaucoma screening and checkup Recommended yearly dental visit for hygiene and checkup  Vaccinations: Influenza vaccine: completed 06/22/2022 Pneumococcal vaccine: daughter will check  Tdap vaccine: daughter will check Shingles vaccine: discussed   Covid-19: decline  Advanced directives: Please bring a copy of your POA (Power of Attorney) and/or Living Will to your next appointment.   Conditions/risks identified: none  Next appointment: Follow up in one year for your annual wellness visit    Preventive Care 65 Years and Older, Female Preventive care refers to lifestyle choices and visits with your health care provider that can promote health and wellness. What does preventive care include? A yearly physical exam. This is also called an annual well check. Dental exams once or twice a year. Routine eye exams. Ask your health care provider how often you should have your eyes checked. Personal lifestyle choices, including: Daily care of your teeth and gums. Regular physical activity. Eating a healthy diet. Avoiding tobacco and drug use. Limiting alcohol use. Practicing safe sex. Taking low-dose aspirin every day. Taking vitamin and mineral supplements as recommended by your health care provider. What happens during an annual well check? The services and screenings done by your health care provider during your annual well check will depend on your age, overall health, lifestyle risk factors, and family history of disease. Counseling  Your health care provider may ask you questions about  your: Alcohol use. Tobacco use. Drug use. Emotional well-being. Home and relationship well-being. Sexual activity. Eating habits. History of falls. Memory and ability to understand (cognition). Work and work Astronomer. Reproductive health. Screening  You may have the following tests or measurements: Height, weight, and BMI. Blood pressure. Lipid and cholesterol levels. These may be checked every 5 years, or more frequently if you are over 63 years old. Skin check. Lung cancer screening. You may have this screening every year starting at age 33 if you have a 30-pack-year history of smoking and currently smoke or have quit within the past 15 years. Fecal occult blood test (FOBT) of the stool. You may have this test every year starting at age 34. Flexible sigmoidoscopy or colonoscopy. You may have a sigmoidoscopy every 5 years or a colonoscopy every 10 years starting at age 71. Hepatitis C blood test. Hepatitis B blood test. Sexually transmitted disease (STD) testing. Diabetes screening. This is done by checking your blood sugar (glucose) after you have not eaten for a while (fasting). You may have this done every 1-3 years. Bone density scan. This is done to screen for osteoporosis. You may have this done starting at age 73. Mammogram. This may be done every 1-2 years. Talk to your health care provider about how often you should have regular mammograms. Talk with your health care provider about your test results, treatment options, and if necessary, the need for more tests. Vaccines  Your health care provider may recommend certain vaccines, such as: Influenza vaccine. This is recommended every year. Tetanus, diphtheria, and acellular pertussis (Tdap, Td) vaccine. You may need a Td booster every 10 years. Zoster vaccine. You may need this after age 85. Pneumococcal 13-valent conjugate (PCV13) vaccine. One dose is recommended  after age 14. Pneumococcal polysaccharide (PPSV23) vaccine.  One dose is recommended after age 33. Talk to your health care provider about which screenings and vaccines you need and how often you need them. This information is not intended to replace advice given to you by your health care provider. Make sure you discuss any questions you have with your health care provider. Document Released: 10/15/2015 Document Revised: 06/07/2016 Document Reviewed: 07/20/2015 Elsevier Interactive Patient Education  2017 [place] Prevention in the Home Falls can cause injuries. They can happen to people of all ages. There are many things you can do to make your home safe and to help prevent falls. What can I do on the outside of my home? Regularly fix the edges of walkways and driveways and fix any cracks. Remove anything that might make you trip as you walk through a door, such as a raised step or threshold. Trim any bushes or trees on the path to your home. Use bright outdoor lighting. Clear any walking paths of anything that might make someone trip, such as rocks or tools. Regularly check to see if handrails are loose or broken. Make sure that both sides of any steps have handrails. Any raised decks and porches should have guardrails on the edges. Have any leaves, snow, or ice cleared regularly. Use sand or salt on walking paths during winter. Clean up any spills in your garage right away. This includes oil or grease spills. What can I do in the bathroom? Use night lights. Install grab bars by the toilet and in the tub and shower. Do not use towel bars as grab bars. Use non-skid mats or decals in the tub or shower. If you need to sit down in the shower, use a plastic, non-slip stool. Keep the floor dry. Clean up any water that spills on the floor as soon as it happens. Remove soap buildup in the tub or shower regularly. Attach bath mats securely with double-sided non-slip rug tape. Do not have throw rugs and other things on the floor that can make  you trip. What can I do in the bedroom? Use night lights. Make sure that you have a light by your bed that is easy to reach. Do not use any sheets or blankets that are too big for your bed. They should not hang down onto the floor. Have a firm chair that has side arms. You can use this for support while you get dressed. Do not have throw rugs and other things on the floor that can make you trip. What can I do in the kitchen? Clean up any spills right away. Avoid walking on wet floors. Keep items that you use a lot in easy-to-reach places. If you need to reach something above you, use a strong step stool that has a grab bar. Keep electrical cords out of the way. Do not use floor polish or wax that makes floors slippery. If you must use wax, use non-skid floor wax. Do not have throw rugs and other things on the floor that can make you trip. What can I do with my stairs? Do not leave any items on the stairs. Make sure that there are handrails on both sides of the stairs and use them. Fix handrails that are broken or loose. Make sure that handrails are as long as the stairways. Check any carpeting to make sure that it is firmly attached to the stairs. Fix any carpet that is loose or worn. Avoid having throw  rugs at the top or bottom of the stairs. If you do have throw rugs, attach them to the floor with carpet tape. Make sure that you have a light switch at the top of the stairs and the bottom of the stairs. If you do not have them, ask someone to add them for you. What else can I do to help prevent falls? Wear shoes that: Do not have high heels. Have rubber bottoms. Are comfortable and fit you well. Are closed at the toe. Do not wear sandals. If you use a stepladder: Make sure that it is fully opened. Do not climb a closed stepladder. Make sure that both sides of the stepladder are locked into place. Ask someone to hold it for you, if possible. Clearly mark and make sure that you can  see: Any grab bars or handrails. First and last steps. Where the edge of each step is. Use tools that help you move around (mobility aids) if they are needed. These include: Canes. Walkers. Scooters. Crutches. Turn on the lights when you go into a dark area. Replace any light bulbs as soon as they burn out. Set up your furniture so you have a clear path. Avoid moving your furniture around. If any of your floors are uneven, fix them. If there are any pets around you, be aware of where they are. Review your medicines with your doctor. Some medicines can make you feel dizzy. This can increase your chance of falling. Ask your doctor what other things that you can do to help prevent falls. This information is not intended to replace advice given to you by your health care provider. Make sure you discuss any questions you have with your health care provider. Document Released: 07/15/2009 Document Revised: 02/24/2016 Document Reviewed: 10/23/2014 Elsevier Interactive Patient Education  2017 [employer].

## 2022-08-14 NOTE — Progress Notes (Signed)
I connected with Carmen [name] today by telephone and verified that I am speaking with the correct person using two identifiers. Location patient: home Location provider: work Persons participating in the virtual visit: Carmen [name], Carmen [name] (daughter), Carmen Ponder LPN.   I discussed the limitations, risks, security and privacy concerns of performing an evaluation and management service by telephone and the availability of in person appointments. I also discussed with the patient that there may be a patient responsible charge related to this service. The patient expressed understanding and verbally consented to this telephonic visit.    Interactive audio and video telecommunications were attempted between this provider and patient, however failed, due to patient having technical difficulties OR patient did not have access to video capability.  We continued and completed visit with audio only.     Vital signs may be patient reported or missing.  Subjective:   [name] is a 81 y.o. female who presents for Medicare Annual (Subsequent) preventive examination.  Review of Systems     Cardiac Risk Factors include: advanced age (>87men, >18 women);hypertension;obesity (BMI >30kg/m2)     Objective:    Today's Vitals   08/14/22 0911 08/14/22 0912  Weight: 187 lb (84.8 kg)   Height: 5' (1.524 m)   PainSc:  9    Body mass index is 36.52 kg/m.     08/14/2022    9:19 AM 06/29/2022    8:14 AM 05/08/2022    6:10 PM 04/17/2022    8:09 AM 06/28/2021    9:20 AM 06/16/2021    1:42 PM 05/17/2021    3:04 PM  Advanced Directives  Does Patient Have a Medical Advance Directive? Yes Yes Yes Yes Yes Yes Yes  Type of Estate agent of [place];Living will Healthcare Power of [place];Living will Healthcare Power of [employer] of [place];Living will   Healthcare Power of [place];Living will  Does patient want to make changes to medical advance directive?   No -  Patient declined      Copy of Healthcare Power of Attorney in Chart? No - copy requested  No - copy requested        Current Medications (verified) Outpatient Encounter Medications as of 08/14/2022  Medication Sig   aspirin EC 81 MG tablet Take 81 mg by mouth daily. Swallow whole.   atorvastatin (LIPITOR) 40 MG tablet TAKE ONE TABLET BY MOUTH AT BEDTIME   calcium carbonate (OSCAL) 1500 (600 Ca) MG TABS tablet Take 1 tablet (1,500 mg total) by mouth 2 (two) times daily with a meal.   cetirizine (ZYRTEC) 10 MG tablet Take 10 mg by mouth daily.   CHOLECALCIFEROL PO Take 1 capsule by mouth daily.   Cyanocobalamin (VITAMIN B 12 PO) Take by mouth.   ELIQUIS 5 MG TABS tablet NEW PRESCRIPTION REQUEST: Eliquis 5 Mg TAKE ONE TABLET BY MOUTH TWICE DAILY   hydrochlorothiazide (HYDRODIURIL) 25 MG tablet TAKE ONE TABLET BY MOUTH DAILY   HYDROcodone-acetaminophen (NORCO/VICODIN) 5-325 MG tablet Take 1 tablet by mouth 2 (two) times daily as needed for severe pain. Must last 30 days.   [START ON 08/28/2022] HYDROcodone-acetaminophen (NORCO/VICODIN) 5-325 MG tablet Take 1 tablet by mouth 2 (two) times daily as needed for severe pain. Must last 30 days.   [START ON 09/27/2022] HYDROcodone-acetaminophen (NORCO/VICODIN) 5-325 MG tablet Take 1 tablet by mouth 2 (two) times daily as needed for severe pain. Must last 30 days.   levothyroxine (SYNTHROID) 125 MCG tablet Take 1 tablet (125 mcg total) by mouth  daily.   lidocaine (LIDODERM) 5 % Place 1 patch onto the skin daily. Remove & Discard patch within 12 hours or as directed by MD   magnesium oxide (MAG-OX) 400 (240 Mg) MG tablet Take 400 mg by mouth daily.   metoprolol succinate (TOPROL-XL) 100 MG 24 hr tablet TAKE 1/2 TABLET BY MOUTH AT BEDTIME   mupirocin ointment (BACTROBAN) 2 % Apply 1 Application topically 2 (two) times daily.   naloxone (NARCAN) nasal spray 4 mg/0.1 mL Place 1 spray into the nose as needed for up to 365 doses (for opioid-induced respiratory  depresssion). In case of emergency (overdose), spray once into each nostril. If no response within 3 minutes, repeat application and call 911.   traZODone (DESYREL) 100 MG tablet Take 1 tablet (100 mg total) by mouth at bedtime as needed for sleep. for sleep   divalproex (DEPAKOTE) 500 MG DR tablet Take 1 tablet (500 mg total) by mouth at bedtime.   No facility-administered encounter medications on file as of 08/14/2022.    Allergies (verified) Patient has no known allergies.   History: Past Medical History:  Diagnosis Date   Abnormal CT scan, lumbar spine (05/11/2021) 05/17/2021   (05/11/2021) LUMBAR CT FINDINGS: Alignment: Lumbar levocurvature, apex L4. Mild lateral listhesis L4 on L5 of approximately 3 mm. Vertebrae: Remote appearing superior endplate deformities at T12 with 10% height loss and L1 with up to 20% height loss. Multilevel discogenic and facet degenerative changes. Mild bilateral SI joint arthrosis.  DISC LEVELS: T11-T12: Near complete disc height loss with de   Abnormal MRI, cervical spine (05/12/2021) 05/17/2021   (05/12/2021) CERVICAL MRI FINDINGS: Motion artifact is present. Posterior Fossa, vertebral arteries, paraspinal tissues: Left superior cerebellar infarct.   DISC LEVELS: C2-C3: Disc bulge with endplate osteophytes. Uncovertebral and facet hypertrophy. C3-C4: Disc bulge with endplate osteophytes. Uncovertebral and facet hypertrophy. Mild canal stenosis. Marked foraminal stenosis. C4-C5: Disc bulge w   Allergy    Anemia    Anxiety    Arthritis    Back pain    Coronary artery disease    Depression    History of stroke 10/02/2020   Hypertension    Peripheral vascular disease (HCC)    Personal history of nicotine dependence 10/02/2020   Prsnl hx of TIA (TIA), and cereb infrc w/o resid deficits 10/02/2020   Stroke [facility](HCC)    Thyroid disease    Past Surgical History:  Procedure Laterality Date   back injections     CARDIAC CATHETERIZATION     CATARACT EXTRACTION      CORONARY ANGIOPLASTY     CORONARY ARTERY BYPASS GRAFT     2005   Family History  Problem Relation Age of Onset   Breast cancer Mother    Heart attack Father    Bipolar disorder Daughter    Diabetes Mellitus II Neg Hx    Social History   Socioeconomic History   Marital status: Widowed    Spouse name: Not on file   Number of children: 3   Years of education: Not on file   Highest education level: Not on file  Occupational History   Not on file  Tobacco Use   Smoking status: Former    Years: 30.00    Types: Cigarettes    Quit date: 05/2020    Years since quitting: 2.2   Smokeless tobacco: Never   Tobacco comments:    Smoked about 1 pack per month  Vaping Use   Vaping Use: Never used  Substance and  Sexual Activity   Alcohol use: Not Currently   Drug use: Yes    Types: Hydrocodone   Sexual activity: Not Currently  Other Topics Concern   Not on file  Social History Narrative   ** Merged History Encounter **       Social Determinants of Health   Financial Resource Strain: Low Risk  (08/14/2022)   Overall Financial Resource Strain (CARDIA)    Difficulty of Paying Living Expenses: Not hard at all  Food Insecurity: No Food Insecurity (08/14/2022)   Hunger Vital Sign    Worried About Running Out of Food in the Last Year: Never true    Ran Out of Food in the Last Year: Never true  Transportation Needs: No Transportation Needs (08/14/2022)   PRAPARE - Administrator, Civil Service (Medical): No    Lack of Transportation (Non-Medical): No  Physical Activity: Insufficiently Active (08/14/2022)   Exercise Vital Sign    Days of Exercise per Week: 7 days    Minutes of Exercise per Session: 20 min  Stress: No Stress Concern Present (08/14/2022)   [employer] of Occupational Health - Occupational Stress Questionnaire    Feeling of Stress : Not at all  Social Connections: Not on file    Tobacco Counseling Counseling given: Not Answered Tobacco comments:  Smoked about 1 pack per month   Clinical Intake:  Pre-visit preparation completed: Yes  Pain : 0-10 Pain Score: 9  Pain Type: Chronic pain Pain Location: Leg Pain Orientation: Right Pain Descriptors / Indicators: Aching Pain Onset: More than a month ago Pain Frequency: Constant     Nutritional Status: BMI > 30  Obese Nutritional Risks: None Diabetes: No  How often do you need to have someone help you when you read instructions, pamphlets, or other written materials from your doctor or pharmacy?: 1 - Never  Diabetic?no  Interpreter Needed?: No  Information entered by :: NAllen LPN   Activities of Daily Living    08/14/2022    9:21 AM 05/08/2022    6:10 PM  In your present state of health, do you have any difficulty performing the following activities:  Hearing? 1 0  Comment wears hearing aids   Vision? 0 0  Difficulty concentrating or making decisions? 1 0  Comment short term memory   Walking or climbing stairs? 1 1  Dressing or bathing? 0 1  Doing errands, shopping? 1 1  Preparing Food and eating ? N   Using the Toilet? N   In the past six months, have you accidently leaked urine? N   Do you have problems with loss of bowel control? N   Managing your Medications? Y   Comment daughter sets up   Managing your Finances? Y   Housekeeping or managing your Housekeeping? N     Patient Care Team: Mort Sawyers, FNP as PCP - General (Family Medicine) End, Cristal Deer, MD as Consulting Physician (Cardiology) Otho Ket, RN as [facility] Care Management  Indicate any recent Medical Services you may have received from other than [facility] providers in the past year (date may be approximate).     Assessment:   This is a routine wellness examination for Carmen [name].  Hearing/Vision screen Vision Screening - Comments:: No regular eye exams, Dr. Larence Penning  Dietary issues and exercise activities discussed: Current Exercise Habits: Home exercise routine, Time  (Minutes): 20, Frequency (Times/Week): 7, Weekly Exercise (Minutes/Week): 140   Goals Addressed  This Visit's Progress    Patient Stated       08/14/2022, wants to walk more       Depression Screen    08/14/2022    9:21 AM 06/29/2022    8:14 AM 04/17/2022    8:09 AM 10/24/2021   10:53 AM 10/18/2021   11:39 AM 07/26/2021    2:13 PM 06/16/2021    1:42 PM  PHQ 2/9 Scores  PHQ - 2 Score 0 0 0 0  PHQ- 9 Score    8       Fall Risk    08/14/2022    9:20 AM 07/17/2022    8:13 AM 06/29/2022    8:13 AM 06/13/2022    8:02 AM 04/17/2022    8:08 AM  Fall Risk   Falls in the past year? 1 1 0 1 1  Comment making moves that she should not      Number falls in past yr: Injury with Fall? 1 0  0 1  Comment  Bruises   pain has gotten worse since falls  Risk for fall due to : History of fall(s);Impaired mobility;Medication side effect Impaired balance/gait     Risk for fall due to: Comment  Not using equipment     Follow up Falls prevention discussed;Education provided;Falls evaluation completed        FALL RISK PREVENTION PERTAINING TO THE HOME:  Any stairs in or around the home? Yes  If so, are there any without handrails? No  Home free of loose throw rugs in walkways, pet beds, electrical cords, etc? Yes  Adequate lighting in your home to reduce risk of falls? Yes   ASSISTIVE DEVICES UTILIZED TO PREVENT FALLS:  Life alert? Yes  Use of a cane, walker or w/c? Yes  Grab bars in the bathroom? Yes  Shower chair or bench in shower? Yes  Elevated toilet seat or a handicapped toilet? Yes   TIMED UP AND GO:  Was the test performed? No .      Cognitive Function:  6 CIT not administered. Daughter states she has trouble with memory.        Immunizations Immunization History  Administered Date(s) Administered   Fluad Quad(high Dose 65+) 06/22/2022    TDAP status: Due, Education has been provided regarding the importance of this vaccine. Advised  may receive this vaccine at local pharmacy or Health Dept. Aware to provide a copy of the vaccination record if obtained from local pharmacy or Health Dept. Verbalized acceptance and understanding.  Flu Vaccine status: Up to date  Pneumococcal vaccine status: Due, Education has been provided regarding the importance of this vaccine. Advised may receive this vaccine at local pharmacy or Health Dept. Aware to provide a copy of the vaccination record if obtained from local pharmacy or Health Dept. Verbalized acceptance and understanding.  Covid-19 vaccine status: Declined, Education has been provided regarding the importance of this vaccine but patient still declined. Advised may receive this vaccine at local pharmacy or Health Dept.or vaccine clinic. Aware to provide a copy of the vaccination record if obtained from local pharmacy or Health Dept. Verbalized acceptance and understanding.  Qualifies for Shingles Vaccine? Yes   Zostavax completed No   Shingrix Completed?: No.    Education has been provided regarding the importance of this vaccine. Patient has been advised to call insurance company to determine out of pocket expense if they have not yet received this vaccine. Advised  may also receive vaccine at local pharmacy or Health Dept. Verbalized acceptance and understanding.  Screening Tests Health Maintenance  Topic Date Due   Medicare Annual Wellness (AWV)  Never done   COVID-19 Vaccine (1) Never done   TETANUS/TDAP  Never done   Zoster Vaccines- Shingrix (1 of 2) Never done   Pneumonia Vaccine 77+ Years old (1 - PCV) Never done   DEXA SCAN  Never done   INFLUENZA VACCINE  Completed   HPV VACCINES  Aged Out    Health Maintenance  Health Maintenance Due  Topic Date Due   Medicare Annual Wellness (AWV)  Never done   COVID-19 Vaccine (1) Never done   TETANUS/TDAP  Never done   Zoster Vaccines- Shingrix (1 of 2) Never done   Pneumonia Vaccine 62+ Years old (1 - PCV) Never done   DEXA  SCAN  Never done    Colorectal cancer screening: No longer required.   Mammogram status: No longer required due to age.  Bone Density status: due  Lung Cancer Screening: (Low Dose CT Chest recommended if Age 63-80 years, 30 pack-year currently smoking OR have quit w/in 15years.) does not qualify.   Lung Cancer Screening Referral: no  Additional Screening:  Hepatitis C Screening: does not qualify;   Vision Screening: Recommended annual ophthalmology exams for early detection of glaucoma and other disorders of the eye. Is the patient up to date with their annual eye exam?  Yes  Who is the provider or what is the name of the office in which the patient attends annual eye exams? Dr. Larence Penning If pt is not established with a provider, would they like to be referred to a provider to establish care? No .   Dental Screening: Recommended annual dental exams for proper oral hygiene  Community Resource Referral / Chronic Care Management: CRR required this visit?  No   CCM required this visit?  No      Plan:     I have personally reviewed and noted the following in the patient's chart:   Medical and social history Use of alcohol, tobacco or illicit drugs  Current medications and supplements including opioid prescriptions. Patient is currently taking opioid prescriptions. Information provided to patient regarding non-opioid alternatives. Patient advised to discuss non-opioid treatment plan with their provider. Functional ability and status Nutritional status Physical activity Advanced directives List of other physicians Hospitalizations, surgeries, and ER visits in previous 12 months Vitals Screenings to include cognitive, depression, and falls Referrals and appointments  In addition, I have reviewed and discussed with patient certain preventive protocols, quality metrics, and best practice recommendations. A written personalized care plan for preventive services as well as general  preventive health recommendations were provided to patient.     Barb Merino, LPN   [id number]/84/[id number]   Nurse Notes: none  Due to this being a virtual visit, the after visit summary with patients personalized plan was offered to patient via mail or my-chart.  Patient would like to access on my-chart

## 2022-08-17 ENCOUNTER — Ambulatory Visit (INDEPENDENT_AMBULATORY_CARE_PROVIDER_SITE_OTHER): Payer: Medicare Other

## 2022-08-17 ENCOUNTER — Encounter (INDEPENDENT_AMBULATORY_CARE_PROVIDER_SITE_OTHER): Payer: Self-pay | Admitting: Nurse Practitioner

## 2022-08-17 ENCOUNTER — Ambulatory Visit (INDEPENDENT_AMBULATORY_CARE_PROVIDER_SITE_OTHER): Payer: Medicare Other | Admitting: Nurse Practitioner

## 2022-08-17 VITALS — BP 176/111 | HR 86 | Resp 18 | Ht 60.0 in | Wt 193.0 lb

## 2022-08-17 DIAGNOSIS — I10 Essential (primary) hypertension: Secondary | ICD-10-CM | POA: Diagnosis not present

## 2022-08-17 DIAGNOSIS — I83813 Varicose veins of bilateral lower extremities with pain: Secondary | ICD-10-CM

## 2022-08-17 DIAGNOSIS — I83893 Varicose veins of bilateral lower extremities with other complications: Secondary | ICD-10-CM

## 2022-08-17 DIAGNOSIS — M7989 Other specified soft tissue disorders: Secondary | ICD-10-CM | POA: Diagnosis not present

## 2022-08-17 DIAGNOSIS — M4807 Spinal stenosis, lumbosacral region: Secondary | ICD-10-CM | POA: Diagnosis not present

## 2022-08-18 ENCOUNTER — Emergency Department: Payer: Medicare Other

## 2022-08-18 ENCOUNTER — Other Ambulatory Visit: Payer: Self-pay

## 2022-08-18 ENCOUNTER — Emergency Department
Admission: EM | Admit: 2022-08-18 | Discharge: 2022-08-18 | Disposition: A | Discharge status: Home / Self Care | Payer: Medicare Other | Attending: Student in an Organized Health Care Education/Training Program | Admitting: Student in an Organized Health Care Education/Training Program

## 2022-08-18 DIAGNOSIS — Z7901 Long term (current) use of anticoagulants: Secondary | ICD-10-CM | POA: Insufficient documentation

## 2022-08-18 DIAGNOSIS — S0993XA Unspecified injury of face, initial encounter: Secondary | ICD-10-CM | POA: Diagnosis present

## 2022-08-18 DIAGNOSIS — M7981 Nontraumatic hematoma of soft tissue: Secondary | ICD-10-CM | POA: Diagnosis not present

## 2022-08-18 DIAGNOSIS — I10 Essential (primary) hypertension: Secondary | ICD-10-CM | POA: Diagnosis not present

## 2022-08-18 DIAGNOSIS — W01198A Fall on same level from slipping, tripping and stumbling with subsequent striking against other object, initial encounter: Secondary | ICD-10-CM | POA: Insufficient documentation

## 2022-08-18 DIAGNOSIS — S0511XA Contusion of eyeball and orbital tissues, right eye, initial encounter: Secondary | ICD-10-CM | POA: Diagnosis not present

## 2022-08-18 DIAGNOSIS — M79604 Pain in right leg: Secondary | ICD-10-CM | POA: Diagnosis not present

## 2022-08-18 DIAGNOSIS — S022XXA Fracture of nasal bones, initial encounter for closed fracture: Secondary | ICD-10-CM | POA: Diagnosis not present

## 2022-08-18 DIAGNOSIS — M25551 Pain in right hip: Secondary | ICD-10-CM | POA: Insufficient documentation

## 2022-08-18 DIAGNOSIS — M79671 Pain in right foot: Secondary | ICD-10-CM | POA: Diagnosis not present

## 2022-08-18 DIAGNOSIS — I4891 Unspecified atrial fibrillation: Secondary | ICD-10-CM | POA: Insufficient documentation

## 2022-08-18 DIAGNOSIS — W19XXXA Unspecified fall, initial encounter: Secondary | ICD-10-CM

## 2022-08-18 DIAGNOSIS — G9389 Other specified disorders of brain: Secondary | ICD-10-CM | POA: Diagnosis not present

## 2022-08-18 DIAGNOSIS — I6782 Cerebral ischemia: Secondary | ICD-10-CM | POA: Diagnosis not present

## 2022-08-18 DIAGNOSIS — H05231 Hemorrhage of right orbit: Secondary | ICD-10-CM

## 2022-08-18 LAB — CBC
HCT: 39.5 % (ref 36.0–46.0)
Hemoglobin: 13 g/dL (ref 12.0–15.0)
MCH: 29.5 pg (ref 26.0–34.0)
MCHC: 32.9 g/dL (ref 30.0–36.0)
MCV: 89.6 fL (ref 80.0–100.0)
Platelets: 218 10*3/uL (ref 150–400)
RBC: 4.41 MIL/uL (ref 3.87–5.11)
RDW: 13.5 % (ref 11.5–15.5)
WBC: 8.5 10*3/uL (ref 4.0–10.5)
nRBC: 0 % (ref 0.0–0.2)

## 2022-08-18 LAB — BASIC METABOLIC PANEL
Anion gap: 7 (ref 5–15)
BUN: 19 mg/dL (ref 8–23)
CO2: 28 mmol/L (ref 22–32)
Calcium: 9.5 mg/dL (ref 8.9–10.3)
Chloride: 105 mmol/L (ref 98–111)
Creatinine, Ser: 0.86 mg/dL (ref 0.44–1.00)
GFR, Estimated: >60 mL/min (ref >60)
Glucose, Bld: 116 mg/dL — ABNORMAL HIGH (ref 70–99)
Potassium: 3.8 mmol/L (ref 3.5–5.1)
Sodium: 140 mmol/L (ref 135–145)

## 2022-08-18 MED ORDER — FENTANYL CITRATE PF 50 MCG/ML IJ SOSY
50.0000 ug | PREFILLED_SYRINGE | INTRAMUSCULAR | Status: DC | PRN
  Administered 2022-08-18: 50 ug via INTRAVENOUS
  Filled 2022-08-18: qty 1

## 2022-08-18 MED ORDER — FLUORESCEIN SODIUM 1 MG OP STRP
1.0000 | ORAL_STRIP | Freq: Once | OPHTHALMIC | Status: AC
  Administered 2022-08-18: 1 via OPHTHALMIC
  Filled 2022-08-18: qty 1

## 2022-08-18 NOTE — ED Provider Notes (Signed)
[facility] Provider Note    Event Date/Time   First MD Initiated Contact with Patient 08/18/22 [id number]     (approximate)   History   Fall   HPI  Carmen [name] is a 81 y.o. female with a history of A-fib on anticoagulation presents to the ER after fall this morning.  States that she was using bedside commode lost footing fell hitting her right side of her face on the armrest.  Is complaining of right hip pain as well.  Denies any blurry vision or double vision.  No LOC.  Denies any chest pain or pressure no shortness of breath.     Physical Exam   Triage Vital Signs: ED Triage Vitals  Enc Vitals Group     BP 08/18/22 0844 (!) 162/100     Pulse Rate 08/18/22 0844 (!) 119     Resp 08/18/22 0844 19     Temp 08/18/22 0844 98 F (36.7 C)     Temp src --      SpO2 08/18/22 0844 94 %     Weight --      Height --      Head Circumference --      Peak Flow --      Pain Score 08/18/22 0842 9     Pain Loc --      Pain Edu? --      Excl. in GC? --     Most recent vital signs: Vitals:   08/18/22 0844  BP: (!) 162/100  Pulse: (!) 119  Resp: 19  Temp: 98 F (36.7 C)  SpO2: 94%     Constitutional: Alert  Eyes: Extraocular motions are intact.  No hyphema.  There is lateral scleral hematoma the right eye.  No proptosis., no abrasion or ulceration, no snellens lines Head: Large.  Orbital ecchymosis and swelling. Nose: No congestion/rhinnorhea. Mouth/Throat: Mucous membranes are moist.   Neck: Painless ROM.  Cardiovascular:   Good peripheral circulation. Respiratory: Normal respiratory effort.  No retractions.  Gastrointestinal: Soft and nontender.  Musculoskeletal:  no deformity Neurologic:  MAE spontaneously. No gross focal neurologic deficits are appreciated.  Skin:  Skin is warm, dry and intact. No rash noted. Psychiatric: Mood and affect are normal. Speech and behavior are normal.    ED Results / Procedures / Treatments   Labs (all labs  ordered are listed, but only abnormal results are displayed) Labs Reviewed  BASIC METABOLIC PANEL - Abnormal; Notable for the following components:      Result Value   Glucose, Bld 116 (*)    All other components within normal limits  CBC     EKG  ED ECG REPORT I, Willy Eddy, the attending physician, personally viewed and interpreted this ECG.   Date: 08/18/2022  EKG Time: 8:49  Rate: 100  Rhythm: afib  Axis: normal  Intervals: normal qt  ST&T Change: no stemi, no depression    RADIOLOGY Please see ED Course for my review and interpretation.  I personally reviewed all radiographic images ordered to evaluate for the above acute complaints and reviewed radiology reports and findings.  These findings were personally discussed with the patient.  Please see medical record for radiology report.    PROCEDURES:  Critical Care performed: No  Procedures   MEDICATIONS ORDERED IN ED: Medications  fentaNYL (SUBLIMAZE) injection 50 mcg (50 mcg Intravenous Given 08/18/22 0933)  fluorescein ophthalmic strip 1 strip (has no administration in time range)     IMPRESSION /  MDM / ASSESSMENT AND PLAN / ED COURSE  I reviewed the triage vital signs and the nursing notes.                              Differential diagnosis includes, but is not limited to, sdh, iph, ich, sah, fracture, contusion, concussion  Patient presenting to the ER for evaluation of symptoms as described above.  Based on symptoms, risk factors and considered above differential, this presenting complaint could reflect a potentially life-threatening illness therefore the patient will be placed on continuous pulse oximetry and telemetry for monitoring.  Laboratory evaluation will be sent to evaluate for the above complaints.  CT imaging ordered for the above differential  Clinical Course as of 08/18/22 1011  Fri Aug 18, 2022  0930 CT imaging on my review and interpretation without evidence of ICH or IPH.  No  fracture or contusion. [PR]  3267 X-ray without evidence of fracture. [PR]  1010 Patient reassessed with reassuring exam.  Has large periorbital hematoma and ecchymosis but no sign of globe rupture, ulceration or abrasion.  No findings suggest fracture.  Patient states she otherwise feels well.  She is in A-fib heart rate mildly tachycardic in the 100s but did not take her medication this morning due to the fall.  She took her a.m. medications.  She does appear stable and appropriate for outpatient follow-up.  We discussed signs symptoms which she should return to the ER follow-up with ophthalmology. [PR]    Clinical Course User Index [PR] Willy Eddy, MD     FINAL CLINICAL IMPRESSION(S) / ED DIAGNOSES   Final diagnoses:  Periorbital hematoma of right eye  Fall, initial encounter     Rx / DC Orders   ED Discharge Orders     None        Note:  This document was prepared using Dragon voice recognition software and may include unintentional dictation errors.    Willy Eddy, MD 08/18/22 1011

## 2022-08-18 NOTE — ED Triage Notes (Signed)
Pt comes with c/o fall this am. Pt was on toilet and fell forward hitting her head on wooden stool. Pt is from [facility] assisted living.   Pt has large hematoma to right eye. Pt does state pain to right hip that always hurts. Pt is on Eliquis

## 2022-08-21 ENCOUNTER — Ambulatory Visit (INDEPENDENT_AMBULATORY_CARE_PROVIDER_SITE_OTHER): Payer: Medicare Other | Admitting: Family

## 2022-08-21 ENCOUNTER — Other Ambulatory Visit: Payer: Self-pay | Admitting: Family

## 2022-08-21 ENCOUNTER — Encounter: Payer: Self-pay | Admitting: Family

## 2022-08-21 VITALS — BP 130/72 | HR 86 | Temp 98.2°F | Resp 16 | Ht 60.0 in | Wt 189.2 lb

## 2022-08-21 DIAGNOSIS — R296 Repeated falls: Secondary | ICD-10-CM | POA: Diagnosis not present

## 2022-08-21 DIAGNOSIS — H05231 Hemorrhage of right orbit: Secondary | ICD-10-CM | POA: Insufficient documentation

## 2022-08-21 DIAGNOSIS — E039 Hypothyroidism, unspecified: Secondary | ICD-10-CM

## 2022-08-21 DIAGNOSIS — R2990 Unspecified symptoms and signs involving the nervous system: Secondary | ICD-10-CM | POA: Diagnosis not present

## 2022-08-21 DIAGNOSIS — R2681 Unsteadiness on feet: Secondary | ICD-10-CM

## 2022-08-21 DIAGNOSIS — H5704 Mydriasis: Secondary | ICD-10-CM | POA: Insufficient documentation

## 2022-08-21 DIAGNOSIS — Z9181 History of falling: Secondary | ICD-10-CM

## 2022-08-21 DIAGNOSIS — G894 Chronic pain syndrome: Secondary | ICD-10-CM

## 2022-08-21 DIAGNOSIS — Z8673 Personal history of transient ischemic attack (TIA), and cerebral infarction without residual deficits: Secondary | ICD-10-CM

## 2022-08-21 DIAGNOSIS — E785 Hyperlipidemia, unspecified: Secondary | ICD-10-CM

## 2022-08-21 LAB — TSH: TSH: 7 u[IU]/mL — ABNORMAL HIGH (ref 0.35–5.50)

## 2022-08-21 MED ORDER — ATORVASTATIN CALCIUM 40 MG PO TABS: 40.0000 mg | ORAL_TABLET | Freq: Every day | ORAL | 0 refills | Status: DC

## 2022-08-21 MED ORDER — DIVALPROEX SODIUM 500 MG PO DR TAB: 500.0000 mg | DELAYED_RELEASE_TABLET | Freq: Every evening | ORAL | 0 refills | Status: DC

## 2022-08-21 MED ORDER — LEVOTHYROXINE SODIUM 137 MCG PO TABS: 137.0000 ug | ORAL_TABLET | Freq: Every day | ORAL | 1 refills | Status: DC

## 2022-08-21 NOTE — Patient Instructions (Signed)
  A referral was placed today for home health.  Please let us know if you have not heard back within 2 weeks about the referral.  Stop by the lab prior to leaving today. I will notify you of your results once received.    Regards,   Mort Sawyers FNP-C

## 2022-08-21 NOTE — Assessment & Plan Note (Addendum)
Daughter states will make f/u appt with ophthalmology , advised her to f/u more urgently for pupil dilation  Swelling improving Did advise if any blurry vision or change in mental status go to er immediately

## 2022-08-21 NOTE — Assessment & Plan Note (Signed)
Increased risk for falls with weakness due to pain right sided/hip/leg/ankles  Referral placed for physical therapy to eval Carmen [name]

## 2022-08-21 NOTE — Assessment & Plan Note (Signed)
Elevated.  Tsh ordered today to see if improvement, pending results, for now continue current dose levothyroxine 125 mcg once daily

## 2022-08-21 NOTE — Progress Notes (Signed)
Established Patient Office Visit  Subjective:  Patient ID: Carmen [name], female    DOB: 05-25-1941  Age: 81 y.o. MRN: [id number]  CC:  Chief Complaint  Patient presents with   Follow-up    HPI Carmen [name] is here today for follow up.   Recent fall 11/17, had diarrhea at home, and slipped in her diarrhea landing on the floor. She has bruising on her right side of her face, swelling of the right sideof her eye, that is improving however still painful. She is using her equipment at home which is a change for her, she is using canes wen walking out in public and walker at home. She also has Rolator that she uses with seat. She does have ongoing pain especially on right side with the right hip pain which causes weakness when she is walking due to the pain.   Periorbital hematoma of right yet, not yet scheduled for f/u with eye doctor however daughter with her who states she sees Dr. Matilde Sprang at eye center the will cal to schedule a f/u appt with them.   ER: went to Er, Ct cervical spine, CT head, Ct maxillofacial, xray femur right, right foot, rihgt hip and right leg. Right foot severe OA talonavicular joint. Right tibia fibula xray mild nonspecific edema lower leg. Xray right hip, no acute fracture or dislocation, mild degenerative changes bil hips. Ct head stable. Ct maxillofacial, large periorbital soft tissue hematoma right orbit. Chronic fracture right nasal bone. Ct cervical spine: mild to moderate spinal canal stenosis c3-c4 and c5-c6.   Hypothyroid: increased to 125 mcg once daily.    Past Medical History:  Diagnosis Date   Abnormal CT scan, lumbar spine (05/11/2021) 05/17/2021   (05/11/2021) LUMBAR CT FINDINGS: Alignment: Lumbar levocurvature, apex L4. Mild lateral listhesis L4 on L5 of approximately 3 mm. Vertebrae: Remote appearing superior endplate deformities at T12 with 10% height loss and L1 with up to 20% height loss. Multilevel discogenic and facet degenerative changes. Mild  bilateral SI joint arthrosis.  DISC LEVELS: T11-T12: Near complete disc height loss with de   Abnormal MRI, cervical spine (05/12/2021) 05/17/2021   (05/12/2021) CERVICAL MRI FINDINGS: Motion artifact is present. Posterior Fossa, vertebral arteries, paraspinal tissues: Left superior cerebellar infarct.   DISC LEVELS: C2-C3: Disc bulge with endplate osteophytes. Uncovertebral and facet hypertrophy. C3-C4: Disc bulge with endplate osteophytes. Uncovertebral and facet hypertrophy. Mild canal stenosis. Marked foraminal stenosis. C4-C5: Disc bulge w   Allergy    Anemia    Anxiety    Arthritis    Back pain    Coronary artery disease    Depression    History of stroke 10/02/2020   Hypertension    Peripheral vascular disease ([place])    Personal history of nicotine dependence 10/02/2020   Prsnl hx of TIA (TIA), and cereb infrc w/o resid deficits 10/02/2020   Stroke [facility])    Thyroid disease     Past Surgical History:  Procedure Laterality Date   back injections     CARDIAC CATHETERIZATION     CATARACT EXTRACTION     CORONARY ANGIOPLASTY     CORONARY ARTERY BYPASS GRAFT     2005    Family History  Problem Relation Age of Onset   Breast cancer Mother    Heart attack Father    Bipolar disorder Daughter    Diabetes Mellitus II Neg Hx     Social History   Socioeconomic History   Marital status: Widowed    Spouse  name: Not on file   Number of children: 3   Years of education: Not on file   Highest education level: Not on file  Occupational History   Not on file  Tobacco Use   Smoking status: Former    Years: 30.00    Types: Cigarettes    Quit date: 05/2020    Years since quitting: 2.3   Smokeless tobacco: Never   Tobacco comments:    Smoked about 1 pack per month  Vaping Use   Vaping Use: Never used  Substance and Sexual Activity   Alcohol use: Not Currently   Drug use: Yes    Types: Hydrocodone   Sexual activity: Not Currently  Other Topics Concern   Not on file  Social History  Narrative   ** Merged History Encounter **       Social Determinants of Health   Financial Resource Strain: Low Risk  (08/14/2022)   Overall Financial Resource Strain (CARDIA)    Difficulty of Paying Living Expenses: Not hard at all  Food Insecurity: No Food Insecurity (08/14/2022)   Hunger Vital Sign    Worried About Running Out of Food in the Last Year: Never true    [place] in the Last Year: Never true  Transportation Needs: No Transportation Needs (08/14/2022)   PRAPARE - Hydrologist (Medical): No    Lack of Transportation (Non-Medical): No  Physical Activity: Insufficiently Active (08/14/2022)   Exercise Vital Sign    Days of Exercise per Week: 7 days    Minutes of Exercise per Session: 20 min  Stress: No Stress Concern Present (08/14/2022)   [place]    Feeling of Stress : Not at all  Social Connections: Not on file  Intimate Partner Violence: Not on file    Outpatient Medications Prior to Visit  Medication Sig Dispense Refill   aspirin EC 81 MG tablet Take 81 mg by mouth daily. Swallow whole.     cetirizine (ZYRTEC) 10 MG tablet Take 10 mg by mouth daily.     cholecalciferol (VITAMIN D3) 25 MCG (1000 UNIT) tablet Take 1,000 Units by mouth daily.     Cyanocobalamin (VITAMIN B 12 PO) Take by mouth.     ELIQUIS 5 MG TABS tablet NEW PRESCRIPTION REQUEST: Eliquis 5 Mg TAKE ONE TABLET BY MOUTH TWICE DAILY 180 tablet 3   hydrochlorothiazide (HYDRODIURIL) 25 MG tablet TAKE ONE TABLET BY MOUTH DAILY 90 tablet 0   HYDROcodone-acetaminophen (NORCO/VICODIN) 5-325 MG tablet Take 1 tablet by mouth 2 (two) times daily as needed for severe pain. Must last 30 days. 60 tablet 0   [START ON 08/28/2022] HYDROcodone-acetaminophen (NORCO/VICODIN) 5-325 MG tablet Take 1 tablet by mouth 2 (two) times daily as needed for severe pain. Must last 30 days. 60 tablet 0   [START ON 09/27/2022]  HYDROcodone-acetaminophen (NORCO/VICODIN) 5-325 MG tablet Take 1 tablet by mouth 2 (two) times daily as needed for severe pain. Must last 30 days. 60 tablet 0   levothyroxine (SYNTHROID) 125 MCG tablet Take 1 tablet (125 mcg total) by mouth daily. 30 tablet 2   lidocaine (LIDODERM) 5 % Place 1 patch onto the skin daily. Remove & Discard patch within 12 hours or as directed by MD 30 patch 0   metoprolol succinate (TOPROL-XL) 100 MG 24 hr tablet TAKE 1/2 TABLET BY MOUTH AT BEDTIME 45 tablet 0   traZODone (DESYREL) 100 MG tablet Take 1 tablet (  100 mg total) by mouth at bedtime as needed for sleep. for sleep 90 tablet 1   atorvastatin (LIPITOR) 40 MG tablet TAKE ONE TABLET BY MOUTH AT BEDTIME (Patient taking differently: Take 125 mg by mouth at bedtime.) 90 tablet 0   CHOLECALCIFEROL PO Take 1 capsule by mouth daily.     magnesium oxide (MAG-OX) 400 (240 Mg) MG tablet Take 400 mg by mouth daily.     calcium carbonate (OSCAL) 1500 (600 Ca) MG TABS tablet Take 1 tablet (1,500 mg total) by mouth 2 (two) times daily with a meal. 60 tablet 2   divalproex (DEPAKOTE) 500 MG DR tablet Take 1 tablet (500 mg total) by mouth at bedtime. 30 tablet 0   ergocalciferol (VITAMIN D2) 1.25 MG (50000 UT) capsule Take 50,000 Units by mouth once a week.     pyridOXINE (VITAMIN B6) 50 MG tablet Take 50 mg by mouth daily.     No facility-administered medications prior to visit.    No Known Allergies        Objective:    Physical Exam Constitutional:      General: She is not in acute distress.    Appearance: Normal appearance. She is obese. She is not ill-appearing, toxic-appearing or diaphoretic.  Eyes:     General: Vision grossly intact. Gaze aligned appropriately.     Conjunctiva/sclera:     Right eye: Right conjunctiva is injected.     Comments: Right pupil dilated slightly more on right side   Cardiovascular:     Rate and Rhythm: Normal rate and regular rhythm.  Pulmonary:     Effort: Pulmonary effort  is normal.     Breath sounds: Normal breath sounds.  Skin:    Findings: Bruising, ecchymosis (right upper eye down to base of right chin, right sided prominence) and signs of injury (right side of face with her eye orbital area) present. Papular rash: mild edema surrounding right eye with improvement.     Comments: Bruising   Neurological:     General: No focal deficit present.     Mental Status: She is alert and oriented to person, place, and time. Mental status is at baseline.  Psychiatric:        Mood and Affect: Mood normal.        Behavior: Behavior normal.        Thought Content: Thought content normal.        Judgment: Judgment normal.         BP 130/72   Pulse 86   Temp 98.2 F (36.8 C)   Resp 16   Ht 5' (1.524 m)   Wt 189 lb 4 oz (85.8 kg)   SpO2 98%   BMI 36.96 kg/m  Wt Readings from Last 3 Encounters:  08/21/22 189 lb 4 oz (85.8 kg)  08/17/22 193 lb (87.5 kg)  08/14/22 187 lb (84.8 kg)     Health Maintenance Due  Topic Date Due   COVID-19 Vaccine (1) Never done   Zoster Vaccines- Shingrix (1 of 2) Never done   Pneumonia Vaccine 22+ Years old (1 - PCV) Never done   DEXA SCAN  Never done    There are no preventive care reminders to display for this patient.  Lab Results  Component Value Date   TSH 10.68 (H) 07/27/2022   Lab Results  Component Value Date   WBC 8.5 08/18/2022   HGB 13.0 08/18/2022   HCT 39.5 08/18/2022   MCV 89.6 08/18/2022  PLT 218 08/18/2022   Lab Results  Component Value Date   NA 140 08/18/2022   K 3.8 08/18/2022   CO2 28 08/18/2022   GLUCOSE 116 (H) 08/18/2022   BUN 19 08/18/2022   CREATININE 0.86 08/18/2022   BILITOT 0.7 05/08/2022   ALKPHOS 42 05/08/2022   AST 26 05/08/2022   ALT 20 05/08/2022   PROT 7.0 05/08/2022   ALBUMIN 4.3 05/08/2022   CALCIUM 9.5 08/18/2022   ANIONGAP 7 08/18/2022   EGFR 71 01/11/2022   GFR 67.85 10/18/2021   Lab Results  Component Value Date   CHOL 156 05/09/2022   Lab Results   Component Value Date   HDL 48 05/09/2022   Lab Results  Component Value Date   LDLCALC 83 05/09/2022   Lab Results  Component Value Date   TRIG 125 05/09/2022   Lab Results  Component Value Date   CHOLHDL 3.3 05/09/2022   Lab Results  Component Value Date   HGBA1C 6.3 (H) 05/08/2022      Assessment & Plan:   Problem List Items Addressed This Visit       Endocrine   Acquired hypothyroidism    Elevated.  Tsh ordered today to see if improvement, pending results, for now continue current dose levothyroxine 125 mcg once daily       Relevant Orders   TSH     Other   Chronic pain syndrome - Primary (Chronic)   Relevant Medications   divalproex (DEPAKOTE) 500 MG DR tablet   Other Relevant Orders   Ambulatory referral to Physical Therapy   Hyperlipidemia LDL goal <70   Relevant Medications   atorvastatin (LIPITOR) 40 MG tablet   At risk for falls    D/w daughter and patient possible need for assisted living, they are looking into this as well but will try with PT first as daughter does state falls have decreased since pt is using her aids in the home as indicated       History of stroke   Relevant Orders   Ambulatory referral to Physical Therapy   Unsteady gait when walking    Increased risk for falls with weakness due to pain right sided/hip/leg/ankles  Referral placed for physical therapy to eval /treat       Relevant Orders   Ambulatory referral to Physical Therapy   Multiple falls   Relevant Orders   Ambulatory referral to Physical Therapy   Stroke-like symptoms vs. seizure like activity     Refill depakote 500 mg once daily Advised pt to f/u with neurology to discuss this to make sure current dose is adequate  Pt tolerating much better this lower dose.       Relevant Medications   divalproex (DEPAKOTE) 500 MG DR tablet   Periorbital hematoma of right eye    Daughter states will make f/u appt with ophthalmology , advised her to f/u more urgently for  pupil dilation  Swelling improving Did advise if any blurry vision or change in mental status go to er immediately       Dilated right pupil due to trauma    Meds ordered this encounter  Medications   divalproex (DEPAKOTE) 500 MG DR tablet    Sig: Take 1 tablet (500 mg total) by mouth at bedtime.    Dispense:  90 tablet    Refill:  0   atorvastatin (LIPITOR) 40 MG tablet    Sig: Take 1 tablet (40 mg total) by mouth at bedtime.  Dispense:  90 tablet    Refill:  0    Order Specific Question:   Supervising Provider    Answer:   Diona Browner, AMY E [[id number]]    Follow-up: Return in about 3 months (around 11/21/2022).    Eugenia Pancoast, FNP

## 2022-08-21 NOTE — Assessment & Plan Note (Signed)
Refill depakote 500 mg once daily Advised pt to f/u with neurology to discuss this to make sure current dose is adequate  Pt tolerating much better this lower dose.

## 2022-08-21 NOTE — Assessment & Plan Note (Signed)
D/w daughter and patient possible need for assisted living, they are looking into this as well but will try with PT first as daughter does state falls have decreased since pt is using her aids in the home as indicated

## 2022-08-22 ENCOUNTER — Ambulatory Visit: Payer: Medicare Other | Attending: Pain Medicine | Admitting: Pain Medicine

## 2022-08-22 ENCOUNTER — Encounter: Payer: Self-pay | Admitting: Pain Medicine

## 2022-08-22 ENCOUNTER — Ambulatory Visit
Admission: RE | Admit: 2022-08-22 | Discharge: 2022-08-22 | Disposition: A | Discharge status: Home / Self Care | Payer: Medicare Other | Source: Ambulatory Visit | Attending: Pain Medicine | Admitting: Pain Medicine

## 2022-08-22 VITALS — BP 170/104 | HR 87 | Temp 96.8°F | Resp 15 | Ht 60.0 in | Wt 187.0 lb

## 2022-08-22 DIAGNOSIS — Z7901 Long term (current) use of anticoagulants: Secondary | ICD-10-CM | POA: Insufficient documentation

## 2022-08-22 DIAGNOSIS — G8929 Other chronic pain: Secondary | ICD-10-CM | POA: Diagnosis not present

## 2022-08-22 DIAGNOSIS — M7061 Trochanteric bursitis, right hip: Secondary | ICD-10-CM | POA: Insufficient documentation

## 2022-08-22 DIAGNOSIS — M25551 Pain in right hip: Secondary | ICD-10-CM | POA: Insufficient documentation

## 2022-08-22 DIAGNOSIS — M1611 Unilateral primary osteoarthritis, right hip: Secondary | ICD-10-CM | POA: Diagnosis not present

## 2022-08-22 MED ORDER — ROPIVACAINE HCL 2 MG/ML IJ SOLN
9.0000 mL | Freq: Once | INTRAMUSCULAR | Status: AC
  Administered 2022-08-22: 9 mL via INTRA_ARTICULAR

## 2022-08-22 MED ORDER — LIDOCAINE HCL 2 % IJ SOLN
INTRAMUSCULAR | Status: AC
  Filled 2022-08-22: qty 20

## 2022-08-22 MED ORDER — MIDAZOLAM HCL 5 MG/5ML IJ SOLN
0.5000 mg | Freq: Once | INTRAMUSCULAR | Status: AC
  Administered 2022-08-22: 1.5 mg via INTRAVENOUS
  Filled 2022-08-22: qty 2

## 2022-08-22 MED ORDER — IOHEXOL 180 MG/ML  SOLN
INTRAMUSCULAR | Status: AC
  Filled 2022-08-22: qty 20

## 2022-08-22 MED ORDER — LIDOCAINE HCL 2 % IJ SOLN
20.0000 mL | Freq: Once | INTRAMUSCULAR | Status: AC
  Administered 2022-08-22: 400 mg

## 2022-08-22 MED ORDER — MIDAZOLAM HCL 2 MG/2ML IJ SOLN
INTRAMUSCULAR | Status: AC
  Filled 2022-08-22: qty 2

## 2022-08-22 MED ORDER — METHYLPREDNISOLONE ACETATE 80 MG/ML IJ SUSP
INTRAMUSCULAR | Status: AC
  Filled 2022-08-22: qty 1

## 2022-08-22 MED ORDER — LACTATED RINGERS IV SOLN: Freq: Once | INTRAVENOUS | Status: AC

## 2022-08-22 MED ORDER — IOHEXOL 180 MG/ML  SOLN
10.0000 mL | Freq: Once | INTRAMUSCULAR | Status: AC
  Administered 2022-08-22: 10 mL via INTRA_ARTICULAR

## 2022-08-22 MED ORDER — METHYLPREDNISOLONE ACETATE 80 MG/ML IJ SUSP
80.0000 mg | Freq: Once | INTRAMUSCULAR | Status: AC
  Administered 2022-08-22: 80 mg via INTRA_ARTICULAR

## 2022-08-22 MED ORDER — FENTANYL CITRATE (PF) 100 MCG/2ML IJ SOLN: 25.0000 ug | INTRAMUSCULAR | Status: DC | PRN

## 2022-08-22 MED ORDER — ROPIVACAINE HCL 2 MG/ML IJ SOLN
INTRAMUSCULAR | Status: AC
  Filled 2022-08-22: qty 20

## 2022-08-22 MED ORDER — PENTAFLUOROPROP-TETRAFLUOROETH EX AERO
INHALATION_SPRAY | Freq: Once | CUTANEOUS | Status: DC
  Filled 2022-08-22: qty 116

## 2022-08-22 NOTE — Patient Instructions (Signed)
____________________________________________________________________________________________  Virtual Visits   What is a "Virtual Visit"? It is a healthcare communication encounter (medical visit) that takes place on real time (NOT TEXT or E-MAIL) over the telephone or computer device (desktop, laptop, tablet, smart phone, etc.). It allows for more location flexibility between the patient and the healthcare provider.  Who decides when these types of visits will be used? The physician.  Who is eligible for these types of visits? Only those patients that can be reliably reached over the telephone.  What do you mean by reliably? We do not have time to call everyone multiple times, therefore those that tend to screen calls and then call back later are not suitable candidates for this system. We understand how people are reluctant to pickup on "unknown" calls, therefore, we suggest adding our telephone numbers to your list of "CONTACT(s)". This way, you should be able to readily identify our calls when you receive one. All of our numbers are available below.   Who is not eligible? This option is not available for medication management encounters, specially for controlled substances. Patients on pain medications that fall under the category of controlled substances have to come in for "Face-to-Face" encounters. This is required for mandatory monitoring of these substances. You may be asked to provide a sample for an unannounced urine drug screening test (UDS), and we will need to count your pain pills. Not bringing your pills to be counted may result in no refill. Obviously, neither one of these can be done over the phone.  When will this type of visits be used? You can request a virtual visit whenever you are physically unable to attend a regular appointment. The decision will be made by the physician (or healthcare provider) on a case by case basis.   At what time will I be called? This is an  excellent question. The providers will try to call you whenever they have time available. Do not expect to be called at any specific time. The secretaries will assign you a time for your virtual visit appointment, but this is done simply to keep a list of those patients that need to be called, but not for the purpose of keeping a time schedule. Be advised that the call may come in anytime during the day, between the hours of 8:00 AM and 8::00 PM, depending on provider availability. We do understand that the system is not perfect. If you are unable to be available that day on a moments notice, then request an "in-person" appointment rather than a "virtual visit".  Can I request my medication visits to be "Virtual"? Yes you may request it, but the decision is entirely up to the healthcare provider. Control substances require specific monitoring that requires Face-to-Face encounters. The number of encounters  and the extent of the monitoring is determined on a case by case basis.  Add a new contact to your smart phone and label it "PAIN CLINIC" Under this contact add the following numbers: Main: [phone] (Official Contact Number) Nurses: [phone] (These are outgoing only calling systems. Do not call this number.) Dr. Carissa Musick: [phone] or [phone] (Outgoing calls only. Do not call this number.)  ____________________________________________________________________________________________    ____________________________________________________________________________________________  Post-Procedure Discharge Instructions  Instructions: Apply ice:  Purpose: This will minimize any swelling and discomfort after procedure.  When: Day of procedure, as soon as you get home. How: Fill a plastic sandwich bag with crushed ice. Cover it with a small towel and apply   to injection site. How long: (15 min on, 15 min off) Apply for 15 minutes then remove x 15 minutes.  Repeat sequence on  day of procedure, until you go to bed. Apply heat:  Purpose: To treat any soreness and discomfort from the procedure. When: Starting the next day after the procedure. How: Apply heat to procedure site starting the day following the procedure. How long: May continue to repeat daily, until discomfort goes away. Food intake: Start with clear liquids (like water) and advance to regular food, as tolerated.  Physical activities: Keep activities to a minimum for the first 8 hours after the procedure. After that, then as tolerated. Driving: If you have received any sedation, be responsible and do not drive. You are not allowed to drive for 24 hours after having sedation. Blood thinner: (Applies only to those taking blood thinners) You may restart your blood thinner 6 hours after your procedure. Insulin: (Applies only to Diabetic patients taking insulin) As soon as you can eat, you may resume your normal dosing schedule. Infection prevention: Keep procedure site clean and dry. Shower daily and clean area with soap and water. Post-procedure Pain Diary: Extremely important that this be done correctly and accurately. Recorded information will be used to determine the next step in treatment. For the purpose of accuracy, follow these rules: Evaluate only the area treated. Do not report or include pain from an untreated area. For the purpose of this evaluation, ignore all other areas of pain, except for the treated area. After your procedure, avoid taking a long nap and attempting to complete the pain diary after you wake up. Instead, set your alarm clock to go off every hour, on the hour, for the initial 8 hours after the procedure. Document the duration of the numbing medicine, and the relief you are getting from it. Do not go to sleep and attempt to complete it later. It will not be accurate. If you received sedation, it is likely that you were given a medication that may cause amnesia. Because of this,  completing the diary at a later time may cause the information to be inaccurate. This information is needed to plan your care. Follow-up appointment: Keep your post-procedure follow-up evaluation appointment after the procedure (usually 2 weeks for most procedures, 6 weeks for radiofrequencies). DO NOT FORGET to bring you pain diary with you.   Expect: (What should I expect to see with my procedure?) From numbing medicine (AKA: Local Anesthetics): Numbness or decrease in pain. You may also experience some weakness, which if present, could last for the duration of the local anesthetic. Onset: Full effect within 15 minutes of injected. Duration: It will depend on the type of local anesthetic used. On the average, 1 to 8 hours.  From steroids (Applies only if steroids were used): Decrease in swelling or inflammation. Once inflammation is improved, relief of the pain will follow. Onset of benefits: Depends on the amount of swelling present. The more swelling, the longer it will take for the benefits to be seen. In some cases, up to 10 days. Duration: Steroids will stay in the system x 2 weeks. Duration of benefits will depend on multiple posibilities including persistent irritating factors. Side-effects: If present, they may typically last 2 weeks (the duration of the steroids). Frequent: Cramps (if they occur, drink Gatorade and take over-the-counter Magnesium 450-500 mg once to twice a day); water retention with temporary weight gain; increases in blood sugar; decreased immune system response; increased appetite. Occasional: Facial flushing (  red, warm cheeks); mood swings; menstrual changes. Uncommon: Long-term decrease or suppression of natural hormones; bone thinning. (These are more common with higher doses or more frequent use. This is why we prefer that our patients avoid having any injection therapies in other practices.)  Very Rare: Severe mood changes; psychosis; aseptic necrosis. From  procedure: Some discomfort is to be expected once the numbing medicine wears off. This should be minimal if ice and heat are applied as instructed.  Call if: (When should I call?) You experience numbness and weakness that gets worse with time, as opposed to wearing off. New onset bowel or bladder incontinence. (Applies only to procedures done in the spine)  Emergency Numbers: Durning business hours (Monday - Thursday, 8:00 AM - 4:00 PM) (Friday, 9:00 AM - 12:00 Noon): [phone] After hours: [phone] NOTE: If you are having a problem and are unable connect with, or to talk to a provider, then go to your nearest urgent care or emergency department. If the problem is serious and urgent, please call 911. ____________________________________________________________________________________________    

## 2022-08-22 NOTE — Progress Notes (Signed)
PROVIDER NOTE: Interpretation of information contained herein should be left to medically-trained personnel. Specific patient instructions are provided elsewhere under "Patient Instructions" section of medical record. This document was created in part using STT-dictation technology, any transcriptional errors that may result from this process are unintentional.  Patient: Carmen [name] Type: Established DOB: [birth year] MRN: [id number] PCP: Mort Sawyers, FNP  Service: Procedure DOS: 08/22/2022 Setting: Ambulatory Location: Ambulatory outpatient facility Delivery: Face-to-face Provider: Oswaldo Done, MD Specialty: Interventional Pain Management Specialty designation: 09 Location: Outpatient facility Ref. Prov.: Delano Metz, MD    Primary Reason for Visit: Interventional Pain Management Treatment. CC: Hip Pain (right)  Procedure:               Type:  Intra-articular hip & peri-articular bursae injection #2  Laterality: Right (-RT)  Level: Lower pelvic and hip joint level.  Imaging: Fluoroscopy-guided         Anesthesia: Local anesthesia (1-2% Lidocaine) Anxiolysis: None                 Sedation: Moderate Sedation                       DOS: 08/22/2022  Performed by: Oswaldo Done, MD  Purpose: Diagnostic/Therapeutic Indications: Hip pain severe enough to impact quality of life or function. Rationale (medical necessity): procedure needed and proper for the diagnosis and/or treatment of Carmen [name]'s medical symptoms and needs. 1. Chronic hip pain (Right)   2. Greater trochanteric bursitis (Right)   3. Osteoarthritis of hip (Right)   4. Chronic anticoagulation (Eliquis)    NAS-11 Pain score:   Pre-procedure: 6 /10   Post-procedure: 0-No pain/10      Intra-articular Target: Superior aspect of the hip joint cavity, going thru the superior portion of the capsular ligament.  Bursae Target: Trochanteric Bursa Location: Intra-articular & peri-articular Region: Hip  joint, upper (proximal) femoral region Approach: Percutaneous lateral approach. Type of procedure: Percutaneous joint injection   Position / Prep / Materials:  Position: Lateral Decubitus with affected side up  Prep solution: DuraPrep (Iodine Povacrylex [0.7% available iodine] and Isopropyl Alcohol, 74% w/w) Prep Area:  Entire Posterolateral hip area. Materials:  Tray: Block tray Needle(s):  Type: Spinal  Gauge (G): 22  Length: 7-in  Qty: 1  Pre-op H&P Assessment:  Carmen [name] is a 81 y.o. (year old), female patient, seen today for interventional treatment. She  has a past surgical history that includes Cardiac catheterization; Coronary angioplasty; Coronary artery bypass graft; back injections; and Cataract extraction. Carmen [name] has a current medication list which includes the following prescription(s): aspirin ec, atorvastatin, cetirizine, cholecalciferol, cyanocobalamin, divalproex, eliquis, hydrochlorothiazide, [START ON 08/28/2022] hydrocodone-acetaminophen, [START ON 09/27/2022] hydrocodone-acetaminophen, levothyroxine, lidocaine, magnesium oxide, metoprolol succinate, trazodone, and hydrocodone-acetaminophen, and the following Facility-Administered Medications: fentanyl and pentafluoroprop-tetrafluoroeth. Her primarily concern today is the Hip Pain (right)  Initial Vital Signs:  Pulse/HCG Rate: 87ECG Heart Rate: 95 Temp: (!) 96.8 F (36 C) Resp: 16 BP: (!) 161/106 SpO2: 99 %  BMI: Estimated body mass index is 36.52 kg/m as calculated from the following:   Height as of this encounter: 5' (1.524 m).   Weight as of this encounter: 187 lb (84.8 kg).  Risk Assessment: Allergies: Reviewed. She has No Known Allergies.  Allergy Precautions: None required Coagulopathies: Reviewed. None identified.  Blood-thinner therapy: None at this time Active Infection(s): Reviewed. None identified. Carmen [name] is afebrile  Site Confirmation: Carmen [name] was asked to confirm the procedure and  laterality before  marking the site Procedure checklist: Completed Consent: Before the procedure and under the influence of no sedative(s), amnesic(s), or anxiolytics, the patient was informed of the treatment options, risks and possible complications. To fulfill our ethical and legal obligations, as recommended by the American Medical Association's Code of Ethics, I have informed the patient of my clinical impression; the nature and purpose of the treatment or procedure; the risks, benefits, and possible complications of the intervention; the alternatives, including doing nothing; the risk(s) and benefit(s) of the alternative treatment(s) or procedure(s); and the risk(s) and benefit(s) of doing nothing. The patient was provided information about the general risks and possible complications associated with the procedure. These may include, but are not limited to: failure to achieve desired goals, infection, bleeding, organ or nerve damage, allergic reactions, paralysis, and death. In addition, the patient was informed of those risks and complications associated to the procedure, such as failure to decrease pain; infection; bleeding; organ or nerve damage with subsequent damage to sensory, motor, and/or autonomic systems, resulting in permanent pain, numbness, and/or weakness of one or several areas of the body; allergic reactions; (i.e.: anaphylactic reaction); and/or death. Furthermore, the patient was informed of those risks and complications associated with the medications. These include, but are not limited to: allergic reactions (i.e.: anaphylactic or anaphylactoid reaction(s)); adrenal axis suppression; blood sugar elevation that in diabetics may result in ketoacidosis or comma; water retention that in patients with history of congestive heart failure may result in shortness of breath, pulmonary edema, and decompensation with resultant heart failure; weight gain; swelling or edema; medication-induced  neural toxicity; particulate matter embolism and blood vessel occlusion with resultant organ, and/or nervous system infarction; and/or aseptic necrosis of one or more joints. Finally, the patient was informed that Medicine is not an exact science; therefore, there is also the possibility of unforeseen or unpredictable risks and/or possible complications that may result in a catastrophic outcome. The patient indicated having understood very clearly. We have given the patient no guarantees and we have made no promises. Enough time was given to the patient to ask questions, all of which were answered to the patient's satisfaction. Ms. Brodersen has indicated that she wanted to continue with the procedure. Attestation: I, the ordering provider, attest that I have discussed with the patient the benefits, risks, side-effects, alternatives, likelihood of achieving goals, and potential problems during recovery for the procedure that I have provided informed consent. Date  Time: 08/22/2022  9:18 AM  Pre-Procedure Preparation:  Monitoring: As per clinic protocol. Respiration, ETCO2, SpO2, BP, heart rate and rhythm monitor placed and checked for adequate function Safety Precautions: Patient was assessed for positional comfort and pressure points before starting the procedure. Time-out: I initiated and conducted the "Time-out" before starting the procedure, as per protocol. The patient was asked to participate by confirming the accuracy of the "Time Out" information. Verification of the correct person, site, and procedure were performed and confirmed by me, the nursing staff, and the patient. "Time-out" conducted as per Joint Commission's Universal Protocol (UP.01.01.01). Time: 1034  Description/Narrative of Procedure:          Rationale (medical necessity): procedure needed and proper for the diagnosis and/or treatment of the patient's medical symptoms and needs. Procedural Technique Safety Precautions: Aspiration  looking for blood return was conducted prior to all injections. At no point did we inject any substances, as a needle was being advanced. No attempts were made at seeking any paresthesias. Safe injection practices and needle disposal techniques used.  Medications properly checked for expiration dates. SDV (single dose vial) medications used. Description of the Procedure: Protocol guidelines were followed. The patient was assisted into a comfortable position. The target area was identified and the area prepped in the usual manner. Skin & deeper tissues infiltrated with local anesthetic. Appropriate amount of time allowed to pass for local anesthetics to take effect. The procedure needles were then advanced to the target area. Proper needle placement secured. Negative aspiration confirmed. Solution injected in intermittent fashion, asking for systemic symptoms every 0.5cc of injectate. The needles were then removed and the area cleansed, making sure to leave some of the prepping solution back to take advantage of its long term bactericidal properties.  Technical description of procedure:  Skin & deeper tissues infiltrated with local anesthetic. Appropriate amount of time allowed to pass for local anesthetics to take effect. The procedure needles were then advanced to the target area. Proper needle placement secured. Negative aspiration confirmed. Solution injected in intermittent fashion, asking for systemic symptoms every 0.5cc of injectate. The needles were then removed and the area cleansed, making sure to leave some of the prepping solution back to take advantage of its long term bactericidal properties.             Vitals:   08/22/22 1026 08/22/22 1036 08/22/22 1040 08/22/22 1053  BP: (!) 174/89 (!) 169/88 (!) 143/107 (!) 170/104  Pulse:      Resp: (!) Temp:      TempSrc:      SpO2: 95% 98%  99%  Weight:      Height:         Start Time: 1034 hrs. End Time: 1040  hrs.  Imaging Guidance (Non-Spinal):          Type of Imaging Technique: Fluoroscopy Guidance (Non-Spinal) Indication(s): Assistance in needle guidance and placement for procedures requiring needle placement in or near specific anatomical locations not easily accessible without such assistance. Exposure Time: Please see nurses notes. Contrast: Before injecting any contrast, we confirmed that the patient did not have an allergy to iodine, shellfish, or radiological contrast. Once satisfactory needle placement was completed at the desired level, radiological contrast was injected. Contrast injected under live fluoroscopy. No contrast complications. See chart for type and volume of contrast used. Fluoroscopic Guidance: I was personally present during the use of fluoroscopy. "Tunnel Vision Technique" used to obtain the best possible view of the target area. Parallax error corrected before commencing the procedure. "Direction-depth-direction" technique used to introduce the needle under continuous pulsed fluoroscopy. Once target was reached, antero-posterior, oblique, and lateral fluoroscopic projection used confirm needle placement in all planes. Images permanently stored in EMR. Interpretation: I personally interpreted the imaging intraoperatively. Adequate needle placement confirmed in multiple planes. Appropriate spread of contrast into desired area was observed. No evidence of afferent or efferent intravascular uptake. Permanent images saved into the patient's record.  Post-operative Assessment:  Post-procedure Vital Signs:  Pulse/HCG Rate: 8795 Temp: (!) 96.8 F (36 C) Resp: 15 BP: (!) 170/104 SpO2: 99 %  EBL: None  Complications: No immediate post-treatment complications observed by team, or reported by patient.  Note: The patient tolerated the entire procedure well. A repeat set of vitals were taken after the procedure and the patient was kept under observation following institutional  policy, for this type of procedure. Post-procedural neurological assessment was performed, showing return to baseline, prior to discharge. The patient was provided with post-procedure discharge instructions, including a section on how  to identify potential problems. Should any problems arise concerning this procedure, the patient was given instructions to immediately contact us, at any time, without hesitation. In any case, we plan to contact the patient by telephone for a follow-up status report regarding this interventional procedure.  Comments:  No additional relevant information.  Plan of Care  Orders:  Orders Placed This Encounter  Procedures   HIP INJECTION    Scheduling Instructions:     Side: Right-sided     Sedation: Patient's choice.     Timeframe: Today   DG PAIN [facility] C-ARM 1-60 MIN NO REPORT    Intraoperative interpretation by procedural physician at [facility].    Standing Status:   Standing    Number of Occurrences:   1    Order Specific Question:   Reason for exam:    Answer:   Assistance in needle guidance and placement for procedures requiring needle placement in or near specific anatomical locations not easily accessible without such assistance.   Informed Consent Details: Physician/Practitioner Attestation; Transcribe to consent form and obtain patient signature    Nursing Order: Transcribe to consent form and obtain patient signature. Note: Always confirm laterality of pain with Ms. Eddleman, before procedure.    Order Specific Question:   Physician/Practitioner attestation of informed consent for procedure/surgical case    Answer:   I, the physician/practitioner, attest that I have discussed with the patient the benefits, risks, side effects, alternatives, likelihood of achieving goals and potential problems during recovery for the procedure that I have provided informed consent.    Order Specific Question:   Procedure    Answer:   Hip injection    Order  Specific Question:   Physician/Practitioner performing the procedure    Answer:   Kadrian Partch A. Laban EmperorNaveira, MD    Order Specific Question:   Indication/Reason    Answer:   Hip Joint Pain (Arthralgia)   Care order/instruction: Please confirm that the patient has stopped the Eliquis (Apixaban) x 3 days prior to procedure or surgery.    Please confirm that the patient has stopped the Eliquis (Apixaban) x 3 days prior to procedure or surgery.    Standing Status:   Standing    Number of Occurrences:   1   Provide equipment / supplies at bedside    "Block Tray" (Disposable  single use) Needle type: SpinalSpinal Amount/quantity: 1 Size: Long (7-inch) Gauge: 22G    Standing Status:   Standing    Number of Occurrences:   1    Order Specific Question:   Specify    Answer:   Block Tray   Bleeding precautions    Standing Status:   Standing    Number of Occurrences:   1   Chronic Opioid Analgesic:  Hydrocodone/APAP 5/325, 1 tab p.o. BID (10 mg/day of hydrocodone) (10 MME) (last filled on 05/02/2021) MME/day: 10 mg/day   Medications ordered for procedure: Meds ordered this encounter  Medications   iohexol (OMNIPAQUE) 180 MG/ML injection 10 mL    Must be Myelogram-compatible. If not available, you may substitute with a water-soluble, non-ionic, hypoallergenic, myelogram-compatible radiological contrast medium.   lidocaine (XYLOCAINE) 2 % (with pres) injection 400 mg   pentafluoroprop-tetrafluoroeth (GEBAUERS) aerosol   lactated ringers infusion   midazolam (VERSED) 5 MG/5ML injection 0.5-2 mg    Make sure Flumazenil is available in the pyxis when using this medication. If oversedation occurs, administer 0.2 mg IV over 15 sec. If after 45 sec no response, administer 0.2 mg  again over 1 min; may repeat at 1 min intervals; not to exceed 4 doses (1 mg)   fentaNYL (SUBLIMAZE) injection 25-50 mcg    Make sure Narcan is available in the pyxis when using this medication. In the event of respiratory  depression (RR< 8/min): Titrate NARCAN (naloxone) in increments of 0.1 to 0.2 mg IV at 2-3 minute intervals, until desired degree of reversal.   methylPREDNISolone acetate (DEPO-MEDROL) injection 80 mg   ropivacaine (PF) 2 mg/mL (0.2%) (NAROPIN) injection 9 mL   Medications administered: We administered iohexol, lidocaine, lactated ringers, midazolam, methylPREDNISolone acetate, and ropivacaine (PF) 2 mg/mL (0.2%).  See the medical record for exact dosing, route, and time of administration.  Follow-up plan:   Return in about 2 weeks (around 09/05/2022) for Proc-day (T,Th), (VV), (PPE).       Interventional Therapies  Risk  Complexity Considerations:   Estimated body mass index is 34.2 kg/m as calculated from the following:   Height as of this encounter: 5\' 1"  (1.549 m).   Weight as of this encounter: 181 lb (82.1 kg). ELIQUIS + ASA Anticoagulation (Stop: 3 days  Restart: 6 hours)   Planned  Pending:   Therapeutic right IA hip and TBI inj. #2    Under consideration:   Diagnostic/therapeutic cervical ESI  Therapeutic left lumbar facet RFA #1  Diagnostic/therapeutic right L4-5 LESI #1  Diagnostic/therapeutic right L4 and L5 TFESI #1    Completed:   Diagnostic/Therapeutic right IA Hip & TBI inj. x1 (06/29/22) (100/100/50/50)  Therapeutic right lumbar facet RFA x1 (06/13/2022) (100/100/100/90-100)  Diagnostic right lumbar facet MBB x3 (01/26/2022) (100/100/90/90)  Diagnostic left lumbar facet MBB x3 (01/26/2022) (100/100/90/90)  Diagnostic left cervical facet MBB x1 (06/28/2021) (25/25/0/0)    Therapeutic  Palliative (PRN) options:   Therapeutic lumbar facet MBB      Recent Visits Date Type Provider Dept  07/17/22 Office Visit 07/19/22, MD [facility]  06/29/22 Procedure visit 07/01/22, MD [facility]  06/13/22 Procedure visit 08/13/22, MD [facility]  Showing recent visits within past 90 days and meeting all other  requirements Today's Visits Date Type Provider Dept  08/22/22 Procedure visit 08/24/22, MD [facility]  Showing today's visits and meeting all other requirements Future Appointments Date Type Provider Dept  09/07/22 Appointment 14/07/23, MD [facility]  10/23/22 Appointment 10/25/22, MD [facility]  Showing future appointments within next 90 days and meeting all other requirements  Disposition: Discharge home  Discharge (Date  Time): 08/22/2022; 1056 hrs.   Primary Care Physician: 08/24/2022, FNP Location: [facility] Note by: [facility], MD Date: 08/22/2022; Time: 11:55 AM  Disclaimer:  Medicine is not an exact science. The only guarantee in medicine is that nothing is guaranteed. It is important to note that the decision to proceed with this intervention was based on the information collected from the patient. The Data and conclusions were drawn from the patient's questionnaire, the interview, and the physical examination. Because the information was provided in large part by the patient, it cannot be guaranteed that it has not been purposely or unconsciously manipulated. Every effort has been made to obtain as much relevant data as possible for this evaluation. It is important to note that the conclusions that lead to this procedure are derived in large part from the available data. Always take into account that the treatment will also be dependent on availability of resources and existing treatment guidelines,  considered by other Pain Management Practitioners as being common knowledge and practice, at the time of the intervention. For Medico-Legal purposes, it is also important to point out that variation in procedural techniques and pharmacological choices are the acceptable norm. The indications, contraindications, technique, and results of the above procedure should only be interpreted  and judged by a Board-Certified Interventional Pain Specialist with extensive familiarity and expertise in the same exact procedure and technique.

## 2022-08-23 ENCOUNTER — Telehealth: Payer: Self-pay | Admitting: *Deleted

## 2022-08-23 NOTE — Telephone Encounter (Signed)
Spoke with Sherry Ruffing, reports no problems post procedure.

## 2022-08-28 ENCOUNTER — Other Ambulatory Visit: Payer: Self-pay

## 2022-08-28 DIAGNOSIS — R2990 Unspecified symptoms and signs involving the nervous system: Secondary | ICD-10-CM

## 2022-08-28 MED ORDER — DIVALPROEX SODIUM 500 MG PO DR TAB: 500.0000 mg | DELAYED_RELEASE_TABLET | Freq: Every evening | ORAL | 0 refills | Status: DC

## 2022-09-02 ENCOUNTER — Encounter (INDEPENDENT_AMBULATORY_CARE_PROVIDER_SITE_OTHER): Payer: Self-pay | Admitting: Nurse Practitioner

## 2022-09-02 NOTE — Progress Notes (Signed)
Subjective:    Patient ID: Carmen [name], female    DOB: [birth year], 81 y.o.   MRN: [id number] Chief Complaint  Patient presents with   Establish Care    Referred by Dr Haynes Kerns Heavrin is an 81 year old female who is referred by her primary care provider Limmie Patricia, FNP regarding varicose veins with pain and swelling.  The patient notes that she has been having her right lower extremity giving out.  She recently fell on her right hip several x4 weeks and a rope.  She notes that her right hip and down her leg hurt.  She has pain with standing primarily.  She has previously had treatment on the veins in her left lower extremity.  She does note multiple varicosities bilaterally.  She tends to have more swelling in the right lower extremity.  Today noninvasive studies show no evidence of DVT or superficial thrombophlebitis bilaterally.  No evidence of deep venous insufficiency or superficial venous reflux bilaterally.    Review of Systems     Objective:   Physical Exam  BP (!) 176/111 (BP Location: Right Arm)   Pulse 86   Resp 18   Ht 5' (1.524 m)   Wt 193 lb (87.5 kg)   BMI 37.69 kg/m   Past Medical History:  Diagnosis Date   Abnormal CT scan, lumbar spine (05/11/2021) 05/17/2021   (05/11/2021) LUMBAR CT FINDINGS: Alignment: Lumbar levocurvature, apex L4. Mild lateral listhesis L4 on L5 of approximately 3 mm. Vertebrae: Remote appearing superior endplate deformities at T12 with 10% height loss and L1 with up to 20% height loss. Multilevel discogenic and facet degenerative changes. Mild bilateral SI joint arthrosis.  DISC LEVELS: T11-T12: Near complete disc height loss with de   Abnormal MRI, cervical spine (05/12/2021) 05/17/2021   (05/12/2021) CERVICAL MRI FINDINGS: Motion artifact is present. Posterior Fossa, vertebral arteries, paraspinal tissues: Left superior cerebellar infarct.   DISC LEVELS: C2-C3: Disc bulge with endplate osteophytes. Uncovertebral and facet hypertrophy.  C3-C4: Disc bulge with endplate osteophytes. Uncovertebral and facet hypertrophy. Mild canal stenosis. Marked foraminal stenosis. C4-C5: Disc bulge w   Allergy    Anemia    Anxiety    Arthritis    Back pain    Coronary artery disease    Depression    History of stroke 10/02/2020   Hypertension    Peripheral vascular disease (HCC)    Personal history of nicotine dependence 10/02/2020   Prsnl hx of TIA (TIA), and cereb infrc w/o resid deficits 10/02/2020   Stroke [facility])    Thyroid disease     Social History   Socioeconomic History   Marital status: Widowed    Spouse name: Not on file   Number of children: 3   Years of education: Not on file   Highest education level: Not on file  Occupational History   Not on file  Tobacco Use   Smoking status: Former    Years: 30.00    Types: Cigarettes    Quit date: 05/2020    Years since quitting: 2.3   Smokeless tobacco: Never   Tobacco comments:    Smoked about 1 pack per month  Vaping Use   Vaping Use: Never used  Substance and Sexual Activity   Alcohol use: Not Currently   Drug use: Yes    Types: Hydrocodone   Sexual activity: Not Currently  Other Topics Concern   Not on file  Social History Narrative   ** Merged History Encounter **  Social Determinants of Health   Financial Resource Strain: Low Risk  (08/14/2022)   Overall Financial Resource Strain (CARDIA)    Difficulty of Paying Living Expenses: Not hard at all  Food Insecurity: No Food Insecurity (08/14/2022)   Hunger Vital Sign    Worried About Running Out of Food in the Last Year: Never true    Ran Out of Food in the Last Year: Never true  Transportation Needs: No Transportation Needs (08/14/2022)   PRAPARE - Administrator, Civil Service (Medical): No    Lack of Transportation (Non-Medical): No  Physical Activity: Insufficiently Active (08/14/2022)   Exercise Vital Sign    Days of Exercise per Week: 7 days    Minutes of Exercise per Session: 20  min  Stress: No Stress Concern Present (08/14/2022)   [employer] of Occupational Health - Occupational Stress Questionnaire    Feeling of Stress : Not at all  Social Connections: Not on file  Intimate Partner Violence: Not on file    Past Surgical History:  Procedure Laterality Date   back injections     CARDIAC CATHETERIZATION     CATARACT EXTRACTION     CORONARY ANGIOPLASTY     CORONARY ARTERY BYPASS GRAFT     2005    Family History  Problem Relation Age of Onset   Breast cancer Mother    Heart attack Father    Bipolar disorder Daughter    Diabetes Mellitus II Neg Hx     No Known Allergies     Latest Ref Rng & Units 08/18/2022    8:46 AM 05/08/2022   12:10 PM 10/18/2021   11:56 AM  CBC  WBC 4.0 - 10.5 K/uL 8.5  10.3  6.0   Hemoglobin 12.0 - 15.0 g/dL 06.2  69.4    85.4  62.7   Hematocrit 36.0 - 46.0 % 39.5  41.0    39.2  37.7   Platelets 150 - 400 K/uL 218  216  225.0       CMP     Component Value Date/Time   NA 140 08/18/2022 0846   NA 139 01/11/2022 0822   K 3.8 08/18/2022 0846   CL 105 08/18/2022 0846   CO2 28 08/18/2022 0846   GLUCOSE 116 (H) 08/18/2022 0846   BUN 19 08/18/2022 0846   BUN 18 01/11/2022 0822   CREATININE 0.86 08/18/2022 0846   CALCIUM 9.5 08/18/2022 0846   PROT 7.0 05/08/2022 1210   PROT 6.8 11/22/2020 0944   ALBUMIN 4.3 05/08/2022 1210   ALBUMIN 4.3 11/22/2020 0944   AST 26 05/08/2022 1210   ALT 20 05/08/2022 1210   ALKPHOS 42 05/08/2022 1210   BILITOT 0.7 05/08/2022 1210   BILITOT 0.3 11/22/2020 0944   GFRNONAA >60 08/18/2022 0846   GFRAA 73 11/22/2020 0944     No results found.     Assessment & Plan:   1. Varicose veins of both lower extremities with pain Currently patient has no evidence of venous reflux.  Based on this the varicosities are likely not given her significant cause of pain.  Currently no role for intervention.  2. Leg swelling Leg swelling is multi multifactoral and because I suspect much of  his trauma related from the patient's multiple falls and arthritic issues.  However we have discussed conservative therapy with the patient.  She is advised to utilize medical grade compression stockings.  We typically advise 20 to 30 mmHg to be worn daily.  She is  instructed not to sleep in these.  She should elevate her lower extremities when not active.  If she is able to, she should be active 15 to 20 minutes 3 to 4 days/week  3. Essential hypertension Continue antihypertensive medications as already ordered, these medications have been reviewed and there are no changes at this time.  4. Lumbosacral foraminal stenosis (Multilevel) (Bilateral) This is likely cause for patient's multiple falls and issues.  Patient will continue to follow with PCP   Current Outpatient Medications on File Prior to Visit  Medication Sig Dispense Refill   aspirin EC 81 MG tablet Take 81 mg by mouth daily. Swallow whole.     cetirizine (ZYRTEC) 10 MG tablet Take 10 mg by mouth daily.     Cyanocobalamin (VITAMIN B 12 PO) Take by mouth.     ELIQUIS 5 MG TABS tablet NEW PRESCRIPTION REQUEST: Eliquis 5 Mg TAKE ONE TABLET BY MOUTH TWICE DAILY 180 tablet 3   hydrochlorothiazide (HYDRODIURIL) 25 MG tablet TAKE ONE TABLET BY MOUTH DAILY 90 tablet 0   HYDROcodone-acetaminophen (NORCO/VICODIN) 5-325 MG tablet Take 1 tablet by mouth 2 (two) times daily as needed for severe pain. Must last 30 days. 60 tablet 0   HYDROcodone-acetaminophen (NORCO/VICODIN) 5-325 MG tablet Take 1 tablet by mouth 2 (two) times daily as needed for severe pain. Must last 30 days. 60 tablet 0   [START ON 09/27/2022] HYDROcodone-acetaminophen (NORCO/VICODIN) 5-325 MG tablet Take 1 tablet by mouth 2 (two) times daily as needed for severe pain. Must last 30 days. 60 tablet 0   lidocaine (LIDODERM) 5 % Place 1 patch onto the skin daily. Remove & Discard patch within 12 hours or as directed by MD 30 patch 0   magnesium oxide (MAG-OX) 400 (240 Mg) MG tablet  Take 400 mg by mouth daily.     metoprolol succinate (TOPROL-XL) 100 MG 24 hr tablet TAKE 1/2 TABLET BY MOUTH AT BEDTIME 45 tablet 0   traZODone (DESYREL) 100 MG tablet Take 1 tablet (100 mg total) by mouth at bedtime as needed for sleep. for sleep 90 tablet 1   No current facility-administered medications on file prior to visit.    There are no Patient Instructions on file for this visit. No follow-ups on file.   Georgiana Spinner, NP

## 2022-09-06 ENCOUNTER — Encounter: Payer: Self-pay | Admitting: Pain Medicine

## 2022-09-07 ENCOUNTER — Ambulatory Visit: Payer: Medicare Other | Attending: Pain Medicine | Admitting: Pain Medicine

## 2022-09-07 DIAGNOSIS — G8929 Other chronic pain: Secondary | ICD-10-CM

## 2022-09-07 DIAGNOSIS — M7061 Trochanteric bursitis, right hip: Secondary | ICD-10-CM | POA: Diagnosis not present

## 2022-09-07 DIAGNOSIS — M79604 Pain in right leg: Secondary | ICD-10-CM | POA: Diagnosis not present

## 2022-09-07 DIAGNOSIS — M79605 Pain in left leg: Secondary | ICD-10-CM

## 2022-09-07 DIAGNOSIS — M25551 Pain in right hip: Secondary | ICD-10-CM | POA: Diagnosis not present

## 2022-09-07 DIAGNOSIS — G894 Chronic pain syndrome: Secondary | ICD-10-CM

## 2022-09-07 DIAGNOSIS — M545 Low back pain, unspecified: Secondary | ICD-10-CM | POA: Diagnosis not present

## 2022-09-07 NOTE — Patient Instructions (Signed)
  ____________________________________________________________________________________________  Patient Information update  To: All of our patients.  Re: Name change.  It has been made official that our current name, "[facility]"   will soon be changed to "[facility]".   The purpose of this change is to eliminate any confusion created by the concept of our practice being a "Medication Management Pain Clinic". In the past this has led to the misconception that we treat pain primarily by the use of prescription medications.  Nothing can be farther from the truth.   Understanding PAIN MANAGEMENT: To further understand what our practice does, you first have to understand that "Pain Management" is a subspecialty that requires additional training once a physician has completed their specialty training, which can be in either Anesthesia, Neurology, Psychiatry, or Physical Medicine and Rehabilitation (PMR). Each one of these contributes to the final approach taken by each physician to the management of their patient's pain. To be a "Pain Management Specialist" you must have first completed one of the specialty trainings below.  Anesthesiologists - trained in clinical pharmacology and interventional techniques such as nerve blockade and regional as well as central neuroanatomy. They are trained to block pain before, during, and after surgical interventions.  Neurologists - trained in the diagnosis and pharmacological treatment of complex neurological conditions, such as Multiple Sclerosis, Parkinson's, spinal cord injuries, and other systemic conditions that may be associated with symptoms that may include but are not limited to pain. They tend to rely primarily on the treatment of chronic pain using prescription medications.  Psychiatrist - trained in conditions affecting the psychosocial  wellbeing of patients including but not limited to depression, anxiety, schizophrenia, personality disorders, addiction, and other substance use disorders that may be associated with chronic pain. They tend to rely primarily on the treatment of chronic pain using prescription medications.   Physical Medicine and Rehabilitation (PMR) physicians, also known as physiatrists - trained to treat a wide variety of medical conditions affecting the brain, spinal cord, nerves, bones, joints, ligaments, muscles, and tendons. Their training is primarily aimed at treating patients that have suffered injuries that have caused severe physical impairment. Their training is primarily aimed at the physical therapy and rehabilitation of those patients. They may also work alongside orthopedic surgeons or neurosurgeons using their expertise in assisting surgical patients to recover after their surgeries.  INTERVENTIONAL PAIN MANAGEMENT is sub-subspecialty of Pain Management.  Our physicians are Board-certified in Anesthesia, Pain Management, and Interventional Pain Management.  This meaning that not only have they been trained and Board-certified in their specialty of Anesthesia, and subspecialty of Pain Management, but they have also received further training in the sub-subspecialty of Interventional Pain Management, in order to become Board-certified as INTERVENTIONAL PAIN MANAGEMENT SPECIALIST.    Mission: Our goal is to use our skills in  INTERVENTIONAL PAIN MANAGEMENT as alternatives to the chronic use of prescription opioid medications for the treatment of pain. To make this more clear, we have changed our name to reflect what we do and offer. We will continue to offer medication management assessment and recommendations, but we will not be taking over any patient's medication management.  ____________________________________________________________________________________________     

## 2022-09-07 NOTE — Progress Notes (Signed)
Patient: Carmen [name]  Service Category: E/M  Provider: Gaspar Cola, MD  DOB: 17-Dec-1940  DOS: 09/07/2022  Location: Office  MRN: [id number]  Setting: Ambulatory outpatient  Referring Provider: Eugenia Pancoast, FNP  Type: Established Patient  Specialty: Interventional Pain Management  PCP: Carmen Pancoast, FNP  Location: Remote location  Delivery: TeleHealth     Virtual Encounter - Pain Management PROVIDER NOTE: Information contained herein reflects review and annotations entered in association with encounter. Interpretation of such information and data should be left to medically-trained personnel. Information provided to patient can be located elsewhere in the medical record under "Patient Instructions". Document created using STT-dictation technology, any transcriptional errors that may result from process are unintentional.    Contact & Pharmacy Preferred: [phone] Home: [phone] (home) Mobile: [phone] (mobile) E-mail: [email or web address] .[place], [place] - [address] [place] [address] Phone: [phone] Fax: [phone]  [id number] [place] [place]Phone: [phone]: [phone] [facility] [address]Phone: [phone]: [phone]  Pre-screening  Carmen [name] offered "in-person" vs "virtual" encounter. She indicated preferring virtual for this encounter.   Reason COVID-19*  Social distancing based on CDC and AMA recommendations.   I contacted Carmen [name] on 09/07/2022 via telephone.      I clearly identified myself as FGaspar Cola MD. I verified that I was speaking with the correct person using two identifiers (Name: Carmen [name], and date of birth: [phone].  Consent I sought verbal advanced consent from Carmen Junctionfor virtual visit interactions. I  informed Carmen [name] of possible security and privacy concerns, risks, and limitations associated with providing "not-in-person" medical evaluation and management services. I also informed Carmen [name] of the availability of "in-person" appointments. Finally, I informed her that there would be a charge for the virtual visit and that she could be  personally, fully or partially, financially responsible for it. [name] expressed understanding and agreed to proceed.   Historic Elements   Carmen [name] is a 81y.o. year old, female patient evaluated today after our last contact on 08/22/2022. Carmen [name]  has a past medical history of Abnormal CT scan, lumbar spine (05/11/2021) (05/17/2021), Abnormal MRI, cervical spine (05/12/2021) (05/17/2021), Allergy, Anemia, Anxiety, Arthritis, Back pain, Coronary artery disease, Depression, History of stroke (10/02/2020), Hypertension, Peripheral vascular disease ([place], Personal history of nicotine dependence (10/02/2020), Prsnl hx of TIA (TIA), and cereb infrc w/o resid deficits (10/02/2020), Stroke ([facility], and Thyroid disease. She also  has a past surgical history that includes Cardiac catheterization; Coronary angioplasty; Coronary artery bypass graft; back injections; and Cataract extraction. Carmen [name] has a current medication list which includes the following prescription(s): aspirin ec, atorvastatin, cetirizine, cholecalciferol, cyanocobalamin, divalproex, eliquis, hydrochlorothiazide, hydrocodone-acetaminophen, [START ON 09/27/2022] hydrocodone-acetaminophen, levothyroxine, lidocaine, magnesium oxide, metoprolol succinate, trazodone, and hydrocodone-acetaminophen. She  reports that she quit smoking about 2 years ago. Her smoking use included cigarettes. She has never used smokeless tobacco. She reports that she does not currently use alcohol. She reports current drug use. Drug: Hydrocodone. Carmen [name] has No Known Allergies.  Estimated body mass index is 36.52 kg/m as calculated  from the following:   Height as of 08/22/22: 5' (1.524 m).   Weight as of 08/22/22: 187 lb (84.8 kg).  HPI  Today, she is being contacted for a post-procedure assessment.  Post-procedure evaluation   Type:  Intra-articular  hip & peri-articular bursae injection #2  Laterality: Right (-RT)  Level: Lower pelvic and hip joint level.  Imaging: Fluoroscopy-guided         Anesthesia: Local anesthesia (1-2% Lidocaine) Anxiolysis: None                 Sedation: Moderate Sedation                       DOS: 08/22/2022  Performed by: Carmen Cola, MD  Purpose: Diagnostic/Therapeutic Indications: Hip pain severe enough to impact quality of life or function. Rationale (medical necessity): procedure needed and proper for the diagnosis and/or treatment of Carmen [name]'s medical symptoms and needs. 1. Chronic hip pain (Right)   2. Greater trochanteric bursitis (Right)   3. Osteoarthritis of hip (Right)   4. Chronic anticoagulation (Eliquis)    NAS-11 Pain score:   Pre-procedure: 6 /10   Post-procedure: 0-No pain/10      Effectiveness:  Initial hour after procedure: 100 %. Subsequent 4-6 hours post-procedure: 100 %. Analgesia past initial 6 hours: 100 %. Ongoing improvement:  Analgesic: Today I spoke to the patient's daughter and she indicated that her mother is doing great.  She is currently enjoying an ongoing 90 to 95% improvement on the right hip joint pain. Function: Carmen [name] reports improvement in function ROM: Carmen [name] reports improvement in ROM  Pharmacotherapy Assessment   Opioid Analgesic: Hydrocodone/APAP 5/325, 1 tab p.o. BID (10 mg/day of hydrocodone) (10 MME) (last filled on 05/02/2021) MME/day: 10 mg/day   Monitoring: [place] PMP: PDMP reviewed during this encounter.       Pharmacotherapy: No side-effects or adverse reactions reported. Compliance: No problems identified. Effectiveness: Clinically acceptable. Plan: Refer to "POC". UDS:  Summary  Date Value Ref Range  Status  04/18/2022 Note  Final    Comment:    ==================================================================== [employer] Select 13 (MW) ==================================================================== Test                             Result       Flag       Units  Drug Present and Declared for Prescription Verification   7-aminoclonazepam              111          EXPECTED   ng/mg creat    7-aminoclonazepam is an expected metabolite of clonazepam. Source of    clonazepam is a scheduled prescription medication.    Hydrocodone                    1142         EXPECTED   ng/mg creat   Hydromorphone                  151          EXPECTED   ng/mg creat   Dihydrocodeine                 126          EXPECTED   ng/mg creat   Norhydrocodone                 1954         EXPECTED   ng/mg creat    Sources of hydrocodone include scheduled prescription medications.    Hydromorphone, dihydrocodeine and norhydrocodone are expected    metabolites of hydrocodone. Hydromorphone and dihydrocodeine are  also available as scheduled prescription medications.  ==================================================================== Test                      Result    Flag   Units      Ref Range   Creatinine              57               mg/dL      >=20 ==================================================================== Declared Medications:  The flagging and interpretation on this report are based on the  following declared medications.  Unexpected results may arise from  inaccuracies in the declared medications.   **Note: The testing scope of this panel includes these medications:   Clonazepam (Klonopin)  Hydrocodone (Norco)   **Note: The testing scope of this panel does not include the  following reported medications:   Acetaminophen (Norco)  Apixaban (Eliquis)  Aspirin  Atorvastatin (Lipitor)  Calcium  Cetirizine (Zyrtec)  Hydrochlorothiazide  Levothyroxine (Synthroid)  Metoprolol  (Toprol)  Trazodone (Desyrel) ==================================================================== For clinical consultation, please call [phone]. ====================================================================    No results found for: "CBDTHCR", "D8THCCBX", "D9THCCBX"   Laboratory Chemistry Profile   Renal Lab Results  Component Value Date   BUN 19 08/18/2022   CREATININE 0.86 08/18/2022   BCR 22 01/11/2022   GFR 67.85 10/18/2021   GFRAA 73 11/22/2020   GFRNONAA >60 08/18/2022    Hepatic Lab Results  Component Value Date   AST 26 05/08/2022   ALT 20 05/08/2022   ALBUMIN 4.3 05/08/2022   ALKPHOS 42 05/08/2022   AMMONIA 19 10/06/2020    Electrolytes Lab Results  Component Value Date   NA 140 08/18/2022   K 3.8 08/18/2022   CL 105 08/18/2022   CALCIUM 9.5 08/18/2022   MG 1.6 06/22/2022    Bone Lab Results  Component Value Date   25OHVITD1 12 (L) 03/14/2021   25OHVITD2 <1.0 03/14/2021   25OHVITD3 12 03/14/2021    Inflammation (CRP: Acute Phase) (ESR: Chronic Phase) Lab Results  Component Value Date   CRP 0.8 03/14/2021   ESRSEDRATE 3 08/01/2022         Note: Above Lab results reviewed.  Imaging  DG PAIN CLINIC C-ARM 1-60 MIN NO REPORT Fluoro was used, but no Radiologist interpretation will be provided.  Please refer to "NOTES" tab for provider progress note.  Assessment  The primary encounter diagnosis was Chronic hip pain (Right). Diagnoses of Greater trochanteric bursitis (Right), Chronic lower extremity pain (3ry area of Pain) (Bilateral) (L>R), Chronic low back pain (1ry area of Pain) (Bilateral) (L>R) w/o sciatica, and Chronic pain syndrome were also pertinent to this visit.  Plan of Care  Problem-specific:  No problem-specific Assessment & Plan notes found for this encounter.  Carmen [name] has a current medication list which includes the following long-term medication(s): atorvastatin, cetirizine, divalproex, eliquis,  hydrochlorothiazide, hydrocodone-acetaminophen, [START ON 09/27/2022] hydrocodone-acetaminophen, levothyroxine, metoprolol succinate, trazodone, and hydrocodone-acetaminophen.  Pharmacotherapy (Medications Ordered): No orders of the defined types were placed in this encounter.  Orders:  No orders of the defined types were placed in this encounter.  Follow-up plan:   No follow-ups on file.     Interventional Therapies  Risk  Complexity Considerations:   Estimated body mass index is 34.2 kg/m as calculated from the following:   Height as of this encounter: _0  (1.549 m).   Weight as of this encounter: 181 lb (82.1 kg). ELIQUIS + ASA Anticoagulation (Stop: 3 days  Restart: 6 hours)   Planned  Pending:      Under consideration:   Diagnostic/therapeutic cervical ESI  Therapeutic left lumbar facet RFA #1  Diagnostic/therapeutic right L4-5 LESI #1  Diagnostic/therapeutic right L4 and L5 TFESI #1    Completed:   Diagnostic/Therapeutic right IA Hip & TBI inj. x2 (08/22/2022) (100/100/100/90-95)  Therapeutic right lumbar facet RFA x1 (06/13/2022) (100/100/100/90-100)  Diagnostic right lumbar facet MBB x3 (01/26/2022) (100/100/90/90)  Diagnostic left lumbar facet MBB x3 (01/26/2022) (100/100/90/90)  Diagnostic left cervical facet MBB x1 (06/28/2021) (25/25/0/0)    Therapeutic  Palliative (PRN) options:   Therapeutic lumbar facet MBB       Recent Visits Date Type Provider Dept  08/22/22 Procedure visit Milinda Pointer, MD [facility]  07/17/22 Office Visit Milinda Pointer, MD [facility]  06/29/22 Procedure visit Milinda Pointer, MD [facility]  06/13/22 Procedure visit Milinda Pointer, MD [facility]  Showing recent visits within past 90 days and meeting all other requirements Today's Visits Date Type Provider Dept  09/07/22 Office Visit Milinda Pointer, MD [facility]  Showing today's visits and meeting all  other requirements Future Appointments Date Type Provider Dept  10/23/22 Appointment Milinda Pointer, MD [facility]  Showing future appointments within next 90 days and meeting all other requirements  I discussed the assessment and treatment plan with the patient. The patient was provided an opportunity to ask questions and all were answered. The patient agreed with the plan and demonstrated an understanding of the instructions.  Patient advised to call back or seek an in-person evaluation if the symptoms or condition worsens.  Duration of encounter: 12 minutes.  Note by: Carmen Cola, MD Date: 09/07/2022; Time: 12:10 PM

## 2022-09-11 ENCOUNTER — Ambulatory Visit: Payer: Self-pay

## 2022-09-11 NOTE — Patient Outreach (Signed)
  Care Coordination   Follow Up Visit Note   09/11/2022 Name: Ayssa Bentivegna MRN: [id number] DOB: 02/27/1941  Jane Mcglinn is a 81 y.o. year old female who sees [place], [place], FNP for primary care. I  spoke with daughter, Clayburn Pert  What matters to the patients health and wellness today?  Ongoing follow up regarding right leg pain.     Goals Addressed             This Visit's Progress    Patient Stated:  Management of right leg pain       Care Coordination Interventions: Evaluation of current treatment plan related to right leg pain and patient's adherence to plan as established by provider.  Patient continues to be seen by pain management and vascular doctor. Daughter states she requested patient to have outpatient rehab but has not heard anything.  Per chart review physical therapy referral noted from 08/21/22 to [facility] rehab.  Daughter provided contact phone number for Outpatient rehab at [facility] and advised to call and confirm referral was received for scheduling.  Reviewed medications with patient and discussed compliance.  Daughter states patient has had follow up with vascular doctor and pain management doctor with no change in treatment plan or medications.  Reviewed scheduled/upcoming provider appointments. Per chart review patient scheduled for follow up visit with pain management provider 10/23/22 and vascular doctor 11/16/22.   Discussed fall prevention ( advised to continue to encourage patient to use Rollator or cane as advised by primary care provider to avoid falls).  Daughter reports patient had a fall since last outreach with RNCM.  She states patient slipped and feel in the home.  Daughter states patient was seen in the ED.  Reports patient developed a periorbital hematoma to right eye.   She states patient is healing well and will have a follow up visit with the eye doctor next month to assess.             SDOH assessments and interventions completed:  No      Care Coordination Interventions:  Yes, provided   Follow up plan: Follow up call scheduled for 10/20/22    Encounter Outcome:  Pt. Visit Completed   George Ina RN,BSN,CCM [facility] Care Coordination [phone] direct line

## 2022-09-20 NOTE — Progress Notes (Unsigned)
Follow-up Outpatient Visit Date: 09/21/2022  Primary Care Provider: Mort Sawyers, FNP [address] [place] [address]  Chief Complaint: Follow-up CAD and atrial fibrillation  HPI:  Carmen [name] is a 81 y.o. female with history of coronary artery disease status post CABG (2005, LIMA-LAD, sequential SVG-D-OM, and SVG-PDA) and subsequent PCI due to failure of multiple grafts (Taxus stents to proximal LAD, mid LCx, and RCA), persistent atrial fibrillation, stroke, PAD, hypertension, hyperlipidemia, hypothyroidism, anemia, chronic pain syndrome, and depression/anxiety, who presents for follow-up of coronary artery disease and atrial fibrillation.  I last saw her in June, at which time Carmen [name] reported feeling about the same as at prior visits.  Her daughter felt like lower extremity edema and exertional dyspnea were a little bit better after escalation of HCTZ.  Carmen [name] was most bothered by her chronic back pain and was contemplating nerve ablation procedure.  Due to suboptimal lipid control, we agreed to increase atorvastatin to 40 mg daily.  Otherwise, no medication changes or additional testing were pursued.  She was admitted in August with transient unresponsiveness and "stiffening up" accompanied by slurred speech.  Symptoms have essentially resolved by the time she mated to the emergency department.  Brain MRI was without acute abnormalities.  It was unclear if the episode was due to a TIA or strokelike episode.  She was discharged on Depakote at the recommendation of neurology.  She presented to the ED again last month after falling while using her bedside commode.  She suffered a periorbital hematoma but no acute intracranial injury.  Today, Carmen [name] is feeling fairly well.  She still has some facial soreness and bruising.  She actually fell out of bed two nights in a row.  She does not recall what happened either time.  She has also fallen several other times over the past few  months, with her family reporting that this often occurs when she is doing things "that she should not be doing."  She remains on apixaban and aspirin.  She denies chest pain, shortness of breath, and palpitations.  She has occasional dizziness.  She continues to have difficulty walking due to back and right leg pain, though she is able to ambulate around her assisted living facility with a rolling walker.  Heart monitor was previously ordered by neurology, though Carmen [name] declined to have it placed.  --------------------------------------------------------------------------------------------------  Cardiovascular History & Procedures: Cardiovascular Problems: Coronary artery disease status post CABG and PCI Paroxysmal atrial fibrillation Peripheral vascular disease Left internal carotid artery occlusion   Risk Factors: Known CAD and PAD, stroke, obesity, tobacco use, and age greater than 47   Cath/PCI: LHC/PCI (2006): Taxus DES to proximal LAD, mid LCx, and RCA x2.   CV Surgery: CABG (2005, LIMA to LAD, SVG to diagonal and OM, and SVG to PDA)   EP Procedures and Devices: None   Non-Invasive Evaluation(s): TTE (05/09/2022): Normal LV size with mild LVH.  LVEF 70-75%.  Unable to assess regional wall motion and diastolic function.  Normal RV size and function.  Mild left atrial enlargement.  No significant valvular abnormality. 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  1-39% stenosis of the right internal carotid artery.  Antegrade vertebral artery flow bilaterally. TTE (12/14/2020): Normal LV size with moderate LVH.  LVEF 55-60%.  Indeterminate diastolic function.  Normal RV size and function.  Normal PA pressure.  Mild left atrial enlargement.  Mild mitral and tricuspid regurgitation.  Normal  CVP. 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  Recent CV Pertinent Labs: Lab Results  Component Value Date   CHOL 156  05/09/2022   HDL 48 05/09/2022   LDLCALC 83 05/09/2022   LDLDIRECT 99.0 07/26/2021   TRIG 125 05/09/2022   CHOLHDL 3.3 05/09/2022   INR 1.1 05/08/2022   K 3.8 08/18/2022   MG 1.6 06/22/2022   BUN 19 08/18/2022   BUN 18 01/11/2022   CREATININE 0.86 08/18/2022    Past medical and surgical history were reviewed and updated in EPIC.  Current Meds  Medication Sig   aspirin EC 81 MG tablet Take 81 mg by mouth daily. Swallow whole.   atorvastatin (LIPITOR) 40 MG tablet Take 1 tablet (40 mg total) by mouth at bedtime.   cetirizine (ZYRTEC) 10 MG tablet Take 10 mg by mouth daily.   cholecalciferol (VITAMIN D3) 25 MCG (1000 UNIT) tablet Take 1,000 Units by mouth daily.   Cyanocobalamin (VITAMIN B 12 PO) Take by mouth.   divalproex (DEPAKOTE) 500 MG DR tablet Take 1 tablet (500 mg total) by mouth at bedtime.   ELIQUIS 5 MG TABS tablet NEW PRESCRIPTION REQUEST: Eliquis 5 Mg TAKE ONE TABLET BY MOUTH TWICE DAILY   hydrochlorothiazide (HYDRODIURIL) 25 MG tablet TAKE ONE TABLET BY MOUTH DAILY   HYDROcodone-acetaminophen (NORCO/VICODIN) 5-325 MG tablet Take 1 tablet by mouth 2 (two) times daily as needed for severe pain. Must last 30 days.   levothyroxine (SYNTHROID) 137 MCG tablet Take 1 tablet (137 mcg total) by mouth daily before breakfast.   magnesium oxide (MAG-OX) 400 (240 Mg) MG tablet Take 400 mg by mouth daily.   metoprolol succinate (TOPROL-XL) 100 MG 24 hr tablet TAKE 1/2 TABLET BY MOUTH AT BEDTIME   traZODone (DESYREL) 100 MG tablet Take 1 tablet (100 mg total) by mouth at bedtime as needed for sleep. for sleep    Allergies: Patient has no known allergies.  Social History   Tobacco Use   Smoking status: Former    Years: 30.00    Types: Cigarettes    Quit date: 05/2020    Years since quitting: 2.3   Smokeless tobacco: Never   Tobacco comments:    Smoked about 1 pack per month  Vaping Use   Vaping Use: Never used  Substance Use Topics   Alcohol use: Not Currently   Drug  use: Yes    Types: Hydrocodone    Family History  Problem Relation Age of Onset   Breast cancer Mother    Heart attack Father    Bipolar disorder Daughter    Diabetes Mellitus II Neg Hx     Review of Systems: A 12-system review of systems was performed and was negative except as noted in the HPI.  --------------------------------------------------------------------------------------------------  Physical Exam: BP (!) 140/84 (BP Location: Right Arm, Patient Position: Sitting, Cuff Size: Large)   Pulse 96   Ht 5\' 1"  (1.549 m)   Wt 188 lb 12.8 oz (85.6 kg)   SpO2 96%   BMI 35.67 kg/m   General:  NAD. Neck: No JVD or HJR. Lungs: Clear to auscultation bilaterally without wheezes or crackles. Heart: Irregularly irregular rhythm without murmurs. Abdomen: Soft, nontender, nondistended. Extremities: No lower extremity edema.  EKG:  Coarse atrial fibrillation with lateral ST/T changes that are slightly more pronounced than on prior tracings as recently as 08/18/2022.  Lab Results  Component Value Date   WBC 8.5 08/18/2022   HGB 13.0 08/18/2022   HCT 39.5 08/18/2022  MCV 89.6 08/18/2022   PLT 218 08/18/2022    Lab Results  Component Value Date   NA 140 08/18/2022   K 3.8 08/18/2022   CL 105 08/18/2022   CO2 28 08/18/2022   BUN 19 08/18/2022   CREATININE 0.86 08/18/2022   GLUCOSE 116 (H) 08/18/2022   ALT 20 05/08/2022    Lab Results  Component Value Date   CHOL 156 05/09/2022   HDL 48 05/09/2022   LDLCALC 83 05/09/2022   LDLDIRECT 99.0 07/26/2021   TRIG 125 05/09/2022   CHOLHDL 3.3 05/09/2022    --------------------------------------------------------------------------------------------------  ASSESSMENT AND PLAN: Permanent atrial fibrillation and questionable syncope: Ventricular rate control remains reasonable.  However, I am concerned about recurrent falls and questionable syncope.  Event monitor was ordered this fall but never placed because the patient  did not wish to wear it.  We discussed the risks and benefits of continued anticoagulation in the setting of her recurrent falls and a CHADSVASC score of at least 6.  We have agreed to consult with Dr. Lalla Brothers regarding role for Watchman implantation as an alternative to long-term anticoagulation, as well as potential for ILR placement given patient's unwillingness to wear an external event monitor.  For the time being, we will continue apixaban 5 mg BID and metoprolol succinate 50 mg QHS.  Coronary artery disease: No angina reported, though lateral T-wave inversions are more pronounced on today's tracing.  Continue ASA + apixaban for now pending EP consultation, given history of Taxus stent implantation.  Continue atorvastatin for secondary prevention as well.  Hypertension: BP mildly elevated.  Will allow mild permissive hypertension with dizziness/fall pending further EP consultation.  Follow-up: Return to clinic in 3-4 months.  Yvonne Kendall, MD 09/21/2022 8:55 AM

## 2022-09-21 ENCOUNTER — Encounter: Payer: Self-pay | Admitting: Internal Medicine

## 2022-09-21 ENCOUNTER — Ambulatory Visit: Payer: Medicare Other | Attending: Internal Medicine | Admitting: Internal Medicine

## 2022-09-21 VITALS — BP 140/84 | HR 96 | Ht 61.0 in | Wt 188.8 lb

## 2022-09-21 DIAGNOSIS — I25810 Atherosclerosis of coronary artery bypass graft(s) without angina pectoris: Secondary | ICD-10-CM | POA: Diagnosis not present

## 2022-09-21 DIAGNOSIS — I10 Essential (primary) hypertension: Secondary | ICD-10-CM

## 2022-09-21 DIAGNOSIS — R55 Syncope and collapse: Secondary | ICD-10-CM | POA: Diagnosis not present

## 2022-09-21 DIAGNOSIS — I4821 Permanent atrial fibrillation: Secondary | ICD-10-CM | POA: Diagnosis not present

## 2022-09-21 NOTE — Patient Instructions (Signed)
Medication Instructions:  Your Physician recommend you continue on your current medication as directed.    *If you need a refill on your cardiac medications before your next appointment, please call your pharmacy*   Lab Work: None ordered today   Testing/Procedures: None ordered today   Follow-Up: At [facility], you and your health needs are our priority.  As part of our continuing mission to provide you with exceptional heart care, we have created designated Provider Care Teams.  These Care Teams include your primary Cardiologist (physician) and Advanced Practice Providers (APPs -  Physician Assistants and Nurse Practitioners) who all work together to provide you with the care you need, when you need it.  We recommend signing up for the patient portal called "MyChart".  Sign up information is provided on this After Visit Summary.  MyChart is used to connect with patients for Virtual Visits (Telemedicine).  Patients are able to view lab/test results, encounter notes, upcoming appointments, etc.  Non-urgent messages can be sent to your provider as well.   To learn more about what you can do with MyChart, go to [email or web address].    Your next appointment:   3-4 month(s)  The format for your next appointment:   In Person  Provider:   You may see Yvonne Kendall, MD or one of the following Advanced Practice Providers on your designated Care Team:   Nicolasa Ducking, NP Eula Listen, PA-C Cadence Fransico Michael, PA-C Charlsie Quest, NP

## 2022-09-28 ENCOUNTER — Other Ambulatory Visit (INDEPENDENT_AMBULATORY_CARE_PROVIDER_SITE_OTHER): Payer: Medicare Other

## 2022-09-28 DIAGNOSIS — E039 Hypothyroidism, unspecified: Secondary | ICD-10-CM | POA: Diagnosis not present

## 2022-09-29 LAB — TSH: TSH: 3.93 u[IU]/mL (ref 0.35–5.50)

## 2022-10-05 ENCOUNTER — Other Ambulatory Visit: Payer: Self-pay | Admitting: Family

## 2022-10-05 DIAGNOSIS — G4700 Insomnia, unspecified: Secondary | ICD-10-CM

## 2022-10-05 DIAGNOSIS — K080 Exfoliation of teeth due to systemic causes: Secondary | ICD-10-CM | POA: Diagnosis not present

## 2022-10-12 ENCOUNTER — Ambulatory Visit: Payer: Medicare Other | Admitting: Neurology

## 2022-10-14 ENCOUNTER — Emergency Department (HOSPITAL_COMMUNITY): Payer: Medicare Other

## 2022-10-14 ENCOUNTER — Other Ambulatory Visit: Payer: Self-pay

## 2022-10-14 ENCOUNTER — Observation Stay (HOSPITAL_COMMUNITY): Payer: Medicare Other

## 2022-10-14 ENCOUNTER — Other Ambulatory Visit: Payer: Self-pay | Admitting: Family

## 2022-10-14 ENCOUNTER — Observation Stay (HOSPITAL_COMMUNITY)
Admission: EM | Admit: 2022-10-14 | Discharge: 2022-10-15 | Disposition: A | Discharge status: Home / Self Care | Payer: Medicare Other | Attending: Internal Medicine | Admitting: Internal Medicine

## 2022-10-14 DIAGNOSIS — I480 Paroxysmal atrial fibrillation: Secondary | ICD-10-CM | POA: Diagnosis present

## 2022-10-14 DIAGNOSIS — R410 Disorientation, unspecified: Secondary | ICD-10-CM | POA: Diagnosis not present

## 2022-10-14 DIAGNOSIS — I4891 Unspecified atrial fibrillation: Secondary | ICD-10-CM | POA: Diagnosis not present

## 2022-10-14 DIAGNOSIS — I6782 Cerebral ischemia: Secondary | ICD-10-CM | POA: Diagnosis not present

## 2022-10-14 DIAGNOSIS — I6603 Occlusion and stenosis of bilateral middle cerebral arteries: Secondary | ICD-10-CM | POA: Diagnosis not present

## 2022-10-14 DIAGNOSIS — Z7901 Long term (current) use of anticoagulants: Secondary | ICD-10-CM | POA: Insufficient documentation

## 2022-10-14 DIAGNOSIS — E785 Hyperlipidemia, unspecified: Secondary | ICD-10-CM | POA: Diagnosis present

## 2022-10-14 DIAGNOSIS — R531 Weakness: Secondary | ICD-10-CM | POA: Insufficient documentation

## 2022-10-14 DIAGNOSIS — E039 Hypothyroidism, unspecified: Secondary | ICD-10-CM | POA: Diagnosis present

## 2022-10-14 DIAGNOSIS — I6523 Occlusion and stenosis of bilateral carotid arteries: Secondary | ICD-10-CM | POA: Diagnosis not present

## 2022-10-14 DIAGNOSIS — R4182 Altered mental status, unspecified: Secondary | ICD-10-CM | POA: Insufficient documentation

## 2022-10-14 DIAGNOSIS — Z8673 Personal history of transient ischemic attack (TIA), and cerebral infarction without residual deficits: Secondary | ICD-10-CM

## 2022-10-14 DIAGNOSIS — Z66 Do not resuscitate: Secondary | ICD-10-CM | POA: Diagnosis present

## 2022-10-14 DIAGNOSIS — G4700 Insomnia, unspecified: Secondary | ICD-10-CM

## 2022-10-14 DIAGNOSIS — Y9301 Activity, walking, marching and hiking: Secondary | ICD-10-CM | POA: Diagnosis not present

## 2022-10-14 DIAGNOSIS — R55 Syncope and collapse: Secondary | ICD-10-CM | POA: Diagnosis not present

## 2022-10-14 DIAGNOSIS — M25521 Pain in right elbow: Secondary | ICD-10-CM | POA: Diagnosis not present

## 2022-10-14 DIAGNOSIS — R29818 Other symptoms and signs involving the nervous system: Secondary | ICD-10-CM

## 2022-10-14 DIAGNOSIS — I639 Cerebral infarction, unspecified: Secondary | ICD-10-CM | POA: Diagnosis not present

## 2022-10-14 DIAGNOSIS — Z7982 Long term (current) use of aspirin: Secondary | ICD-10-CM | POA: Insufficient documentation

## 2022-10-14 DIAGNOSIS — R2990 Unspecified symptoms and signs involving the nervous system: Secondary | ICD-10-CM | POA: Diagnosis present

## 2022-10-14 DIAGNOSIS — I10 Essential (primary) hypertension: Secondary | ICD-10-CM | POA: Diagnosis present

## 2022-10-14 DIAGNOSIS — S0011XA Contusion of right eyelid and periocular area, initial encounter: Secondary | ICD-10-CM | POA: Diagnosis not present

## 2022-10-14 DIAGNOSIS — R0902 Hypoxemia: Secondary | ICD-10-CM | POA: Diagnosis not present

## 2022-10-14 DIAGNOSIS — I6612 Occlusion and stenosis of left anterior cerebral artery: Secondary | ICD-10-CM | POA: Diagnosis not present

## 2022-10-14 DIAGNOSIS — W19XXXA Unspecified fall, initial encounter: Secondary | ICD-10-CM | POA: Diagnosis not present

## 2022-10-14 DIAGNOSIS — R0681 Apnea, not elsewhere classified: Secondary | ICD-10-CM | POA: Diagnosis not present

## 2022-10-14 DIAGNOSIS — R079 Chest pain, unspecified: Secondary | ICD-10-CM | POA: Diagnosis not present

## 2022-10-14 DIAGNOSIS — S0990XA Unspecified injury of head, initial encounter: Secondary | ICD-10-CM | POA: Diagnosis not present

## 2022-10-14 LAB — COMPREHENSIVE METABOLIC PANEL
ALT: 26 U/L (ref 0–44)
AST: 30 U/L (ref 15–41)
Albumin: 3.8 g/dL (ref 3.5–5.0)
Alkaline Phosphatase: 39 U/L (ref 38–126)
Anion gap: 10 (ref 5–15)
BUN: 15 mg/dL (ref 8–23)
CO2: 24 mmol/L (ref 22–32)
Calcium: 8.9 mg/dL (ref 8.9–10.3)
Chloride: 102 mmol/L (ref 98–111)
Creatinine, Ser: 0.9 mg/dL (ref 0.44–1.00)
GFR, Estimated: >60 mL/min (ref >60)
Glucose, Bld: 113 mg/dL — ABNORMAL HIGH (ref 70–99)
Potassium: 4.5 mmol/L (ref 3.5–5.1)
Sodium: 136 mmol/L (ref 135–145)
Total Bilirubin: 0.7 mg/dL (ref 0.3–1.2)
Total Protein: 6.3 g/dL — ABNORMAL LOW (ref 6.5–8.1)

## 2022-10-14 LAB — TROPONIN I (HIGH SENSITIVITY): Troponin I (High Sensitivity): 14 ng/L (ref <18)

## 2022-10-14 LAB — DIFFERENTIAL
Abs Immature Granulocytes: 0.04 10*3/uL (ref 0.00–0.07)
Basophils Absolute: 0 10*3/uL (ref 0.0–0.1)
Basophils Relative: 1 %
Eosinophils Absolute: 0.1 10*3/uL (ref 0.0–0.5)
Eosinophils Relative: 2 %
Immature Granulocytes: 1 %
Lymphocytes Relative: 24 %
Lymphs Abs: 1.8 10*3/uL (ref 0.7–4.0)
Monocytes Absolute: 0.9 10*3/uL (ref 0.1–1.0)
Monocytes Relative: 11 %
Neutro Abs: 4.7 10*3/uL (ref 1.7–7.7)
Neutrophils Relative %: 61 %

## 2022-10-14 LAB — PROTIME-INR
INR: 1.3 — ABNORMAL HIGH (ref 0.8–1.2)
Prothrombin Time: 15.6 seconds — ABNORMAL HIGH (ref 11.4–15.2)

## 2022-10-14 LAB — CBC
HCT: 36.6 % (ref 36.0–46.0)
Hemoglobin: 11.4 g/dL — ABNORMAL LOW (ref 12.0–15.0)
MCH: 29.7 pg (ref 26.0–34.0)
MCHC: 31.1 g/dL (ref 30.0–36.0)
MCV: 95.3 fL (ref 80.0–100.0)
Platelets: 225 10*3/uL (ref 150–400)
RBC: 3.84 MIL/uL — ABNORMAL LOW (ref 3.87–5.11)
RDW: 14.1 % (ref 11.5–15.5)
WBC: 7.6 10*3/uL (ref 4.0–10.5)
nRBC: 0 % (ref 0.0–0.2)

## 2022-10-14 LAB — URINALYSIS, ROUTINE W REFLEX MICROSCOPIC
Bacteria, UA: NONE SEEN
Bilirubin Urine: NEGATIVE
Glucose, UA: NEGATIVE mg/dL
Hgb urine dipstick: NEGATIVE
Ketones, ur: NEGATIVE mg/dL
Leukocytes,Ua: NEGATIVE
Nitrite: NEGATIVE
Protein, ur: 30 mg/dL — AB
Specific Gravity, Urine: 1.02 (ref 1.005–1.030)
pH: 7 (ref 5.0–8.0)

## 2022-10-14 LAB — I-STAT CHEM 8, ED
BUN: 18 mg/dL (ref 8–23)
Calcium, Ion: 1.11 mmol/L — ABNORMAL LOW (ref 1.15–1.40)
Chloride: 101 mmol/L (ref 98–111)
Creatinine, Ser: 0.9 mg/dL (ref 0.44–1.00)
Glucose, Bld: 110 mg/dL — ABNORMAL HIGH (ref 70–99)
HCT: 35 % — ABNORMAL LOW (ref 36.0–46.0)
Hemoglobin: 11.9 g/dL — ABNORMAL LOW (ref 12.0–15.0)
Potassium: 4.5 mmol/L (ref 3.5–5.1)
Sodium: 137 mmol/L (ref 135–145)
TCO2: 26 mmol/L (ref 22–32)

## 2022-10-14 LAB — CBG MONITORING, ED: Glucose-Capillary: 102 mg/dL — ABNORMAL HIGH (ref 70–99)

## 2022-10-14 LAB — APTT: aPTT: 27 seconds (ref 24–36)

## 2022-10-14 LAB — VALPROIC ACID LEVEL: Valproic Acid Lvl: 25 ug/mL — ABNORMAL LOW (ref 50.0–100.0)

## 2022-10-14 LAB — ETHANOL: Alcohol, Ethyl (B): <10 mg/dL (ref <10)

## 2022-10-14 MED ORDER — MIDAZOLAM HCL 2 MG/2ML IJ SOLN
1.0000 mg | Freq: Once | INTRAMUSCULAR | Status: AC | PRN
  Administered 2022-10-14: 1 mg via INTRAVENOUS
  Filled 2022-10-14: qty 2

## 2022-10-14 MED ORDER — ASPIRIN 81 MG PO TBEC
81.0000 mg | DELAYED_RELEASE_TABLET | Freq: Every day | ORAL | Status: DC
  Administered 2022-10-14: 81 mg via ORAL
  Filled 2022-10-14: qty 1

## 2022-10-14 MED ORDER — SODIUM CHLORIDE 0.9% FLUSH
3.0000 mL | Freq: Once | INTRAVENOUS | Status: AC
  Administered 2022-10-14: 3 mL via INTRAVENOUS

## 2022-10-14 MED ORDER — ONDANSETRON HCL 4 MG/2ML IJ SOLN: 4.0000 mg | Freq: Four times a day (QID) | INTRAMUSCULAR | Status: DC | PRN

## 2022-10-14 MED ORDER — TRAZODONE HCL 50 MG PO TABS: 100.0000 mg | ORAL_TABLET | Freq: Every evening | ORAL | Status: DC | PRN

## 2022-10-14 MED ORDER — DIVALPROEX SODIUM 250 MG PO DR TAB
500.0000 mg | DELAYED_RELEASE_TABLET | Freq: Every day | ORAL | Status: DC
  Administered 2022-10-14: 500 mg via ORAL
  Filled 2022-10-14: qty 2

## 2022-10-14 MED ORDER — ACETAMINOPHEN 650 MG RE SUPP: 650.0000 mg | RECTAL | Status: DC | PRN

## 2022-10-14 MED ORDER — ORAL CARE MOUTH RINSE: 15.0000 mL | OROMUCOSAL | Status: DC | PRN

## 2022-10-14 MED ORDER — IOHEXOL 350 MG/ML SOLN
75.0000 mL | Freq: Once | INTRAVENOUS | Status: AC | PRN
  Administered 2022-10-14: 75 mL via INTRAVENOUS

## 2022-10-14 MED ORDER — SENNOSIDES-DOCUSATE SODIUM 8.6-50 MG PO TABS: 1.0000 | ORAL_TABLET | Freq: Every evening | ORAL | Status: DC | PRN

## 2022-10-14 MED ORDER — SODIUM CHLORIDE 0.9 % IV SOLN: 75.0000 mL/h | INTRAVENOUS | Status: DC

## 2022-10-14 MED ORDER — HYDROCODONE-ACETAMINOPHEN 5-325 MG PO TABS
1.0000 | ORAL_TABLET | Freq: Two times a day (BID) | ORAL | Status: DC | PRN
  Administered 2022-10-14: 1 via ORAL
  Filled 2022-10-14: qty 1

## 2022-10-14 MED ORDER — SODIUM CHLORIDE 0.9 % IV SOLN: INTRAVENOUS | Status: DC

## 2022-10-14 MED ORDER — LEVOTHYROXINE SODIUM 25 MCG PO TABS: 137.0000 ug | ORAL_TABLET | Freq: Every day | ORAL | Status: DC

## 2022-10-14 MED ORDER — STROKE: EARLY STAGES OF RECOVERY BOOK: Freq: Once | Status: DC

## 2022-10-14 MED ORDER — ATORVASTATIN CALCIUM 40 MG PO TABS
40.0000 mg | ORAL_TABLET | Freq: Every day | ORAL | Status: DC
  Administered 2022-10-14: 40 mg via ORAL
  Filled 2022-10-14: qty 1

## 2022-10-14 MED ORDER — ACETAMINOPHEN 325 MG PO TABS: 650.0000 mg | ORAL_TABLET | ORAL | Status: DC | PRN

## 2022-10-14 MED ORDER — APIXABAN 5 MG PO TABS
5.0000 mg | ORAL_TABLET | Freq: Two times a day (BID) | ORAL | Status: DC
  Administered 2022-10-14: 5 mg via ORAL
  Filled 2022-10-14: qty 1

## 2022-10-14 MED ORDER — LEVOTHYROXINE SODIUM 25 MCG PO TABS: 125.0000 ug | ORAL_TABLET | Freq: Every day | ORAL | Status: DC

## 2022-10-14 MED ORDER — ACETAMINOPHEN 160 MG/5ML PO SOLN: 650.0000 mg | ORAL | Status: DC | PRN

## 2022-10-14 MED ORDER — ORAL CARE MOUTH RINSE: 15.0000 mL | OROMUCOSAL | Status: DC

## 2022-10-14 NOTE — ED Provider Notes (Addendum)
[facility] EMERGENCY DEPARTMENT Provider Note   CSN: [id number] Arrival date & time: 10/14/22  1010  An emergency department physician performed an initial assessment on this suspected stroke patient at 1010.  History  Chief Complaint  Patient presents with   Code Stroke    Bridgett Cheetham is a 82 y.o. female.  82 year old female on Eliquis who presents to the emergency department with altered mental status and weakness.  History obtained per EMS who reports that the patient was last known well at 830 when she was talking on the phone with her daughter.  They reported that she was walking around her facility and was in her normal state of health.  Daughter went to visit her at 52 and found her on the ground unresponsive.  When EMS arrived she was minimally responsive blue, and had agonal respirations.  They started bagging the patient and brought her into the emergency department for additional evaluation.  Also noticed right eye deviation and bruising underneath her eyelids.       Home Medications Prior to Admission medications   Medication Sig Start Date End Date Taking? Authorizing Provider  aspirin EC 81 MG tablet Take 81 mg by mouth daily. Swallow whole.   Yes [provider]  atorvastatin (LIPITOR) 40 MG tablet Take 1 tablet (40 mg total) by mouth at bedtime. 08/21/22  Yes Dugal, Wyatt Mage, FNP  CALCIUM PO Take 1 tablet by mouth daily.   Yes [provider]  divalproex (DEPAKOTE) 500 MG DR tablet Take 1 tablet (500 mg total) by mouth at bedtime. 08/28/22 11/26/22 Yes Dugal, Tabitha, FNP  ELIQUIS 5 MG TABS tablet NEW PRESCRIPTION REQUEST: Eliquis 5 Mg TAKE ONE TABLET BY MOUTH TWICE DAILY Patient taking differently: Take 5 mg by mouth 2 (two) times daily. 06/21/22  Yes End, Cristal Deer, MD  HYDROcodone-acetaminophen (NORCO/VICODIN) 5-325 MG tablet Take 1 tablet by mouth 2 (two) times daily as needed for severe pain. Must last 30 days. 08/28/22  10/14/22 Yes Delano Metz, MD  levothyroxine (SYNTHROID) 137 MCG tablet Take 137 mcg by mouth daily before breakfast.   Yes [provider]  magnesium oxide (MAG-OX) 400 (240 Mg) MG tablet Take 400 mg by mouth daily.   Yes [provider]  metoprolol succinate (TOPROL-XL) 100 MG 24 hr tablet TAKE 1/2 TABLET BY MOUTH AT BEDTIME 07/31/22  Yes End, Cristal Deer, MD  Multiple Vitamin (MULTIVITAMIN) tablet Take 1 tablet by mouth daily.   Yes [provider]  traZODone (DESYREL) 100 MG tablet TAKE 1 TABLET BY MOUTH AT BEDTIME AS NEEDED FOR SLEEP Patient not taking: Reported on 10/14/2022 10/05/22   Mort Sawyers, FNP      Allergies    Patient has no known allergies.    Review of Systems   Review of Systems  Physical Exam Updated Vital Signs BP (!) 149/96   Pulse 83   Temp 98.2 F (36.8 C) (Temporal)   Resp 14   SpO2 96%  Physical Exam Vitals and nursing note reviewed.  Constitutional:      General: She is in acute distress.     Appearance: Normal appearance. She is well-developed. She is ill-appearing.     Comments: Initially being bagged by EMS but responding to questions.  Placed on nonrebreather.  HENT:     Head: Normocephalic and atraumatic.     Comments: L infraorbital bruising    Right Ear: External ear normal.     Left Ear: External ear normal.     Nose:  Nose normal.     Mouth/Throat:     Mouth: Mucous membranes are moist.     Pharynx: Oropharynx is clear.  Eyes:     Extraocular Movements: Extraocular movements intact.     Conjunctiva/sclera: Conjunctivae normal.     Pupils: Pupils are equal, round, and reactive to light.     Comments: Forced right eye deviation.  Neck:     Comments: No C-spine midline tenderness to palpation Cardiovascular:     Rate and Rhythm: Normal rate. Rhythm irregular.     Pulses: Normal pulses.  Pulmonary:     Effort: Pulmonary effort is normal. No respiratory distress.  Abdominal:     General: Abdomen is flat.  Bowel sounds are normal. There is no distension.     Palpations: Abdomen is soft. There is no mass.     Tenderness: There is no abdominal tenderness. There is no guarding.  Musculoskeletal:        General: No deformity. Normal range of motion.     Cervical back: No rigidity or tenderness.     Right lower leg: No edema.     Left lower leg: No edema.     Comments: No tenderness to palpation of midline thoracic or lumbar spine.  No step-offs palpated.  No tenderness to palpation of chest wall.  No bruising noted.  No tenderness to palpation of bilateral clavicles.  No tenderness to palpation, bruising, or deformities noted of bilateral shoulders, wrists, hips, knees, or ankles. R elbow TTP.   Skin:    General: Skin is warm and dry.  Neurological:     Mental Status: She is alert.     Comments: Oriented to self. Thought it was 2023. Moves RUE and RLE on command. No movement of L hemibody to command. Later started moving L side of body spontaneously.   Psychiatric:        Mood and Affect: Mood normal.     ED Results / Procedures / Treatments   Labs (all labs ordered are listed, but only abnormal results are displayed) Labs Reviewed  PROTIME-INR - Abnormal; Notable for the following components:      Result Value   Prothrombin Time 15.6 (*)    INR 1.3 (*)    All other components within normal limits  CBC - Abnormal; Notable for the following components:   RBC 3.84 (*)    Hemoglobin 11.4 (*)    All other components within normal limits  COMPREHENSIVE METABOLIC PANEL - Abnormal; Notable for the following components:   Glucose, Bld 113 (*)    Total Protein 6.3 (*)    All other components within normal limits  VALPROIC ACID LEVEL - Abnormal; Notable for the following components:   Valproic Acid Lvl 25 (*)    All other components within normal limits  URINALYSIS, ROUTINE W REFLEX MICROSCOPIC - Abnormal; Notable for the following components:   Color, Urine STRAW (*)    Protein, ur 30  (*)    All other components within normal limits  I-STAT CHEM 8, ED - Abnormal; Notable for the following components:   Glucose, Bld 110 (*)    Calcium, Ion 1.11 (*)    Hemoglobin 11.9 (*)    HCT 35.0 (*)    All other components within normal limits  CBG MONITORING, ED - Abnormal; Notable for the following components:   Glucose-Capillary 102 (*)    All other components within normal limits  APTT  DIFFERENTIAL  ETHANOL  BRAIN NATRIURETIC PEPTIDE  TSH  LIPID PANEL  TROPONIN I (HIGH SENSITIVITY)  TROPONIN I (HIGH SENSITIVITY)    EKG EKG Interpretation  Date/Time:  Saturday October 14 2022 10:18:25 EST Ventricular Rate:  98 PR Interval:    QRS Duration: 85 QT Interval:  346 QTC Calculation: 442 R Axis:   96 Text Interpretation: Atrial flutter Probable right ventricular hypertrophy Nonspecific T abnrm, anterolateral leads Confirmed by Vonita Moss [phone]) on 10/14/2022 10:43:32 AM  Radiology MR BRAIN WO CONTRAST  Result Date: 10/14/2022 CLINICAL DATA:  Right gaze preference with concern for stroke EXAM: MRI HEAD WITHOUT CONTRAST TECHNIQUE: Multiplanar, multiecho pulse sequences of the brain and surrounding structures were obtained without intravenous contrast. COMPARISON:  CT and CTA from earlier today FINDINGS: Brain: No acute infarction, hemorrhage, hydrocephalus, extra-axial collection or mass lesion. 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 Chronic small vessel ischemia in the cerebral white matter. Confluent chronic small vessel ischemia in the deep cerebral white matter. Mild for age cerebral volume loss. 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 Preceding CTA. Skull and upper cervical spine: No focal marrow lesion. Sinuses/Orbits: No emergent finding.  Bilateral cataract resection. Other: Hematoma appearance in the right cheek. IMPRESSION: 1. No acute finding. 2. 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 Electronically Signed   By: Tiburcio Pea M.D.   On: 10/14/2022 16:56   EEG adult  Result Date: 10/14/2022 Charlsie Quest, MD     10/14/2022 12:15 PM Patient Name: Hiba Garry MRN: [id number] Epilepsy Attending: Charlsie Quest Referring Physician/Provider: Rejeana Brock, MD Date: 10/14/2022 Duration: 21.34 mins Patient history: 82 year old female with acute left-sided weakness and numbness. EEG to evaluate for seizure Level of alertness: Awake, asleep AEDs during EEG study: None Technical aspects: This EEG study was done with scalp electrodes positioned according to the 10-20 International system of electrode placement. Electrical activity was reviewed with band pass filter of 1-70Hz , sensitivity of 7 uV/mm, display speed of 35mm/sec with a 60Hz  notched filter applied as appropriate. EEG data were recorded continuously and digitally stored.  Video monitoring was available and reviewed as appropriate. Description: The posterior dominant rhythm consists of 7 Hz activity of moderate voltage (25-35 uV) seen predominantly in posterior head regions, asymmetric ( right<left) and reactive to eye opening and eye closing. Sleep was characterized by vertex waves, sleep spindles (12 to 14 Hz), maximal frontocentral region. EEG showed continuous low amplitude 3 to 6 Hz theta-delta slowing in right hemisphere.Hyperventilation and photic stimulation were not performed.   ABNORMALITY - Continuous slow, right hemisphere IMPRESSION: This study is suggestive of cortical dysfunction arising from right hemisphere, likely secondary to underlying structural abnormality, post-ictal state.No seizures or epileptiform discharges were seen throughout the recording.   DG Elbow Complete Right  Result Date: 10/14/2022 CLINICAL DATA:  RIGHT elbow pain status post fall. EXAM: RIGHT ELBOW - COMPLETE 3+ VIEW COMPARISON:  None Available. FINDINGS: Osseous alignment is  normal. No fracture line or displaced fracture fragment is seen. No evidence of underlying joint effusion. Overlying soft tissues are unremarkable. IMPRESSION: Negative. Electronically Signed   By: 10/16/2022 M.D.   On: 10/14/2022 11:28   DG Chest 1 View  Result Date: 10/14/2022 CLINICAL DATA:  RIGHT elbow pain status post fall. EXAM: CHEST  1 VIEW COMPARISON:  Chest x-ray dated 10/06/2020. FINDINGS: Stable cardiomegaly. Median sternotomy wires appear intact and stable in alignment. Mild bilateral interstitial prominence. No confluence opacity  to suggest a consolidating pneumonia or alveolar pulmonary edema. No pleural effusion or pneumothorax is seen. No acute-appearing osseous abnormality. IMPRESSION: 1. Cardiomegaly. 2. 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 Electronically Signed   By: Franki Cabot M.D.   On: 10/14/2022 11:27   CT ANGIO HEAD NECK W WO CM (CODE STROKE)  Result Date: 10/14/2022 CLINICAL DATA:  82 year old female with right MCA type deficit. 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 EXAM: CT ANGIOGRAPHY HEAD AND NECK TECHNIQUE: Multidetector CT imaging of the head and neck was performed using the standard protocol during bolus administration of intravenous contrast. Multiplanar CT image reconstructions and MIPs were obtained to evaluate the vascular anatomy. Carotid stenosis measurements (when applicable) are obtained utilizing NASCET criteria, using the distal internal carotid diameter as the denominator. RADIATION DOSE REDUCTION: This exam was performed according to the departmental dose-optimization program which includes automated exposure control, adjustment of the mA and/or kV according to patient size and/or use of iterative reconstruction technique. CONTRAST:  18mL OMNIPAQUE IOHEXOL 350 MG/ML SOLN COMPARISON:  CTA head and neck 05/08/2022. FINDINGS: CTA NECK Skeleton: Absent dentition. Prior  sternotomy. No acute osseous abnormality identified. Upper chest: Generalized pulmonary septal thickening and mild ground-glass opacity plus dependent atelectasis in the upper lungs. Capacious proximal esophagus is stable. Other neck: No acute finding. Aortic arch: Stable. Three vessel arch configuration with moderate atherosclerosis. Right carotid system: Stable brachiocephalic artery plaque, tortuous proximal right CCA. Retropharyngeal right carotid bifurcation with calcified plaque appears stable. Less than 50% stenosis. Left carotid system: Stable, chronic left ICA occlusion at its origin with no reconstitution to the skull base. Vertebral arteries: Proximal right subclavian artery tortuosity and calcified plaque is stable without stenosis. Right vertebral artery origin remains normal. Right vertebral remains patent to the skull base without stenosis. Stable proximal left subclavian artery plaque and tortuosity. Left vertebral artery origin remains patent without significant stenosis. Non dominant left vertebral artery appears stable and patent to the skull base without significant stenosis. CTA HEAD Posterior circulation: Stable distal vertebral arteries with tortuosity and dominant right V4 segment but no plaque or stenosis. Patent PICA origins. Patent basilar artery without stenosis. Fetal type right PCA origin redemonstrated. Small right P1. Normal left posterior communicating artery. Patent SCA origins. Bilateral PCA branches are stable, mild P2 irregularity on the left. Anterior circulation: Chronically occluded proximal left ICA siphon with reconstitution at the posterior communicating artery level. Patent although stenotic left ICA terminus. Mild left MCA origin stenosis. Mild to moderate left ACA origin and A1 stenosis appears stable, with dominant contralateral right A1. Left MCA branches appear stable with mild irregularity. Right ICA siphon remains patent with moderate calcified plaque, mild if any  right siphon stenosis. Normal right posterior communicating artery origin. Patent right carotid terminus. Normal right ACA origin. Dominant right A1. Tortuous anterior communicating artery and bilateral ACA branches appear stable. Distal ACA (EGA 4) moderate irregularity and stenosis is stable. Chronically stenotic right MCA origin remains patent, mild-to-moderate stenosis series 11, image 22. Patent right M1 segment which bifurcates early. Right MCA M2 branches are stable and patent. Compared to August, no convincing MCA branch occlusion. Venous sinuses: Early contrast timing, not well evaluated. Anatomic variants: Dominant right vertebral artery, fetal right PCA origin. Dominant right A1. Review of the MIP images confirms the above findings IMPRESSION: 1. Negative for emergent large vessel occlusion. This was discussed by telephone with Dr. Roland Rack on 10/14/2022 at 1041 hours. 2. 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 And stable chronic Intracranial Atherosclerosis which is widespread in the anterior circulation. Notable chronic stenoses of the Left ACA and Right MCA origins are stable. 3. Pulmonary septal thickening and ground-glass opacity in the upper lungs suspicious for acute pulmonary edema. Electronically Signed   By: Odessa Fleming M.D.   On: 10/14/2022 10:59   CT HEAD CODE STROKE WO CONTRAST  Result Date: 10/14/2022 CLINICAL DATA:  Code stroke.  82 year old female. EXAM: CT HEAD WITHOUT CONTRAST TECHNIQUE: Contiguous axial images were obtained from the base of the skull through the vertex without intravenous contrast. RADIATION DOSE REDUCTION: This exam was performed according to the departmental dose-optimization program which includes automated exposure control, adjustment of the mA and/or kV according to patient size and/or use of iterative reconstruction technique. COMPARISON:  Brain MRI 05/08/2022.  Head CT 08/18/2022. FINDINGS: Brain: Chronic  encephalomalacia right ACA and left SCA vascular territories. Superimposed confluent bilateral cerebral white matter hypodensity. Stable gray-white matter differentiation throughout the brain. No midline shift, ventriculomegaly, mass effect, evidence of mass lesion, intracranial hemorrhage or evidence of cortically based acute infarction. Vascular: Calcified atherosclerosis at the skull base. No suspicious intracranial vascular hyperdensity. Skull: No acute osseous abnormality identified. Sinuses/Orbits: Visualized paranasal sinuses and mastoids are stable and well aerated. Other: Largely resolved right periorbital and face hematoma seen in November. No new orbit or scalp soft tissue abnormality identified. ASPECTS [facility] Stroke Program Early CT Score) Total score (0-10 with 10 being normal): 10 (chronic encephalomalacia). IMPRESSION: 1. No acute cortically based infarct or acute intracranial hemorrhage identified. ASPECTS 10. 2. Stable CT appearance of chronic ischemic disease since November. 3. These results were communicated to Dr. Amada Jupiter at 10:36 am on 10/14/2022 by text page via the [facility] messaging system. Electronically Signed   By: Odessa Fleming M.D.   On: 10/14/2022 10:36    Procedures Procedures   Medications Ordered in ED Medications  aspirin EC tablet 81 mg (has no administration in time range)  atorvastatin (LIPITOR) tablet 40 mg (has no administration in time range)  traZODone (DESYREL) tablet 100 mg (has no administration in time range)  apixaban (ELIQUIS) tablet 5 mg (has no administration in time range)  divalproex (DEPAKOTE) DR tablet 500 mg (has no administration in time range)   stroke: early stages of recovery book (has no administration in time range)  0.9 %  sodium chloride infusion (has no administration in time range)  acetaminophen (TYLENOL) tablet 650 mg (has no administration in time range)    Or  acetaminophen (TYLENOL) 160 MG/5ML solution 650 mg (has no administration in  time range)    Or  acetaminophen (TYLENOL) suppository 650 mg (has no administration in time range)  senna-docusate (Senokot-S) tablet 1 tablet (has no administration in time range)  Oral care mouth rinse (has no administration in time range)  Oral care mouth rinse (has no administration in time range)  ondansetron (ZOFRAN) injection 4 mg (has no administration in time range)  HYDROcodone-acetaminophen (NORCO/VICODIN) 5-325 MG per tablet 1 tablet (has no administration in time range)  levothyroxine (SYNTHROID) tablet 137 mcg (has no administration in time range)  sodium chloride flush (NS) 0.9 % injection 3 mL (3 mLs Intravenous Given 10/14/22 1057)  iohexol (OMNIPAQUE) 350 MG/ML injection 75 mL (75 mLs Intravenous Contrast Given 10/14/22 1042)  midazolam (VERSED) injection 1 mg (1 mg Intravenous Given 10/14/22 1552)    ED Course/ Medical Decision Making/ A&P Clinical Course as of 10/14/22 1938  Sat Oct 14, 2022  1024 Discussed with patient's daughter who confirms that she is DNR/DNI.  Dr. Amada Jupiter at the bedside from neurology. [RP]  1201 Discussed with Dr Ophelia Charter from hospitalist. [RP]  1234 EEG negative for acute seizure patient.  [RP]    Clinical Course User Index [RP] Rondel Baton, MD                             Medical Decision Making Amount and/or Complexity of Data Reviewed Labs: ordered. Radiology: ordered.  Risk Decision regarding hospitalization.   Adesuwa Erney is a 81 y.o. female with comorbidities that complicate the patient evaluation including atrial fibrillation on Eliquis and prior stroke who presents to the emergency department with fall, altered mental status, right eye deviation, and left hemibody weakness  Initial Ddx:  Stroke, ICH, seizure, Todd's paralysis, hypoglycemia, elbow fracture, C-spine injury  MDM:  Unclear what caused the patient's fall but she does have reported history of possible seizures and was placed on Depakote for this.  May have  potentially had a seizure that led to her fall and could be having a Todd's paralysis at this time with persistent seizures.  Also possibility is that she has suffered a stroke that caused her to fall so code stroke was activated.  Last known well of 830 a.m. this morning so was within the window for possible thrombectomy but is excluded from TNK due to her Eliquis use.  With her fall also is at risk for ICH and C-spine injury.  Does have elbow pain so we will obtain x-rays to evaluate for fracture but no other obvious abnormalities.  Plan:  Code stroke activation Labs EKG Chest x-ray Right elbow x-ray CTA head and neck CT head  ED Summary/Re-evaluation:  Patient reassessed and did regain some function of her left hemibody but per her daughter who came to the room was persistently confused.  Did have continued right eye deviation so decision was made to start the patient on EEG.  CT of the head did not reveal any acute abnormalities.  CTA without evidence of LVO so patient unable to go for thrombectomy at this time.  Did discuss with neurology who does not feel that we need to treat for nonconvulsive status at this time but will continue to monitor her on EEG.  Recommended admission to medicine for further management.  This patient presents to the ED for concern of complaints listed in HPI, this involves an extensive number of treatment options, and is a complaint that carries with it a high risk of complications and morbidity. Disposition including potential need for admission considered.   Dispo: Admit to Floor  Additional history obtained from daughter Records reviewed Admission Notes The following labs were independently interpreted: Chemistry and show no acute abnormality I independently reviewed the following imaging with scope of interpretation limited to determining acute life threatening conditions related to emergency care: CT Head and agree with the radiologist interpretation with  the following exceptions: None I personally reviewed and interpreted cardiac monitoring: atrial fibrillation (normal rate) I personally reviewed and interpreted the pt's EKG: see above for interpretation  I have reviewed the patients home medications and made adjustments as needed Consults: Hospitalist and Neurology Social Determinants of health:  Elderly  Final Clinical Impression(s) / ED Diagnoses Final diagnoses:  Disorientation  Left-sided weakness  Fall, initial encounter  Traumatic injury of head, initial encounter  Right elbow pain    Rx /  DC Orders ED Discharge Orders     None      CRITICAL CARE Performed by: Rondel Baton   Total critical care time: 30 minutes  Critical care time was exclusive of separately billable procedures and treating other patients.  Critical care was necessary to treat or prevent imminent or life-threatening deterioration.  Critical care was time spent personally by me on the following activities: development of treatment plan with patient and/or surrogate as well as nursing, discussions with consultants, evaluation of patient's response to treatment, examination of patient, obtaining history from patient or surrogate, ordering and performing treatments and interventions, ordering and review of laboratory studies, ordering and review of radiographic studies, pulse oximetry and re-evaluation of patient's condition.    Rondel Baton, MD 10/14/22 [id number]

## 2022-10-14 NOTE — H&P (Signed)
History and Physical    Patient: Carmen [name] JSE:[id number] DOB: December 23, 1940 DOA: 10/14/2022 DOS: the patient was seen and examined on 10/14/2022 PCP: Mort Sawyers, FNP  Patient coming from: ALF/ILF - [facility]F; NOK: Daughter, [place], [phone]   Chief Complaint: Fall  HPI: Carmen [name] is a 82 y.o. female with medical history significant of CAD, HTN, hypothyroidism, afib on Eliquis, and CVA presenting with a fall.  The patient is unaware of why she is here, thinks it is due to SOB.  She is A&O otherwise.  Her daughter reports that she spoke with her this AM about 8 and the patient was fine.  Her daughter showed up 60-90 minutes later and found her mom face down in the floor.  She rolled her over and she appeared to not be breathing and to be blue.  She called for RN assistance and called 911 and the patient sputtered and appeared to start breathing.  The patient has had slowed mentation since a similar prior episode in August.  She has LUE weakness and L-sided neglect without obvious other deficits currently.    ER Course:  AMS, concern for stroke vs. Seizure.  Has episodes of slurred speech at baseline, started on Depakote but not documented seizures.  Found down an hour after talking to her daughter.  CT negative.  Still with L-sided weakness (improving), slurred speech, AMS.  EEG done.  Neurology recommended admission.     Review of Systems: As mentioned in the history of present illness. All other systems reviewed and are negative. Past Medical History:  Diagnosis Date   Abnormal CT scan, lumbar spine (05/11/2021) 05/17/2021   (05/11/2021) LUMBAR CT FINDINGS: Alignment: Lumbar levocurvature, apex L4. Mild lateral listhesis L4 on L5 of approximately 3 mm. Vertebrae: Remote appearing superior endplate deformities at T12 with 10% height loss and L1 with up to 20% height loss. Multilevel discogenic and facet degenerative changes. Mild bilateral SI joint arthrosis.  DISC  LEVELS: T11-T12: Near complete disc height loss with de   Abnormal MRI, cervical spine (05/12/2021) 05/17/2021   (05/12/2021) CERVICAL MRI FINDINGS: Motion artifact is present. Posterior Fossa, vertebral arteries, paraspinal tissues: Left superior cerebellar infarct.   DISC LEVELS: C2-C3: Disc bulge with endplate osteophytes. Uncovertebral and facet hypertrophy. C3-C4: Disc bulge with endplate osteophytes. Uncovertebral and facet hypertrophy. Mild canal stenosis. Marked foraminal stenosis. C4-C5: Disc bulge w   Allergy    Anemia    Anxiety    Arthritis    Back pain    Coronary artery disease    Depression    History of stroke 10/02/2020   Hypertension    Peripheral vascular disease (HCC)    Personal history of nicotine dependence 10/02/2020   Prsnl hx of TIA (TIA), and cereb infrc w/o resid deficits 10/02/2020   Stroke [facility])    Thyroid disease    Past Surgical History:  Procedure Laterality Date   back injections     CARDIAC CATHETERIZATION     CATARACT EXTRACTION     CORONARY ANGIOPLASTY     CORONARY ARTERY BYPASS GRAFT     2005   Social History:  reports that she quit smoking about 2 years ago. Her smoking use included cigarettes. She has never used smokeless tobacco. She reports that she does not currently use alcohol. She reports current drug use. Drug: Hydrocodone.  No Known Allergies  Family History  Problem Relation Age of Onset   Breast cancer Mother    Heart attack Father  Bipolar disorder Daughter    Diabetes Mellitus II Neg Hx     Prior to Admission medications   Medication Sig Start Date End Date Taking? Authorizing Provider  aspirin EC 81 MG tablet Take 81 mg by mouth daily. Swallow whole.    [provider]  atorvastatin (LIPITOR) 40 MG tablet Take 1 tablet (40 mg total) by mouth at bedtime. 08/21/22   Mort Sawyers, FNP  cetirizine (ZYRTEC) 10 MG tablet Take 10 mg by mouth daily.    [provider]  cholecalciferol (VITAMIN D3) 25 MCG (1000 UNIT)  tablet Take 1,000 Units by mouth daily.    [provider]  Cyanocobalamin (VITAMIN B 12 PO) Take by mouth.    [provider]  divalproex (DEPAKOTE) 500 MG DR tablet Take 1 tablet (500 mg total) by mouth at bedtime. 08/28/22 11/26/22  Mort Sawyers, FNP  ELIQUIS 5 MG TABS tablet NEW PRESCRIPTION REQUEST: Eliquis 5 Mg TAKE ONE TABLET BY MOUTH TWICE DAILY 06/21/22   End, Cristal Deer, MD  hydrochlorothiazide (HYDRODIURIL) 25 MG tablet TAKE ONE TABLET BY MOUTH DAILY 07/31/22   End, Cristal Deer, MD  HYDROcodone-acetaminophen (NORCO/VICODIN) 5-325 MG tablet Take 1 tablet by mouth 2 (two) times daily as needed for severe pain. Must last 30 days. 08/28/22 09/27/22  Delano Metz, MD  levothyroxine (SYNTHROID) 125 MCG tablet Take 125 mcg by mouth daily. 09/29/22   [provider]  lidocaine (LIDODERM) 5 % Place 1 patch onto the skin daily. Remove & Discard patch within 12 hours or as directed by MD Patient not taking: Reported on 09/21/2022 07/13/22   Mort Sawyers, FNP  magnesium oxide (MAG-OX) 400 (240 Mg) MG tablet Take 400 mg by mouth daily.    [provider]  metoprolol succinate (TOPROL-XL) 100 MG 24 hr tablet TAKE 1/2 TABLET BY MOUTH AT BEDTIME 07/31/22   End, Cristal Deer, MD  traZODone (DESYREL) 100 MG tablet TAKE 1 TABLET BY MOUTH AT BEDTIME AS NEEDED FOR SLEEP 10/05/22   Mort Sawyers, FNP    Physical Exam: Vitals:   10/14/22 1545 10/14/22 1715 10/14/22 1730 10/14/22 1745  BP: (!) 142/104 (!) 170/109 (!) 152/96 (!) 149/96  Pulse: 97 88 96 83  Resp: 18 18 (!) 22 14  Temp:      TempSrc:      SpO2: 96% 97% 97% 96%   General:  Appears calm and comfortable and is in NAD, facial bruising Eyes:   EOMI, normal lids, iris ENT:   hard of hearing, grossly normal lips & tongue, mmm Neck:  no LAD, masses or thyromegaly Cardiovascular:  Irregularly irregular but generally rate controlled, no m/r/g. No LE edema.  Respiratory:   CTA bilaterally with no  wheezes/rales/rhonchi.  Normal respiratory effort. Abdomen:  soft, NT, ND Skin:  facial bruising (old) Musculoskeletal:  LUE > LLE weakness but this appears to be more due to left-sided neglect than actual weakness Psychiatric:  blunted mood and affect, speech mildly slow and dysarthric but appropriate, AOx3 Neurologic:  R sided-gaze preference with left-sided neglect   Radiological Exams on Admission: Independently reviewed - see discussion in A/P where applicable  MR BRAIN WO CONTRAST  Result Date: 10/14/2022 CLINICAL DATA:  Right gaze preference with concern for stroke EXAM: MRI HEAD WITHOUT CONTRAST TECHNIQUE: Multiplanar, multiecho pulse sequences of the brain and surrounding structures were obtained without intravenous contrast. COMPARISON:  CT and CTA from earlier today FINDINGS: Brain: No acute infarction, hemorrhage, hydrocephalus, extra-axial collection or mass lesion. Chronic left superior cerebellar and right ACA  branch infarcts. Chronic small vessel ischemia in the cerebral white matter. Confluent chronic small vessel ischemia in the deep cerebral white matter. Mild for age cerebral volume loss. 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 Preceding CTA. Skull and upper cervical spine: No focal marrow lesion. Sinuses/Orbits: No emergent finding.  Bilateral cataract resection. Other: Hematoma appearance in the right cheek. IMPRESSION: 1. No acute finding. 2. Extensive chronic small vessel ischemia with chronic right ACA branch and left superior cerebellar infarcts. Electronically Signed   By: Jorje Guild M.D.   On: 10/14/2022 16:56   EEG adult  Result Date: 10/14/2022 Lora Havens, MD     10/14/2022 12:15 PM Patient Name: Dietrich Ke MRN: [id number] Epilepsy Attending: Lora Havens Referring Physician/Provider: Greta Doom, MD Date: 10/14/2022 Duration: 21.34 mins Patient history: 82 year old female with acute left-sided weakness and numbness.  EEG to evaluate for seizure Level of alertness: Awake, asleep AEDs during EEG study: None Technical aspects: This EEG study was done with scalp electrodes positioned according to the 10-20 International system of electrode placement. Electrical activity was reviewed with band pass filter of 1-70Hz , sensitivity of 7 uV/mm, display speed of 38mm/sec with a 60Hz  notched filter applied as appropriate. EEG data were recorded continuously and digitally stored.  Video monitoring was available and reviewed as appropriate. Description: The posterior dominant rhythm consists of 7 Hz activity of moderate voltage (25-35 uV) seen predominantly in posterior head regions, asymmetric ( right<left) and reactive to eye opening and eye closing. Sleep was characterized by vertex waves, sleep spindles (12 to 14 Hz), maximal frontocentral region. EEG showed continuous low amplitude 3 to 6 Hz theta-delta slowing in right hemisphere.Hyperventilation and photic stimulation were not performed.   ABNORMALITY - Continuous slow, right hemisphere IMPRESSION: This study is suggestive of cortical dysfunction arising from right hemisphere, likely secondary to underlying structural abnormality, post-ictal state.No seizures or epileptiform discharges were seen throughout the recording. Lora Havens   DG Elbow Complete Right  Result Date: 10/14/2022 CLINICAL DATA:  RIGHT elbow pain status post fall. EXAM: RIGHT ELBOW - COMPLETE 3+ VIEW COMPARISON:  None Available. FINDINGS: Osseous alignment is normal. No fracture line or displaced fracture fragment is seen. No evidence of underlying joint effusion. Overlying soft tissues are unremarkable. IMPRESSION: Negative. Electronically Signed   By: Franki Cabot M.D.   On: 10/14/2022 11:28   DG Chest 1 View  Result Date: 10/14/2022 CLINICAL DATA:  RIGHT elbow pain status post fall. EXAM: CHEST  1 VIEW COMPARISON:  Chest x-ray dated 10/06/2020. FINDINGS: Stable cardiomegaly. Median sternotomy wires  appear intact and stable in alignment. Mild bilateral interstitial prominence. No confluence opacity to suggest a consolidating pneumonia or alveolar pulmonary edema. No pleural effusion or pneumothorax is seen. No acute-appearing osseous abnormality. IMPRESSION: 1. Cardiomegaly. 2. 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 Electronically Signed   By: Franki Cabot M.D.   On: 10/14/2022 11:27   CT ANGIO HEAD NECK W WO CM (CODE STROKE)  Result Date: 10/14/2022 CLINICAL DATA:  82 year old female with right MCA type deficit. 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 EXAM: CT ANGIOGRAPHY HEAD AND NECK TECHNIQUE: Multidetector CT imaging of the head and neck was performed using the standard protocol during bolus administration of intravenous contrast. Multiplanar CT image reconstructions and MIPs were obtained to evaluate the vascular anatomy. Carotid stenosis measurements (when applicable) are obtained utilizing NASCET criteria, using the distal internal carotid  diameter as the denominator. RADIATION DOSE REDUCTION: This exam was performed according to the departmental dose-optimization program which includes automated exposure control, adjustment of the mA and/or kV according to patient size and/or use of iterative reconstruction technique. CONTRAST:  46mL OMNIPAQUE IOHEXOL 350 MG/ML SOLN COMPARISON:  CTA head and neck 05/08/2022. FINDINGS: CTA NECK Skeleton: Absent dentition. Prior sternotomy. No acute osseous abnormality identified. Upper chest: Generalized pulmonary septal thickening and mild ground-glass opacity plus dependent atelectasis in the upper lungs. Capacious proximal esophagus is stable. Other neck: No acute finding. Aortic arch: Stable. Three vessel arch configuration with moderate atherosclerosis. Right carotid system: Stable brachiocephalic artery plaque, tortuous proximal right CCA. Retropharyngeal right carotid  bifurcation with calcified plaque appears stable. Less than 50% stenosis. Left carotid system: Stable, chronic left ICA occlusion at its origin with no reconstitution to the skull base. Vertebral arteries: Proximal right subclavian artery tortuosity and calcified plaque is stable without stenosis. Right vertebral artery origin remains normal. Right vertebral remains patent to the skull base without stenosis. Stable proximal left subclavian artery plaque and tortuosity. Left vertebral artery origin remains patent without significant stenosis. Non dominant left vertebral artery appears stable and patent to the skull base without significant stenosis. CTA HEAD Posterior circulation: Stable distal vertebral arteries with tortuosity and dominant right V4 segment but no plaque or stenosis. Patent PICA origins. Patent basilar artery without stenosis. Fetal type right PCA origin redemonstrated. Small right P1. Normal left posterior communicating artery. Patent SCA origins. Bilateral PCA branches are stable, mild P2 irregularity on the left. Anterior circulation: Chronically occluded proximal left ICA siphon with reconstitution at the posterior communicating artery level. Patent although stenotic left ICA terminus. Mild left MCA origin stenosis. Mild to moderate left ACA origin and A1 stenosis appears stable, with dominant contralateral right A1. Left MCA branches appear stable with mild irregularity. Right ICA siphon remains patent with moderate calcified plaque, mild if any right siphon stenosis. Normal right posterior communicating artery origin. Patent right carotid terminus. Normal right ACA origin. Dominant right A1. Tortuous anterior communicating artery and bilateral ACA branches appear stable. Distal ACA (EGA 4) moderate irregularity and stenosis is stable. Chronically stenotic right MCA origin remains patent, mild-to-moderate stenosis series 11, image 22. Patent right M1 segment which bifurcates early. Right MCA M2  branches are stable and patent. Compared to August, no convincing MCA branch occlusion. Venous sinuses: Early contrast timing, not well evaluated. Anatomic variants: Dominant right vertebral artery, fetal right PCA origin. Dominant right A1. Review of the MIP images confirms the above findings IMPRESSION: 1. Negative for emergent large vessel occlusion. This was discussed by telephone with Dr. Ritta Slot on 10/14/2022 at 1041 hours. 2. 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 And stable chronic Intracranial Atherosclerosis which is widespread in the anterior circulation. Notable chronic stenoses of the Left ACA and Right MCA origins are stable. 3. Pulmonary septal thickening and ground-glass opacity in the upper lungs suspicious for acute pulmonary edema. Electronically Signed   By: Odessa Fleming M.D.   On: 10/14/2022 10:59   CT HEAD CODE STROKE WO CONTRAST  Result Date: 10/14/2022 CLINICAL DATA:  Code stroke.  82 year old female. EXAM: CT HEAD WITHOUT CONTRAST TECHNIQUE: Contiguous axial images were obtained from the base of the skull through the vertex without intravenous contrast. RADIATION DOSE REDUCTION: This exam was performed according to the departmental dose-optimization program which includes automated exposure control, adjustment of the mA and/or kV according to patient size and/or use of iterative reconstruction  technique. COMPARISON:  Brain MRI 05/08/2022.  Head CT 08/18/2022. FINDINGS: Brain: Chronic encephalomalacia right ACA and left SCA vascular territories. Superimposed confluent bilateral cerebral white matter hypodensity. Stable gray-white matter differentiation throughout the brain. No midline shift, ventriculomegaly, mass effect, evidence of mass lesion, intracranial hemorrhage or evidence of cortically based acute infarction. Vascular: Calcified atherosclerosis at the skull base. No suspicious intracranial vascular hyperdensity. Skull: No  acute osseous abnormality identified. Sinuses/Orbits: Visualized paranasal sinuses and mastoids are stable and well aerated. Other: Largely resolved right periorbital and face hematoma seen in November. No new orbit or scalp soft tissue abnormality identified. ASPECTS [facility] Stroke Program Early CT Score) Total score (0-10 with 10 being normal): 10 (chronic encephalomalacia). IMPRESSION: 1. No acute cortically based infarct or acute intracranial hemorrhage identified. ASPECTS 10. 2. Stable CT appearance of chronic ischemic disease since November. 3. These results were communicated to Dr. Leonel Ramsay at 10:36 am on 10/14/2022 by text page via the [facility] messaging system. Electronically Signed   By: Genevie Ann M.D.   On: 10/14/2022 10:36    EKG: Independently reviewed.  Atrial flutter with rate 98; nonspecific ST changes with no evidence of acute ischemia   Labs on Admission: I have personally reviewed the available labs and imaging studies at the time of the admission.  Pertinent labs:    Glucose 113 WBC 7.6 Hgb 11.4 INR 1.3 ETOH <10   Assessment and Plan: Principal Problem:   Stroke-like symptoms vs. seizure like activity  Active Problems:   Paroxysmal atrial fibrillation (HCC)   Essential hypertension   Hyperlipidemia LDL goal <70   Acquired hypothyroidism   History of stroke   DNR (do not resuscitate)    AMS, fall -Patient presenting with an apparent acute neurologic event that led to apparent apnea -Concern for CVA > seizure, although both are a consideration -Aspirin has been given to reduce stroke mortality and decrease morbidity -Will place observation status for further evaluation -Telemetry monitoring -MRI -Echo -Risk stratification with FLP, A1c; will also check TSH  -Neurology consult -PT/OT/ST/Nutrition Consults -SW consult for placement vs. CIR -Previously started on Depakote but family is unsure if this was for seizures or behavior, will continue  HTN -Allow  permissive HTN for now -Treat BP only if >220/120, and then with goal of 15% reduction -Hold Toprol XL and plan to restart in 48-72 hours   HLD -Check FLP -Continue Lipitor 40 mg daily  Afib -Rate controlled with Toprol XL - holding for permissive HTN for now -Continue Eliquis  Hypothyroidism  -Continue Synthroid  DNR -I have discussed code status with the patient's daughter and SIL and  they are in agreement that the patient would not desire resuscitation and would prefer to die a natural death should that situation arise. -She will need a gold out of facility DNR form at the time of discharge ; form requested by family and I have asked that one be left in the room to allow completion by the rounding doctor tomorrow   Advance Care Planning:   Code Status: DNR   Consults: Neurology; PT/OT/ST; nutrition; TOC team  DVT Prophylaxis: Eliquis  Family Communication: Daughter and son-in-law were present throughout evaluation  Severity of Illness: The appropriate patient status for this patient is OBSERVATION. Observation status is judged to be reasonable and necessary in order to provide the required intensity of service to ensure the patient's safety. The patient's presenting symptoms, physical exam findings, and initial radiographic and laboratory data in the context of their medical condition  is felt to place them at decreased risk for further clinical deterioration. Furthermore, it is anticipated that the patient will be medically stable for discharge from the hospital within 2 midnights of admission.   Author: Jonah Blue, MD 10/14/2022 6:41 PM  For on call review [email or web address].

## 2022-10-14 NOTE — ED Notes (Signed)
Pt placed on 2L [place] while sleeping due to desat in the 80s

## 2022-10-14 NOTE — Procedures (Addendum)
Patient Name: Ladashia Demarinis  MRN: [id number]  Epilepsy Attending: Lora Havens  Referring Physician/Provider: Greta Doom, MD  Date: 10/14/2022 Duration: 21.34 mins  Patient history: 82 year old female with acute left-sided weakness and numbness. EEG to evaluate for seizure  Level of alertness: Awake, asleep  AEDs during EEG study: None  Technical aspects: This EEG study was done with scalp electrodes positioned according to the 10-20 International system of electrode placement. Electrical activity was reviewed with band pass filter of 1-70Hz , sensitivity of 7 uV/mm, display speed of 26mm/sec with a 60Hz  notched filter applied as appropriate. EEG data were recorded continuously and digitally stored.  Video monitoring was available and reviewed as appropriate.  Description: The posterior dominant rhythm consists of 7 Hz activity of moderate voltage (25-35 uV) seen predominantly in posterior head regions, asymmetric ( right<left) and reactive to eye opening and eye closing. Sleep was characterized by vertex waves, sleep spindles (12 to 14 Hz), maximal frontocentral region. EEG showed continuous low amplitude 3 to 6 Hz theta-delta slowing in right hemisphere.Hyperventilation and photic stimulation were not performed.     ABNORMALITY - Continuous slow, right hemisphere  IMPRESSION: This study is suggestive of cortical dysfunction arising from right hemisphere, likely secondary to underlying structural abnormality, post-ictal state.No seizures or epileptiform discharges were seen throughout the recording.   Keyarah Mcroy Barbra Sarks

## 2022-10-14 NOTE — Progress Notes (Signed)
EEG complete - results pending 

## 2022-10-14 NOTE — Code Documentation (Signed)
Stroke Response Nurse Documentation Code Documentation  Carmen [name] is a 82 y.o. female arriving to [facility]  via [facility] on 10-14-2022 with past medical hx of PVD, AF, HTN, CAD. On Eliquis (apixaban) daily. Code stroke was activated by EMS.   Patient from Assisted living facility where she was LKW at 0830 and now complaining of Left weakness, right gaze and dysarthric.  Per EMS she was found about 0910 face down on the floor, she was blue and agonally breathing.  Ambu bag ventilation during transport.  Upon arrival she was stuporous.  After stimulation she maintained alertness/drowsiness.    Stroke team at the bedside on patient arrival. Labs drawn and patient cleared for CT by Dr. Philip Aspen. Patient to CT with team. NIHSS 18, see documentation for details and code stroke times. Patient with decreased LOC, right gaze preference , left hemianopia, left facial droop, left arm weakness, left leg weakness, left decreased sensation, dysarthria , and left neglect on exam. The following imaging was completed:  CT Head and CTA. Patient is not a candidate for IV Thrombolytic due to taking eliquis as perscribed. Patient is not a candidate for IR due to no LVO on CTA.   Care Plan: NIHSS and VS q 2 hours.   Bedside handoff with ED RN [place].    Raliegh Ip  Stroke Response RN

## 2022-10-14 NOTE — Consult Note (Signed)
Neurology Consultation Reason for Consult: Left-sided weakness Referring Physician: Sharlett Iles, R  CC: Left-sided weakness  History is obtained from: Patient, daughter  HPI: Carmen [name] is a 82 y.o. 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  She was last known well about 8:30 AM when she talked to her daughter on the phone.  She was in the process of getting her laundry ready to fold and then fell.  She is unclear why she fell.  On EMS arrival, they found her with decreased level of responsiveness with left-sided weakness and therefore activated code stroke.  They did bag mask ventilate her en route due to her sats following, but this was stopped on arrival to the ED and she seemed to be protecting her airway and breathing okay.  I greeted her at the door and then accompanied the patient for an emergent CT/CTA which is negative.  Of note she had an episode of concern with right-sided weakness and stiffening that occurred last August at which time MRI and EEG were done which were both negative.  LKW: 830 tnk given?: no, anticoagulated Premorbid modified rankin scale: 2  Past Medical History:  Diagnosis Date   Abnormal CT scan, lumbar spine (05/11/2021) 05/17/2021   (05/11/2021) LUMBAR CT FINDINGS: Alignment: Lumbar levocurvature, apex L4. Mild lateral listhesis L4 on L5 of approximately 3 mm. Vertebrae: Remote appearing superior endplate deformities at T12 with 10% height loss and L1 with up to 20% height loss. Multilevel discogenic and facet degenerative changes. Mild bilateral SI joint arthrosis.  DISC LEVELS: T11-T12: Near complete disc height loss with de   Abnormal MRI, cervical spine (05/12/2021) 05/17/2021   (05/12/2021) CERVICAL MRI FINDINGS: Motion artifact is present. Posterior Fossa, vertebral arteries, paraspinal tissues: Left superior cerebellar infarct.   DISC LEVELS: C2-C3:  Disc bulge with endplate osteophytes. Uncovertebral and facet hypertrophy. C3-C4: Disc bulge with endplate osteophytes. Uncovertebral and facet hypertrophy. Mild canal stenosis. Marked foraminal stenosis. C4-C5: Disc bulge w   Allergy    Anemia    Anxiety    Arthritis    Back pain    Coronary artery disease    Depression    History of stroke 10/02/2020   Hypertension    Peripheral vascular disease (HCC)    Personal history of nicotine dependence 10/02/2020   Prsnl hx of TIA (TIA), and cereb infrc w/o resid deficits 10/02/2020   Stroke [facility])    Thyroid disease      Family History  Problem Relation Age of Onset   Breast cancer Mother    Heart attack Father    Bipolar disorder Daughter    Diabetes Mellitus II Neg Hx      Social History:  reports that she quit smoking about 2 years ago. Her smoking use included cigarettes. She has never used smokeless tobacco. She reports that she does not currently use alcohol. She reports current drug use. Drug: Hydrocodone.   Exam: Current vital signs: Vitals:   10/14/22 1030  BP: (!) 172/138  Pulse: (!) 105  Resp: (!) 26  SpO2: 99%    Vital signs in last 24 hours:     Physical Exam  Appears well-developed and well-nourished.   Neuro: Mental Status: Patient is awake, alert, oriented to person, month and age No signs of aphasia  She has anosagnosia without recognition of her deficits on the left. Cranial Nerves: II: She has a left hemianopia. Pupils are  equal, round, and reactive to light.   III,IV, VI: She has a rightward gaze, with forced deviation unable to cross midline to the left V: VII: Facial movement is weak on the left Motor: She has significant left hemiparesis with 3-4/5 strength throughout the left side sensory: Sensation is diminished on the left she is unable to tell me exactly where I am pinching when I pinch her left hand. Cerebellar: Does not perform but no definite ataxia     I have reviewed labs in epic  and the results pertinent to this consultation are: CMP is unremarkable  I have reviewed the images obtained: CT head/CTA-negative acute  Impression: 82 year old female with acute left-sided weakness and numbness.  Her presentation is most consistent with a acute ischemic infarct, but I would expect a large vessel occlusion.  Given the lack of clear etiology I will rule out other etiologies such as seizure especially with her history of the episode of back in August.   Recommendations: 1) stat EEG 2) MRI brain 3) further workup and management dependent on the above studies.  This patient is critically ill and at significant risk of neurological worsening, death and care requires constant monitoring of vital signs, hemodynamics,respiratory and cardiac monitoring, neurological assessment, discussion with family, other specialists and medical decision making of high complexity. I spent 39 minutes of neurocritical care time  in the care of  this patient. This was time spent independent of any time provided by nurse practitioner or PA.  Roland Rack, MD [facility] [phone]  If 7pm- 7am, please page neurology on call as listed in [place]. 10/14/2022  11:03 AM

## 2022-10-14 NOTE — ED Notes (Signed)
PT returned from MRI and did not know where her hearing aid was.  MRI does not have it, we cannot find it in the room, in the linen or in the hallway.  I remember it being in her ear prior to MRI as I medicated her for the MRI because the daughter and I discussed whether to remove it or not.  We decided not because the daughter said it had always come back from MRI in the past and I agreed it should be ok.  PT and Daughter are aware that I have looked everywhere and MRI says they have done the same and it cannot be found.   I told Roselyn Reef -CN and she advised to send pt info and situation to [place] [employer] so I told the family I would do that.  The email has been sent.

## 2022-10-14 NOTE — ED Notes (Signed)
Chicken boullion provided

## 2022-10-14 NOTE — ED Triage Notes (Signed)
Pt BIB EMS due to a code stroke. Pt LSN was 0830. Around 0910 pt was found unresponsive/ agonal breathing on floor. Pt was having slurred speech/ right sided facial droop/ gaze. Pt is on elliquis for past stroke. Pt arrives to ED being bagged.

## 2022-10-15 ENCOUNTER — Encounter (HOSPITAL_COMMUNITY): Payer: Self-pay | Admitting: Internal Medicine

## 2022-10-15 NOTE — Progress Notes (Signed)
Mitzi Hansen MD notified of daughter's request to leave AMA. Provider recommends patient to stay for neurology consult and recommendations this am. Daughter informed of risks of leaving AMA and verbalizes comprehension. AMA form completed and patient leaves with daughter via private vehicle.

## 2022-10-15 NOTE — Progress Notes (Signed)
Notified [employer] on lost hearing aids. Jimmy Soil scientist to bedside and report filed as requested by daughter.  Security states protocol for lost items will be followed which is logging incident in a "lost & found" book as well as contact information. Daughter informed of above.

## 2022-10-16 ENCOUNTER — Other Ambulatory Visit: Payer: Self-pay | Admitting: Family

## 2022-10-16 DIAGNOSIS — E039 Hypothyroidism, unspecified: Secondary | ICD-10-CM

## 2022-10-17 ENCOUNTER — Ambulatory Visit: Payer: Medicare Other | Admitting: Pain Medicine

## 2022-10-19 NOTE — Progress Notes (Addendum)
PROVIDER NOTE: Information contained herein reflects review and annotations entered in association with encounter. Interpretation of such information and data should be left to medically-trained personnel. Information provided to patient can be located elsewhere in the medical record under "Patient Instructions". Document created using STT-dictation technology, any transcriptional errors that may result from process are unintentional.    Patient: Carmen [name]  Service Category: E/M  Provider: Oswaldo Done, MD  DOB: 09/27/1941  DOS: 10/23/2022  Referring Provider: Mort Sawyers, FNP  MRN: [id number]  Specialty: Interventional Pain Management  PCP: Mort Sawyers, FNP  Type: Established Patient  Setting: Ambulatory outpatient    Location: Office  Delivery: Face-to-face     HPI  [name], an 82 y.o. year old female, is here today because of her Chronic pain syndrome [G89.4]. [name] primary complain today is Leg Pain (right) Last encounter: My last encounter with her was on 09/07/2022. Pertinent problems: [name] has Peripheral vascular disease (HCC); Other chronic pain; Chronic low back pain (1ry area of Pain) (Bilateral) (R>L) w/o sciatica; Dorsalgia, unspecified; Chronic pain syndrome; Chronic neck pain (2ry area of Pain) (Bilateral) (L>R); Chronic lower extremity pain (3ry area of Pain) (Bilateral) (R>L); Chronic hand pain (Left); Chronic hand pain (Right); Lumbar compression fracture (Old) (Multilevel), sequela; Chronic cervical radiculopathy (Bilateral); DDD (degenerative disc disease), cervical; Foraminal stenosis of cervical region; Lumbar facet syndrome (Bilateral); Lumbosacral facet hypertrophy (Multilevel) (Bilateral); Lumbar facet arthropathy (Multilevel) (Bilateral); 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HCC); Cervical facet syndrome (Bilateral); Cervical facet hypertrophy (Multilevel) (Bilateral); Spondylosis without myelopathy or radiculopathy, cervical region;  Spondylosis without myelopathy or radiculopathy, lumbosacral region; Other intervertebral disc degeneration, lumbar region; DDD (degenerative disc disease), lumbosacral; Levoscoliosis of lumbar spine; Lumbosacral foraminal stenosis (Multilevel) (Bilateral); Degenerative lateral spondylolisthesis of L4/L5 (76mm); Baastrup's syndrome; Unsteady gait when walking; Chronic hip pain (Right); Greater trochanteric bursitis (Right); Chronic lower extremity pain (Right); and Osteoarthritis of hip (Right) on their pertinent problem list. Pain Assessment: Severity of Chronic pain is reported as a 10-Worst pain ever/10. Location: Leg Right/pain radiaities down her right side. Onset: More than a month ago. Quality: Aching, Burning, Constant, Throbbing, Stabbing, Nagging, Moaning. Timing: Constant. Modifying factor(s): Meds, laying down. Vitals:  height is 5' (1.524 m) and weight is 193 lb (87.5 kg). Her temperature is 97.1 F (36.2 C) (abnormal). Her blood pressure is 117/69 and her pulse is 76. Her oxygen saturation is 99%.  BMI: Estimated body mass index is 37.69 kg/m as calculated from the following:   Height as of this encounter: 5' (1.524 m).   Weight as of this encounter: 193 lb (87.5 kg).  Reason for encounter: medication management.  The patient comes into the clinic today on a wheelchair still with a bruise on her left eye from a previous fall.  The patient indicates doing well with the current medication regimen. No adverse reactions or side effects reported to the medications.  She comes in accompanied by the daughter who indicates that she forgot to bring the bottle.  She also indicates that they apparently lost one of her hearing aids at the hospital and a recent admission.  According to the patient and the daughter she is doing great on the side that we did the radiofrequency ablation.  The plan was to do the right side afterwards, but they want to wait until she sees the cardiologist and a rheumatologist.   They have an appointment for both.  She is still on Eliquis anticoagulation.  Currently she refers that her worst pain  is that of the lower back followed by referred pain down the right lower extremity through the posterior lateral aspect of the leg, not reaching the foot.  Today I have informed them that these procedures are elective and therefore it is more important for her to address her cardiovascular issues first and once she is on a stable condition, we can revisit the possibility of further interventional therapies.  They understood and agreed.  Today the daughter has agreed to come back later on in the day with the medication bottle for a full pill count.  She only brought the container that she uses to provide the patient with the pills for the week.  RTCB: 01/21/2023   Pharmacotherapy Assessment  Analgesic: Hydrocodone/APAP 5/325, 1 tab p.o. BID (10 mg/day of hydrocodone) (10 MME) (last filled on 05/02/2021) MME/day: 10 mg/day   Monitoring: [place] PMP: PDMP reviewed during this encounter.       Pharmacotherapy: No side-effects or adverse reactions reported. Compliance: No problems identified. Effectiveness: Clinically acceptable.  Carmen Specking, RN  10/23/2022 10:19 AM  Signed Nursing Pain Medication Assessment:  Safety precautions to be maintained throughout the outpatient stay will include: orient to surroundings, keep bed in low position, maintain call bell within reach at all times, provide assistance with transfer out of bed and ambulation.  Medication Inspection Compliance: Pill count conducted under aseptic conditions, in front of the patient. Neither the pills nor the bottle was removed from the patient's sight at any time. Once count was completed pills were immediately returned to the patient in their original bottle.  Medication: Hydrocodone/APAP Pill/Patch Count:  14 of 60 pills remain Pill/Patch Appearance: Markings consistent with prescribed medication Bottle  Appearance: Standard pharmacy container. Clearly labeled. Filled Date: 62 / 30 / 2023 Last Medication intake:  Today    No results found for: "CBDTHCR" No results found for: "D8THCCBX" No results found for: "D9THCCBX"  UDS:  Summary  Date Value Ref Range Status  04/18/2022 Note  Final    Comment:    ==================================================================== [employer] [facility] 13 (MW) ==================================================================== Test                             Result       Flag       Units  Drug Present and Declared for Prescription Verification   7-aminoclonazepam              111          EXPECTED   ng/mg creat    7-aminoclonazepam is an expected metabolite of clonazepam. Source of    clonazepam is a scheduled prescription medication.    Hydrocodone                    1142         EXPECTED   ng/mg creat   Hydromorphone                  151          EXPECTED   ng/mg creat   Dihydrocodeine                 126          EXPECTED   ng/mg creat   Norhydrocodone                 1954         EXPECTED   ng/mg creat  Sources of hydrocodone include scheduled prescription medications.    Hydromorphone, dihydrocodeine and norhydrocodone are expected    metabolites of hydrocodone. Hydromorphone and dihydrocodeine are    also available as scheduled prescription medications.  ==================================================================== Test                      Result    Flag   Units      Ref Range   Creatinine              57               mg/dL      >=46 ==================================================================== Declared Medications:  The flagging and interpretation on this report are based on the  following declared medications.  Unexpected results may arise from  inaccuracies in the declared medications.   **Note: The testing scope of this panel includes these medications:   Clonazepam (Klonopin)  Hydrocodone (Norco)   **Note: The  testing scope of this panel does not include the  following reported medications:   Acetaminophen (Norco)  Apixaban (Eliquis)  Aspirin  Atorvastatin (Lipitor)  Calcium  Cetirizine (Zyrtec)  Hydrochlorothiazide  Levothyroxine (Synthroid)  Metoprolol (Toprol)  Trazodone (Desyrel) ==================================================================== For clinical consultation, please call [phone]. ====================================================================       ROS  Constitutional: Denies any fever or chills Gastrointestinal: No reported hemesis, hematochezia, vomiting, or acute GI distress Musculoskeletal: Denies any acute onset joint swelling, redness, loss of ROM, or weakness Neurological: No reported episodes of acute onset apraxia, aphasia, dysarthria, agnosia, amnesia, paralysis, loss of coordination, or loss of consciousness  Medication Review  Calcium, HYDROcodone-acetaminophen, apixaban, aspirin EC, atorvastatin, cephALEXin, divalproex, furosemide, levothyroxine, magnesium oxide, metoprolol succinate, multivitamin, naloxone, and traZODone  History Review  Allergy: Ms. Westhoff has No Known Allergies. Drug: Ms. Vanwyhe  reports current drug use. Drug: Hydrocodone. Alcohol:  reports that she does not currently use alcohol. Tobacco:  reports that she quit smoking about 2 years ago. Her smoking use included cigarettes. She has never used smokeless tobacco. Social: Ms. Nicholson  reports that she quit smoking about 2 years ago. Her smoking use included cigarettes. She has never used smokeless tobacco. She reports that she does not currently use alcohol. She reports current drug use. Drug: Hydrocodone. Medical:  has a past medical history of Abnormal CT scan, lumbar spine (05/11/2021) (05/17/2021), Abnormal MRI, cervical spine (05/12/2021) (05/17/2021), Allergy, Anemia, Anxiety, Arthritis, Back pain, Coronary artery disease, Depression, History of stroke (10/02/2020), Hypertension,  Peripheral vascular disease (HCC), Personal history of nicotine dependence (10/02/2020), Prsnl hx of TIA (TIA), and cereb infrc w/o resid deficits (10/02/2020), Stroke [facility]), and Thyroid disease. Surgical: Ms. Frein  has a past surgical history that includes Cardiac catheterization; Coronary angioplasty; Coronary artery bypass graft; back injections; and Cataract extraction. Family: family history includes Bipolar disorder in her daughter; Breast cancer in her mother; Heart attack in her father.  Laboratory Chemistry Profile   Renal Lab Results  Component Value Date   BUN 18 10/14/2022   CREATININE 0.90 10/14/2022   BCR 22 01/11/2022   GFR 67.85 10/18/2021   GFRAA 73 11/22/2020   GFRNONAA >60 10/14/2022    Hepatic Lab Results  Component Value Date   AST 30 10/14/2022   ALT 26 10/14/2022   ALBUMIN 3.8 10/14/2022   ALKPHOS 39 10/14/2022   AMMONIA 19 10/06/2020    Electrolytes Lab Results  Component Value Date   NA 137 10/14/2022   K 4.5 10/14/2022   CL 101 10/14/2022  CALCIUM 8.9 10/14/2022   MG 1.6 06/22/2022    Bone Lab Results  Component Value Date   25OHVITD1 12 (L) 03/14/2021   25OHVITD2 <1.0 03/14/2021   25OHVITD3 12 03/14/2021    Inflammation (CRP: Acute Phase) (ESR: Chronic Phase) Lab Results  Component Value Date   CRP 0.8 03/14/2021   ESRSEDRATE 3 08/01/2022         Note: Above Lab results reviewed.  Recent Imaging Review  MR BRAIN WO CONTRAST CLINICAL DATA:  Right gaze preference with concern for stroke  EXAM: MRI HEAD WITHOUT CONTRAST  TECHNIQUE: Multiplanar, multiecho pulse sequences of the brain and surrounding structures were obtained without intravenous contrast.  COMPARISON:  CT and CTA from earlier today  FINDINGS: Brain: No acute infarction, hemorrhage, hydrocephalus, extra-axial collection or mass lesion. 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 Chronic small vessel ischemia in the cerebral white matter. Confluent  chronic small vessel ischemia in the deep cerebral white matter. Mild for age cerebral volume loss.  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 Preceding CTA.  Skull and upper cervical spine: No focal marrow lesion.  Sinuses/Orbits: No emergent finding.  Bilateral cataract resection.  Other: Hematoma appearance in the right cheek.  IMPRESSION: 1. No acute finding. 2. Extensive chronic small vessel ischemia with chronic right ACA branch and left superior cerebellar infarcts.  Electronically Signed   By: Tiburcio Pea M.D.   On: 10/14/2022 16:56 EEG adult Charlsie Quest, MD     10/14/2022 12:15 PM Patient Name: Mendi Constable  MRN: [id number]  Epilepsy Attending: Charlsie Quest  Referring Physician/Provider: Rejeana Brock, MD  Date: 10/14/2022 Duration: 21.34 mins  Patient history: 82 year old female with acute left-sided  weakness and numbness. EEG to evaluate for seizure  Level of alertness: Awake, asleep  AEDs during EEG study: None  Technical aspects: This EEG study was done with scalp electrodes  positioned according to the 10-20 International system of  electrode placement. Electrical activity was reviewed with band  pass filter of 1-70Hz , sensitivity of 7 uV/mm, display speed of  49mm/sec with a 60Hz  notched filter applied as appropriate. EEG  data were recorded continuously and digitally stored.  Video  monitoring was available and reviewed as appropriate.  Description: The posterior dominant rhythm consists of 7 Hz  activity of moderate voltage (25-35 uV) seen predominantly in  posterior head regions, asymmetric ( right<left) and reactive to  eye opening and eye closing. Sleep was characterized by vertex  waves, sleep spindles (12 to 14 Hz), maximal frontocentral  region. EEG showed continuous low amplitude 3 to 6 Hz theta-delta  slowing in right hemisphere.Hyperventilation and photic  stimulation were not performed.      ABNORMALITY - Continuous slow, right hemisphere  IMPRESSION: This study is suggestive of cortical dysfunction arising from  right hemisphere, likely secondary to underlying structural  abnormality, post-ictal state.No seizures or epileptiform  discharges were seen throughout the recording.  Priyanka O Yadav  DG Elbow Complete Right CLINICAL DATA:  RIGHT elbow pain status post fall.  EXAM: RIGHT ELBOW - COMPLETE 3+ VIEW  COMPARISON:  None Available.  FINDINGS: Osseous alignment is normal. No fracture line or displaced fracture fragment is seen. No evidence of underlying joint effusion. Overlying soft tissues are unremarkable.  IMPRESSION: Negative.  Electronically Signed   By: M.D.   On: 10/14/2022 11:28 DG Chest 1 View CLINICAL DATA:  RIGHT elbow pain status post fall.  EXAM: CHEST  1 VIEW  COMPARISON:  Chest x-ray dated 10/06/2020.  FINDINGS: Stable cardiomegaly. Median sternotomy wires appear intact and stable in alignment. Mild bilateral interstitial prominence. No confluence opacity to suggest a consolidating pneumonia or alveolar pulmonary edema. No pleural effusion or pneumothorax is seen. No acute-appearing osseous abnormality.  IMPRESSION: 1. Cardiomegaly. 2. 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  Electronically Signed   By: Bary Richard M.D.   On: 10/14/2022 11:27 CT ANGIO HEAD NECK W WO CM (CODE STROKE) CLINICAL DATA:  82 year old female with right MCA type deficit. 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  EXAM: CT ANGIOGRAPHY HEAD AND NECK  TECHNIQUE: Multidetector CT imaging of the head and neck was performed using the standard protocol during bolus administration of intravenous contrast. Multiplanar CT image reconstructions and MIPs were obtained to evaluate the vascular anatomy. Carotid stenosis measurements (when applicable) are  obtained utilizing NASCET criteria, using the distal internal carotid diameter as the denominator.  RADIATION DOSE REDUCTION: This exam was performed according to the departmental dose-optimization program which includes automated exposure control, adjustment of the mA and/or kV according to patient size and/or use of iterative reconstruction technique.  CONTRAST:  79mL OMNIPAQUE IOHEXOL 350 MG/ML SOLN  COMPARISON:  CTA head and neck 05/08/2022.  FINDINGS: CTA NECK  Skeleton: Absent dentition. Prior sternotomy. No acute osseous abnormality identified.  Upper chest: Generalized pulmonary septal thickening and mild ground-glass opacity plus dependent atelectasis in the upper lungs. Capacious proximal esophagus is stable.  Other neck: No acute finding.  Aortic arch: Stable. Three vessel arch configuration with moderate atherosclerosis.  Right carotid system: Stable brachiocephalic artery plaque, tortuous proximal right CCA. Retropharyngeal right carotid bifurcation with calcified plaque appears stable. Less than 50% stenosis.  Left carotid system: Stable, chronic left ICA occlusion at its origin with no reconstitution to the skull base.  Vertebral arteries: Proximal right subclavian artery tortuosity and calcified plaque is stable without stenosis. Right vertebral artery origin remains normal. Right vertebral remains patent to the skull base without stenosis.  Stable proximal left subclavian artery plaque and tortuosity. Left vertebral artery origin remains patent without significant stenosis. Non dominant left vertebral artery appears stable and patent to the skull base without significant stenosis.  CTA HEAD  Posterior circulation: Stable distal vertebral arteries with tortuosity and dominant right V4 segment but no plaque or stenosis. Patent PICA origins. Patent basilar artery without stenosis. Fetal type right PCA origin redemonstrated. Small right P1. Normal  left posterior communicating artery. Patent SCA origins. Bilateral PCA branches are stable, mild P2 irregularity on the left.  Anterior circulation: Chronically occluded proximal left ICA siphon with reconstitution at the posterior communicating artery level. Patent although stenotic left ICA terminus. Mild left MCA origin stenosis. Mild to moderate left ACA origin and A1 stenosis appears stable, with dominant contralateral right A1. Left MCA branches appear stable with mild irregularity.  Right ICA siphon remains patent with moderate calcified plaque, mild if any right siphon stenosis. Normal right posterior communicating artery origin. Patent right carotid terminus. Normal right ACA origin. Dominant right A1.  Tortuous anterior communicating artery and bilateral ACA branches appear stable. Distal ACA (EGA 4) moderate irregularity and stenosis is stable.  Chronically stenotic right MCA origin remains patent, mild-to-moderate stenosis series 11, image 22. Patent right M1 segment which bifurcates early. Right MCA M2 branches are stable and patent. Compared to August, no convincing MCA branch occlusion.  Venous sinuses: Early contrast timing, not well evaluated.  Anatomic variants: Dominant right vertebral artery, fetal right PCA origin. Dominant right A1.  Review of the MIP images confirms the above findings  IMPRESSION: 1. Negative for emergent large vessel occlusion. This was discussed by telephone with Dr. Ritta Slot on 10/14/2022 at 1041 hours.  2. 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 And stable chronic Intracranial Atherosclerosis which is widespread in the anterior circulation. Notable chronic stenoses of the Left ACA and Right MCA origins are stable.  3. Pulmonary septal thickening and ground-glass opacity in the upper lungs suspicious for acute pulmonary edema.  Electronically Signed   By: Odessa Fleming M.D.    On: 10/14/2022 10:59 CT HEAD CODE STROKE WO CONTRAST CLINICAL DATA:  Code stroke.  82 year old female.  EXAM: CT HEAD WITHOUT CONTRAST  TECHNIQUE: Contiguous axial images were obtained from the base of the skull through the vertex without intravenous contrast.  RADIATION DOSE REDUCTION: This exam was performed according to the departmental dose-optimization program which includes automated exposure control, adjustment of the mA and/or kV according to patient size and/or use of iterative reconstruction technique.  COMPARISON:  Brain MRI 05/08/2022.  Head CT 08/18/2022.  FINDINGS: Brain: Chronic encephalomalacia right ACA and left SCA vascular territories. Superimposed confluent bilateral cerebral white matter hypodensity.  Stable gray-white matter differentiation throughout the brain. No midline shift, ventriculomegaly, mass effect, evidence of mass lesion, intracranial hemorrhage or evidence of cortically based acute infarction.  Vascular: Calcified atherosclerosis at the skull base. No suspicious intracranial vascular hyperdensity.  Skull: No acute osseous abnormality identified.  Sinuses/Orbits: Visualized paranasal sinuses and mastoids are stable and well aerated.  Other: Largely resolved right periorbital and face hematoma seen in November. No new orbit or scalp soft tissue abnormality identified.  ASPECTS [facility] Stroke Program Early CT Score)  Total score (0-10 with 10 being normal): 10 (chronic encephalomalacia).  IMPRESSION: 1. No acute cortically based infarct or acute intracranial hemorrhage identified. ASPECTS 10. 2. Stable CT appearance of chronic ischemic disease since November. 3. These results were communicated to Dr. Amada Jupiter at 10:36 am on 10/14/2022 by text page via the [facility] messaging system.  Electronically Signed   By: Odessa Fleming M.D.   On: 10/14/2022 10:36 Note: Reviewed        Physical Exam  General appearance: Well nourished, well  developed, and well hydrated. In no apparent acute distress Mental status: Alert, oriented x 3 (person, place, & time)       Respiratory: No evidence of acute respiratory distress Eyes: PERLA Vitals: BP 117/69   Pulse 76   Temp (!) 97.1 F (36.2 C)   Ht 5' (1.524 m)   Wt 193 lb (87.5 kg)   SpO2 99%   BMI 37.69 kg/m  BMI: Estimated body mass index is 37.69 kg/m as calculated from the following:   Height as of this encounter: 5' (1.524 m).   Weight as of this encounter: 193 lb (87.5 kg). Ideal: Ideal body weight: 45.5 kg (100 lb 4.9 oz) Adjusted ideal body weight: 62.3 kg (137 lb 6.2 oz)  Assessment   Diagnosis Status  1. Chronic pain syndrome   2. Chronic low back pain (1ry area of Pain) (Bilateral) (L>R) w/o sciatica   3. Chronic neck pain (2ry area of Pain) (Bilateral) (L>R)   4. Chronic lower extremity pain (3ry area of Pain) (Bilateral) (L>R)   5. Lumbar facet joint syndrome (Bilateral)   6. Pharmacologic therapy   7. Chronic use of opiate for therapeutic purpose  8. Encounter for medication management   9. Encounter for chronic pain management    Controlled Controlled Controlled   Updated Problems: No problems updated.  Plan of Care  Problem-specific:  No problem-specific Assessment & Plan notes found for this encounter.  Ms. Kaydan Poehlman has a current medication list which includes the following long-term medication(s): atorvastatin, calcium, divalproex, eliquis, furosemide, levothyroxine, metoprolol succinate, trazodone, hydrocodone-acetaminophen, [START ON 11/22/2022] hydrocodone-acetaminophen, and [START ON 12/22/2022] hydrocodone-acetaminophen.  Pharmacotherapy (Medications Ordered): Meds ordered this encounter  Medications   HYDROcodone-acetaminophen (NORCO/VICODIN) 5-325 MG tablet    Sig: Take 1 tablet by mouth 2 (two) times daily as needed for severe pain. Must last 30 days.    Dispense:  60 tablet    Refill:  0    DO NOT: delete (not duplicate); no  partial-fill (will deny script to complete), no refill request (F/U required). DISPENSE: 1 day early if closed on fill date. WARN: No CNS-depressants within 8 hrs of med.   naloxone (NARCAN) nasal spray 4 mg/0.1 mL    Sig: Place 1 spray into the nose as needed for up to 365 doses (for opioid-induced respiratory depresssion). In case of emergency (overdose), spray once into each nostril. If no response within 3 minutes, repeat application and call 911.    Dispense:  1 each    Refill:  0    Instruct patient in proper use of device.   HYDROcodone-acetaminophen (NORCO/VICODIN) 5-325 MG tablet    Sig: Take 1 tablet by mouth 2 (two) times daily as needed for severe pain. Must last 30 days.    Dispense:  60 tablet    Refill:  0    DO NOT: delete (not duplicate); no partial-fill (will deny script to complete), no refill request (F/U required). DISPENSE: 1 day early if closed on fill date. WARN: No CNS-depressants within 8 hrs of med.   HYDROcodone-acetaminophen (NORCO/VICODIN) 5-325 MG tablet    Sig: Take 1 tablet by mouth 2 (two) times daily as needed for severe pain. Must last 30 days.    Dispense:  60 tablet    Refill:  0    DO NOT: delete (not duplicate); no partial-fill (will deny script to complete), no refill request (F/U required). DISPENSE: 1 day early if closed on fill date. WARN: No CNS-depressants within 8 hrs of med.   Orders:  No orders of the defined types were placed in this encounter.  Follow-up plan:   Return in about 3 months (around 01/21/2023) for Eval-day (M,W), (F2F), (MM).     Interventional Therapies  Risk  Complexity Considerations:   ELIQUIS + ASA Anticoagulation (Stop: 3 days  Restart: 6 hours)   Planned  Pending:      Under consideration:   Diagnostic/therapeutic cervical ESI  Therapeutic left lumbar facet RFA #1  Diagnostic/therapeutic right L4-5 LESI #1  Diagnostic/therapeutic right L4 and L5 TFESI #1    Completed:   Diagnostic/Therapeutic right IA Hip &  TBI inj. x2 (08/22/2022) (100/100/100/90-95)  Therapeutic right lumbar facet RFA x1 (06/13/2022) (100/100/100/90-100)  Diagnostic right lumbar facet MBB x3 (01/26/2022) (100/100/90/90)  Diagnostic left lumbar facet MBB x3 (01/26/2022) (100/100/90/90)  Diagnostic left cervical facet MBB x1 (06/28/2021) (25/25/0/0)    Therapeutic  Palliative (PRN) options:   Therapeutic lumbar facet MBB      Recent Visits Date Type Provider Dept  09/07/22 Office Visit Delano Metz, MD [facility]  08/22/22 Procedure visit Delano Metz, MD [facility]  Showing recent visits within past 90 days and  meeting all other requirements Today's Visits Date Type Provider Dept  10/23/22 Office Visit Milinda Pointer, MD [facility]  Showing today's visits and meeting all other requirements Future Appointments Date Type Provider Dept  01/15/23 Appointment Milinda Pointer, MD [facility]  Showing future appointments within next 90 days and meeting all other requirements  I discussed the assessment and treatment plan with the patient. The patient was provided an opportunity to ask questions and all were answered. The patient agreed with the plan and demonstrated an understanding of the instructions.  Patient advised to call back or seek an in-person evaluation if the symptoms or condition worsens.  Duration of encounter: 30 minutes.  Total time on encounter, as per AMA guidelines included both the face-to-face and non-face-to-face time personally spent by the physician and/or other qualified health care professional(s) on the day of the encounter (includes time in activities that require the physician or other qualified health care professional and does not include time in activities normally performed by clinical staff). Physician's time may include the following activities when performed: Preparing to see the patient (e.g., pre-charting review of records,  searching for previously ordered imaging, lab work, and nerve conduction tests) Review of prior analgesic pharmacotherapies. Reviewing PMP Interpreting ordered tests (e.g., lab work, imaging, nerve conduction tests) Performing post-procedure evaluations, including interpretation of diagnostic procedures Obtaining and/or reviewing separately obtained history Performing a medically appropriate examination and/or evaluation Counseling and educating the patient/family/caregiver Ordering medications, tests, or procedures Referring and communicating with other health care professionals (when not separately reported) Documenting clinical information in the electronic or other health record Independently interpreting results (not separately reported) and communicating results to the patient/ family/caregiver Care coordination (not separately reported)  Note by: Gaspar Cola, MD Date: 10/23/2022; Time: 10:23 AM

## 2022-10-20 ENCOUNTER — Ambulatory Visit: Payer: Self-pay

## 2022-10-20 ENCOUNTER — Ambulatory Visit (INDEPENDENT_AMBULATORY_CARE_PROVIDER_SITE_OTHER): Payer: Medicare Other | Admitting: Family

## 2022-10-20 ENCOUNTER — Encounter: Payer: Self-pay | Admitting: Family

## 2022-10-20 VITALS — BP 124/74 | HR 82 | Temp 98.2°F | Ht 61.0 in | Wt 193.2 lb

## 2022-10-20 DIAGNOSIS — R9389 Abnormal findings on diagnostic imaging of other specified body structures: Secondary | ICD-10-CM

## 2022-10-20 DIAGNOSIS — I6522 Occlusion and stenosis of left carotid artery: Secondary | ICD-10-CM

## 2022-10-20 DIAGNOSIS — R2990 Unspecified symptoms and signs involving the nervous system: Secondary | ICD-10-CM

## 2022-10-20 DIAGNOSIS — I83813 Varicose veins of bilateral lower extremities with pain: Secondary | ICD-10-CM

## 2022-10-20 DIAGNOSIS — L03115 Cellulitis of right lower limb: Secondary | ICD-10-CM

## 2022-10-20 DIAGNOSIS — H66002 Acute suppurative otitis media without spontaneous rupture of ear drum, left ear: Secondary | ICD-10-CM

## 2022-10-20 DIAGNOSIS — R0602 Shortness of breath: Secondary | ICD-10-CM

## 2022-10-20 DIAGNOSIS — R296 Repeated falls: Secondary | ICD-10-CM

## 2022-10-20 DIAGNOSIS — R2681 Unsteadiness on feet: Secondary | ICD-10-CM

## 2022-10-20 DIAGNOSIS — R0609 Other forms of dyspnea: Secondary | ICD-10-CM

## 2022-10-20 DIAGNOSIS — R531 Weakness: Secondary | ICD-10-CM

## 2022-10-20 DIAGNOSIS — Z9181 History of falling: Secondary | ICD-10-CM | POA: Diagnosis not present

## 2022-10-20 DIAGNOSIS — L03116 Cellulitis of left lower limb: Secondary | ICD-10-CM

## 2022-10-20 DIAGNOSIS — R600 Localized edema: Secondary | ICD-10-CM | POA: Insufficient documentation

## 2022-10-20 DIAGNOSIS — J849 Interstitial pulmonary disease, unspecified: Secondary | ICD-10-CM

## 2022-10-20 MED ORDER — FUROSEMIDE 20 MG PO TABS: 20.0000 mg | ORAL_TABLET | Freq: Every day | ORAL | 0 refills | Status: DC

## 2022-10-20 MED ORDER — CEPHALEXIN 500 MG PO CAPS: 500.0000 mg | ORAL_CAPSULE | Freq: Three times a day (TID) | ORAL | 0 refills | Status: AC

## 2022-10-20 NOTE — Progress Notes (Signed)
Established Patient Hospital follow up Visit Subjective:   Patient ID: Carmen [name], female    DOB: 11/20/1940  Age: 82 y.o. MRN: [id number]  CC:  Chief Complaint  Patient presents with   Follow-up    Mini Stroke        HPI  Carmen [name] is a 82 y.o. female presenting on 10/20/2022 for  hospital follow up.   Accompanied by daughter.   Hospital: [facility]  Admit: 10/14/22 Discharge:10/16/22 Discharge BH:[id number], left sided weakness, fall, traumatic injury of head, right elbow pain Discharge medications: eliquis 5 mg bid   Went to ER via EMS when daughter found her facility at home unresponsive.  EEG monitored  CT head without acute abn CTA without evidence of LVO , could not go forward with thrombectomy  EKG aflutter, RVH, rate 98 on admission  MRI head w/o , no acute infarction. 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 Preceding CTA.  CXR: cardiolmegaly, stable. Mild bil interstitial prominence suspected ILD CTA head and neck, negative for large vessel occlusion. Pulmonary septal thicken ground glass opacity in upper lungs  CT head w/o: stable, no acute findings.   Has appt with cardiology with Dr. Lalla Brothers  F/u with Dr. Gilda Crease with vascular surgeon   Lab Results  Component Value Date   WBC 7.6 10/14/2022   HGB 11.9 (L) 10/14/2022   HCT 35.0 (L) 10/14/2022   MCV 95.3 10/14/2022   PLT 225 10/14/2022  .lastcmp  Anemia stable.   Acute concerns:  Since discharge does c/o left ear pain.   In hospital short discussion with thoughts on inpatient rehab, but they could not get a room for her during the admission due to lack of staffing,and so daughter states they checked out early. Right sided leg and arm weakness, often unstable. Repeat falls in the last six months. Seizure vs stroke. Would benefit from physical therapy.   ILD: does  have sob and needs to sit upright at night time. Often with DOE while walkinga.    No Known Allergies ----------------   Social history:  Relevant past medical, surgical, family and social history reviewed and updated as indicated. Interim medical history since our last visit reviewed.  Allergies and medications reviewed and updated.  DATA REVIEWED: CHART IN EPIC    ROS: Negative unless specifically indicated above in HPI.    Current Outpatient Medications:    aspirin EC 81 MG tablet, Take 81 mg by mouth daily. Swallow whole., Disp: , Rfl:    atorvastatin (LIPITOR) 40 MG tablet, Take 1 tablet (40 mg total) by mouth at bedtime., Disp: 90 tablet, Rfl: 0   CALCIUM PO, Take 1 tablet by mouth daily., Disp: , Rfl:    cephALEXin (KEFLEX) 500 MG capsule, Take 1 capsule (500 mg total) by mouth 3 (three) times daily for 7 days., Disp: 21 capsule, Rfl: 0   divalproex (DEPAKOTE) 500 MG DR tablet, Take 1 tablet (500 mg total) by mouth at bedtime., Disp: 90 tablet, Rfl: 0   ELIQUIS 5 MG TABS tablet, NEW PRESCRIPTION REQUEST: Eliquis 5 Mg TAKE ONE TABLET BY MOUTH TWICE DAILY (Patient taking differently: Take 5 mg by mouth 2 (two) times daily.), Disp: 180 tablet, Rfl: 3   furosemide (LASIX) 20 MG tablet, Take 1 tablet (20 mg total) by mouth daily for 10 days., Disp: 10 tablet, Rfl: 0   levothyroxine (SYNTHROID) 137 MCG  tablet, Take 137 mcg by mouth daily before breakfast., Disp: , Rfl:    magnesium oxide (MAG-OX) 400 (240 Mg) MG tablet, Take 400 mg by mouth daily., Disp: , Rfl:    metoprolol succinate (TOPROL-XL) 100 MG 24 hr tablet, TAKE 1/2 TABLET BY MOUTH AT BEDTIME, Disp: 45 tablet, Rfl: 0   Multiple Vitamin (MULTIVITAMIN) tablet, Take 1 tablet by mouth daily., Disp: , Rfl:    traZODone (DESYREL) 100 MG tablet, TAKE 1 TABLET BY MOUTH AT BEDTIME AS NEEDED FOR SLEEP, Disp: 90 tablet, Rfl: 0   HYDROcodone-acetaminophen (NORCO/VICODIN) 5-325 MG tablet, Take 1 tablet by mouth 2 (two) times daily as  needed for severe pain. Must last 30 days., Disp: 60 tablet, Rfl: 0      Objective:    BP 124/74   Pulse 82   Temp 98.2 F (36.8 C) (Oral)   Ht 5\' 1"  (1.549 m)   Wt 193 lb 3.2 oz (87.6 kg)   SpO2 98%   BMI 36.50 kg/m   Wt Readings from Last 3 Encounters:  10/20/22 193 lb 3.2 oz (87.6 kg)  09/21/22 188 lb 12.8 oz (85.6 kg)  08/22/22 187 lb (84.8 kg)    Physical Exam Constitutional:      General: She is not in acute distress.    Appearance: Normal appearance. She is obese. She is not ill-appearing, toxic-appearing or diaphoretic.  HENT:     Head: Normocephalic.     Right Ear: Ear canal normal.     Left Ear: Ear canal normal. A middle ear effusion is present. Tympanic membrane is erythematous and bulging.  Cardiovascular:     Rate and Rhythm: Normal rate.     Comments: Slight redness to bil le  Pulmonary:     Effort: Pulmonary effort is normal.  Musculoskeletal:        General: Normal range of motion.     Right lower leg: 3+ Edema present.     Left lower leg: 3+ Edema present.  Skin:    Comments: Ecchymosis on left lateral side of anterior hand, below left eye on bridge of chee, left mid extremity .   Neurological:     General: No focal deficit present.     Mental Status: She is alert and oriented to person, place, and time. Mental status is at baseline.  Psychiatric:        Mood and Affect: Mood normal.        Behavior: Behavior normal.        Thought Content: Thought content normal.        Judgment: Judgment normal.         Assessment & Plan:  Stroke-like symptoms vs. seizure like activity  Assessment & Plan: Advised patient to be sure to make follow-up with neurology in regards to this.  Orders: -     Ambulatory referral to Physical Therapy  Unsteady gait when walking -     Ambulatory referral to Physical Therapy  Varicose veins of both lower extremities with pain  At risk for falls -     Ambulatory referral to Physical Therapy  Repeated falls -      Ambulatory referral to Physical Therapy  Right sided weakness Assessment & Plan: With frequent falls with injury to self as a result as well as unstable gait patient sent to physical therapy for eval treat.  Orders: -     Ambulatory referral to Physical Therapy  Abnormal chest x-ray -     Ambulatory referral to  Pulmonology  ILD (interstitial lung disease) ([place]) Assessment & Plan: Recently new finding on CTA and chest x-ray. Referral placed for pulmonary for evaluation and treat as worsening dyspnea as well as shortness of breath.  Orders: -     Ambulatory referral to Pulmonology  DOE (dyspnea on exertion) -     Ambulatory referral to Pulmonology  SOB (shortness of breath) on exertion -     Ambulatory referral to Pulmonology  Non-recurrent acute suppurative otitis media of left ear without spontaneous rupture of tympanic membrane Assessment & Plan: Rx cephalexin 500 mg 3 times daily x 7 days Will also help cellulitis as well.  Orders: -     Cephalexin; Take 1 capsule (500 mg total) by mouth 3 (three) times daily for 7 days.  Dispense: 21 capsule; Refill: 0  Bilateral cellulitis of lower leg -     Cephalexin; Take 1 capsule (500 mg total) by mouth 3 (three) times daily for 7 days.  Dispense: 21 capsule; Refill: 0  Pedal edema Assessment & Plan: Advised patient to wear daily compression stockings as well as elevate legs at nighttime. Rx furosemide 20 mg once daily Advised patient to follow-up in 1 week so we can repeat potassium and see how swelling is looking.  Orders: -     Furosemide; Take 1 tablet (20 mg total) by mouth daily for 10 days.  Dispense: 10 tablet; Refill: 0  ICAO (internal carotid artery occlusion), left Assessment & Plan: Continue follow-up with vascular as scheduled Remain on Eliquis 5 mg twice daily   Multiple falls Assessment & Plan: Multiple falls recently especially within the last 6 months resulting in injury as a result. Had a long  discussion with daughter as well as patient discussing likely need for assisted living at this point.  I do not feel it is safe for her to be home alone without additional help.  They will consider this but they seem to be very resistant      Return in about 1 week (around 10/27/2022) for f/u lower leg edema.  Eugenia Pancoast, FNP

## 2022-10-20 NOTE — Assessment & Plan Note (Signed)
Advised patient to be sure to make follow-up with neurology in regards to this.

## 2022-10-20 NOTE — Assessment & Plan Note (Signed)
Rx cephalexin 500 mg 3 times daily x 7 days Will also help cellulitis as well.

## 2022-10-20 NOTE — Assessment & Plan Note (Signed)
With frequent falls with injury to self as a result as well as unstable gait patient sent to physical therapy for eval treat.

## 2022-10-20 NOTE — Patient Instructions (Addendum)
  Start furosemide 20 mg daily for the next one week.  We will repeat potassium in one week  Work on elevating legs, compression stockings.   A referral was placed today for physical therapy.  Please let us know if you have not heard back within 2 weeks about the referral.  A referral was placed today for psychology. Please let us know if you have not heard back within 2 weeks about the referral.   Regards,   Crystallynn Noorani FNP-C

## 2022-10-20 NOTE — Assessment & Plan Note (Signed)
Recently new finding on CTA and chest x-ray. Referral placed for pulmonary for evaluation and treat as worsening dyspnea as well as shortness of breath.

## 2022-10-20 NOTE — Assessment & Plan Note (Signed)
Multiple falls recently especially within the last 6 months resulting in injury as a result. Had a long discussion with daughter as well as patient discussing likely need for assisted living at this point.  I do not feel it is safe for her to be home alone without additional help.  They will consider this but they seem to be very resistant

## 2022-10-20 NOTE — Patient Outreach (Signed)
  Care Coordination   Follow Up Visit Note   10/20/2022 Name: Carmen [name] MRN: [id number] DOB: March 20, 1941  Carmen [name] is a 82 y.o. year old female who sees [place], [place], [place] for primary care. I  daughter, Carmen [name].  What matters to the patients health and wellness today?  Ongoing management of health conditions and follow up with providers.     Goals Addressed             This Visit's Progress    Patient Stated:  Management of right leg pain       Care Coordination Interventions: Evaluation of current treatment plan related to right leg pain and patient's adherence to plan as established by provider.  Daughter states patient had a recent episode where she found patient in her home unconscious.  She states patient was seen in the ED on 10/14/22.  Daughter states test done did not show anything but thinks patient had a " light stroke."  Daughter states patient was seen by her primary care provider this morning. She states patient is being referred for outpatient physical therapy due to ongoing weakness and deconditioning. Daughter denies patient having any new symptoms. She states patient will be following up with a pulmonologist due to her needing oxygen while in the emergency room. She states there are some concerns regarding patients breathing.   Reviewed medications with daughter and discussed compliance.  Daughter states  patient was prescribed a fluid pill.  Reviewed scheduled/upcoming provider appointments. Daughter states patient has a follow up appointments scheduled  with a new cardiologist, a pulmonologist.  She states patient was also referred to a rheumatologist but she has not heard from the office for scheduling. She states she does not know the name of rheumatologist.  Provided daughter name and contact phone number of referred rheumatologist. Dr. Ephriam Jenkins [phone] Assessed for new / ongoing symptoms.  Advised daughter to call  provider for mild/ moderate  symptoms and call 911 for severe symptoms.             SDOH assessments and interventions completed:  No     Care Coordination Interventions:  Yes, provided   Follow up plan: Follow up call scheduled for 11/16/22    Encounter Outcome:  Pt. Visit Completed   Quinn Plowman RN,BSN,CCM [place] [phone] direct line

## 2022-10-20 NOTE — Assessment & Plan Note (Signed)
Continue follow-up with vascular as scheduled Remain on Eliquis 5 mg twice daily

## 2022-10-20 NOTE — Assessment & Plan Note (Signed)
Advised patient to wear daily compression stockings as well as elevate legs at nighttime. Rx furosemide 20 mg once daily Advised patient to follow-up in 1 week so we can repeat potassium and see how swelling is looking.

## 2022-10-23 ENCOUNTER — Encounter: Payer: Self-pay | Admitting: Pain Medicine

## 2022-10-23 ENCOUNTER — Ambulatory Visit: Payer: Medicare Other | Attending: Pain Medicine | Admitting: Pain Medicine

## 2022-10-23 VITALS — BP 117/69 | HR 76 | Temp 97.1°F | Ht 60.0 in | Wt 193.0 lb

## 2022-10-23 DIAGNOSIS — M542 Cervicalgia: Secondary | ICD-10-CM | POA: Diagnosis not present

## 2022-10-23 DIAGNOSIS — M545 Low back pain, unspecified: Secondary | ICD-10-CM | POA: Diagnosis not present

## 2022-10-23 DIAGNOSIS — M47816 Spondylosis without myelopathy or radiculopathy, lumbar region: Secondary | ICD-10-CM | POA: Diagnosis not present

## 2022-10-23 DIAGNOSIS — M79604 Pain in right leg: Secondary | ICD-10-CM | POA: Diagnosis not present

## 2022-10-23 DIAGNOSIS — Z79891 Long term (current) use of opiate analgesic: Secondary | ICD-10-CM | POA: Diagnosis not present

## 2022-10-23 DIAGNOSIS — Z79899 Other long term (current) drug therapy: Secondary | ICD-10-CM | POA: Insufficient documentation

## 2022-10-23 DIAGNOSIS — G894 Chronic pain syndrome: Secondary | ICD-10-CM | POA: Diagnosis not present

## 2022-10-23 DIAGNOSIS — M79605 Pain in left leg: Secondary | ICD-10-CM | POA: Insufficient documentation

## 2022-10-23 DIAGNOSIS — G8929 Other chronic pain: Secondary | ICD-10-CM | POA: Insufficient documentation

## 2022-10-23 MED ORDER — HYDROCODONE-ACETAMINOPHEN 5-325 MG PO TABS: 1.0000 | ORAL_TABLET | Freq: Two times a day (BID) | ORAL | 0 refills | Status: DC | PRN

## 2022-10-23 MED ORDER — NALOXONE HCL 4 MG/0.1ML NA LIQD: 1.0000 | NASAL | 0 refills | Status: DC | PRN

## 2022-10-23 NOTE — Patient Instructions (Signed)
____________________________________________________________________________________________  Patient Information update  To: All of our patients.  Re: Name change.  It has been made official that our current name, "[facility]"   will soon be changed to "[facility]".   The purpose of this change is to eliminate any confusion created by the concept of our practice being a "Medication Management Pain Clinic". In the past this has led to the misconception that we treat pain primarily by the use of prescription medications.  Nothing can be farther from the truth.   Understanding PAIN MANAGEMENT: To further understand what our practice does, you first have to understand that "Pain Management" is a subspecialty that requires additional training once a physician has completed their specialty training, which can be in either Anesthesia, Neurology, Psychiatry, or Physical Medicine and Rehabilitation (PMR). Each one of these contributes to the final approach taken by each physician to the management of their patient's pain. To be a "Pain Management Specialist" you must have first completed one of the specialty trainings below.  Anesthesiologists - trained in clinical pharmacology and interventional techniques such as nerve blockade and regional as well as central neuroanatomy. They are trained to block pain before, during, and after surgical interventions.  Neurologists - trained in the diagnosis and pharmacological treatment of complex neurological conditions, such as Multiple Sclerosis, Parkinson's, spinal cord injuries, and other systemic conditions that may be associated with symptoms that may include but are not limited to pain. They tend to rely primarily on the treatment of chronic pain using prescription medications.  Psychiatrist - trained in conditions affecting the psychosocial  wellbeing of patients including but not limited to depression, anxiety, schizophrenia, personality disorders, addiction, and other substance use disorders that may be associated with chronic pain. They tend to rely primarily on the treatment of chronic pain using prescription medications.   Physical Medicine and Rehabilitation (PMR) physicians, also known as physiatrists - trained to treat a wide variety of medical conditions affecting the brain, spinal cord, nerves, bones, joints, ligaments, muscles, and tendons. Their training is primarily aimed at treating patients that have suffered injuries that have caused severe physical impairment. Their training is primarily aimed at the physical therapy and rehabilitation of those patients. They may also work alongside orthopedic surgeons or neurosurgeons using their expertise in assisting surgical patients to recover after their surgeries.  INTERVENTIONAL PAIN MANAGEMENT is sub-subspecialty of Pain Management.  Our physicians are Board-certified in Anesthesia, Pain Management, and Interventional Pain Management.  This meaning that not only have they been trained and Board-certified in their specialty of Anesthesia, and subspecialty of Pain Management, but they have also received further training in the sub-subspecialty of Interventional Pain Management, in order to become Board-certified as INTERVENTIONAL PAIN MANAGEMENT SPECIALIST.    Mission: Our goal is to use our skills in  INTERVENTIONAL PAIN MANAGEMENT as alternatives to the chronic use of prescription opioid medications for the treatment of pain. To make this more clear, we have changed our name to reflect what we do and offer. We will continue to offer medication management assessment and recommendations, but we will not be taking over any patient's medication management.  ____________________________________________________________________________________________      ____________________________________________________________________________________________  Opioid Pain Medication Update  To: All patients taking opioid pain medications. (I.e.: hydrocodone, hydromorphone, oxycodone, oxymorphone, morphine, codeine, methadone, tapentadol, tramadol, buprenorphine, fentanyl, etc.)  Re: Review of side effects and adverse reactions of opioid analgesics, as well as new information   about long term effects of this class of medications.  Direct risks of long-term opioid therapy are not limited to opioid addiction and overdose. Potential medical risks include serious fractures, breathing problems during sleep, hyperalgesia, immunosuppression, chronic constipation, bowel obstruction, myocardial infarction, and tooth decay secondary to xerostomia.  Historically, the original case for using long-term opioid therapy to treat chronic noncancer pain was based on safety assumptions that subsequent experience has called into question. In 1996, the American Pain Society and the American Academy of Pain Medicine issued a consensus statement supporting long-term opioid therapy. This statement acknowledged the dangers of opioid prescribing but concluded that the risk for addiction was low; respiratory depression induced by opioids was short-lived, occurred mainly in opioid-naive patients, and was antagonized by pain; tolerance was not a common problem; and efforts to control diversion should not constrain opioid prescribing. This has now proven to be wrong. Experience regarding the risks for opioid addiction, misuse, and overdose in community practice has failed to support these assumptions.  According to the Centers for Disease Control and Prevention, fatal overdoses involving opioid analgesics have increased sharply over the past decade. Currently, more than 96,700 people die from drug overdoses every year. Opioids are a factor in 7 out of every 10 overdose deaths. Deaths from drug  overdose have surpassed motor vehicle accidents as the leading cause of death for individuals between the ages of 35 and 54.  Clinical data suggest that neuroendocrine dysfunction may be very common in both men and women, potentially causing hypogonadism, erectile dysfunction, infertility, decreased libido, osteoporosis, and depression. Recent studies linked higher opioid dose to increased opioid-related mortality. Controlled observational studies reported that long-term opioid therapy may be associated with increased risk for cardiovascular events. Subsequent meta-analysis concluded that the safety of long-term opioid therapy in elderly patients has not been proven.   Side Effects and adverse reactions: Common side effects: Drowsiness (sedation). Dizziness. Nausea and vomiting. Constipation. Physical dependence -- Dependence often manifests with withdrawal symptoms when opioids are discontinued or decreased. Tolerance -- As you take repeated doses of opioids, you require increased medication to experience the same effect of pain relief. Respiratory depression -- This can occur in healthy people, especially with higher doses. However, people with COPD, asthma or other lung conditions may be even more susceptible to fatal respiratory impairment.  Uncommon side effects: An increased sensitivity to feeling pain and extreme response to pain (hyperalgesia). Chronic use of opioids can lead to this. Delayed gastric emptying (the process by which the contents of your stomach are moved into your small intestine). Muscle rigidity. Immune system and hormonal dysfunction. Quick, involuntary muscle jerks (myoclonus). Arrhythmia. Itchy skin (pruritus). Dry mouth (xerostomia).  Long-term side effects: Chronic constipation. Sleep-disordered breathing (SDB). Increased risk of bone fractures. Hypothalamic-pituitary-adrenal dysregulation. Increased risk of overdose.  RISKS: Fractures and Falls:   Opioids increase the risk and incidence of falls. This is of particular importance in elderly patients.  Endocrine System:  Long-term administration is associated with endocrine abnormalities. Influences on both the hypothalamic-pituitary-adrenal axis?and the hypothalamic-pituitary-gonadal axis have been demonstrated with consequent hypogonadism and adrenal insufficiency in both sexes. Hypogonadism and decreased levels of dehydroepiandrosterone sulfate have been reported in men and women. Endocrine effects can lead to: Amenorrhoea in women Reduced libido in both sexes Erectile dysfunction in men Infertility Depression and fatigue Patients (particularly women of childbearing age) should avoid opioids. There is insufficient evidence to recommend routine monitoring of asymptomatic patients taking opioids in the long-term for hormonal deficiencies.  Immune System: Human   studies have demonstrated that opioids have an immunomodulating effect. These effects are mediated via opioid receptors both on immune effector cells and in the central nervous system. Opioids have been demonstrated to have adverse effects on antimicrobial response and anti-tumour surveillance. Buprenorphine has been demonstrated to have no impact on immune function.  Opioid Induced Hyperalgesia: Human studies have demonstrated that prolonged use of opioids can lead to a state of abnormal pain sensitivity, sometimes called opioid induced hyperalgesia (OIH). Opioid induced hyperalgesia is not usually seen in the absence of tolerance to opioid analgesia. Clinically, hyperalgesia may be diagnosed if the patient on long-term opioid therapy presents with increased pain. This might be qualitatively and anatomically distinct from pain related to disease progression or to breakthrough pain resulting from development of opioid tolerance. Pain associated with hyperalgesia tends to be more diffuse than the pre-existing pain and less defined  in quality. Management of opioid induced hyperalgesia requires opioid dose reduction.  Cancer: Chronic opioid therapy has been associated with an increased risk of cancer among noncancer patients with chronic pain. This association was more evident in chronic strong opioid users. Chronic opioid consumption causes significant pathological changes in the small intestine and colon. Epidemiological studies have found that there is a link between opium dependence and initiation of gastrointestinal cancers. Cancer is the second leading cause of death after cardiovascular disease. Chronic use of opioids can cause multiple conditions such as GERD, immunosuppression and renal damage as well as carcinogenic effects, which are associated with the incidence of cancers.   Mortality: Long-term opioid use has been associated with increased mortality among patients with chronic non-cancer pain (CNCP).  Prescription of long-acting opioids for chronic noncancer pain was associated with a significantly increased risk of all-cause mortality, including deaths from causes other than overdose.  Reference: Von Korff M, Kolodny A, Deyo RA, Chou R. Long-term opioid therapy reconsidered. Ann Intern Med. 2011 Sep 6;155(5):325-8. doi: 10.7326/[id number]. PMID: 21893626; PMCID: PMC3280085. Bedson J, Chen Y, Ashworth J, Hayward RA, Dunn KM, Jordan KP. Risk of adverse events in patients prescribed long-term opioids: A cohort study in the [place] Clinical Practice Research Datalink. Eur J Pain. 2019 May;23(5):908-922. doi: 10.1002/ejp.1357. Epub 2019 Jan 31. PMID: 30620116. Colameco S, Coren JS, Ciervo CA. Continuous opioid treatment for chronic noncancer pain: a time for moderation in prescribing. Postgrad Med. 2009 Jul;121(4):61-6. doi: 10.3810/pgm.2009.07.2032. PMID: 19641271. Chou R, Turner JA, Devine EB, Hansen RN, Sullivan SD, Blazina I, Dana T, Bougatsos C, Deyo RA. The effectiveness and risks of long-term  opioid therapy for chronic pain: a systematic review for a National Institutes of Health Pathways to Prevention Workshop. Ann Intern Med. 2015 Feb 17;162(4):276-86. doi: 10.7326/M14-2559. PMID: 25581257. Warner M, Chen LH, Makuc DM. NCHS Data Brief No. 22. [place]: Centers for Disease Control and Prevention; 2009. Sep, Increase in Fatal Poisonings Involving Opioid Analgesics in the [place], 1999-2006. Song IA, Choi HR, Oh TK. Long-term opioid use and mortality in patients with chronic non-cancer pain: Ten-year follow-up study in [place] from 2010 through 2019. EClinicalMedicine. 2022 Jul 18;51:101558. doi: 10.1016/j.eclinm.2022.101558. PMID: 35875817; PMCID: PMC9304910. Huser, W., Schubert, T., Vogelmann, T. et al. All-cause mortality in patients with long-term opioid therapy compared with non-opioid analgesics for chronic non-cancer pain: a database study. BMC Med 18, 162 (2020). https://doi.org/10.1186/[email or web address] Rashidian H, Zendehdel K, Kamangar F, Malekzadeh R, Haghdoost AA. An Ecological Study of the Association between Opiate Use and Incidence of Cancers. Addict Health. 2016 Fall;8(4):252-260. PMID: 28819556; PMCID: PMC5554805.  Our Goals and Recommendations: Our goal is to control   your pain with means other than the use of opioid pain medications. Talk to your physician about coming off of these medications. We can assist you with the tapering down and stopping these medicines. Based on the information above, even if you cannot completely stop these medicines, even a decrease in the dose has been shown to be associated with a decreased risk.  ____________________________________________________________________________________________     ____________________________________________________________________________________________  National Pain Medication Shortage  The [place] is experiencing worsening drug shortages. These have had a negative widespread effect on patient care  and treatment. Not expected to improve any time soon. Predicted to last past 2029.   Drug shortage list (generic names) Oxycodone IR Oxycodone/APAP Oxymorphone IR Hydromorphone Hydrocodone/APAP Morphine  Where is the problem?  Manufacturing and supply level.  Will this shortage affect you?  Only if you take any of the above pain medications.  How? You may be unable to fill your prescription.  Your pharmacist may offer a "partial fill" of your prescription. (Warning: Do not accept partial fills.) Prescriptions partially filled cannot be transferred to another pharmacy. Read our Medication Rules and Regulation. Depending on how much medicine you are dependent on, you may experience withdrawals when unable to get the medication.  Recommendations: Consider ending your dependence on opioid pain medications. Ask your pain specialist to assist you with the process. Consider switching to a medication currently not in shortage, such as Buprenorphine. Talk to your pain specialist about this option. Consider decreasing your pain medication requirements by managing tolerance thru "Drug Holidays". This may help minimize withdrawals, should you run out of medicine. Control your pain thru the use of non-pharmacological interventional therapies.   Your prescriber: Prescribers cannot be blamed for shortages. Medication manufacturing and supply issues cannot be fixed by the prescriber.   NOTE: The prescriber is not responsible for supplying the medication, or solving supply issues. Work with your pharmacist to solve it. The patient is responsible for the decision to take or continue taking the medication and for identifying and securing a legal supply source. By law, supplying the medication is the job and responsibility of the pharmacy. The prescriber is responsible for the evaluation, monitoring, and prescribing of these medications.   Prescribers will NOT: Re-issue prescriptions that have been  partially filled. Re-issue prescriptions already sent to a pharmacy.  Re-send prescriptions to a different pharmacy because yours did not have your medication. Ask pharmacist to order more medicine or transfer the prescription to another pharmacy. (Read below.)  New 2023 regulation: "June 02, 2022 Revised Regulation Allows DEA-Registered Pharmacies to Transfer Electronic Prescriptions at a Patient's Request DEA Headquarters Division - Public Information Office Patients now have the ability to request their electronic prescription be transferred to another pharmacy without having to go back to their practitioner to initiate the request. This revised regulation went into effect on Monday, May 29, 2022.     At a patient's request, a DEA-registered retail pharmacy can now transfer an electronic prescription for a controlled substance (schedules II-V) to another DEA-registered retail pharmacy. Prior to this change, patients would have to go through their practitioner to cancel their prescription and have it re-issued to a different pharmacy. The process was taxing and time consuming for both patients and practitioners.    The Drug Enforcement Administration (DEA) published its intent to revise the process for transferring electronic prescriptions on August 20, 2020.  The final rule was published in the federal register on April 27, 2022 and went into effect 30 days later.    Under the final rule, a prescription can only be transferred once between pharmacies, and only if allowed under existing state or other applicable law. The prescription must remain in its electronic form; may not be altered in any way; and the transfer must be communicated directly between two licensed pharmacists. It's important to note, any authorized refills transfer with the original prescription, which means the entire prescription will be filled at the same pharmacy".   Reference: https://www.dea.gov/stories/2023/2023-06/2022-09-01/revised-regulation-allows-dea-registered-pharmacies-transfer (DEA website announcement)  https://www.govinfo.gov/content/pkg/FR-2022-04-27/pdf/2023-15847.pdf (Federal Register  Department of Justice)   Federal Register / Vol. 88, No. 143 / Thursday, April 27, 2022 / Rules and Regulations DEPARTMENT OF JUSTICE  Drug Enforcement Administration  21 CFR Part 1306  [Docket No. [id number]]  RIN 1117-AB64 Transfer of Electronic Prescriptions for Schedules II-V Controlled Substances Between Pharmacies for Initial Filling  ____________________________________________________________________________________________     _______________________________________________________________________  Medication Rules  Purpose: To inform patients, and their family members, of our medication rules and regulations.  Applies to: All patients receiving prescriptions from our practice (written or electronic).  Pharmacy of record: This is the pharmacy where your electronic prescriptions will be sent. Make sure we have the correct one.  Electronic prescriptions: In compliance with the [place] Strengthen Opioid Misuse Prevention (STOP) Act of 2017 (Session Law 2017-74/H243), effective October 02, 2018, all controlled substances must be electronically prescribed. Written prescriptions, faxing, or calling prescriptions to a pharmacy will no longer be done.  Prescription refills: These will be provided only during in-person appointments. No medications will be renewed without a "face-to-face" evaluation with your provider. Applies to all prescriptions.  NOTE: The following applies primarily to controlled substances (Opioid* Pain Medications).   Type of encounter (visit): For patients receiving controlled substances, face-to-face visits are required. (Not an option and not up to the patient.)  Patient's responsibilities: Pain Pills: Bring all  pain pills to every appointment (except for procedure appointments). Pill Bottles: Bring pills in original pharmacy bottle. Bring bottle, even if empty. Always bring the bottle of the most recent fill.  Medication refills: You are responsible for knowing and keeping track of what medications you are taking and when is it that you will need a refill. The day before your appointment: write a list of all prescriptions that need to be refilled. The day of the appointment: give the list to the admitting nurse. Prescriptions will be written only during appointments. No prescriptions will be written on procedure days. If you forget a medication: it will not be "Called in", "Faxed", or "electronically sent". You will need to get another appointment to get these prescribed. No early refills. Do not call asking to have your prescription filled early. Partial  or short prescriptions: Occasionally your pharmacy may not have enough pills to fill your prescription.  NEVER ACCEPT a partial fill or a prescription that is short of the total amount of pills that you were prescribed.  With controlled substances the law allows 72 hours for the pharmacy to complete the prescription.  If the prescription is not completed within 72 hours, the pharmacist will require a new prescription to be written. This means that you will be short on your medicine and we WILL NOT send another prescription to complete your original prescription.  Instead, request the pharmacy to send a carrier to a nearby branch to get enough medication to provide you with your full prescription. Prescription Accuracy: You are responsible for carefully inspecting your prescriptions before leaving our office. Have the discharge nurse carefully go over each prescription   with you, before taking them home. Make sure that your name is accurately spelled, that your address is correct. Check the name and dose of your medication to make sure it is accurate. Check the  number of pills, and the written instructions to make sure they are clear and accurate. Make sure that you are given enough medication to last until your next medication refill appointment. Taking Medication: Take medication as prescribed. When it comes to controlled substances, taking less pills or less frequently than prescribed is permitted and encouraged. Never take more pills than instructed. Never take the medication more frequently than prescribed.  Inform other Doctors: Always inform, all of your healthcare providers, of all the medications you take. Pain Medication from other Providers: You are not allowed to accept any additional pain medication from any other Doctor or Healthcare provider. There are two exceptions to this rule. (see below) In the event that you require additional pain medication, you are responsible for notifying us, as stated below. Cough Medicine: Often these contain an opioid, such as codeine or hydrocodone. Never accept or take cough medicine containing these opioids if you are already taking an opioid* medication. The combination may cause respiratory failure and death. Medication Agreement: You are responsible for carefully reading and following our Medication Agreement. This must be signed before receiving any prescriptions from our practice. Safely store a copy of your signed Agreement. Violations to the Agreement will result in no further prescriptions. (Additional copies of our Medication Agreement are available upon request.) Laws, Rules, & Regulations: All patients are expected to follow all Federal and State Laws, Statutes, Rules, & Regulations. Ignorance of the Laws does not constitute a valid excuse.  Illegal drugs and Controlled Substances: The use of illegal substances (including, but not limited to marijuana and its derivatives) and/or the illegal use of any controlled substances is strictly prohibited. Violation of this rule may result in the immediate and  permanent discontinuation of any and all prescriptions being written by our practice. The use of any illegal substances is prohibited. Adopted CDC guidelines & recommendations: Target dosing levels will be at or below 60 MME/day. Use of benzodiazepines** is not recommended.  Exceptions: There are only two exceptions to the rule of not receiving pain medications from other Healthcare Providers. Exception #1 (Emergencies): In the event of an emergency (i.e.: accident requiring emergency care), you are allowed to receive additional pain medication. However, you are responsible for: As soon as you are able, call our office [phone], at any time of the day or night, and leave a message stating your name, the date and nature of the emergency, and the name and dose of the medication prescribed. In the event that your call is answered by a member of our staff, make sure to document and save the date, time, and the name of the person that took your information.  Exception #2 (Planned Surgery): In the event that you are scheduled by another doctor or dentist to have any type of surgery or procedure, you are allowed (for a period no longer than 30 days), to receive additional pain medication, for the acute post-op pain. However, in this case, you are responsible for picking up a copy of our "Post-op Pain Management for Surgeons" handout, and giving it to your surgeon or dentist. This document is available at our office, and does not require an appointment to obtain it. Simply go to our office during business hours (Monday-Thursday from 8:00 AM to 4:00 PM) (Friday 8:00   AM to 12:00 Noon) or if you have a scheduled appointment with us, prior to your surgery, and ask for it by name. In addition, you are responsible for: calling our office [phone], at any time of the day or night, and leaving a message stating your name, name of your surgeon, type of surgery, and date of procedure or surgery. Failure to comply  with your responsibilities may result in termination of therapy involving the controlled substances. Medication Agreement Violation. Following the above rules, including your responsibilities will help you in avoiding a Medication Agreement Violation ("Breaking your Pain Medication Contract").  Consequences:  Not following the above rules may result in permanent discontinuation of medication prescription therapy.  *Opioid medications include: morphine, codeine, oxycodone, oxymorphone, hydrocodone, hydromorphone, meperidine, tramadol, tapentadol, buprenorphine, fentanyl, methadone. **Benzodiazepine medications include: diazepam (Valium), alprazolam (Xanax), clonazepam (Klonopine), lorazepam (Ativan), clorazepate (Tranxene), chlordiazepoxide (Librium), estazolam (Prosom), oxazepam (Serax), temazepam (Restoril), triazolam (Halcion) (Last updated: 07/25/2022) ______________________________________________________________________    ______________________________________________________________________  Medication Recommendations and Reminders  Applies to: All patients receiving prescriptions (written and/or electronic).  Medication Rules & Regulations: You are responsible for reading, knowing, and following our "Medication Rules" document. These exist for your safety and that of others. They are not flexible and neither are we. Dismissing or ignoring them is an act of "non-compliance" that may result in complete and irreversible termination of such medication therapy. For safety reasons, "non-compliance" will not be tolerated. As with the [place] fundamental legal principle of "ignorance of the law is no defense", we will accept no excuses for not having read and knowing the content of documents provided to you by our practice.  Pharmacy of record:  Definition: This is the pharmacy where your electronic prescriptions will be sent.  We do not endorse any particular pharmacy. It is up to you and your  insurance to decide what pharmacy to use.  We do not restrict you in your choice of pharmacy. However, once we write for your prescriptions, we will NOT be re-sending more prescriptions to fix restricted supply problems created by your pharmacy, or your insurance.  The pharmacy listed in the electronic medical record should be the one where you want electronic prescriptions to be sent. If you choose to change pharmacy, simply notify our nursing staff. Changes will be made only during your regular appointments and not over the phone.  Recommendations: Keep all of your pain medications in a safe place, under lock and key, even if you live alone. We will NOT replace lost, stolen, or damaged medication. We do not accept "Police Reports" as proof of medications having been stolen. After you fill your prescription, take 1 week's worth of pills and put them away in a safe place. You should keep a separate, properly labeled bottle for this purpose. The remainder should be kept in the original bottle. Use this as your primary supply, until it runs out. Once it's gone, then you know that you have 1 week's worth of medicine, and it is time to come in for a prescription refill. If you do this correctly, it is unlikely that you will ever run out of medicine. To make sure that the above recommendation works, it is very important that you make sure your medication refill appointments are scheduled at least 1 week before you run out of medicine. To do this in an effective manner, make sure that you do not leave the office without scheduling your next medication management appointment. Always ask the nursing staff to show you   in your prescription , when your medication will be running out. Then arrange for the receptionist to get you a return appointment, at least 7 days before you run out of medicine. Do not wait until you have 1 or 2 pills left, to come in. This is very poor planning and does not take into consideration  that we may need to cancel appointments due to bad weather, sickness, or emergencies affecting our staff. DO NOT ACCEPT A "Partial Fill": If for any reason your pharmacy does not have enough pills/tablets to completely fill or refill your prescription, do not allow for a "partial fill". The law allows the pharmacy to complete that prescription within 72 hours, without requiring a new prescription. If they do not fill the rest of your prescription within those 72 hours, you will need a separate prescription to fill the remaining amount, which we will NOT provide. If the reason for the partial fill is your insurance, you will need to talk to the pharmacist about payment alternatives for the remaining tablets, but again, DO NOT ACCEPT A PARTIAL FILL, unless you can trust your pharmacist to obtain the remainder of the pills within 72 hours.  Prescription refills and/or changes in medication(s):  Prescription refills, and/or changes in dose or medication, will be conducted only during scheduled medication management appointments. (Applies to both, written and electronic prescriptions.) No refills on procedure days. No medication will be changed or started on procedure days. No changes, adjustments, and/or refills will be conducted on a procedure day. Doing so will interfere with the diagnostic portion of the procedure. No phone refills. No medications will be "called into the pharmacy". No Fax refills. No weekend refills. No Holliday refills. No after hours refills.  Remember:  Business hours are:  Monday to Thursday 8:00 AM to 4:00 PM Provider's Schedule: Leeasia Secrist, MD - Appointments are:  Medication management: Monday and Wednesday 8:00 AM to 4:00 PM Procedure day: Tuesday and Thursday 7:30 AM to 4:00 PM Bilal Lateef, MD - Appointments are:  Medication management: Tuesday and Thursday 8:00 AM to 4:00 PM Procedure day: Monday and Wednesday 7:30 AM to 4:00 PM (Last update:  07/25/2022) ______________________________________________________________________    ____________________________________________________________________________________________  Drug Holidays  What is a "Drug Holiday"? Drug Holiday: is the name given to the process of slowly tapering down and temporarily stopping the pain medication for the purpose of decreasing or eliminating tolerance to the drug.  Benefits Improved effectiveness Decreased required effective dose Improved pain control End dependence on high dose therapy Decrease cost of therapy Uncovering "opioid-induced hyperalgesia". (OIH)  What is "opioid hyperalgesia"? It is a paradoxical increase in pain caused by exposure to opioids. Stopping the opioid pain medication, contrary to the expected, it actually decreases or completely eliminates the pain. Ref.: "A comprehensive review of opioid-induced hyperalgesia". Marion Lee, et.al. Pain Physician. 2011 Mar-Apr;14(2):145-61.  What is tolerance? Tolerance: the progressive loss of effectiveness of a pain medicine due to repetitive use. A common problem of opioid pain medications.  How long should a "Drug Holiday" last? Effectiveness depends on the patient staying off all opioid pain medicines for a minimum of 14 consecutive days. (2 weeks)  How about just taking less of the medicine? Does not work. Will not accomplish goal of eliminating the excess receptors.  How about switching to a different pain medicine? (AKA. "Opioid rotation") Does not work. Creates the illusion of effectiveness by taking advantage of inaccurate equivalent dose calculations between different opioids. -This "technique" was promoted by studies funded   by pharmaceutical companies, such as [employer], creators of "OxyContin".  Can I stop the medicine "cold turkey"? Depends. You should always coordinate with your Pain Specialist to make the transition as smoothly as possible. Avoid stopping the  medicine abruptly without consulting. We recommend a "slow taper".  What is a slow taper? Taper: refers to the gradual decrease in dose.   How do I stop/taper the dose? Slowly. Decrease the daily amount of pills that you take by one (1) pill every seven (7) days. This is called a "slow downward taper". Example: if you normally take four (4) pills per day, drop it to three (3) pills per day for seven (7) days, then to two (2) pills per day for seven (7) days, then to one (1) per day for seven (7) days, and then stop the medicine. The 14 day "Drug Holiday" starts on the first day without medicine.   Will I experience withdrawals? Unlikely with a slow taper.  What triggers withdrawals? Withdrawals are triggered by the sudden/abrupt stop of high dose opioids. Withdrawals can be avoided by slowly decreasing the dose over a prolonged period of time.  What are withdrawals? Symptoms associated with sudden/abrupt reduction/stopping of high-dose, long-term use of pain medication. Withdrawal are seldom seen on low dose therapy, or patients rarely taking opioid medication.  Early Withdrawal Symptoms may include: Agitation Anxiety Muscle aches Increased tearing Insomnia Runny nose Sweating Yawning  Late symptoms may include: Abdominal cramping Diarrhea Dilated pupils Goose bumps Nausea Vomiting  (Last update: 09/10/2022) ____________________________________________________________________________________________    ____________________________________________________________________________________________  WARNING: CBD (cannabidiol) & Delta (Delta-8 tetrahydrocannabinol) products.   Applicable to:  All individuals currently taking or considering taking CBD (cannabidiol) and, more important, all patients taking opioid analgesic controlled substances (pain medication). (Example: oxycodone; oxymorphone; hydrocodone; hydromorphone; morphine; methadone; tramadol; tapentadol; fentanyl;  buprenorphine; butorphanol; dextromethorphan; meperidine; codeine; etc.)  Introduction:  Recently there has been a drive towards the use of "natural" products for the treatment of different conditions, including pain anxiety and sleep disorders. Marijuana and hemp are two varieties of the cannabis genus plants. Marijuana and its derivatives are illegal, while hemp and its derivatives are not. Cannabidiol (CBD) and tetrahydrocannabinol (THC), are two natural compounds found in plants of the Cannabis genus. They can both be extracted from hemp or marijuana. Both compounds interact with your body's endocannabinoid system in very different ways. CBD is associated with pain relief (analgesia) while THC is associated with the psychoactive effects ("the high") obtained from the use of marijuana products. There are two main types of THC: Delta-9, which comes from the marijuana plant and it is illegal, and Delta-8, which comes from the hemp plant, and it is legal. (Both, Delta-9-THC and Delta-8-THC are psychoactive and give you "the high".)   Legality:  Marijuana and its derivatives: illegal Hemp and its derivatives: Legal (State dependent) UPDATE: (11/18/2021) The Drug Enforcement Agency (DEA) issued a letter stating that "delta" cannabinoids, including Delta-8-THCO and Delta-9-THCO, synthetically derived from hemp do not qualify as hemp and will be viewed as Schedule I drugs. (Schedule I drugs, substances, or chemicals are defined as drugs with no currently accepted medical use and a high potential for abuse. Some examples of Schedule I drugs are: heroin, lysergic acid diethylamide (LSD), marijuana (cannabis), 3,4-methylenedioxymethamphetamine (ecstasy), methaqualone, and peyote.) (https://www.dea.gov)  Legal status of CBD in [place]:  "Conditionally Legal"  Reference: "FDA Regulation of Cannabis and Cannabis-Derived Products, Including Cannabidiol (CBD)" -  https://www.fda.gov/news-events/public-health-focus/fda-regulation-cannabis-and-cannabis-derived-products-including-cannabidiol-cbd  Warning:  CBD is not FDA approved and has not   undergo the same manufacturing controls as prescription drugs.  This means that the purity and safety of available CBD may be questionable. Most of the time, despite manufacturer's claims, it is contaminated with THC (delta-9-tetrahydrocannabinol - the chemical in marijuana responsible for the "HIGH").  When this is the case, the THC contaminant will trigger a positive urine drug screen (UDS) test for Marijuana (carboxy-THC).   The FDA recently put out a warning about 5 things that everyone should be aware of regarding Delta-8 THC: Delta-8 THC products have not been evaluated or approved by the FDA for safe use and may be marketed in ways that put the public health at risk. The FDA has received adverse event reports involving delta-8 THC-containing products. Delta-8 THC has psychoactive and intoxicating effects. Delta-8 THC manufacturing often involve use of potentially harmful chemicals to create the concentrations of delta-8 THC claimed in the marketplace. The final delta-8 THC product may have potentially harmful by-products (contaminants) due to the chemicals used in the process. Manufacturing of delta-8 THC products may occur in uncontrolled or unsanitary settings, which may lead to the presence of unsafe contaminants or other potentially harmful substances. Delta-8 THC products should be kept out of the reach of children and pets.  NOTE: Because a positive UDS for any illicit substance is a violation of our medication agreement, your opioid analgesics (pain medicine) may be permanently discontinued.  MORE ABOUT CBD  General Information: CBD was discovered in 1940 and it is a derivative of the cannabis sativa genus plants (Marijuana and Hemp). It is one of the 113 identified substances found in Marijuana. It accounts  for up to 40% of the plant's extract. As of 2018, preliminary clinical studies on CBD included research for the treatment of anxiety, movement disorders, and pain. CBD is available and consumed in multiple forms, including inhalation of smoke or vapor, as an aerosol spray, and by mouth. It may be supplied as an oil containing CBD, capsules, dried cannabis, or as a liquid solution. CBD is thought not to be as psychoactive as THC (delta-9-tetrahydrocannabinol - the chemical in marijuana responsible for the "HIGH"). Studies suggest that CBD may interact with different biological target receptors in the body, including cannabinoid and other neurotransmitter receptors. As of 2018 the mechanism of action for its biological effects has not been determined.  Side-effects  Adverse reactions: Dry mouth, diarrhea, decreased appetite, fatigue, drowsiness, malaise, weakness, sleep disturbances, and others.  Drug interactions:  CBD may interact with medications such as blood-thinners. CBD causes drowsiness on its own and it will increase drowsiness caused by other medications, including antihistamines (such as Benadryl), benzodiazepines (Xanax, Ativan, Valium), antipsychotics, antidepressants, opioids, alcohol and supplements such as kava, melatonin and St. John's Wort.  Other drug interactions: Brivaracetam (Briviact); Caffeine; Carbamazepine (Tegretol); Citalopram (Celexa); Clobazam (Onfi); Eslicarbazepine (Aptiom); Everolimus (Zostress); Lithium; Methadone (Dolophine); Rufinamide (Banzel); Sedative medications (CNS depressants); Sirolimus (Rapamune); Stiripentol (Diacomit); Tacrolimus (Prograf); Tamoxifen ; Soltamox); Topiramate (Topamax); Valproate; Warfarin (Coumadin); Zonisamide. (Last update: 09/11/2022) ____________________________________________________________________________________________   ____________________________________________________________________________________________  Naloxone Nasal  Spray  Why am I receiving this medication? [place] STOP ACT requires that all patients taking high dose opioids or at risk of opioids respiratory depression, be prescribed an opioid reversal agent, such as Naloxone (AKA: Narcan).  What is this medication? NALOXONE (nal OX one) treats opioid overdose, which causes slow or shallow breathing, severe drowsiness, or trouble staying awake. Call emergency services after using this medication. You may need additional treatment. Naloxone works by reversing the effects of opioids.   It belongs to a group of medications called opioid blockers.  COMMON BRAND NAME(S): Kloxxado, Narcan  What should I tell my care team before I take this medication? They need to know if you have any of these conditions: Heart disease Substance use disorder An unusual or allergic reaction to naloxone, other medications, foods, dyes, or preservatives Pregnant or trying to get pregnant Breast-feeding  When to use this medication? This medication is to be used for the treatment of respiratory depression (less than 8 breaths per minute) secondary to opioid overdose.   How to use this medication? This medication is for use in the nose. Lay the person on their back. Support their neck with your hand and allow the head to tilt back before giving the medication. The nasal spray should be given into 1 nostril. After giving the medication, move the person onto their side. Do not remove or test the nasal spray until ready to use. Get emergency medical help right away after giving the first dose of this medication, even if the person wakes up. You should be familiar with how to recognize the signs and symptoms of a narcotic overdose. If more doses are needed, give the additional dose in the other nostril. Talk to your care team about the use of this medication in children. While this medication may be prescribed for children as young as newborns for selected conditions, precautions  do apply.  Naloxone Overdosage: If you think you have taken too much of this medicine contact a poison control center or emergency room at once.  NOTE: This medicine is only for you. Do not share this medicine with others.  What if I miss a dose? This does not apply.  What may interact with this medication? This is only used during an emergency. No interactions are expected during emergency use. This list may not describe all possible interactions. Give your health care provider a list of all the medicines, herbs, non-prescription drugs, or dietary supplements you use. Also tell them if you smoke, drink alcohol, or use illegal drugs. Some items may interact with your medicine.  What should I watch for while using this medication? Keep this medication ready for use in the case of an opioid overdose. Make sure that you have the phone number of your care team and local hospital ready. You may need to have additional doses of this medication. Each nasal spray contains a single dose. Some emergencies may require additional doses. After use, bring the treated person to the nearest hospital or call 911. Make sure the treating care team knows that the person has received a dose of this medication. You will receive additional instructions on what to do during and after use of this medication before an emergency occurs.  What side effects may I notice from receiving this medication? Side effects that you should report to your care team as soon as possible: Allergic reactions--skin rash, itching, hives, swelling of the face, lips, tongue, or throat Side effects that usually do not require medical attention (report these to your care team if they continue or are bothersome): Constipation Dryness or irritation inside the nose Headache Increase in blood pressure Muscle spasms Stuffy nose Toothache This list may not describe all possible side effects. Call your doctor for medical advice about side  effects. You may report side effects to FDA at 1-800-FDA-1088.  Where should I keep my medication? Because this is an emergency medication, you should keep it with you at all times.  Keep   out of the reach of children and pets. Store between 20 and 25 degrees C (68 and 77 degrees F). Do not freeze. Throw away any unused medication after the expiration date. Keep in original box until ready to use.  NOTE: This sheet is a summary. It may not cover all possible information. If you have questions about this medicine, talk to your doctor, pharmacist, or health care provider.   2023 Elsevier/Gold Standard (2021-05-27 00:00:00)  ____________________________________________________________________________________________   

## 2022-10-23 NOTE — Progress Notes (Signed)
Nursing Pain Medication Assessment:  Safety precautions to be maintained throughout the outpatient stay will include: orient to surroundings, keep bed in low position, maintain call bell within reach at all times, provide assistance with transfer out of bed and ambulation.  Medication Inspection Compliance: Carmen [name] did not comply with our request to bring her pills to be counted. She was reminded that bringing the medication bottles, even when empty, is a requirement.  Medication: None brought in. Pill/Patch Count: None available to be counted. Bottle Appearance: No container available. Did not bring bottle(s) to appointment. Filled Date: N/A Last Medication intake:  Today Safety precautions to be maintained throughout the outpatient stay will include: orient to surroundings, keep bed in low position, maintain call bell within reach at all times, provide assistance with transfer out of bed and ambulation.   Patient states she has 10 remaining pills, Put in pill box and brought that today

## 2022-10-23 NOTE — Progress Notes (Signed)
Nursing Pain Medication Assessment:  Safety precautions to be maintained throughout the outpatient stay will include: orient to surroundings, keep bed in low position, maintain call bell within reach at all times, provide assistance with transfer out of bed and ambulation.  Medication Inspection Compliance: Pill count conducted under aseptic conditions, in front of the patient. Neither the pills nor the bottle was removed from the patient's sight at any time. Once count was completed pills were immediately returned to the patient in their original bottle.  Medication: Hydrocodone/APAP Pill/Patch Count:  14 of 60 pills remain Pill/Patch Appearance: Markings consistent with prescribed medication Bottle Appearance: Standard pharmacy container. Clearly labeled. Filled Date: 65 / 30 / 2023 Last Medication intake:  Today

## 2022-10-24 NOTE — Progress Notes (Signed)
Cardiology Office Note    Date:  10/27/2022   ID:  Carmen [name], Carmen [name] 21-Feb-1941, MRN [id number]  PCP:  Mort Sawyers, FNP  Cardiologist:  Yvonne Kendall, MD  Electrophysiologist:  Lanier Prude, MD   Chief Complaint: Follow-up  History of Present Illness:   Carmen [name] is a 82 y.o. female with history of CAD status post CABG in 2005 with LIMA to LAD, sequential SVG to diagonal and OM, and SVG to PDA with subsequent PCI due to failure of multiple grafts (Taxus stents to proximal LAD, mid LCx, and RCA), permanent A-fib, CVA, carotid artery disease, PAD, HTN, HLD, hypothyroidism, anemia, chronic pain syndrome, frequent falls, and anxiety/depression who presents for hospital follow-up.  She moved to [facility] from [place] to be closer to family.  She previously underwent 4-vessel CABG in 2005 with LIMA to LAD, sequential SVG to diagonal and OM, and SVG to PDA. A year later, there was failure of multiple grafts and she underwent PCI/DES (Taxus stents) to the proximal LAD, mid LCx, and RCA x 2 in 2006. These details were provided by cards related to her cardiac interventions by her daughter. Prior to establishing with Dr. Okey Dupre, it was noted she had not been followed by a cardiologist for many years, but was seeing her PCP in [place], [place] regularly.   It was noted she had been on Eliquis in the setting of Afib/flutter and was prescribed 5 mg bid, though her daughter, a pharmacist in [place], decreased the patient's dosage to 2.5 mg, as she felt this dose was more appropriate for her mother.   Prior to moving to [place], it was noted she had been extremely weak and had innumerable falls for at least a year. In this setting, she was hospitalized in 10/2020, due to encephalopathy, falls, and failure to thrive. Imaging showed evidence of prior infarcts. She was subsequently seen by her PCP in the office in 11/2020, and was noted to be in Afib. She was advised to go to the ED for further evaluation in the  setting of innumerable falls and increasing dizziness in the setting of Afib. The patient declined.   At her visit to establish care with our office on 11/24/2020, she noted some exertional dyspnea and chest pain that she attributed to her prior falls that had improved. She complained of intermittent lightheadedness and cramping of the left calf, particularly at night. She reported having been previously been prescribed cilostazol. It was noted she had difficulty staying well hydrated and was regularly given Pedialyte by her daughter. Without this, she reported frequent nausea. She was noted to be in rate-controlled Afib, which had been present for at least a few months, and possibly longer. It was noted, she was still on subtherapeutic dosed Eliquis and this was increased to 5 mg bid.   She underwent echo on 12/14/2020 that showed an EF of 55-60%, no RWMA, moderate LVH, normal RVSF and ventricular size, normal PASP, mildly dilated left atrium, and mild mitral regurgitation. Lower extremity ABIs were normal bilaterally.  Following this, PCP has discontinued gemfibrozil and cilostazol.   She was admitted to the hospital in 05/2022 with altered mental status/unresponsiveness episode concerning for TIA.  She also reported a fall where she struck her forehead and had a hematoma.  Carotid artery ultrasound during that admission showed right ICA stenosis of 1 to 39% with evidence consistent with a total occlusion of the left ICA, bilateral vertebral arteries with antegrade flow, and normal flow hemodynamics of the bilateral  subclavian arteries.  Echo showed an EF of 70 to 75%, mild concentric LVH, normal RV systolic function and ventricular cavity size, mildly dilated left atrium, and no significant valvular abnormalities.  MRI of the brain was without acute abnormalities.  She was discharged on Depakote at the recommendation of neurology.  She declined cardiac monitor recommended by neurology.  She presented to the ED  a month later after sustaining a fall while using her bedside commode with associated periorbital hematoma.  She was seen by Dr. Okey Dupre in 09/2022 and was without symptoms of angina or decompensation.  She reported ongoing falls.  She was admitted on 10/14/2022 with altered mental status and weakness.  She was found on the ground unresponsive by her daughter.  When EMS arrived she was minimally responsive and had agonal respirations.  CT of the head did not reveal any acute abnormalities.  CTA of the head and neck was negative for emergent large vessel occlusion with a known chronically occluded left ICA as well as a stable chronic intracranial atherosclerosis which was widespread in the anterior circulation as well as notable stable chronic stenoses of the left ACA and right MCA origins.  MRI of the brain showed no acute findings with extensive chronic small vessel ischemia with a chronic right ICA branch and left superior cerebellar infarcts.  High-sensitivity troponin was negative.  EEG was suggestive of cortical dysfunction arising from the right hemisphere felt to likely secondary to underlying structural abnormality, postictal state.  No evidence of seizures or elliptic form discharges during the recording.  No loss of bowel or bladder function.  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  She left AMA.  She was evaluated by EP earlier this week and was very interested in avoiding long-term anticoagulation given her frequent falls.  She was felt to be an acceptable candidate for Watchman procedure with workup for anatomy candidacy pending.  She comes in accompanied by her daughter today and is without symptoms of angina or decompensation.  She has been started on furosemide for lower extremity swelling with noted improvement in swelling.  No further falls since she was last seen earlier this week.  No further syncopal episodes.  PCP has referred the patient to pulmonology with an appointment later today for abnormal  chest x-ray concerning for ILD.  The patient's weight is down 2 pounds today when compared to her visit 2 days prior.   Labs independently reviewed: 10/2022 - potassium 4.5, BUN 15, serum creatinine 0.9, albumin 3.8, AST/ALT normal, Hgb 11.4, PLT 225 09/2022 - TSH normal 06/2022 - magnesium 1.6 05/2022 - TC 156, TG 125, HDL 48, LDL 83, A1c 6.3  Past Medical History:  Diagnosis Date   Abnormal CT scan, lumbar spine (05/11/2021) 05/17/2021   (05/11/2021) LUMBAR CT FINDINGS: Alignment: Lumbar levocurvature, apex L4. Mild lateral listhesis L4 on L5 of approximately 3 mm. Vertebrae: Remote appearing superior endplate deformities at T12 with 10% height loss and L1 with up to 20% height loss. Multilevel discogenic and facet degenerative changes. Mild bilateral SI joint arthrosis.  DISC LEVELS: T11-T12: Near complete disc height loss with de   Abnormal MRI, cervical spine (05/12/2021) 05/17/2021   (05/12/2021) CERVICAL MRI FINDINGS: Motion artifact is present. Posterior Fossa, vertebral arteries, paraspinal tissues: Left superior cerebellar infarct.   DISC LEVELS: C2-C3: Disc bulge with endplate osteophytes. Uncovertebral and facet hypertrophy. C3-C4: Disc bulge with endplate osteophytes. Uncovertebral and facet hypertrophy. Mild canal stenosis. Marked foraminal stenosis. C4-C5: Disc bulge w   Allergy  Anemia    Anxiety    Arthritis    Back pain    Coronary artery disease    Depression    History of stroke 10/02/2020   Hypertension    Peripheral vascular disease (HCC)    Personal history of nicotine dependence 10/02/2020   Prsnl hx of TIA (TIA), and cereb infrc w/o resid deficits 10/02/2020   Stroke [facility])    Thyroid disease     Past Surgical History:  Procedure Laterality Date   back injections     CARDIAC CATHETERIZATION     CATARACT EXTRACTION     CORONARY ANGIOPLASTY     CORONARY ARTERY BYPASS GRAFT     2005    Current Medications: Current Meds  Medication Sig   aspirin EC 81 MG tablet Take  81 mg by mouth daily. Swallow whole.   atorvastatin (LIPITOR) 40 MG tablet Take 1 tablet (40 mg total) by mouth at bedtime.   CALCIUM PO Take 1 tablet by mouth daily.   cephALEXin (KEFLEX) 500 MG capsule Take 1 capsule (500 mg total) by mouth 3 (three) times daily for 7 days.   Cholecalciferol (VITAMIN D-3) 25 MCG (1000 UT) CAPS Take by mouth.   cyanocobalamin (VITAMIN B12) 500 MCG tablet Take 500 mcg by mouth daily.   divalproex (DEPAKOTE) 500 MG DR tablet Take 1 tablet (500 mg total) by mouth at bedtime.   ELIQUIS 5 MG TABS tablet NEW PRESCRIPTION REQUEST: Eliquis 5 Mg TAKE ONE TABLET BY MOUTH TWICE DAILY (Patient taking differently: Take 5 mg by mouth 2 (two) times daily.)   furosemide (LASIX) 20 MG tablet Take 1 tablet (20 mg total) by mouth daily as needed.   HYDROcodone-acetaminophen (NORCO/VICODIN) 5-325 MG tablet Take 1 tablet by mouth 2 (two) times daily as needed for severe pain. Must last 30 days.   levothyroxine (SYNTHROID) 137 MCG tablet Take 137 mcg by mouth daily before breakfast.   magnesium oxide (MAG-OX) 400 (240 Mg) MG tablet Take 400 mg by mouth daily.   metoprolol succinate (TOPROL-XL) 100 MG 24 hr tablet TAKE 1/2 TABLET BY MOUTH AT BEDTIME   Multiple Vitamin (MULTIVITAMIN) tablet Take 1 tablet by mouth daily.   naloxone (NARCAN) nasal spray 4 mg/0.1 mL Place 1 spray into the nose as needed for up to 365 doses (for opioid-induced respiratory depresssion). In case of emergency (overdose), spray once into each nostril. If no response within 3 minutes, repeat application and call 911.   traZODone (DESYREL) 100 MG tablet TAKE 1 TABLET BY MOUTH AT BEDTIME AS NEEDED FOR SLEEP   [DISCONTINUED] furosemide (LASIX) 20 MG tablet Take 1 tablet (20 mg total) by mouth daily for 10 days.    Allergies:   Patient has no known allergies.   Social History   Socioeconomic History   Marital status: Widowed    Spouse name: Not on file   Number of children: 3   Years of education: Not on  file   Highest education level: Not on file  Occupational History   Not on file  Tobacco Use   Smoking status: Former    Years: 30.00    Types: Cigarettes    Quit date: 05/2020    Years since quitting: 2.4   Smokeless tobacco: Never   Tobacco comments:    Smoked about 1 pack per month  Vaping Use   Vaping Use: Never used  Substance and Sexual Activity   Alcohol use: Not Currently   Drug use: Yes    Types:  Hydrocodone   Sexual activity: Not Currently  Other Topics Concern   Not on file  Social History Narrative   ** Merged History Encounter **       Social Determinants of Health   Financial Resource Strain: Low Risk  (08/14/2022)   Overall Financial Resource Strain (CARDIA)    Difficulty of Paying Living Expenses: Not hard at all  Food Insecurity: No Food Insecurity (08/14/2022)   Hunger Vital Sign    Worried About Running Out of Food in the Last Year: Never true    Ran Out of Food in the Last Year: Never true  Transportation Needs: No Transportation Needs (08/14/2022)   PRAPARE - Administrator, Civil Service (Medical): No    Lack of Transportation (Non-Medical): No  Physical Activity: Insufficiently Active (08/14/2022)   Exercise Vital Sign    Days of Exercise per Week: 7 days    Minutes of Exercise per Session: 20 min  Stress: No Stress Concern Present (08/14/2022)   [employer] of Occupational Health - Occupational Stress Questionnaire    Feeling of Stress : Not at all  Social Connections: Not on file     Family History:  The patient's family history includes Bipolar disorder in her daughter; Breast cancer in her mother; Heart attack in her father. There is no history of Diabetes Mellitus II.  ROS:   12-point review of systems is negative unless otherwise noted in the HPI.   EKGs/Labs/Other Studies Reviewed:    Studies reviewed were summarized above. The additional studies were reviewed today:  2D echo 12/14/2020: 1. Left ventricular  ejection fraction, by estimation, is 55 to 60%. The  left ventricle has normal function. The left ventricle has no regional  wall motion abnormalities. There is moderate left ventricular hypertrophy.  Left ventricular diastolic  parameters are indeterminate.   2. Right ventricular systolic function is normal. The right ventricular  size is normal. There is normal pulmonary artery systolic pressure. The  estimated right ventricular systolic pressure is 22.9 mmHg.   3. Left atrial size was mildly dilated.   4. The mitral valve is normal in structure. Mild mitral valve  Regurgitation. __________   Bilateral lower extremity ABI 12/14/2020: Summary:  Right: Resting right ankle-brachial index is within normal range. No  evidence of significant right lower extremity arterial disease. The right  toe-brachial index is normal.   Left: Resting left ankle-brachial index is within normal range. No  evidence of significant left lower extremity arterial disease. The left  toe-brachial index is normal. __________  2D echo 05/09/2022: 1. Left ventricular ejection fraction, by estimation, is 70 to 75%. The  left ventricle has hyperdynamic function. Left ventricular endocardial  border not optimally defined to evaluate regional wall motion. There is  mild concentric left ventricular  hypertrophy. Left ventricular diastolic function could not be evaluated.   2. Right ventricular systolic function is normal. The right ventricular  size is normal. Tricuspid regurgitation signal is inadequate for assessing  PA pressure.   3. Left atrial size was mildly dilated.   4. The mitral valve is normal in structure. No evidence of mitral valve  regurgitation. No evidence of mitral stenosis.   5. The aortic valve is tricuspid. Aortic valve regurgitation is not  visualized. No aortic stenosis is present.  __________  Carotid artery ultrasound 05/09/2022: Summary:  Right Carotid: Velocities in the right ICA are  consistent with a 1-39% stenosis.   Left Carotid: Evidence consistent with a total occlusion of  the left ICA.   Vertebrals:  Bilateral vertebral arteries demonstrate antegrade flow.  Subclavians: Normal flow hemodynamics were seen in bilateral subclavian arteries.     EKG:  EKG is ordered today.  The EKG ordered today demonstrates A-fib, 87 bpm, stable nonspecific ST-T changes  Recent Labs: 04/14/2022: Pro B Natriuretic peptide (BNP) 430.0 06/22/2022: Magnesium 1.6 09/28/2022: TSH 3.93 10/14/2022: ALT 26; BUN 18; Creatinine, Ser 0.90; Hemoglobin 11.9; Platelets 225; Potassium 4.5; Sodium 137 10/27/2022: B Natriuretic Peptide 448.4  Recent Lipid Panel    Component Value Date/Time   CHOL 156 05/09/2022 0559   TRIG 125 05/09/2022 0559   HDL 48 05/09/2022 0559   CHOLHDL 3.3 05/09/2022 0559   VLDL 25 05/09/2022 0559   LDLCALC 83 05/09/2022 0559   LDLDIRECT 99.0 07/26/2021 1432    PHYSICAL EXAM:    VS:  BP 112/72 (BP Location: Left Arm, Patient Position: Sitting, Cuff Size: Normal)   Pulse 87   Ht 5' (1.524 m)   Wt 187 lb (84.8 kg)   SpO2 96%   BMI 36.52 kg/m   BMI: Body mass index is 36.52 kg/m.  Physical Exam Constitutional:      Appearance: She is well-developed.  HENT:     Head: Normocephalic and atraumatic.  Eyes:     General:        Right eye: No discharge.        Left eye: No discharge.  Neck:     Vascular: No JVD.  Cardiovascular:     Rate and Rhythm: Normal rate. Rhythm irregularly irregular.     Heart sounds: Normal heart sounds, S1 normal and S2 normal. Heart sounds not distant. No midsystolic click and no opening snap. No murmur heard.    No friction rub.  Pulmonary:     Effort: Pulmonary effort is normal. No respiratory distress.     Breath sounds: Normal breath sounds. No decreased breath sounds, wheezing or rales.     Comments: Mildly diminished and faintly coarse breath sounds bilaterally. Chest:     Chest wall: No tenderness.  Abdominal:      General: There is no distension.     Palpations: Abdomen is soft.     Tenderness: There is no abdominal tenderness.  Musculoskeletal:     Cervical back: Normal range of motion.     Comments: Minimal bilateral pretibial edema.  Skin:    General: Skin is warm and dry.     Nails: There is no clubbing.  Neurological:     Mental Status: She is alert and oriented to person, place, and time.  Psychiatric:        Speech: Speech normal.        Behavior: Behavior normal.        Thought Content: Thought content normal.        Judgment: Judgment normal.     Wt Readings from Last 3 Encounters:  10/27/22 187 lb 3.2 oz (84.9 kg)  10/27/22 187 lb (84.8 kg)  10/25/22 189 lb (85.7 kg)     ASSESSMENT & PLAN:   CAD status post CABG status post PCI: No symptoms concerning for angina or decompensation.  High-sensitivity troponin negative.  Continue aggressive risk factor modification and secondary prevention including aspirin + apixaban given history of Taxus stents along with metoprolol succinate.  Permanent A-fib: Ventricular rates well-controlled with Toprol-XL.  CHA2DS2-VASc at least 6.  She remains on apixaban 5 reduced dosing criteria.  Given history of frequent falls, she was evaluated by EP earlier  this week with plans to pursue Watchman implantation pending coronary anatomy evaluation.  Questionable syncope with history of frequent falls: Previously declined outpatient cardiac monitoring, now agreeable to wear Zio patch.  Pending findings on external cardiac monitoring, may need to consider ILR implantation by EP.  For the time being, she remains on metoprolol succinate 50 mg twice daily and apixaban given permanent A-fib.  Obtain echo to evaluate for any new structural abnormalities.  HTN: Blood pressure is well-controlled in the office today.  Changes in medical therapy.  HLD: LDL 83 in 05/2022.  She remains on atorvastatin.  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   Weight stable.  She has an appointment with pulmonology later today.  Lower extremity swelling: Improved following initiation of low-dose furosemide, which we will continue on an as needed basis.  With permanent A-fib, likely some degree of diastolic dysfunction contributing.  BNP obtained at outside office today stable.   Disposition: F/u with Dr. Saunders Revel or an APP in 3 months, and EP as directed.   Medication Adjustments/Labs and Tests Ordered: Current medicines are reviewed at length with the patient today.  Concerns regarding medicines are outlined above. Medication changes, Labs and Tests ordered today are summarized above and listed in the Patient Instructions accessible in Encounters.   Signed, Christell Faith, PA-C 10/27/2022 3:31 PM     [facility] [address] [place] [address] [place], [place] [address] [phone]

## 2022-10-24 NOTE — Progress Notes (Unsigned)
Electrophysiology Office Note:    Date:  10/25/2022   ID:  Carmen [name], DOB [birth year], MRN [id number]  PCP:  Mort Sawyers, FNP  [facility] Cardiologist:  Yvonne Kendall, MD  [facility] Electrophysiologist:  Lanier Prude, MD   Referring MD: Yvonne Kendall, MD   Chief Complaint: Atrial fibrillation  History of Present Illness:    Carmen [name] is a 82 y.o. female who presents for an evaluation of atrial fibrillation at the request of Dr. Okey Dupre. Their medical history includes coronary artery disease post CABG in 2005 with subsequent PCI to failed grafts, persistent atrial fibrillation, stroke, peripheral arterial disease, hypertension, hyperlipidemia, hypothyroidism, anemia, chronic pain.  The patient last saw Dr. Okey Dupre on September 21, 2022.  She was admitted in August 2023 with a altered mental status/unresponsiveness episode concerning for TIA. She also reports a fall where she struck her forehead and had a hematoma. She is with her daughter today in clinic.  She is very interested in avoiding long-term exposure anticoagulation given her frequent falls.     Past Medical History:  Diagnosis Date   Abnormal CT scan, lumbar spine (05/11/2021) 05/17/2021   (05/11/2021) LUMBAR CT FINDINGS: Alignment: Lumbar levocurvature, apex L4. Mild lateral listhesis L4 on L5 of approximately 3 mm. Vertebrae: Remote appearing superior endplate deformities at T12 with 10% height loss and L1 with up to 20% height loss. Multilevel discogenic and facet degenerative changes. Mild bilateral SI joint arthrosis.  DISC LEVELS: T11-T12: Near complete disc height loss with de   Abnormal MRI, cervical spine (05/12/2021) 05/17/2021   (05/12/2021) CERVICAL MRI FINDINGS: Motion artifact is present. Posterior Fossa, vertebral arteries, paraspinal tissues: Left superior cerebellar infarct.   DISC LEVELS: C2-C3: Disc bulge with endplate osteophytes. Uncovertebral and facet hypertrophy. C3-C4: Disc bulge with endplate  osteophytes. Uncovertebral and facet hypertrophy. Mild canal stenosis. Marked foraminal stenosis. C4-C5: Disc bulge w   Allergy    Anemia    Anxiety    Arthritis    Back pain    Coronary artery disease    Depression    History of stroke 10/02/2020   Hypertension    Peripheral vascular disease (HCC)    Personal history of nicotine dependence 10/02/2020   Prsnl hx of TIA (TIA), and cereb infrc w/o resid deficits 10/02/2020   Stroke [facility])    Thyroid disease     Past Surgical History:  Procedure Laterality Date   back injections     CARDIAC CATHETERIZATION     CATARACT EXTRACTION     CORONARY ANGIOPLASTY     CORONARY ARTERY BYPASS GRAFT     2005    Current Medications: Current Meds  Medication Sig   aspirin EC 81 MG tablet Take 81 mg by mouth daily. Swallow whole.   atorvastatin (LIPITOR) 40 MG tablet Take 1 tablet (40 mg total) by mouth at bedtime.   CALCIUM PO Take 1 tablet by mouth daily.   cephALEXin (KEFLEX) 500 MG capsule Take 1 capsule (500 mg total) by mouth 3 (three) times daily for 7 days.   Cholecalciferol (VITAMIN D-3) 25 MCG (1000 UT) CAPS Take by mouth.   cyanocobalamin (VITAMIN B12) 500 MCG tablet Take 500 mcg by mouth daily.   divalproex (DEPAKOTE) 500 MG DR tablet Take 1 tablet (500 mg total) by mouth at bedtime.   ELIQUIS 5 MG TABS tablet NEW PRESCRIPTION REQUEST: Eliquis 5 Mg TAKE ONE TABLET BY MOUTH TWICE DAILY (Patient taking differently: Take 5 mg by mouth 2 (two) times daily.)  furosemide (LASIX) 20 MG tablet Take 1 tablet (20 mg total) by mouth daily for 10 days.   HYDROcodone-acetaminophen (NORCO/VICODIN) 5-325 MG tablet Take 1 tablet by mouth 2 (two) times daily as needed for severe pain. Must last 30 days.   [START ON 11/22/2022] HYDROcodone-acetaminophen (NORCO/VICODIN) 5-325 MG tablet Take 1 tablet by mouth 2 (two) times daily as needed for severe pain. Must last 30 days.   [START ON 12/22/2022] HYDROcodone-acetaminophen (NORCO/VICODIN) 5-325 MG tablet Take  1 tablet by mouth 2 (two) times daily as needed for severe pain. Must last 30 days.   levothyroxine (SYNTHROID) 137 MCG tablet Take 137 mcg by mouth daily before breakfast.   magnesium oxide (MAG-OX) 400 (240 Mg) MG tablet Take 400 mg by mouth daily.   metoprolol succinate (TOPROL-XL) 100 MG 24 hr tablet TAKE 1/2 TABLET BY MOUTH AT BEDTIME   Multiple Vitamin (MULTIVITAMIN) tablet Take 1 tablet by mouth daily.   naloxone (NARCAN) nasal spray 4 mg/0.1 mL Place 1 spray into the nose as needed for up to 365 doses (for opioid-induced respiratory depresssion). In case of emergency (overdose), spray once into each nostril. If no response within 3 minutes, repeat application and call [phone].   traZODone (DESYREL) 100 MG tablet TAKE 1 TABLET BY MOUTH AT BEDTIME AS NEEDED FOR SLEEP     Allergies:   Patient has no known allergies.   Social History   Socioeconomic History   Marital status: Widowed    Spouse name: Not on file   Number of children: 3   Years of education: Not on file   Highest education level: Not on file  Occupational History   Not on file  Tobacco Use   Smoking status: Former    Years: 30.00    Types: Cigarettes    Quit date: 05/2020    Years since quitting: 2.4   Smokeless tobacco: Never   Tobacco comments:    Smoked about 1 pack per month  Vaping Use   Vaping Use: Never used  Substance and Sexual Activity   Alcohol use: Not Currently   Drug use: Yes    Types: Hydrocodone   Sexual activity: Not Currently  Other Topics Concern   Not on file  Social History Narrative   ** Merged History Encounter **       Social Determinants of Health   Financial Resource Strain: Low Risk  (08/14/2022)   Overall Financial Resource Strain (CARDIA)    Difficulty of Paying Living Expenses: Not hard at all  Food Insecurity: No Food Insecurity (08/14/2022)   Hunger Vital Sign    Worried About Running Out of Food in the Last Year: Never true    [place] in the Last Year: Never  true  Transportation Needs: No Transportation Needs (08/14/2022)   PRAPARE - Hydrologist (Medical): No    Lack of Transportation (Non-Medical): No  Physical Activity: Insufficiently Active (08/14/2022)   Exercise Vital Sign    Days of Exercise per Week: 7 days    Minutes of Exercise per Session: 20 min  Stress: No Stress Concern Present (08/14/2022)   [place]    Feeling of Stress : Not at all  Social Connections: Not on file     Family History: The patient's family history includes Bipolar disorder in her daughter; Breast cancer in her mother; Heart attack in her father. There is no history of Diabetes Mellitus II.  ROS:  Please see the history of present illness.    All other systems reviewed and are negative.  EKGs/Labs/Other Studies Reviewed:    The following studies were reviewed today:  August 2023 echo shows EF 70%, RV normal, dilated LA    Recent Labs: 04/14/2022: Pro B Natriuretic peptide (BNP) 430.0 06/22/2022: Magnesium 1.6 09/28/2022: TSH 3.93 10/14/2022: ALT 26; BUN 18; Creatinine, Ser 0.90; Hemoglobin 11.9; Platelets 225; Potassium 4.5; Sodium 137  Recent Lipid Panel    Component Value Date/Time   CHOL 156 05/09/2022 0559   TRIG 125 05/09/2022 0559   HDL 48 05/09/2022 0559   CHOLHDL 3.3 05/09/2022 0559   VLDL 25 05/09/2022 0559   LDLCALC 83 05/09/2022 0559   LDLDIRECT 99.0 07/26/2021 1432    Physical Exam:    VS:  BP 130/88   Pulse 88   Ht 5' (1.524 m)   Wt 189 lb (85.7 kg)   SpO2 99%   BMI 36.91 kg/m     Wt Readings from Last 3 Encounters:  10/25/22 189 lb (85.7 kg)  10/23/22 193 lb (87.5 kg)  10/20/22 193 lb 3.2 oz (87.6 kg)     GEN:  Well nourished, well developed in no acute distress.  Elderly CARDIAC: Irregularly irregular, no murmurs, rubs, gallops PSYCHIATRIC:  Normal affect       ASSESSMENT:    1. Permanent atrial fibrillation  (HCC)   2. Essential hypertension    PLAN:    In order of problems listed above:  #Permanent atrial fibrillation #Falls  Presents today to discuss alternatives to anticoagulation given history of frequent falls with personal injury.  I have seen Porfiria Hanser in the office today who is being considered for a Watchman left atrial appendage closure device. I believe they will benefit from this procedure given their history of atrial fibrillation, CHA2DS2-VASc score of 7 and unadjusted ischemic stroke rate of 11.2% per year. Unfortunately, the patient is not felt to be a long term anticoagulation candidate secondary to frequent falls with personal injury. The patient's chart has been reviewed and I feel that they would be a candidate for short term oral anticoagulation after Watchman implant.   It is my belief that after undergoing a LAA closure procedure, Suvi Jerrett will not need long term anticoagulation which eliminates anticoagulation side effects and major bleeding risk.   Procedural risks for the Watchman implant have been reviewed with the patient including a 0.5% risk of stroke, <1% risk of perforation and <1% risk of device embolization. Other risks include bleeding, vascular damage, tamponade, worsening renal function, and death. The patient understands these risk and wishes to proceed.     The published clinical data on the safety and effectiveness of WATCHMAN include but are not limited to the following: - Holmes DR, Everlene Farrier, Sick P et al. for the PROTECT AF Investigators. Percutaneous closure of the left atrial appendage versus warfarin therapy for prevention of stroke in patients with atrial fibrillation: a randomised non-inferiority trial. Lancet 2009; 374: 534-42. Everlene Farrier, Doshi SK, Isa Rankin D et al. on behalf of the PROTECT AF Investigators. Percutaneous Left Atrial Appendage Closure for Stroke Prophylaxis in Patients With Atrial Fibrillation 2.3-Year Follow-up of the  PROTECT AF (Watchman Left Atrial Appendage System for Embolic Protection in Patients With Atrial Fibrillation) Trial. Circulation 2013; 127:720-729. - Alli O, Doshi S,  Kar S, Reddy VY, Sievert H et al. Quality of Life Assessment in the Randomized PROTECT AF (Percutaneous Closure of the Left Atrial Appendage Versus  Warfarin Therapy for Prevention of Stroke in Patients With Atrial Fibrillation) Trial of Patients at Risk for Stroke With Nonvalvular Atrial Fibrillation. J Am Coll Cardiol 2013; 99:2426-8. Vertell Limber DR, Tarri Abernethy, Price M, [place], Sievert H, Doshi S, Huber K, Reddy V. Prospective randomized evaluation of the Watchman left atrial appendage Device in patients with atrial fibrillation versus long-term warfarin therapy; the PREVAIL trial. Journal of the [employer] of Cardiology, Vol. 4, No. 1, 2014, 1-11. - Kar S, Doshi SK, Sadhu A, Horton R, Osorio J et al. Primary outcome evaluation of a next-generation left atrial appendage closure device: results from the PINNACLE FLX trial. Circulation 2021;143(18)1754-1762.    After today's visit with the patient which was dedicated solely for shared decision making visit regarding LAA closure device, the patient decided to proceed with the LAA appendage closure procedure scheduled to be done in the near future at [facility]. Prior to the procedure, I would like to obtain a gated CT scan of the chest with contrast timed for PV/LA visualization.     HAS-BLED score 4 Hypertension Yes  Abnormal renal and liver function (Dialysis, transplant, Cr >2.26 mg/dL /Cirrhosis or Bilirubin >2x Normal or AST/ALT/AP >3x Normal) No  Stroke Yes  Bleeding No  Labile INR (Unstable/high INR) No  Elderly (>65) Yes  Drugs or alcohol (? 8 drinks/week, anti-plt or NSAID) Yes   CHA2DS2-VASc Score = 7  The patient's score is based upon: CHF History: 0 HTN History: 1 Diabetes History: 0 Stroke History: 2 Vascular Disease History: 1 Age Score: 2 Gender  Score: 1      Medication Adjustments/Labs and Tests Ordered: Current medicines are reviewed at length with the patient today.  Concerns regarding medicines are outlined above.  Orders Placed This Encounter  Procedures   CT CARDIAC MORPH/PULM VEIN W/CM&W/O CA SCORE   Basic metabolic panel   No orders of the defined types were placed in this encounter.    Signed, Hilton Cork. Quentin Ore, MD, [facility], [facility] 10/25/2022 8:41 AM    Electrophysiology [facility]

## 2022-10-25 ENCOUNTER — Ambulatory Visit: Payer: Medicare Other | Attending: Cardiology | Admitting: Cardiology

## 2022-10-25 ENCOUNTER — Encounter: Payer: Self-pay | Admitting: Cardiology

## 2022-10-25 VITALS — BP 130/88 | HR 88 | Ht 60.0 in | Wt 189.0 lb

## 2022-10-25 DIAGNOSIS — I4821 Permanent atrial fibrillation: Secondary | ICD-10-CM | POA: Diagnosis not present

## 2022-10-25 DIAGNOSIS — I10 Essential (primary) hypertension: Secondary | ICD-10-CM | POA: Diagnosis not present

## 2022-10-25 NOTE — Patient Instructions (Addendum)
Medication Instructions:  Your physician recommends that you continue on your current medications as directed. Please refer to the Current Medication list given to you today.  *If you need a refill on your cardiac medications before your next appointment, please call your pharmacy*  Lab Work: BMET prior to CT scan If you have labs (blood work) drawn today and your tests are completely normal, you will receive your results only by: [facility] (if you have MyChart) OR A paper copy in the mail If you have any lab test that is abnormal or we need to change your treatment, we will call you to review the results.  Testing/Procedures: Your physician has requested that you have cardiac CT. Cardiac computed tomography (CT) is a painless test that uses an x-ray machine to take clear, detailed pictures of your heart. Please follow instruction sheet as given.  Follow-Up: At [facility], you and your health needs are our priority.  As part of our continuing mission to provide you with exceptional heart care, we have created designated Provider Care Teams.  These Care Teams include your primary Cardiologist (physician) and Advanced Practice Providers (APPs -  Physician Assistants and Nurse Practitioners) who all work together to provide you with the care you need, when you need it.  Your next appointment:   You will receive a call from Lenice Llamas in regards to next steps for Watchman procedure. If you have any questions she can be reached at [phone].

## 2022-10-26 DIAGNOSIS — M2550 Pain in unspecified joint: Secondary | ICD-10-CM | POA: Diagnosis not present

## 2022-10-26 DIAGNOSIS — M159 Polyosteoarthritis, unspecified: Secondary | ICD-10-CM | POA: Diagnosis not present

## 2022-10-26 DIAGNOSIS — R768 Other specified abnormal immunological findings in serum: Secondary | ICD-10-CM | POA: Diagnosis not present

## 2022-10-26 DIAGNOSIS — M19042 Primary osteoarthritis, left hand: Secondary | ICD-10-CM | POA: Diagnosis not present

## 2022-10-26 DIAGNOSIS — Z8673 Personal history of transient ischemic attack (TIA), and cerebral infarction without residual deficits: Secondary | ICD-10-CM | POA: Diagnosis not present

## 2022-10-26 DIAGNOSIS — M19041 Primary osteoarthritis, right hand: Secondary | ICD-10-CM | POA: Diagnosis not present

## 2022-10-26 NOTE — Progress Notes (Unsigned)
Carmen [name], female    DOB: 10-24-1940   MRN: [id number]   Brief patient profile:  36  yowf from [place] via [place]  quit smoking age 82 referred to pulmonary clinic in [facility]  10/27/2022 by Georgina Peer NP   for doe        MRI  sinus neg 10/14/22 s/p sinus surgery around age  50   Was walking track until late 2021 then had stroke and limited since then  Jan 2022 permanent afib   History of Present Illness  10/27/2022  Pulmonary/ 1st office eval/ [facility] / [place] baseline wt 170  Chief Complaint  Patient presents with   Consult    SOB with exertion, since last August. No wheezing. Cough with yellow sputum. Previous sinus surgery.  Dyspnea: 100 ft very slow pace Cough: always had cough with pnds  Sleep: flat bed one pillow /  does better in recliner  SABA use: none  W/u for arthitis in progress  No obvious day to day or daytime pattern/variability or assoc e  or mucus plugs or hemoptysis or cp or chest tightness, subjective wheeze or   hb symptoms.     Also denies any obvious fluctuation of symptoms with weather or environmental changes or other aggravating or alleviating factors except as outlined above   No unusual exposure hx or h/o childhood pna/ asthma or knowledge of premature birth.  Current Allergies, Complete Past Medical History, Past Surgical History, Family History, and Social History were reviewed in [employer] record.  ROS  The following are not active complaints unless bolded Hoarseness, sore throat, dysphagia, dental problems, itching, sneezing,  nasal congestion or discharge of excess mucus or purulent secretions, ear ache,   fever, chills, sweats, unintended wt loss or wt gain, classically pleuritic or exertional cp,  orthopnea pnd or arm/hand swelling  or leg swelling, presyncope, palpitations, abdominal pain, anorexia, nausea, vomiting, diarrhea  or change in bowel habits or change in bladder habits, change in  stools or change in urine, dysuria, hematuria,  rash, arthralgias, visual complaints, headache, numbness, weakness or ataxia or problems with walking or coordination,  change in mood or  memory.           Past Medical History:  Diagnosis Date   Abnormal CT scan, lumbar spine (05/11/2021) 05/17/2021   (05/11/2021) LUMBAR CT FINDINGS: Alignment: Lumbar levocurvature, apex L4. Mild lateral listhesis L4 on L5 of approximately 3 mm. Vertebrae: Remote appearing superior endplate deformities at T12 with 10% height loss and L1 with up to 20% height loss. Multilevel discogenic and facet degenerative changes. Mild bilateral SI joint arthrosis.  DISC LEVELS: T11-T12: Near complete disc height loss with de   Abnormal MRI, cervical spine (05/12/2021) 05/17/2021   (05/12/2021) CERVICAL MRI FINDINGS: Motion artifact is present. Posterior Fossa, vertebral arteries, paraspinal tissues: Left superior cerebellar infarct.   DISC LEVELS: C2-C3: Disc bulge with endplate osteophytes. Uncovertebral and facet hypertrophy. C3-C4: Disc bulge with endplate osteophytes. Uncovertebral and facet hypertrophy. Mild canal stenosis. Marked foraminal stenosis. C4-C5: Disc bulge w   Allergy    Anemia    Anxiety    Arthritis    Back pain    Coronary artery disease    Depression    History of stroke 10/02/2020   Hypertension    Peripheral vascular disease ([place])    Personal history of nicotine dependence 10/02/2020   Prsnl hx of TIA (TIA), and cereb infrc w/o resid deficits 10/02/2020   Stroke (  HCC)    Thyroid disease     Outpatient Medications Prior to Visit  Medication Sig Dispense Refill   aspirin EC 81 MG tablet Take 81 mg by mouth daily. Swallow whole.     atorvastatin (LIPITOR) 40 MG tablet Take 1 tablet (40 mg total) by mouth at bedtime. 90 tablet 0   CALCIUM PO Take 1 tablet by mouth daily.     cephALEXin (KEFLEX) 500 MG capsule Take 1 capsule (500 mg total) by mouth 3 (three) times daily for 7 days. 21 capsule 0    Cholecalciferol (VITAMIN D-3) 25 MCG (1000 UT) CAPS Take by mouth.     cyanocobalamin (VITAMIN B12) 500 MCG tablet Take 500 mcg by mouth daily.     divalproex (DEPAKOTE) 500 MG DR tablet Take 1 tablet (500 mg total) by mouth at bedtime. 90 tablet 0   ELIQUIS 5 MG TABS tablet NEW PRESCRIPTION REQUEST: Eliquis 5 Mg TAKE ONE TABLET BY MOUTH TWICE DAILY (Patient taking differently: Take 5 mg by mouth 2 (two) times daily.) 180 tablet 3   furosemide (LASIX) 20 MG tablet Take 1 tablet (20 mg total) by mouth daily as needed. 30 tablet 2   HYDROcodone-acetaminophen (NORCO/VICODIN) 5-325 MG tablet Take 1 tablet by mouth 2 (two) times daily as needed for severe pain. Must last 30 days. 60 tablet 0   levothyroxine (SYNTHROID) 137 MCG tablet Take 137 mcg by mouth daily before breakfast.     magnesium oxide (MAG-OX) 400 (240 Mg) MG tablet Take 400 mg by mouth daily.     metoprolol succinate (TOPROL-XL) 100 MG 24 hr tablet TAKE 1/2 TABLET BY MOUTH AT BEDTIME 45 tablet 0   Multiple Vitamin (MULTIVITAMIN) tablet Take 1 tablet by mouth daily.     naloxone (NARCAN) nasal spray 4 mg/0.1 mL Place 1 spray into the nose as needed for up to 365 doses (for opioid-induced respiratory depresssion). In case of emergency (overdose), spray once into each nostril. If no response within 3 minutes, repeat application and call 911. 1 each 0   traZODone (DESYREL) 100 MG tablet TAKE 1 TABLET BY MOUTH AT BEDTIME AS NEEDED FOR SLEEP 90 tablet 0   [START ON 11/22/2022] HYDROcodone-acetaminophen (NORCO/VICODIN) 5-325 MG tablet Take 1 tablet by mouth 2 (two) times daily as needed for severe pain. Must last 30 days. (Patient not taking: Reported on 10/27/2022) 60 tablet 0   [START ON 12/22/2022] HYDROcodone-acetaminophen (NORCO/VICODIN) 5-325 MG tablet Take 1 tablet by mouth 2 (two) times daily as needed for severe pain. Must last 30 days. (Patient not taking: Reported on 10/27/2022) 60 tablet 0   No facility-administered medications prior to  visit.     Objective:     BP 120/78 (BP Location: Left Arm, Cuff Size: Normal)   Pulse 83   Temp 98.1 F (36.7 C)   Ht 5' (1.524 m)   Wt 187 lb 3.2 oz (84.9 kg)   SpO2 97%   BMI 36.56 kg/m   SpO2: 97 %  W/c bound elderly german female nad      HEENT : Oropharynx  clear     Nasal turbinates nl    NECK :  without  apparent JVD/ palpable Nodes/TM    LUNGS: no acc muscle use,  Nl contour chest which is clear to A and P bilaterally without cough on insp or exp maneuvers   CV:  RRR  no s3 or murmur or increase in P2, and trace pitting both LE's  ABD:  obese soft and  non tender   MS:   ext warm without deformities Or obvious joint restrictions  calf tenderness, cyanosis or clubbing    SKIN: warm and dry without lesions    NEURO:  alert, approp, nl sensorium with  no motor or cerebellar deficits apparent.      I personally reviewed images and agree with radiology impression as follows:  CXR:  portable1/13/24 1. Cardiomegaly. 2. 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  Labs ordered/ reviewed:      Chemistry      Component Value Date/Time   NA 137 10/14/2022 1023   NA 139 01/11/2022 0822   K 4.5 10/14/2022 1023   CL 101 10/14/2022 1023   CO2 24 10/14/2022 1017   BUN 18 10/14/2022 1023   BUN 18 01/11/2022 0822   CREATININE 0.90 10/14/2022 1023      Component Value Date/Time   CALCIUM 8.9 10/14/2022 1017   ALKPHOS 39 10/14/2022 1017   AST 30 10/14/2022 1017   ALT 26 10/14/2022 1017   BILITOT 0.7 10/14/2022 1017   BILITOT 0.3 11/22/2020 0944        Lab Results  Component Value Date   WBC 7.6 10/14/2022   HGB 11.9 (L) 10/14/2022   HCT 35.0 (L) 10/14/2022   MCV 95.3 10/14/2022   PLT 225 10/14/2022       EOS                                                              0.1                                    10/14/22   No results found for: "DDIMER"    Lab  Results  Component Value Date   TSH 3.93 09/28/2022    BNP   10/27/2022  448     Lab Results  Component Value Date   ESRSEDRATE 3 08/01/2022   ESRSEDRATE 4 03/14/2021         Assessment   No problem-specific Assessment & Plan notes found for this encounter.     Christinia Gully, MD 10/27/2022

## 2022-10-27 ENCOUNTER — Ambulatory Visit (INDEPENDENT_AMBULATORY_CARE_PROVIDER_SITE_OTHER): Payer: Medicare Other

## 2022-10-27 ENCOUNTER — Ambulatory Visit: Payer: Medicare Other | Attending: Physician Assistant | Admitting: Physician Assistant

## 2022-10-27 ENCOUNTER — Encounter: Payer: Self-pay | Admitting: Internal Medicine

## 2022-10-27 ENCOUNTER — Ambulatory Visit
Admission: RE | Admit: 2022-10-27 | Discharge: 2022-10-27 | Disposition: A | Discharge status: Home / Self Care | Payer: Medicare Other | Source: Ambulatory Visit | Attending: Internal Medicine | Admitting: Internal Medicine

## 2022-10-27 ENCOUNTER — Ambulatory Visit: Payer: Medicare Other | Admitting: Internal Medicine

## 2022-10-27 ENCOUNTER — Encounter: Payer: Self-pay | Admitting: Physician Assistant

## 2022-10-27 ENCOUNTER — Other Ambulatory Visit
Admission: RE | Admit: 2022-10-27 | Discharge: 2022-10-27 | Disposition: A | Discharge status: Home / Self Care | Payer: Medicare Other | Source: Ambulatory Visit | Attending: Internal Medicine | Admitting: Internal Medicine

## 2022-10-27 VITALS — BP 112/72 | HR 87 | Ht 60.0 in | Wt 187.0 lb

## 2022-10-27 VITALS — BP 120/78 | HR 83 | Temp 98.1°F | Ht 60.0 in | Wt 187.2 lb

## 2022-10-27 DIAGNOSIS — R296 Repeated falls: Secondary | ICD-10-CM | POA: Diagnosis not present

## 2022-10-27 DIAGNOSIS — R55 Syncope and collapse: Secondary | ICD-10-CM | POA: Diagnosis not present

## 2022-10-27 DIAGNOSIS — I25810 Atherosclerosis of coronary artery bypass graft(s) without angina pectoris: Secondary | ICD-10-CM

## 2022-10-27 DIAGNOSIS — I4821 Permanent atrial fibrillation: Secondary | ICD-10-CM

## 2022-10-27 DIAGNOSIS — R0609 Other forms of dyspnea: Secondary | ICD-10-CM | POA: Insufficient documentation

## 2022-10-27 DIAGNOSIS — R600 Localized edema: Secondary | ICD-10-CM

## 2022-10-27 DIAGNOSIS — R9389 Abnormal findings on diagnostic imaging of other specified body structures: Secondary | ICD-10-CM

## 2022-10-27 DIAGNOSIS — R768 Other specified abnormal immunological findings in serum: Secondary | ICD-10-CM | POA: Diagnosis not present

## 2022-10-27 DIAGNOSIS — E785 Hyperlipidemia, unspecified: Secondary | ICD-10-CM

## 2022-10-27 DIAGNOSIS — R0602 Shortness of breath: Secondary | ICD-10-CM | POA: Diagnosis not present

## 2022-10-27 DIAGNOSIS — I10 Essential (primary) hypertension: Secondary | ICD-10-CM

## 2022-10-27 LAB — BRAIN NATRIURETIC PEPTIDE: B Natriuretic Peptide: 448.4 pg/mL — ABNORMAL HIGH (ref 0.0–100.0)

## 2022-10-27 MED ORDER — FUROSEMIDE 20 MG PO TABS: 20.0000 mg | ORAL_TABLET | Freq: Every day | ORAL | 2 refills | Status: DC | PRN

## 2022-10-27 NOTE — Patient Instructions (Signed)
Try Prilosec otc 20mg   Take 30-60 min before first meal of the day and Pepcid ac (famotidine) 20 mg one an hour before bed  until cough is completely gone for at least a week without the need for cough suppression     For drainage / throat tickle try take CHLORPHENIRAMINE  4 mg  ("Allergy Relief" 4mg   at [facility] should be easiest to find in the blue box usually on bottom shelf)  take one every 4 hours as needed - extremely effective and inexpensive over the counter- may cause drowsiness so start with just a dose or two an hour before bedtime and see how you tolerate it before trying in daytime.   GERD (REFLUX)  is an extremely common cause of respiratory symptoms just like yours , many times with no obvious heartburn at all.    It can be treated with medication, but also with lifestyle changes including elevation of the head of your bed (ideally with 6 -8inch blocks under the headboard of your bed),  Smoking cessation, avoidance of late meals, excessive alcohol, and avoid fatty foods, chocolate, peppermint, colas, red wine, and acidic juices such as orange juice.  NO MINT OR MENTHOL PRODUCTS SO NO COUGH DROPS  USE SUGARLESS CANDY INSTEAD (Jolley ranchers or Stover's or Life Savers) or even ice chips will also do - the key is to swallow to prevent all throat clearing. NO OIL BASED VITAMINS - use powdered substitutes.  Avoid fish oil when coughing.   Please remember to go to the lab and x-ray department  for your tests - we will call you with the results when they are available.      Make sure you check your oxygen saturation at your highest level of activity(NOT after you stop)  to be sure it stays over 90% and keep track of it at least once a week, more often if breathing getting worse, and let me know if losing ground. (Collect the dots to connect the dots approach)    Pulmonary follow up is as needed

## 2022-10-27 NOTE — Patient Instructions (Signed)
Medication Instructions:  Take furosemide 20 mg daily as needed for leg swelling  *If you need a refill on your cardiac medications before your next appointment, please call your pharmacy*   Lab Work: None ordered  If you have labs (blood work) drawn today and your tests are completely normal, you will receive your results only by: [place] (if you have MyChart) OR A paper copy in the mail If you have any lab test that is abnormal or we need to change your treatment, we will call you to review the results.   Testing/Procedures: Your physician has requested that you have an echocardiogram. Echocardiography is a painless test that uses sound waves to create images of your heart. It provides your doctor with information about the size and shape of your heart and how well your heart's chambers and valves are working. This procedure takes approximately one hour. There are no restrictions for this procedure. Please do NOT wear cologne, perfume, aftershave, or lotions (deodorant is allowed). Please arrive 15 minutes prior to your appointment time.  Your physician has recommended that you wear a Zio monitor.   This monitor is a medical device that records the heart's electrical activity. Doctors most often use these monitors to diagnose arrhythmias. Arrhythmias are problems with the speed or rhythm of the heartbeat. The monitor is a small device applied to your chest. You can wear one while you do your normal daily activities. While wearing this monitor if you have any symptoms to push the button and record what you felt. Once you have worn this monitor for the period of time provider prescribed (Usually 14 days), you will return the monitor device in the postage paid box. Once it is returned they will download the data collected and provide [place] with a report which the provider will then review and we will call you with those results. Important tips:  Avoid showering during the first 24 hours  of wearing the monitor. Avoid excessive sweating to help maximize wear time. Do not submerge the device, no hot tubs, and no swimming pools. Keep any lotions or oils away from the patch. After 24 hours you may shower with the patch on. Take brief showers with your back facing the shower head.  Do not remove patch once it has been placed because that will interrupt data and decrease adhesive wear time. Push the button when you have any symptoms and write down what you were feeling. Once you have completed wearing your monitor, remove and place into box which has postage paid and place in your outgoing mailbox.  If for some reason you have misplaced your box then call our office and we can provide another box and/or mail it off for you.  Follow-Up: At [facility], you and your health needs are our priority.  As part of our continuing mission to provide you with exceptional heart care, we have created designated Provider Care Teams.  These Care Teams include your primary Cardiologist (physician) and Advanced Practice Providers (APPs -  Physician Assistants and Nurse Practitioners) who all work together to provide you with the care you need, when you need it.  We recommend signing up for the patient portal called "MyChart".  Sign up information is provided on this After Visit Summary.  MyChart is used to connect with patients for Virtual Visits (Telemedicine).  Patients are able to view lab/test results, encounter notes, upcoming appointments, etc.  Non-urgent messages can be sent to your provider as well.  To learn more about what you can do with MyChart, go to [email or web address].    Your next appointment:   3 month(s)  Provider:   You may see Nelva Bush, MD

## 2022-10-30 LAB — IGE: IgE (Immunoglobulin E), Serum: 8 IU/mL (ref 6–495)

## 2022-10-30 NOTE — Assessment & Plan Note (Addendum)
Onset August 2023  -  Echo 05/09/22  hyperdynamic LF/ mild LAE   - could not quantify diastolic dysfunction  - 04/26/2034   Walked on RA  x  2  lap(s) =  approx 350   ft  @ slow pace, stopped due to sob with lowest 02 sats 96%   - 10/27/22    IgE  8  Symptoms are markedly disproportionate to objective findings and not clear to what extent this is actually a pulmonary  problem but pt does appear to have difficult to sort out respiratory symptoms of unknown origin for which  DDX  = almost all start with A and  include Adherence, Ace Inhibitors, Acid Reflux, Active Sinus Disease, Alpha 1 Antitripsin deficiency, Anxiety masquerading as Airways dz,  ABPA,  Allergy(esp in young), Aspiration (esp in elderly), Adverse effects of meds,  Active smoking or Vaping, A bunch of PE's/clot burden (a few small clots can't cause this syndrome unless there is already severe underlying pulm or vascular dz with poor reserve),  Anemia or thyroid disorder, plus two Bs  = Bronchiectasis and Beta blocker use..and one C= CHF    Adherence is always the initial "prime suspect" and is a multilayered concern that requires a "trust but verify" approach in every patient - starting with knowing how to use medications, especially inhalers, correctly, keeping up with refills and understanding the fundamental difference between maintenance and prns vs those medications only taken for a very short course and then stopped and not refilled.   ? Acid (or non-acid) GERD > always difficult to exclude as up to 75% of pts in some series report no assoc GI/ Heartburn symptoms> rec max (24h)  acid suppression and diet restrictions/ reviewed and instructions given in writing.   ? Active sinus/rhinits > try 1st gen H1 blockers per guidelines    ? Adverse drug effects > none of the usual suspects listed  ? Anemia/ thyroid dz > ruled out   ? A bunch of PEs > on eliquis already   ? CHF > LAE on echo and cxr suggestive component of diastolic  dysfunction > cards f/u planned   Rec regular submax ex as tolerated  Make sure you check your oxygen saturation at your highest level of activity(NOT after you stop)  to be sure it stays over 90% and keep track of it at least once a week, more often if breathing getting worse, and let me know if losing ground. (Collect the dots to connect the dots approach)    Pulmonary f/u is as needed   Each maintenance medication was reviewed in detail including emphasizing most importantly the difference between maintenance and prns and under what circumstances the prns are to be triggered using an action plan format where appropriate.  Total time for H and P, chart review, counseling,  directly observing portions of ambulatory 02 saturation study/ and generating customized AVS unique to this office visit / same day charting  > 45 min pt new to me

## 2022-11-01 ENCOUNTER — Telehealth: Payer: Self-pay

## 2022-11-01 ENCOUNTER — Telehealth: Payer: Self-pay | Admitting: Cardiology

## 2022-11-01 NOTE — Telephone Encounter (Signed)
Forms for Watchman completed and faxed back to [employer]

## 2022-11-01 NOTE — Telephone Encounter (Signed)
Shared decision form attached under media tab. Please see.  Kathyrn Drown NP-C Structural Heart Team  Phone: [phone]

## 2022-11-02 NOTE — Progress Notes (Signed)
Urine drug screen obtained due to the patient's altered mental status to ensure that illicit substances were not the cause of her altered mental status.

## 2022-11-06 ENCOUNTER — Telehealth (HOSPITAL_COMMUNITY): Payer: Self-pay | Admitting: *Deleted

## 2022-11-06 NOTE — Telephone Encounter (Signed)
Reaching out to patient to offer assistance regarding upcoming cardiac imaging study; pt's daughter answered and verbalizes understanding of appt date/time, parking situation and where to check in, pre-test NPO status, and verified current allergies; name and call back number provided for further questions should they arise  Gordy Clement RN [place] [facility] [phone] office [phone] cell  Patient's daughter confirm HR is in the 44's. Asked for patient to take an extra 50mg  metoprolol succinate TWO hours prior to her cardiac CT scan.  Patient's daughter is aware of the 8:30am arrival.

## 2022-11-07 ENCOUNTER — Ambulatory Visit (HOSPITAL_COMMUNITY)
Admission: RE | Admit: 2022-11-07 | Discharge: 2022-11-07 | Disposition: A | Discharge status: Home / Self Care | Payer: Medicare Other | Source: Ambulatory Visit | Attending: Cardiology | Admitting: Cardiology

## 2022-11-07 ENCOUNTER — Telehealth: Payer: Self-pay

## 2022-11-07 DIAGNOSIS — I4821 Permanent atrial fibrillation: Secondary | ICD-10-CM

## 2022-11-07 DIAGNOSIS — I10 Essential (primary) hypertension: Secondary | ICD-10-CM | POA: Diagnosis not present

## 2022-11-07 MED ORDER — IOHEXOL 350 MG/ML SOLN
100.0000 mL | Freq: Once | INTRAVENOUS | Status: AC | PRN
  Administered 2022-11-07: 100 mL via INTRAVENOUS

## 2022-11-07 NOTE — Telephone Encounter (Signed)
[employer] review of 11/07/2022 cCT:

## 2022-11-09 ENCOUNTER — Telehealth: Payer: Self-pay | Admitting: *Deleted

## 2022-11-09 NOTE — Telephone Encounter (Signed)
Pt son in law Landscape architect) called for update on progress of finding hearing aids.  RNCM advised that we still have not located them.  Clint requests call from management regarding this issue.  RNCM advised that process is underway and he should be hearing from someone in the coming days.

## 2022-11-14 ENCOUNTER — Ambulatory Visit: Payer: Medicare Other | Admitting: Family

## 2022-11-16 ENCOUNTER — Ambulatory Visit: Payer: Self-pay

## 2022-11-16 ENCOUNTER — Ambulatory Visit (INDEPENDENT_AMBULATORY_CARE_PROVIDER_SITE_OTHER): Payer: Medicare Other | Admitting: Vascular Surgery

## 2022-11-16 NOTE — Patient Outreach (Signed)
  Care Coordination   11/16/2022 Name: Carmen [name] MRN: [id number] DOB: 07-21-1941   Care Coordination Outreach Attempts:  An unsuccessful telephone outreach was attempted for a scheduled appointment today. Call to patients daughter.  Unable to reach. HIPAA compliant voice message left with call back phone number.   Follow Up Plan:  Additional outreach attempts will be made to offer the patient care coordination information and services.   Encounter Outcome:  No Answer   Care Coordination Interventions:  No, not indicated    Quinn Plowman [facility] [place] [phone] direct line

## 2022-11-21 ENCOUNTER — Telehealth: Payer: Self-pay

## 2022-11-21 ENCOUNTER — Other Ambulatory Visit: Payer: Self-pay

## 2022-11-21 DIAGNOSIS — I4821 Permanent atrial fibrillation: Secondary | ICD-10-CM

## 2022-11-21 DIAGNOSIS — R296 Repeated falls: Secondary | ICD-10-CM

## 2022-11-21 NOTE — Telephone Encounter (Signed)
The patient's daughter, Darnelle Catalan (DPR), called back.  She agreed to procedure date of 12/28/2022. Scheduled the patient for pre-procedure visit date of 12/11/2022. [name] was grateful for assistance.

## 2022-11-21 NOTE — Telephone Encounter (Signed)
-----   Message from Vickie Epley, MD sent at 11/11/2022 11:22 PM EST ----- OK to continue with Watchman evaluation.  Lysbeth Galas T. Quentin Ore, MD, [facility], [facility] Cardiac Electrophysiology

## 2022-11-23 ENCOUNTER — Telehealth: Payer: Self-pay | Admitting: *Deleted

## 2022-11-23 NOTE — Progress Notes (Signed)
  Care Coordination Note  11/23/2022 Name: Carmen [name] MRN: [id number] DOB: May 02, 1941  Carmen [name] is a 82 y.o. year old female who is a primary care patient of Eugenia Pancoast, [place] and is actively engaged with the care management team. I reached out to [facility] by phone today to assist with re-scheduling a follow up visit with the RN Case Manager  Follow up plan: Unsuccessful telephone outreach attempt made. A HIPAA compliant phone message was left for the patient providing contact information and requesting a return call.   Julian Hy, [place] Direct Dial: [phone]

## 2022-11-24 ENCOUNTER — Other Ambulatory Visit: Payer: Self-pay | Admitting: Family

## 2022-11-24 DIAGNOSIS — R2990 Unspecified symptoms and signs involving the nervous system: Secondary | ICD-10-CM

## 2022-11-24 NOTE — Telephone Encounter (Signed)
Refill request for divalproex (DEPAKOTE) 500 MG DR tablet  LV- 10/20/22;  LR- 08/28/22 (90tabs/no refills); NV- not scheduled. Ok to send in medication?

## 2022-11-28 ENCOUNTER — Telehealth: Payer: Self-pay | Admitting: *Deleted

## 2022-11-28 NOTE — Telephone Encounter (Signed)
Spoke with patients daughter and she states she has the monitor and just needs to mail it back. She will get that in the mail.

## 2022-11-28 NOTE — Telephone Encounter (Signed)
-----   Message from Carmen Filbert, RN sent at 11/27/2022  3:24 PM EST ----- FYI- per ZIO reports this patient's ZIO ordered on 10/27/22 by Thurmond Butts is still pending return.

## 2022-12-06 NOTE — Progress Notes (Signed)
[facility]                                     Cardiology Office Note:    Date:  12/11/2022   ID:  Carmen [name], DOB September 28, 1941, MRN [id number]  PCP:  Carmen [name], [facility] Cardiologist:  Carmen Bush, MD  [facility] Electrophysiologist:  Carmen Epley, MD   Referring MD: Carmen Pancoast, FNP   Chief Complaint  Patient presents with   Follow-up    Pre LAAO    History of Present Illness:    Carmen [name] is a 82 y.o. female with a hx of CAD s/p CABG in 2005 with LIMA to LAD, sequential SVG to diagonal and OM, and SVG to PDA with subsequent PCI due to failure of multiple grafts (Taxus stents to proximal LAD, mid LCx, and RCA), CVA, PAD, HTN, HLD, hypothyroidism, anemia, frequent falls, and persistent atrial fibrillation who is being seen today prior to [place] closure with Dr. Quentin [name].   Carmen [name] has been followed by Carmen [name] for her cardiology care after moving to [place] to be closer to family. She underwent echo on 12/14/2020 that showed an EF of 55-60%, no RWMA, moderate LVH, normal RVSF and ventricular size, normal PASP, mildly dilated left atrium, and mild mitral regurgitation. Lower extremity ABIs were normal bilaterally.     She was admitted to the hospital in 05/2022 with altered mental status/unresponsiveness episode concerning for TIA.  She also reported a fall where she struck her forehead and had a hematoma. Carotid artery ultrasound during that admission showed right ICA stenosis of 1 to 39% with evidence consistent with a total occlusion of the left ICA, bilateral vertebral arteries with antegrade flow, and normal flow hemodynamics of the bilateral subclavian arteries. Echo showed an EF of 70 to 75%, mild concentric LVH, normal RV systolic function and ventricular cavity size, mildly dilated left atrium, and no significant valvular abnormalities. MRI of the brain was without acute abnormalities. She was discharged on Depakote at the recommendation of  neurology. She declined cardiac monitor recommended by neurology.  She presented to the ED a month later after sustaining a fall while using her bedside commode with associated periorbital hematoma. In follow up with Carmen [name] in12/2023, she continued to have ongoing falls. She was then referred to Dr. Quentin [name]  for [place] closure to avoid long-term anticoagulation given her frequent falls. She was felt to be an acceptable candidate and is scheduled for 12/28/22.   Today she is here with her daughter and reports that she has been feeling well with no specific complaints. She denies chest pain, palpitations, LE edema, orthopnea, SOB, dizziness, bleeding in stool or urine, or syncope. She had another fall about two weeks ago and has periorbital ecchymosis noted. She now lives with her daughter. Falls are felt secondary to poor mobility with feet shuffling. They are awaiting PT to help with this.   Past Medical History:  Diagnosis Date   Abnormal CT scan, lumbar spine (05/11/2021) 05/17/2021   (05/11/2021) LUMBAR CT FINDINGS: Alignment: Lumbar levocurvature, apex L4. Mild lateral listhesis L4 on L5 of approximately 3 mm. Vertebrae: Remote appearing superior endplate deformities at T12 with 10% height loss and L1 with up to 20% height loss. Multilevel discogenic and facet degenerative changes. Mild bilateral SI joint arthrosis.  DISC LEVELS: T11-T12: Near complete disc height loss with de   Abnormal  MRI, cervical spine (05/12/2021) 05/17/2021   (05/12/2021) CERVICAL MRI FINDINGS: Motion artifact is present. Posterior Fossa, vertebral arteries, paraspinal tissues: Left superior cerebellar infarct.   DISC LEVELS: C2-C3: Disc bulge with endplate osteophytes. Uncovertebral and facet hypertrophy. C3-C4: Disc bulge with endplate osteophytes. Uncovertebral and facet hypertrophy. Mild canal stenosis. Marked foraminal stenosis. C4-C5: Disc bulge w   Allergy    Anemia    Anxiety    Arthritis    Back pain    Coronary artery  disease    Depression    History of stroke 10/02/2020   Hypertension    Peripheral vascular disease ([place])    Personal history of nicotine dependence 10/02/2020   Prsnl hx of TIA (TIA), and cereb infrc w/o resid deficits 10/02/2020   Stroke [facility])    Thyroid disease     Past Surgical History:  Procedure Laterality Date   back injections     CARDIAC CATHETERIZATION     CATARACT EXTRACTION     CORONARY ANGIOPLASTY     CORONARY ARTERY BYPASS GRAFT     2005   Current Medications: Current Meds  Medication Sig   aspirin EC 81 MG tablet Take 81 mg by mouth daily. Swallow whole.   atorvastatin (LIPITOR) 40 MG tablet Take 1 tablet (40 mg total) by mouth at bedtime.   CALCIUM PO Take 1 tablet by mouth daily.   Cholecalciferol (VITAMIN D-3) 25 MCG (1000 UT) CAPS Take by mouth.   cyanocobalamin (VITAMIN B12) 500 MCG tablet Take 500 mcg by mouth daily.   divalproex (DEPAKOTE) 500 MG DR tablet TAKE 1 TABLET BY MOUTH AT BEDTIME   ELIQUIS 5 MG TABS tablet NEW PRESCRIPTION REQUEST: Eliquis 5 Mg TAKE ONE TABLET BY MOUTH TWICE DAILY (Patient taking differently: Take 5 mg by mouth 2 (two) times daily.)   furosemide (LASIX) 20 MG tablet Take 1 tablet (20 mg total) by mouth daily as needed.   HYDROcodone-acetaminophen (NORCO/VICODIN) 5-325 MG tablet Take 1 tablet by mouth 2 (two) times daily as needed for severe pain. Must last 30 days.   [START ON 12/22/2022] HYDROcodone-acetaminophen (NORCO/VICODIN) 5-325 MG tablet Take 1 tablet by mouth 2 (two) times daily as needed for severe pain. Must last 30 days.   hydroxychloroquine (PLAQUENIL) 200 MG tablet Take 200 mg by mouth 2 (two) times daily.   levothyroxine (SYNTHROID) 137 MCG tablet Take 1 tablet (137 mcg total) by mouth daily before breakfast.   losartan (COZAAR) 25 MG tablet Take half tablet once daily   magnesium oxide (MAG-OX) 400 (240 Mg) MG tablet Take 400 mg by mouth daily.   metoprolol succinate (TOPROL-XL) 100 MG 24 hr tablet TAKE 1/2 TABLET BY MOUTH  AT BEDTIME   Multiple Vitamin (MULTIVITAMIN) tablet Take 1 tablet by mouth daily.   naloxone (NARCAN) nasal spray 4 mg/0.1 mL Place 1 spray into the nose as needed for up to 365 doses (for opioid-induced respiratory depresssion). In case of emergency (overdose), spray once into each nostril. If no response within 3 minutes, repeat application and call [phone].   traZODone (DESYREL) 100 MG tablet TAKE 1 TABLET BY MOUTH AT BEDTIME AS NEEDED FOR SLEEP     Allergies:   Patient has no known allergies.   Social History   Socioeconomic History   Marital status: Widowed    Spouse name: Not on file   Number of children: 3   Years of education: Not on file   Highest education level: Not on file  Occupational History   Not on  file  Tobacco Use   Smoking status: Former    Years: 30.00    Types: Cigarettes    Quit date: 05/2020    Years since quitting: 2.6   Smokeless tobacco: Never   Tobacco comments:    Smoked about 1 pack per month  Vaping Use   Vaping Use: Never used  Substance and Sexual Activity   Alcohol use: Not Currently   Drug use: Yes    Types: Hydrocodone   Sexual activity: Not Currently  Other Topics Concern   Not on file  Social History Narrative   ** Merged History Encounter **       Social Determinants of Health   Financial Resource Strain: Low Risk  (08/14/2022)   Overall Financial Resource Strain (CARDIA)    Difficulty of Paying Living Expenses: Not hard at all  Food Insecurity: No Food Insecurity (08/14/2022)   Hunger Vital Sign    Worried About Running Out of Food in the Last Year: Never true    Ran Out of Food in the Last Year: Never true  Transportation Needs: No Transportation Needs (08/14/2022)   PRAPARE - Hydrologist (Medical): No    Lack of Transportation (Non-Medical): No  Physical Activity: Insufficiently Active (08/14/2022)   Exercise Vital Sign    Days of Exercise per Week: 7 days    Minutes of Exercise per Session: 20  min  Stress: No Stress Concern Present (08/14/2022)   [place]    Feeling of Stress : Not at all  Social Connections: Not on file    Family History: The patient's family history includes Bipolar disorder in her daughter; Breast cancer in her mother; Heart attack in her father. There is no history of Diabetes Mellitus II.  ROS:   Please see the history of present illness.    All other systems reviewed and are negative.  EKGs/Labs/Other Studies Reviewed:    The following studies were reviewed today:  CT 11-25-2022:  MPRESSION: 1. The left atrial appendage is a large chicken wing morphology without thrombus.   2. A 31 mm Watchman FLX device is recommended based on the above landing zone measurements (25.0 Maximum diameter; 19% compression).   3. A mid/mid IAS puncture site is recommended.   4. Optimal deployment angle: RAO 0 CRA 26   5. 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   6. S/p multi-vessel PCI.  Patency cannot be determined.   EKG:  EKG is ordered today.  The ekg ordered today demonstrates atrial fibrillation with HR 91bpm  Recent Labs: 04/14/2022: Pro B Natriuretic peptide (BNP) 430.0 06/22/2022: Magnesium 1.6 10/14/2022: ALT 26; BUN 18; Creatinine, Ser 0.90; Potassium 4.5; Sodium 137 10/27/2022: B Natriuretic Peptide 448.4 12/08/2022: Hemoglobin 13.0; Platelets 237.0; TSH 10.29   Recent Lipid Panel    Component Value Date/Time   CHOL 156 05/09/2022 0559   TRIG 125 05/09/2022 0559   HDL 48 05/09/2022 0559   CHOLHDL 3.3 05/09/2022 0559   VLDL 25 05/09/2022 0559   LDLCALC 83 05/09/2022 0559   LDLDIRECT 99.0 07/26/2021 1432   Risk Assessment/Calculations:    HAS-BLED score 4 Hypertension Yes  Abnormal renal and liver function (Dialysis, transplant, Cr >2.26 mg/dL /Cirrhosis or Bilirubin >2x Normal or AST/ALT/AP >3x Normal) No  Stroke Yes  Bleeding No  Labile INR  (Unstable/high INR) No  Elderly (>65) Yes  Drugs or alcohol (? 8 drinks/week, anti-plt or NSAID) Yes  CHA2DS2-VASc Score = 7  The patient's score is based upon: CHF History: 0 HTN History: 1 Diabetes History: 0 Stroke History: 2 Vascular Disease History: 1 Age Score: 2 Gender Score: 1   Physical Exam:    VS:  BP 122/70   Pulse 91   Ht 5' (1.524 m)   Wt 177 lb (80.3 kg)   SpO2 97%   BMI 34.57 kg/m     Wt Readings from Last 3 Encounters:  12/11/22 177 lb (80.3 kg)  12/08/22 181 lb (82.1 kg)  10/27/22 187 lb 3.2 oz (84.9 kg)    General: Elderly, NAD Skin: Warm, dry, intact, left periorbital ecchymosis noted  Lungs:Clear to ausculation bilaterally. No wheezes, rales, or rhonchi. Breathing is unlabored. Cardiovascular: Irregularly irregular.  Extremities: No edema.  Neuro: Alert and oriented. No focal deficits. No facial asymmetry. MAE spontaneously. Psych: Responds to questions appropriately with normal affect.    ASSESSMENT/PLAN:    Permanent atrial fibrillations: Given hx of recurrent falls, patient referred for LAAO closure with Watchman scheduled for 3/28. Pre-procedure instructions reviewed with understanding. CHG soap given. Obtain BMET and CBC today. Edentulous with dentures therefore will not require SBE.   CAD s/p CABG: with subsequent PCI. Denies anginal symptoms. Currently on ASA with Eliquis for aggressive secondary.   Hx of frequent falls: Workup negative for syncope with monitor with no adverse events noted. Monitor results with atrial fibrillation with 100% burden with rates ranging from 53-161 bpm (avg of 89 bpm), isolated VEs were rare (<1.0%), VE Couplets were rare (<1.0%), and no VE Triplets   HTN: BP stable today with no changes needed at this time.   HLD: Continue atorvastatin   LE edema: Improved. Daughter reports a 15lb weight decrease. No edema noted today   Medication Adjustments/Labs and Tests Ordered: Current medicines are reviewed at  length with the patient today.  Concerns regarding medicines are outlined above.  Orders Placed This Encounter  Procedures   Basic metabolic panel   CBC   EKG 12-Lead   No orders of the defined types were placed in this encounter.   Patient Instructions  Medication Instructions:  Your physician recommends that you continue on your current medications as directed. Please refer to the Current Medication list given to you today.  *If you need a refill on your cardiac medications before your next appointment, please call your pharmacy*   Lab Work: TODAY: BMET, CBC If you have labs (blood work) drawn today and your tests are completely normal, you will receive your results only by: [place] (if you have MyChart) OR A paper copy in the mail If you have any lab test that is abnormal or we need to change your treatment, we will call you to review the results.   Testing/Procedures: SEE INSTRUCTION LETTER   Follow-Up: At [facility], you and your health needs are our priority.  As part of our continuing mission to provide you with exceptional heart care, we have created designated Provider Care Teams.  These Care Teams include your primary Cardiologist (physician) and Advanced Practice Providers (APPs -  Physician Assistants and Nurse Practitioners) who all work together to provide you with the care you need, when you need it.  We recommend signing up for the patient portal called "MyChart".  Sign up information is provided on this After Visit Summary.  MyChart is used to connect with patients for Virtual Visits (Telemedicine).  Patients are able to view lab/test results, encounter notes, upcoming appointments, etc.  Non-urgent messages can be sent to your provider as well.   To learn more about what you can do with MyChart, go to [email or web address].    Your next appointment:   KEEP SCHEDULED FOLLOW-UP   Signed, Kathyrn Drown, NP  12/11/2022 9:07 AM    [place]

## 2022-12-07 NOTE — Progress Notes (Signed)
  Care Coordination Note  12/07/2022 Name: Deeksha Mcleland MRN: [id number] DOB: 1941-03-20  Khaloni Lovern is a 82 y.o. year old female who is a primary care patient of Eugenia Pancoast, [place] and is actively engaged with the care management team. I reached out to [facility] by phone today to assist with re-scheduling a follow up visit with the RN Case Manager  Follow up plan: We have been unable to make contact with the patient for follow up.   Julian Hy, [place] Direct Dial: [phone]

## 2022-12-08 ENCOUNTER — Encounter: Payer: Self-pay | Admitting: Family

## 2022-12-08 ENCOUNTER — Other Ambulatory Visit: Payer: Self-pay | Admitting: Family

## 2022-12-08 ENCOUNTER — Ambulatory Visit (INDEPENDENT_AMBULATORY_CARE_PROVIDER_SITE_OTHER): Payer: Medicare Other | Admitting: Family

## 2022-12-08 ENCOUNTER — Telehealth: Payer: Self-pay | Admitting: Family

## 2022-12-08 VITALS — BP 130/82 | HR 90 | Temp 97.9°F | Ht 60.0 in | Wt 181.0 lb

## 2022-12-08 DIAGNOSIS — D649 Anemia, unspecified: Secondary | ICD-10-CM

## 2022-12-08 DIAGNOSIS — Z9181 History of falling: Secondary | ICD-10-CM

## 2022-12-08 DIAGNOSIS — I10 Essential (primary) hypertension: Secondary | ICD-10-CM | POA: Diagnosis not present

## 2022-12-08 DIAGNOSIS — E039 Hypothyroidism, unspecified: Secondary | ICD-10-CM | POA: Diagnosis not present

## 2022-12-08 DIAGNOSIS — R55 Syncope and collapse: Secondary | ICD-10-CM | POA: Diagnosis not present

## 2022-12-08 DIAGNOSIS — M1180 Other specified crystal arthropathies, unspecified site: Secondary | ICD-10-CM | POA: Diagnosis not present

## 2022-12-08 DIAGNOSIS — I4821 Permanent atrial fibrillation: Secondary | ICD-10-CM | POA: Diagnosis not present

## 2022-12-08 DIAGNOSIS — R809 Proteinuria, unspecified: Secondary | ICD-10-CM

## 2022-12-08 LAB — CBC WITH DIFFERENTIAL/PLATELET
Basophils Absolute: 0.1 10*3/uL (ref 0.0–0.1)
Basophils Relative: 0.8 % (ref 0.0–3.0)
Eosinophils Absolute: 0.2 10*3/uL (ref 0.0–0.7)
Eosinophils Relative: 3.3 % (ref 0.0–5.0)
HCT: 39.8 % (ref 36.0–46.0)
Hemoglobin: 13 g/dL (ref 12.0–15.0)
Lymphocytes Relative: 21.9 % (ref 12.0–46.0)
Lymphs Abs: 1.4 10*3/uL (ref 0.7–4.0)
MCHC: 32.7 g/dL (ref 30.0–36.0)
MCV: 87.7 fl (ref 78.0–100.0)
Monocytes Absolute: 0.8 10*3/uL (ref 0.1–1.0)
Monocytes Relative: 11.7 % (ref 3.0–12.0)
Neutro Abs: 4 10*3/uL (ref 1.4–7.7)
Neutrophils Relative %: 62.3 % (ref 43.0–77.0)
Platelets: 237 10*3/uL (ref 150.0–400.0)
RBC: 4.54 Mil/uL (ref 3.87–5.11)
RDW: 14.9 % (ref 11.5–15.5)
WBC: 6.4 10*3/uL (ref 4.0–10.5)

## 2022-12-08 LAB — IBC + FERRITIN
Ferritin: 21.2 ng/mL (ref 10.0–291.0)
Iron: 54 ug/dL (ref 42–145)
Saturation Ratios: 14.7 % — ABNORMAL LOW (ref 20.0–50.0)
TIBC: 368.2 ug/dL (ref 250.0–450.0)
Transferrin: 263 mg/dL (ref 212.0–360.0)

## 2022-12-08 LAB — TSH: TSH: 10.29 u[IU]/mL — ABNORMAL HIGH (ref 0.35–5.50)

## 2022-12-08 LAB — MICROALBUMIN / CREATININE URINE RATIO
Creatinine,U: 33.5 mg/dL
Microalb Creat Ratio: 9.2 mg/g (ref 0.0–30.0)
Microalb, Ur: 3.1 mg/dL — ABNORMAL HIGH (ref 0.0–1.9)

## 2022-12-08 MED ORDER — LOSARTAN POTASSIUM 25 MG PO TABS: ORAL_TABLET | ORAL | 3 refills | Status: DC

## 2022-12-08 MED ORDER — LEVOTHYROXINE SODIUM 137 MCG PO TABS: 137.0000 ug | ORAL_TABLET | Freq: Every day | ORAL | 0 refills | Status: DC

## 2022-12-08 NOTE — Assessment & Plan Note (Addendum)
Discussed with daugther reducing fall risks in home, replacing rugs with non slip, installing shower handlebars as well as grips in shower and shower seat. Adequate lighting in home.

## 2022-12-08 NOTE — Assessment & Plan Note (Signed)
Slight, and new since last lab draw. Pt on blood thinner.  Will recheck cbc today pending results.

## 2022-12-08 NOTE — Progress Notes (Signed)
Established Patient Office Visit  Subjective:      CC:  Chief Complaint  Patient presents with   Medical Management of Chronic Issues    HPI: Carmen [name] is a 82 y.o. female presenting on 12/08/2022 for Medical Management of Chronic Issues . Wt Readings from Last 3 Encounters:  12/08/22 181 lb (82.1 kg)  10/27/22 187 lb 3.2 oz (84.9 kg)  10/27/22 187 lb (84.8 kg)   Afib and pedal edema, followed closely by cardiologist.  Has watchman procedure coming up 3/28 and pre op Monday  Furosemide now as needed, has helped significantly with pedal edema. Daughter has been giving her to once in the am and trying to work with her on elevating legs.   Moved in with daughter over the last two weeks. So far doing well.   Increased fall risk, has picked up all of her throw rigs at home now that pt had a call into the cat bowl about two weeks ago. Has been better with using her walker at home, and using cane here and there. Daughter is working on a [employer] with a built in Multimedia programmer. Was supposed to be getting PT outpatient.   Hypothyroid: Levohtyroxine 137 mcg once daily. Not under control.  Lab Results  Component Value Date   TSH 3.93 09/28/2022   Lab Results  Component Value Date   WBC 7.6 10/14/2022   HGB 11.9 (L) 10/14/2022   HCT 35.0 (L) 10/14/2022   MCV 95.3 10/14/2022   PLT 225 10/14/2022   Slight anemia one month ago with decreased hematocrit.        Social history:  Relevant past medical, surgical, family and social history reviewed and updated as indicated. Interim medical history since our last visit reviewed.  Allergies and medications reviewed and updated.  DATA REVIEWED: CHART IN EPIC     ROS: Negative unless specifically indicated above in HPI.    Current Outpatient Medications:    aspirin EC 81 MG tablet, Take 81 mg by mouth daily. Swallow whole., Disp: , Rfl:    atorvastatin (LIPITOR) 40 MG tablet, Take 1 tablet (40 mg total) by mouth at bedtime.,  Disp: 90 tablet, Rfl: 0   CALCIUM PO, Take 1 tablet by mouth daily., Disp: , Rfl:    Cholecalciferol (VITAMIN D-3) 25 MCG (1000 UT) CAPS, Take by mouth., Disp: , Rfl:    cyanocobalamin (VITAMIN B12) 500 MCG tablet, Take 500 mcg by mouth daily., Disp: , Rfl:    divalproex (DEPAKOTE) 500 MG DR tablet, TAKE 1 TABLET BY MOUTH AT BEDTIME, Disp: 90 tablet, Rfl: 0   ELIQUIS 5 MG TABS tablet, NEW PRESCRIPTION REQUEST: Eliquis 5 Mg TAKE ONE TABLET BY MOUTH TWICE DAILY (Patient taking differently: Take 5 mg by mouth 2 (two) times daily.), Disp: 180 tablet, Rfl: 3   furosemide (LASIX) 20 MG tablet, Take 1 tablet (20 mg total) by mouth daily as needed., Disp: 30 tablet, Rfl: 2   HYDROcodone-acetaminophen (NORCO/VICODIN) 5-325 MG tablet, Take 1 tablet by mouth 2 (two) times daily as needed for severe pain. Must last 30 days. (Patient taking differently: Take 1 tablet by mouth 1 day or 1 dose. PRN Must last 30 days.), Disp: 60 tablet, Rfl: 0   HYDROcodone-acetaminophen (NORCO/VICODIN) 5-325 MG tablet, Take 1 tablet by mouth 2 (two) times daily as needed for severe pain. Must last 30 days., Disp: 60 tablet, Rfl: 0   [START ON 12/22/2022] HYDROcodone-acetaminophen (NORCO/VICODIN) 5-325 MG tablet, Take 1 tablet by mouth 2 (two)  times daily as needed for severe pain. Must last 30 days., Disp: 60 tablet, Rfl: 0   magnesium oxide (MAG-OX) 400 (240 Mg) MG tablet, Take 400 mg by mouth daily., Disp: , Rfl:    metoprolol succinate (TOPROL-XL) 100 MG 24 hr tablet, TAKE 1/2 TABLET BY MOUTH AT BEDTIME, Disp: 45 tablet, Rfl: 0   Multiple Vitamin (MULTIVITAMIN) tablet, Take 1 tablet by mouth daily., Disp: , Rfl:    naloxone (NARCAN) nasal spray 4 mg/0.1 mL, Place 1 spray into the nose as needed for up to 365 doses (for opioid-induced respiratory depresssion). In case of emergency (overdose), spray once into each nostril. If no response within 3 minutes, repeat application and call [phone]., Disp: 1 each, Rfl: 0   traZODone (DESYREL)  100 MG tablet, TAKE 1 TABLET BY MOUTH AT BEDTIME AS NEEDED FOR SLEEP, Disp: 90 tablet, Rfl: 0   hydroxychloroquine (PLAQUENIL) 200 MG tablet, Take 200 mg by mouth 2 (two) times daily., Disp: , Rfl:    levothyroxine (SYNTHROID) 137 MCG tablet, Take 1 tablet (137 mcg total) by mouth daily before breakfast., Disp: 90 tablet, Rfl: 0      Objective:    BP 130/82   Pulse 90   Temp 97.9 F (36.6 C) (Temporal)   Ht 5' (1.524 m)   Wt 181 lb (82.1 kg)   SpO2 99%   BMI 35.35 kg/m   Wt Readings from Last 3 Encounters:  12/08/22 181 lb (82.1 kg)  10/27/22 187 lb 3.2 oz (84.9 kg)  10/27/22 187 lb (84.8 kg)    Physical Exam Constitutional:      General: She is not in acute distress.    Appearance: Normal appearance. She is obese. She is not ill-appearing, toxic-appearing or diaphoretic.  HENT:     Head: Normocephalic.  Cardiovascular:     Rate and Rhythm: Normal rate and regular rhythm.  Pulmonary:     Effort: Pulmonary effort is normal.  Musculoskeletal:        General: Normal range of motion.     Right lower leg: 1+ Edema present.     Left lower leg: 1+ Edema present.  Neurological:     General: No focal deficit present.     Mental Status: She is alert and oriented to person, place, and time. Mental status is at baseline.  Psychiatric:        Mood and Affect: Mood normal.        Behavior: Behavior normal.        Thought Content: Thought content normal.        Judgment: Judgment normal.            Assessment & Plan:  Anemia, unspecified type Assessment & Plan: Slight, and new since last lab draw. Pt on blood thinner.  Will recheck cbc today pending results.  Orders: -     CBC with Differential/Platelet -     IBC + Ferritin  Calcium pyrophosphate arthropathy  Acquired hypothyroidism Assessment & Plan: Continue levothyroxine.  Ordering tsh pending results.  Orders: -     TSH  Essential hypertension -     Microalbumin / creatinine urine ratio  At risk for  falls Assessment & Plan: Discussed with daugther reducing fall risks in home, replacing rugs with non slip, installing shower handlebars as well as grips in shower and shower seat. Adequate lighting in home.   Other orders -     Levothyroxine Sodium; Take 1 tablet (137 mcg total) by mouth daily before breakfast.  Dispense: 90 tablet; Refill: 0     Return in about 6 months (around 06/10/2023), or f/u thyroid.  Eugenia Pancoast, MSN, APRN, FNP-C [place]

## 2022-12-08 NOTE — Assessment & Plan Note (Signed)
Continue levothyroxine.  Ordering tsh pending results.

## 2022-12-08 NOTE — Telephone Encounter (Signed)
Referral for physical therapy placed 10/20/22, pt states never heard anything. Do I need a new referral or can we reach out for this to get it going?

## 2022-12-08 NOTE — Patient Instructions (Signed)
  Stop by the lab prior to leaving today. I will notify you of your results once received.    Regards,   Marisabel Macpherson FNP-C  

## 2022-12-11 ENCOUNTER — Ambulatory Visit: Payer: Medicare Other | Attending: Cardiology | Admitting: Cardiology

## 2022-12-11 VITALS — BP 122/70 | HR 91 | Ht 60.0 in | Wt 177.0 lb

## 2022-12-11 DIAGNOSIS — I4821 Permanent atrial fibrillation: Secondary | ICD-10-CM

## 2022-12-11 DIAGNOSIS — E785 Hyperlipidemia, unspecified: Secondary | ICD-10-CM

## 2022-12-11 DIAGNOSIS — Z01818 Encounter for other preprocedural examination: Secondary | ICD-10-CM | POA: Diagnosis not present

## 2022-12-11 DIAGNOSIS — I25810 Atherosclerosis of coronary artery bypass graft(s) without angina pectoris: Secondary | ICD-10-CM | POA: Diagnosis not present

## 2022-12-11 DIAGNOSIS — R296 Repeated falls: Secondary | ICD-10-CM | POA: Diagnosis not present

## 2022-12-11 DIAGNOSIS — I10 Essential (primary) hypertension: Secondary | ICD-10-CM

## 2022-12-11 LAB — BASIC METABOLIC PANEL
BUN/Creatinine Ratio: 13 (ref 12–28)
BUN: 13 mg/dL (ref 8–27)
CO2: 30 mmol/L — ABNORMAL HIGH (ref 20–29)
Calcium: 9.9 mg/dL (ref 8.7–10.3)
Chloride: 100 mmol/L (ref 96–106)
Creatinine, Ser: 0.98 mg/dL (ref 0.57–1.00)
Glucose: 97 mg/dL (ref 70–99)
Potassium: 4.5 mmol/L (ref 3.5–5.2)
Sodium: 139 mmol/L (ref 134–144)
eGFR: 58 mL/min/{1.73_m2} — ABNORMAL LOW (ref >59)

## 2022-12-11 LAB — CBC
Hematocrit: 38.7 % (ref 34.0–46.6)
Hemoglobin: 12.8 g/dL (ref 11.1–15.9)
MCH: 28.5 pg (ref 26.6–33.0)
MCHC: 33.1 g/dL (ref 31.5–35.7)
MCV: 86 fL (ref 79–97)
Platelets: 221 10*3/uL (ref 150–450)
RBC: 4.49 x10E6/uL (ref 3.77–5.28)
RDW: 15.1 % (ref 11.7–15.4)
WBC: 7.2 10*3/uL (ref 3.4–10.8)

## 2022-12-11 NOTE — Patient Instructions (Addendum)
Medication Instructions:  Your physician recommends that you continue on your current medications as directed. Please refer to the Current Medication list given to you today.  *If you need a refill on your cardiac medications before your next appointment, please call your pharmacy*   Lab Work: TODAY: BMET, CBC If you have labs (blood work) drawn today and your tests are completely normal, you will receive your results only by: MyChart Message (if you have MyChart) OR A paper copy in the mail If you have any lab test that is abnormal or we need to change your treatment, we will call you to review the results.   Testing/Procedures: SEE INSTRUCTION LETTER   Follow-Up: At [facility], you and your health needs are our priority.  As part of our continuing mission to provide you with exceptional heart care, we have created designated Provider Care Teams.  These Care Teams include your primary Cardiologist (physician) and Advanced Practice Providers (APPs -  Physician Assistants and Nurse Practitioners) who all work together to provide you with the care you need, when you need it.  We recommend signing up for the patient portal called "MyChart".  Sign up information is provided on this After Visit Summary.  MyChart is used to connect with patients for Virtual Visits (Telemedicine).  Patients are able to view lab/test results, encounter notes, upcoming appointments, etc.  Non-urgent messages can be sent to your provider as well.   To learn more about what you can do with MyChart, go to [email or web address].    Your next appointment:   KEEP SCHEDULED FOLLOW-UP 

## 2022-12-14 ENCOUNTER — Telehealth: Payer: Self-pay | Admitting: *Deleted

## 2022-12-14 NOTE — Telephone Encounter (Signed)
-----   Message from Carmen Mu, PA-C sent at 12/12/2022 11:08 AM EDT ----- Heart monitor showed 100% A-fib burden with an average ventricular rate of 89 bpm (range 53 to 161 bpm).  No evidence of significant arrhythmia, prolonged pauses, or evidence of high-grade AV block that would contribute to her questionable history of syncope with frequent falls.  Recommend she continue to follow-up with EP for ongoing management.

## 2022-12-14 NOTE — Telephone Encounter (Signed)
Left voicemail message to call back for review of results.  

## 2022-12-15 NOTE — Telephone Encounter (Signed)
Left voicemail message to call back for review of results.  

## 2022-12-15 NOTE — Telephone Encounter (Signed)
Reviewed results and recommendations with patients daughter per release form. She verbalized understanding with no further questions at this time.

## 2022-12-20 ENCOUNTER — Ambulatory Visit: Payer: Medicare Other | Attending: Physician Assistant

## 2022-12-20 DIAGNOSIS — I4821 Permanent atrial fibrillation: Secondary | ICD-10-CM

## 2022-12-20 DIAGNOSIS — R55 Syncope and collapse: Secondary | ICD-10-CM | POA: Diagnosis not present

## 2022-12-20 LAB — ECHOCARDIOGRAM COMPLETE
AR max vel: 2.4 cm2
AV Area VTI: 2.33 cm2
AV Area mean vel: 2.23 cm2
AV Mean grad: 1 mmHg
AV Peak grad: 2.2 mmHg
Ao pk vel: 0.73 m/s
Calc EF: 54.5 %
S' Lateral: 3.4 cm
Single Plane A2C EF: 48.5 %
Single Plane A4C EF: 59.6 %

## 2022-12-26 ENCOUNTER — Telehealth: Payer: Self-pay

## 2022-12-26 NOTE — Telephone Encounter (Signed)
Left message to call back  

## 2022-12-27 NOTE — Telephone Encounter (Signed)
Spoke with the patient's daughter, who will bring her to procedure tomorrow. Confirmed procedure date of tomorrow, 12/28/2022.Marland Kitchen Confirmed arrival time of 0900 for procedure time at 1130. Reviewed pre-procedure instructions. She has since started losartan 12.5 mg daily- instructed her to have the patient take that the AM of procedure as well. She understands to call if questions/concerns arise prior to procedure.

## 2022-12-28 ENCOUNTER — Inpatient Hospital Stay (HOSPITAL_COMMUNITY): Payer: Medicare Other | Admitting: Anesthesiology

## 2022-12-28 ENCOUNTER — Inpatient Hospital Stay (HOSPITAL_COMMUNITY)
Admission: RE | Admit: 2022-12-28 | Discharge: 2022-12-29 | DRG: 274 | Disposition: A | Discharge status: Home / Self Care | Payer: Medicare Other | Source: Ambulatory Visit | Attending: Cardiology | Admitting: Cardiology

## 2022-12-28 ENCOUNTER — Other Ambulatory Visit: Payer: Self-pay

## 2022-12-28 ENCOUNTER — Inpatient Hospital Stay (HOSPITAL_COMMUNITY): Payer: Medicare Other

## 2022-12-28 ENCOUNTER — Encounter (HOSPITAL_COMMUNITY): Payer: Self-pay | Admitting: Cardiology

## 2022-12-28 ENCOUNTER — Encounter (HOSPITAL_COMMUNITY)
Admission: RE | Disposition: A | Discharge status: Home / Self Care | Payer: Self-pay | Source: Ambulatory Visit | Attending: Cardiology

## 2022-12-28 DIAGNOSIS — E039 Hypothyroidism, unspecified: Secondary | ICD-10-CM | POA: Diagnosis present

## 2022-12-28 DIAGNOSIS — Z7901 Long term (current) use of anticoagulants: Secondary | ICD-10-CM | POA: Diagnosis not present

## 2022-12-28 DIAGNOSIS — I119 Hypertensive heart disease without heart failure: Secondary | ICD-10-CM | POA: Diagnosis not present

## 2022-12-28 DIAGNOSIS — Z79899 Other long term (current) drug therapy: Secondary | ICD-10-CM | POA: Diagnosis not present

## 2022-12-28 DIAGNOSIS — F419 Anxiety disorder, unspecified: Secondary | ICD-10-CM | POA: Diagnosis present

## 2022-12-28 DIAGNOSIS — E1151 Type 2 diabetes mellitus with diabetic peripheral angiopathy without gangrene: Secondary | ICD-10-CM | POA: Diagnosis present

## 2022-12-28 DIAGNOSIS — Z8673 Personal history of transient ischemic attack (TIA), and cerebral infarction without residual deficits: Secondary | ICD-10-CM | POA: Diagnosis not present

## 2022-12-28 DIAGNOSIS — Z87891 Personal history of nicotine dependence: Secondary | ICD-10-CM

## 2022-12-28 DIAGNOSIS — I4891 Unspecified atrial fibrillation: Secondary | ICD-10-CM | POA: Diagnosis present

## 2022-12-28 DIAGNOSIS — F32A Depression, unspecified: Secondary | ICD-10-CM | POA: Diagnosis present

## 2022-12-28 DIAGNOSIS — Z006 Encounter for examination for normal comparison and control in clinical research program: Secondary | ICD-10-CM | POA: Diagnosis not present

## 2022-12-28 DIAGNOSIS — I2510 Atherosclerotic heart disease of native coronary artery without angina pectoris: Secondary | ICD-10-CM | POA: Diagnosis not present

## 2022-12-28 DIAGNOSIS — I4821 Permanent atrial fibrillation: Secondary | ICD-10-CM

## 2022-12-28 DIAGNOSIS — R296 Repeated falls: Secondary | ICD-10-CM | POA: Diagnosis present

## 2022-12-28 DIAGNOSIS — Z7982 Long term (current) use of aspirin: Secondary | ICD-10-CM

## 2022-12-28 DIAGNOSIS — G894 Chronic pain syndrome: Secondary | ICD-10-CM | POA: Diagnosis present

## 2022-12-28 DIAGNOSIS — Z79890 Hormone replacement therapy (postmenopausal): Secondary | ICD-10-CM | POA: Diagnosis not present

## 2022-12-28 DIAGNOSIS — Z951 Presence of aortocoronary bypass graft: Secondary | ICD-10-CM

## 2022-12-28 DIAGNOSIS — E785 Hyperlipidemia, unspecified: Secondary | ICD-10-CM | POA: Diagnosis present

## 2022-12-28 DIAGNOSIS — Z01818 Encounter for other preprocedural examination: Secondary | ICD-10-CM | POA: Diagnosis not present

## 2022-12-28 HISTORY — PX: TEE WITHOUT CARDIOVERSION: SHX5443

## 2022-12-28 HISTORY — PX: LEFT ATRIAL APPENDAGE OCCLUSION: EP1229

## 2022-12-28 LAB — SURGICAL PCR SCREEN
MRSA, PCR: NEGATIVE
Staphylococcus aureus: NEGATIVE

## 2022-12-28 LAB — TYPE AND SCREEN
ABO/RH(D): A POS
Antibody Screen: NEGATIVE

## 2022-12-28 LAB — ECHO TEE

## 2022-12-28 LAB — POCT ACTIVATED CLOTTING TIME: Activated Clotting Time: 309 seconds

## 2022-12-28 LAB — ABO/RH: ABO/RH(D): A POS

## 2022-12-28 SURGERY — LEFT ATRIAL APPENDAGE OCCLUSION
Anesthesia: General

## 2022-12-28 MED ORDER — HEPARIN (PORCINE) IN NACL 1000-0.9 UT/500ML-% IV SOLN
INTRAVENOUS | Status: DC | PRN
  Administered 2022-12-28: 500 mL

## 2022-12-28 MED ORDER — CHLORHEXIDINE GLUCONATE 0.12 % MT SOLN
OROMUCOSAL | Status: AC
  Administered 2022-12-28: 15 mL
  Filled 2022-12-28: qty 15

## 2022-12-28 MED ORDER — SODIUM CHLORIDE 0.9% FLUSH: 3.0000 mL | INTRAVENOUS | Status: DC | PRN

## 2022-12-28 MED ORDER — ACETAMINOPHEN 325 MG PO TABS: 650.0000 mg | ORAL_TABLET | ORAL | Status: DC | PRN

## 2022-12-28 MED ORDER — LACTATED RINGERS IV SOLN: INTRAVENOUS | Status: DC | PRN

## 2022-12-28 MED ORDER — FENTANYL CITRATE (PF) 100 MCG/2ML IJ SOLN
INTRAMUSCULAR | Status: DC | PRN
  Administered 2022-12-28 (×2): 50 ug via INTRAVENOUS

## 2022-12-28 MED ORDER — HYDROCODONE-ACETAMINOPHEN 5-325 MG PO TABS
1.0000 | ORAL_TABLET | Freq: Two times a day (BID) | ORAL | Status: DC | PRN
  Filled 2022-12-28: qty 1

## 2022-12-28 MED ORDER — LIDOCAINE-EPINEPHRINE 1 %-1:100000 IJ SOLN
INTRAMUSCULAR | Status: AC
  Filled 2022-12-28: qty 1

## 2022-12-28 MED ORDER — TRAZODONE HCL 50 MG PO TABS
100.0000 mg | ORAL_TABLET | Freq: Every evening | ORAL | Status: DC | PRN
  Administered 2022-12-29: 100 mg via ORAL
  Filled 2022-12-28: qty 2

## 2022-12-28 MED ORDER — PROPOFOL 10 MG/ML IV BOLUS
INTRAVENOUS | Status: DC | PRN
  Administered 2022-12-28: 130 mg via INTRAVENOUS

## 2022-12-28 MED ORDER — PHENYLEPHRINE HCL (PRESSORS) 10 MG/ML IV SOLN
INTRAVENOUS | Status: DC | PRN
  Administered 2022-12-28: 80 ug via INTRAVENOUS

## 2022-12-28 MED ORDER — IOHEXOL 350 MG/ML SOLN
INTRAVENOUS | Status: DC | PRN
  Administered 2022-12-28: 20 mL

## 2022-12-28 MED ORDER — LIDOCAINE HCL (CARDIAC) PF 100 MG/5ML IV SOSY
PREFILLED_SYRINGE | INTRAVENOUS | Status: DC | PRN
  Administered 2022-12-28: 100 mg via INTRATRACHEAL

## 2022-12-28 MED ORDER — HEPARIN (PORCINE) IN NACL 2000-0.9 UNIT/L-% IV SOLN
INTRAVENOUS | Status: DC | PRN
  Administered 2022-12-28: 1000 mL

## 2022-12-28 MED ORDER — CHLORHEXIDINE GLUCONATE 4 % EX LIQD
Freq: Once | CUTANEOUS | Status: DC
  Filled 2022-12-28: qty 15

## 2022-12-28 MED ORDER — PROTAMINE SULFATE 10 MG/ML IV SOLN
INTRAVENOUS | Status: DC | PRN
  Administered 2022-12-28: 10 mg via INTRAVENOUS
  Administered 2022-12-28: 20 mg via INTRAVENOUS

## 2022-12-28 MED ORDER — APIXABAN 5 MG PO TABS
5.0000 mg | ORAL_TABLET | Freq: Two times a day (BID) | ORAL | Status: DC
  Administered 2022-12-28 – 2022-12-29 (×2): 5 mg via ORAL
  Filled 2022-12-28 (×2): qty 1

## 2022-12-28 MED ORDER — DEXAMETHASONE SODIUM PHOSPHATE 10 MG/ML IJ SOLN
INTRAMUSCULAR | Status: DC | PRN
  Administered 2022-12-28: 10 mg via INTRAVENOUS

## 2022-12-28 MED ORDER — SODIUM CHLORIDE 0.9 % IV SOLN: INTRAVENOUS | Status: DC

## 2022-12-28 MED ORDER — ROCURONIUM BROMIDE 10 MG/ML (PF) SYRINGE
PREFILLED_SYRINGE | INTRAVENOUS | Status: DC | PRN
  Administered 2022-12-28: 80 mg via INTRAVENOUS

## 2022-12-28 MED ORDER — SODIUM CHLORIDE 0.9 % IV SOLN: 250.0000 mL | INTRAVENOUS | Status: DC | PRN

## 2022-12-28 MED ORDER — PHENYLEPHRINE HCL-NACL 20-0.9 MG/250ML-% IV SOLN
INTRAVENOUS | Status: DC | PRN
  Administered 2022-12-28: 15 ug/min via INTRAVENOUS

## 2022-12-28 MED ORDER — ONDANSETRON HCL 4 MG/2ML IJ SOLN
INTRAMUSCULAR | Status: DC | PRN
  Administered 2022-12-28: 4 mg via INTRAVENOUS

## 2022-12-28 MED ORDER — SUGAMMADEX SODIUM 200 MG/2ML IV SOLN
INTRAVENOUS | Status: DC | PRN
  Administered 2022-12-28: 150 mg via INTRAVENOUS

## 2022-12-28 MED ORDER — CEFAZOLIN SODIUM-DEXTROSE 2-4 GM/100ML-% IV SOLN
2.0000 g | INTRAVENOUS | Status: AC
  Administered 2022-12-28: 2 g via INTRAVENOUS
  Filled 2022-12-28: qty 100

## 2022-12-28 MED ORDER — ACETAMINOPHEN 500 MG PO TABS
1000.0000 mg | ORAL_TABLET | Freq: Once | ORAL | Status: AC
  Administered 2022-12-28: 1000 mg via ORAL
  Filled 2022-12-28: qty 2

## 2022-12-28 MED ORDER — HEPARIN SODIUM (PORCINE) 1000 UNIT/ML IJ SOLN
INTRAMUSCULAR | Status: DC | PRN
  Administered 2022-12-28: 12000 [IU] via INTRAVENOUS

## 2022-12-28 MED ORDER — SODIUM CHLORIDE 0.9% FLUSH
3.0000 mL | Freq: Two times a day (BID) | INTRAVENOUS | Status: DC
  Administered 2022-12-28: 3 mL via INTRAVENOUS

## 2022-12-28 SURGICAL SUPPLY — 18 items
CATH INFINITI 5FR ANG PIGTAIL (CATHETERS) IMPLANT
CLOSURE PERCLOSE PROSTYLE (VASCULAR PRODUCTS) IMPLANT
DEVICE WATCHMAN FLX PROC (KITS) IMPLANT
DILATOR VESSEL 10FR 20CM (INTRODUCER) IMPLANT
KIT HEART LEFT (KITS) ×1 IMPLANT
KIT SHEA VERSACROSS LAAC CONNE (KITS) IMPLANT
PACK CARDIAC CATHETERIZATION (CUSTOM PROCEDURE TRAY) ×1 IMPLANT
PAD DEFIB RADIO PHYSIO CONN (PAD) ×1 IMPLANT
SHEATH PERFORMER 16FR 30 (SHEATH) IMPLANT
SHEATH PINNACLE 8F 10CM (SHEATH) IMPLANT
SHEATH PROBE COVER 6X72 (BAG) ×1 IMPLANT
SYS WATCHMAN FXD DBL (SHEATH) ×1
SYSTEM WATCHMAN FXD DBL (SHEATH) IMPLANT
TRANSDUCER W/STOPCOCK (MISCELLANEOUS) ×1 IMPLANT
TUBING CIL FLEX 10 FLL-RA (TUBING) ×1 IMPLANT
WATCHMAN FLX 31 (Prosthesis & Implant Heart) IMPLANT
WATCHMAN FLX PROCEDURE DEVICE (KITS) ×1 IMPLANT
WATCHMAN PROCED TRUSEAL ACCESS (SHEATH) IMPLANT

## 2022-12-28 NOTE — Transfer of Care (Signed)
Immediate Anesthesia Transfer of Care Note  Patient: Carmen [name]  Procedure(s) Performed: LEFT ATRIAL APPENDAGE OCCLUSION TRANSESOPHAGEAL ECHOCARDIOGRAM  Patient Location: Cath Lab  Anesthesia Type:General  Level of Consciousness: awake, alert , and oriented  Airway & Oxygen Therapy: Patient Spontanous Breathing and Patient connected to nasal cannula oxygen  Post-op Assessment: Report given to RN and Post -op Vital signs reviewed and stable  Post vital signs: Reviewed and stable  Last Vitals:  Vitals Value Taken Time  BP 149/98 12/28/22 1245  Temp 36.6 C 12/28/22 1239  Pulse    Resp 21 12/28/22 1247  SpO2    Vitals shown include unvalidated device data.  Last Pain:  Vitals:   12/28/22 1239  TempSrc: Temporal  PainSc: Asleep         Complications: There were no known notable events for this encounter.

## 2022-12-28 NOTE — Progress Notes (Signed)
Non-compliant  in keeping her affected right leg straight.would get mad when reminded. No bleeding noted.

## 2022-12-28 NOTE — Progress Notes (Signed)
Dr. Quentin Ore called and notified of rt groin site continued oozing; pressure held x15 minutes x2 separate times and redressed twice. At bedside injecting lido w/epi into tract; placed figure 8 stitch. No hematoma. Redressed w/gauze and tegaderm w/suture visible beyond gauze.

## 2022-12-28 NOTE — Anesthesia Preprocedure Evaluation (Addendum)
Anesthesia Evaluation  Patient identified by MRN, date of birth, ID band Patient awake    Reviewed: Allergy & Precautions, NPO status , Patient's Chart, lab work & pertinent test results, reviewed documented beta blocker date and time   History of Anesthesia Complications Negative for: history of anesthetic complications  Airway Mallampati: II  TM Distance: >3 FB Neck ROM: Full    Dental  (+) Dental Advisory Given, Edentulous Upper, Edentulous Lower   Pulmonary former smoker   Pulmonary exam normal        Cardiovascular hypertension, Pt. on medications and Pt. on home beta blockers + CAD, + CABG and + Peripheral Vascular Disease  + dysrhythmias Atrial Fibrillation  Rhythm:Irregular Rate:Normal  IMPRESSIONS      1. Left ventricular ejection fraction, by estimation, is 55 to 60%. The left ventricle has normal function. The left ventricle has no regional wall motion abnormalities. There is mild left ventricular hypertrophy. Left ventricular diastolic parameters are indeterminate.  2. Right ventricular systolic function is normal. The right ventricular size is mildly enlarged.  3. Left atrial size was severely dilated.  4. The mitral valve is normal in structure. Mild mitral valve regurgitation.  5. The aortic valve was not well visualized. Aortic valve regurgitation is not visualized.  6. The inferior vena cava is normal in size with greater than 50% respiratory variability, suggesting right atrial pressure of 3 mmHg.    Neuro/Psych  PSYCHIATRIC DISORDERS Anxiety Depression    CVA    GI/Hepatic negative GI ROS, Neg liver ROS,,,  Endo/Other  Hypothyroidism    Renal/GU negative Renal ROS     Musculoskeletal negative musculoskeletal ROS (+)    Abdominal   Peds  Hematology negative hematology ROS (+)   Anesthesia Other Findings   Reproductive/Obstetrics                             Anesthesia  Physical Anesthesia Plan  ASA: 3  Anesthesia Plan: General   Post-op Pain Management: Minimal or no pain anticipated   Induction:   PONV Risk Score and Plan: 3 and Ondansetron and Dexamethasone  Airway Management Planned: Oral ETT  Additional Equipment: ClearSight  Intra-op Plan:   Post-operative Plan: Extubation in OR  Informed Consent: I have reviewed the patients History and Physical, chart, labs and discussed the procedure including the risks, benefits and alternatives for the proposed anesthesia with the patient or authorized representative who has indicated his/her understanding and acceptance.     Dental advisory given  Plan Discussed with: Anesthesiologist and CRNA  Anesthesia Plan Comments:        Anesthesia Quick Evaluation

## 2022-12-28 NOTE — Progress Notes (Addendum)
EP BRIEF UPDATE NOTE  Called for R groin site oozing.   Mild oozing from skin incision that is still bleeding after holding pressure. No hematoma. Distal perfusion intact.  - injected 15cc lido w/ epi in the tract and at skin surface - figure of 8 suture applied to femoral venous access site. To be removed in AM by MD/APP - monitor overnight, tentative discharge in AM.  Lysbeth Galas T. Quentin Ore, MD, [facility], [facility] Cardiac Electrophysiology

## 2022-12-28 NOTE — H&P (Signed)
Electrophysiology Office Note:     Date:  12/28/2022    ID:  [name], DOB 1940-11-20, MRN [id number]   PCP:  Eugenia Pancoast, [facility] Cardiologist:  Nelva Bush, MD  [facility] Electrophysiologist:  Vickie Epley, MD    Referring MD: Nelva Bush, MD    Chief Complaint: Atrial fibrillation   History of Present Illness:     Carmen [name] is a 82 y.o. female who presents for an evaluation of atrial fibrillation at the request of Dr. Saunders Revel. Their medical history includes coronary artery disease post CABG in 2005 with subsequent PCI to failed grafts, persistent atrial fibrillation, stroke, peripheral arterial disease, hypertension, hyperlipidemia, hypothyroidism, anemia, chronic pain.  The patient last saw Dr. Saunders Revel on September 21, 2022.  She was admitted in August 2023 with a altered mental status/unresponsiveness episode concerning for TIA. She also reports a fall where she struck her forehead and had a hematoma. She is with her daughter today in clinic.  She is very interested in avoiding long-term exposure anticoagulation given her frequent falls.   Presents for LAAO today.l Procedure reviewed.     Objective      Past Medical History:  Diagnosis Date   Abnormal CT scan, lumbar spine (05/11/2021) 05/17/2021    (05/11/2021) LUMBAR CT FINDINGS: Alignment: Lumbar levocurvature, apex L4. Mild lateral listhesis L4 on L5 of approximately 3 mm. Vertebrae: Remote appearing superior endplate deformities at T12 with 10% height loss and L1 with up to 20% height loss. Multilevel discogenic and facet degenerative changes. Mild bilateral SI joint arthrosis.  DISC LEVELS: T11-T12: Near complete disc height loss with de   Abnormal MRI, cervical spine (05/12/2021) 05/17/2021    (05/12/2021) CERVICAL MRI FINDINGS: Motion artifact is present. Posterior Fossa, vertebral arteries, paraspinal tissues: Left superior cerebellar infarct.   DISC LEVELS: C2-C3: Disc bulge with  endplate osteophytes. Uncovertebral and facet hypertrophy. C3-C4: Disc bulge with endplate osteophytes. Uncovertebral and facet hypertrophy. Mild canal stenosis. Marked foraminal stenosis. C4-C5: Disc bulge w   Allergy     Anemia     Anxiety     Arthritis     Back pain     Coronary artery disease     Depression     History of stroke 10/02/2020   Hypertension     Peripheral vascular disease ([place])     Personal history of nicotine dependence 10/02/2020   Prsnl hx of TIA (TIA), and cereb infrc w/o resid deficits 10/02/2020   Stroke [facility])     Thyroid disease             Past Surgical History:  Procedure Laterality Date   back injections       CARDIAC CATHETERIZATION       CATARACT EXTRACTION       CORONARY ANGIOPLASTY       CORONARY ARTERY BYPASS GRAFT        2005      Current Medications: Active Medications      Current Meds  Medication Sig   aspirin EC 81 MG tablet Take 81 mg by mouth daily. Swallow whole.   atorvastatin (LIPITOR) 40 MG tablet Take 1 tablet (40 mg total) by mouth at bedtime.   CALCIUM PO Take 1 tablet by mouth daily.   cephALEXin (KEFLEX) 500 MG capsule Take 1 capsule (500 mg total) by mouth 3 (three) times daily for 7 days.   Cholecalciferol (VITAMIN D-3) 25 MCG (1000  UT) CAPS Take by mouth.   cyanocobalamin (VITAMIN B12) 500 MCG tablet Take 500 mcg by mouth daily.   divalproex (DEPAKOTE) 500 MG DR tablet Take 1 tablet (500 mg total) by mouth at bedtime.   ELIQUIS 5 MG TABS tablet NEW PRESCRIPTION REQUEST: Eliquis 5 Mg TAKE ONE TABLET BY MOUTH TWICE DAILY (Patient taking differently: Take 5 mg by mouth 2 (two) times daily.)   furosemide (LASIX) 20 MG tablet Take 1 tablet (20 mg total) by mouth daily for 10 days.   HYDROcodone-acetaminophen (NORCO/VICODIN) 5-325 MG tablet Take 1 tablet by mouth 2 (two) times daily as needed for severe pain. Must last 30 days.   [START ON 11/22/2022] HYDROcodone-acetaminophen (NORCO/VICODIN) 5-325 MG tablet Take 1 tablet by mouth 2  (two) times daily as needed for severe pain. Must last 30 days.   [START ON 12/22/2022] HYDROcodone-acetaminophen (NORCO/VICODIN) 5-325 MG tablet Take 1 tablet by mouth 2 (two) times daily as needed for severe pain. Must last 30 days.   levothyroxine (SYNTHROID) 137 MCG tablet Take 137 mcg by mouth daily before breakfast.   magnesium oxide (MAG-OX) 400 (240 Mg) MG tablet Take 400 mg by mouth daily.   metoprolol succinate (TOPROL-XL) 100 MG 24 hr tablet TAKE 1/2 TABLET BY MOUTH AT BEDTIME   Multiple Vitamin (MULTIVITAMIN) tablet Take 1 tablet by mouth daily.   naloxone (NARCAN) nasal spray 4 mg/0.1 mL Place 1 spray into the nose as needed for up to 365 doses (for opioid-induced respiratory depresssion). In case of emergency (overdose), spray once into each nostril. If [name] response within 3 minutes, repeat application and call [phone].   traZODone (DESYREL) 100 MG tablet TAKE 1 TABLET BY MOUTH AT BEDTIME AS NEEDED FOR SLEEP        Allergies:   Patient has [name] known allergies.    Social History         Socioeconomic History   Marital status: Widowed      Spouse name: Not on file   Number of children: 3   Years of education: Not on file   Highest education level: Not on file  Occupational History   Not on file  Tobacco Use   Smoking status: Former      Years: 30.00      Types: Cigarettes      Quit date: 05/2020      Years since quitting: 2.4   Smokeless tobacco: Never   Tobacco comments:      Smoked about 1 pack per month  Vaping Use   Vaping Use: Never used  Substance and Sexual Activity   Alcohol use: Not Currently   Drug use: Yes      Types: Hydrocodone   Sexual activity: Not Currently  Other Topics Concern   Not on file  Social History Narrative    ** Merged History Encounter **         Social Determinants of Health        Financial Resource Strain: Low Risk  (08/14/2022)    Overall Financial Resource Strain (CARDIA)     Difficulty of Paying Living Expenses: Not hard at  all  Food Insecurity: [name] Food Insecurity (08/14/2022)    Hunger Vital Sign     Worried About Running Out of Food in the Last Year: Never true     [place] in the Last Year: Never true  Transportation Needs: [name] Transportation Needs (08/14/2022)    PRAPARE - Armed forces logistics/support/administrative officer (Medical):  [name]     Lack of Transportation (Non-Medical): [name]  Physical Activity: Insufficiently Active (08/14/2022)    Exercise Vital Sign     Days of Exercise per Week: 7 days     Minutes of Exercise per Session: 20 min  Stress: [name] Stress Concern Present (08/14/2022)    [place]     Feeling of Stress : Not at all  Social Connections: Not on file      Family History: The patient's family history includes Bipolar disorder in her daughter; Breast cancer in her mother; Heart attack in her father. There is [name] history of Diabetes Mellitus II.   ROS:   Please see the history of present illness.    All other systems reviewed and are negative.   EKGs/Labs/Other Studies Reviewed:     The following studies were reviewed today:   August 2023 echo shows EF 70%, RV normal, dilated LA       Recent Labs: 04/14/2022: Pro B Natriuretic peptide (BNP) 430.0 06/22/2022: Magnesium 1.6 09/28/2022: TSH 3.93 10/14/2022: ALT 26; BUN 18; Creatinine, Ser 0.90; Hemoglobin 11.9; Platelets 225; Potassium 4.5; Sodium 137  Recent Lipid Panel Labs (Brief)          Component Value Date/Time    CHOL 156 05/09/2022 0559    TRIG 125 05/09/2022 0559    HDL 48 05/09/2022 0559    CHOLHDL 3.3 05/09/2022 0559    VLDL 25 05/09/2022 0559    LDLCALC 83 05/09/2022 0559    LDLDIRECT 99.0 07/26/2021 1432        Physical Exam:     VS:  BP 150/90   Pulse 82   Ht 5' (1.524 m)   Wt 189 lb (85.7 kg)   SpO2 99%   BMI 36.91 kg/m         Wt Readings from Last 3 Encounters:  10/25/22 189 lb (85.7 kg)  10/23/22 193 lb (87.5 kg)  10/20/22 193 lb  3.2 oz (87.6 kg)      GEN:  Well nourished, well developed in [name] acute distress.  Elderly CARDIAC: Irregularly irregular, [name] murmurs, rubs, gallops PSYCHIATRIC:  Normal affect          Assessment ASSESSMENT:     1. Permanent atrial fibrillation ([place])   2. Essential hypertension     PLAN:     In order of problems listed above:   #Permanent atrial fibrillation #Falls   Presents today to discuss alternatives to anticoagulation given history of frequent falls with personal injury.   I have seen Reveca Eplin in the office today who is being considered for a Watchman left atrial appendage closure device. I believe they will benefit from this procedure given their history of atrial fibrillation, CHA2DS2-VASc score of 7 and unadjusted ischemic stroke rate of 11.2% per year. Unfortunately, the patient is not felt to be a long term anticoagulation candidate secondary to frequent falls with personal injury. The patient's chart has been reviewed and I feel that they would be a candidate for short term oral anticoagulation after Watchman implant.    It is my belief that after undergoing a LAA closure procedure, Chara Crutchfield will not need long term anticoagulation which eliminates anticoagulation side effects and major bleeding risk.    Procedural risks for the Watchman implant have been reviewed with the patient including a 0.5% risk of stroke, <1% risk of perforation and <1% risk of device embolization. Other risks include bleeding, vascular damage, tamponade, worsening  renal function, and death. The patient understands these risk and wishes to proceed.       The published clinical data on the safety and effectiveness of WATCHMAN include but are not limited to the following: - Holmes DR, Mechele Claude, Sick P et al. for the PROTECT AF Investigators. Percutaneous closure of the left atrial appendage versus warfarin therapy for prevention of stroke in patients with atrial fibrillation: a randomised  non-inferiority trial. Lancet 2009; 374: 534-42. Mechele Claude, Doshi SK, Abelardo Diesel D et al. on behalf of the PROTECT AF Investigators. Percutaneous Left Atrial Appendage Closure for Stroke Prophylaxis in Patients With Atrial Fibrillation 2.3-Year Follow-up of the PROTECT AF (Watchman Left Atrial Appendage System for Embolic Protection in Patients With Atrial Fibrillation) Trial. Circulation 2013; 127:720-729. - Alli O, Doshi S,  Kar S, Reddy VY, Sievert H et al. Quality of Life Assessment in the Randomized PROTECT AF (Percutaneous Closure of the Left Atrial Appendage Versus Warfarin Therapy for Prevention of Stroke in Patients With Atrial Fibrillation) Trial of Patients at Risk for Stroke With Nonvalvular Atrial Fibrillation. J Am Coll Cardiol 2013; N8865744. Vertell Limber DR, Tarri Abernethy, Price M, Dennison, Sievert H, Doshi S, Huber K, Reddy V. Prospective randomized evaluation of the Watchman left atrial appendage Device in patients with atrial fibrillation versus long-term warfarin therapy; the PREVAIL trial. Journal of the [employer] of Cardiology, Vol. 4, [name]. 1, 2014, 1-11. - Kar S, Doshi SK, Sadhu A, Horton R, Osorio J et al. Primary outcome evaluation of a next-generation left atrial appendage closure device: results from the PINNACLE FLX trial. Circulation 2021;143(18)1754-1762.      After today's visit with the patient which was dedicated solely for shared decision making visit regarding LAA closure device, the patient decided to proceed with the LAA appendage closure procedure scheduled to be done in the near future at [facility]. Prior to the procedure, I would like to obtain a gated CT scan of the chest with contrast timed for PV/LA visualization.        HAS-BLED score 4 Hypertension Yes  Abnormal renal and liver function (Dialysis, transplant, Cr >2.26 mg/dL /Cirrhosis or Bilirubin >2x Normal or AST/ALT/AP >3x Normal) [name]  Stroke Yes  Bleeding [name]  Labile INR (Unstable/high  INR) [name]  Elderly (>65) Yes  Drugs or alcohol (? 8 drinks/week, anti-plt or NSAID) Yes    CHA2DS2-VASc Score = 7  The patient's score is based upon: CHF History: 0 HTN History: 1 Diabetes History: 0 Stroke History: 2 Vascular Disease History: 1 Age Score: 2 Gender Score: 1      Presents for LAAO today. Procedure reviewed.       Signed, Hilton Cork. Quentin Ore, MD, [facility], [facility] 12/28/2022 Electrophysiology [facility]

## 2022-12-28 NOTE — Anesthesia Postprocedure Evaluation (Signed)
Anesthesia Post Note  Patient: Carmen [name]  Procedure(s) Performed: LEFT ATRIAL APPENDAGE OCCLUSION TRANSESOPHAGEAL ECHOCARDIOGRAM     Patient location during evaluation: PACU Anesthesia Type: General Level of consciousness: sedated Pain management: pain level controlled Vital Signs Assessment: post-procedure vital signs reviewed and stable Respiratory status: spontaneous breathing and respiratory function stable Cardiovascular status: stable Postop Assessment: no apparent nausea or vomiting Anesthetic complications: no  There were no known notable events for this encounter.  Last Vitals:  Vitals:   12/28/22 1330 12/28/22 1345  BP: (!) 170/98 (!) 159/100  Pulse: 90 89  Resp: (!) 27 10  Temp:    SpO2: 95% 95%    Last Pain:  Vitals:   12/28/22 1313  TempSrc: Temporal  PainSc: 0-No pain                 Khaleah Duer DANIEL

## 2022-12-28 NOTE — Progress Notes (Signed)
1925 patient alert and orientated wanting home medications patient had no medications ordered paged MD on call from 6-8 to get medications ordered. 2145 no medication ordered at this time patient upset

## 2022-12-28 NOTE — Anesthesia Procedure Notes (Signed)
Procedure Name: Intubation Date/Time: 12/28/2022 11:30 AM  Performed by: Mariea Clonts, CRNAPre-anesthesia Checklist: Patient identified, Emergency Drugs available, Suction available and Patient being monitored Patient Re-evaluated:Patient Re-evaluated prior to induction Oxygen Delivery Method: Circle System Utilized Preoxygenation: Pre-oxygenation with 100% oxygen Induction Type: IV induction Ventilation: Mask ventilation without difficulty Laryngoscope Size: Mac and 3 Grade View: Grade I Tube type: Oral Tube size: 7.0 mm Number of attempts: 1 Airway Equipment and Method: Stylet and Oral airway Placement Confirmation: ETT inserted through vocal cords under direct vision, positive ETCO2 and breath sounds checked- equal and bilateral Tube secured with: Tape Dental Injury: Teeth and Oropharynx as per pre-operative assessment

## 2022-12-28 NOTE — Discharge Summary (Addendum)
[facility]    Patient ID: Carmen [name],  MRN: [id number], DOB/AGE: [birth year] 81 y.o.  Admit date: 12/28/2022 Discharge date: 12/29/2022  Primary Care Physician: Eugenia Pancoast, [place]  Primary Cardiologist: Nelva Bush, MD  Electrophysiologist: Vickie Epley, MD  Primary Discharge Diagnosis:  Permanent Atrial Fibrillation Poor candidacy for long term anticoagulation due to frequent falls with personal injury  Procedures This Admission:  Transeptal Puncture Intra-procedural TEE which showed no LAA thrombus Left atrial appendage occlusive device placement on 3/28 by Dr. Quentin Ore.   Brief HPI: Carmen [name] is a 82 y.o. female with a history of Permanent Atrial Fibrillation. The patient was seen in the outpatient setting and felt to be a poor candidate for long-term anticoagulation due to  frequent falls with personal injury . The risks, benefits, and alternatives to left atrial appendage occlusive device placement device were reviewed with the patient and the patient wished to proceed. The patient was underwent pre procedure CT imaging which showed appropriate measurements for LAAO deployment.  Hospital Course:  The patient was admitted and underwent left atrial appendage occlusive device placement with Dr. Quentin Ore as above. Groin site had some oozing requiring a figure 8 stitch by Dr. Quentin Ore post op.   This was removed day of discharge without complication. The patient was examined and considered to be stable for discharge.  Wound care and restrictions were reviewed with the patient. The patient has been scheduled for post procedure follow up with Kathyrn Drown, NP in 1 month.    Medication plan will be to Continue Eliquis 5 mg BID x 45 days following implant. After 45 days, stop Eliquis and start Plavix 75 mb po daily to complete 6 months of post implant therapy. Plan for CT scan 60 days after implant to assess Watchman position.   Personally discussed importance  of not missing Eliquis with pts daughter, who will be sure she has enough tabs/refills to cover the 45 day period.   Physical Exam: Vitals:   12/28/22 2000 12/28/22 2337 12/29/22 0410 12/29/22 0749  BP: 122/79 (!) 130/90 132/86 (!) 144/89  Pulse: 83 98 91 95  Resp: 11 20 20 20   Temp:  98.1 F (36.7 C) 98 F (36.7 C) 98.2 F (36.8 C)  TempSrc:  Oral Oral Oral  SpO2: 96% 95% 97% 93%  Weight:      Height:        GEN- The patient is well appearing, alert and oriented x 3 today.   HEENT: normocephalic, atraumatic; sclera clear, conjunctiva pink; hearing intact; oropharynx clear; neck supple  Lungs- Clear to ausculation bilaterally, normal work of breathing.  No wheezes, rales, rhonchi Heart- Regular rate and rhythm, no murmurs, rubs or gallops  GI- soft, non-tender, non-distended, bowel sounds present  Extremities- no clubbing, cyanosis, or edema; DP/PT/radial pulses 2+ bilaterally, groin without hematoma/bruit MS- no significant deformity or atrophy Skin- warm and dry, no rash or lesion Psych- euthymic mood, full affect Neuro- strength and sensation are intact   Labs:   Lab Results  Component Value Date   WBC 7.2 12/11/2022   HGB 12.8 12/11/2022   HCT 38.7 12/11/2022   MCV 86 12/11/2022   PLT 221 12/11/2022   No results for input(s): "NA", "K", "CL", "CO2", "BUN", "CREATININE", "CALCIUM", "PROT", "BILITOT", "ALKPHOS", "ALT", "AST", "GLUCOSE" in the last 168 hours.  Invalid input(s): "LABALBU"   Discharge Medications:  Allergies as of 12/29/2022   No Known Allergies      Medication List  STOP taking these medications    aspirin EC 81 MG tablet       TAKE these medications    atorvastatin 40 MG tablet Commonly known as: LIPITOR Take 1 tablet (40 mg total) by mouth at bedtime.   CALCIUM PO Take 1 tablet by mouth daily.   cyanocobalamin 500 MCG tablet Commonly known as: VITAMIN B12 Take 500 mcg by mouth daily.   diphenhydrAMINE 25 MG tablet Commonly  known as: BENADRYL Take 25 mg by mouth at bedtime as needed for allergies.   divalproex 500 MG DR tablet Commonly known as: DEPAKOTE TAKE 1 TABLET BY MOUTH AT BEDTIME   Eliquis 5 MG Tabs tablet Generic drug: apixaban NEW PRESCRIPTION REQUEST: Eliquis 5 Mg TAKE ONE TABLET BY MOUTH TWICE DAILY What changed: See the new instructions.   furosemide 20 MG tablet Commonly known as: LASIX Take 1 tablet (20 mg total) by mouth daily as needed. What changed: when to take this   HYDROcodone-acetaminophen 5-325 MG tablet Commonly known as: NORCO/VICODIN Take 1 tablet by mouth 2 (two) times daily as needed for severe pain. Must last 30 days.   hydroxychloroquine 200 MG tablet Commonly known as: PLAQUENIL Take 200 mg by mouth 2 (two) times daily.   levothyroxine 137 MCG tablet Commonly known as: SYNTHROID Take 1 tablet (137 mcg total) by mouth daily before breakfast.   losartan 25 MG tablet Commonly known as: COZAAR Take half tablet once daily   magnesium oxide 400 (240 Mg) MG tablet Commonly known as: MAG-OX Take 400 mg by mouth daily.   metoprolol succinate 100 MG 24 hr tablet Commonly known as: TOPROL-XL TAKE 1/2 TABLET BY MOUTH AT BEDTIME   naloxone 4 MG/0.1ML Liqd nasal spray kit Commonly known as: NARCAN Place 1 spray into the nose as needed for up to 365 doses (for opioid-induced respiratory depresssion). In case of emergency (overdose), spray once into each nostril. If no response within 3 minutes, repeat application and call [phone].   Pepcid AC 10 MG tablet Generic drug: famotidine Take 10 mg by mouth daily as needed for heartburn or indigestion.   traZODone 100 MG tablet Commonly known as: DESYREL TAKE 1 TABLET BY MOUTH AT BEDTIME AS NEEDED FOR SLEEP   Vitamin D-3 25 MCG (1000 UT) Caps Take 1,000 Units by mouth daily.        Disposition:     Follow-up Information     Eugenia Pancoast, FNP Follow up.   Specialty: Family Medicine Contact information: [place] [address] [facility] Follow up.   Specialty: Cardiology Why: on 5/3 at 0925 for post watchman follow up Contact information: [address] [place] [id number] Aneth [place]keye [phone]                Duration of Discharge Encounter: Greater than 30 minutes including physician time.  Jacalyn Lefevre, PA-C  12/29/2022 8:37 AM

## 2022-12-29 ENCOUNTER — Encounter (HOSPITAL_COMMUNITY): Payer: Self-pay | Admitting: Cardiology

## 2022-12-29 NOTE — Progress Notes (Signed)
Ambulated along the hallway with walker, tolerated well.

## 2022-12-29 NOTE — Discharge Instructions (Signed)
[facility] Procedure, Care After  Procedure MD: Dr. Benson Norway Clinical Coordinator: Lenice Llamas, RN  This sheet gives you information about how to care for yourself after your procedure. Your health care provider may also give you more specific instructions. If you have problems or questions, contact your health care provider.  What can I expect after the procedure? After the procedure, it is common to have: Bruising around your puncture site. Tenderness around your puncture site. Tiredness (fatigue).  Medication instructions It is very important to continue to take your blood thinner as directed by your doctor after the Watchman procedure. Call your procedure doctor's office with question or concerns. Please follow your medication instructions on your discharge summary. Only take the medications listed on your discharge paperwork.  Follow up You will be seen in 1 month after your procedure You will have a repeat CT scan approximately 8 weeks after your procedure mark to check your device You will follow up the MD/APP who performed your procedure 6 months after your procedure The Watchman Clinical Coordinator will check in with you from time to time, including 1 and 2 years after your procedure.    Follow these instructions at home: Puncture site care  Follow instructions from your health care provider about how to take care of your puncture site. Make sure you: If present, leave stitches (sutures), skin glue, or adhesive strips in place.  If a large square bandage is present, this may be removed 24 hours after surgery.  Check your puncture site every day for signs of infection. Check for: Redness, swelling, or pain. Fluid or blood. If your puncture site starts to bleed, lie down on your back, apply firm pressure to the area, and contact your health care provider. Warmth. Pus or a bad smell. Driving Do not drive yourself home if you received sedation Do not drive for at  least 4 days after your procedure or however long your health care provider recommends. (Do not resume driving if you have previously been instructed not to drive for other health reasons.) Do not spend greater than 1 hour at a time in a car for the first 3 days. Stop and take a break with a 5 minute walk at least every hour.  Do not drive or use heavy machinery while taking prescription pain medicine.  Activity Avoid activities that take a lot of effort, including exercise, for at least 7 days after your procedure. For the first 3 days, avoid sitting for longer than one hour at a time.  Avoid alcoholic beverages, signing paperwork, or participating in legal proceedings for 24 hours after receiving sedation Do not lift anything that is heavier than 10 lb (4.5 kg) for one week.  No sexual activity for 1 week.  Return to your normal activities as told by your health care provider. Ask your health care provider what activities are safe for you. General instructions Take over-the-counter and prescription medicines only as told by your health care provider. Do not use any products that contain nicotine or tobacco, such as cigarettes and e-cigarettes. If you need help quitting, ask your health care provider. You may shower after 24 hours, but Do not take baths, swim, or use a hot tub for 1 week.  Do not drink alcohol for 24 hours after your procedure. Keep all follow-up visits as told by your health care provider. This is important. Dental Work: You will require antibiotics prior to any dental work, including cleanings, for 6 months after your [employer]  implantation to help protect you from infection. After 6 months, antibiotics are no longer required. Contact a health care provider if: You have redness, mild swelling, or pain around your puncture site. You have soreness in your throat or at your puncture site that does not improve after several days You have fluid or blood coming from your puncture  site that stops after applying firm pressure to the area. Your puncture site feels warm to the touch. You have pus or a bad smell coming from your puncture site. You have a fever. You have chest pain or discomfort that spreads to your neck, jaw, or arm. You are sweating a lot. You feel nauseous. You have a fast or irregular heartbeat. You have shortness of breath. You are dizzy or light-headed and feel the need to lie down. You have pain or numbness in the arm or leg closest to your puncture site. Get help right away if: Your puncture site suddenly swells. Your puncture site is bleeding and the bleeding does not stop after applying firm pressure to the area. These symptoms may represent a serious problem that is an emergency. Do not wait to see if the symptoms will go away. Get medical help right away. Call your local emergency services (911 in the [place] Do not drive yourself to the hospital. Summary After the procedure, it is normal to have bruising and tenderness at the puncture site in your groin, neck, or forearm. Check your puncture site every day for signs of infection. Get help right away if your puncture site is bleeding and the bleeding does not stop after applying firm pressure to the area. This is a medical emergency.  This information is not intended to replace advice given to you by your health care provider. Make sure you discuss any questions you have with your health care provider.

## 2022-12-29 NOTE — Progress Notes (Signed)
MD removed sutures from the right groin, minimal bleeding noted. Gauze dressing  with tegaderm applied. Continue to monitor.

## 2023-01-01 ENCOUNTER — Telehealth: Payer: Self-pay

## 2023-01-01 DIAGNOSIS — Z95818 Presence of other cardiac implants and grafts: Secondary | ICD-10-CM

## 2023-01-01 DIAGNOSIS — I4821 Permanent atrial fibrillation: Secondary | ICD-10-CM

## 2023-01-01 NOTE — Telephone Encounter (Signed)
Left message to call back  

## 2023-01-01 NOTE — Transitions of Care (Post Inpatient/ED Visit) (Signed)
   01/01/2023  Name: Carmen [name] MRN: [id number] DOB: 07-Dec-1940  Today's TOC FU Call Status: Today's TOC FU Call Status:: Successful TOC FU Call Competed TOC FU Call Complete Date: 01/01/23  Transition Care Management Follow-up Telephone Call Date of Discharge: 12/29/22 Discharge Facility: [facility]) Type of Discharge: Inpatient Admission Primary Inpatient Discharge Diagnosis:: Permanent Atrial Fibrillation How have you been since you were released from the hospital?: Better Any questions or concerns?: No  Items Reviewed: Did you receive and understand the discharge instructions provided?: Yes Medications obtained and verified?: Yes (Medications Reviewed) Any new allergies since your discharge?: No Dietary orders reviewed?: NA Do you have support at home?: Yes People in Home: child(ren), adult  Home Care and Equipment/Supplies: [place] Ordered?: NA Any new equipment or medical supplies ordered?: NA  Functional Questionnaire: Do you need assistance with bathing/showering or dressing?: No Do you need assistance with meal preparation?: No Do you need assistance with eating?: No Do you have difficulty maintaining continence: No Do you need assistance with getting out of bed/getting out of a chair/moving?: No Do you have difficulty managing or taking your medications?: No  Follow up appointments reviewed: PCP Follow-up appointment confirmed?: Yes Date of PCP follow-up appointment?: 01/09/23 Follow-up Provider: Eugenia Pancoast, [facility] Follow-up appointment confirmed?: Yes Date of Specialist follow-up appointment?: 02/02/23 Follow-Up Specialty Provider:: Cardiology Do you need transportation to your follow-up appointment?: No Do you understand care options if your condition(s) worsen?: Yes-patient verbalized understanding    Norton Blizzard, [place] ([place])  [place] [phone]

## 2023-01-02 NOTE — Telephone Encounter (Signed)
Left message to call back.  Will confirm 1 month visit and schedule 60 day CT (around 02/24/2023)/

## 2023-01-03 ENCOUNTER — Ambulatory Visit: Payer: Medicare Other | Admitting: Internal Medicine

## 2023-01-03 ENCOUNTER — Other Ambulatory Visit: Payer: Self-pay | Admitting: Family

## 2023-01-03 DIAGNOSIS — G4700 Insomnia, unspecified: Secondary | ICD-10-CM

## 2023-01-04 ENCOUNTER — Ambulatory Visit: Payer: Medicare Other | Admitting: Internal Medicine

## 2023-01-04 ENCOUNTER — Encounter: Payer: Self-pay | Admitting: Internal Medicine

## 2023-01-04 VITALS — BP 120/80 | HR 89 | Ht 60.0 in | Wt 181.2 lb

## 2023-01-04 DIAGNOSIS — I10 Essential (primary) hypertension: Secondary | ICD-10-CM

## 2023-01-04 DIAGNOSIS — I4821 Permanent atrial fibrillation: Secondary | ICD-10-CM

## 2023-01-04 DIAGNOSIS — E785 Hyperlipidemia, unspecified: Secondary | ICD-10-CM

## 2023-01-04 DIAGNOSIS — I25810 Atherosclerosis of coronary artery bypass graft(s) without angina pectoris: Secondary | ICD-10-CM | POA: Diagnosis not present

## 2023-01-04 MED ORDER — FUROSEMIDE 20 MG PO TABS: 20.0000 mg | ORAL_TABLET | Freq: Every day | ORAL | 5 refills | Status: DC | PRN

## 2023-01-04 NOTE — Patient Instructions (Addendum)
Medication Instructions:  Your Physician recommend you continue on your current medication as directed.    *If you need a refill on your cardiac medications before your next appointment, please call your pharmacy*   Lab Work: None ordered today   Testing/Procedures: None ordered today   Follow-Up: At [facility], you and your health needs are our priority.  As part of our continuing mission to provide you with exceptional heart care, we have created designated Provider Care Teams.  These Care Teams include your primary Cardiologist (physician) and Advanced Practice Providers (APPs -  Physician Assistants and Nurse Practitioners) who all work together to provide you with the care you need, when you need it.  We recommend signing up for the patient portal called "MyChart".  Sign up information is provided on this After Visit Summary.  MyChart is used to connect with patients for Virtual Visits (Telemedicine).  Patients are able to view lab/test results, encounter notes, upcoming appointments, etc.  Non-urgent messages can be sent to your provider as well.   To learn more about what you can do with MyChart, go to [email or web address]://[email or web address].    Your next appointment:   3 month(s)  Provider:   You may see Christopher End, MD or one of the following Advanced Practice Providers on your designated Care Team:   Christopher Berge, NP Ryan Dunn, PA-C Cadence Furth, PA-C Sheri Hammock, NP     

## 2023-01-04 NOTE — Progress Notes (Signed)
Follow-up Outpatient Visit Date: 01/04/2023  Primary Care Provider: Eugenia Pancoast, [place] [address]  Chief Complaint: Follow-up coronary artery disease and atrial fibrillation  HPI:  Carmen [name] is a 82 y.o. female with history of coronary artery disease status post CABG (2005, LIMA-LAD, sequential SVG-D-OM, and SVG-PDA) and subsequent PCI due to failure of multiple grafts (Taxus stents to proximal LAD, mid LCx, and RCA), persistent atrial fibrillation, stroke, PAD, hypertension, hyperlipidemia, hypothyroidism, anemia, chronic pain syndrome, and depression/anxiety, who presents for follow-up of her artery disease and atrial fibrillation.  I last saw her in 09/2022, at which time she was feeling fairly well but was still recovering from facial injuries after a fall.  She was referred to Dr. Quentin Ore for consideration of Watchman implantation in the setting of her atrial fibrillation and recurrent falls.  She was subsequently hospitalized in mid January with altered mental status and weakness workup showed stable chronic cerebrovascular disease but no acute stroke or other abnormality.  She left AMA before workup could be completed.  ZIO monitor was placed, which showed 100% atrial fibrillation burden without prolonged pauses.  She ultimately underwent left atrial appendage occlusion with Dr. Quentin Ore in late March.  Today, Carmen [name] reports that she is doing well.  She is recovering from her recent Watchman implantation notes bruising in her groins is improving.  She denies chest pain, shortness of breath, palpitations, and lightheadedness.  She continues to be off balance which has been present ever since her last stroke.  She has not fallen since her last visit with [place].  She remains on apixaban but hopes to come off this at her visit with Dr. Quentin Ore next month.  She is no longer living in assisted living but instead resides with her daughter.  She is scheduled to begin  physical therapy soon.  --------------------------------------------------------------------------------------------------  Cardiovascular History & Procedures: Cardiovascular Problems: Coronary artery disease status post CABG and PCI Paroxysmal atrial fibrillation Peripheral vascular disease Left internal carotid artery occlusion   Risk Factors: Known CAD and PAD, stroke, obesity, tobacco use, and age greater than 64   Cath/PCI: LHC/PCI (2006): Taxus DES to proximal LAD, mid LCx, and RCA x2.   CV Surgery: CABG (2005, LIMA to LAD, SVG to diagonal and OM, and SVG to PDA)   EP Procedures and Devices: Watchman (12/28/2022) Event monitor (10/27/2022): 100% atrial fibrillation burden with average heart rate 89 bpm (range 53-161 bpm).  Rare ventricular ectopy.  No prolonged pause.   Non-Invasive Evaluation(s): TTE (12/20/2022): Normal LV size with mild LVH.  LVEF 55-60%.  Mildly dilated RV with normal systolic function.  Severe left atrial enlargement.  Mild mitral regurgitation. TTE (05/09/2022): Normal LV size with mild LVH.  LVEF 70-75%.  Unable to assess regional wall motion and diastolic function.  Normal RV size and function.  Mild left atrial enlargement.  No significant valvular abnormality. 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  1-39% stenosis of the right internal carotid artery.  Antegrade vertebral artery flow bilaterally. TTE (12/14/2020): Normal LV size with moderate LVH.  LVEF 55-60%.  Indeterminate diastolic function.  Normal RV size and function.  Normal PA pressure.  Mild left atrial enlargement.  Mild mitral and tricuspid regurgitation.  Normal CVP. 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  Recent CV Pertinent Labs: Lab Results  Component Value Date   CHOL 156 05/09/2022   HDL 48 05/09/2022   LDLCALC 83 05/09/2022  LDLDIRECT 99.0 07/26/2021   TRIG 125 05/09/2022   CHOLHDL 3.3 05/09/2022   INR 1.3  (H) 10/14/2022   BNP 448.4 (H) 10/27/2022   K 4.5 12/11/2022   MG 1.6 06/22/2022   BUN 13 12/11/2022   CREATININE 0.98 12/11/2022    Past medical and surgical history were reviewed and updated in EPIC.  Current Meds  Medication Sig   atorvastatin (LIPITOR) 40 MG tablet Take 1 tablet (40 mg total) by mouth at bedtime.   CALCIUM PO Take 1 tablet by mouth daily.   Cholecalciferol (VITAMIN D-3) 25 MCG (1000 UT) CAPS Take 1,000 Units by mouth daily.   cyanocobalamin (VITAMIN B12) 500 MCG tablet Take 500 mcg by mouth daily.   diphenhydrAMINE (BENADRYL) 25 MG tablet Take 25 mg by mouth at bedtime as needed for allergies.   divalproex (DEPAKOTE) 500 MG DR tablet TAKE 1 TABLET BY MOUTH AT BEDTIME   ELIQUIS 5 MG TABS tablet NEW PRESCRIPTION REQUEST: Eliquis 5 Mg TAKE ONE TABLET BY MOUTH TWICE DAILY (Patient taking differently: Take 5 mg by mouth 2 (two) times daily.)   famotidine (PEPCID AC) 10 MG tablet Take 10 mg by mouth daily as needed for heartburn or indigestion.   furosemide (LASIX) 20 MG tablet Take 1 tablet (20 mg total) by mouth daily as needed. (Patient taking differently: Take 20 mg by mouth every other day.)   HYDROcodone-acetaminophen (NORCO/VICODIN) 5-325 MG tablet Take 1 tablet by mouth 2 (two) times daily as needed for severe pain. Must last 30 days.   hydroxychloroquine (PLAQUENIL) 200 MG tablet Take 200 mg by mouth 2 (two) times daily.   levothyroxine (SYNTHROID) 137 MCG tablet Take 1 tablet (137 mcg total) by mouth daily before breakfast.   losartan (COZAAR) 25 MG tablet Take half tablet once daily   magnesium oxide (MAG-OX) 400 (240 Mg) MG tablet Take 400 mg by mouth daily.   metoprolol succinate (TOPROL-XL) 100 MG 24 hr tablet TAKE 1/2 TABLET BY MOUTH AT BEDTIME   naloxone (NARCAN) nasal spray 4 mg/0.1 mL Place 1 spray into the nose as needed for up to 365 doses (for opioid-induced respiratory depresssion). In case of emergency (overdose), spray once into each nostril. If no  response within 3 minutes, repeat application and call [id number].   traZODone (DESYREL) 100 MG tablet TAKE 1 TABLET BY MOUTH AT BEDTIME AS NEEDED FOR SLEEP    Allergies: Patient has no known allergies.  Social History   Tobacco Use   Smoking status: Former    Years: 30    Types: Cigarettes    Quit date: 05/2020    Years since quitting: 2.6   Smokeless tobacco: Never   Tobacco comments:    Smoked about 1 pack per month  Vaping Use   Vaping Use: Never used  Substance Use Topics   Alcohol use: Not Currently   Drug use: Yes    Types: Hydrocodone    Family History  Problem Relation Age of Onset   Breast cancer Mother    Heart attack Father    Bipolar disorder Daughter    Diabetes Mellitus II Neg Hx     Review of Systems: A 12-system review of systems was performed and was negative except as noted in the HPI.  --------------------------------------------------------------------------------------------------  Physical Exam: BP 120/80 (BP Location: Left Arm, Patient Position: Sitting, Cuff Size: Normal)   Pulse 89   Ht 5' (1.524 m)   Wt 181 lb 4 oz (82.2 kg)   SpO2 98%   BMI  35.40 kg/m   General:  NAD. Neck: No JVD or HJR. Lungs: Clear to auscultation bilaterally without wheezes or crackles. Heart: Irregularly irregular rhythm without Abdomen: Soft, nontender, nondistended. Extremities: No lower extremity edema.  EKG: Atrial fibrillation with nonspecific ST/T changes.  No significant change from prior tracing on 12/10/2024.   Lab Results  Component Value Date   NA 139 12/11/2022   K 4.5 12/11/2022   CL 100 12/11/2022   CO2 30 (H) 12/11/2022   BUN 13 12/11/2022   CREATININE 0.98 12/11/2022   GLUCOSE 97 12/11/2022   ALT 26 10/14/2022    Lab Results  Component Value Date   CHOL 156 05/09/2022   HDL 48 05/09/2022   LDLCALC 83 05/09/2022   LDLDIRECT 99.0 07/26/2021   TRIG 125 05/09/2022   CHOLHDL 3.3 05/09/2022     --------------------------------------------------------------------------------------------------  ASSESSMENT AND PLAN: Permanent atrial fibrillation: Ventricular rate adequately controlled.  Ms. Elison is undergone successful left atrial appendage occlusion.  Hopefully, she can be taken off apixaban when she sees Dr. Quentin Ore in follow-up next month.  Given her history of CAD with CABG and multiple PCI's (including Taxus stents), I would favor transitioning from apixaban and aspirin to clopidogrel monotherapy.  Continue rate control with metoprolol succinate 50 mg nightly.  Coronary artery disease: No angina reported.  Nonspecific ST/T changes on EKG are unchanged from prior tracing.  Continue secondary prevention with atorvastatin and metoprolol as well as aspirin and apixaban for now.  Given history of recurrent falls and prior CABG and PCI's, I would favor de-escalation of antithrombotic/antiplatelet therapy from apixaban and aspirin to clopidogrel monotherapy when felt to be appropriate by Dr. Quentin Ore.  Hyperlipidemia: Continue atorvastatin 40 mg daily.  Hypertension: Blood pressure adequately controlled today.  No medication changes at this time.  Follow-up: Return to clinic in 3 months.  Nelva Bush, MD 01/04/2023 9:01 AM

## 2023-01-05 NOTE — Addendum Note (Signed)
Addended by: Gunnar Fusi A on: 01/05/2023 02:06 PM   Modules accepted: Orders

## 2023-01-09 ENCOUNTER — Encounter: Payer: Self-pay | Admitting: Family

## 2023-01-09 ENCOUNTER — Ambulatory Visit (INDEPENDENT_AMBULATORY_CARE_PROVIDER_SITE_OTHER): Payer: Medicare Other | Admitting: Family

## 2023-01-09 VITALS — BP 128/72 | HR 88 | Temp 98.2°F | Ht 60.0 in | Wt 179.2 lb

## 2023-01-09 DIAGNOSIS — R531 Weakness: Secondary | ICD-10-CM

## 2023-01-09 DIAGNOSIS — E039 Hypothyroidism, unspecified: Secondary | ICD-10-CM

## 2023-01-09 DIAGNOSIS — I4811 Longstanding persistent atrial fibrillation: Secondary | ICD-10-CM

## 2023-01-09 LAB — TSH: TSH: 2.09 u[IU]/mL (ref 0.35–5.50)

## 2023-01-09 NOTE — Assessment & Plan Note (Signed)
S/p watchman placement  Continue f/u with Dr. Okey Dupre, cardiology and Dr. Lalla Brothers

## 2023-01-09 NOTE — Assessment & Plan Note (Signed)
-   Continue with physical therapy.

## 2023-01-09 NOTE — Progress Notes (Signed)
Established Patient Hospital follow up Visit Subjective:   Patient ID: Carmen [name], female    DOB: 1941-09-04  Age: 82 y.o. MRN: [id number]  CC:  Chief Complaint  Patient presents with   Hospitalization Follow-up    Watchman implantation to get off of Eliquis.        HPI  Carmen [name] is a 82 y.o. female presenting on 01/09/2023 for  hospital follow up.    Hospital:  Admit: 12/28/22 Discharge: 12/29/22 Discharge dx: left atrial appendage occlusion , afib Discharge medications: apixaban 5 mg twice daily   Watchman device in situ, with afib.  Watchman implanted 3/28 Advised to stop eliquis after 45 days and start plavix 75 mg once daily to complete 6 months post implant therapy.  Will get repeat Ct after 60 days to assess watchman position.   Has since seen cardiologist, Dr. Okey Dupre for f/u CAD and afib EKG in office afib without sig change from 12/11/22 Seeing Dr. Lalla Brothers for watchman.  Ongoing rate control with metoprolol succ 50 mg nightly.  Was advised to f/u in three months  Last metabolic panel Lab Results  Component Value Date   GLUCOSE 97 12/11/2022   NA 139 12/11/2022   K 4.5 12/11/2022   CL 100 12/11/2022   CO2 30 (H) 12/11/2022   BUN 13 12/11/2022   CREATININE 0.98 12/11/2022   EGFR 58 (L) 12/11/2022   CALCIUM 9.9 12/11/2022   PROT 6.3 (L) 10/14/2022   ALBUMIN 3.8 10/14/2022   LABGLOB 2.5 11/22/2020   AGRATIO 1.7 11/22/2020   BILITOT 0.7 10/14/2022   ALKPHOS 39 10/14/2022   AST 30 10/14/2022   ALT 26 10/14/2022   ANIONGAP 10 10/14/2022   Tsh elevated one month ago.  Lab Results  Component Value Date   TSH 10.29 (H) 12/08/2022    Did see pulmonary for possible ILD however repeat CXR in office with pulmonary was reassuring. Only needs to now f/u prn   Hypothyroid.  Tsh was elevated last viist however she had only started taking her levothyroxine regularly two weeks prior.   No Known Allergies ----------------   Social history:  Relevant past  medical, surgical, family and social history reviewed and updated as indicated. Interim medical history since our last visit reviewed.  Allergies and medications reviewed and updated.  DATA REVIEWED: CHART IN EPIC    ROS: Negative unless specifically indicated above in HPI.    Current Outpatient Medications:    aspirin EC 81 MG tablet, Take 81 mg by mouth daily. Swallow whole., Disp: , Rfl:    atorvastatin (LIPITOR) 40 MG tablet, Take 1 tablet (40 mg total) by mouth at bedtime., Disp: 90 tablet, Rfl: 0   CALCIUM PO, Take 1 tablet by mouth daily., Disp: , Rfl:    Cholecalciferol (VITAMIN D-3) 25 MCG (1000 UT) CAPS, Take 1,000 Units by mouth daily., Disp: , Rfl:    cyanocobalamin (VITAMIN B12) 500 MCG tablet, Take 500 mcg by mouth daily., Disp: , Rfl:    diphenhydrAMINE (BENADRYL) 25 MG tablet, Take 25 mg by mouth at bedtime as needed for allergies., Disp: , Rfl:    divalproex (DEPAKOTE) 500 MG DR tablet, TAKE 1 TABLET BY MOUTH AT BEDTIME, Disp: 90 tablet, Rfl: 0   ELIQUIS 5 MG TABS tablet, NEW PRESCRIPTION REQUEST: Eliquis 5 Mg TAKE ONE TABLET BY MOUTH TWICE DAILY (Patient taking differently: Take 5 mg by mouth 2 (two) times daily.), Disp: 180 tablet, Rfl: 3   famotidine (PEPCID AC) 10 MG tablet, Take  10 mg by mouth daily as needed for heartburn or indigestion., Disp: , Rfl:    furosemide (LASIX) 20 MG tablet, Take 1 tablet (20 mg total) by mouth daily as needed., Disp: 30 tablet, Rfl: 5   HYDROcodone-acetaminophen (NORCO/VICODIN) 5-325 MG tablet, Take 1 tablet by mouth 2 (two) times daily as needed for severe pain. Must last 30 days., Disp: 60 tablet, Rfl: 0   hydroxychloroquine (PLAQUENIL) 200 MG tablet, Take 200 mg by mouth 2 (two) times daily., Disp: , Rfl:    levothyroxine (SYNTHROID) 137 MCG tablet, Take 1 tablet (137 mcg total) by mouth daily before breakfast., Disp: 90 tablet, Rfl: 0   losartan (COZAAR) 25 MG tablet, Take half tablet once daily, Disp: 45 tablet, Rfl: 3   magnesium  oxide (MAG-OX) 400 (240 Mg) MG tablet, Take 400 mg by mouth daily., Disp: , Rfl:    metoprolol succinate (TOPROL-XL) 100 MG 24 hr tablet, TAKE 1/2 TABLET BY MOUTH AT BEDTIME, Disp: 45 tablet, Rfl: 0   naloxone (NARCAN) nasal spray 4 mg/0.1 mL, Place 1 spray into the nose as needed for up to 365 doses (for opioid-induced respiratory depresssion). In case of emergency (overdose), spray once into each nostril. If no response within 3 minutes, repeat application and call 911., Disp: 1 each, Rfl: 0   traZODone (DESYREL) 100 MG tablet, TAKE 1 TABLET BY MOUTH AT BEDTIME AS NEEDED FOR SLEEP, Disp: 90 tablet, Rfl: 0      Objective:    BP 128/72   Pulse 88   Temp 98.2 F (36.8 C) (Temporal)   Ht 5' (1.524 m)   Wt 179 lb 3.2 oz (81.3 kg)   SpO2 98%   BMI 35.00 kg/m   Wt Readings from Last 3 Encounters:  01/09/23 179 lb 3.2 oz (81.3 kg)  01/04/23 181 lb 4 oz (82.2 kg)  12/28/22 174 lb (78.9 kg)    Physical Exam Vitals reviewed.  Constitutional:      General: She is not in acute distress.    Appearance: Normal appearance. She is normal weight. She is not ill-appearing, toxic-appearing or diaphoretic.  HENT:     Head: Normocephalic.  Cardiovascular:     Rate and Rhythm: Normal rate and regular rhythm.  Pulmonary:     Effort: Pulmonary effort is normal.     Breath sounds: Normal breath sounds.  Musculoskeletal:        General: Normal range of motion.  Neurological:     General: No focal deficit present.     Mental Status: She is alert and oriented to person, place, and time. Mental status is at baseline.  Psychiatric:        Mood and Affect: Mood normal.        Behavior: Behavior normal.        Thought Content: Thought content normal.        Judgment: Judgment normal.         Assessment & Plan:  Acquired hypothyroidism Assessment & Plan: Repeat tsh today to see if labs in better range. Continue current dose levothyroxine  Orders: -     TSH  Longstanding persistent atrial  fibrillation Assessment & Plan: S/p watchman placement  Continue f/u with Dr. Okey Dupre, cardiology and Dr. Lalla Brothers   Right sided weakness Assessment & Plan: Continue with physical therapy       Return in about 6 months (around 07/11/2023) for f/u thyroid .  Mort Sawyers, FNP

## 2023-01-09 NOTE — Patient Instructions (Signed)
  Stop by the lab prior to leaving today. I will notify you of your results once received.    Regards,   Camani Sesay FNP-C  

## 2023-01-09 NOTE — Assessment & Plan Note (Signed)
Repeat tsh today to see if labs in better range. Continue current dose levothyroxine

## 2023-01-10 ENCOUNTER — Other Ambulatory Visit: Payer: Self-pay | Admitting: Family

## 2023-01-10 DIAGNOSIS — G4700 Insomnia, unspecified: Secondary | ICD-10-CM

## 2023-01-11 DIAGNOSIS — R2990 Unspecified symptoms and signs involving the nervous system: Secondary | ICD-10-CM | POA: Diagnosis not present

## 2023-01-11 DIAGNOSIS — Z9181 History of falling: Secondary | ICD-10-CM | POA: Diagnosis not present

## 2023-01-11 NOTE — Progress Notes (Unsigned)
PROVIDER NOTE: Information contained herein reflects review and annotations entered in association with encounter. Interpretation of such information and data should be left to medically-trained personnel. Information provided to patient can be located elsewhere in the medical record under "Patient Instructions". Document created using STT-dictation technology, any transcriptional errors that may result from process are unintentional.    Patient: Carmen [name]  Service Category: E/M  Provider: Oswaldo Done, MD  DOB: Jun 08, 1941  DOS: 01/15/2023  Referring Provider: Mort Sawyers, FNP  MRN: [id number]  Specialty: Interventional Pain Management  PCP: Mort Sawyers, FNP  Type: Established Patient  Setting: Ambulatory outpatient    Location: Office  Delivery: Face-to-face     HPI  [name], a 82 y.o. year old female, is here today because of her No primary diagnosis found.. [name]'s primary complain today is No chief complaint on file.  Pertinent problems: [name] has Peripheral vascular disease; Other chronic pain; Chronic low back pain (1ry area of Pain) (Bilateral) (R>L) w/o sciatica; Dorsalgia, unspecified; Chronic pain syndrome; Chronic neck pain (2ry area of Pain) (Bilateral) (L>R); Chronic lower extremity pain (3ry area of Pain) (Bilateral) (R>L); Chronic hand pain (Left); Chronic hand pain (Right); Lumbar compression fracture (Old) (Multilevel), sequela; Chronic cervical radiculopathy (Bilateral); DDD (degenerative disc disease), cervical; Foraminal stenosis of cervical region; Lumbar facet syndrome (Bilateral); Lumbosacral facet hypertrophy (Multilevel) (Bilateral); Lumbar facet arthropathy (Multilevel) (Bilateral); 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HCC); Cervical facet syndrome (Bilateral); Cervical facet hypertrophy (Multilevel) (Bilateral); Spondylosis without myelopathy or radiculopathy, cervical region; Spondylosis without myelopathy or radiculopathy, lumbosacral  region; Other intervertebral disc degeneration, lumbar region; DDD (degenerative disc disease), lumbosacral; Levoscoliosis of lumbar spine; Lumbosacral foraminal stenosis (Multilevel) (Bilateral); Degenerative lateral spondylolisthesis of L4/L5 (61mm); Baastrup's syndrome; Unsteady gait when walking; Chronic hip pain (Right); Greater trochanteric bursitis (Right); Chronic lower extremity pain (Right); and Osteoarthritis of hip (Right) on their pertinent problem list. Pain Assessment: Severity of   is reported as a  /10. Location:    / . Onset:  . Quality:  . Timing:  . Modifying factor(s):  Marland Kitchen Vitals:  vitals were not taken for this visit.  BMI: Estimated body mass index is 35 kg/m as calculated from the following:   Height as of 01/09/23: 5' (1.524 m).   Weight as of 01/09/23: 179 lb 3.2 oz (81.3 kg). Last encounter: 10/23/2022. Last procedure: 08/22/2022.  Reason for encounter: medication management. ***  RTCB: 04/21/2023   Pharmacotherapy Assessment  Analgesic: Hydrocodone/APAP 5/325, 1 tab p.o. BID (10 mg/day of hydrocodone) (10 MME) (last filled on 05/02/2021) MME/day: 10 mg/day   Monitoring: [place] PMP: PDMP reviewed during this encounter.       Pharmacotherapy: No side-effects or adverse reactions reported. Compliance: No problems identified. Effectiveness: Clinically acceptable.  No notes on file  No results found for: "CBDTHCR" No results found for: "D8THCCBX" No results found for: "D9THCCBX"  UDS:  Summary  Date Value Ref Range Status  04/18/2022 Note  Final    Comment:    ==================================================================== [employer] (MW) ==================================================================== Test                             Result       Flag       Units  Drug Present and Declared for Prescription Verification   7-aminoclonazepam              111          EXPECTED  ng/mg creat    7-aminoclonazepam is an expected metabolite of clonazepam.  Source of    clonazepam is a scheduled prescription medication.    Hydrocodone                    1142         EXPECTED   ng/mg creat   Hydromorphone                  151          EXPECTED   ng/mg creat   Dihydrocodeine                 126          EXPECTED   ng/mg creat   Norhydrocodone                 1954         EXPECTED   ng/mg creat    Sources of hydrocodone include scheduled prescription medications.    Hydromorphone, dihydrocodeine and norhydrocodone are expected    metabolites of hydrocodone. Hydromorphone and dihydrocodeine are    also available as scheduled prescription medications.  ==================================================================== Test                      Result    Flag   Units      Ref Range   Creatinine              57               mg/dL      >=16>=20 ==================================================================== Declared Medications:  The flagging and interpretation on this report are based on the  following declared medications.  Unexpected results may arise from  inaccuracies in the declared medications.   **Note: The testing scope of this panel includes these medications:   Clonazepam (Klonopin)  Hydrocodone (Norco)   **Note: The testing scope of this panel does not include the  following reported medications:   Acetaminophen (Norco)  Apixaban (Eliquis)  Aspirin  Atorvastatin (Lipitor)  Calcium  Cetirizine (Zyrtec)  Hydrochlorothiazide  Levothyroxine (Synthroid)  Metoprolol (Toprol)  Trazodone (Desyrel) ==================================================================== For clinical consultation, please call [phone]. ====================================================================       ROS  Constitutional: Denies any fever or chills Gastrointestinal: No reported hemesis, hematochezia, vomiting, or acute GI distress Musculoskeletal: Denies any acute onset joint swelling, redness, loss of ROM, or  weakness Neurological: No reported episodes of acute onset apraxia, aphasia, dysarthria, agnosia, amnesia, paralysis, loss of coordination, or loss of consciousness  Medication Review  Calcium, HYDROcodone-acetaminophen, Vitamin D-3, apixaban, aspirin EC, atorvastatin, cyanocobalamin, diphenhydrAMINE, divalproex, famotidine, furosemide, hydroxychloroquine, levothyroxine, losartan, magnesium oxide, metoprolol succinate, naloxone, and traZODone  History Review  Allergy: Ms. Thomure has No Known Allergies. Drug: Ms. Hamric  reports current drug use. Drug: Hydrocodone. Alcohol:  reports that she does not currently use alcohol. Tobacco:  reports that she quit smoking about 2 years ago. Her smoking use included cigarettes. She has never used smokeless tobacco. Social: Ms. Robin  reports that she quit smoking about 2 years ago. Her smoking use included cigarettes. She has never used smokeless tobacco. She reports that she does not currently use alcohol. She reports current drug use. Drug: Hydrocodone. 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 Surgical: Ms. Cardy  has a past surgical history that includes Cardiac catheterization; Coronary angioplasty; Coronary artery bypass graft; back injections; Cataract extraction; LEFT ATRIAL APPENDAGE OCCLUSION (N/A, 12/28/2022); and TEE without cardioversion (N/A, 12/28/2022). Family: family history includes Bipolar disorder in her daughter; Breast cancer in her mother; Heart attack in her father.  Laboratory Chemistry Profile   Renal Lab Results   Component Value Date   BUN 13 12/11/2022   CREATININE 0.98 12/11/2022   BCR 13 12/11/2022   GFR 67.85 10/18/2021   GFRAA 73 11/22/2020   GFRNONAA >60 10/14/2022    Hepatic Lab Results  Component Value Date   AST 30 10/14/2022   ALT 26 10/14/2022   ALBUMIN 3.8 10/14/2022   ALKPHOS 39 10/14/2022   AMMONIA 19 10/06/2020    Electrolytes Lab Results  Component Value Date   NA 139 12/11/2022   K 4.5 12/11/2022   CL 100 12/11/2022   CALCIUM 9.9 12/11/2022   MG 1.6 06/22/2022    Bone Lab Results  Component Value Date   25OHVITD1 12 (L) 03/14/2021   25OHVITD2 <1.0 03/14/2021   25OHVITD3 12 03/14/2021    Inflammation (CRP: Acute Phase) (ESR: Chronic Phase) Lab Results  Component Value Date   CRP 0.8 03/14/2021   ESRSEDRATE 3 08/01/2022         Note: Above Lab results reviewed.  Recent Imaging Review  EP STUDY CONCLUSIONS:  1.Successful implantation of a WATCHMAN left atrial appendage occlusive  device    2. TEE demonstrating no LAA thrombus 3. No early apparent complications.   Post Implant Anticoagulation Strategy: Continue Eliquis 5mg  PO BID x 45 days following implant. After 45 days,  stop Eliquis and start Plavix 75mg  PO daily to complete 6 months of post  implant therapy. Plan for CT scan 60 days after implant to assess Watchman  position. Note: Reviewed        Physical Exam  General appearance: Well nourished, well developed, and well hydrated. In no apparent acute distress Mental status: Alert, oriented x 3 (person, place, & time)       Respiratory: No evidence of acute respiratory distress Eyes: PERLA Vitals: There were no vitals taken for this visit. BMI: Estimated body mass index is 35 kg/m as calculated from the following:   Height as of 01/09/23: 5' (1.524 m).   Weight as of 01/09/23: 179 lb 3.2 oz (81.3 kg). Ideal: Ideal body weight: 45.5 kg (100 lb 4.9 oz) Adjusted ideal body weight: 59.8 kg (131 lb 13.9 oz)  Assessment   Diagnosis Status  1.  Chronic pain syndrome   2. Pharmacologic therapy   3. Chronic neck pain (2ry area of Pain) (Bilateral) (L>R)   4. Encounter for medication management   5. Encounter for chronic pain management   6. Chronic use of opiate for therapeutic purpose   7. Lumbar facet joint syndrome (Bilateral)   8. Chronic lower extremity pain (3ry area of Pain) (Bilateral) (L>R)   9. Chronic low back pain (1ry area of Pain) (Bilateral) (L>R) w/o sciatica    Controlled Controlled Controlled   Updated Problems: No problems updated.  Plan of Care  Problem-specific:  No problem-specific Assessment & Plan notes found for this encounter.  Ms. Khianna Bashor has a current medication list which includes the following long-term medication(s): atorvastatin, calcium, diphenhydramine, divalproex, eliquis, famotidine, furosemide, hydrocodone-acetaminophen, levothyroxine, losartan, metoprolol succinate, and  trazodone.  Pharmacotherapy (Medications Ordered): No orders of the defined types were placed in this encounter.  Orders:  No orders of the defined types were placed in this encounter.  Follow-up plan:   No follow-ups on file.      Interventional Therapies  Risk  Complexity Considerations:   ELIQUIS + ASA Anticoagulation (Stop: 3 days  Restart: 6 hours)   Planned  Pending:      Under consideration:   Diagnostic/therapeutic cervical ESI  Therapeutic left lumbar facet RFA #1  Diagnostic/therapeutic right L4-5 LESI #1  Diagnostic/therapeutic right L4 and L5 TFESI #1    Completed:   Diagnostic/Therapeutic right IA Hip & TBI inj. x2 (08/22/2022) (100/100/100/90-95)  Therapeutic right lumbar facet RFA x1 (06/13/2022) (100/100/100/90-100)  Diagnostic right lumbar facet MBB x3 (01/26/2022) (100/100/90/90)  Diagnostic left lumbar facet MBB x3 (01/26/2022) (100/100/90/90)  Diagnostic left cervical facet MBB x1 (06/28/2021) (25/25/0/0)    Therapeutic  Palliative (PRN) options:   Therapeutic lumbar facet MBB         Recent Visits Date Type Provider Dept  10/23/22 Office Visit Delano Metz, MD [facility]  Showing recent visits within past 90 days and meeting all other requirements Future Appointments Date Type Provider Dept  01/15/23 Appointment Delano Metz, MD [facility]  Showing future appointments within next 90 days and meeting all other requirements  I discussed the assessment and treatment plan with the patient. The patient was provided an opportunity to ask questions and all were answered. The patient agreed with the plan and demonstrated an understanding of the instructions.  Patient advised to call back or seek an in-person evaluation if the symptoms or condition worsens.  Duration of encounter: *** minutes.  Total time on encounter, as per AMA guidelines included both the face-to-face and non-face-to-face time personally spent by the physician and/or other qualified health care professional(s) on the day of the encounter (includes time in activities that require the physician or other qualified health care professional and does not include time in activities normally performed by clinical staff). Physician's time may include the following activities when performed: Preparing to see the patient (e.g., pre-charting review of records, searching for previously ordered imaging, lab work, and nerve conduction tests) Review of prior analgesic pharmacotherapies. Reviewing PMP Interpreting ordered tests (e.g., lab work, imaging, nerve conduction tests) Performing post-procedure evaluations, including interpretation of diagnostic procedures Obtaining and/or reviewing separately obtained history Performing a medically appropriate examination and/or evaluation Counseling and educating the patient/family/caregiver Ordering medications, tests, or procedures Referring and communicating with other health care professionals (when not separately reported) Documenting  clinical information in the electronic or other health record Independently interpreting results (not separately reported) and communicating results to the patient/ family/caregiver Care coordination (not separately reported)  Note by: Oswaldo Done, MD Date: 01/15/2023; Time: 10:15 AM

## 2023-01-11 NOTE — Telephone Encounter (Signed)
traZODone (DESYREL) 100 MG tablet   LV- 01/09/23 [place]- Not Scheduled LR- 01/03/23 ( 90 tab/ no refills)

## 2023-01-12 ENCOUNTER — Other Ambulatory Visit (HOSPITAL_COMMUNITY): Payer: Self-pay

## 2023-01-12 ENCOUNTER — Other Ambulatory Visit: Payer: Self-pay

## 2023-01-12 MED ORDER — TRAZODONE HCL 100 MG PO TABS
100.0000 mg | ORAL_TABLET | Freq: Every evening | ORAL | 0 refills | Status: DC | PRN
  Filled 2023-01-12: qty 90, 90d supply, fill #0

## 2023-01-15 ENCOUNTER — Ambulatory Visit: Payer: Medicare Other | Attending: Pain Medicine | Admitting: Pain Medicine

## 2023-01-15 ENCOUNTER — Other Ambulatory Visit: Payer: Self-pay | Admitting: Family

## 2023-01-15 ENCOUNTER — Other Ambulatory Visit: Payer: Self-pay | Admitting: Internal Medicine

## 2023-01-15 ENCOUNTER — Encounter: Payer: Self-pay | Admitting: Pain Medicine

## 2023-01-15 VITALS — BP 130/94 | HR 84 | Temp 98.1°F | Resp 18 | Ht 60.0 in | Wt 179.0 lb

## 2023-01-15 DIAGNOSIS — Z79899 Other long term (current) drug therapy: Secondary | ICD-10-CM

## 2023-01-15 DIAGNOSIS — M79604 Pain in right leg: Secondary | ICD-10-CM | POA: Diagnosis not present

## 2023-01-15 DIAGNOSIS — M542 Cervicalgia: Secondary | ICD-10-CM

## 2023-01-15 DIAGNOSIS — E785 Hyperlipidemia, unspecified: Secondary | ICD-10-CM

## 2023-01-15 DIAGNOSIS — Z79891 Long term (current) use of opiate analgesic: Secondary | ICD-10-CM | POA: Diagnosis not present

## 2023-01-15 DIAGNOSIS — G894 Chronic pain syndrome: Secondary | ICD-10-CM

## 2023-01-15 DIAGNOSIS — G8929 Other chronic pain: Secondary | ICD-10-CM | POA: Diagnosis not present

## 2023-01-15 DIAGNOSIS — M545 Low back pain, unspecified: Secondary | ICD-10-CM | POA: Diagnosis not present

## 2023-01-15 DIAGNOSIS — M79605 Pain in left leg: Secondary | ICD-10-CM | POA: Diagnosis not present

## 2023-01-15 DIAGNOSIS — M47816 Spondylosis without myelopathy or radiculopathy, lumbar region: Secondary | ICD-10-CM | POA: Diagnosis not present

## 2023-01-15 MED ORDER — HYDROCODONE-ACETAMINOPHEN 5-325 MG PO TABS: 1.0000 | ORAL_TABLET | Freq: Two times a day (BID) | ORAL | 0 refills | Status: DC | PRN

## 2023-01-15 NOTE — Patient Instructions (Signed)
____________________________________________________________________________________________  Opioid Pain Medication Update  To: All patients taking opioid pain medications. (I.e.: hydrocodone, hydromorphone, oxycodone, oxymorphone, morphine, codeine, methadone, tapentadol, tramadol, buprenorphine, fentanyl, etc.)  Re: Updated review of side effects and adverse reactions of opioid analgesics, as well as new information about long term effects of this class of medications.  Direct risks of long-term opioid therapy are not limited to opioid addiction and overdose. Potential medical risks include serious fractures, breathing problems during sleep, hyperalgesia, immunosuppression, chronic constipation, bowel obstruction, myocardial infarction, and tooth decay secondary to xerostomia.  Unpredictable adverse effects that can occur even if you take your medication correctly: Cognitive impairment, respiratory depression, and death. Most people think that if they take their medication "correctly", and "as instructed", that they will be safe. Nothing could be farther from the truth. In reality, a significant amount of recorded deaths associated with the use of opioids has occurred in individuals that had taken the medication for a long time, and were taking their medication correctly. The following are examples of how this can happen: Patient taking his/her medication for a long time, as instructed, without any side effects, is given a certain antibiotic or another unrelated medication, which in turn triggers a "Drug-to-drug interaction" leading to disorientation, cognitive impairment, impaired reflexes, respiratory depression or an untoward event leading to serious bodily harm or injury, including death.  Patient taking his/her medication for a long time, as instructed, without any side effects, develops an acute impairment of liver and/or kidney function. This will lead to a rapid inability of the body to  breakdown and eliminate their pain medication, which will result in effects similar to an "overdose", but with the same medicine and dose that they had always taken. This again may lead to disorientation, cognitive impairment, impaired reflexes, respiratory depression or an untoward event leading to serious bodily harm or injury, including death.  A similar problem will occur with patients as they grow older and their liver and kidney function begins to decrease as part of the aging process.  Background information: Historically, the original case for using long-term opioid therapy to treat chronic noncancer pain was based on safety assumptions that subsequent experience has called into question. In 1996, the American Pain Society and the American Academy of Pain Medicine issued a consensus statement supporting long-term opioid therapy. This statement acknowledged the dangers of opioid prescribing but concluded that the risk for addiction was low; respiratory depression induced by opioids was short-lived, occurred mainly in opioid-naive patients, and was antagonized by pain; tolerance was not a common problem; and efforts to control diversion should not constrain opioid prescribing. This has now proven to be wrong. Experience regarding the risks for opioid addiction, misuse, and overdose in community practice has failed to support these assumptions.  According to the Centers for Disease Control and Prevention, fatal overdoses involving opioid analgesics have increased sharply over the past decade. Currently, more than 96,700 people die from drug overdoses every year. Opioids are a factor in 7 out of every 10 overdose deaths. Deaths from drug overdose have surpassed motor vehicle accidents as the leading cause of death for individuals between the ages of 35 and 54.  Clinical data suggest that neuroendocrine dysfunction may be very common in both men and women, potentially causing hypogonadism, erectile  dysfunction, infertility, decreased libido, osteoporosis, and depression. Recent studies linked higher opioid dose to increased opioid-related mortality. Controlled observational studies reported that long-term opioid therapy may be associated with increased risk for cardiovascular events. Subsequent meta-analysis concluded   that the safety of long-term opioid therapy in elderly patients has not been proven.   Side Effects and adverse reactions: Common side effects: Drowsiness (sedation). Dizziness. Nausea and vomiting. Constipation. Physical dependence -- Dependence often manifests with withdrawal symptoms when opioids are discontinued or decreased. Tolerance -- As you take repeated doses of opioids, you require increased medication to experience the same effect of pain relief. Respiratory depression -- This can occur in healthy people, especially with higher doses. However, people with COPD, asthma or other lung conditions may be even more susceptible to fatal respiratory impairment.  Uncommon side effects: An increased sensitivity to feeling pain and extreme response to pain (hyperalgesia). Chronic use of opioids can lead to this. Delayed gastric emptying (the process by which the contents of your stomach are moved into your small intestine). Muscle rigidity. Immune system and hormonal dysfunction. Quick, involuntary muscle jerks (myoclonus). Arrhythmia. Itchy skin (pruritus). Dry mouth (xerostomia).  Long-term side effects: Chronic constipation. Sleep-disordered breathing (SDB). Increased risk of bone fractures. Hypothalamic-pituitary-adrenal dysregulation. Increased risk of overdose.  RISKS: Fractures and Falls:  Opioids increase the risk and incidence of falls. This is of particular importance in elderly patients.  Endocrine System:  Long-term administration is associated with endocrine abnormalities (endocrinopathies). (Also known as Opioid-induced Endocrinopathy) Influences  on both the hypothalamic-pituitary-adrenal axis?and the hypothalamic-pituitary-gonadal axis have been demonstrated with consequent hypogonadism and adrenal insufficiency in both sexes. Hypogonadism and decreased levels of dehydroepiandrosterone sulfate have been reported in men and women. Endocrine effects include: Amenorrhoea in women (abnormal absence of menstruation) Reduced libido in both sexes Decreased sexual function Erectile dysfunction in men Hypogonadisms (decreased testicular function with shrinkage of testicles) Infertility Depression and fatigue Loss of muscle mass Anxiety Depression Immune suppression Hyperalgesia Weight gain Anemia Osteoporosis Patients (particularly women of childbearing age) should avoid opioids. There is insufficient evidence to recommend routine monitoring of asymptomatic patients taking opioids in the long-term for hormonal deficiencies.  Immune System: Human studies have demonstrated that opioids have an immunomodulating effect. These effects are mediated via opioid receptors both on immune effector cells and in the central nervous system. Opioids have been demonstrated to have adverse effects on antimicrobial response and anti-tumour surveillance. Buprenorphine has been demonstrated to have no impact on immune function.  Opioid Induced Hyperalgesia: Human studies have demonstrated that prolonged use of opioids can lead to a state of abnormal pain sensitivity, sometimes called opioid induced hyperalgesia (OIH). Opioid induced hyperalgesia is not usually seen in the absence of tolerance to opioid analgesia. Clinically, hyperalgesia may be diagnosed if the patient on long-term opioid therapy presents with increased pain. This might be qualitatively and anatomically distinct from pain related to disease progression or to breakthrough pain resulting from development of opioid tolerance. Pain associated with hyperalgesia tends to be more diffuse than the  pre-existing pain and less defined in quality. Management of opioid induced hyperalgesia requires opioid dose reduction.  Cancer: Chronic opioid therapy has been associated with an increased risk of cancer among noncancer patients with chronic pain. This association was more evident in chronic strong opioid users. Chronic opioid consumption causes significant pathological changes in the small intestine and colon. Epidemiological studies have found that there is a link between opium dependence and initiation of gastrointestinal cancers. Cancer is the second leading cause of death after cardiovascular disease. Chronic use of opioids can cause multiple conditions such as GERD, immunosuppression and renal damage as well as carcinogenic effects, which are associated with the incidence of cancers.   Mortality: Long-term opioid use   has been associated with increased mortality among patients with chronic non-cancer pain (CNCP).  Prescription of long-acting opioids for chronic noncancer pain was associated with a significantly increased risk of all-cause mortality, including deaths from causes other than overdose.  Reference: Von Korff M, Kolodny A, Deyo RA, Chou R. Long-term opioid therapy reconsidered. Ann Intern Med. 2011 Sep 6;155(5):325-8. doi: 10.7326/[id number]. PMID: 21893626; PMCID: PMC3280085. Bedson J, Chen Y, Ashworth J, Hayward RA, Dunn KM, Jordan KP. Risk of adverse events in patients prescribed long-term opioids: A cohort study in the [place] Clinical Practice Research Datalink. Eur J Pain. 2019 May;23(5):908-922. doi: 10.1002/ejp.1357. Epub 2019 Jan 31. PMID: 30620116. Colameco S, Coren JS, Ciervo CA. Continuous opioid treatment for chronic noncancer pain: a time for moderation in prescribing. Postgrad Med. 2009 Jul;121(4):61-6. doi: 10.3810/pgm.2009.07.2032. PMID: 19641271. Chou R, Turner JA, Devine EB, Hansen RN, Sullivan SD, Blazina I, Dana T, Bougatsos C, Deyo RA. The  effectiveness and risks of long-term opioid therapy for chronic pain: a systematic review for a National Institutes of Health Pathways to Prevention Workshop. Ann Intern Med. 2015 Feb 17;162(4):276-86. doi: 10.7326/M14-2559. PMID: 25581257. Warner M, Chen LH, Makuc DM. NCHS Data Brief No. 22. [place]: Centers for Disease Control and Prevention; 2009. Sep, Increase in Fatal Poisonings Involving Opioid Analgesics in the [place], 1999-2006. Song IA, Choi HR, Oh TK. Long-term opioid use and mortality in patients with chronic non-cancer pain: Ten-year follow-up study in [place] from 2010 through 2019. EClinicalMedicine. 2022 Jul 18;51:101558. doi: 10.1016/j.eclinm.2022.101558. PMID: 35875817; PMCID: PMC9304910. Huser, W., Schubert, T., Vogelmann, T. et al. All-cause mortality in patients with long-term opioid therapy compared with non-opioid analgesics for chronic non-cancer pain: a database study. BMC Med 18, 162 (2020). https://doi.org/10.1186/[email or web address] Rashidian H, Zendehdel K, Kamangar F, Malekzadeh R, Haghdoost AA. An Ecological Study of the Association between Opiate Use and Incidence of Cancers. Addict Health. 2016 Fall;8(4):252-260. PMID: 28819556; PMCID: PMC5554805.  Our Goal: Our goal is to control your pain with means other than the use of opioid pain medications.  Our Recommendation: Talk to your physician about coming off of these medications. We can assist you with the tapering down and stopping these medicines. Based on the new information, even if you cannot completely stop the medication, a decrease in the dose may be associated with a lesser risk. Ask for other means of controlling the pain. Decrease or eliminate those factors that significantly contribute to your pain such as smoking, obesity, and a diet heavily tilted towards "inflammatory" nutrients.  Last Updated: 11/29/2022    ____________________________________________________________________________________________     ____________________________________________________________________________________________  Patient Information update  To: All of our patients.  Re: Name change.  It has been made official that our current name, "[facility]"   will soon be changed to "[facility]".   The purpose of this change is to eliminate any confusion created by the concept of our practice being a "Medication Management Pain Clinic". In the past this has led to the misconception that we treat pain primarily by the use of prescription medications.  Nothing can be farther from the truth.   Understanding PAIN MANAGEMENT: To further understand what our practice does, you first have to understand that "Pain Management" is a subspecialty that requires additional training once a physician has completed their specialty training, which can be in either Anesthesia, Neurology, Psychiatry, or Physical Medicine and Rehabilitation (PMR). Each one of these contributes to the final approach taken by each physician to   the management of their patient's pain. To be a "Pain Management Specialist" you must have first completed one of the specialty trainings below.  Anesthesiologists - trained in clinical pharmacology and interventional techniques such as nerve blockade and regional as well as central neuroanatomy. They are trained to block pain before, during, and after surgical interventions.  Neurologists - trained in the diagnosis and pharmacological treatment of complex neurological conditions, such as Multiple Sclerosis, Parkinson's, spinal cord injuries, and other systemic conditions that may be associated with symptoms that may include but are not limited to pain. They tend to rely primarily on the treatment of chronic pain  using prescription medications.  Psychiatrist - trained in conditions affecting the psychosocial wellbeing of patients including but not limited to depression, anxiety, schizophrenia, personality disorders, addiction, and other substance use disorders that may be associated with chronic pain. They tend to rely primarily on the treatment of chronic pain using prescription medications.   Physical Medicine and Rehabilitation (PMR) physicians, also known as physiatrists - trained to treat a wide variety of medical conditions affecting the brain, spinal cord, nerves, bones, joints, ligaments, muscles, and tendons. Their training is primarily aimed at treating patients that have suffered injuries that have caused severe physical impairment. Their training is primarily aimed at the physical therapy and rehabilitation of those patients. They may also work alongside orthopedic surgeons or neurosurgeons using their expertise in assisting surgical patients to recover after their surgeries.  INTERVENTIONAL PAIN MANAGEMENT is sub-subspecialty of Pain Management.  Our physicians are Board-certified in Anesthesia, Pain Management, and Interventional Pain Management.  This meaning that not only have they been trained and Board-certified in their specialty of Anesthesia, and subspecialty of Pain Management, but they have also received further training in the sub-subspecialty of Interventional Pain Management, in order to become Board-certified as INTERVENTIONAL PAIN MANAGEMENT SPECIALIST.    Mission: Our goal is to use our skills in  INTERVENTIONAL PAIN MANAGEMENT as alternatives to the chronic use of prescription opioid medications for the treatment of pain. To make this more clear, we have changed our name to reflect what we do and offer. We will continue to offer medication management assessment and recommendations, but we will not be taking over any patient's medication  management.  ____________________________________________________________________________________________     ____________________________________________________________________________________________  National Pain Medication Shortage  The [place] is experiencing worsening drug shortages. These have had a negative widespread effect on patient care and treatment. Not expected to improve any time soon. Predicted to last past 2029.   Drug shortage list (generic names) Oxycodone IR Oxycodone/APAP Oxymorphone IR Hydromorphone Hydrocodone/APAP Morphine  Where is the problem?  Manufacturing and supply level.  Will this shortage affect you?  Only if you take any of the above pain medications.  How? You may be unable to fill your prescription.  Your pharmacist may offer a "partial fill" of your prescription. (Warning: Do not accept partial fills.) Prescriptions partially filled cannot be transferred to another pharmacy. Read our Medication Rules and Regulation. Depending on how much medicine you are dependent on, you may experience withdrawals when unable to get the medication.  Recommendations: Consider ending your dependence on opioid pain medications. Ask your pain specialist to assist you with the process. Consider switching to a medication currently not in shortage, such as Buprenorphine. Talk to your pain specialist about this option. Consider decreasing your pain medication requirements by managing tolerance thru "Drug Holidays". This may help minimize withdrawals, should you run out of medicine. Control your pain thru   the use of non-pharmacological interventional therapies.   Your prescriber: Prescribers cannot be blamed for shortages. Medication manufacturing and supply issues cannot be fixed by the prescriber.   NOTE: The prescriber is not responsible for supplying the medication, or solving supply issues. Work with your pharmacist to solve it. The patient is responsible for  the decision to take or continue taking the medication and for identifying and securing a legal supply source. By law, supplying the medication is the job and responsibility of the pharmacy. The prescriber is responsible for the evaluation, monitoring, and prescribing of these medications.   Prescribers will NOT: Re-issue prescriptions that have been partially filled. Re-issue prescriptions already sent to a pharmacy.  Re-send prescriptions to a different pharmacy because yours did not have your medication. Ask pharmacist to order more medicine or transfer the prescription to another pharmacy. (Read below.)  New 2023 regulation: "June 02, 2022 Revised Regulation Allows DEA-Registered Pharmacies to Transfer Electronic Prescriptions at a Patient's Request DEA Headquarters Division - Public Information Office Patients now have the ability to request their electronic prescription be transferred to another pharmacy without having to go back to their practitioner to initiate the request. This revised regulation went into effect on Monday, May 29, 2022.     At a patient's request, a DEA-registered retail pharmacy can now transfer an electronic prescription for a controlled substance (schedules II-V) to another DEA-registered retail pharmacy. Prior to this change, patients would have to go through their practitioner to cancel their prescription and have it re-issued to a different pharmacy. The process was taxing and time consuming for both patients and practitioners.    The Drug Enforcement Administration (DEA) published its intent to revise the process for transferring electronic prescriptions on August 20, 2020.  The final rule was published in the federal register on April 27, 2022 and went into effect 30 days later.  Under the final rule, a prescription can only be transferred once between pharmacies, and only if allowed under existing state or other applicable law. The prescription must  remain in its electronic form; may not be altered in any way; and the transfer must be communicated directly between two licensed pharmacists. It's important to note, any authorized refills transfer with the original prescription, which means the entire prescription will be filled at the same pharmacy".  Reference: https://www.dea.gov/stories/2023/2023-06/2022-09-01/revised-regulation-allows-dea-registered-pharmacies-transfer (DEA website announcement)  https://www.govinfo.gov/content/pkg/FR-2022-04-27/pdf/2023-15847.pdf (Federal Register  Department of Justice)   Federal Register / Vol. 88, No. 143 / Thursday, April 27, 2022 / Rules and Regulations DEPARTMENT OF JUSTICE  Drug Enforcement Administration  21 CFR Part 1306  [Docket No. [id number]]  RIN [id number]-AB64 Transfer of Electronic Prescriptions for Schedules II-V Controlled Substances Between Pharmacies for Initial Filling  ____________________________________________________________________________________________     ____________________________________________________________________________________________  Transfer of Pain Medication between Pharmacies  Re: 2023 DEA Clarification on existing regulation  Published on DEA Website: June 02, 2022  Title: Revised Regulation Allows DEA-Registered Pharmacies to Transfer Electronic Prescriptions at a Patient's Request DEA Headquarters Division - Public Information Office  "Patients now have the ability to request their electronic prescription be transferred to another pharmacy without having to go back to their practitioner to initiate the request. This revised regulation went into effect on Monday, May 29, 2022.     At a patient's request, a DEA-registered retail pharmacy can now transfer an electronic prescription for a controlled substance (schedules II-V) to another DEA-registered retail pharmacy. Prior to this change, patients would have to go through their practitioner to  cancel their prescription   and have it re-issued to a different pharmacy. The process was taxing and time consuming for both patients and practitioners.    The Drug Enforcement Administration (DEA) published its intent to revise the process for transferring electronic prescriptions on August 20, 2020.  The final rule was published in the federal register on April 27, 2022 and went into effect 30 days later.  Under the final rule, a prescription can only be transferred once between pharmacies, and only if allowed under existing state or other applicable law. The prescription must remain in its electronic form; may not be altered in any way; and the transfer must be communicated directly between two licensed pharmacists. It's important to note, any authorized refills transfer with the original prescription, which means the entire prescription will be filled at the same pharmacy."    REFERENCES: 1. DEA website announcement https://www.dea.gov/stories/2023/2023-06/2022-09-01/revised-regulation-allows-dea-registered-pharmacies-transfer  2. Department of Justice website  https://www.govinfo.gov/content/pkg/FR-2022-04-27/pdf/2023-15847.pdf  3. DEPARTMENT OF JUSTICE Drug Enforcement Administration 21 CFR Part 1306 [Docket No. [id number]] RIN [id number] "Transfer of Electronic Prescriptions for Schedules II-V Controlled Substances Between Pharmacies for Initial Filling"  ____________________________________________________________________________________________     _______________________________________________________________________  Medication Rules  Purpose: To inform patients, and their family members, of our medication rules and regulations.  Applies to: All patients receiving prescriptions from our practice (written or electronic).  Pharmacy of record: This is the pharmacy where your electronic prescriptions will be sent. Make sure we have the correct one.  Electronic prescriptions: In  compliance with the [place] Strengthen Opioid Misuse Prevention (STOP) Act of 2017 (Session Law 2017-74/H243), effective October 02, 2018, all controlled substances must be electronically prescribed. Written prescriptions, faxing, or calling prescriptions to a pharmacy will no longer be done.  Prescription refills: These will be provided only during in-person appointments. No medications will be renewed without a "face-to-face" evaluation with your provider. Applies to all prescriptions.  NOTE: The following applies primarily to controlled substances (Opioid* Pain Medications).   Type of encounter (visit): For patients receiving controlled substances, face-to-face visits are required. (Not an option and not up to the patient.)  Patient's responsibilities: Pain Pills: Bring all pain pills to every appointment (except for procedure appointments). Pill Bottles: Bring pills in original pharmacy bottle. Bring bottle, even if empty. Always bring the bottle of the most recent fill.  Medication refills: You are responsible for knowing and keeping track of what medications you are taking and when is it that you will need a refill. The day before your appointment: write a list of all prescriptions that need to be refilled. The day of the appointment: give the list to the admitting nurse. Prescriptions will be written only during appointments. No prescriptions will be written on procedure days. If you forget a medication: it will not be "Called in", "Faxed", or "electronically sent". You will need to get another appointment to get these prescribed. No early refills. Do not call asking to have your prescription filled early. Partial  or short prescriptions: Occasionally your pharmacy may not have enough pills to fill your prescription.  NEVER ACCEPT a partial fill or a prescription that is short of the total amount of pills that you were prescribed.  With controlled substances the law allows 72 hours for  the pharmacy to complete the prescription.  If the prescription is not completed within 72 hours, the pharmacist will require a new prescription to be written. This means that you will be short on your medicine and we WILL NOT send another prescription to complete your original   prescription.  Instead, request the pharmacy to send a carrier to a nearby branch to get enough medication to provide you with your full prescription. Prescription Accuracy: You are responsible for carefully inspecting your prescriptions before leaving our office. Have the discharge nurse carefully go over each prescription with you, before taking them home. Make sure that your name is accurately spelled, that your address is correct. Check the name and dose of your medication to make sure it is accurate. Check the number of pills, and the written instructions to make sure they are clear and accurate. Make sure that you are given enough medication to last until your next medication refill appointment. Taking Medication: Take medication as prescribed. When it comes to controlled substances, taking less pills or less frequently than prescribed is permitted and encouraged. Never take more pills than instructed. Never take the medication more frequently than prescribed.  Inform other Doctors: Always inform, all of your healthcare providers, of all the medications you take. Pain Medication from other Providers: You are not allowed to accept any additional pain medication from any other Doctor or Healthcare provider. There are two exceptions to this rule. (see below) In the event that you require additional pain medication, you are responsible for notifying us, as stated below. Cough Medicine: Often these contain an opioid, such as codeine or hydrocodone. Never accept or take cough medicine containing these opioids if you are already taking an opioid* medication. The combination may cause respiratory failure and death. Medication Agreement:  You are responsible for carefully reading and following our Medication Agreement. This must be signed before receiving any prescriptions from our practice. Safely store a copy of your signed Agreement. Violations to the Agreement will result in no further prescriptions. (Additional copies of our Medication Agreement are available upon request.) Laws, Rules, & Regulations: All patients are expected to follow all Federal and State Laws, Statutes, Rules, & Regulations. Ignorance of the Laws does not constitute a valid excuse.  Illegal drugs and Controlled Substances: The use of illegal substances (including, but not limited to marijuana and its derivatives) and/or the illegal use of any controlled substances is strictly prohibited. Violation of this rule may result in the immediate and permanent discontinuation of any and all prescriptions being written by our practice. The use of any illegal substances is prohibited. Adopted CDC guidelines & recommendations: Target dosing levels will be at or below 60 MME/day. Use of benzodiazepines** is not recommended.  Exceptions: There are only two exceptions to the rule of not receiving pain medications from other Healthcare Providers. Exception #1 (Emergencies): In the event of an emergency (i.e.: accident requiring emergency care), you are allowed to receive additional pain medication. However, you are responsible for: As soon as you are able, call our office [phone], at any time of the day or night, and leave a message stating your name, the date and nature of the emergency, and the name and dose of the medication prescribed. In the event that your call is answered by a member of our staff, make sure to document and save the date, time, and the name of the person that took your information.  Exception #2 (Planned Surgery): In the event that you are scheduled by another doctor or dentist to have any type of surgery or procedure, you are allowed (for a period no  longer than 30 days), to receive additional pain medication, for the acute post-op pain. However, in this case, you are responsible for picking up a copy of   our "Post-op Pain Management for Surgeons" handout, and giving it to your surgeon or dentist. This document is available at our office, and does not require an appointment to obtain it. Simply go to our office during business hours (Monday-Thursday from 8:00 AM to 4:00 PM) (Friday 8:00 AM to 12:00 Noon) or if you have a scheduled appointment with us, prior to your surgery, and ask for it by name. In addition, you are responsible for: calling our office [phone], at any time of the day or night, and leaving a message stating your name, name of your surgeon, type of surgery, and date of procedure or surgery. Failure to comply with your responsibilities may result in termination of therapy involving the controlled substances. Medication Agreement Violation. Following the above rules, including your responsibilities will help you in avoiding a Medication Agreement Violation ("Breaking your Pain Medication Contract").  Consequences:  Not following the above rules may result in permanent discontinuation of medication prescription therapy.  *Opioid medications include: morphine, codeine, oxycodone, oxymorphone, hydrocodone, hydromorphone, meperidine, tramadol, tapentadol, buprenorphine, fentanyl, methadone. **Benzodiazepine medications include: diazepam (Valium), alprazolam (Xanax), clonazepam (Klonopine), lorazepam (Ativan), clorazepate (Tranxene), chlordiazepoxide (Librium), estazolam (Prosom), oxazepam (Serax), temazepam (Restoril), triazolam (Halcion) (Last updated: 07/25/2022) ______________________________________________________________________    ______________________________________________________________________  Medication Recommendations and Reminders  Applies to: All patients receiving prescriptions (written and/or  electronic).  Medication Rules & Regulations: You are responsible for reading, knowing, and following our "Medication Rules" document. These exist for your safety and that of others. They are not flexible and neither are we. Dismissing or ignoring them is an act of "non-compliance" that may result in complete and irreversible termination of such medication therapy. For safety reasons, "non-compliance" will not be tolerated. As with the [place] fundamental legal principle of "ignorance of the law is no defense", we will accept no excuses for not having read and knowing the content of documents provided to you by our practice.  Pharmacy of record:  Definition: This is the pharmacy where your electronic prescriptions will be sent.  We do not endorse any particular pharmacy. It is up to you and your insurance to decide what pharmacy to use.  We do not restrict you in your choice of pharmacy. However, once we write for your prescriptions, we will NOT be re-sending more prescriptions to fix restricted supply problems created by your pharmacy, or your insurance.  The pharmacy listed in the electronic medical record should be the one where you want electronic prescriptions to be sent. If you choose to change pharmacy, simply notify our nursing staff. Changes will be made only during your regular appointments and not over the phone.  Recommendations: Keep all of your pain medications in a safe place, under lock and key, even if you live alone. We will NOT replace lost, stolen, or damaged medication. We do not accept "Police Reports" as proof of medications having been stolen. After you fill your prescription, take 1 week's worth of pills and put them away in a safe place. You should keep a separate, properly labeled bottle for this purpose. The remainder should be kept in the original bottle. Use this as your primary supply, until it runs out. Once it's gone, then you know that you have 1 week's worth of medicine,  and it is time to come in for a prescription refill. If you do this correctly, it is unlikely that you will ever run out of medicine. To make sure that the above recommendation works, it is very important that you make   sure your medication refill appointments are scheduled at least 1 week before you run out of medicine. To do this in an effective manner, make sure that you do not leave the office without scheduling your next medication management appointment. Always ask the nursing staff to show you in your prescription , when your medication will be running out. Then arrange for the receptionist to get you a return appointment, at least 7 days before you run out of medicine. Do not wait until you have 1 or 2 pills left, to come in. This is very poor planning and does not take into consideration that we may need to cancel appointments due to bad weather, sickness, or emergencies affecting our staff. DO NOT ACCEPT A "Partial Fill": If for any reason your pharmacy does not have enough pills/tablets to completely fill or refill your prescription, do not allow for a "partial fill". The law allows the pharmacy to complete that prescription within 72 hours, without requiring a new prescription. If they do not fill the rest of your prescription within those 72 hours, you will need a separate prescription to fill the remaining amount, which we will NOT provide. If the reason for the partial fill is your insurance, you will need to talk to the pharmacist about payment alternatives for the remaining tablets, but again, DO NOT ACCEPT A PARTIAL FILL, unless you can trust your pharmacist to obtain the remainder of the pills within 72 hours.  Prescription refills and/or changes in medication(s):  Prescription refills, and/or changes in dose or medication, will be conducted only during scheduled medication management appointments. (Applies to both, written and electronic prescriptions.) No refills on procedure days. No  medication will be changed or started on procedure days. No changes, adjustments, and/or refills will be conducted on a procedure day. Doing so will interfere with the diagnostic portion of the procedure. No phone refills. No medications will be "called into the pharmacy". No Fax refills. No weekend refills. No Holliday refills. No after hours refills.  Remember:  Business hours are:  Monday to Thursday 8:00 AM to 4:00 PM Provider's Schedule: Ravindra Baranek, MD - Appointments are:  Medication management: Monday and Wednesday 8:00 AM to 4:00 PM Procedure day: Tuesday and Thursday 7:30 AM to 4:00 PM Bilal Lateef, MD - Appointments are:  Medication management: Tuesday and Thursday 8:00 AM to 4:00 PM Procedure day: Monday and Wednesday 7:30 AM to 4:00 PM (Last update: 07/25/2022) ______________________________________________________________________    ____________________________________________________________________________________________  Drug Holidays  What is a "Drug Holiday"? Drug Holiday: is the name given to the process of slowly tapering down and temporarily stopping the pain medication for the purpose of decreasing or eliminating tolerance to the drug.  Benefits Improved effectiveness Decreased required effective dose Improved pain control End dependence on high dose therapy Decrease cost of therapy Uncovering "opioid-induced hyperalgesia". (OIH)  What is "opioid hyperalgesia"? It is a paradoxical increase in pain caused by exposure to opioids. Stopping the opioid pain medication, contrary to the expected, it actually decreases or completely eliminates the pain. Ref.: "A comprehensive review of opioid-induced hyperalgesia". Marion Lee, et.al. Pain Physician. 2011 Mar-Apr;14(2):145-61.  What is tolerance? Tolerance: the progressive loss of effectiveness of a pain medicine due to repetitive use. A common problem of opioid pain medications.  How long should a "Drug  Holiday" last? Effectiveness depends on the patient staying off all opioid pain medicines for a minimum of 14 consecutive days. (2 weeks)  How about just taking less of the medicine? Does not   work. Will not accomplish goal of eliminating the excess receptors.  How about switching to a different pain medicine? (AKA. "Opioid rotation") Does not work. Creates the illusion of effectiveness by taking advantage of inaccurate equivalent dose calculations between different opioids. -This "technique" was promoted by studies funded by pharmaceutical companies, such as [employer], creators of "OxyContin".  Can I stop the medicine "cold turkey"? We do not recommend it. You should always coordinate with your prescribing physician to make the transition as smoothly as possible. Avoid stopping the medicine abruptly without consulting. We recommend a "slow taper".  What is a slow taper? Taper: refers to the gradual decrease in dose.   How do I stop/taper the dose? Slowly. Decrease the daily amount of pills that you take by one (1) pill every seven (7) days. This is called a "slow downward taper". Example: if you normally take four (4) pills per day, drop it to three (3) pills per day for seven (7) days, then to two (2) pills per day for seven (7) days, then to one (1) per day for seven (7) days, and then stop the medicine. The 14 day "Drug Holiday" starts on the first day without medicine.   Will I experience withdrawals? Unlikely with a slow taper.  What triggers withdrawals? Withdrawals are triggered by the sudden/abrupt stop of high dose opioids. Withdrawals can be avoided by slowly decreasing the dose over a prolonged period of time.  What are withdrawals? Symptoms associated with sudden/abrupt reduction/stopping of high-dose, long-term use of pain medication. Withdrawal are seldom seen on low dose therapy, or patients rarely taking opioid medication.  Early Withdrawal Symptoms may  include: Agitation Anxiety Muscle aches Increased tearing Insomnia Runny nose Sweating Yawning  Late symptoms may include: Abdominal cramping Diarrhea Dilated pupils Goose bumps Nausea Vomiting  When could I see withdrawals? Onset: 8-24 hours after last use for most opioids. 12-48 hours for long-acting opioids (i.e.: methadone)  How long could they last? Duration: 4-10 days for most opioids. 14-21 days for long-acting opioids (i.e.: methadone)  What will happen after I complete my "Drug Holiday"? The need and indications for the opioid analgesic will be reviewed before restarting the medication. Dose requirements will likely decrease and the dose will need to be adjusted accordingly.   (Last update: 12/20/2022) ____________________________________________________________________________________________    ____________________________________________________________________________________________  WARNING: CBD (cannabidiol) & Delta (Delta-8 tetrahydrocannabinol) products.   Applicable to:  All individuals currently taking or considering taking CBD (cannabidiol) and, more important, all patients taking opioid analgesic controlled substances (pain medication). (Example: oxycodone; oxymorphone; hydrocodone; hydromorphone; morphine; methadone; tramadol; tapentadol; fentanyl; buprenorphine; butorphanol; dextromethorphan; meperidine; codeine; etc.)  Introduction:  Recently there has been a drive towards the use of "natural" products for the treatment of different conditions, including pain anxiety and sleep disorders. Marijuana and hemp are two varieties of the cannabis genus plants. Marijuana and its derivatives are illegal, while hemp and its derivatives are not. Cannabidiol (CBD) and tetrahydrocannabinol (THC), are two natural compounds found in plants of the Cannabis genus. They can both be extracted from hemp or marijuana. Both compounds interact with your body's endocannabinoid  system in very different ways. CBD is associated with pain relief (analgesia) while THC is associated with the psychoactive effects ("the high") obtained from the use of marijuana products. There are two main types of THC: Delta-9, which comes from the marijuana plant and it is illegal, and Delta-8, which comes from the hemp plant, and it is legal. (Both, Delta-9-THC and Delta-8-THC are psychoactive and   give you "the high".)   Legality:  Marijuana and its derivatives: illegal Hemp and its derivatives: Legal (State dependent) UPDATE: (11/18/2021) The Drug Enforcement Agency (DEA) issued a letter stating that "delta" cannabinoids, including Delta-8-THCO and Delta-9-THCO, synthetically derived from hemp do not qualify as hemp and will be viewed as Schedule I drugs. (Schedule I drugs, substances, or chemicals are defined as drugs with no currently accepted medical use and a high potential for abuse. Some examples of Schedule I drugs are: heroin, lysergic acid diethylamide (LSD), marijuana (cannabis), 3,4-methylenedioxymethamphetamine (ecstasy), methaqualone, and peyote.) (https://www.dea.gov)  Legal status of CBD in [place]:  "Conditionally Legal"  Reference: "FDA Regulation of Cannabis and Cannabis-Derived Products, Including Cannabidiol (CBD)" - https://www.fda.gov/news-events/public-health-focus/fda-regulation-cannabis-and-cannabis-derived-products-including-cannabidiol-cbd  Warning:  CBD is not FDA approved and has not undergo the same manufacturing controls as prescription drugs.  This means that the purity and safety of available CBD may be questionable. Most of the time, despite manufacturer's claims, it is contaminated with THC (delta-9-tetrahydrocannabinol - the chemical in marijuana responsible for the "HIGH").  When this is the case, the THC contaminant will trigger a positive urine drug screen (UDS) test for Marijuana (carboxy-THC).   The FDA recently put out a warning about 5 things that everyone  should be aware of regarding Delta-8 THC: Delta-8 THC products have not been evaluated or approved by the FDA for safe use and may be marketed in ways that put the public health at risk. The FDA has received adverse event reports involving delta-8 THC-containing products. Delta-8 THC has psychoactive and intoxicating effects. Delta-8 THC manufacturing often involve use of potentially harmful chemicals to create the concentrations of delta-8 THC claimed in the marketplace. The final delta-8 THC product may have potentially harmful by-products (contaminants) due to the chemicals used in the process. Manufacturing of delta-8 THC products may occur in uncontrolled or unsanitary settings, which may lead to the presence of unsafe contaminants or other potentially harmful substances. Delta-8 THC products should be kept out of the reach of children and pets.  NOTE: Because a positive UDS for any illicit substance is a violation of our medication agreement, your opioid analgesics (pain medicine) may be permanently discontinued.  MORE ABOUT CBD  General Information: CBD was discovered in 1940 and it is a derivative of the cannabis sativa genus plants (Marijuana and Hemp). It is one of the 113 identified substances found in Marijuana. It accounts for up to 40% of the plant's extract. As of 2018, preliminary clinical studies on CBD included research for the treatment of anxiety, movement disorders, and pain. CBD is available and consumed in multiple forms, including inhalation of smoke or vapor, as an aerosol spray, and by mouth. It may be supplied as an oil containing CBD, capsules, dried cannabis, or as a liquid solution. CBD is thought not to be as psychoactive as THC (delta-9-tetrahydrocannabinol - the chemical in marijuana responsible for the "HIGH"). Studies suggest that CBD may interact with different biological target receptors in the body, including cannabinoid and other neurotransmitter receptors. As of  2018 the mechanism of action for its biological effects has not been determined.  Side-effects  Adverse reactions: Dry mouth, diarrhea, decreased appetite, fatigue, drowsiness, malaise, weakness, sleep disturbances, and others.  Drug interactions:  CBD may interact with medications such as blood-thinners. CBD causes drowsiness on its own and it will increase drowsiness caused by other medications, including antihistamines (such as Benadryl), benzodiazepines (Xanax, Ativan, Valium), antipsychotics, antidepressants, opioids, alcohol and supplements such as kava, melatonin and St. John's Wort.    Other drug interactions: Brivaracetam (Briviact); Caffeine; Carbamazepine (Tegretol); Citalopram (Celexa); Clobazam (Onfi); Eslicarbazepine (Aptiom); Everolimus (Zostress); Lithium; Methadone (Dolophine); Rufinamide (Banzel); Sedative medications (CNS depressants); Sirolimus (Rapamune); Stiripentol (Diacomit); Tacrolimus (Prograf); Tamoxifen ; Soltamox); Topiramate (Topamax); Valproate; Warfarin (Coumadin); Zonisamide. (Last update: 09/11/2022) ____________________________________________________________________________________________   ____________________________________________________________________________________________  Naloxone Nasal Spray  Why am I receiving this medication? [place] STOP ACT requires that all patients taking high dose opioids or at risk of opioids respiratory depression, be prescribed an opioid reversal agent, such as Naloxone (AKA: Narcan).  What is this medication? NALOXONE (nal OX one) treats opioid overdose, which causes slow or shallow breathing, severe drowsiness, or trouble staying awake. Call emergency services after using this medication. You may need additional treatment. Naloxone works by reversing the effects of opioids. It belongs to a group of medications called opioid blockers.  COMMON BRAND NAME(S): Kloxxado, Narcan  What should I tell my care team before  I take this medication? They need to know if you have any of these conditions: Heart disease Substance use disorder An unusual or allergic reaction to naloxone, other medications, foods, dyes, or preservatives Pregnant or trying to get pregnant Breast-feeding  When to use this medication? This medication is to be used for the treatment of respiratory depression (less than 8 breaths per minute) secondary to opioid overdose.   How to use this medication? This medication is for use in the nose. Lay the person on their back. Support their neck with your hand and allow the head to tilt back before giving the medication. The nasal spray should be given into 1 nostril. After giving the medication, move the person onto their side. Do not remove or test the nasal spray until ready to use. Get emergency medical help right away after giving the first dose of this medication, even if the person wakes up. You should be familiar with how to recognize the signs and symptoms of a narcotic overdose. If more doses are needed, give the additional dose in the other nostril. Talk to your care team about the use of this medication in children. While this medication may be prescribed for children as young as newborns for selected conditions, precautions do apply.  Naloxone Overdosage: If you think you have taken too much of this medicine contact a poison control center or emergency room at once.  NOTE: This medicine is only for you. Do not share this medicine with others.  What if I miss a dose? This does not apply.  What may interact with this medication? This is only used during an emergency. No interactions are expected during emergency use. This list may not describe all possible interactions. Give your health care provider a list of all the medicines, herbs, non-prescription drugs, or dietary supplements you use. Also tell them if you smoke, drink alcohol, or use illegal drugs. Some items may interact with  your medicine.  What should I watch for while using this medication? Keep this medication ready for use in the case of an opioid overdose. Make sure that you have the phone number of your care team and local hospital ready. You may need to have additional doses of this medication. Each nasal spray contains a single dose. Some emergencies may require additional doses. After use, bring the treated person to the nearest hospital or call 911. Make sure the treating care team knows that the person has received a dose of this medication. You will receive additional instructions on what to do during and after use of this   medication before an emergency occurs.  What side effects may I notice from receiving this medication? Side effects that you should report to your care team as soon as possible: Allergic reactions--skin rash, itching, hives, swelling of the face, lips, tongue, or throat Side effects that usually do not require medical attention (report these to your care team if they continue or are bothersome): Constipation Dryness or irritation inside the nose Headache Increase in blood pressure Muscle spasms Stuffy nose Toothache This list may not describe all possible side effects. Call your doctor for medical advice about side effects. You may report side effects to FDA at 1-800-FDA-1088.  Where should I keep my medication? Because this is an emergency medication, you should keep it with you at all times.  Keep out of the reach of children and pets. Store between 20 and 25 degrees C (68 and 77 degrees F). Do not freeze. Throw away any unused medication after the expiration date. Keep in original box until ready to use.  NOTE: This sheet is a summary. It may not cover all possible information. If you have questions about this medicine, talk to your doctor, pharmacist, or health care provider.   2023 Elsevier/Gold Standard (2021-05-27  00:00:00)  ____________________________________________________________________________________________   

## 2023-01-15 NOTE — Progress Notes (Signed)
Safety precautions to be maintained throughout the outpatient stay will include: orient to surroundings, keep bed in low position, maintain call bell within reach at all times, provide assistance with transfer out of bed and ambulation.    Nursing Pain Medication Assessment:  Safety precautions to be maintained throughout the outpatient stay will include: orient to surroundings, keep bed in low position, maintain call bell within reach at all times, provide assistance with transfer out of bed and ambulation.  Medication Inspection Compliance: Pill count conducted under aseptic conditions, in front of the patient. Neither the pills nor the bottle was removed from the patient's sight at any time. Once count was completed pills were immediately returned to the patient in their original bottle.  Medication: Hydrocodone/APAP Pill/Patch Count:  56 of 60 pills remain Pill/Patch Appearance: Markings consistent with prescribed medication Bottle Appearance: Standard pharmacy container. Clearly labeled. Filled Date: 04 / 01 / 2024 Last Medication intake:  Today

## 2023-01-15 NOTE — Telephone Encounter (Signed)
Please advise if ok to refill Atorvastatin 40 mg. Last filled by PCP.

## 2023-01-16 ENCOUNTER — Inpatient Hospital Stay: Payer: Medicare Other | Admitting: Family

## 2023-01-16 MED ORDER — ATORVASTATIN CALCIUM 40 MG PO TABS: 40.0000 mg | ORAL_TABLET | Freq: Every day | ORAL | 0 refills | Status: DC

## 2023-01-17 DIAGNOSIS — Z9181 History of falling: Secondary | ICD-10-CM | POA: Diagnosis not present

## 2023-01-30 ENCOUNTER — Other Ambulatory Visit: Payer: Self-pay | Admitting: Internal Medicine

## 2023-01-30 DIAGNOSIS — E785 Hyperlipidemia, unspecified: Secondary | ICD-10-CM

## 2023-01-31 ENCOUNTER — Other Ambulatory Visit: Payer: Self-pay | Admitting: Family

## 2023-01-31 DIAGNOSIS — E785 Hyperlipidemia, unspecified: Secondary | ICD-10-CM

## 2023-02-01 DIAGNOSIS — Z9181 History of falling: Secondary | ICD-10-CM | POA: Diagnosis not present

## 2023-02-01 MED ORDER — ATORVASTATIN CALCIUM 40 MG PO TABS: 40.0000 mg | ORAL_TABLET | Freq: Every day | ORAL | 0 refills | Status: DC

## 2023-02-01 MED ORDER — ATORVASTATIN CALCIUM 40 MG PO TABS: 40.0000 mg | ORAL_TABLET | Freq: Every day | ORAL | 3 refills | Status: DC

## 2023-02-02 ENCOUNTER — Ambulatory Visit: Payer: Medicare Other | Attending: Cardiology | Admitting: Cardiology

## 2023-02-02 VITALS — BP 120/68 | HR 96 | Ht 60.0 in | Wt 179.0 lb

## 2023-02-02 DIAGNOSIS — Z95818 Presence of other cardiac implants and grafts: Secondary | ICD-10-CM

## 2023-02-02 DIAGNOSIS — I4821 Permanent atrial fibrillation: Secondary | ICD-10-CM

## 2023-02-02 DIAGNOSIS — E785 Hyperlipidemia, unspecified: Secondary | ICD-10-CM

## 2023-02-02 DIAGNOSIS — I25810 Atherosclerosis of coronary artery bypass graft(s) without angina pectoris: Secondary | ICD-10-CM

## 2023-02-02 LAB — BASIC METABOLIC PANEL
BUN/Creatinine Ratio: 16 (ref 12–28)
BUN: 14 mg/dL (ref 8–27)
CO2: 24 mmol/L (ref 20–29)
Calcium: 9.1 mg/dL (ref 8.7–10.3)
Chloride: 101 mmol/L (ref 96–106)
Creatinine, Ser: 0.85 mg/dL (ref 0.57–1.00)
Glucose: 117 mg/dL — ABNORMAL HIGH (ref 70–99)
Potassium: 4.5 mmol/L (ref 3.5–5.2)
Sodium: 139 mmol/L (ref 134–144)
eGFR: 69 mL/min/{1.73_m2} (ref >59)

## 2023-02-02 MED ORDER — CLOPIDOGREL BISULFATE 75 MG PO TABS: 75.0000 mg | ORAL_TABLET | Freq: Every day | ORAL | 3 refills | Status: DC

## 2023-02-02 MED ORDER — APIXABAN 5 MG PO TABS: ORAL_TABLET | ORAL | 3 refills | Status: DC

## 2023-02-02 NOTE — Patient Instructions (Signed)
Medication Instructions:  Your physician has recommended you make the following change in your medication:  STOP ELIQUIS ON 5/12 START PLAVIX 75 MG  DAILY ON 5/13  *If you need a refill on your cardiac medications before your next appointment, please call your pharmacy*   Lab Work: TODAY: BMET If you have labs (blood work) drawn today and your tests are completely normal, you will receive your results only by: MyChart Message (if you have MyChart) OR A paper copy in the mail If you have any lab test that is abnormal or we need to change your treatment, we will call you to review the results.   Testing/Procedures: SEE INSTRUCTION LETTER   Follow-Up: At [facility], you and your health needs are our priority.  As part of our continuing mission to provide you with exceptional heart care, we have created designated Provider Care Teams.  These Care Teams include your primary Cardiologist (physician) and Advanced Practice Providers (APPs -  Physician Assistants and Nurse Practitioners) who all work together to provide you with the care you need, when you need it.  We recommend signing up for the patient portal called "MyChart".  Sign up information is provided on this After Visit Summary.  MyChart is used to connect with patients for Virtual Visits (Telemedicine).  Patients are able to view lab/test results, encounter notes, upcoming appointments, etc.  Non-urgent messages can be sent to your provider as well.   To learn more about what you can do with MyChart, go to [email or web address].    Your next appointment:   KEEP SCHEDULED APPOINTMENT

## 2023-02-02 NOTE — Progress Notes (Signed)
[facility]                                     Cardiology Office Note:    Date:  02/02/2023   ID:  Carmen [name], DOB January 21, 1941, MRN [id number]  PCP:  Carmen Sawyers, FNP  [facility] Cardiologist:  Carmen Kendall, MD  [facility] Electrophysiologist:  Carmen Prude, MD   Referring MD: Carmen Sawyers, FNP   Chief Complaint  Patient presents with   Follow-up    S/p LAAO    History of Present Illness:    Carmen [name] is a 82 y.o. female with a hx of  CAD s/p CABG in 2005 with LIMA to LAD, sequential SVG to diagonal and OM, and SVG to PDA with subsequent PCI due to failure of multiple grafts (Taxus stents to proximal LAD, mid LCx, and RCA), CVA, PAD, HTN, HLD, hypothyroidism, anemia, frequent falls, and persistent atrial fibrillation who is being seen today prior to LAAO closure with Dr. Lalla Brothers.    Ms. Tan has been followed by Dr. Okey Dupre for her cardiology care after moving to [place] to be closer to family. She underwent echo on 12/14/2020 that showed an EF of 55-60%, no RWMA, moderate LVH, normal RVSF and ventricular size, normal PASP, mildly dilated left atrium, and mild mitral regurgitation. Lower extremity ABIs were normal bilaterally.     She was admitted to the hospital in 05/2022 with altered mental status/unresponsiveness episode concerning for TIA.  She also reported a fall where she struck her forehead and had a hematoma. Carotid artery ultrasound during that admission showed right ICA stenosis of 1 to 39% with evidence consistent with a total occlusion of the left ICA, bilateral vertebral arteries with antegrade flow, and normal flow hemodynamics of the bilateral subclavian arteries. Echo showed an EF of 70 to 75%, mild concentric LVH, normal RV systolic function and ventricular cavity size, mildly dilated left atrium, and no significant valvular abnormalities. MRI of the brain was without acute abnormalities. She was discharged on Depakote at the recommendation of  neurology. She declined cardiac monitor recommended by neurology.  She presented to the ED a month later after sustaining a fall while using her bedside commode with associated periorbital hematoma. In follow up with Dr. Okey Dupre in12/2023, she continued to have ongoing falls. She was then referred to Dr. Lalla Brothers  for LAAO closure to avoid long-term anticoagulation given her frequent falls. She was felt to be an acceptable candidate and is scheduled for 12/28/22.  She is now s/p LAAO closure with Watchman FLX 31mm device. She was restarted on Eliquis 5 mg BID x 45 days (5/12) following implant. After 45 days, stop Eliquis and start Plavix 75 (5/13) po daily to complete 6 months of post implant therapy (9/28). Plan for CT scan 60 days after implant to assess Watchman position.   She is here today with her daughter and reports that she has been doing very well with no chest pain, LE edema, SOB, palpitations, bleeding in stool or urine, dizziness, or syncope.   Past Medical History:  Diagnosis Date   Abnormal CT scan, lumbar spine (05/11/2021) 05/17/2021   (05/11/2021) LUMBAR CT FINDINGS: Alignment: Lumbar levocurvature, apex L4. Mild lateral listhesis L4 on L5 of approximately 3 mm. Vertebrae: Remote appearing superior endplate deformities at T12 with 10% height loss and L1 with up to 20% height loss. Multilevel discogenic and facet degenerative  changes. Mild bilateral SI joint arthrosis.  DISC LEVELS: T11-T12: Near complete disc height loss with de   Abnormal MRI, cervical spine (05/12/2021) 05/17/2021   (05/12/2021) CERVICAL MRI FINDINGS: Motion artifact is present. Posterior Fossa, vertebral arteries, paraspinal tissues: Left superior cerebellar infarct.   DISC LEVELS: C2-C3: Disc bulge with endplate osteophytes. Uncovertebral and facet hypertrophy. C3-C4: Disc bulge with endplate osteophytes. Uncovertebral and facet hypertrophy. Mild canal stenosis. Marked foraminal stenosis. C4-C5: Disc bulge w   Allergy     Anemia    Anxiety    Arthritis    Back pain    Coronary artery disease    Depression    History of stroke 10/02/2020   Hypertension    Peripheral vascular disease (HCC)    Personal history of nicotine dependence 10/02/2020   Prsnl hx of TIA (TIA), and cereb infrc w/o resid deficits 10/02/2020   Stroke [facility])    Thyroid disease     Past Surgical History:  Procedure Laterality Date   back injections     CARDIAC CATHETERIZATION     CATARACT EXTRACTION     CORONARY ANGIOPLASTY     CORONARY ARTERY BYPASS GRAFT     2005   LEFT ATRIAL APPENDAGE OCCLUSION N/A 12/28/2022   Procedure: LEFT ATRIAL APPENDAGE OCCLUSION;  Surgeon: Carmen Prude, MD;  Location: [facility] INVASIVE CV LAB;  Service: Cardiovascular;  Laterality: N/A;   TEE WITHOUT CARDIOVERSION N/A 12/28/2022   Procedure: TRANSESOPHAGEAL ECHOCARDIOGRAM;  Surgeon: Carmen Prude, MD;  Location: [facility] INVASIVE CV LAB;  Service: Cardiovascular;  Laterality: N/A;    Current Medications: Current Meds  Medication Sig   aspirin EC 81 MG tablet Take 81 mg by mouth daily. Swallow whole.   atorvastatin (LIPITOR) 40 MG tablet Take 1 tablet (40 mg total) by mouth at bedtime.   CALCIUM PO Take 1 tablet by mouth daily.   Cholecalciferol (VITAMIN D-3) 25 MCG (1000 UT) CAPS Take 1,000 Units by mouth daily.   clopidogrel (PLAVIX) 75 MG tablet Take 1 tablet (75 mg total) by mouth daily. START ON 5/13   cyanocobalamin (VITAMIN B12) 500 MCG tablet Take 500 mcg by mouth daily.   diphenhydrAMINE (BENADRYL) 25 MG tablet Take 25 mg by mouth at bedtime as needed for allergies.   divalproex (DEPAKOTE) 500 MG DR tablet TAKE 1 TABLET BY MOUTH AT BEDTIME   famotidine (PEPCID AC) 10 MG tablet Take 10 mg by mouth daily as needed for heartburn or indigestion.   furosemide (LASIX) 20 MG tablet Take 1 tablet (20 mg total) by mouth daily as needed.   HYDROcodone-acetaminophen (NORCO/VICODIN) 5-325 MG tablet Take 1 tablet by mouth 2 (two) times daily as needed for  severe pain. Must last 30 days.   [START ON 03/22/2023] HYDROcodone-acetaminophen (NORCO/VICODIN) 5-325 MG tablet Take 1 tablet by mouth 2 (two) times daily as needed for severe pain. Must last 30 days.   hydroxychloroquine (PLAQUENIL) 200 MG tablet Take 200 mg by mouth 2 (two) times daily.   levothyroxine (SYNTHROID) 137 MCG tablet Take 1 tablet (137 mcg total) by mouth daily before breakfast.   losartan (COZAAR) 25 MG tablet Take half tablet once daily   magnesium oxide (MAG-OX) 400 (240 Mg) MG tablet Take 400 mg by mouth daily.   metoprolol succinate (TOPROL-XL) 100 MG 24 hr tablet TAKE 1/2 TABLET BY MOUTH AT BEDTIME   naloxone (NARCAN) nasal spray 4 mg/0.1 mL Place 1 spray into the nose as needed for up to 365 doses (for opioid-induced respiratory depresssion). In  case of emergency (overdose), spray once into each nostril. If no response within 3 minutes, repeat application and call 911.   traZODone (DESYREL) 100 MG tablet Take 1 tablet (100 mg total) by mouth at bedtime as needed for sleep   [DISCONTINUED] ELIQUIS 5 MG TABS tablet NEW PRESCRIPTION REQUEST: Eliquis 5 Mg TAKE ONE TABLET BY MOUTH TWICE DAILY     Allergies:   Patient has no known allergies.   Social History   Socioeconomic History   Marital status: Widowed    Spouse name: Not on file   Number of children: 3   Years of education: Not on file   Highest education level: Not on file  Occupational History   Not on file  Tobacco Use   Smoking status: Former    Years: 30    Types: Cigarettes    Quit date: 05/2020    Years since quitting: 2.7   Smokeless tobacco: Never   Tobacco comments:    Smoked about 1 pack per month  Vaping Use   Vaping Use: Never used  Substance and Sexual Activity   Alcohol use: Not Currently   Drug use: Yes    Types: Hydrocodone   Sexual activity: Not Currently  Other Topics Concern   Not on file  Social History Narrative   ** Merged History Encounter **       Social Determinants of  Health   Financial Resource Strain: Low Risk  (08/14/2022)   Overall Financial Resource Strain (CARDIA)    Difficulty of Paying Living Expenses: Not hard at all  Food Insecurity: No Food Insecurity (12/28/2022)   Hunger Vital Sign    Worried About Running Out of Food in the Last Year: Never true    Ran Out of Food in the Last Year: Never true  Transportation Needs: No Transportation Needs (12/28/2022)   PRAPARE - Administrator, Civil Service (Medical): No    Lack of Transportation (Non-Medical): No  Physical Activity: Insufficiently Active (08/14/2022)   Exercise Vital Sign    Days of Exercise per Week: 7 days    Minutes of Exercise per Session: 20 min  Stress: No Stress Concern Present (08/14/2022)   [employer] of Occupational Health - Occupational Stress Questionnaire    Feeling of Stress : Not at all  Social Connections: Not on file    Family History: The patient's family history includes Bipolar disorder in her daughter; Breast cancer in her mother; Heart attack in her father. There is no history of Diabetes Mellitus II.  ROS:   Please see the history of present illness.    All other systems reviewed and are negative.  EKGs/Labs/Other Studies Reviewed:    The following studies were reviewed today:  Watchman 12/28/22:  CONCLUSIONS:  1.Successful implantation of a WATCHMAN left atrial appendage occlusive device    2. TEE demonstrating no LAA thrombus 3. No early apparent complications.    Post Implant Anticoagulation Strategy: Continue Eliquis 5mg  PO BID x 45 days following implant. After 45 days, stop Eliquis and start Plavix 75mg  PO daily to complete 6 months of post implant therapy. Plan for CT scan 60 days after implant to assess Watchman position.   EKG:  EKG is ordered today.    Recent Labs: 04/14/2022: Pro B Natriuretic peptide (BNP) 430.0 06/22/2022: Magnesium 1.6 10/14/2022: ALT 26 10/27/2022: B Natriuretic Peptide 448.4 12/11/2022: BUN 13;  Creatinine, Ser 0.98; Hemoglobin 12.8; Platelets 221; Potassium 4.5; Sodium 139 01/09/2023: TSH 2.09   Recent Lipid  Panel    Component Value Date/Time   CHOL 156 05/09/2022 0559   TRIG 125 05/09/2022 0559   HDL 48 05/09/2022 0559   CHOLHDL 3.3 05/09/2022 0559   VLDL 25 05/09/2022 0559   LDLCALC 83 05/09/2022 0559   LDLDIRECT 99.0 07/26/2021 1432   Physical Exam:    VS:  BP 120/68   Pulse 96   Ht 5' (1.524 m)   Wt 179 lb (81.2 kg)   SpO2 97%   BMI 34.96 kg/m     Wt Readings from Last 3 Encounters:  02/02/23 179 lb (81.2 kg)  01/15/23 179 lb (81.2 kg)  01/09/23 179 lb 3.2 oz (81.3 kg)    General: Well developed, well nourished, NAD Lungs:Clear to ausculation bilaterally. No wheezes, rales, or rhonchi. Breathing is unlabored. Cardiovascular: RRR with S1 S2. No murmurs Extremities: No edema.  Neuro: Alert and oriented. No focal deficits. No facial asymmetry. MAE spontaneously. Psych: Responds to questions appropriately with normal affect.    ASSESSMENT/PLAN:    Permanent atrial fibrillation: s/p Watchman FLX 31mm device. Plan to continue Eliquis 5 mg BID x 45 days (5/12) following implant. After 45 days, stop Eliquis and start Plavix 75 (5/13) po daily to complete 6 months of post implant therapy (9/28). Plan for CT scan 60 days after implant to assess Watchman position. CT instruction letter reviewed with understanding. Obtain BMET today. Will follow at 6 months. Once CT performed and if no device leak or thrombus, would recommend dropping ASA and continuing Plavix monotherapy. Per Dr. Okey Dupre, "I would favor de-escalation of antithrombotic/antiplatelet therapy from apixaban and aspirin to clopidogrel monotherapy when felt to be appropriate by Dr. Lalla Brothers."  CAD: Follows with Dr. Okey Dupre. No anginal symptoms. Continue current regimen.   HLD: Continue atorvastatin 40 mg daily.   Hypertension: Stable with no changes needed at this time   Medication Adjustments/Labs and Tests  Ordered: Current medicines are reviewed at length with the patient today.  Concerns regarding medicines are outlined above.  Orders Placed This Encounter  Procedures   Basic metabolic panel   Meds ordered this encounter  Medications   apixaban (ELIQUIS) 5 MG TABS tablet    Sig: NEW PRESCRIPTION REQUEST: Eliquis 5 Mg TAKE ONE TABLET BY MOUTH TWICE DAILY. STOP ON 5/12    Dispense:  180 tablet    Refill:  3    STOP ON 5/12 AND START PLAVIX ON 5/13   clopidogrel (PLAVIX) 75 MG tablet    Sig: Take 1 tablet (75 mg total) by mouth daily. START ON 5/13    Dispense:  90 tablet    Refill:  3    STOP ELIQUIS ON 5/12 AND START PLAVIX ON 5/13    Patient Instructions  Medication Instructions:  Your physician has recommended you make the following change in your medication:  STOP ELIQUIS ON 5/12 START PLAVIX 75 MG  DAILY ON 5/13  *If you need a refill on your cardiac medications before your next appointment, please call your pharmacy*   Lab Work: TODAY: BMET If you have labs (blood work) drawn today and your tests are completely normal, you will receive your results only by: MyChart Message (if you have MyChart) OR A paper copy in the mail If you have any lab test that is abnormal or we need to change your treatment, we will call you to review the results.   Testing/Procedures: SEE INSTRUCTION LETTER   Follow-Up: At [facility], you and your health needs are our priority.  As part of our continuing mission to provide you with exceptional heart care, we have created designated Provider Care Teams.  These Care Teams include your primary Cardiologist (physician) and Advanced Practice Providers (APPs -  Physician Assistants and Nurse Practitioners) who all work together to provide you with the care you need, when you need it.  We recommend signing up for the patient portal called "MyChart".  Sign up information is provided on this After Visit Summary.  MyChart is used to connect  with patients for Virtual Visits (Telemedicine).  Patients are able to view lab/test results, encounter notes, upcoming appointments, etc.  Non-urgent messages can be sent to your provider as well.   To learn more about what you can do with MyChart, go to [email or web address].    Your next appointment:   KEEP SCHEDULED APPOINTMENT    Signed, Georgie Chard, NP  02/02/2023 2:01 PM    [facility]

## 2023-02-08 DIAGNOSIS — Z9181 History of falling: Secondary | ICD-10-CM | POA: Diagnosis not present

## 2023-02-21 ENCOUNTER — Encounter: Payer: Self-pay | Admitting: Family

## 2023-02-21 ENCOUNTER — Ambulatory Visit (INDEPENDENT_AMBULATORY_CARE_PROVIDER_SITE_OTHER)
Admission: RE | Admit: 2023-02-21 | Discharge: 2023-02-21 | Disposition: A | Discharge status: Home / Self Care | Payer: Medicare Other | Source: Ambulatory Visit | Attending: Family | Admitting: Family

## 2023-02-21 ENCOUNTER — Ambulatory Visit (INDEPENDENT_AMBULATORY_CARE_PROVIDER_SITE_OTHER): Payer: Medicare Other | Admitting: Family

## 2023-02-21 VITALS — BP 126/72 | HR 74 | Temp 97.9°F | Ht 60.0 in | Wt 177.0 lb

## 2023-02-21 DIAGNOSIS — S59901A Unspecified injury of right elbow, initial encounter: Secondary | ICD-10-CM | POA: Diagnosis not present

## 2023-02-21 DIAGNOSIS — I10 Essential (primary) hypertension: Secondary | ICD-10-CM | POA: Diagnosis not present

## 2023-02-21 DIAGNOSIS — Z9181 History of falling: Secondary | ICD-10-CM

## 2023-02-21 DIAGNOSIS — R531 Weakness: Secondary | ICD-10-CM

## 2023-02-21 DIAGNOSIS — S79911A Unspecified injury of right hip, initial encounter: Secondary | ICD-10-CM | POA: Diagnosis not present

## 2023-02-21 DIAGNOSIS — J301 Allergic rhinitis due to pollen: Secondary | ICD-10-CM

## 2023-02-21 DIAGNOSIS — R296 Repeated falls: Secondary | ICD-10-CM

## 2023-02-21 DIAGNOSIS — M25521 Pain in right elbow: Secondary | ICD-10-CM | POA: Diagnosis not present

## 2023-02-21 MED ORDER — LORATADINE 10 MG PO TABS: 10.0000 mg | ORAL_TABLET | Freq: Every day | ORAL | 11 refills | Status: DC

## 2023-02-21 MED ORDER — LOSARTAN POTASSIUM 25 MG PO TABS: ORAL_TABLET | ORAL | 3 refills | Status: DC

## 2023-02-21 NOTE — Assessment & Plan Note (Signed)
Xray today to r/o acute concern such as fracture.  Ice prn

## 2023-02-21 NOTE — Assessment & Plan Note (Signed)
Worry for decreasing blood pressure potentially contributing to dizziness. We will reduce losartan to 12.5 mg once daily, continue metoprolol.   Maintain blood pressure log and bring to next visit.  Continue f/u with cardiology as scheduled as well.

## 2023-02-21 NOTE — Assessment & Plan Note (Signed)
Right hip xray pending results

## 2023-02-21 NOTE — Progress Notes (Signed)
Established Patient Office Visit  Subjective:   Patient ID: Carmen [name], female    DOB: [birth year]  Age: 82 y.o. MRN: [id number]  CC:  Chief Complaint  Patient presents with   Arm Injury    Bruise on left arm and upper thigh/buttocks area., due to a fall on Sunday.    HPI: Carmen [name] is a 82 y.o. female presenting on 02/21/2023 for Arm Injury (Bruise on left arm and upper thigh/buttocks area., due to a fall on Sunday.)   Arm Injury     Larey Seat about four days ago when her daugther was church she fell over the walker at home landing on her right hip as well as right elbow. She has bruising today and tenderness.   She does state she gets dizziness, and hard to breath when she is lying flat and then she gets up out of bed and goes into bed and then stays there all night. This is new since the fall. She also states that mom has option of a sitter with her insurance but they have not yet utilized this.  She does suffer from allergies as well.   She is still getting weekly physical therapy for weakness.  She is on two blood thinners plavix and asa 81 mg once daily   Does take pain medication which can also increase her risk of falls.        ROS: Negative unless specifically indicated above in HPI.   Relevant past medical history reviewed and updated as indicated.   Allergies and medications reviewed and updated.   Current Outpatient Medications:    aspirin EC 81 MG tablet, Take 81 mg by mouth daily. Swallow whole., Disp: , Rfl:    atorvastatin (LIPITOR) 40 MG tablet, Take 1 tablet (40 mg total) by mouth at bedtime., Disp: 90 tablet, Rfl: 3   CALCIUM PO, Take 1 tablet by mouth daily., Disp: , Rfl:    Cholecalciferol (VITAMIN D-3) 25 MCG (1000 UT) CAPS, Take 1,000 Units by mouth daily., Disp: , Rfl:    clopidogrel (PLAVIX) 75 MG tablet, Take 1 tablet (75 mg total) by mouth daily. START ON 5/13, Disp: 90 tablet, Rfl: 3   cyanocobalamin (VITAMIN B12) 500 MCG tablet, Take 500  mcg by mouth daily., Disp: , Rfl:    diphenhydrAMINE (BENADRYL) 25 MG tablet, Take 25 mg by mouth at bedtime as needed for allergies., Disp: , Rfl:    divalproex (DEPAKOTE) 500 MG DR tablet, TAKE 1 TABLET BY MOUTH AT BEDTIME, Disp: 90 tablet, Rfl: 0   famotidine (PEPCID AC) 10 MG tablet, Take 10 mg by mouth daily as needed for heartburn or indigestion., Disp: , Rfl:    furosemide (LASIX) 20 MG tablet, Take 1 tablet (20 mg total) by mouth daily as needed., Disp: 30 tablet, Rfl: 5   HYDROcodone-acetaminophen (NORCO/VICODIN) 5-325 MG tablet, Take 1 tablet by mouth 2 (two) times daily as needed for severe pain. Must last 30 days., Disp: 60 tablet, Rfl: 0   [START ON 03/22/2023] HYDROcodone-acetaminophen (NORCO/VICODIN) 5-325 MG tablet, Take 1 tablet by mouth 2 (two) times daily as needed for severe pain. Must last 30 days., Disp: 60 tablet, Rfl: 0   hydroxychloroquine (PLAQUENIL) 200 MG tablet, Take 200 mg by mouth 2 (two) times daily., Disp: , Rfl:    levothyroxine (SYNTHROID) 137 MCG tablet, Take 1 tablet (137 mcg total) by mouth daily before breakfast., Disp: 90 tablet, Rfl: 0   loratadine (CLARITIN) 10 MG tablet, Take 1 tablet (10 mg  total) by mouth daily., Disp: 30 tablet, Rfl: 11   losartan (COZAAR) 25 MG tablet, Take 1/2 tablet once daily, Disp: 45 tablet, Rfl: 3   magnesium oxide (MAG-OX) 400 (240 Mg) MG tablet, Take 400 mg by mouth daily., Disp: , Rfl:    metoprolol succinate (TOPROL-XL) 100 MG 24 hr tablet, TAKE 1/2 TABLET BY MOUTH AT BEDTIME, Disp: 45 tablet, Rfl: 0   naloxone (NARCAN) nasal spray 4 mg/0.1 mL, Place 1 spray into the nose as needed for up to 365 doses (for opioid-induced respiratory depresssion). In case of emergency (overdose), spray once into each nostril. If no response within 3 minutes, repeat application and call 911., Disp: 1 each, Rfl: 0   traZODone (DESYREL) 100 MG tablet, Take 1 tablet (100 mg total) by mouth at bedtime as needed for sleep, Disp: 90 tablet, Rfl: 0  No  Known Allergies  Objective:   BP 126/72 (BP Location: Left Arm)   Pulse 74   Temp 97.9 F (36.6 C) (Temporal)   Ht 5' (1.524 m)   Wt 177 lb (80.3 kg)   SpO2 98%   BMI 34.57 kg/m    Physical Exam Vitals reviewed.  Constitutional:      General: She is not in acute distress.    Appearance: Normal appearance. She is normal weight. She is not ill-appearing, toxic-appearing or diaphoretic.  HENT:     Head: Normocephalic.  Cardiovascular:     Rate and Rhythm: Normal rate and regular rhythm.  Pulmonary:     Effort: Pulmonary effort is normal.     Breath sounds: Normal breath sounds.  Musculoskeletal:        General: Normal range of motion.     Right hip: Bony tenderness (posterior right side of hip with bruising and swelling and tenderness on palpation) present. Normal range of motion.  Skin:    Findings: Bruising (right elbow along mid right outer forearm as well as up to mid upper arm) present.  Neurological:     General: No focal deficit present.     Mental Status: She is alert and oriented to person, place, and time. Mental status is at baseline.  Psychiatric:        Mood and Affect: Mood normal.        Behavior: Behavior normal.        Thought Content: Thought content normal.        Judgment: Judgment normal.     Assessment & Plan:  Hip injury, right, initial encounter Assessment & Plan: Right hip xray pending results  Orders: -     DG HIP UNILAT W OR W/O PELVIS 2-3 VIEWS RIGHT; Future  Elbow injury, right, initial encounter Assessment & Plan: Xray today to r/o acute concern such as fracture.  Ice prn   Orders: -     DG Elbow 2 Views Right; Future  Frequent falls  Essential hypertension Assessment & Plan: Worry for decreasing blood pressure potentially contributing to dizziness. We will reduce losartan to 12.5 mg once daily, continue metoprolol.   Maintain blood pressure log and bring to next visit.  Continue f/u with cardiology as scheduled as well.    Orders: -     Losartan Potassium; Take 1/2 tablet once daily  Dispense: 45 tablet; Refill: 3  Seasonal allergic rhinitis due to pollen -     Loratadine; Take 1 tablet (10 mg total) by mouth daily.  Dispense: 30 tablet; Refill: 11  Right sided weakness Assessment & Plan: Continue with physical therapy  weekly as scheduled.    At risk for falls Assessment & Plan: Worried for increased falls lately.  Did discuss with daughter and pt to continue with use of walker cane anytime transferring. Rise slowly from seated or lying position. Advised not to transfer alone without help as she is currently with someone in the home around the clock. Will decrease losartan to see if dizziness improves as suspected hypotension. Also advised to eat every 2-3 hours while awake to maintain steady glucose regulation. Suspect also contributing is fluid behind ear drums on exam, allergies, advised to start claritin and or allegra (without the D)      Follow up plan: Return in about 4 months (around 06/24/2023) for f/u blood pressure.  Mort Sawyers, FNP

## 2023-02-21 NOTE — Assessment & Plan Note (Signed)
Worried for increased falls lately.  Did discuss with daughter and pt to continue with use of walker cane anytime transferring. Rise slowly from seated or lying position. Advised not to transfer alone without help as she is currently with someone in the home around the clock. Will decrease losartan to see if dizziness improves as suspected hypotension. Also advised to eat every 2-3 hours while awake to maintain steady glucose regulation. Suspect also contributing is fluid behind ear drums on exam, allergies, advised to start claritin and or allegra (without the D)

## 2023-02-21 NOTE — Assessment & Plan Note (Signed)
Continue with physical therapy weekly as scheduled.

## 2023-02-22 ENCOUNTER — Telehealth (HOSPITAL_COMMUNITY): Payer: Self-pay | Admitting: *Deleted

## 2023-02-22 NOTE — Telephone Encounter (Signed)
Reaching out to patient to offer assistance regarding upcoming cardiac imaging study; pt's daughter answered phone and verbalizes understanding of appt date/time, parking situation and where to check in, pre-test NPO status, and verified current allergies; name and call back number provided for further questions should they arise  Larey Brick RN Navigator Cardiac Imaging [facility] [phone] office [phone] cell  Patient's daughter is aware patient is to arrive at 10:30am.

## 2023-02-23 ENCOUNTER — Ambulatory Visit (HOSPITAL_COMMUNITY)
Admission: RE | Admit: 2023-02-23 | Discharge: 2023-02-23 | Disposition: A | Discharge status: Home / Self Care | Payer: Medicare Other | Source: Ambulatory Visit | Attending: Cardiology | Admitting: Cardiology

## 2023-02-23 DIAGNOSIS — I4821 Permanent atrial fibrillation: Secondary | ICD-10-CM

## 2023-02-23 DIAGNOSIS — Z95818 Presence of other cardiac implants and grafts: Secondary | ICD-10-CM | POA: Diagnosis not present

## 2023-02-23 MED ORDER — IOHEXOL 350 MG/ML SOLN
100.0000 mL | Freq: Once | INTRAVENOUS | Status: AC | PRN
  Administered 2023-02-23: 100 mL via INTRAVENOUS

## 2023-02-25 ENCOUNTER — Other Ambulatory Visit: Payer: Self-pay | Admitting: Family

## 2023-02-25 DIAGNOSIS — R2990 Unspecified symptoms and signs involving the nervous system: Secondary | ICD-10-CM

## 2023-02-27 ENCOUNTER — Other Ambulatory Visit: Payer: Self-pay | Admitting: Family

## 2023-02-27 ENCOUNTER — Other Ambulatory Visit: Payer: Self-pay | Admitting: Pain Medicine

## 2023-02-27 DIAGNOSIS — M545 Low back pain, unspecified: Secondary | ICD-10-CM

## 2023-02-27 DIAGNOSIS — G894 Chronic pain syndrome: Secondary | ICD-10-CM

## 2023-02-27 DIAGNOSIS — G8929 Other chronic pain: Secondary | ICD-10-CM

## 2023-02-27 DIAGNOSIS — Z79899 Other long term (current) drug therapy: Secondary | ICD-10-CM

## 2023-02-27 DIAGNOSIS — M47816 Spondylosis without myelopathy or radiculopathy, lumbar region: Secondary | ICD-10-CM

## 2023-02-27 DIAGNOSIS — Z79891 Long term (current) use of opiate analgesic: Secondary | ICD-10-CM

## 2023-02-27 DIAGNOSIS — R2990 Unspecified symptoms and signs involving the nervous system: Secondary | ICD-10-CM

## 2023-02-27 MED ORDER — LEVOTHYROXINE SODIUM 137 MCG PO TABS: 137.0000 ug | ORAL_TABLET | Freq: Every day | ORAL | 3 refills | Status: DC

## 2023-02-27 MED ORDER — DIVALPROEX SODIUM 500 MG PO DR TAB: 500.0000 mg | DELAYED_RELEASE_TABLET | Freq: Every day | ORAL | 3 refills | Status: DC

## 2023-03-05 DIAGNOSIS — K080 Exfoliation of teeth due to systemic causes: Secondary | ICD-10-CM | POA: Diagnosis not present

## 2023-03-12 ENCOUNTER — Other Ambulatory Visit: Payer: Self-pay | Admitting: Family

## 2023-03-29 ENCOUNTER — Encounter: Payer: Self-pay | Admitting: Neurology

## 2023-03-29 ENCOUNTER — Ambulatory Visit: Payer: Medicare Other | Admitting: Neurology

## 2023-03-29 VITALS — BP 138/89 | HR 82 | Ht 60.0 in | Wt 172.8 lb

## 2023-03-29 DIAGNOSIS — R413 Other amnesia: Secondary | ICD-10-CM | POA: Diagnosis not present

## 2023-03-29 DIAGNOSIS — Z8673 Personal history of transient ischemic attack (TIA), and cerebral infarction without residual deficits: Secondary | ICD-10-CM | POA: Diagnosis not present

## 2023-03-29 NOTE — Patient Instructions (Addendum)
I had a long discussion with the patient and her daughter regarding her episode of brief unresponsiveness and  weakness and discussed differential diagnosis including TIA versus syncopal event versus seizure and recommend she continue Depakote for prophylaxis.  Continue aspirin or stroke prevention given history recent Watchman device for A-fib. Maintain aggressive risk factor modification with strict control of hypertension with blood pressure goal below 130/90, lipids with LDL cholesterol goal below 70 mg percent and diabetes with hemoglobin A1c goal below 6.5%.  I recommended trial of Namenda for memory loss and also encouraged patient to increase participation in cognitively challenging activities like solving crossword puzzles, playing bridge and sudoku.  We also discussed memory compensation strategies.  I encouraged her to continue ongoing physical therapy and use a cane at all times and discussed fall safety precautions.  In case her memory loss progresses may consider trial of Namenda in the future.  Return for follow-up in the future in 6 months or call earlier if necessary.

## 2023-03-29 NOTE — Progress Notes (Signed)
[facility] [address] [place]. [place] [address] [phone]       OFFICE FOLLOW UP VISIT NOTE  [name] Date of Birth:  Oct 13, 1940 Medical Record Number:  [id number]   Referring MD:  Carmen [name]  Reason for Referral: Strokelike episode  HPI: Initial visit 06/08/2022:Carmen [name] is a 82 year old pleasant Caucasian lady seen today for initial office consultation visit for strokelike episode.  She is accompanied by her daughter.  History is obtained from them and review of electronic medical records and I personally reviewed pertinent available imaging films in PACS.  She has past medical history of chronic pain, remote stroke, depression, peripheral vascular disease, hypothyroidism, anemia, coronary artery disease, anxiety, chronic A-fib on long-term Eliquis.  She presented on 05/08/2022 from assisted living facility where her daughter was helping her back up for an appointment when all of a sudden she became stiff and staring and began breathing heavily and foaming at the mouth and was unresponsive.  Her daughter was able to catch her and help lower her gradually to the floor where she remained unresponsive for 5 to 10 minutes.  She was noted to have facial droop and right-sided weakness and some slurred speech when she started speaking.  CT head on admission was unremarkable.  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  There was noncalcified plaque of the right carotid with less than 50% stenosis.  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  2D echo showed ejection fraction of 70 to 75%.  LDL cholesterol was 83 mg percent and hemoglobin A1c 6.3.  EEG was obtained but it was normal and did not show any epileptiform activity.  Patient was continued on his Eliquis and discharged back to assisted  living facility.  The daughter states that she is doing well she has had no further recurrent episodes.  She has had no other stroke or TIA symptoms.  She is tolerating Eliquis well without bleeding or bruising.  She did have a history of stroke 2 years ago while she was in [place] and was in atrial fibrillation but at that time was not on anticoagulation.  She has had some balance problems remaining from that stroke.  She also had a second stroke following which she has had short-term memory difficulties which have persisted.  She is living in assisted living facility but is mostly independent.  She is able to ambulate with a cane.  She is currently getting physical therapy which seems to have helped.  She has chronic back pain and gets pain injections at the pain clinic in her back.  She remains on Lipitor which is tolerating well without side effects.  Her blood pressure is usually well controlled but today it is elevated at 168/115 in the office. Update 03/29/2023 : She returns for f/u after last visit in September 2023.  Patient was admitted to [facility] on 10/14/2022 for evaluation of sudden onset of overall and left-sided weakness.  Patient was unclear as to why she fell.  When EMS arrived they found her with decreased level of responsiveness with left-sided weakness and.  A code stroke.  On arrival in the emergency room CT scan and CT angiogram were unremarkable.  MRI scan was obtained which showed no acute abnormality.  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  EEG showed right hemispheric slowing but no seizure activity.  Episode.  She will continue aspirin and Plavix which is tolerating well with Carmen [name] bruising.  She is currently doing physical therapy at home.  With her daughter.  The daughter visiting her mother-in-law's suite on her property.  Patient had several falls and developed bruising was felt to be high  risk for Eliquis and had Watchman device done on 12/28/2022 by Dr. Lalla [name] and the procedure went well without any issues.  He has not stopped Eliquis since 4 weeks after the procedure.  She continues to have short-term memory and cognitive difficulties.  She has good days and bad days.  She still remains independent in activities of daily living.  For the shower.  Does not have any delusions, hallucinations, agitation with behaviors.  Daughter manages her medications.  He has no new complaints today. ROS:   14 system review of systems is positive for loss of consciousness, stiffness, slurred speech, facial droop, weakness, gait imbalance, falls, memory loss all other systems negative  PMH:  Past Medical History:  Diagnosis Date   Abnormal CT scan, lumbar spine (05/11/2021) 05/17/2021   (05/11/2021) LUMBAR CT FINDINGS: Alignment: Lumbar levocurvature, apex L4. Mild lateral listhesis L4 on L5 of approximately 3 mm. Vertebrae: Remote appearing superior endplate deformities at T12 with 10% height loss and L1 with up to 20% height loss. Multilevel discogenic and facet degenerative changes. Mild bilateral SI joint arthrosis.  DISC LEVELS: T11-T12: Near complete disc height loss with de   Abnormal MRI, cervical spine (05/12/2021) 05/17/2021   (05/12/2021) CERVICAL MRI FINDINGS: Motion artifact is present. Posterior Fossa, vertebral arteries, paraspinal tissues: Left superior cerebellar infarct.   DISC LEVELS: C2-C3: Disc bulge with endplate osteophytes. Uncovertebral and facet hypertrophy. C3-C4: Disc bulge with endplate osteophytes. Uncovertebral and facet hypertrophy. Mild canal stenosis. Marked foraminal stenosis. C4-C5: Disc bulge w   Allergy    Anemia    Anxiety    Arthritis    Back pain    Coronary artery disease    Depression    History of stroke 10/02/2020   Hypertension    Peripheral vascular disease (HCC)    Personal history of nicotine dependence 10/02/2020   Prsnl hx of TIA (TIA), and cereb  infrc w/o resid deficits 10/02/2020   Stroke [facility])    Thyroid disease     Social History:  Social History   Socioeconomic History   Marital status: Widowed    Spouse name: Not on file   Number of children: 3   Years of education: Not on file   Highest education level: Not on file  Occupational History   Not on file  Tobacco Use   Smoking status: Former    Years: 30    Types: Cigarettes    Quit date: 05/2020    Years since quitting: 2.9   Smokeless tobacco: Never   Tobacco comments:    Smoked about 1 pack per month  Vaping Use   Vaping Use: Never used  Substance and Sexual Activity   Alcohol use: Not Currently   Drug use: Yes    Types: Hydrocodone   Sexual activity: Not Currently  Other Topics Concern   Not on file  Social History Narrative   ** Merged History Encounter **       Social Determinants of Health   Financial Resource Strain: Low Risk  (08/14/2022)   Overall Financial Resource Strain (CARDIA)    Difficulty of Paying  Living Expenses: Not hard at all  Food Insecurity: No Food Insecurity (12/28/2022)   Hunger Vital Sign    Worried About Running Out of Food in the Last Year: Never true    Ran Out of Food in the Last Year: Never true  Transportation Needs: No Transportation Needs (12/28/2022)   PRAPARE - Administrator, Civil Service (Medical): No    Lack of Transportation (Non-Medical): No  Physical Activity: Insufficiently Active (08/14/2022)   Exercise Vital Sign    Days of Exercise per Week: 7 days    Minutes of Exercise per Session: 20 min  Stress: No Stress Concern Present (08/14/2022)   [employer] of Occupational Health - Occupational Stress Questionnaire    Feeling of Stress : Not at all  Social Connections: Not on file  Intimate Partner Violence: Not At Risk (12/28/2022)   Humiliation, Afraid, Rape, and Kick questionnaire    Fear of Current or Ex-Partner: No    Emotionally Abused: No    Physically Abused: No    Sexually  Abused: No    Medications:   Current Outpatient Medications on File Prior to Visit  Medication Sig Dispense Refill   aspirin EC 81 MG tablet Take 81 mg by mouth daily. Swallow whole.     atorvastatin (LIPITOR) 40 MG tablet Take 1 tablet (40 mg total) by mouth at bedtime. 90 tablet 3   CALCIUM PO Take 1 tablet by mouth daily.     Cholecalciferol (VITAMIN D-3) 25 MCG (1000 UT) CAPS Take 1,000 Units by mouth daily.     clopidogrel (PLAVIX) 75 MG tablet Take 1 tablet (75 mg total) by mouth daily. START ON 5/13 90 tablet 3   cyanocobalamin (VITAMIN B12) 500 MCG tablet Take 500 mcg by mouth daily.     diphenhydrAMINE (BENADRYL) 25 MG tablet Take 25 mg by mouth at bedtime as needed for allergies.     divalproex (DEPAKOTE) 500 MG DR tablet Take 1 tablet (500 mg total) by mouth at bedtime. 90 tablet 3   famotidine (PEPCID AC) 10 MG tablet Take 10 mg by mouth daily as needed for heartburn or indigestion.     furosemide (LASIX) 20 MG tablet Take 1 tablet (20 mg total) by mouth daily as needed. 30 tablet 5   HYDROcodone-acetaminophen (NORCO/VICODIN) 5-325 MG tablet Take 1 tablet by mouth 2 (two) times daily as needed for severe pain. Must last 30 days. 60 tablet 0   hydroxychloroquine (PLAQUENIL) 200 MG tablet Take 200 mg by mouth 2 (two) times daily.     levothyroxine (SYNTHROID) 137 MCG tablet Take 1 tablet (137 mcg total) by mouth daily before breakfast. 90 tablet 3   loratadine (CLARITIN) 10 MG tablet Take 1 tablet (10 mg total) by mouth daily. 30 tablet 11   losartan (COZAAR) 25 MG tablet Take 1/2 tablet once daily 45 tablet 3   magnesium oxide (MAG-OX) 400 (240 Mg) MG tablet Take 400 mg by mouth daily.     metoprolol succinate (TOPROL-XL) 100 MG 24 hr tablet TAKE 1/2 TABLET BY MOUTH AT BEDTIME 45 tablet 0   naloxone (NARCAN) nasal spray 4 mg/0.1 mL Place 1 spray into the nose as needed for up to 365 doses (for opioid-induced respiratory depresssion). In case of emergency (overdose), spray once into  each nostril. If no response within 3 minutes, repeat application and call 911. 1 each 0   traZODone (DESYREL) 100 MG tablet Take 1 tablet (100 mg total) by mouth at bedtime as needed  for sleep 90 tablet 0   HYDROcodone-acetaminophen (NORCO/VICODIN) 5-325 MG tablet Take 1 tablet by mouth 2 (two) times daily as needed for severe pain. Must last 30 days. 60 tablet 0   No current facility-administered medications on file prior to visit.    Allergies:  No Known Allergies  Physical Exam General: well developed, well nourished pleasant elderly Caucasian lady, seated, in no evident distress Head: head normocephalic and atraumatic.   Neck: supple with no carotid or supraclavicular bruits Cardiovascular: regular rate and rhythm, no murmurs Musculoskeletal: no deformity Skin:  no rash/petichiae Vascular:  Normal pulses all extremities  Neurologic Exam Mental Status: Awake and fully alert. Oriented to place and time. Recent and remote memory intact. Attention span, concentration and fund of knowledge appropriate. Mood and affect appropriate.  Diminished recall 1/3.  Able to name only 5 animals which can walk on 4 legs.  Clock drawing 3/4.MMSE  26/30 .  Geriatric depression scale is 6 Cranial Nerves: Fundoscopic exam reveals sharp disc margins. Pupils equal, briskly reactive to light. Extraocular movements full without nystagmus. Visual fields full to confrontation. Hearing is diminished bilaterally. Facial sensation intact. Face, tongue, palate moves normally and symmetrically.  Motor: Normal bulk and tone. Normal strength in all tested extremity muscles. Sensory.: intact to touch , pinprick , position and vibratory sensation.  Coordination: Rapid alternating movements normal in all extremities. Finger-to-nose and heel-to-shin performed accurately bilaterally. Gait and Station: Arises from chair without difficulty. Stance is broad-based l.  Uses a cane.  Gait is imbalance particularly while turning   reflexes: 1+ and symmetric. Toes downgoing.      03/29/2023    1:36 PM  MMSE - Mini Mental State Exam  Orientation to time 3  Orientation to Place 5  Registration 3  Attention/ Calculation 5  Recall 3  Language- name 2 objects 2  Language- repeat 0  Language- follow 3 step command 3  Language- read & follow direction 1  Write a sentence 1  Copy design 0  Total score 26        ASSESSMENT: 82 year old Caucasian lady with brief episode of altered consciousness with generalized body stiffness and foaming of the mouth followed by transient facial droop slurred speech and right-sided weakness-strokelike episode unclear as to syncope versus seizure or TIA.  Vascular risk factors of chronic atrial fibrillation s/p Watchman procedure in March 2024, hypertension, hyperlipidemia and mild obesity.  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     PLAN: I had a long discussion with the patient and her daughter regarding her episode of brief unresponsiveness and  weakness and discussed differential diagnosis including TIA versus syncopal event versus seizure and recommend she continue Depakote for prophylaxis.  Continue aspirin or stroke prevention given history recent Watchman device for A-fib. Maintain aggressive risk factor modification with strict control of hypertension with blood pressure goal below 130/90, lipids with LDL cholesterol goal below 70 mg percent and diabetes with hemoglobin A1c goal below 6.5%.  I recommended trial of Namenda for memory loss and also encouraged patient to increase participation in cognitively challenging activities like solving crossword puzzles, playing bridge and sudoku.  We also discussed memory compensation strategies.  I encouraged her to continue ongoing physical therapy and use a cane at all times and discussed fall safety precautions.  In case her memory loss progresses may consider trial of Namenda in the future.  Return for  follow-up in the future in 6 months or call earlier if  necessary. Greater than 50% time during this prolonged 40 Minute consultation visit was spent in counseling and coordination of care about her strokelike event syncopal event and mild cognitive impairment discussion with patient and family and answering questions Delia Heady, MD Note: This document was prepared with digital dictation and possible smart phrase technology. Any transcriptional errors that result from this process are unintentional.

## 2023-04-02 ENCOUNTER — Encounter: Payer: Self-pay | Admitting: Family

## 2023-04-02 ENCOUNTER — Ambulatory Visit (INDEPENDENT_AMBULATORY_CARE_PROVIDER_SITE_OTHER): Payer: Medicare Other | Admitting: Family

## 2023-04-02 VITALS — BP 132/82 | HR 71 | Temp 97.8°F | Ht 60.0 in | Wt 172.0 lb

## 2023-04-02 DIAGNOSIS — R110 Nausea: Secondary | ICD-10-CM | POA: Diagnosis not present

## 2023-04-02 DIAGNOSIS — R12 Heartburn: Secondary | ICD-10-CM

## 2023-04-02 NOTE — Patient Instructions (Addendum)
 ------------------------------------    Increase pepcid to 20 mg twice daily for two weeks.  Take this medication for two weeks, administer 30 minutes prior to breakfast each am. Try to decrease and or avoid spicy foods, fried fatty foods, and also caffeine and chocolate as these can increase heartburn symptoms.   ------------------------------------ Make sure you take this four hours separated from your thyroid medication.    Regards,   Mort Sawyers FNP-C

## 2023-04-02 NOTE — Assessment & Plan Note (Signed)
Increase pepcid to 20 mg twice daily for two weeks then decrease to 20 mg once daily Try to decrease and or avoid spicy foods, fried fatty foods, and also caffeine and chocolate as these can increase heartburn symptoms.  Recommendation to not eat two hours prior to bedtime and elevate head of bed prn No recurrent episodes of blood like sputum, daughter suspects chocolate so will have her report any more episodes and also write down when last chocolate was ingested and if they correlate.

## 2023-04-02 NOTE — Progress Notes (Signed)
Established Patient Office Visit  Subjective:   Patient ID: Carmen [name], female    DOB: Nov 27, 1940  Age: 82 y.o. MRN: [id number]  CC:  Chief Complaint  Patient presents with   Nausea    A couple weeks ago she has had intermittent nausea  Coughing up blood     HPI: Carmen [name] is a 82 y.o. female presenting on 04/02/2023 for Nausea (A couple weeks ago she has had intermittent nausea /Coughing up blood )  Intermittent nausea with some heart burn.   Coughing: often. She sometimes coughs up so much that she is coughing up blood. Daughter states that she thinks she is coughing up chocolate. This was only for three days . She does have allergies right now as well with PND  Daughter states is taking codeine without food at times and she thinks this is contributing to her nausea. Also eats chocolate and coffee daily, she does have increased heartburn especially at night. Currently taking pepcid once nightly over the counter.    Wt Readings from Last 3 Encounters:  04/02/23 172 lb (78 kg)  03/29/23 172 lb 12.8 oz (78.4 kg)  02/21/23 177 lb (80.3 kg)          ROS: Negative unless specifically indicated above in HPI.   Relevant past medical history reviewed and updated as indicated.   Allergies and medications reviewed and updated.   Current Outpatient Medications:    aspirin EC 81 MG tablet, Take 81 mg by mouth daily. Swallow whole., Disp: , Rfl:    atorvastatin (LIPITOR) 40 MG tablet, Take 1 tablet (40 mg total) by mouth at bedtime., Disp: 90 tablet, Rfl: 3   CALCIUM PO, Take 1 tablet by mouth daily., Disp: , Rfl:    Cholecalciferol (VITAMIN D-3) 25 MCG (1000 UT) CAPS, Take 1,000 Units by mouth daily., Disp: , Rfl:    clopidogrel (PLAVIX) 75 MG tablet, Take 1 tablet (75 mg total) by mouth daily. START ON 5/13, Disp: 90 tablet, Rfl: 3   cyanocobalamin (VITAMIN B12) 500 MCG tablet, Take 500 mcg by mouth daily., Disp: , Rfl:    diphenhydrAMINE (BENADRYL) 25 MG tablet, Take 25  mg by mouth at bedtime as needed for allergies., Disp: , Rfl:    divalproex (DEPAKOTE) 500 MG DR tablet, Take 1 tablet (500 mg total) by mouth at bedtime., Disp: 90 tablet, Rfl: 3   famotidine (PEPCID AC) 10 MG tablet, Take 10 mg by mouth daily as needed for heartburn or indigestion., Disp: , Rfl:    furosemide (LASIX) 20 MG tablet, Take 1 tablet (20 mg total) by mouth daily as needed., Disp: 30 tablet, Rfl: 5   HYDROcodone-acetaminophen (NORCO/VICODIN) 5-325 MG tablet, Take 1 tablet by mouth 2 (two) times daily as needed for severe pain. Must last 30 days., Disp: 60 tablet, Rfl: 0   hydroxychloroquine (PLAQUENIL) 200 MG tablet, Take 200 mg by mouth 2 (two) times daily., Disp: , Rfl:    levothyroxine (SYNTHROID) 137 MCG tablet, Take 1 tablet (137 mcg total) by mouth daily before breakfast., Disp: 90 tablet, Rfl: 3   loratadine (CLARITIN) 10 MG tablet, Take 1 tablet (10 mg total) by mouth daily., Disp: 30 tablet, Rfl: 11   losartan (COZAAR) 25 MG tablet, Take 1/2 tablet once daily, Disp: 45 tablet, Rfl: 3   magnesium oxide (MAG-OX) 400 (240 Mg) MG tablet, Take 400 mg by mouth daily., Disp: , Rfl:    metoprolol succinate (TOPROL-XL) 100 MG 24 hr tablet, TAKE 1/2 TABLET BY  MOUTH AT BEDTIME, Disp: 45 tablet, Rfl: 0   naloxone (NARCAN) nasal spray 4 mg/0.1 mL, Place 1 spray into the nose as needed for up to 365 doses (for opioid-induced respiratory depresssion). In case of emergency (overdose), spray once into each nostril. If no response within 3 minutes, repeat application and call 911., Disp: 1 each, Rfl: 0   traZODone (DESYREL) 100 MG tablet, Take 1 tablet (100 mg total) by mouth at bedtime as needed for sleep, Disp: 90 tablet, Rfl: 0   HYDROcodone-acetaminophen (NORCO/VICODIN) 5-325 MG tablet, Take 1 tablet by mouth 2 (two) times daily as needed for severe pain. Must last 30 days., Disp: 60 tablet, Rfl: 0  No Known Allergies  Objective:   BP 132/82   Pulse 71   Temp 97.8 F (36.6 C) (Temporal)    Ht 5' (1.524 m)   Wt 172 lb (78 kg)   SpO2 98%   BMI 33.59 kg/m    Physical Exam Constitutional:      General: She is not in acute distress.    Appearance: Normal appearance. She is normal weight. She is not ill-appearing, toxic-appearing or diaphoretic.  HENT:     Head: Normocephalic.     Right Ear: Tympanic membrane normal.     Left Ear: Tympanic membrane normal.     Nose: Nose normal.     Mouth/Throat:     Mouth: Mucous membranes are dry.     Pharynx: No oropharyngeal exudate or posterior oropharyngeal erythema.  Eyes:     Extraocular Movements: Extraocular movements intact.     Pupils: Pupils are equal, round, and reactive to light.  Cardiovascular:     Rate and Rhythm: Normal rate.     Pulses: Normal pulses.     Heart sounds: Normal heart sounds.  Pulmonary:     Effort: Pulmonary effort is normal.  Abdominal:     General: Abdomen is flat.     Tenderness: There is no abdominal tenderness.  Musculoskeletal:     Cervical back: Normal range of motion.  Neurological:     General: No focal deficit present.     Mental Status: She is alert and oriented to person, place, and time. Mental status is at baseline.  Psychiatric:        Mood and Affect: Mood normal.        Behavior: Behavior normal.        Thought Content: Thought content normal.        Judgment: Judgment normal.     Assessment & Plan:  Heartburn Assessment & Plan: Increase pepcid to 20 mg twice daily for two weeks then decrease to 20 mg once daily Try to decrease and or avoid spicy foods, fried fatty foods, and also caffeine and chocolate as these can increase heartburn symptoms.  Recommendation to not eat two hours prior to bedtime and elevate head of bed prn No recurrent episodes of blood like sputum, daughter suspects chocolate so will have her report any more episodes and also write down when last chocolate was ingested and if they correlate.    Nausea Assessment & Plan: Suspect from taking  medication on an empty stomach. Recommended that pt take with food even if a few small crackers especially her pain medication. Use voltaren topically very sparingly, rather tylenol lidocaine patch, blue emu otc. Biofreeze ok as well.       Follow up plan: Return if symptoms worsen or fail to improve.  Mort Sawyers, FNP

## 2023-04-02 NOTE — Assessment & Plan Note (Signed)
Suspect from taking medication on an empty stomach. Recommended that pt take with food even if a few small crackers especially her pain medication. Use voltaren topically very sparingly, rather tylenol lidocaine patch, blue emu otc. Biofreeze ok as well.

## 2023-04-04 DIAGNOSIS — K080 Exfoliation of teeth due to systemic causes: Secondary | ICD-10-CM | POA: Diagnosis not present

## 2023-04-06 ENCOUNTER — Encounter: Payer: Self-pay | Admitting: Internal Medicine

## 2023-04-06 ENCOUNTER — Ambulatory Visit: Payer: Medicare Other | Attending: Internal Medicine | Admitting: Internal Medicine

## 2023-04-06 VITALS — BP 100/76 | HR 89 | Ht 60.0 in | Wt 174.0 lb

## 2023-04-06 DIAGNOSIS — I4821 Permanent atrial fibrillation: Secondary | ICD-10-CM

## 2023-04-06 DIAGNOSIS — I25810 Atherosclerosis of coronary artery bypass graft(s) without angina pectoris: Secondary | ICD-10-CM | POA: Diagnosis not present

## 2023-04-06 DIAGNOSIS — E785 Hyperlipidemia, unspecified: Secondary | ICD-10-CM

## 2023-04-06 DIAGNOSIS — Z95818 Presence of other cardiac implants and grafts: Secondary | ICD-10-CM

## 2023-04-06 DIAGNOSIS — I10 Essential (primary) hypertension: Secondary | ICD-10-CM

## 2023-04-06 NOTE — Progress Notes (Signed)
Cardiology Office Note:  .   Date:  04/06/2023  ID:  Carmen [name], DOB 06/26/1941, MRN [id number] PCP: Mort Sawyers, FNP  [facility] Providers Cardiologist:  Yvonne Kendall, MD Electrophysiologist:  Lanier Prude, MD     History of Present Illness: .   Carmen [name] is a 82 y.o. female with history of coronary artery disease status post CABG (2005, LIMA-LAD, sequential SVG-D-OM, and SVG-PDA) and subsequent PCI due to failure of multiple grafts (Taxus stents to proximal LAD, mid LCx, and RCA), persistent atrial fibrillation status post left atrial appendage occlusion, stroke, PAD, hypertension, hyperlipidemia, hypothyroidism, anemia, chronic pain syndrome, and depression/anxiety, who presents for follow-up of coronary artery disease and atrial fibrillation.  I last saw her in April, at which time she was recovering well from her watchman implantation in late March.  We did not make any medication changes or pursue additional testing.  She saw her PCP earlier this week complaining of nausea cough, sometimes leading to coughing up blood.  She was advised to increase famotidine to 20 mg twice daily and avoid foods that could trigger her nausea.  She was also encouraged to minimize her use of topical diclofenac.  In hindsight, Ms. Urton and her daughter think that some retained dark chocolate in her esophagus may have looked like blood.  Overall, Ms. Abt reports that she is feeling well from a heart standpoint.  She has recovered from her watchman implantation.  She denies chest pain, palpitations, and lightheadedness.  She notes stable exertional dyspnea when walking uphill but overall feels like she is doing better with regular activity.  Her edema has been minimal and well-controlled over the last few months as well.  She has not had any falls over the last 6 months.  She also denies bleeding.  ROS: See HPI  Studies Reviewed: Marland Kitchen   EKG Interpretation Date/Time:  Friday April 06 2023  08:02:07 EDT Ventricular Rate:  89 PR Interval:    QRS Duration:  80 QT Interval:  362 QTC Calculation: 440 R Axis:   92  Text Interpretation: Atrial fibrillation Rightward axis Nonspecific ST and T wave abnormality When compared with ECG of 04-Jan-2023 QRS axis has changed Otherwise no significant change Confirmed by Darean Rote, Cristal Deer [phone]) on 04/06/2023 8:07:14 AM    TTE (12/20/2022): Technically difficult study due to poor windows.  LVEF 55-60% with mild LVH.  Normal RV size and function.  Mild mitral regurgitation.  Risk Assessment/Calculations:    CHA2DS2-VASc Score = 7  This indicates a 11.2% annual risk of stroke. The patient's score is based upon: CHF History: 0 HTN History: 1 Diabetes History: 0 Stroke History: 2 Vascular Disease History: 1 Age Score: 2 Gender Score: 1   Physical Exam:   VS:  BP 100/76 (BP Location: Left Arm, Patient Position: Sitting, Cuff Size: Normal)   Pulse 89   Ht 5' (1.524 m)   Wt 174 lb (78.9 kg)   SpO2 98%   BMI 33.98 kg/m    Wt Readings from Last 3 Encounters:  04/06/23 174 lb (78.9 kg)  04/02/23 172 lb (78 kg)  03/29/23 172 lb 12.8 oz (78.4 kg)    General:  NAD. Neck: No JVD or HJR. Lungs: Clear to auscultation bilaterally without wheezes or crackles. Heart: Irregularly irregular rhythm without murmurs, rubs, or gallops. Abdomen: Soft, nontender, nondistended. Extremities: Trace edema with varicose veins noted.  ASSESSMENT AND PLAN: .    Permanent atrial fibrillation s/p Watchman: Ventricular rates adequately controlled.  Patient not  on anticoagulation due to recurrent falls but is status post left atrial appendage occlusion in 12/2022 with small residual peri-device leak (~2.5 mm) noted on CTA in May.  She is currently on aspirin and clopidogrel with plans for follow-up in the structural heart clinic in September.  I will reach out to Dr. Lalla Brothers and Ms. Julien Girt to see if antiplatelet therapy could be de-escalated to clopidogrel  alone at this time or if we should defer that until they see her in September.  Continue rate control with metoprolol.  Coronary artery disease: No angina reported.  Nonspecific ST/T changes on EKG are unchanged from prior tracings.  Continue secondary prevention with aspirin and clopidogrel for now (though I would favor de-escalation to indefinite clopidogrel monotherapy when felt appropriate by Dr. Lalla Brothers.  Continue atorvastatin 40 mg daily with repeat lipid panel planned next month.  Hopefully, LDL will have improved and be below 70 with ongoing lifestyle modifications.  Hyperlipidemia: LDL just above goal at 83 on last check in 05/2022.  Continue atorvastatin 40 mg daily with ongoing diet and exercise.  Labs to be repeated next month at annual physical with Ms. Dugal.  If LDL remains above 70, escalation of atorvastatin to 80 mg daily will need to be considered.  Hypertension: Blood pressure low normal today but typically a little bit higher.  No symptoms reported.  Continue current medications.      Dispo: Return to clinic in 6 months.  Signed, Yvonne Kendall, MD

## 2023-04-06 NOTE — Patient Instructions (Signed)
Medication Instructions:  The current medical regimen is effective;  continue present plan and medications.  *If you need a refill on your cardiac medications before your next appointment, please call your pharmacy*   Follow-Up: At [facility], you and your health needs are our priority.  As part of our continuing mission to provide you with exceptional heart care, we have created designated Provider Care Teams.  These Care Teams include your primary Cardiologist (physician) and Advanced Practice Providers (APPs -  Physician Assistants and Nurse Practitioners) who all work together to provide you with the care you need, when you need it.  We recommend signing up for the patient portal called "MyChart".  Sign up information is provided on this After Visit Summary.  MyChart is used to connect with patients for Virtual Visits (Telemedicine).  Patients are able to view lab/test results, encounter notes, upcoming appointments, etc.  Non-urgent messages can be sent to your provider as well.   To learn more about what you can do with MyChart, go to [email or web address].    Your next appointment:   6 month(s)  Provider:   You may see Christopher End, MD or one of the following Advanced Practice Providers on your designated Care Team:   Christopher Berge, NP Ryan Dunn, PA-C Cadence Furth, PA-C Sheri Hammock, NP    

## 2023-04-10 ENCOUNTER — Other Ambulatory Visit: Payer: Self-pay | Admitting: Cardiology

## 2023-04-10 ENCOUNTER — Telehealth: Payer: Self-pay | Admitting: Cardiology

## 2023-04-10 NOTE — Telephone Encounter (Signed)
Good afternoon! Could you please let this patient know that she can stop her ASA and continue on Plavix monotherapy s/p Watchman as discussed with her and Dr. Okey Dupre. She was to make this change

## 2023-04-10 NOTE — Telephone Encounter (Signed)
Good afternoon! Can you please let the patient know that she can stop her ASA and continue Plavix by itself. She was actually suppose to make this change when I saw her in May. She will continue Plavix and we will discuss again at her 6 month post Watchman follow up with myself then end of September.   Thank you so much! Georgie Chard

## 2023-04-10 NOTE — Progress Notes (Unsigned)
PROVIDER NOTE: Information contained herein reflects review and annotations entered in association with encounter. Interpretation of such information and data should be left to medically-trained personnel. Information provided to patient can be located elsewhere in the medical record under "Patient Instructions". Document created using STT-dictation technology, any transcriptional errors that may result from process are unintentional.    Patient: Carmen [name]  Service Category: E/M  Provider: Oswaldo Done, MD  DOB: Mar 13, 1941  DOS: 04/11/2023  Referring Provider: Mort Sawyers, FNP  MRN: [id number]  Specialty: Interventional Pain Management  PCP: Mort Sawyers, FNP  Type: Established Patient  Setting: Ambulatory outpatient    Location: Office  Delivery: Face-to-face     HPI  [name], a 82 y.o. year old female, is here today because of her Chronic pain syndrome [G89.4]. [name] primary complain today is No chief complaint on file.  Pertinent problems: [name] has Peripheral vascular disease (HCC); Chronic low back pain (1ry area of Pain) (Bilateral) (R>L) w/o sciatica; Dorsalgia, unspecified; Chronic pain syndrome; Chronic neck pain (2ry area of Pain) (Bilateral) (L>R); Chronic lower extremity pain (3ry area of Pain) (Bilateral) (R>L); Chronic hand pain (Left); Chronic hand pain (Right); Lumbar compression fracture (Old) (Multilevel), sequela; Chronic cervical radiculopathy (Bilateral); DDD (degenerative disc disease), cervical; Foraminal stenosis of cervical region; Lumbar facet syndrome (Bilateral); Lumbosacral facet hypertrophy (Multilevel) (Bilateral); Lumbar facet arthropathy (Multilevel) (Bilateral); 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HCC); Cervical facet syndrome (Bilateral); Cervical facet hypertrophy (Multilevel) (Bilateral); Spondylosis without myelopathy or radiculopathy, cervical region; Spondylosis without myelopathy or radiculopathy, lumbosacral region;  Other intervertebral disc degeneration, lumbar region; DDD (degenerative disc disease), lumbosacral; Levoscoliosis of lumbar spine; Lumbosacral foraminal stenosis (Multilevel) (Bilateral); Degenerative lateral spondylolisthesis of L4/L5 (3mm); Baastrup's syndrome; Unsteady gait when walking; Chronic hip pain (Right); Greater trochanteric bursitis (Right); Chronic lower extremity pain (Right); Osteoarthritis of hip (Right); Right sided weakness; Pedal edema; and Calcium pyrophosphate arthropathy on their pertinent problem list. Pain Assessment: Severity of   is reported as a  /10. Location:    / . Onset:  . Quality:  . Timing:  . Modifying factor(s):  Marland Kitchen Vitals:  vitals were not taken for this visit.  BMI: Estimated body mass index is 33.98 kg/m as calculated from the following:   Height as of 04/06/23: 5' (1.524 m).   Weight as of 04/06/23: 174 lb (78.9 kg). Last encounter: 01/15/2023. Last procedure: 08/22/2022.  Reason for encounter: medication management. ***  Routine UDS ordered today.   RTCB: 07/20/2023   Pharmacotherapy Assessment  Analgesic: Hydrocodone/APAP 5/325, 1 tab p.o. BID (10 mg/day of hydrocodone) (10 MME) (last filled on 05/02/2021) MME/day: 10 mg/day   Monitoring: [place] PMP: PDMP reviewed during this encounter.       Pharmacotherapy: No side-effects or adverse reactions reported. Compliance: No problems identified. Effectiveness: Clinically acceptable.  No notes on file  No results found for: "CBDTHCR" No results found for: "D8THCCBX" No results found for: "D9THCCBX"  UDS:  Summary  Date Value Ref Range Status  04/18/2022 Note  Final    Comment:    ==================================================================== [facility] (MW) ==================================================================== Test                             Result       Flag       Units  Drug Present and Declared for Prescription Verification   7-aminoclonazepam              111  EXPECTED   ng/mg creat    7-aminoclonazepam is an expected metabolite of clonazepam. Source of    clonazepam is a scheduled prescription medication.    Hydrocodone                    1142         EXPECTED   ng/mg creat   Hydromorphone                  151          EXPECTED   ng/mg creat   Dihydrocodeine                 126          EXPECTED   ng/mg creat   Norhydrocodone                 1954         EXPECTED   ng/mg creat    Sources of hydrocodone include scheduled prescription medications.    Hydromorphone, dihydrocodeine and norhydrocodone are expected    metabolites of hydrocodone. Hydromorphone and dihydrocodeine are    also available as scheduled prescription medications.  ==================================================================== Test                      Result    Flag   Units      Ref Range   Creatinine              57               mg/dL      >=16 ==================================================================== Declared Medications:  The flagging and interpretation on this report are based on the  following declared medications.  Unexpected results may arise from  inaccuracies in the declared medications.   **Note: The testing scope of this panel includes these medications:   Clonazepam (Klonopin)  Hydrocodone (Norco)   **Note: The testing scope of this panel does not include the  following reported medications:   Acetaminophen (Norco)  Apixaban (Eliquis)  Aspirin  Atorvastatin (Lipitor)  Calcium  Cetirizine (Zyrtec)  Hydrochlorothiazide  Levothyroxine (Synthroid)  Metoprolol (Toprol)  Trazodone (Desyrel) ==================================================================== For clinical consultation, please call [phone]. ====================================================================       ROS  Constitutional: Denies any fever or chills Gastrointestinal: No reported hemesis, hematochezia, vomiting, or acute GI  distress Musculoskeletal: Denies any acute onset joint swelling, redness, loss of ROM, or weakness Neurological: No reported episodes of acute onset apraxia, aphasia, dysarthria, agnosia, amnesia, paralysis, loss of coordination, or loss of consciousness  Medication Review  Calcium, HYDROcodone-acetaminophen, Vitamin D-3, aspirin EC, atorvastatin, clopidogrel, cyanocobalamin, diphenhydrAMINE, divalproex, famotidine, furosemide, hydroxychloroquine, levothyroxine, loratadine, losartan, magnesium oxide, metoprolol succinate, naloxone, and traZODone  History Review  Allergy: Ms. Sazama has No Known Allergies. Drug: Ms. Age  reports current drug use. Drug: Hydrocodone. Alcohol:  reports that she does not currently use alcohol. Tobacco:  reports that she quit smoking about 2 years ago. Her smoking use included cigarettes. She has never used smokeless tobacco. Social: Ms. Stehlik  reports that she quit smoking about 2 years ago. Her smoking use included cigarettes. She has never used smokeless tobacco. She reports that she does not currently use alcohol. She reports current drug use. Drug: Hydrocodone. Medical:  has a past medical history of Abnormal CT scan, lumbar spine (05/11/2021) (05/17/2021), Abnormal MRI, cervical spine (05/12/2021) (05/17/2021), Allergy, Anemia, Anxiety, Arthritis, Back pain, Coronary artery disease, Depression, History of stroke (10/02/2020), Hypertension, Peripheral  vascular disease (HCC), Personal history of nicotine dependence (10/02/2020), Prsnl hx of TIA (TIA), and cereb infrc w/o resid deficits (10/02/2020), Stroke [facility]), and Thyroid disease. Surgical: Ms. Marez  has a past surgical history that includes Cardiac catheterization; Coronary angioplasty; Coronary artery bypass graft; back injections; Cataract extraction; LEFT ATRIAL APPENDAGE OCCLUSION (N/A, 12/28/2022); and TEE without cardioversion (N/A, 12/28/2022). Family: family history includes Bipolar disorder in her daughter;  Breast cancer in her mother; Heart attack in her father.  Laboratory Chemistry Profile   Renal Lab Results  Component Value Date   BUN 14 02/02/2023   CREATININE 0.85 02/02/2023   BCR 16 02/02/2023   GFR 67.85 10/18/2021   GFRAA 73 11/22/2020   GFRNONAA >60 10/14/2022    Hepatic Lab Results  Component Value Date   AST 30 10/14/2022   ALT 26 10/14/2022   ALBUMIN 3.8 10/14/2022   ALKPHOS 39 10/14/2022   AMMONIA 19 10/06/2020    Electrolytes Lab Results  Component Value Date   NA 139 02/02/2023   K 4.5 02/02/2023   CL 101 02/02/2023   CALCIUM 9.1 02/02/2023   MG 1.6 06/22/2022    Bone Lab Results  Component Value Date   25OHVITD1 12 (L) 03/14/2021   25OHVITD2 <1.0 03/14/2021   25OHVITD3 12 03/14/2021    Inflammation (CRP: Acute Phase) (ESR: Chronic Phase) Lab Results  Component Value Date   CRP 0.8 03/14/2021   ESRSEDRATE 3 08/01/2022         Note: Above Lab results reviewed.  Recent Imaging Review  CT CARDIAC MORPH/PULM VEIN W/CM&W/O CA SCORE Addendum: ADDENDUM REPORT: 03/03/2023 02:03   EXAM:  OVER-READ INTERPRETATION  CT CHEST   The following report is an over-read performed by radiologist Dr.  Rosette Reveal [facility] on 03/03/2023. This  over-read does not include interpretation of cardiac or coronary  anatomy or pathology. The coronary/cardiac CTA interpretation by the  cardiologist is attached.   COMPARISON:  CT 01/06/2023   FINDINGS:  Vascular: Aortic atherosclerosis. The included aorta is normal in  caliber, tortuous.   Mediastinum/nodes: Calcified right hilar lymph nodes consistent with  prior granulomatous disease. Unremarkable esophagus.   Lungs: No focal airspace disease. Benign calcified granuloma in the  right lower lobe no pleural fluid. The included airways are patent.   Upper abdomen: No acute or unexpected findings.   Musculoskeletal: There are no acute or suspicious osseous  abnormalities. Prior median  sternotomy.   IMPRESSION:  1.  Aortic Atherosclerosis (ICD10-I70.0).  2. Sequela of prior granulomatous disease with calcified right lower  lobe nodule and lymph nodes, benign needing no further imaging  follow-up.   Electronically Signed    By: Narda Rutherford M.D.    On: 03/03/2023 02:03 Narrative: CLINICAL DATA:  Post left atrial appendage closure with 31 mm Watchman FLX device.  EXAM: Cardiac CT/CTA  TECHNIQUE: A non-contrast, gated CT scan was obtained with axial slices of 3 mm through the heart for calcium scoring. Calcium scoring was performed using the Agatston method. A 120 kV prospective, gated, contrast cardiac scan was obtained. Gantry rotation speed was 250 msecs and collimation was 0.6 mm. Nitroglycerin was not given. A delayed scan was obtained to exclude left atrial appendage thrombus. The 3D dataset was reconstructed in 5% intervals of the 25-50% of the R-R cycle. Late systolic phases were analyzed on a dedicated workstation using MPR, MIP, and VRT modes. The patient received 80 cc of contrast.  FINDINGS: Image quality: excellent.  Noise artifact is: Limited.  Left Atrium: The left atrial size is normal severely dilated. There is a small PFO. There is normal pulmonary vein drainage into the left atrium (2 on the right and 2 on the left).  Left Atrial Appendage: A 31 mm Watchman FLX is present in the appendage. There is a small ~2.5 mm peri-device leak present with resultant incomplete closure of the LAA. No device related thrombus.  Coronary Arteries: Calcium scoring not performed due to prior CABG. Normal coronary origin. Right dominance. The study was performed without use of NTG and is insufficient for plaque evaluation. No bypass grafts are visualized. The LIMA is atretic. Stents noted.  Right Atrium: Right atrial size is severely dilated.  Right Ventricle: The right ventricular cavity is within normal limits.  Left Ventricle: The ventricular  cavity size is within normal limits.  Pulmonary Artery: Dilated pulmonary artery suggestive of pulmonary hypertension.  Cardiac valves: The aortic valve is trileaflet without significant calcification. The mitral valve is normal structure without significant calcification.  Aorta: Normal caliber with no significant disease.  Pericardium: Normal thickness with no significant effusion or calcium present.  Extra-cardiac findings: See attached radiology report for non-cardiac structures.  IMPRESSION: 1. A 31 mm Watchman FLX is present in the appendage with a small ~2.5 mm peri-device leak.  2. Severe biatrial enlargement.  3. 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  4. Small PFO.  Gerri Spore T. Flora Lipps, MD  Electronically Signed: By: Lennie Odor M.D. On: 02/26/2023 21:58 Note: Reviewed        Physical Exam  General appearance: Well nourished, well developed, and well hydrated. In no apparent acute distress Mental status: Alert, oriented x 3 (person, place, & time)       Respiratory: No evidence of acute respiratory distress Eyes: PERLA Vitals: There were no vitals taken for this visit. BMI: Estimated body mass index is 33.98 kg/m as calculated from the following:   Height as of 04/06/23: 5' (1.524 m).   Weight as of 04/06/23: 174 lb (78.9 kg). Ideal: Ideal body weight: 45.5 kg (100 lb 4.9 oz) Adjusted ideal body weight: 58.9 kg (129 lb 12.6 oz)  Assessment   Diagnosis Status  1. Chronic pain syndrome   2. Chronic low back pain (1ry area of Pain) (Bilateral) (L>R) w/o sciatica   3. Chronic neck pain (2ry area of Pain) (Bilateral) (L>R)   4. Chronic lower extremity pain (3ry area of Pain) (Bilateral) (L>R)   5. Lumbar facet joint syndrome (Bilateral)   6. Pharmacologic therapy   7. Chronic use of opiate for therapeutic purpose   8. Encounter for medication management   9. Encounter for chronic pain management    Controlled Controlled Controlled    Updated Problems: No problems updated.  Plan of Care  Problem-specific:  No problem-specific Assessment & Plan notes found for this encounter.  Ms. Ambriana Elsea has a current medication list which includes the following long-term medication(s): atorvastatin, calcium, diphenhydramine, divalproex, famotidine, furosemide, hydrocodone-acetaminophen, levothyroxine, loratadine, losartan, metoprolol succinate, and trazodone.  Pharmacotherapy (Medications Ordered): No orders of the defined types were placed in this encounter.  Orders:  No orders of the defined types were placed in this encounter.  Follow-up plan:   No follow-ups on file.      Interventional Therapies  Risk Factors  Considerations:   ELIQUIS + ASA Anticoagulation (Stop: 3 days  Restart: 6 hours)   Planned  Pending:      Under consideration:   Diagnostic/therapeutic cervical ESI  Therapeutic left lumbar facet  RFA #1  Diagnostic/therapeutic right L4-5 LESI #1  Diagnostic/therapeutic right L4 and L5 TFESI #1    Completed:   Diagnostic/Therapeutic right IA Hip & TBI inj. x2 (08/22/2022) (100/100/100/90-95)  Therapeutic right lumbar facet RFA x1 (06/13/2022) (100/100/100/90-100)  Diagnostic right lumbar facet MBB x3 (01/26/2022) (100/100/90/90)  Diagnostic left lumbar facet MBB x3 (01/26/2022) (100/100/90/90)  Diagnostic left cervical facet MBB x1 (06/28/2021) (25/25/0/0)    Therapeutic  Palliative (PRN) options:   Therapeutic lumbar facet MBB        Recent Visits Date Type Provider Dept  01/15/23 Office Visit Delano Metz, MD [facility]  Showing recent visits within past 90 days and meeting all other requirements Future Appointments Date Type Provider Dept  04/11/23 Appointment Delano Metz, MD [facility]  Showing future appointments within next 90 days and meeting all other requirements  I discussed the assessment and treatment plan with the patient. The patient was  provided an opportunity to ask questions and all were answered. The patient agreed with the plan and demonstrated an understanding of the instructions.  Patient advised to call back or seek an in-person evaluation if the symptoms or condition worsens.  Duration of encounter: *** minutes.  Total time on encounter, as per AMA guidelines included both the face-to-face and non-face-to-face time personally spent by the physician and/or other qualified health care professional(s) on the day of the encounter (includes time in activities that require the physician or other qualified health care professional and does not include time in activities normally performed by clinical staff). Physician's time may include the following activities when performed: Preparing to see the patient (e.g., pre-charting review of records, searching for previously ordered imaging, lab work, and nerve conduction tests) Review of prior analgesic pharmacotherapies. Reviewing PMP Interpreting ordered tests (e.g., lab work, imaging, nerve conduction tests) Performing post-procedure evaluations, including interpretation of diagnostic procedures Obtaining and/or reviewing separately obtained history Performing a medically appropriate examination and/or evaluation Counseling and educating the patient/family/caregiver Ordering medications, tests, or procedures Referring and communicating with other health care professionals (when not separately reported) Documenting clinical information in the electronic or other health record Independently interpreting results (not separately reported) and communicating results to the patient/ family/caregiver Care coordination (not separately reported)  Note by: Oswaldo Done, MD Date: 04/11/2023; Time: 7:53 AM

## 2023-04-10 NOTE — Telephone Encounter (Signed)
Left message for patient to call back  

## 2023-04-10 NOTE — Patient Instructions (Signed)
____________________________________________________________________________________________  Opioid Pain Medication Update  To: All patients taking opioid pain medications. (I.e.: hydrocodone, hydromorphone, oxycodone, oxymorphone, morphine, codeine, methadone, tapentadol, tramadol, buprenorphine, fentanyl, etc.)  Re: Updated review of side effects and adverse reactions of opioid analgesics, as well as new information about long term effects of this class of medications.  Direct risks of long-term opioid therapy are not limited to opioid addiction and overdose. Potential medical risks include serious fractures, breathing problems during sleep, hyperalgesia, immunosuppression, chronic constipation, bowel obstruction, myocardial infarction, and tooth decay secondary to xerostomia.  Unpredictable adverse effects that can occur even if you take your medication correctly: Cognitive impairment, respiratory depression, and death. Most people think that if they take their medication "correctly", and "as instructed", that they will be safe. Nothing could be farther from the truth. In reality, a significant amount of recorded deaths associated with the use of opioids has occurred in individuals that had taken the medication for a long time, and were taking their medication correctly. The following are examples of how this can happen: Patient taking his/her medication for a long time, as instructed, without any side effects, is given a certain antibiotic or another unrelated medication, which in turn triggers a "Drug-to-drug interaction" leading to disorientation, cognitive impairment, impaired reflexes, respiratory depression or an untoward event leading to serious bodily harm or injury, including death.  Patient taking his/her medication for a long time, as instructed, without any side effects, develops an acute impairment of liver and/or kidney function. This will lead to a rapid inability of the body to  breakdown and eliminate their pain medication, which will result in effects similar to an "overdose", but with the same medicine and dose that they had always taken. This again may lead to disorientation, cognitive impairment, impaired reflexes, respiratory depression or an untoward event leading to serious bodily harm or injury, including death.  A similar problem will occur with patients as they grow older and their liver and kidney function begins to decrease as part of the aging process.  Background information: Historically, the original case for using long-term opioid therapy to treat chronic noncancer pain was based on safety assumptions that subsequent experience has called into question. In 1996, the American Pain Society and the American Academy of Pain Medicine issued a consensus statement supporting long-term opioid therapy. This statement acknowledged the dangers of opioid prescribing but concluded that the risk for addiction was low; respiratory depression induced by opioids was short-lived, occurred mainly in opioid-naive patients, and was antagonized by pain; tolerance was not a common problem; and efforts to control diversion should not constrain opioid prescribing. This has now proven to be wrong. Experience regarding the risks for opioid addiction, misuse, and overdose in community practice has failed to support these assumptions.  According to the Centers for Disease Control and Prevention, fatal overdoses involving opioid analgesics have increased sharply over the past decade. Currently, more than 96,700 people die from drug overdoses every year. Opioids are a factor in 7 out of every 10 overdose deaths. Deaths from drug overdose have surpassed motor vehicle accidents as the leading cause of death for individuals between the ages of 35 and 54.  Clinical data suggest that neuroendocrine dysfunction may be very common in both men and women, potentially causing hypogonadism, erectile  dysfunction, infertility, decreased libido, osteoporosis, and depression. Recent studies linked higher opioid dose to increased opioid-related mortality. Controlled observational studies reported that long-term opioid therapy may be associated with increased risk for cardiovascular events. Subsequent meta-analysis concluded   that the safety of long-term opioid therapy in elderly patients has not been proven.   Side Effects and adverse reactions: Common side effects: Drowsiness (sedation). Dizziness. Nausea and vomiting. Constipation. Physical dependence -- Dependence often manifests with withdrawal symptoms when opioids are discontinued or decreased. Tolerance -- As you take repeated doses of opioids, you require increased medication to experience the same effect of pain relief. Respiratory depression -- This can occur in healthy people, especially with higher doses. However, people with COPD, asthma or other lung conditions may be even more susceptible to fatal respiratory impairment.  Uncommon side effects: An increased sensitivity to feeling pain and extreme response to pain (hyperalgesia). Chronic use of opioids can lead to this. Delayed gastric emptying (the process by which the contents of your stomach are moved into your small intestine). Muscle rigidity. Immune system and hormonal dysfunction. Quick, involuntary muscle jerks (myoclonus). Arrhythmia. Itchy skin (pruritus). Dry mouth (xerostomia).  Long-term side effects: Chronic constipation. Sleep-disordered breathing (SDB). Increased risk of bone fractures. Hypothalamic-pituitary-adrenal dysregulation. Increased risk of overdose.  RISKS: Respiratory depression and death: Opioids increase the risk of respiratory depression and death.  Drug-to-drug interactions: Opioids are relatively contraindicated in combination with benzodiazepines, sleep inducers, and other central nervous system depressants. Other classes of medications  (i.e.: certain antibiotics and even over-the-counter medications) may also trigger or induce respiratory depression in some patients.  Medical conditions: Patients with pre-existing respiratory problems are at higher risk of respiratory failure and/or depression when in combination with opioid analgesics. Opioids are relatively contraindicated in some medical conditions such as central sleep apnea.   Fractures and Falls:  Opioids increase the risk and incidence of falls. This is of particular importance in elderly patients.  Endocrine System:  Long-term administration is associated with endocrine abnormalities (endocrinopathies). (Also known as Opioid-induced Endocrinopathy) Influences on both the hypothalamic-pituitary-adrenal axis?and the hypothalamic-pituitary-gonadal axis have been demonstrated with consequent hypogonadism and adrenal insufficiency in both sexes. Hypogonadism and decreased levels of dehydroepiandrosterone sulfate have been reported in men and women. Endocrine effects include: Amenorrhoea in women (abnormal absence of menstruation) Reduced libido in both sexes Decreased sexual function Erectile dysfunction in men Hypogonadisms (decreased testicular function with shrinkage of testicles) Infertility Depression and fatigue Loss of muscle mass Anxiety Depression Immune suppression Hyperalgesia Weight gain Anemia Osteoporosis Patients (particularly women of childbearing age) should avoid opioids. There is insufficient evidence to recommend routine monitoring of asymptomatic patients taking opioids in the long-term for hormonal deficiencies.  Immune System: Human studies have demonstrated that opioids have an immunomodulating effect. These effects are mediated via opioid receptors both on immune effector cells and in the central nervous system. Opioids have been demonstrated to have adverse effects on antimicrobial response and anti-tumour surveillance. Buprenorphine has  been demonstrated to have no impact on immune function.  Opioid Induced Hyperalgesia: Human studies have demonstrated that prolonged use of opioids can lead to a state of abnormal pain sensitivity, sometimes called opioid induced hyperalgesia (OIH). Opioid induced hyperalgesia is not usually seen in the absence of tolerance to opioid analgesia. Clinically, hyperalgesia may be diagnosed if the patient on long-term opioid therapy presents with increased pain. This might be qualitatively and anatomically distinct from pain related to disease progression or to breakthrough pain resulting from development of opioid tolerance. Pain associated with hyperalgesia tends to be more diffuse than the pre-existing pain and less defined in quality. Management of opioid induced hyperalgesia requires opioid dose reduction.  Cancer: Chronic opioid therapy has been associated with an increased risk of cancer   among noncancer patients with chronic pain. This association was more evident in chronic strong opioid users. Chronic opioid consumption causes significant pathological changes in the small intestine and colon. Epidemiological studies have found that there is a link between opium dependence and initiation of gastrointestinal cancers. Cancer is the second leading cause of death after cardiovascular disease. Chronic use of opioids can cause multiple conditions such as GERD, immunosuppression and renal damage as well as carcinogenic effects, which are associated with the incidence of cancers.   Mortality: Long-term opioid use has been associated with increased mortality among patients with chronic non-cancer pain (CNCP).  Prescription of long-acting opioids for chronic noncancer pain was associated with a significantly increased risk of all-cause mortality, including deaths from causes other than overdose.  Reference: Von Korff M, Kolodny A, Deyo RA, Chou R. Long-term opioid therapy reconsidered. Ann Intern Med. 2011  Sep 6;155(5):325-8. doi: 10.7326/[id number]. PMID: 21893626; PMCID: PMC3280085. Bedson J, Chen Y, Ashworth J, Hayward RA, Dunn KM, Jordan KP. Risk of adverse events in patients prescribed long-term opioids: A cohort study in the [place] Clinical Practice Research Datalink. Eur J Pain. 2019 May;23(5):908-922. doi: 10.1002/ejp.1357. Epub 2019 Jan 31. PMID: 30620116. Colameco S, Coren JS, Ciervo CA. Continuous opioid treatment for chronic noncancer pain: a time for moderation in prescribing. Postgrad Med. 2009 Jul;121(4):61-6. doi: 10.3810/pgm.2009.07.2032. PMID: 19641271. Chou R, Turner JA, Devine EB, Hansen RN, Sullivan SD, Blazina I, Dana T, Bougatsos C, Deyo RA. The effectiveness and risks of long-term opioid therapy for chronic pain: a systematic review for a National Institutes of Health Pathways to Prevention Workshop. Ann Intern Med. 2015 Feb 17;162(4):276-86. doi: 10.7326/M14-2559. PMID: 25581257. Warner M, Chen LH, Makuc DM. NCHS Data Brief No. 22. [place]: Centers for Disease Control and Prevention; 2009. Sep, Increase in Fatal Poisonings Involving Opioid Analgesics in the [place], 1999-2006. Song IA, Choi HR, Oh TK. Long-term opioid use and mortality in patients with chronic non-cancer pain: Ten-year follow-up study in [place] from 2010 through 2019. EClinicalMedicine. 2022 Jul 18;51:101558. doi: 10.1016/j.eclinm.2022.101558. PMID: 35875817; PMCID: PMC9304910. Huser, W., Schubert, T., Vogelmann, T. et al. All-cause mortality in patients with long-term opioid therapy compared with non-opioid analgesics for chronic non-cancer pain: a database study. BMC Med 18, 162 (2020). https://doi.org/10.1186/[email or web address] Rashidian H, Zendehdel K, Kamangar F, Malekzadeh R, Haghdoost AA. An Ecological Study of the Association between Opiate Use and Incidence of Cancers. Addict Health. 2016 Fall;8(4):252-260. PMID: 28819556; PMCID: PMC5554805.  Our Goal: Our goal is to control your  pain with means other than the use of opioid pain medications.  Our Recommendation: Talk to your physician about coming off of these medications. We can assist you with the tapering down and stopping these medicines. Based on the new information, even if you cannot completely stop the medication, a decrease in the dose may be associated with a lesser risk. Ask for other means of controlling the pain. Decrease or eliminate those factors that significantly contribute to your pain such as smoking, obesity, and a diet heavily tilted towards "inflammatory" nutrients.  Last Updated: 04/09/2023   ____________________________________________________________________________________________     ____________________________________________________________________________________________  Transfer of Pain Medication between Pharmacies  Re: 2023 DEA Clarification on existing regulation  Published on DEA Website: June 02, 2022  Title: Revised Regulation Allows DEA-Registered Pharmacies to Transfer Electronic Prescriptions at a Patient's Request DEA Headquarters Division - Public Information Office  "Patients now have the ability to request their electronic prescription be transferred to another pharmacy without having to go back to their practitioner to initiate the   request. This revised regulation went into effect on Monday, May 29, 2022.     At a patient's request, a DEA-registered retail pharmacy can now transfer an electronic prescription for a controlled substance (schedules II-V) to another DEA-registered retail pharmacy. Prior to this change, patients would have to go through their practitioner to cancel their prescription and have it re-issued to a different pharmacy. The process was taxing and time consuming for both patients and practitioners.    The Drug Enforcement Administration (DEA) published its intent to revise the process for transferring electronic prescriptions on August 20, 2020.  The final rule was published in the federal register on April 27, 2022 and went into effect 30 days later.  Under the final rule, a prescription can only be transferred once between pharmacies, and only if allowed under existing state or other applicable law. The prescription must remain in its electronic form; may not be altered in any way; and the transfer must be communicated directly between two licensed pharmacists. It's important to note, any authorized refills transfer with the original prescription, which means the entire prescription will be filled at the same pharmacy."    REFERENCES: 1. DEA website announcement https://www.dea.gov/stories/2023/2023-06/2022-09-01/revised-regulation-allows-dea-registered-pharmacies-transfer  2. Department of Justice website  https://www.govinfo.gov/content/pkg/FR-2022-04-27/pdf/2023-15847.pdf  3. DEPARTMENT OF JUSTICE Drug Enforcement Administration 21 CFR Part 1306 [Docket No. DEA-637] RIN [id number]-AB64 "Transfer of Electronic Prescriptions for Schedules II-V Controlled Substances Between Pharmacies for Initial Filling"  ____________________________________________________________________________________________     _______________________________________________________________________  Medication Rules  Purpose: To inform patients, and their family members, of our medication rules and regulations.  Applies to: All patients receiving prescriptions from our practice (written or electronic).  Pharmacy of record: This is the pharmacy where your electronic prescriptions will be sent. Make sure we have the correct one.  Electronic prescriptions: In compliance with the [place] Strengthen Opioid Misuse Prevention (STOP) Act of 2017 (Session Law 2017-74/H243), effective October 02, 2018, all controlled substances must be electronically prescribed. Written prescriptions, faxing, or calling prescriptions to a pharmacy will no longer be  done.  Prescription refills: These will be provided only during in-person appointments. No medications will be renewed without a "face-to-face" evaluation with your provider. Applies to all prescriptions.  NOTE: The following applies primarily to controlled substances (Opioid* Pain Medications).   Type of encounter (visit): For patients receiving controlled substances, face-to-face visits are required. (Not an option and not up to the patient.)  Patient's responsibilities: Pain Pills: Bring all pain pills to every appointment (except for procedure appointments). Pill Bottles: Bring pills in original pharmacy bottle. Bring bottle, even if empty. Always bring the bottle of the most recent fill.  Medication refills: You are responsible for knowing and keeping track of what medications you are taking and when is it that you will need a refill. The day before your appointment: write a list of all prescriptions that need to be refilled. The day of the appointment: give the list to the admitting nurse. Prescriptions will be written only during appointments. No prescriptions will be written on procedure days. If you forget a medication: it will not be "Called in", "Faxed", or "electronically sent". You will need to get another appointment to get these prescribed. No early refills. Do not call asking to have your prescription filled early. Partial  or short prescriptions: Occasionally your pharmacy may not have enough pills to fill your prescription.  NEVER ACCEPT a partial fill or a prescription that is short of the total amount of pills that you were prescribed.    With controlled substances the law allows 72 hours for the pharmacy to complete the prescription.  If the prescription is not completed within 72 hours, the pharmacist will require a new prescription to be written. This means that you will be short on your medicine and we WILL NOT send another prescription to complete your original prescription.   Instead, request the pharmacy to send a carrier to a nearby branch to get enough medication to provide you with your full prescription. Prescription Accuracy: You are responsible for carefully inspecting your prescriptions before leaving our office. Have the discharge nurse carefully go over each prescription with you, before taking them home. Make sure that your name is accurately spelled, that your address is correct. Check the name and dose of your medication to make sure it is accurate. Check the number of pills, and the written instructions to make sure they are clear and accurate. Make sure that you are given enough medication to last until your next medication refill appointment. Taking Medication: Take medication as prescribed. When it comes to controlled substances, taking less pills or less frequently than prescribed is permitted and encouraged. Never take more pills than instructed. Never take the medication more frequently than prescribed.  Inform other Doctors: Always inform, all of your healthcare providers, of all the medications you take. Pain Medication from other Providers: You are not allowed to accept any additional pain medication from any other Doctor or Healthcare provider. There are two exceptions to this rule. (see below) In the event that you require additional pain medication, you are responsible for notifying us, as stated below. Cough Medicine: Often these contain an opioid, such as codeine or hydrocodone. Never accept or take cough medicine containing these opioids if you are already taking an opioid* medication. The combination may cause respiratory failure and death. Medication Agreement: You are responsible for carefully reading and following our Medication Agreement. This must be signed before receiving any prescriptions from our practice. Safely store a copy of your signed Agreement. Violations to the Agreement will result in no further prescriptions. (Additional copies of  our Medication Agreement are available upon request.) Laws, Rules, & Regulations: All patients are expected to follow all Federal and State Laws, Statutes, Rules, & Regulations. Ignorance of the Laws does not constitute a valid excuse.  Illegal drugs and Controlled Substances: The use of illegal substances (including, but not limited to marijuana and its derivatives) and/or the illegal use of any controlled substances is strictly prohibited. Violation of this rule may result in the immediate and permanent discontinuation of any and all prescriptions being written by our practice. The use of any illegal substances is prohibited. Adopted CDC guidelines & recommendations: Target dosing levels will be at or below 60 MME/day. Use of benzodiazepines** is not recommended.  Exceptions: There are only two exceptions to the rule of not receiving pain medications from other Healthcare Providers. Exception #1 (Emergencies): In the event of an emergency (i.e.: accident requiring emergency care), you are allowed to receive additional pain medication. However, you are responsible for: As soon as you are able, call our office [phone], at any time of the day or night, and leave a message stating your name, the date and nature of the emergency, and the name and dose of the medication prescribed. In the event that your call is answered by a member of our staff, make sure to document and save the date, time, and the name of the person that took your information.  Exception #2 (  Planned Surgery): In the event that you are scheduled by another doctor or dentist to have any type of surgery or procedure, you are allowed (for a period no longer than 30 days), to receive additional pain medication, for the acute post-op pain. However, in this case, you are responsible for picking up a copy of our "Post-op Pain Management for Surgeons" handout, and giving it to your surgeon or dentist. This document is available at our office,  and does not require an appointment to obtain it. Simply go to our office during business hours (Monday-Thursday from 8:00 AM to 4:00 PM) (Friday 8:00 AM to 12:00 Noon) or if you have a scheduled appointment with us, prior to your surgery, and ask for it by name. In addition, you are responsible for: calling our office [phone], at any time of the day or night, and leaving a message stating your name, name of your surgeon, type of surgery, and date of procedure or surgery. Failure to comply with your responsibilities may result in termination of therapy involving the controlled substances. Medication Agreement Violation. Following the above rules, including your responsibilities will help you in avoiding a Medication Agreement Violation ("Breaking your Pain Medication Contract").  Consequences:  Not following the above rules may result in permanent discontinuation of medication prescription therapy.  *Opioid medications include: morphine, codeine, oxycodone, oxymorphone, hydrocodone, hydromorphone, meperidine, tramadol, tapentadol, buprenorphine, fentanyl, methadone. **Benzodiazepine medications include: diazepam (Valium), alprazolam (Xanax), clonazepam (Klonopine), lorazepam (Ativan), clorazepate (Tranxene), chlordiazepoxide (Librium), estazolam (Prosom), oxazepam (Serax), temazepam (Restoril), triazolam (Halcion) (Last updated: 07/25/2022) ______________________________________________________________________    ______________________________________________________________________  Medication Recommendations and Reminders  Applies to: All patients receiving prescriptions (written and/or electronic).  Medication Rules & Regulations: You are responsible for reading, knowing, and following our "Medication Rules" document. These exist for your safety and that of others. They are not flexible and neither are we. Dismissing or ignoring them is an act of "non-compliance" that may result in  complete and irreversible termination of such medication therapy. For safety reasons, "non-compliance" will not be tolerated. As with the [place] fundamental legal principle of "ignorance of the law is no defense", we will accept no excuses for not having read and knowing the content of documents provided to you by our practice.  Pharmacy of record:  Definition: This is the pharmacy where your electronic prescriptions will be sent.  We do not endorse any particular pharmacy. It is up to you and your insurance to decide what pharmacy to use.  We do not restrict you in your choice of pharmacy. However, once we write for your prescriptions, we will NOT be re-sending more prescriptions to fix restricted supply problems created by your pharmacy, or your insurance.  The pharmacy listed in the electronic medical record should be the one where you want electronic prescriptions to be sent. If you choose to change pharmacy, simply notify our nursing staff. Changes will be made only during your regular appointments and not over the phone.  Recommendations: Keep all of your pain medications in a safe place, under lock and key, even if you live alone. We will NOT replace lost, stolen, or damaged medication. We do not accept "Police Reports" as proof of medications having been stolen. After you fill your prescription, take 1 week's worth of pills and put them away in a safe place. You should keep a separate, properly labeled bottle for this purpose. The remainder should be kept in the original bottle. Use this as your primary supply, until it runs out.   Once it's gone, then you know that you have 1 week's worth of medicine, and it is time to come in for a prescription refill. If you do this correctly, it is unlikely that you will ever run out of medicine. To make sure that the above recommendation works, it is very important that you make sure your medication refill appointments are scheduled at least 1 week before you  run out of medicine. To do this in an effective manner, make sure that you do not leave the office without scheduling your next medication management appointment. Always ask the nursing staff to show you in your prescription , when your medication will be running out. Then arrange for the receptionist to get you a return appointment, at least 7 days before you run out of medicine. Do not wait until you have 1 or 2 pills left, to come in. This is very poor planning and does not take into consideration that we may need to cancel appointments due to bad weather, sickness, or emergencies affecting our staff. DO NOT ACCEPT A "Partial Fill": If for any reason your pharmacy does not have enough pills/tablets to completely fill or refill your prescription, do not allow for a "partial fill". The law allows the pharmacy to complete that prescription within 72 hours, without requiring a new prescription. If they do not fill the rest of your prescription within those 72 hours, you will need a separate prescription to fill the remaining amount, which we will NOT provide. If the reason for the partial fill is your insurance, you will need to talk to the pharmacist about payment alternatives for the remaining tablets, but again, DO NOT ACCEPT A PARTIAL FILL, unless you can trust your pharmacist to obtain the remainder of the pills within 72 hours.  Prescription refills and/or changes in medication(s):  Prescription refills, and/or changes in dose or medication, will be conducted only during scheduled medication management appointments. (Applies to both, written and electronic prescriptions.) No refills on procedure days. No medication will be changed or started on procedure days. No changes, adjustments, and/or refills will be conducted on a procedure day. Doing so will interfere with the diagnostic portion of the procedure. No phone refills. No medications will be "called into the pharmacy". No Fax refills. No weekend  refills. No Holliday refills. No after hours refills.  Remember:  Business hours are:  Monday to Thursday 8:00 AM to 4:00 PM Provider's Schedule: Jarold Macomber, MD - Appointments are:  Medication management: Monday and Wednesday 8:00 AM to 4:00 PM Procedure day: Tuesday and Thursday 7:30 AM to 4:00 PM Bilal Lateef, MD - Appointments are:  Medication management: Tuesday and Thursday 8:00 AM to 4:00 PM Procedure day: Monday and Wednesday 7:30 AM to 4:00 PM (Last update: 07/25/2022) ______________________________________________________________________   ____________________________________________________________________________________________  Naloxone Nasal Spray  Why am I receiving this medication? [place] STOP ACT requires that all patients taking high dose opioids or at risk of opioids respiratory depression, be prescribed an opioid reversal agent, such as Naloxone (AKA: Narcan).  What is this medication? NALOXONE (nal OX one) treats opioid overdose, which causes slow or shallow breathing, severe drowsiness, or trouble staying awake. Call emergency services after using this medication. You may need additional treatment. Naloxone works by reversing the effects of opioids. It belongs to a group of medications called opioid blockers.  COMMON BRAND NAME(S): Kloxxado, Narcan  What should I tell my care team before I take this medication? They need to know if you have   any of these conditions: Heart disease Substance use disorder An unusual or allergic reaction to naloxone, other medications, foods, dyes, or preservatives Pregnant or trying to get pregnant Breast-feeding  When to use this medication? This medication is to be used for the treatment of respiratory depression (less than 8 breaths per minute) secondary to opioid overdose.   How to use this medication? This medication is for use in the nose. Lay the person on their back. Support their neck with your hand  and allow the head to tilt back before giving the medication. The nasal spray should be given into 1 nostril. After giving the medication, move the person onto their side. Do not remove or test the nasal spray until ready to use. Get emergency medical help right away after giving the first dose of this medication, even if the person wakes up. You should be familiar with how to recognize the signs and symptoms of a narcotic overdose. If more doses are needed, give the additional dose in the other nostril. Talk to your care team about the use of this medication in children. While this medication may be prescribed for children as young as newborns for selected conditions, precautions do apply.  Naloxone Overdosage: If you think you have taken too much of this medicine contact a poison control center or emergency room at once.  NOTE: This medicine is only for you. Do not share this medicine with others.  What if I miss a dose? This does not apply.  What may interact with this medication? This is only used during an emergency. No interactions are expected during emergency use. This list may not describe all possible interactions. Give your health care provider a list of all the medicines, herbs, non-prescription drugs, or dietary supplements you use. Also tell them if you smoke, drink alcohol, or use illegal drugs. Some items may interact with your medicine.  What should I watch for while using this medication? Keep this medication ready for use in the case of an opioid overdose. Make sure that you have the phone number of your care team and local hospital ready. You may need to have additional doses of this medication. Each nasal spray contains a single dose. Some emergencies may require additional doses. After use, bring the treated person to the nearest hospital or call 911. Make sure the treating care team knows that the person has received a dose of this medication. You will receive additional  instructions on what to do during and after use of this medication before an emergency occurs.  What side effects may I notice from receiving this medication? Side effects that you should report to your care team as soon as possible: Allergic reactions--skin rash, itching, hives, swelling of the face, lips, tongue, or throat Side effects that usually do not require medical attention (report these to your care team if they continue or are bothersome): Constipation Dryness or irritation inside the nose Headache Increase in blood pressure Muscle spasms Stuffy nose Toothache This list may not describe all possible side effects. Call your doctor for medical advice about side effects. You may report side effects to FDA at 1-800-FDA-1088.  Where should I keep my medication? Because this is an emergency medication, you should keep it with you at all times.  Keep out of the reach of children and pets. Store between 20 and 25 degrees C (68 and 77 degrees F). Do not freeze. Throw away any unused medication after the expiration date. Keep in original box   until ready to use.  NOTE: This sheet is a summary. It may not cover all possible information. If you have questions about this medicine, talk to your doctor, pharmacist, or health care provider.   2023 Elsevier/Gold Standard (2021-05-27 00:00:00)  ____________________________________________________________________________________________   

## 2023-04-11 ENCOUNTER — Ambulatory Visit: Payer: Medicare Other | Attending: Pain Medicine | Admitting: Pain Medicine

## 2023-04-11 ENCOUNTER — Encounter: Payer: Self-pay | Admitting: Pain Medicine

## 2023-04-11 VITALS — BP 120/87 | HR 77 | Temp 97.8°F | Resp 18 | Ht 60.0 in | Wt 174.0 lb

## 2023-04-11 DIAGNOSIS — M47816 Spondylosis without myelopathy or radiculopathy, lumbar region: Secondary | ICD-10-CM | POA: Diagnosis not present

## 2023-04-11 DIAGNOSIS — M79605 Pain in left leg: Secondary | ICD-10-CM | POA: Diagnosis not present

## 2023-04-11 DIAGNOSIS — G8929 Other chronic pain: Secondary | ICD-10-CM | POA: Insufficient documentation

## 2023-04-11 DIAGNOSIS — Z79891 Long term (current) use of opiate analgesic: Secondary | ICD-10-CM | POA: Diagnosis not present

## 2023-04-11 DIAGNOSIS — G894 Chronic pain syndrome: Secondary | ICD-10-CM | POA: Insufficient documentation

## 2023-04-11 DIAGNOSIS — M79604 Pain in right leg: Secondary | ICD-10-CM | POA: Insufficient documentation

## 2023-04-11 DIAGNOSIS — M545 Low back pain, unspecified: Secondary | ICD-10-CM | POA: Insufficient documentation

## 2023-04-11 DIAGNOSIS — Z79899 Other long term (current) drug therapy: Secondary | ICD-10-CM | POA: Diagnosis not present

## 2023-04-11 DIAGNOSIS — M542 Cervicalgia: Secondary | ICD-10-CM | POA: Insufficient documentation

## 2023-04-11 MED ORDER — HYDROCODONE-ACETAMINOPHEN 5-325 MG PO TABS: 1.0000 | ORAL_TABLET | Freq: Two times a day (BID) | ORAL | 0 refills | Status: DC | PRN

## 2023-04-11 NOTE — Progress Notes (Signed)
Nursing Pain Medication Assessment:  Safety precautions to be maintained throughout the outpatient stay will include: orient to surroundings, keep bed in low position, maintain call bell within reach at all times, provide assistance with transfer out of bed and ambulation.  Medication Inspection Compliance: Pill count conducted under aseptic conditions, in front of the patient. Neither the pills nor the bottle was removed from the patient's sight at any time. Once count was completed pills were immediately returned to the patient in their original bottle.  Medication: Hydrocodone/APAP Pill/Patch Count:  8 of 60 pills remain Pill/Patch Appearance: Markings consistent with prescribed medication Bottle Appearance: Standard pharmacy container. Clearly labeled. Filled Date: 06 / 04 / 2024 Last Medication intake:  Today

## 2023-04-12 ENCOUNTER — Other Ambulatory Visit: Payer: Self-pay | Admitting: Family

## 2023-04-12 DIAGNOSIS — G4700 Insomnia, unspecified: Secondary | ICD-10-CM

## 2023-04-13 LAB — TOXASSURE SELECT 13 (MW), URINE

## 2023-04-17 NOTE — Telephone Encounter (Signed)
Left detailed message on patient's voicemail with the following message as outlined in patient's designated party release.  Good afternoon! Can you please let the patient know that she can stop her ASA and continue Plavix by itself. She was actually suppose to make this change when I saw her in May. She will continue Plavix and we will discuss again at her 6 month post Watchman follow up with myself then end of September.    Thank you so much! Georgie Chard

## 2023-04-18 DIAGNOSIS — Z79899 Other long term (current) drug therapy: Secondary | ICD-10-CM | POA: Diagnosis not present

## 2023-04-18 DIAGNOSIS — M1180 Other specified crystal arthropathies, unspecified site: Secondary | ICD-10-CM | POA: Diagnosis not present

## 2023-05-17 ENCOUNTER — Encounter (INDEPENDENT_AMBULATORY_CARE_PROVIDER_SITE_OTHER): Payer: Self-pay

## 2023-05-29 ENCOUNTER — Other Ambulatory Visit: Payer: Self-pay | Admitting: Internal Medicine

## 2023-05-31 ENCOUNTER — Other Ambulatory Visit: Payer: Self-pay | Admitting: Internal Medicine

## 2023-06-14 ENCOUNTER — Ambulatory Visit (INDEPENDENT_AMBULATORY_CARE_PROVIDER_SITE_OTHER): Payer: Medicare Other | Admitting: Family

## 2023-06-14 ENCOUNTER — Encounter: Payer: Self-pay | Admitting: Family

## 2023-06-14 VITALS — BP 124/84 | HR 81 | Temp 98.0°F | Ht 60.0 in | Wt 166.6 lb

## 2023-06-14 DIAGNOSIS — R2681 Unsteadiness on feet: Secondary | ICD-10-CM

## 2023-06-14 DIAGNOSIS — F329 Major depressive disorder, single episode, unspecified: Secondary | ICD-10-CM | POA: Diagnosis not present

## 2023-06-14 DIAGNOSIS — Z5181 Encounter for therapeutic drug level monitoring: Secondary | ICD-10-CM

## 2023-06-14 DIAGNOSIS — I10 Essential (primary) hypertension: Secondary | ICD-10-CM

## 2023-06-14 DIAGNOSIS — Z23 Encounter for immunization: Secondary | ICD-10-CM | POA: Diagnosis not present

## 2023-06-14 DIAGNOSIS — E785 Hyperlipidemia, unspecified: Secondary | ICD-10-CM | POA: Diagnosis not present

## 2023-06-14 DIAGNOSIS — R5383 Other fatigue: Secondary | ICD-10-CM | POA: Diagnosis not present

## 2023-06-14 DIAGNOSIS — E039 Hypothyroidism, unspecified: Secondary | ICD-10-CM | POA: Diagnosis not present

## 2023-06-14 DIAGNOSIS — R531 Weakness: Secondary | ICD-10-CM

## 2023-06-14 DIAGNOSIS — Z87898 Personal history of other specified conditions: Secondary | ICD-10-CM

## 2023-06-14 LAB — VITAMIN B12: Vitamin B-12: 510 pg/mL (ref 211–911)

## 2023-06-14 LAB — LIPID PANEL
Cholesterol: 93 mg/dL (ref 0–200)
HDL: 46.5 mg/dL (ref >39.00)
LDL Cholesterol: 22 mg/dL (ref 0–99)
NonHDL: 46.79
Total CHOL/HDL Ratio: 2
Triglycerides: 125 mg/dL (ref 0.0–149.0)
VLDL: 25 mg/dL (ref 0.0–40.0)

## 2023-06-14 LAB — CBC WITH DIFFERENTIAL/PLATELET
Basophils Absolute: 0 10*3/uL (ref 0.0–0.1)
Basophils Relative: 0.4 % (ref 0.0–3.0)
Eosinophils Absolute: 0.1 10*3/uL (ref 0.0–0.7)
Eosinophils Relative: 1.6 % (ref 0.0–5.0)
HCT: 36.1 % (ref 36.0–46.0)
Hemoglobin: 11.6 g/dL — ABNORMAL LOW (ref 12.0–15.0)
Lymphocytes Relative: 20 % (ref 12.0–46.0)
Lymphs Abs: 1.4 10*3/uL (ref 0.7–4.0)
MCHC: 32.2 g/dL (ref 30.0–36.0)
MCV: 85 fl (ref 78.0–100.0)
Monocytes Absolute: 0.8 10*3/uL (ref 0.1–1.0)
Monocytes Relative: 11.4 % (ref 3.0–12.0)
Neutro Abs: 4.6 10*3/uL (ref 1.4–7.7)
Neutrophils Relative %: 66.6 % (ref 43.0–77.0)
Platelets: 207 10*3/uL (ref 150.0–400.0)
RBC: 4.25 Mil/uL (ref 3.87–5.11)
RDW: 16 % — ABNORMAL HIGH (ref 11.5–15.5)
WBC: 6.9 10*3/uL (ref 4.0–10.5)

## 2023-06-14 LAB — TSH: TSH: 0.59 u[IU]/mL (ref 0.35–5.50)

## 2023-06-14 NOTE — Progress Notes (Signed)
Subjective:  Patient ID: Carmen [name], female    DOB: 17-Oct-1940  Age: 82 y.o. MRN: [id number]  Patient Care Team: Mort Sawyers, FNP as PCP - General (Family Medicine) End, Cristal Deer, MD as PCP - Cardiology (Cardiology) Lanier Prude, MD as PCP - Electrophysiology (Cardiology) End, Cristal Deer, MD as Consulting Physician (Cardiology)   CC:  Chief Complaint  Patient presents with   Annual Exam    HPI Carmen [name] is a 82 y.o. female who presents today for acute concerns accompanied by daughter.  Pt is with acute concerns.   Patient frustrated as she is at home all of the time, isolated to her own accord due to generalized weakness and stroke residual. She has depression at this time, no longer interested in the same hobbies she used to enjoy and not calling to talk to friends. She is barely getting out of the house due to difficulty with ambulation, had had multiple falls and is unsteady on her feet.   Right lower leg pain, at times loses her breath due to the pain which also contributes to her lack of movement.   She is slowly trying to take more water, daughter does state that she is not walking around as much and daugther worries that she is losing too much muscle tone.  she is a 'fight' to get her to eat. She       DEPRESSION SCREENING    06/14/2023   11:33 AM 04/11/2023    8:04 AM 04/02/2023   10:33 AM 02/21/2023   11:05 AM 01/15/2023    8:12 AM 01/09/2023   11:41 AM 12/08/2022    8:44 AM  PHQ 2/9 Scores  PHQ - 2 Score 6 0 0 0 0 0 1  PHQ- 9 Score 26           ROS: Negative unless specifically indicated above in HPI.    Current Outpatient Medications:    atorvastatin (LIPITOR) 40 MG tablet, Take 1 tablet (40 mg total) by mouth at bedtime., Disp: 90 tablet, Rfl: 3   CALCIUM PO, Take 1 tablet by mouth daily., Disp: , Rfl:    Cholecalciferol (VITAMIN D-3) 25 MCG (1000 UT) CAPS, Take 1,000 Units by mouth daily., Disp: , Rfl:    clopidogrel (PLAVIX) 75 MG tablet,  Take 1 tablet (75 mg total) by mouth daily. START ON 5/13, Disp: 90 tablet, Rfl: 3   cyanocobalamin (VITAMIN B12) 500 MCG tablet, Take 500 mcg by mouth daily., Disp: , Rfl:    diphenhydrAMINE (BENADRYL) 25 MG tablet, Take 25 mg by mouth at bedtime as needed for allergies., Disp: , Rfl:    divalproex (DEPAKOTE) 500 MG DR tablet, Take 1 tablet (500 mg total) by mouth at bedtime., Disp: 90 tablet, Rfl: 3   famotidine (PEPCID AC) 10 MG tablet, Take 10 mg by mouth daily as needed for heartburn or indigestion., Disp: , Rfl:    furosemide (LASIX) 20 MG tablet, Take 1 tablet (20 mg total) by mouth daily as needed., Disp: 30 tablet, Rfl: 5   HYDROcodone-acetaminophen (NORCO/VICODIN) 5-325 MG tablet, Take 1 tablet by mouth 2 (two) times daily as needed for severe pain. Must last 30 days., Disp: 60 tablet, Rfl: 0   [START ON 06/20/2023] HYDROcodone-acetaminophen (NORCO/VICODIN) 5-325 MG tablet, Take 1 tablet by mouth 2 (two) times daily as needed for severe pain. Must last 30 days., Disp: 60 tablet, Rfl: 0   hydroxychloroquine (PLAQUENIL) 200 MG tablet, Take 200 mg by mouth., Disp: , Rfl:  levothyroxine (SYNTHROID) 137 MCG tablet, Take 1 tablet (137 mcg total) by mouth daily before breakfast., Disp: 90 tablet, Rfl: 3   loratadine (CLARITIN) 10 MG tablet, Take 1 tablet (10 mg total) by mouth daily., Disp: 30 tablet, Rfl: 11   losartan (COZAAR) 25 MG tablet, Take 1/2 tablet once daily, Disp: 45 tablet, Rfl: 3   magnesium oxide (MAG-OX) 400 (240 Mg) MG tablet, Take 400 mg by mouth daily., Disp: , Rfl:    metoprolol succinate (TOPROL-XL) 100 MG 24 hr tablet, Take 1/2 (one-half) tablet by mouth once daily, Disp: 45 tablet, Rfl: 0   naloxone (NARCAN) nasal spray 4 mg/0.1 mL, Place 1 spray into the nose as needed for up to 365 doses (for opioid-induced respiratory depresssion). In case of emergency (overdose), spray once into each nostril. If no response within 3 minutes, repeat application and call 911., Disp: 1 each,  Rfl: 0   traZODone (DESYREL) 100 MG tablet, TAKE 1 TABLET BY MOUTH AT BEDTIME AS NEEDED FOR SLEEP, Disp: 90 tablet, Rfl: 3    Objective:    BP 124/84 (BP Location: Right Arm, Patient Position: Sitting, Cuff Size: Normal)   Pulse 81   Temp 98 F (36.7 C) (Temporal)   Ht 5' (1.524 m)   Wt 166 lb 9.6 oz (75.6 kg)   SpO2 95%   BMI 32.54 kg/m   BP Readings from Last 3 Encounters:  06/14/23 124/84  04/11/23 120/87  04/06/23 100/76      Physical Exam Constitutional:      General: She is not in acute distress.    Appearance: Normal appearance. She is normal weight. She is not ill-appearing, toxic-appearing or diaphoretic.  HENT:     Head: Normocephalic.  Cardiovascular:     Rate and Rhythm: Normal rate.  Pulmonary:     Effort: Pulmonary effort is normal.  Musculoskeletal:        General: Normal range of motion.  Neurological:     General: No focal deficit present.     Mental Status: She is alert and oriented to person, place, and time. Mental status is at baseline.  Psychiatric:        Mood and Affect: Mood is depressed. Affect is tearful.        Behavior: Behavior normal.        Thought Content: Thought content normal.        Judgment: Judgment normal.          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  Recommended therapy as well, referral placed.  Encouraged pt to get out more with daughter and reach out to old friends to start. Encouraged social support system outside of her home by going to church   Orders: -     AMB Referral to Pharmacy Medication Management -     Ambulatory referral to Psychology  Essential hypertension  Acquired  hypothyroidism Assessment & Plan: Repeat tsh today to see if labs in better range. Continue current dose levothyroxine  Orders: -     TSH  Other fatigue -     CBC with Differential/Platelet -     Vitamin B12  Therapeutic drug  monitoring -     Valproic acid level  Hyperlipidemia LDL goal <70 -     Lipid panel  Encounter for immunization -     Flu Vaccine Trivalent High Dose (Fluad)  Unsteady gait when walking Assessment & Plan: Limited her ambulation of home and decreasing ability for IADLs resulting in depression Referral placed for [facility] physical therapy  Encouraged pt to ambulate with walker with assistance of daugther     History of syncope      Follow-up: Return in about 1 month (around 07/14/2023) for f/u depression.   Mort Sawyers, FNP

## 2023-06-15 ENCOUNTER — Telehealth: Payer: Self-pay

## 2023-06-15 DIAGNOSIS — Z87898 Personal history of other specified conditions: Secondary | ICD-10-CM | POA: Insufficient documentation

## 2023-06-15 LAB — VALPROIC ACID LEVEL: Valproic Acid Lvl: 35.8 mg/L — ABNORMAL LOW (ref 50.0–100.0)

## 2023-06-15 NOTE — Assessment & Plan Note (Signed)
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  Recommended therapy as well, referral placed.  Encouraged pt to get out more with daughter and reach out to old friends to start. Encouraged social support system outside of her home by going to church

## 2023-06-15 NOTE — Assessment & Plan Note (Signed)
Limited her ambulation of home and decreasing ability for IADLs resulting in depression Referral placed for [facility] physical therapy  Encouraged pt to ambulate with walker with assistance of daugther

## 2023-06-15 NOTE — Progress Notes (Signed)
Care Guide Note  06/15/2023 Name: [name] MRN: [id number] DOB: 1940/11/17  Referred by: Mort Sawyers, FNP Reason for referral : Care Coordination (Outreach to schedule with Pharm d )   Carmen [name] is a 82 y.o. year old female who is a primary care patient of Mort Sawyers, FNP. [name] was referred to the pharmacist for assistance related to Depression.    Successful contact was made with the patient to discuss pharmacy services including being ready for the pharmacist to call at least 5 minutes before the scheduled appointment time, to have medication bottles and any blood sugar or blood pressure readings ready for review. The patient agreed to meet with the pharmacist via with the pharmacist via telephone visit on (date/time).  06/20/2023  Penne Lash, RMA Care Guide [facility]  [place], [place] [address] Direct Dial: [phone] [email or web address]

## 2023-06-15 NOTE — Assessment & Plan Note (Signed)
Repeat tsh today to see if labs in better range. Continue current dose levothyroxine

## 2023-06-18 ENCOUNTER — Other Ambulatory Visit: Payer: Self-pay | Admitting: Family

## 2023-06-18 ENCOUNTER — Ambulatory Visit: Payer: Medicare Other | Admitting: Psychology

## 2023-06-18 DIAGNOSIS — E039 Hypothyroidism, unspecified: Secondary | ICD-10-CM

## 2023-06-18 DIAGNOSIS — D649 Anemia, unspecified: Secondary | ICD-10-CM

## 2023-06-20 ENCOUNTER — Telehealth: Payer: Self-pay | Admitting: Pharmacist

## 2023-06-20 ENCOUNTER — Other Ambulatory Visit: Payer: Medicare Other

## 2023-06-20 DIAGNOSIS — K080 Exfoliation of teeth due to systemic causes: Secondary | ICD-10-CM | POA: Diagnosis not present

## 2023-06-20 NOTE — Progress Notes (Deleted)
06/20/2023 Name: Carmen [name] MRN: [id number] DOB: 1941/02/16  No chief complaint on file.   Carmen [name] is a 82 y.o. year old female who presented for a telephone visit.   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    Subjective:  Care Team: Primary Care Provider: Mort Sawyers, FNP ; Next Scheduled Visit: *** {careteamprovider:27366}  Medication Access/Adherence  Current Pharmacy:  [facility] [address], [place] - [address] [place] [address] Phone: [phone] Fax: [phone]  [id number] - [address] [place] - [address] [place] [address] Phone: [phone] Fax: [phone]  [facility] [address] [place] [address] Phone: [phone] Fax: [phone]   Patient reports affordability concerns with their medications: {YES/NO:21197} Patient reports access/transportation concerns to their pharmacy: {YES/NO:21197} Patient reports adherence concerns with their medications:  {YES/NO:21197} ***   {Pharmacy S/O Choices:26420}   Objective: DEPRESSION SCREENING (06/14/23 office visit)     06/14/2023   11:33 AM 04/11/2023    8:04 AM 04/02/2023   10:33 AM 02/21/2023   11:05 AM 01/15/2023    8:12 AM 01/09/2023   11:41 AM 12/08/2022    8:44 AM  PHQ 2/9 Scores  PHQ - 2 Score 6 0 0 0 0 0 1  PHQ- 9 Score 26                Orders placed or performed in visit on 04/06/23   EKG 12-Lead    Lab Results  Component Value Date   CREATININE 0.85 02/02/2023   BUN 14 02/02/2023   NA 139 02/02/2023   K 4.5 02/02/2023   CL 101 02/02/2023   CO2 24 02/02/2023    Lab Results  Component Value Date   CHOL 93 06/14/2023   HDL 46.50 06/14/2023   LDLCALC 22 06/14/2023   LDLDIRECT 99.0 07/26/2021   TRIG 125.0 06/14/2023   CHOLHDL 2 06/14/2023    Medications Reviewed Today   Medications  were not reviewed in this encounter       Assessment/Plan:   Depression/Anxiety: Currently uncontrolled per PHQ9 score of 26 indicating *** depression. Medication selection with regard to concern for QT-prolongation. Baseline  - Reviewed behavioral health support strategies including *** - Recommend to ***  - Avoid citalopram given BBW for potential Qtc prolongation - Avoid TCA      Follow Up Plan: ***  ***

## 2023-06-20 NOTE — Telephone Encounter (Signed)
Attempted to contact patient for scheduled appointment for medication management. Left HIPAA compliant message for patient to return my call at their convenience.   Loree Fee, PharmD Clinical Pharmacist [facility] [phone]

## 2023-06-22 DIAGNOSIS — I10 Essential (primary) hypertension: Secondary | ICD-10-CM | POA: Diagnosis not present

## 2023-06-22 DIAGNOSIS — Z556 Problems related to health literacy: Secondary | ICD-10-CM | POA: Diagnosis not present

## 2023-06-22 DIAGNOSIS — H539 Unspecified visual disturbance: Secondary | ICD-10-CM | POA: Diagnosis not present

## 2023-06-22 DIAGNOSIS — H9191 Unspecified hearing loss, right ear: Secondary | ICD-10-CM | POA: Diagnosis not present

## 2023-06-22 DIAGNOSIS — E039 Hypothyroidism, unspecified: Secondary | ICD-10-CM | POA: Diagnosis not present

## 2023-06-22 DIAGNOSIS — E785 Hyperlipidemia, unspecified: Secondary | ICD-10-CM | POA: Diagnosis not present

## 2023-06-22 DIAGNOSIS — F32A Depression, unspecified: Secondary | ICD-10-CM | POA: Diagnosis not present

## 2023-06-22 DIAGNOSIS — Z7902 Long term (current) use of antithrombotics/antiplatelets: Secondary | ICD-10-CM | POA: Diagnosis not present

## 2023-06-22 DIAGNOSIS — M79604 Pain in right leg: Secondary | ICD-10-CM | POA: Diagnosis not present

## 2023-06-22 DIAGNOSIS — I69398 Other sequelae of cerebral infarction: Secondary | ICD-10-CM | POA: Diagnosis not present

## 2023-06-22 DIAGNOSIS — W19XXXD Unspecified fall, subsequent encounter: Secondary | ICD-10-CM | POA: Diagnosis not present

## 2023-06-22 DIAGNOSIS — R531 Weakness: Secondary | ICD-10-CM | POA: Diagnosis not present

## 2023-06-22 DIAGNOSIS — D649 Anemia, unspecified: Secondary | ICD-10-CM | POA: Diagnosis not present

## 2023-06-22 DIAGNOSIS — Z5181 Encounter for therapeutic drug level monitoring: Secondary | ICD-10-CM | POA: Diagnosis not present

## 2023-06-27 ENCOUNTER — Ambulatory Visit: Payer: Medicare Other | Attending: Internal Medicine | Admitting: Cardiology

## 2023-06-27 ENCOUNTER — Other Ambulatory Visit: Payer: Self-pay | Admitting: Cardiology

## 2023-06-27 VITALS — BP 126/84 | HR 80 | Ht 60.0 in | Wt 166.8 lb

## 2023-06-27 DIAGNOSIS — I4821 Permanent atrial fibrillation: Secondary | ICD-10-CM

## 2023-06-27 DIAGNOSIS — Z95818 Presence of other cardiac implants and grafts: Secondary | ICD-10-CM | POA: Diagnosis not present

## 2023-06-27 DIAGNOSIS — I10 Essential (primary) hypertension: Secondary | ICD-10-CM | POA: Diagnosis not present

## 2023-06-27 DIAGNOSIS — I25810 Atherosclerosis of coronary artery bypass graft(s) without angina pectoris: Secondary | ICD-10-CM

## 2023-06-27 DIAGNOSIS — E785 Hyperlipidemia, unspecified: Secondary | ICD-10-CM

## 2023-06-27 MED ORDER — ASPIRIN 81 MG PO TBEC
81.0000 mg | DELAYED_RELEASE_TABLET | Freq: Every day | ORAL | Status: DC
Start: 2023-06-27 — End: 2023-06-28

## 2023-06-27 NOTE — Patient Instructions (Addendum)
Medication Instructions:   STOP TAKING PLAVIX (LAST DOSE WILL BE ON 06/30/23)  START TAKING ASPIRIN 81 MG BY MOUTH DAILY (FIRST DOSE WILL BE ON 07/01/23)  *If you need a refill on your cardiac medications before your next appointment, please call your pharmacy*     Follow-Up:  AS PLANNED WITH RYAN DUNN PA-C AT DR. Serita Kyle OFFICE ON 09/27/23 AT 8 AM

## 2023-06-27 NOTE — Progress Notes (Signed)
[facility]                                     Cardiology Office Note:    Date:  06/27/2023   ID:  Carmen [name], DOB [birth year], MRN [id number]  PCP:  Mort Sawyers, FNP  [facility] Cardiologist:  Yvonne Kendall, MD  [facility] Electrophysiologist:  Lanier Prude, MD   Referring MD: Mort Sawyers, FNP   Chief Complaint  Patient presents with   6 month s/p LAAO    History of Present Illness:    Carmen [name] is a 82 y.o. female with a hx of CAD s/p CABG in 2005 with LIMA to LAD, sequential SVG to diagonal and OM, and SVG to PDA with subsequent PCI due to failure of multiple grafts (Taxus stents to proximal LAD, mid LCx, and RCA), CVA, PAD, HTN, HLD, hypothyroidism, anemia, frequent falls, and persistent atrial fibrillation s/p LAAO with Watchman and is being seen today for 6 month follow up.   [name] has been followed by Dr. Okey Dupre for her cardiology care after moving to [place] to be closer to family. She underwent echo on 12/14/2020 that showed an EF of 55-60%, no RWMA, moderate LVH, normal RVSF and ventricular size, normal PASP, mildly dilated left atrium, and mild mitral regurgitation. Lower extremity ABIs were normal bilaterally.     She was admitted to the hospital in 05/2022 with altered mental status/unresponsiveness episode concerning for TIA. She also reported a fall where she struck her forehead and had a hematoma. MRI of the brain was without acute abnormalities. She was discharged on Depakote at the recommendation of neurology. She declined cardiac monitor recommended by neurology.  She presented to the ED a month later after sustaining a fall while using her bedside commode with associated periorbital hematoma. In follow up with Dr. Okey Dupre in12/2023, she continued to have ongoing falls. She was then referred to Dr. Lalla Brothers for LAAO closure to avoid long-term anticoagulation given her frequent falls. She was felt to be an acceptable candidate and is now s/p LAAO  closure with Watchman FLX 31mm device. She was restarted on Eliquis 5 mg BID x 45 days (5/12) following implant with plans to transition to Plavix and continue for 6 months of therapy. Post implant CT with no leak or device thrombus.     She is here today with her daughter and continues to do well with no chest pain, LE edema, SOB, palpitations, bleeding in stool or urine, dizziness, or syncope.   Past Medical History:  Diagnosis Date   Abnormal CT scan, lumbar spine (05/11/2021) 05/17/2021   (05/11/2021) LUMBAR CT FINDINGS: Alignment: Lumbar levocurvature, apex L4. Mild lateral listhesis L4 on L5 of approximately 3 mm. Vertebrae: Remote appearing superior endplate deformities at T12 with 10% height loss and L1 with up to 20% height loss. Multilevel discogenic and facet degenerative changes. Mild bilateral SI joint arthrosis.  DISC LEVELS: T11-T12: Near complete disc height loss with de   Abnormal MRI, cervical spine (05/12/2021) 05/17/2021   (05/12/2021) CERVICAL MRI FINDINGS: Motion artifact is present. Posterior Fossa, vertebral arteries, paraspinal tissues: Left superior cerebellar infarct.   DISC LEVELS: C2-C3: Disc bulge with endplate osteophytes. Uncovertebral and facet hypertrophy. C3-C4: Disc bulge with endplate osteophytes. Uncovertebral and facet hypertrophy. Mild canal stenosis. Marked foraminal stenosis. C4-C5: Disc bulge w   Allergy    Anemia    Anxiety  ml/m LA Biplane Vol: 96.3 ml 54.34 ml/m AORTIC VALVE                    PULMONIC VALVE AV Area (Vmax):    2.40 cm     PV Vmax:       0.79 m/s AV Area (Vmean):   2.23 cm     PV Peak grad:  2.5 mmHg AV Area (VTI):     2.33 cm AV Vmax:           73.43 cm/s AV Vmean:          50.167 cm/s AV VTI:            0.128 m AV Peak Grad:      2.2 mmHg AV Mean Grad:      1.0 mmHg LVOT Vmax:         69.27 cm/s LVOT Vmean:        44.033 cm/s LVOT VTI:          0.117 m LVOT/AV VTI ratio: 0.91  AORTA Ao Root diam: 3.20 cm Ao Arch diam: 2.8 cm  TRICUSPID VALVE TR Peak grad:   41.7 mmHg TR Vmax:        323.00 cm/s  SHUNTS Systemic VTI:  0.12 m Systemic Diam: 1.80 cm  Debbe Odea MD Electronically signed by Debbe Odea MD Signature Date/Time: 12/20/2022/3:52:43 PM    Final   TEE  ECHO TEE 12/28/2022  Narrative TRANSESOPHOGEAL ECHO REPORT    Patient Name:   Carmen [name] Date of Exam: 12/28/2022 Medical Rec #:  [id number]    Height:       60.0 in Accession #:    [id number]   Weight:       177.0 lb Date of Birth:  28-Jun-1941    BSA:           1.772 m Patient Age:    81 years     BP:           150/90 mmHg Patient Gender: F            HR:           82 bpm. Exam Location:  Inpatient  Procedure: Transesophageal Echo, 3D Echo, Color Doppler and Cardiac Doppler  Indications:     I48.2 Chronic atrial fibrillation  History:         Patient has prior history of Echocardiogram examinations, most recent 12/20/2022. Arrythmias:Atrial Fibrillation; Risk Factors:Hypertension.  Sonographer:     Irving Burton Senior RDCS Referring Phys:  [id number] Lanier Prude Diagnosing Phys: Thurmon Fair MD   Sonographer Comments: Watchman Procedure   PROCEDURE: After discussion of the risks and benefits of a TEE, an informed consent was obtained from the patient. The transesophogeal probe was passed without difficulty through the esophogus of the patient. Sedation performed by different physician. The patient was monitored while under deep sedation. The patient developed no complications during the procedure.   TEE, including 3D live and postprocessed imaging was used tuide the procedure (baseline assessment, transseptal puncture, device deployment, final result and complications). IMPRESSIONS   1. At the end of the procedure there is a well seated 31 mm Watchman FLX device that seals the left atrial appendage well and shows adequate compression, without peridevice leak or thrombus. 2. 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 3. Left ventricular ejection fraction, by estimation, is 55 to 60%. The left ventricle has normal  ml/m LA Biplane Vol: 96.3 ml 54.34 ml/m AORTIC VALVE                    PULMONIC VALVE AV Area (Vmax):    2.40 cm     PV Vmax:       0.79 m/s AV Area (Vmean):   2.23 cm     PV Peak grad:  2.5 mmHg AV Area (VTI):     2.33 cm AV Vmax:           73.43 cm/s AV Vmean:          50.167 cm/s AV VTI:            0.128 m AV Peak Grad:      2.2 mmHg AV Mean Grad:      1.0 mmHg LVOT Vmax:         69.27 cm/s LVOT Vmean:        44.033 cm/s LVOT VTI:          0.117 m LVOT/AV VTI ratio: 0.91  AORTA Ao Root diam: 3.20 cm Ao Arch diam: 2.8 cm  TRICUSPID VALVE TR Peak grad:   41.7 mmHg TR Vmax:        323.00 cm/s  SHUNTS Systemic VTI:  0.12 m Systemic Diam: 1.80 cm  Debbe Odea MD Electronically signed by Debbe Odea MD Signature Date/Time: 12/20/2022/3:52:43 PM    Final   TEE  ECHO TEE 12/28/2022  Narrative TRANSESOPHOGEAL ECHO REPORT    Patient Name:   Carmen [name] Date of Exam: 12/28/2022 Medical Rec #:  [id number]    Height:       60.0 in Accession #:    [id number]   Weight:       177.0 lb Date of Birth:  28-Jun-1941    BSA:           1.772 m Patient Age:    81 years     BP:           150/90 mmHg Patient Gender: F            HR:           82 bpm. Exam Location:  Inpatient  Procedure: Transesophageal Echo, 3D Echo, Color Doppler and Cardiac Doppler  Indications:     I48.2 Chronic atrial fibrillation  History:         Patient has prior history of Echocardiogram examinations, most recent 12/20/2022. Arrythmias:Atrial Fibrillation; Risk Factors:Hypertension.  Sonographer:     Irving Burton Senior RDCS Referring Phys:  [id number] Lanier Prude Diagnosing Phys: Thurmon Fair MD   Sonographer Comments: Watchman Procedure   PROCEDURE: After discussion of the risks and benefits of a TEE, an informed consent was obtained from the patient. The transesophogeal probe was passed without difficulty through the esophogus of the patient. Sedation performed by different physician. The patient was monitored while under deep sedation. The patient developed no complications during the procedure.   TEE, including 3D live and postprocessed imaging was used tuide the procedure (baseline assessment, transseptal puncture, device deployment, final result and complications). IMPRESSIONS   1. At the end of the procedure there is a well seated 31 mm Watchman FLX device that seals the left atrial appendage well and shows adequate compression, without peridevice leak or thrombus. 2. 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 3. Left ventricular ejection fraction, by estimation, is 55 to 60%. The left ventricle has normal  [facility]                                     Cardiology Office Note:    Date:  06/27/2023   ID:  Carmen [name], DOB [birth year], MRN [id number]  PCP:  Mort Sawyers, FNP  [facility] Cardiologist:  Yvonne Kendall, MD  [facility] Electrophysiologist:  Lanier Prude, MD   Referring MD: Mort Sawyers, FNP   Chief Complaint  Patient presents with   6 month s/p LAAO    History of Present Illness:    Carmen [name] is a 82 y.o. female with a hx of CAD s/p CABG in 2005 with LIMA to LAD, sequential SVG to diagonal and OM, and SVG to PDA with subsequent PCI due to failure of multiple grafts (Taxus stents to proximal LAD, mid LCx, and RCA), CVA, PAD, HTN, HLD, hypothyroidism, anemia, frequent falls, and persistent atrial fibrillation s/p LAAO with Watchman and is being seen today for 6 month follow up.   [name] has been followed by Dr. Okey Dupre for her cardiology care after moving to [place] to be closer to family. She underwent echo on 12/14/2020 that showed an EF of 55-60%, no RWMA, moderate LVH, normal RVSF and ventricular size, normal PASP, mildly dilated left atrium, and mild mitral regurgitation. Lower extremity ABIs were normal bilaterally.     She was admitted to the hospital in 05/2022 with altered mental status/unresponsiveness episode concerning for TIA. She also reported a fall where she struck her forehead and had a hematoma. MRI of the brain was without acute abnormalities. She was discharged on Depakote at the recommendation of neurology. She declined cardiac monitor recommended by neurology.  She presented to the ED a month later after sustaining a fall while using her bedside commode with associated periorbital hematoma. In follow up with Dr. Okey Dupre in12/2023, she continued to have ongoing falls. She was then referred to Dr. Lalla Brothers for LAAO closure to avoid long-term anticoagulation given her frequent falls. She was felt to be an acceptable candidate and is now s/p LAAO  closure with Watchman FLX 31mm device. She was restarted on Eliquis 5 mg BID x 45 days (5/12) following implant with plans to transition to Plavix and continue for 6 months of therapy. Post implant CT with no leak or device thrombus.     She is here today with her daughter and continues to do well with no chest pain, LE edema, SOB, palpitations, bleeding in stool or urine, dizziness, or syncope.   Past Medical History:  Diagnosis Date   Abnormal CT scan, lumbar spine (05/11/2021) 05/17/2021   (05/11/2021) LUMBAR CT FINDINGS: Alignment: Lumbar levocurvature, apex L4. Mild lateral listhesis L4 on L5 of approximately 3 mm. Vertebrae: Remote appearing superior endplate deformities at T12 with 10% height loss and L1 with up to 20% height loss. Multilevel discogenic and facet degenerative changes. Mild bilateral SI joint arthrosis.  DISC LEVELS: T11-T12: Near complete disc height loss with de   Abnormal MRI, cervical spine (05/12/2021) 05/17/2021   (05/12/2021) CERVICAL MRI FINDINGS: Motion artifact is present. Posterior Fossa, vertebral arteries, paraspinal tissues: Left superior cerebellar infarct.   DISC LEVELS: C2-C3: Disc bulge with endplate osteophytes. Uncovertebral and facet hypertrophy. C3-C4: Disc bulge with endplate osteophytes. Uncovertebral and facet hypertrophy. Mild canal stenosis. Marked foraminal stenosis. C4-C5: Disc bulge w   Allergy    Anemia    Anxiety  [facility]                                     Cardiology Office Note:    Date:  06/27/2023   ID:  Carmen [name], DOB [birth year], MRN [id number]  PCP:  Mort Sawyers, FNP  [facility] Cardiologist:  Yvonne Kendall, MD  [facility] Electrophysiologist:  Lanier Prude, MD   Referring MD: Mort Sawyers, FNP   Chief Complaint  Patient presents with   6 month s/p LAAO    History of Present Illness:    Carmen [name] is a 82 y.o. female with a hx of CAD s/p CABG in 2005 with LIMA to LAD, sequential SVG to diagonal and OM, and SVG to PDA with subsequent PCI due to failure of multiple grafts (Taxus stents to proximal LAD, mid LCx, and RCA), CVA, PAD, HTN, HLD, hypothyroidism, anemia, frequent falls, and persistent atrial fibrillation s/p LAAO with Watchman and is being seen today for 6 month follow up.   [name] has been followed by Dr. Okey Dupre for her cardiology care after moving to [place] to be closer to family. She underwent echo on 12/14/2020 that showed an EF of 55-60%, no RWMA, moderate LVH, normal RVSF and ventricular size, normal PASP, mildly dilated left atrium, and mild mitral regurgitation. Lower extremity ABIs were normal bilaterally.     She was admitted to the hospital in 05/2022 with altered mental status/unresponsiveness episode concerning for TIA. She also reported a fall where she struck her forehead and had a hematoma. MRI of the brain was without acute abnormalities. She was discharged on Depakote at the recommendation of neurology. She declined cardiac monitor recommended by neurology.  She presented to the ED a month later after sustaining a fall while using her bedside commode with associated periorbital hematoma. In follow up with Dr. Okey Dupre in12/2023, she continued to have ongoing falls. She was then referred to Dr. Lalla Brothers for LAAO closure to avoid long-term anticoagulation given her frequent falls. She was felt to be an acceptable candidate and is now s/p LAAO  closure with Watchman FLX 31mm device. She was restarted on Eliquis 5 mg BID x 45 days (5/12) following implant with plans to transition to Plavix and continue for 6 months of therapy. Post implant CT with no leak or device thrombus.     She is here today with her daughter and continues to do well with no chest pain, LE edema, SOB, palpitations, bleeding in stool or urine, dizziness, or syncope.   Past Medical History:  Diagnosis Date   Abnormal CT scan, lumbar spine (05/11/2021) 05/17/2021   (05/11/2021) LUMBAR CT FINDINGS: Alignment: Lumbar levocurvature, apex L4. Mild lateral listhesis L4 on L5 of approximately 3 mm. Vertebrae: Remote appearing superior endplate deformities at T12 with 10% height loss and L1 with up to 20% height loss. Multilevel discogenic and facet degenerative changes. Mild bilateral SI joint arthrosis.  DISC LEVELS: T11-T12: Near complete disc height loss with de   Abnormal MRI, cervical spine (05/12/2021) 05/17/2021   (05/12/2021) CERVICAL MRI FINDINGS: Motion artifact is present. Posterior Fossa, vertebral arteries, paraspinal tissues: Left superior cerebellar infarct.   DISC LEVELS: C2-C3: Disc bulge with endplate osteophytes. Uncovertebral and facet hypertrophy. C3-C4: Disc bulge with endplate osteophytes. Uncovertebral and facet hypertrophy. Mild canal stenosis. Marked foraminal stenosis. C4-C5: Disc bulge w   Allergy    Anemia    Anxiety  Occupational History   Not on file  Tobacco Use   Smoking status: Former    Current packs/day: 0.00    Types: Cigarettes    Start date: 05/1990    Quit date: 05/2020    Years since quitting: 3.1   Smokeless tobacco: Never   Tobacco comments:    Smoked about 1 pack per month  Vaping Use   Vaping status: Never Used  Substance and Sexual Activity   Alcohol use: Not Currently   Drug use: Yes    Types: Hydrocodone   Sexual activity: Not Currently  Other Topics Concern   Not on file  Social History Narrative   ** Merged History Encounter **       Social Determinants of Health   Financial Resource Strain: Low Risk  (08/14/2022)   Overall Financial Resource Strain (CARDIA)    Difficulty of Paying Living Expenses: Not hard at all  Food Insecurity: No Food Insecurity (12/28/2022)   Hunger Vital Sign    Worried About Running Out of Food in the Last Year: Never true    Ran Out of Food in the Last Year: Never true  Transportation Needs: No Transportation Needs (12/28/2022)   PRAPARE - Administrator, Civil Service (Medical): No    Lack of Transportation (Non-Medical): No  Physical Activity: Insufficiently Active (08/14/2022)   Exercise Vital Sign    Days of Exercise per Week: 7 days     Minutes of Exercise per Session: 20 min  Stress: No Stress Concern Present (08/14/2022)   [employer] of Occupational Health - Occupational Stress Questionnaire    Feeling of Stress : Not at all  Social Connections: Not on file     Family History: The patient's family history includes Bipolar disorder in her daughter; Breast cancer in her mother; Heart attack in her father. There is no history of Diabetes Mellitus II.  ROS:   Please see the history of present illness.    All other systems reviewed and are negative.  EKGs/Labs/Other Studies Reviewed:    The following studies were reviewed today:   Cardiac Studies & Procedures       ECHOCARDIOGRAM  ECHOCARDIOGRAM COMPLETE 12/20/2022  Narrative ECHOCARDIOGRAM REPORT    Patient Name:   Carmen [name] Date of Exam: 12/20/2022 Medical Rec #:  [id number]    Height:       60.0 in Accession #:    [id number]   Weight:       177.0 lb Date of Birth:  Jan 12, 1941    BSA:          1.772 m Patient Age:    81 years     BP:           122/70 mmHg Patient Gender: F            HR:           87-120 bpm. Exam Location:  [facility]  Procedure: 2D Echo, Cardiac Doppler and Color Doppler  Indications:    I48.91* Unspeicified atrial fibrillation  History:        Patient has prior history of Echocardiogram examinations, most recent 05/09/2022. CAD, Prior CABG, Stroke, PAD, Carotid Disease and Interstitial lung disease, Arrythmias:Atrial Fibrillation, Signs/Symptoms:Edema, Syncope and Shortness of Breath; Risk Factors:Hypertension, Dyslipidemia and Former Smoker.  Sonographer:    Quentin Ore RDMS, RVT, RDCS Referring Phys: [id number] [facility]   Sonographer Comments: Technically difficult study due to poor echo windows. IMPRESSIONS   1. Left ventricular ejection fraction,  ml/m LA Biplane Vol: 96.3 ml 54.34 ml/m AORTIC VALVE                    PULMONIC VALVE AV Area (Vmax):    2.40 cm     PV Vmax:       0.79 m/s AV Area (Vmean):   2.23 cm     PV Peak grad:  2.5 mmHg AV Area (VTI):     2.33 cm AV Vmax:           73.43 cm/s AV Vmean:          50.167 cm/s AV VTI:            0.128 m AV Peak Grad:      2.2 mmHg AV Mean Grad:      1.0 mmHg LVOT Vmax:         69.27 cm/s LVOT Vmean:        44.033 cm/s LVOT VTI:          0.117 m LVOT/AV VTI ratio: 0.91  AORTA Ao Root diam: 3.20 cm Ao Arch diam: 2.8 cm  TRICUSPID VALVE TR Peak grad:   41.7 mmHg TR Vmax:        323.00 cm/s  SHUNTS Systemic VTI:  0.12 m Systemic Diam: 1.80 cm  Debbe Odea MD Electronically signed by Debbe Odea MD Signature Date/Time: 12/20/2022/3:52:43 PM    Final   TEE  ECHO TEE 12/28/2022  Narrative TRANSESOPHOGEAL ECHO REPORT    Patient Name:   Carmen [name] Date of Exam: 12/28/2022 Medical Rec #:  [id number]    Height:       60.0 in Accession #:    [id number]   Weight:       177.0 lb Date of Birth:  28-Jun-1941    BSA:           1.772 m Patient Age:    81 years     BP:           150/90 mmHg Patient Gender: F            HR:           82 bpm. Exam Location:  Inpatient  Procedure: Transesophageal Echo, 3D Echo, Color Doppler and Cardiac Doppler  Indications:     I48.2 Chronic atrial fibrillation  History:         Patient has prior history of Echocardiogram examinations, most recent 12/20/2022. Arrythmias:Atrial Fibrillation; Risk Factors:Hypertension.  Sonographer:     Irving Burton Senior RDCS Referring Phys:  [id number] Lanier Prude Diagnosing Phys: Thurmon Fair MD   Sonographer Comments: Watchman Procedure   PROCEDURE: After discussion of the risks and benefits of a TEE, an informed consent was obtained from the patient. The transesophogeal probe was passed without difficulty through the esophogus of the patient. Sedation performed by different physician. The patient was monitored while under deep sedation. The patient developed no complications during the procedure.   TEE, including 3D live and postprocessed imaging was used tuide the procedure (baseline assessment, transseptal puncture, device deployment, final result and complications). IMPRESSIONS   1. At the end of the procedure there is a well seated 31 mm Watchman FLX device that seals the left atrial appendage well and shows adequate compression, without peridevice leak or thrombus. 2. 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 3. Left ventricular ejection fraction, by estimation, is 55 to 60%. The left ventricle has normal

## 2023-06-28 ENCOUNTER — Other Ambulatory Visit: Payer: Self-pay | Admitting: Cardiology

## 2023-06-28 ENCOUNTER — Telehealth: Payer: Self-pay | Admitting: Cardiology

## 2023-06-28 DIAGNOSIS — D649 Anemia, unspecified: Secondary | ICD-10-CM | POA: Diagnosis not present

## 2023-06-28 DIAGNOSIS — E039 Hypothyroidism, unspecified: Secondary | ICD-10-CM | POA: Diagnosis not present

## 2023-06-28 DIAGNOSIS — I69398 Other sequelae of cerebral infarction: Secondary | ICD-10-CM | POA: Diagnosis not present

## 2023-06-28 DIAGNOSIS — M79604 Pain in right leg: Secondary | ICD-10-CM | POA: Diagnosis not present

## 2023-06-28 DIAGNOSIS — Z5181 Encounter for therapeutic drug level monitoring: Secondary | ICD-10-CM | POA: Diagnosis not present

## 2023-06-28 DIAGNOSIS — H539 Unspecified visual disturbance: Secondary | ICD-10-CM | POA: Diagnosis not present

## 2023-06-28 DIAGNOSIS — Z7902 Long term (current) use of antithrombotics/antiplatelets: Secondary | ICD-10-CM | POA: Diagnosis not present

## 2023-06-28 DIAGNOSIS — E785 Hyperlipidemia, unspecified: Secondary | ICD-10-CM | POA: Diagnosis not present

## 2023-06-28 DIAGNOSIS — W19XXXD Unspecified fall, subsequent encounter: Secondary | ICD-10-CM | POA: Diagnosis not present

## 2023-06-28 DIAGNOSIS — H9191 Unspecified hearing loss, right ear: Secondary | ICD-10-CM | POA: Diagnosis not present

## 2023-06-28 DIAGNOSIS — Z556 Problems related to health literacy: Secondary | ICD-10-CM | POA: Diagnosis not present

## 2023-06-28 DIAGNOSIS — R531 Weakness: Secondary | ICD-10-CM | POA: Diagnosis not present

## 2023-06-28 DIAGNOSIS — I10 Essential (primary) hypertension: Secondary | ICD-10-CM | POA: Diagnosis not present

## 2023-06-28 DIAGNOSIS — F32A Depression, unspecified: Secondary | ICD-10-CM | POA: Diagnosis not present

## 2023-06-28 MED ORDER — CLOPIDOGREL BISULFATE 75 MG PO TABS
75.0000 mg | ORAL_TABLET | Freq: Every day | ORAL | Status: DC
Start: 2023-06-28 — End: 2024-04-16

## 2023-06-28 NOTE — Telephone Encounter (Signed)
Ms. Moehring is now 6 months post implant with initial plan to stop Plavix on 9/28 and start lifelong ASA 81mg  daily given hx of CAD. After reviewing Dr. Serita Kyle note form 04/06/23, will clarify regimen with him as it appears that Plavix monotherapy may be preferred for her.   Message received to clarify plan. She will stop ASA and restart Plavix 75mg  daily. Medication list has been updated and patient has been notified.   Georgie Chard NP-C Structural Heart Team  Pager: [phone] Phone: [phone]

## 2023-06-29 ENCOUNTER — Ambulatory Visit: Payer: Medicare Other

## 2023-06-29 ENCOUNTER — Encounter: Payer: Self-pay | Admitting: Pharmacist

## 2023-06-29 DIAGNOSIS — H9191 Unspecified hearing loss, right ear: Secondary | ICD-10-CM | POA: Diagnosis not present

## 2023-06-29 DIAGNOSIS — F32A Depression, unspecified: Secondary | ICD-10-CM | POA: Diagnosis not present

## 2023-06-29 DIAGNOSIS — I69398 Other sequelae of cerebral infarction: Secondary | ICD-10-CM | POA: Diagnosis not present

## 2023-06-29 DIAGNOSIS — E785 Hyperlipidemia, unspecified: Secondary | ICD-10-CM | POA: Diagnosis not present

## 2023-06-29 DIAGNOSIS — H539 Unspecified visual disturbance: Secondary | ICD-10-CM | POA: Diagnosis not present

## 2023-06-29 DIAGNOSIS — Z556 Problems related to health literacy: Secondary | ICD-10-CM | POA: Diagnosis not present

## 2023-06-29 DIAGNOSIS — Z5181 Encounter for therapeutic drug level monitoring: Secondary | ICD-10-CM | POA: Diagnosis not present

## 2023-06-29 DIAGNOSIS — E039 Hypothyroidism, unspecified: Secondary | ICD-10-CM | POA: Diagnosis not present

## 2023-06-29 DIAGNOSIS — R531 Weakness: Secondary | ICD-10-CM | POA: Diagnosis not present

## 2023-06-29 DIAGNOSIS — I10 Essential (primary) hypertension: Secondary | ICD-10-CM | POA: Diagnosis not present

## 2023-06-29 DIAGNOSIS — Z7902 Long term (current) use of antithrombotics/antiplatelets: Secondary | ICD-10-CM | POA: Diagnosis not present

## 2023-06-29 DIAGNOSIS — W19XXXD Unspecified fall, subsequent encounter: Secondary | ICD-10-CM | POA: Diagnosis not present

## 2023-06-29 DIAGNOSIS — M79604 Pain in right leg: Secondary | ICD-10-CM | POA: Diagnosis not present

## 2023-06-29 DIAGNOSIS — D649 Anemia, unspecified: Secondary | ICD-10-CM | POA: Diagnosis not present

## 2023-07-02 ENCOUNTER — Ambulatory Visit (INDEPENDENT_AMBULATORY_CARE_PROVIDER_SITE_OTHER): Payer: Medicare Other | Admitting: Psychology

## 2023-07-02 DIAGNOSIS — F3289 Other specified depressive episodes: Secondary | ICD-10-CM | POA: Diagnosis not present
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
## 2023-07-04 DIAGNOSIS — Z5181 Encounter for therapeutic drug level monitoring: Secondary | ICD-10-CM | POA: Diagnosis not present

## 2023-07-04 DIAGNOSIS — D649 Anemia, unspecified: Secondary | ICD-10-CM | POA: Diagnosis not present

## 2023-07-04 DIAGNOSIS — I69398 Other sequelae of cerebral infarction: Secondary | ICD-10-CM | POA: Diagnosis not present

## 2023-07-04 DIAGNOSIS — H9191 Unspecified hearing loss, right ear: Secondary | ICD-10-CM | POA: Diagnosis not present

## 2023-07-04 DIAGNOSIS — I10 Essential (primary) hypertension: Secondary | ICD-10-CM | POA: Diagnosis not present

## 2023-07-04 DIAGNOSIS — H539 Unspecified visual disturbance: Secondary | ICD-10-CM | POA: Diagnosis not present

## 2023-07-04 DIAGNOSIS — E039 Hypothyroidism, unspecified: Secondary | ICD-10-CM | POA: Diagnosis not present

## 2023-07-04 DIAGNOSIS — Z556 Problems related to health literacy: Secondary | ICD-10-CM | POA: Diagnosis not present

## 2023-07-04 DIAGNOSIS — R531 Weakness: Secondary | ICD-10-CM | POA: Diagnosis not present

## 2023-07-04 DIAGNOSIS — E785 Hyperlipidemia, unspecified: Secondary | ICD-10-CM | POA: Diagnosis not present

## 2023-07-04 DIAGNOSIS — F32A Depression, unspecified: Secondary | ICD-10-CM | POA: Diagnosis not present

## 2023-07-04 DIAGNOSIS — M79604 Pain in right leg: Secondary | ICD-10-CM | POA: Diagnosis not present

## 2023-07-04 DIAGNOSIS — Z7902 Long term (current) use of antithrombotics/antiplatelets: Secondary | ICD-10-CM | POA: Diagnosis not present

## 2023-07-04 DIAGNOSIS — W19XXXD Unspecified fall, subsequent encounter: Secondary | ICD-10-CM | POA: Diagnosis not present

## 2023-07-05 DIAGNOSIS — W19XXXD Unspecified fall, subsequent encounter: Secondary | ICD-10-CM | POA: Diagnosis not present

## 2023-07-05 DIAGNOSIS — E785 Hyperlipidemia, unspecified: Secondary | ICD-10-CM | POA: Diagnosis not present

## 2023-07-05 DIAGNOSIS — D649 Anemia, unspecified: Secondary | ICD-10-CM | POA: Diagnosis not present

## 2023-07-05 DIAGNOSIS — H539 Unspecified visual disturbance: Secondary | ICD-10-CM | POA: Diagnosis not present

## 2023-07-05 DIAGNOSIS — H9191 Unspecified hearing loss, right ear: Secondary | ICD-10-CM | POA: Diagnosis not present

## 2023-07-05 DIAGNOSIS — Z5181 Encounter for therapeutic drug level monitoring: Secondary | ICD-10-CM | POA: Diagnosis not present

## 2023-07-05 DIAGNOSIS — Z556 Problems related to health literacy: Secondary | ICD-10-CM | POA: Diagnosis not present

## 2023-07-05 DIAGNOSIS — R531 Weakness: Secondary | ICD-10-CM | POA: Diagnosis not present

## 2023-07-05 DIAGNOSIS — M79604 Pain in right leg: Secondary | ICD-10-CM | POA: Diagnosis not present

## 2023-07-05 DIAGNOSIS — F32A Depression, unspecified: Secondary | ICD-10-CM | POA: Diagnosis not present

## 2023-07-05 DIAGNOSIS — I10 Essential (primary) hypertension: Secondary | ICD-10-CM | POA: Diagnosis not present

## 2023-07-05 DIAGNOSIS — I69398 Other sequelae of cerebral infarction: Secondary | ICD-10-CM | POA: Diagnosis not present

## 2023-07-05 DIAGNOSIS — Z7902 Long term (current) use of antithrombotics/antiplatelets: Secondary | ICD-10-CM | POA: Diagnosis not present

## 2023-07-05 DIAGNOSIS — E039 Hypothyroidism, unspecified: Secondary | ICD-10-CM | POA: Diagnosis not present

## 2023-07-09 NOTE — Progress Notes (Unsigned)
PROVIDER NOTE: Information contained herein reflects review and annotations entered in association with encounter. Interpretation of such information and data should be left to medically-trained personnel. Information provided to patient can be located elsewhere in the medical record under "Patient Instructions". Document created using STT-dictation technology, any transcriptional errors that may result from process are unintentional.    Patient: Carmen [name]  Service Category: E/M  Provider: Oswaldo Done, MD  DOB: 10-19-1940  DOS: 07/11/2023  Referring Provider: Mort Sawyers, FNP  MRN: [id number]  Specialty: Interventional Pain Management  PCP: Mort Sawyers, FNP  Type: Established Patient  Setting: Ambulatory outpatient    Location: Office  Delivery: Face-to-face     HPI  [name], a 82 y.o. year old female, is here today because of her No primary diagnosis found.. Carmen [name]'s primary complain today is No chief complaint on file.  Pertinent problems: Carmen [name] has Peripheral vascular disease (HCC); Chronic low back pain (1ry area of Pain) (Bilateral) (R>L) w/o sciatica; Dorsalgia, unspecified; Chronic pain syndrome; Chronic neck pain (2ry area of Pain) (Bilateral) (L>R); Chronic lower extremity pain (3ry area of Pain) (Bilateral) (R>L); Chronic hand pain (Left); Chronic hand pain (Right); Lumbar compression fracture (Old) (Multilevel), sequela; Chronic cervical radiculopathy (Bilateral); DDD (degenerative disc disease), cervical; Foraminal stenosis of cervical region; Lumbar facet syndrome (Bilateral); Lumbosacral facet hypertrophy (Multilevel) (Bilateral); Lumbar facet arthropathy (Multilevel) (Bilateral); 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HCC); Cervical facet syndrome (Bilateral); Cervical facet hypertrophy (Multilevel) (Bilateral); Spondylosis without myelopathy or radiculopathy, cervical region; Spondylosis without myelopathy or radiculopathy, lumbosacral region; Other  intervertebral disc degeneration, lumbar region; DDD (degenerative disc disease), lumbosacral; Levoscoliosis of lumbar spine; Lumbosacral foraminal stenosis (Multilevel) (Bilateral); Degenerative lateral spondylolisthesis of L4/L5 (3mm); Baastrup's syndrome; Unsteady gait when walking; Chronic hip pain (Right); Greater trochanteric bursitis (Right); Chronic lower extremity pain (Right); Osteoarthritis of hip (Right); Right sided weakness; Pedal edema; and Calcium pyrophosphate arthropathy on their pertinent problem list. Pain Assessment: Severity of   is reported as a  /10. Location:    / . Onset:  . Quality:  . Timing:  . Modifying factor(s):  Marland Kitchen Vitals:  vitals were not taken for this visit.  BMI: Estimated body mass index is 32.58 kg/m as calculated from the following:   Height as of 06/27/23: 5' (1.524 m).   Weight as of 06/27/23: 166 lb 12.8 oz (75.7 kg). Last encounter: 04/11/2023. Last procedure: 08/22/2022.  Reason for encounter: medication management. ***  RTCB: 10/18/2023   Pharmacotherapy Assessment  Analgesic: Hydrocodone/APAP 5/325, 1 tab p.o. BID (10 mg/day of hydrocodone) (10 MME) (last filled on 05/02/2021) MME/day: 10 mg/day   Monitoring: [place] PMP: PDMP reviewed during this encounter.       Pharmacotherapy: No side-effects or adverse reactions reported. Compliance: No problems identified. Effectiveness: Clinically acceptable.  No notes on file  No results found for: "CBDTHCR" No results found for: "D8THCCBX" No results found for: "D9THCCBX"  UDS:  Summary  Date Value Ref Range Status  04/11/2023 Note  Final    Comment:    ==================================================================== [employer] [facility] (MW) ==================================================================== Test                             Result       Flag       Units  Drug Present and Declared for Prescription Verification   Hydrocodone                    109  EXPECTED   ng/mg creat    Dihydrocodeine                 74           EXPECTED   ng/mg creat   Norhydrocodone                 432          EXPECTED   ng/mg creat    Sources of hydrocodone include scheduled prescription medications.    Dihydrocodeine and norhydrocodone are expected metabolites of    hydrocodone. Dihydrocodeine is also available as a scheduled    prescription medication.  ==================================================================== Test                      Result    Flag   Units      Ref Range   Creatinine              78               mg/dL      >=29 ==================================================================== Declared Medications:  The flagging and interpretation on this report are based on the  following declared medications.  Unexpected results may arise from  inaccuracies in the declared medications.   **Note: The testing scope of this panel includes these medications:   Hydrocodone (Norco)   **Note: The testing scope of this panel does not include the  following reported medications:   Acetaminophen (Norco)  Atorvastatin (Lipitor)  Calcium  Clopidogrel (Plavix)  Diphenhydramine (Benadryl)  Divaleproex (Depakote)  Famotidine (Pepcid)  Furosemide (Lasix)  Hydroxychloroquine (Plaquenil)  Levothyroxine (Synthroid)  Loratadine (Claritin)  Losartan (Cozaar)  Magnesium (Mag-Ox)  Metoprolol (Toprol)  Naloxone (Narcan)  Trazodone (Desyrel)  Vitamin B12  Vitamin D3 ==================================================================== For clinical consultation, please call [phone]. ====================================================================       ROS  Constitutional: Denies any fever or chills Gastrointestinal: No reported hemesis, hematochezia, vomiting, or acute GI distress Musculoskeletal: Denies any acute onset joint swelling, redness, loss of ROM, or weakness Neurological: No reported episodes of acute onset apraxia, aphasia, dysarthria,  agnosia, amnesia, paralysis, loss of coordination, or loss of consciousness  Medication Review  Calcium, HYDROcodone-acetaminophen, Vitamin D-3, atorvastatin, clopidogrel, cyanocobalamin, diphenhydrAMINE, divalproex, famotidine, furosemide, hydroxychloroquine, levothyroxine, loratadine, losartan, magnesium oxide, metoprolol succinate, naloxone, and traZODone  History Review  Allergy: Ms. Thorp has No Known Allergies. Drug: Ms. Vanamburg  reports current drug use. Drug: Hydrocodone. Alcohol:  reports that she does not currently use alcohol. Tobacco:  reports that she quit smoking about 3 years ago. Her smoking use included cigarettes. She started smoking about 33 years ago. She has never used smokeless tobacco. Social: Ms. Gaines  reports that she quit smoking about 3 years ago. Her smoking use included cigarettes. She started smoking about 33 years ago. She has never used smokeless tobacco. She reports that she does not currently use alcohol. She reports current drug use. Drug: Hydrocodone. Medical:  has a past medical history of Abnormal CT scan, lumbar spine (05/11/2021) (05/17/2021), Abnormal MRI, cervical spine (05/12/2021) (05/17/2021), Allergy, Anemia, Anxiety, Arthritis, Back pain, Coronary artery disease, Depression, History of stroke (10/02/2020), Hypertension, Peripheral vascular disease (HCC), Personal history of nicotine dependence (10/02/2020), Prsnl hx of TIA (TIA), and cereb infrc w/o resid deficits (10/02/2020), Stroke [facility]), and Thyroid disease. Surgical: Ms. Wierenga  has a past surgical history that includes Cardiac catheterization; Coronary angioplasty; Coronary artery bypass graft; back injections; Cataract extraction; LEFT ATRIAL APPENDAGE OCCLUSION (N/A, 12/28/2022); and TEE  without cardioversion (N/A, 12/28/2022). Family: family history includes Bipolar disorder in her daughter; Breast cancer in her mother; Heart attack in her father.  Laboratory Chemistry Profile   Renal Lab Results   Component Value Date   BUN 14 02/02/2023   CREATININE 0.85 02/02/2023   BCR 16 02/02/2023   GFR 67.85 10/18/2021   GFRAA 73 11/22/2020   GFRNONAA >60 10/14/2022    Hepatic Lab Results  Component Value Date   AST 30 10/14/2022   ALT 26 10/14/2022   ALBUMIN 3.8 10/14/2022   ALKPHOS 39 10/14/2022   AMMONIA 19 10/06/2020    Electrolytes Lab Results  Component Value Date   NA 139 02/02/2023   K 4.5 02/02/2023   CL 101 02/02/2023   CALCIUM 9.1 02/02/2023   MG 1.6 06/22/2022    Bone Lab Results  Component Value Date   25OHVITD1 12 (L) 03/14/2021   25OHVITD2 <1.0 03/14/2021   25OHVITD3 12 03/14/2021    Inflammation (CRP: Acute Phase) (ESR: Chronic Phase) Lab Results  Component Value Date   CRP 0.8 03/14/2021   ESRSEDRATE 3 08/01/2022         Note: Above Lab results reviewed.  Recent Imaging Review  CT CARDIAC MORPH/PULM VEIN W/CM&W/O CA SCORE Addendum: ADDENDUM REPORT: 03/03/2023 02:03   EXAM:  OVER-READ INTERPRETATION  CT CHEST   The following report is an over-read performed by radiologist Dr.  Rosette Reveal [facility] on 03/03/2023. This  over-read does not include interpretation of cardiac or coronary  anatomy or pathology. The coronary/cardiac CTA interpretation by the  cardiologist is attached.   COMPARISON:  CT 01/06/2023   FINDINGS:  Vascular: Aortic atherosclerosis. The included aorta is normal in  caliber, tortuous.   Mediastinum/nodes: Calcified right hilar lymph nodes consistent with  prior granulomatous disease. Unremarkable esophagus.   Lungs: No focal airspace disease. Benign calcified granuloma in the  right lower lobe no pleural fluid. The included airways are patent.   Upper abdomen: No acute or unexpected findings.   Musculoskeletal: There are no acute or suspicious osseous  abnormalities. Prior median sternotomy.   IMPRESSION:  1.  Aortic Atherosclerosis (ICD10-I70.0).  2. Sequela of prior granulomatous disease  with calcified right lower  lobe nodule and lymph nodes, benign needing no further imaging  follow-up.   Electronically Signed    By: Narda Rutherford M.D.    On: 03/03/2023 02:03 Narrative: CLINICAL DATA:  Post left atrial appendage closure with 31 mm Watchman FLX device.  EXAM: Cardiac CT/CTA  TECHNIQUE: A non-contrast, gated CT scan was obtained with axial slices of 3 mm through the heart for calcium scoring. Calcium scoring was performed using the Agatston method. A 120 kV prospective, gated, contrast cardiac scan was obtained. Gantry rotation speed was 250 msecs and collimation was 0.6 mm. Nitroglycerin was not given. A delayed scan was obtained to exclude left atrial appendage thrombus. The 3D dataset was reconstructed in 5% intervals of the 25-50% of the R-R cycle. Late systolic phases were analyzed on a dedicated workstation using MPR, MIP, and VRT modes. The patient received 80 cc of contrast.  FINDINGS: Image quality: excellent.  Noise artifact is: Limited.  Left Atrium: The left atrial size is normal severely dilated. There is a small PFO. There is normal pulmonary vein drainage into the left atrium (2 on the right and 2 on the left).  Left Atrial Appendage: A 31 mm Watchman FLX is present in the appendage. There is a small ~2.5 mm peri-device leak present  with resultant incomplete closure of the LAA. No device related thrombus.  Coronary Arteries: Calcium scoring not performed due to prior CABG. Normal coronary origin. Right dominance. The study was performed without use of NTG and is insufficient for plaque evaluation. No bypass grafts are visualized. The LIMA is atretic. Stents noted.  Right Atrium: Right atrial size is severely dilated.  Right Ventricle: The right ventricular cavity is within normal limits.  Left Ventricle: The ventricular cavity size is within normal limits.  Pulmonary Artery: Dilated pulmonary artery suggestive of  pulmonary hypertension.  Cardiac valves: The aortic valve is trileaflet without significant calcification. The mitral valve is normal structure without significant calcification.  Aorta: Normal caliber with no significant disease.  Pericardium: Normal thickness with no significant effusion or calcium present.  Extra-cardiac findings: See attached radiology report for non-cardiac structures.  IMPRESSION: 1. A 31 mm Watchman FLX is present in the appendage with a small ~2.5 mm peri-device leak.  2. Severe biatrial enlargement.  3. 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  4. Small PFO.  Gerri Spore T. Flora Lipps, MD  Electronically Signed: By: Lennie Odor M.D. On: 02/26/2023 21:58 Note: Reviewed        Physical Exam  General appearance: Well nourished, well developed, and well hydrated. In no apparent acute distress Mental status: Alert, oriented x 3 (person, place, & time)       Respiratory: No evidence of acute respiratory distress Eyes: PERLA Vitals: There were no vitals taken for this visit. BMI: Estimated body mass index is 32.58 kg/m as calculated from the following:   Height as of 06/27/23: 5' (1.524 m).   Weight as of 06/27/23: 166 lb 12.8 oz (75.7 kg). Ideal: Ideal body weight: 45.5 kg (100 lb 4.9 oz) Adjusted ideal body weight: 57.6 kg (126 lb 14.5 oz)  Assessment   Diagnosis Status  1. Chronic pain syndrome   2. Pharmacologic therapy   3. Chronic neck pain (2ry area of Pain) (Bilateral) (L>R)   4. Encounter for medication management   5. Encounter for chronic pain management   6. Chronic use of opiate for therapeutic purpose   7. Lumbar facet joint syndrome (Bilateral)   8. Chronic lower extremity pain (3ry area of Pain) (Bilateral) (L>R)   9. Chronic low back pain (1ry area of Pain) (Bilateral) (L>R) w/o sciatica    Controlled Controlled Controlled   Updated Problems: No problems updated.  Plan of Care  Problem-specific:  No  problem-specific Assessment & Plan notes found for this encounter.  Ms. Aliyna Wellbrock has a current medication list which includes the following long-term medication(s): atorvastatin, calcium, diphenhydramine, divalproex, famotidine, furosemide, hydrocodone-acetaminophen, levothyroxine, loratadine, losartan, metoprolol succinate, and trazodone.  Pharmacotherapy (Medications Ordered): No orders of the defined types were placed in this encounter.  Orders:  No orders of the defined types were placed in this encounter.  Follow-up plan:   No follow-ups on file.      Interventional Therapies  Risk Factors  Considerations:   PLAVIX + ASA Anticoagulation (Stop: 7-10 days  Restart: 2 hours)   Planned  Pending:      Under consideration:   Diagnostic/therapeutic cervical ESI  Therapeutic left lumbar facet RFA #1  Diagnostic/therapeutic right L4-5 LESI #1  Diagnostic/therapeutic right L4 and L5 TFESI #1    Completed:   Diagnostic/Therapeutic right IA Hip & TBI inj. x2 (08/22/2022) (100/100/100/90-95)  Therapeutic right lumbar facet RFA x1 (06/13/2022) (100/100/100/90-100)  Diagnostic right lumbar facet MBB x3 (01/26/2022) (100/100/90/90)  Diagnostic left lumbar facet  MBB x3 (01/26/2022) (100/100/90/90)  Diagnostic left cervical facet MBB x1 (06/28/2021) (25/25/0/0)    Therapeutic  Palliative (PRN) options:   Therapeutic lumbar facet MBB        Recent Visits Date Type Provider Dept  04/11/23 Office Visit Delano Metz, MD [facility]  Showing recent visits within past 90 days and meeting all other requirements Future Appointments Date Type Provider Dept  07/11/23 Appointment Delano Metz, MD [facility]  Showing future appointments within next 90 days and meeting all other requirements  I discussed the assessment and treatment plan with the patient. The patient was provided an opportunity to ask questions and all were answered. The patient agreed  with the plan and demonstrated an understanding of the instructions.  Patient advised to call back or seek an in-person evaluation if the symptoms or condition worsens.  Duration of encounter: *** minutes.  Total time on encounter, as per AMA guidelines included both the face-to-face and non-face-to-face time personally spent by the physician and/or other qualified health care professional(s) on the day of the encounter (includes time in activities that require the physician or other qualified health care professional and does not include time in activities normally performed by clinical staff). Physician's time may include the following activities when performed: Preparing to see the patient (e.g., pre-charting review of records, searching for previously ordered imaging, lab work, and nerve conduction tests) Review of prior analgesic pharmacotherapies. Reviewing PMP Interpreting ordered tests (e.g., lab work, imaging, nerve conduction tests) Performing post-procedure evaluations, including interpretation of diagnostic procedures Obtaining and/or reviewing separately obtained history Performing a medically appropriate examination and/or evaluation Counseling and educating the patient/family/caregiver Ordering medications, tests, or procedures Referring and communicating with other health care professionals (when not separately reported) Documenting clinical information in the electronic or other health record Independently interpreting results (not separately reported) and communicating results to the patient/ family/caregiver Care coordination (not separately reported)  Note by: Oswaldo Done, MD Date: 07/11/2023; Time: 1:56 PM

## 2023-07-10 DIAGNOSIS — Z556 Problems related to health literacy: Secondary | ICD-10-CM | POA: Diagnosis not present

## 2023-07-10 DIAGNOSIS — E039 Hypothyroidism, unspecified: Secondary | ICD-10-CM | POA: Diagnosis not present

## 2023-07-10 DIAGNOSIS — M79604 Pain in right leg: Secondary | ICD-10-CM | POA: Diagnosis not present

## 2023-07-10 DIAGNOSIS — I10 Essential (primary) hypertension: Secondary | ICD-10-CM | POA: Diagnosis not present

## 2023-07-10 DIAGNOSIS — E785 Hyperlipidemia, unspecified: Secondary | ICD-10-CM | POA: Diagnosis not present

## 2023-07-10 DIAGNOSIS — W19XXXD Unspecified fall, subsequent encounter: Secondary | ICD-10-CM | POA: Diagnosis not present

## 2023-07-10 DIAGNOSIS — I69398 Other sequelae of cerebral infarction: Secondary | ICD-10-CM | POA: Diagnosis not present

## 2023-07-10 DIAGNOSIS — D649 Anemia, unspecified: Secondary | ICD-10-CM | POA: Diagnosis not present

## 2023-07-10 DIAGNOSIS — R531 Weakness: Secondary | ICD-10-CM | POA: Diagnosis not present

## 2023-07-10 DIAGNOSIS — Z7902 Long term (current) use of antithrombotics/antiplatelets: Secondary | ICD-10-CM | POA: Diagnosis not present

## 2023-07-10 DIAGNOSIS — H9191 Unspecified hearing loss, right ear: Secondary | ICD-10-CM | POA: Diagnosis not present

## 2023-07-10 DIAGNOSIS — H539 Unspecified visual disturbance: Secondary | ICD-10-CM | POA: Diagnosis not present

## 2023-07-10 DIAGNOSIS — Z5181 Encounter for therapeutic drug level monitoring: Secondary | ICD-10-CM | POA: Diagnosis not present

## 2023-07-10 DIAGNOSIS — F32A Depression, unspecified: Secondary | ICD-10-CM | POA: Diagnosis not present

## 2023-07-10 NOTE — Patient Instructions (Incomplete)
____________________________________________________________________________________________  Opioid Pain Medication Update  To: All patients taking opioid pain medications. (I.e.: hydrocodone, hydromorphone, oxycodone, oxymorphone, morphine, codeine, methadone, tapentadol, tramadol, buprenorphine, fentanyl, etc.)  Re: Updated review of side effects and adverse reactions of opioid analgesics, as well as new information about long term effects of this class of medications.  Direct risks of long-term opioid therapy are not limited to opioid addiction and overdose. Potential medical risks include serious fractures, breathing problems during sleep, hyperalgesia, immunosuppression, chronic constipation, bowel obstruction, myocardial infarction, and tooth decay secondary to xerostomia.  Unpredictable adverse effects that can occur even if you take your medication correctly: Cognitive impairment, respiratory depression, and death. Most people think that if they take their medication "correctly", and "as instructed", that they will be safe. Nothing could be farther from the truth. In reality, a significant amount of recorded deaths associated with the use of opioids has occurred in individuals that had taken the medication for a long time, and were taking their medication correctly. The following are examples of how this can happen: Patient taking his/her medication for a long time, as instructed, without any side effects, is given a certain antibiotic or another unrelated medication, which in turn triggers a "Drug-to-drug interaction" leading to disorientation, cognitive impairment, impaired reflexes, respiratory depression or an untoward event leading to serious bodily harm or injury, including death.  Patient taking his/her medication for a long time, as instructed, without any side effects, develops an acute impairment of liver and/or kidney function. This will lead to a rapid inability of the body to  breakdown and eliminate their pain medication, which will result in effects similar to an "overdose", but with the same medicine and dose that they had always taken. This again may lead to disorientation, cognitive impairment, impaired reflexes, respiratory depression or an untoward event leading to serious bodily harm or injury, including death.  A similar problem will occur with patients as they grow older and their liver and kidney function begins to decrease as part of the aging process.  Background information: Historically, the original case for using long-term opioid therapy to treat chronic noncancer pain was based on safety assumptions that subsequent experience has called into question. In 1996, the American Pain Society and the American Academy of Pain Medicine issued a consensus statement supporting long-term opioid therapy. This statement acknowledged the dangers of opioid prescribing but concluded that the risk for addiction was low; respiratory depression induced by opioids was short-lived, occurred mainly in opioid-naive patients, and was antagonized by pain; tolerance was not a common problem; and efforts to control diversion should not constrain opioid prescribing. This has now proven to be wrong. Experience regarding the risks for opioid addiction, misuse, and overdose in community practice has failed to support these assumptions.  According to the Centers for Disease Control and Prevention, fatal overdoses involving opioid analgesics have increased sharply over the past decade. Currently, more than 96,700 people die from drug overdoses every year. Opioids are a factor in 7 out of every 10 overdose deaths. Deaths from drug overdose have surpassed motor vehicle accidents as the leading cause of death for individuals between the ages of 80 and 61.  Clinical data suggest that neuroendocrine dysfunction may be very common in both men and women, potentially causing hypogonadism, erectile  dysfunction, infertility, decreased libido, osteoporosis, and depression. Recent studies linked higher opioid dose to increased opioid-related mortality. Controlled observational studies reported that long-term opioid therapy may be associated with increased risk for cardiovascular events. Subsequent meta-analysis concluded  that the safety of long-term opioid therapy in elderly patients has not been proven.   Side Effects and adverse reactions: Common side effects: Drowsiness (sedation). Dizziness. Nausea and vomiting. Constipation. Physical dependence -- Dependence often manifests with withdrawal symptoms when opioids are discontinued or decreased. Tolerance -- As you take repeated doses of opioids, you require increased medication to experience the same effect of pain relief. Respiratory depression -- This can occur in healthy people, especially with higher doses. However, people with COPD, asthma or other lung conditions may be even more susceptible to fatal respiratory impairment.  Uncommon side effects: An increased sensitivity to feeling pain and extreme response to pain (hyperalgesia). Chronic use of opioids can lead to this. Delayed gastric emptying (the process by which the contents of your stomach are moved into your small intestine). Muscle rigidity. Immune system and hormonal dysfunction. Quick, involuntary muscle jerks (myoclonus). Arrhythmia. Itchy skin (pruritus). Dry mouth (xerostomia).  Long-term side effects: Chronic constipation. Sleep-disordered breathing (SDB). Increased risk of bone fractures. Hypothalamic-pituitary-adrenal dysregulation. Increased risk of overdose.  RISKS: Respiratory depression and death: Opioids increase the risk of respiratory depression and death.  Drug-to-drug interactions: Opioids are relatively contraindicated in combination with benzodiazepines, sleep inducers, and other central nervous system depressants. Other classes of medications  (i.e.: certain antibiotics and even over-the-counter medications) may also trigger or induce respiratory depression in some patients.  Medical conditions: Patients with pre-existing respiratory problems are at higher risk of respiratory failure and/or depression when in combination with opioid analgesics. Opioids are relatively contraindicated in some medical conditions such as central sleep apnea.   Fractures and Falls:  Opioids increase the risk and incidence of falls. This is of particular importance in elderly patients.  Endocrine System:  Long-term administration is associated with endocrine abnormalities (endocrinopathies). (Also known as Opioid-induced Endocrinopathy) Influences on both the hypothalamic-pituitary-adrenal axis?and the hypothalamic-pituitary-gonadal axis have been demonstrated with consequent hypogonadism and adrenal insufficiency in both sexes. Hypogonadism and decreased levels of dehydroepiandrosterone sulfate have been reported in men and women. Endocrine effects include: Amenorrhoea in women (abnormal absence of menstruation) Reduced libido in both sexes Decreased sexual function Erectile dysfunction in men Hypogonadisms (decreased testicular function with shrinkage of testicles) Infertility Depression and fatigue Loss of muscle mass Anxiety Depression Immune suppression Hyperalgesia Weight gain Anemia Osteoporosis Patients (particularly women of childbearing age) should avoid opioids. There is insufficient evidence to recommend routine monitoring of asymptomatic patients taking opioids in the long-term for hormonal deficiencies.  Immune System: Human studies have demonstrated that opioids have an immunomodulating effect. These effects are mediated via opioid receptors both on immune effector cells and in the central nervous system. Opioids have been demonstrated to have adverse effects on antimicrobial response and anti-tumour surveillance. Buprenorphine has  been demonstrated to have no impact on immune function.  Opioid Induced Hyperalgesia: Human studies have demonstrated that prolonged use of opioids can lead to a state of abnormal pain sensitivity, sometimes called opioid induced hyperalgesia (OIH). Opioid induced hyperalgesia is not usually seen in the absence of tolerance to opioid analgesia. Clinically, hyperalgesia may be diagnosed if the patient on long-term opioid therapy presents with increased pain. This might be qualitatively and anatomically distinct from pain related to disease progression or to breakthrough pain resulting from development of opioid tolerance. Pain associated with hyperalgesia tends to be more diffuse than the pre-existing pain and less defined in quality. Management of opioid induced hyperalgesia requires opioid dose reduction.  Cancer: Chronic opioid therapy has been associated with an increased risk of cancer  among noncancer patients with chronic pain. This association was more evident in chronic strong opioid users. Chronic opioid consumption causes significant pathological changes in the small intestine and colon. Epidemiological studies have found that there is a link between opium dependence and initiation of gastrointestinal cancers. Cancer is the second leading cause of death after cardiovascular disease. Chronic use of opioids can cause multiple conditions such as GERD, immunosuppression and renal damage as well as carcinogenic effects, which are associated with the incidence of cancers.   Mortality: Long-term opioid use has been associated with increased mortality among patients with chronic non-cancer pain (CNCP).  Prescription of long-acting opioids for chronic noncancer pain was associated with a significantly increased risk of all-cause mortality, including deaths from causes other than overdose.  Reference: Von Korff M, Kolodny A, Deyo RA, Chou R. Long-term opioid therapy reconsidered. Ann Intern Med. 2011  Sep 6;155(5):325-8. doi: 10.7326/[id number]. PMID: 64403474; PMCID: [id number]. Randon Goldsmith, Hayward RA, Dunn KM, [place] KP. Risk of adverse events in patients prescribed long-term opioids: A cohort study in the [place] [facility] Research Datalink. Eur J Pain. 2019 May;23(5):908-922. doi: 10.1002/ejp.1357. Epub 2019 Jan 31. PMID: 43329518. Colameco S, Coren JS, Ciervo CA. Continuous opioid treatment for chronic noncancer pain: a time for moderation in prescribing. Postgrad Med. 2009 Jul;121(4):61-6. doi: 10.3810/pgm.2009.07.2032. PMID: 84166063. William Hamburger RN, [place] SD, Blazina I, Cristopher Peru, Bougatsos C, Deyo RA. The effectiveness and risks of long-term opioid therapy for chronic pain: a systematic review for a [employer] of [employer] Pathways to [employer]. Ann Intern Med. 2015 Feb 17;162(4):276-86. doi: 10.7326/M14-2559. PMID: 01601093. Caryl Bis [facility], Makuc DM. NCHS Data Brief No. 22. [place]: Centers for Disease Control and Prevention; 2009. Sep, Increase in Fatal Poisonings Involving Opioid Analgesics in the [place], 1999-2006. Song IA, Choi HR, Oh TK. Long-term opioid use and mortality in patients with chronic non-cancer pain: Ten-year follow-up study in [place] from 2010 through 2019. EClinicalMedicine. 2022 Jul 18;51:101558. doi: 10.1016/j.eclinm.2022.235573. PMID: 22025427; PMCID: [id number]. Huser, W., Schubert, T., Vogelmann, T. et al. All-cause mortality in patients with long-term opioid therapy compared with non-opioid analgesics for chronic non-cancer pain: a database study. BMC Med 18, 162 (2020). http://lester.info/ Rashidian H, Karie Kirks, Malekzadeh R, Haghdoost AA. An Ecological Study of the Association between Opiate Use and Incidence of Cancers. Addict Health. 2016 Fall;8(4):252-260. PMID: 51761607; PMCID: [id number].  Our Goal: Our goal is to control your  pain with means other than the use of opioid pain medications.  Our Recommendation: Talk to your physician about coming off of these medications. We can assist you with the tapering down and stopping these medicines. Based on the new information, even if you cannot completely stop the medication, a decrease in the dose may be associated with a lesser risk. Ask for other means of controlling the pain. Decrease or eliminate those factors that significantly contribute to your pain such as smoking, obesity, and a diet heavily tilted towards "inflammatory" nutrients.  Last Updated: 04/09/2023   ____________________________________________________________________________________________     ____________________________________________________________________________________________  National Pain Medication Shortage  The [place] is experiencing worsening drug shortages. These have had a negative widespread effect on patient care and treatment. Not expected to improve any time soon. Predicted to last past 2029.   Drug shortage list (generic names) Oxycodone IR Oxycodone/APAP Oxymorphone IR Hydromorphone Hydrocodone/APAP Morphine  Where is the problem?  Manufacturing and supply level.  Will this shortage affect you?  Only if you  take any of the above pain medications.  How? You may be unable to fill your prescription.  Your pharmacist may offer a "partial fill" of your prescription. (Warning: Do not accept partial fills.) Prescriptions partially filled cannot be transferred to another pharmacy. Read our Medication Rules and Regulation. Depending on how much medicine you are dependent on, you may experience withdrawals when unable to get the medication.  Recommendations: Consider ending your dependence on opioid pain medications. Ask your pain specialist to assist you with the process. Consider switching to a medication currently not in shortage, such as Buprenorphine. Talk to your pain  specialist about this option. Consider decreasing your pain medication requirements by managing tolerance thru "Drug Holidays". This may help minimize withdrawals, should you run out of medicine. Control your pain thru the use of non-pharmacological interventional therapies.   Your prescriber: Prescribers cannot be blamed for shortages. Medication manufacturing and supply issues cannot be fixed by the prescriber.   NOTE: The prescriber is not responsible for supplying the medication, or solving supply issues. Work with your pharmacist to solve it. The patient is responsible for the decision to take or continue taking the medication and for identifying and securing a legal supply source. By law, supplying the medication is the job and responsibility of the pharmacy. The prescriber is responsible for the evaluation, monitoring, and prescribing of these medications.   Prescribers will NOT: Re-issue prescriptions that have been partially filled. Re-issue prescriptions already sent to a pharmacy.  Re-send prescriptions to a different pharmacy because yours did not have your medication. Ask pharmacist to order more medicine or transfer the prescription to another pharmacy. (Read below.)  New 2023 regulation: "June 02, 2022 Revised Regulation Allows DEA-Registered Pharmacies to Transfer Electronic Prescriptions at a Patient's Request DEA Headquarters Division - Public Information Office Patients now have the ability to request their electronic prescription be transferred to another pharmacy without having to go back to their practitioner to initiate the request. This revised regulation went into effect on Monday, May 29, 2022.     At a patient's request, a DEA-registered retail pharmacy can now transfer an electronic prescription for a controlled substance (schedules II-V) to another DEA-registered retail pharmacy. Prior to this change, patients would have to go through their practitioner to  cancel their prescription and have it re-issued to a different pharmacy. The process was taxing and time consuming for both patients and practitioners.    The Drug Enforcement Administration [facility]) published its intent to revise the process for transferring electronic prescriptions on August 20, 2020.  The final rule was published in the federal register on April 27, 2022 and went into effect 30 days later.  Under the final rule, a prescription can only be transferred once between pharmacies, and only if allowed under existing state or other applicable law. The prescription must remain in its electronic form; may not be altered in any way; and the transfer must be communicated directly between two licensed pharmacists. It's important to note, any authorized refills transfer with the original prescription, which means the entire prescription will be filled at the same pharmacy".  Reference: [email or web address].is [facility] website announcement)  [email or web address] [employer] of Justice)   [employer] / Vol. 88, No. 143 / Thursday, April 27, 2022 / Rules and Regulations DEPARTMENT OF JUSTICE  Drug Enforcement Administration  21 CFR Part 1306  [Docket No. [id number]]  RIN [id number] Transfer of Electronic Prescriptions for Schedules II-V Controlled Substances Between Pharmacies for Initial Filling  ____________________________________________________________________________________________  ____________________________________________________________________________________________  Transfer of Pain Medication between Pharmacies  Re: 2023 DEA Clarification on existing regulation  Published on DEA Website: June 02, 2022  Title: Revised Regulation Allows DEA-Registered Pharmacies to Electrical engineer Prescriptions at a Patient's  Request DEA Headquarters Division - [employer]  "Patients now have the ability to request their electronic prescription be transferred to another pharmacy without having to go back to their practitioner to initiate the request. This revised regulation went into effect on Monday, May 29, 2022.     At a patient's request, a DEA-registered retail pharmacy can now transfer an electronic prescription for a controlled substance (schedules II-V) to another DEA-registered retail pharmacy. Prior to this change, patients would have to go through their practitioner to cancel their prescription and have it re-issued to a different pharmacy. The process was taxing and time consuming for both patients and practitioners.    The Drug Enforcement Administration [facility]) published its intent to revise the process for transferring electronic prescriptions on August 20, 2020.  The final rule was published in the federal register on April 27, 2022 and went into effect 30 days later.  Under the final rule, a prescription can only be transferred once between pharmacies, and only if allowed under existing state or other applicable law. The prescription must remain in its electronic form; may not be altered in any way; and the transfer must be communicated directly between two licensed pharmacists. It's important to note, any authorized refills transfer with the original prescription, which means the entire prescription will be filled at the same pharmacy."    REFERENCES: 1. DEA website announcement [email or web address]  2. Department of Justice website  [email or web address]  3. DEPARTMENT OF JUSTICE Drug Enforcement Administration 21 CFR Part 1306 [Docket No. [id number]] RIN [id number] "Transfer of Electronic Prescriptions for Schedules II-V Controlled Substances  Between Pharmacies for Initial Filling"  ____________________________________________________________________________________________     _______________________________________________________________________  Medication Rules  Purpose: To inform patients, and their family members, of our medication rules and regulations.  Applies to: All patients receiving prescriptions from our practice (written or electronic).  Pharmacy of record: This is the pharmacy where your electronic prescriptions will be sent. Make sure we have the correct one.  Electronic prescriptions: In compliance with the [facility] Strengthen Opioid Misuse Prevention (STOP) Act of 2017 (Session Conni Elliot [phone]), effective October 02, 2018, all controlled substances must be electronically prescribed. Written prescriptions, faxing, or calling prescriptions to a pharmacy will no longer be done.  Prescription refills: These will be provided only during in-person appointments. No medications will be renewed without a "face-to-face" evaluation with your provider. Applies to all prescriptions.  NOTE: The following applies primarily to controlled substances (Opioid* Pain Medications).   Type of encounter (visit): For patients receiving controlled substances, face-to-face visits are required. (Not an option and not up to the patient.)  Patient's responsibilities: Pain Pills: Bring all pain pills to every appointment (except for procedure appointments). Pill Bottles: Bring pills in original pharmacy bottle. Bring bottle, even if empty. Always bring the bottle of the most recent fill.  Medication refills: You are responsible for knowing and keeping track of what medications you are taking and when is it that you will need a refill. The day before your appointment: write a list of all prescriptions that need to be refilled. The day of the appointment: give the list to the admitting nurse. Prescriptions will be written only  during appointments. No prescriptions will be written on procedure days. If you forget a  medication: it will not be "Called in", "Faxed", or "electronically sent". You will need to get another appointment to get these prescribed. No early refills. Do not call asking to have your prescription filled early. Partial  or short prescriptions: Occasionally your pharmacy may not have enough pills to fill your prescription.  NEVER ACCEPT a partial fill or a prescription that is short of the total amount of pills that you were prescribed.  With controlled substances the law allows 72 hours for the pharmacy to complete the prescription.  If the prescription is not completed within 72 hours, the pharmacist will require a new prescription to be written. This means that you will be short on your medicine and we WILL NOT send another prescription to complete your original prescription.  Instead, request the pharmacy to send a carrier to a nearby branch to get enough medication to provide you with your full prescription. Prescription Accuracy: You are responsible for carefully inspecting your prescriptions before leaving our office. Have the discharge nurse carefully go over each prescription with you, before taking them home. Make sure that your name is accurately spelled, that your address is correct. Check the name and dose of your medication to make sure it is accurate. Check the number of pills, and the written instructions to make sure they are clear and accurate. Make sure that you are given enough medication to last until your next medication refill appointment. Taking Medication: Take medication as prescribed. When it comes to controlled substances, taking less pills or less frequently than prescribed is permitted and encouraged. Never take more pills than instructed. Never take the medication more frequently than prescribed.  Inform other Doctors: Always inform, all of your healthcare providers, of all the  medications you take. Pain Medication from other Providers: You are not allowed to accept any additional pain medication from any other Doctor or Healthcare provider. There are two exceptions to this rule. (see below) In the event that you require additional pain medication, you are responsible for notifying us, as stated below. Cough Medicine: Often these contain an opioid, such as codeine or hydrocodone. Never accept or take cough medicine containing these opioids if you are already taking an opioid* medication. The combination may cause respiratory failure and death. Medication Agreement: You are responsible for carefully reading and following our Medication Agreement. This must be signed before receiving any prescriptions from our practice. Safely store a copy of your signed Agreement. Violations to the Agreement will result in no further prescriptions. (Additional copies of our Medication Agreement are available upon request.) Laws, Rules, & Regulations: All patients are expected to follow all [address] and [employer], [employer], Rules, [place] [employer]. Ignorance of the Laws does not constitute a valid excuse.  Illegal drugs and Controlled Substances: The use of illegal substances (including, but not limited to marijuana and its derivatives) and/or the illegal use of any controlled substances is strictly prohibited. Violation of this rule may result in the immediate and permanent discontinuation of any and all prescriptions being written by our practice. The use of any illegal substances is prohibited. Adopted CDC guidelines & recommendations: Target dosing levels will be at or below 60 MME/day. Use of benzodiazepines** is not recommended.  Exceptions: There are only two exceptions to the rule of not receiving pain medications from other Healthcare Providers. Exception #1 (Emergencies): In the event of an emergency (i.e.: accident requiring emergency care), you are allowed to receive additional pain  medication. However, you are responsible for: As soon as  you are able, call our office [phone], at any time of the day or night, and leave a message stating your name, the date and nature of the emergency, and the name and dose of the medication prescribed. In the event that your call is answered by a member of our staff, make sure to document and save the date, time, and the name of the person that took your information.  Exception #2 (Planned Surgery): In the event that you are scheduled by another doctor or dentist to have any type of surgery or procedure, you are allowed (for a period no longer than 30 days), to receive additional pain medication, for the acute post-op pain. However, in this case, you are responsible for picking up a copy of our "Post-op Pain Management for Surgeons" handout, and giving it to your surgeon or dentist. This document is available at our office, and does not require an appointment to obtain it. Simply go to our office during business hours (Monday-Thursday from 8:00 AM to 4:00 PM) (Friday 8:00 AM to 12:00 Noon) or if you have a scheduled appointment with [place], prior to your surgery, and ask for it by name. In addition, you are responsible for: calling our office [phone], at any time of the day or night, and leaving a message stating your name, name of your surgeon, type of surgery, and date of procedure or surgery. Failure to comply with your responsibilities may result in termination of therapy involving the controlled substances. Medication Agreement Violation. Following the above rules, including your responsibilities will help you in avoiding a Medication Agreement Violation ("Breaking your Pain Medication Contract").  Consequences:  Not following the above rules may result in permanent discontinuation of medication prescription therapy.  *Opioid medications include: morphine, codeine, oxycodone, oxymorphone, hydrocodone, hydromorphone, meperidine, tramadol,  tapentadol, buprenorphine, fentanyl, methadone. **Benzodiazepine medications include: diazepam (Valium), alprazolam (Xanax), clonazepam (Klonopine), lorazepam (Ativan), clorazepate (Tranxene), chlordiazepoxide (Librium), estazolam (Prosom), oxazepam (Serax), temazepam (Restoril), triazolam (Halcion) (Last updated: 07/25/2022) ______________________________________________________________________    ______________________________________________________________________  Medication Recommendations and Reminders  Applies to: All patients receiving prescriptions (written and/or electronic).  Medication Rules & Regulations: You are responsible for reading, knowing, and following our "Medication Rules" document. These exist for your safety and that of others. They are not flexible and neither are we. Dismissing or ignoring them is an act of "non-compliance" that may result in complete and irreversible termination of such medication therapy. For safety reasons, "non-compliance" will not be tolerated. As with the [place] fundamental legal principle of "ignorance of the law is no defense", we will accept no excuses for not having read and knowing the content of documents provided to you by our practice.  Pharmacy of record:  Definition: This is the pharmacy where your electronic prescriptions will be sent.  We do not endorse any particular pharmacy. It is up to you and your insurance to decide what pharmacy to use.  We do not restrict you in your choice of pharmacy. However, once we write for your prescriptions, we will NOT be re-sending more prescriptions to fix restricted supply problems created by your pharmacy, or your insurance.  The pharmacy listed in the electronic medical record should be the one where you want electronic prescriptions to be sent. If you choose to change pharmacy, simply notify our nursing staff. Changes will be made only during your regular appointments and not over the  phone.  Recommendations: Keep all of your pain medications in a safe place, under lock and key, even  if you live alone. We will NOT replace lost, stolen, or damaged medication. We do not accept "Police Reports" as proof of medications having been stolen. After you fill your prescription, take 1 week's worth of pills and put them away in a safe place. You should keep a separate, properly labeled bottle for this purpose. The remainder should be kept in the original bottle. Use this as your primary supply, until it runs out. Once it's gone, then you know that you have 1 week's worth of medicine, and it is time to come in for a prescription refill. If you do this correctly, it is unlikely that you will ever run out of medicine. To make sure that the above recommendation works, it is very important that you make sure your medication refill appointments are scheduled at least 1 week before you run out of medicine. To do this in an effective manner, make sure that you do not leave the office without scheduling your next medication management appointment. Always ask the nursing staff to show you in your prescription , when your medication will be running out. Then arrange for the receptionist to get you a return appointment, at least 7 days before you run out of medicine. Do not wait until you have 1 or 2 pills left, to come in. This is very poor planning and does not take into consideration that we may need to cancel appointments due to bad weather, sickness, or emergencies affecting our staff. DO NOT ACCEPT A "Partial Fill": If for any reason your pharmacy does not have enough pills/tablets to completely fill or refill your prescription, do not allow for a "partial fill". The law allows the pharmacy to complete that prescription within 72 hours, without requiring a new prescription. If they do not fill the rest of your prescription within those 72 hours, you will need a separate prescription to fill the remaining  amount, which we will NOT provide. If the reason for the partial fill is your insurance, you will need to talk to the pharmacist about payment alternatives for the remaining tablets, but again, DO NOT ACCEPT A PARTIAL FILL, unless you can trust your pharmacist to obtain the remainder of the pills within 72 hours.  Prescription refills and/or changes in medication(s):  Prescription refills, and/or changes in dose or medication, will be conducted only during scheduled medication management appointments. (Applies to both, written and electronic prescriptions.) No refills on procedure days. No medication will be changed or started on procedure days. No changes, adjustments, and/or refills will be conducted on a procedure day. Doing so will interfere with the diagnostic portion of the procedure. No phone refills. No medications will be "called into the pharmacy". No Fax refills. No weekend refills. No Holliday refills. No after hours refills.  Remember:  Business hours are:  Monday to Thursday 8:00 AM to 4:00 PM Provider's Schedule: Delano Metz, MD - Appointments are:  Medication management: Monday and Wednesday 8:00 AM to 4:00 PM Procedure day: Tuesday and Thursday 7:30 AM to 4:00 PM Edward Jolly, MD - Appointments are:  Medication management: Tuesday and Thursday 8:00 AM to 4:00 PM Procedure day: Monday and Wednesday 7:30 AM to 4:00 PM (Last update: 07/25/2022) ______________________________________________________________________   ____________________________________________________________________________________________  Naloxone Nasal Spray  Why am I receiving this medication? [place] STOP ACT requires that all patients taking high dose opioids or at risk of opioids respiratory depression, be prescribed an opioid reversal agent, such as Naloxone (AKA: Narcan).  What is this medication? NALOXONE (  nal OX one) treats opioid overdose, which causes slow or shallow breathing,  severe drowsiness, or trouble staying awake. Call emergency services after using this medication. You may need additional treatment. Naloxone works by reversing the effects of opioids. It belongs to a group of medications called opioid blockers.  COMMON BRAND NAME(S): Kloxxado, Narcan  What should I tell my care team before I take this medication? They need to know if you have any of these conditions: Heart disease Substance use disorder An unusual or allergic reaction to naloxone, other medications, foods, dyes, or preservatives Pregnant or trying to get pregnant Breast-feeding  When to use this medication? This medication is to be used for the treatment of respiratory depression (less than 8 breaths per minute) secondary to opioid overdose.   How to use this medication? This medication is for use in the nose. Lay the person on their back. Support their neck with your hand and allow the head to tilt back before giving the medication. The nasal spray should be given into 1 nostril. After giving the medication, move the person onto their side. Do not remove or test the nasal spray until ready to use. Get emergency medical help right away after giving the first dose of this medication, even if the person wakes up. You should be familiar with how to recognize the signs and symptoms of a narcotic overdose. If more doses are needed, give the additional dose in the other nostril. Talk to your care team about the use of this medication in children. While this medication may be prescribed for children as young as newborns for selected conditions, precautions do apply.  Naloxone Overdosage: If you think you have taken too much of this medicine contact a poison control center or emergency room at once.  NOTE: This medicine is only for you. Do not share this medicine with others.  What if I miss a dose? This does not apply.  What may interact with this medication? This is only used during an  emergency. No interactions are expected during emergency use. This list may not describe all possible interactions. Give your health care provider a list of all the medicines, herbs, non-prescription drugs, or dietary supplements you use. Also tell them if you smoke, drink alcohol, or use illegal drugs. Some items may interact with your medicine.  What should I watch for while using this medication? Keep this medication ready for use in the case of an opioid overdose. Make sure that you have the phone number of your care team and local hospital ready. You may need to have additional doses of this medication. Each nasal spray contains a single dose. Some emergencies may require additional doses. After use, bring the treated person to the nearest hospital or call 911. Make sure the treating care team knows that the person has received a dose of this medication. You will receive additional instructions on what to do during and after use of this medication before an emergency occurs.  What side effects may I notice from receiving this medication? Side effects that you should report to your care team as soon as possible: Allergic reactions--skin rash, itching, hives, swelling of the face, lips, tongue, or throat Side effects that usually do not require medical attention (report these to your care team if they continue or are bothersome): Constipation Dryness or irritation inside the nose Headache Increase in blood pressure Muscle spasms Stuffy nose Toothache This list may not describe all possible side effects. Call your doctor for  medical advice about side effects. You may report side effects to FDA at 1-800-FDA-1088.  Where should I keep my medication? Because this is an emergency medication, you should keep it with you at all times.  Keep out of the reach of children and pets. Store between 20 and 25 degrees C (68 and 77 degrees F). Do not freeze. Throw away any unused medication after the  expiration date. Keep in original box until ready to use.  NOTE: This sheet is a summary. It may not cover all possible information. If you have questions about this medicine, talk to your doctor, pharmacist, or health care provider.   2023 Elsevier/Gold Standard (2021-05-27 00:00:00)  ____________________________________________________________________________________________

## 2023-07-11 ENCOUNTER — Encounter: Payer: Self-pay | Admitting: Pain Medicine

## 2023-07-11 ENCOUNTER — Ambulatory Visit: Payer: Medicare Other | Attending: Pain Medicine | Admitting: Pain Medicine

## 2023-07-11 ENCOUNTER — Encounter: Payer: Self-pay | Admitting: Pharmacist

## 2023-07-11 ENCOUNTER — Other Ambulatory Visit: Payer: Medicare Other | Admitting: Pharmacist

## 2023-07-11 VITALS — BP 167/74 | HR 78 | Temp 97.0°F | Resp 16 | Ht 60.0 in | Wt 166.0 lb

## 2023-07-11 DIAGNOSIS — M545 Low back pain, unspecified: Secondary | ICD-10-CM | POA: Diagnosis not present

## 2023-07-11 DIAGNOSIS — M79604 Pain in right leg: Secondary | ICD-10-CM | POA: Insufficient documentation

## 2023-07-11 DIAGNOSIS — M542 Cervicalgia: Secondary | ICD-10-CM | POA: Insufficient documentation

## 2023-07-11 DIAGNOSIS — Z79899 Other long term (current) drug therapy: Secondary | ICD-10-CM | POA: Insufficient documentation

## 2023-07-11 DIAGNOSIS — G8929 Other chronic pain: Secondary | ICD-10-CM | POA: Insufficient documentation

## 2023-07-11 DIAGNOSIS — M79605 Pain in left leg: Secondary | ICD-10-CM | POA: Diagnosis not present

## 2023-07-11 DIAGNOSIS — G894 Chronic pain syndrome: Secondary | ICD-10-CM | POA: Insufficient documentation

## 2023-07-11 DIAGNOSIS — Z79891 Long term (current) use of opiate analgesic: Secondary | ICD-10-CM | POA: Diagnosis not present

## 2023-07-11 DIAGNOSIS — M47816 Spondylosis without myelopathy or radiculopathy, lumbar region: Secondary | ICD-10-CM | POA: Insufficient documentation

## 2023-07-11 MED ORDER — HYDROCODONE-ACETAMINOPHEN 5-325 MG PO TABS: 1.0000 | ORAL_TABLET | Freq: Two times a day (BID) | ORAL | 0 refills | Status: DC | PRN

## 2023-07-11 NOTE — Progress Notes (Signed)
07/11/2023 Name: Carmen [name] MRN: [id number] DOB: Oct 02, 1941  No chief complaint on file.   Carmen [name] is a 82 y.o. year old female who presented for a telephone visit.   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    Subjective:  Care Team: Primary Care Provider: Mort Sawyers, FNP  Psychiatrist: Delight Ovens; Next Scheduled Visit: 07/24/23, 08/07/23  Medication Access/Adherence  Current Pharmacy:  [facility] [address] [place] - [address] [place] [address] Phone: [phone] Fax: [phone]  [facility] [phone] - [address] [place] - [address] [place] [address] Phone: [phone] Fax: [phone]  [facility] [address] [place] [address] Phone: [phone] Fax: [phone]   Patient reports affordability concerns with their medications: No  Patient reports access/transportation concerns to their pharmacy: No  Patient reports adherence concerns with their medications:  No   Spoke to patient's caregiver/daughter. Daughter reports that patient has agreed to see psychiatry. Initial visit with psychiatry was last week 07/02/23, which patient presented to alone (without daughter). Daughter confirms that psychiatrist manages/prescribed pharm/non-pharm treatments. Psych did not recommend medication changes at initial visit. Did not discuss antidepressant medication at initial visit. Does have a follow up visit scheduled with psych every 2 weeks for the next couple of months.   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 Daughter feels these changes have contributed significantly to patient's mood/temperament changes. After last stroke, patient was started on Depakote which daughter feels has  made a significant improvement for patient. Currently taking 1 tablet nightly.    Objective: DEPRESSION SCREENING (06/14/23 office visit)     06/14/2023   11:33 AM 04/11/2023    8:04 AM 04/02/2023   10:33 AM 02/21/2023   11:05 AM 01/15/2023    8:12 AM 01/09/2023   11:41 AM 12/08/2022    8:44 AM  PHQ 2/9 Scores  PHQ - 2 Score 6 0 0 0 0 0 1  PHQ- 9 Score 26                Orders placed or performed in visit on 04/06/23   EKG 12-Lead    Lab Results  Component Value Date   CREATININE 0.85 02/02/2023   BUN 14 02/02/2023   NA 139 02/02/2023   K 4.5 02/02/2023   CL 101 02/02/2023   CO2 24 02/02/2023    Lab Results  Component Value Date   CHOL 93 06/14/2023   HDL 46.50 06/14/2023   LDLCALC 22 06/14/2023   LDLDIRECT 99.0 07/26/2021   TRIG 125.0 06/14/2023   CHOLHDL 2 06/14/2023    Assessment/Plan:   Depression/Anxiety: Currently uncontrolled per PHQ9 score of 26. Daughter/caregiver perceives great benefit with Depakote. Has recently established with Psych (last week) and has q2wk follow up scheduled. We agreed upon short-term plan of continuing with psych. Daughter will reach out with any medication changes made by Psych team.   We did discuss antidepressant medication generally, with focus on risks of Qtc prolongation. Advised her to contact myself or PCP should antidepressant medication be added by Psych.  - Recommend to continue with Psychology for additional evaluation  General Considerations in Qtc-prolongation:  - All SSRI/SNRI will have some degree of Qtc prolongation risk. Avoid citalopram given BBW for potential Qtc prolongation. Would consider EKG monitoring  should SSRi/SNRI be added in the future. 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  - To reduce risk of TDP, ensure electrolytes remain WNL (esp Mg, K). Reasonable goals to reduce arrhythmia risk K 4, Mg 2.  - Avoid TCA    Follow Up Plan: As needed with clinical pharmacist. Pt has personal office # and MyChart access for  messaging.   Future Appointments  Date Time Provider Department Center  07/24/2023  8:00 AM Delight Ovens, LCSW [facility] None  08/07/2023  8:00 AM Delight Ovens, LCSW [facility] None  09/27/2023  8:00 AM Sondra Barges, PA-C [facility] None  10/17/2023  8:00 AM Delano Metz, MD [facility] None  11/07/2023  2:30 PM Micki Riley, MD [facility] None   Loree Fee, PharmD Clinical Pharmacist [facility] [phone]

## 2023-07-11 NOTE — Progress Notes (Signed)
Attempted to contact patient for scheduled appointment for medication management. Left HIPAA compliant message for patient to return my call at their convenience.   

## 2023-07-11 NOTE — Progress Notes (Signed)
Nursing Pain Medication Assessment:  Safety precautions to be maintained throughout the outpatient stay will include: orient to surroundings, keep bed in low position, maintain call bell within reach at all times, provide assistance with transfer out of bed and ambulation.  Medication Inspection Compliance: Pill count conducted under aseptic conditions, in front of the patient. Neither the pills nor the bottle was removed from the patient's sight at any time. Once count was completed pills were immediately returned to the patient in their original bottle.  Medication: Hydrocodone/APAP Pill/Patch Count:  47 of 60 pills remain Pill/Patch Appearance: Markings consistent with prescribed medication Bottle Appearance: Standard pharmacy container. Clearly labeled. Filled Date: 10 / 06 / 2024 Last Medication intake:  Today

## 2023-07-12 DIAGNOSIS — R531 Weakness: Secondary | ICD-10-CM | POA: Diagnosis not present

## 2023-07-12 DIAGNOSIS — E785 Hyperlipidemia, unspecified: Secondary | ICD-10-CM | POA: Diagnosis not present

## 2023-07-12 DIAGNOSIS — F32A Depression, unspecified: Secondary | ICD-10-CM | POA: Diagnosis not present

## 2023-07-12 DIAGNOSIS — I10 Essential (primary) hypertension: Secondary | ICD-10-CM | POA: Diagnosis not present

## 2023-07-12 DIAGNOSIS — Z556 Problems related to health literacy: Secondary | ICD-10-CM | POA: Diagnosis not present

## 2023-07-12 DIAGNOSIS — Z7902 Long term (current) use of antithrombotics/antiplatelets: Secondary | ICD-10-CM | POA: Diagnosis not present

## 2023-07-12 DIAGNOSIS — D649 Anemia, unspecified: Secondary | ICD-10-CM | POA: Diagnosis not present

## 2023-07-12 DIAGNOSIS — E039 Hypothyroidism, unspecified: Secondary | ICD-10-CM | POA: Diagnosis not present

## 2023-07-12 DIAGNOSIS — Z5181 Encounter for therapeutic drug level monitoring: Secondary | ICD-10-CM | POA: Diagnosis not present

## 2023-07-12 DIAGNOSIS — M79604 Pain in right leg: Secondary | ICD-10-CM | POA: Diagnosis not present

## 2023-07-12 DIAGNOSIS — H9191 Unspecified hearing loss, right ear: Secondary | ICD-10-CM | POA: Diagnosis not present

## 2023-07-12 DIAGNOSIS — H539 Unspecified visual disturbance: Secondary | ICD-10-CM | POA: Diagnosis not present

## 2023-07-12 DIAGNOSIS — I69398 Other sequelae of cerebral infarction: Secondary | ICD-10-CM | POA: Diagnosis not present

## 2023-07-12 DIAGNOSIS — W19XXXD Unspecified fall, subsequent encounter: Secondary | ICD-10-CM | POA: Diagnosis not present

## 2023-07-18 DIAGNOSIS — I69398 Other sequelae of cerebral infarction: Secondary | ICD-10-CM | POA: Diagnosis not present

## 2023-07-18 DIAGNOSIS — E039 Hypothyroidism, unspecified: Secondary | ICD-10-CM | POA: Diagnosis not present

## 2023-07-18 DIAGNOSIS — W19XXXD Unspecified fall, subsequent encounter: Secondary | ICD-10-CM | POA: Diagnosis not present

## 2023-07-18 DIAGNOSIS — Z5181 Encounter for therapeutic drug level monitoring: Secondary | ICD-10-CM | POA: Diagnosis not present

## 2023-07-18 DIAGNOSIS — E785 Hyperlipidemia, unspecified: Secondary | ICD-10-CM | POA: Diagnosis not present

## 2023-07-18 DIAGNOSIS — H9191 Unspecified hearing loss, right ear: Secondary | ICD-10-CM | POA: Diagnosis not present

## 2023-07-18 DIAGNOSIS — H539 Unspecified visual disturbance: Secondary | ICD-10-CM | POA: Diagnosis not present

## 2023-07-18 DIAGNOSIS — Z556 Problems related to health literacy: Secondary | ICD-10-CM | POA: Diagnosis not present

## 2023-07-18 DIAGNOSIS — R531 Weakness: Secondary | ICD-10-CM | POA: Diagnosis not present

## 2023-07-18 DIAGNOSIS — Z7902 Long term (current) use of antithrombotics/antiplatelets: Secondary | ICD-10-CM | POA: Diagnosis not present

## 2023-07-18 DIAGNOSIS — F32A Depression, unspecified: Secondary | ICD-10-CM | POA: Diagnosis not present

## 2023-07-18 DIAGNOSIS — D649 Anemia, unspecified: Secondary | ICD-10-CM | POA: Diagnosis not present

## 2023-07-18 DIAGNOSIS — M79604 Pain in right leg: Secondary | ICD-10-CM | POA: Diagnosis not present

## 2023-07-18 DIAGNOSIS — I10 Essential (primary) hypertension: Secondary | ICD-10-CM | POA: Diagnosis not present

## 2023-07-22 ENCOUNTER — Other Ambulatory Visit: Payer: Self-pay | Admitting: Internal Medicine

## 2023-07-24 ENCOUNTER — Ambulatory Visit: Payer: Medicare Other | Admitting: Clinical

## 2023-07-24 ENCOUNTER — Ambulatory Visit (INDEPENDENT_AMBULATORY_CARE_PROVIDER_SITE_OTHER): Payer: Medicare Other | Admitting: Psychology

## 2023-07-24 DIAGNOSIS — F3289 Other specified depressive episodes: Secondary | ICD-10-CM

## 2023-07-24 NOTE — Progress Notes (Signed)
[facility]arm[facility] [name] Behavioral Health Counselor/Therapist Progress Note  Patient ID: Carmen [name], MRN: [id number]    Date: 07/24/23  Time Spent: 8:03  am - 8:59 am : 56 Minutes  Treatment Type: Individual Therapy.  Reported Symptoms: Depression Carmen Anxiety.   Mental Status Exam: Appearance:  Well Groomed     Behavior: Appropriate  Motor: Requires ambulation tool  Speech/Language:  Clear Carmen Coherent Carmen Accent  Affect: Congruent  Mood: dysthymic  Thought process: normal  Thought content:   WNL  Sensory/Perceptual disturbances:   WNL  Orientation: oriented to person, place, time/date, Carmen situation  Attention: Good  Concentration: Good  Memory: WNL  Fund of knowledge:  Good  Insight:   Good  Judgment:  Good  Impulse Control: Good   Risk Assessment: Danger to Self:  No Self-injurious Behavior: No Danger to Others: No Duty to Warn:no Physical Aggression / Violence:No  Access to Firearms a concern: No  Gang Involvement:No   Subjective:   Carmen [name] participated in the session, in person in the office with the therapist, Carmen consented to treatment Carmen [name] reviewed the events of the past week. We reviewed numerous treatment approaches including CBT, BA, Problem Solving, Carmen Solution focused therapy. Psych-education regarding the Caasi's diagnosis of Other depression was provided during the session. We discussed [name]'s goals treatment goals which include Carmen interpersonal [name], Carmen [name], Carmen [name], Carmen [name], Carmen [name], Carmen [name], Carmen [name]. Carmen [name] provided verbal approval of the treatment plan.    Interventions: Psycho-education & Goal Setting.   Diagnosis:   Other depression  Psychiatric Treatment: Yes , Via PCP.   Treatment Plan:  Client Abilities/Strengths Carmen [name] is self-aware, expressive, Carmen motivated for change.   Support System: Family.   Client  Treatment Preferences Outpatient Therapy.   Client Statement of Needs Carmen [name], Carmen [name], Carmen [name], Carmen [name], Carmen [name], Carmen [name], Carmen [name], Carmen [name], Carmen [name], Carmen be [name] assertive.   Treatment Level Weekly  Symptoms  Anxiety: feeling nervous, difficulty managing worry, worrying about different things, trouble relaxing, restlessness, irritability.    (Status: maintained) Depression: Loss of interest, feeling down, lethargy, poor appetite, feeling bad about self, trouble concentrating.  (Status: maintained)  Goals:   Carmen [name] experiences symptoms of depression Carmen anxiety.  Treatment plan signed Carmen available on s-drive:  Yes    Target Date: 07/23/24 Frequency: Weekly  Progress: 0 Modality: individual    Therapist will provide referrals for additional resources as appropriate.  Therapist will provide psycho-education regarding Carmen [name]'s diagnosis Carmen corresponding treatment approaches Carmen interventions. Licensed Clinical Social Worker, [place], LCSW will support the patient's ability to achieve the goals identified. will employ CBT, BA, Problem-solving, Solution Focused, Mindfulness,  coping [name], & other evidenced-based practices will be used to promote progress towards healthy functioning to help Carmen decrease symptoms associated with her diagnosis.   Reduce overall level, frequency, Carmen intensity of the feelings of depression, anxiety evidenced by decreased overall symptoms from 6 to 7 days/week to 0 to 1 days/week per client report for at least 3 consecutive months. Verbally express understanding of the relationship between feelings of depression, anxiety Carmen their impact on thinking patterns Carmen behaviors. Verbalize an understanding of the role that distorted thinking plays in creating fears, excessive  worry, Carmen ruminations.  [name] participated in the creation of the treatment plan)    Delight Ovens, LCSW

## 2023-07-25 DIAGNOSIS — R531 Weakness: Secondary | ICD-10-CM | POA: Diagnosis not present

## 2023-07-25 DIAGNOSIS — M79604 Pain in right leg: Secondary | ICD-10-CM | POA: Diagnosis not present

## 2023-07-25 DIAGNOSIS — F32A Depression, unspecified: Secondary | ICD-10-CM | POA: Diagnosis not present

## 2023-07-25 DIAGNOSIS — W19XXXD Unspecified fall, subsequent encounter: Secondary | ICD-10-CM | POA: Diagnosis not present

## 2023-07-25 DIAGNOSIS — H9191 Unspecified hearing loss, right ear: Secondary | ICD-10-CM | POA: Diagnosis not present

## 2023-07-25 DIAGNOSIS — E785 Hyperlipidemia, unspecified: Secondary | ICD-10-CM | POA: Diagnosis not present

## 2023-07-25 DIAGNOSIS — I10 Essential (primary) hypertension: Secondary | ICD-10-CM | POA: Diagnosis not present

## 2023-07-25 DIAGNOSIS — D649 Anemia, unspecified: Secondary | ICD-10-CM | POA: Diagnosis not present

## 2023-07-25 DIAGNOSIS — H539 Unspecified visual disturbance: Secondary | ICD-10-CM | POA: Diagnosis not present

## 2023-07-25 DIAGNOSIS — E039 Hypothyroidism, unspecified: Secondary | ICD-10-CM | POA: Diagnosis not present

## 2023-07-25 DIAGNOSIS — I69398 Other sequelae of cerebral infarction: Secondary | ICD-10-CM | POA: Diagnosis not present

## 2023-07-25 DIAGNOSIS — Z5181 Encounter for therapeutic drug level monitoring: Secondary | ICD-10-CM | POA: Diagnosis not present

## 2023-07-25 DIAGNOSIS — Z7902 Long term (current) use of antithrombotics/antiplatelets: Secondary | ICD-10-CM | POA: Diagnosis not present

## 2023-07-25 DIAGNOSIS — Z556 Problems related to health literacy: Secondary | ICD-10-CM | POA: Diagnosis not present

## 2023-07-30 ENCOUNTER — Telehealth: Payer: Self-pay | Admitting: Family

## 2023-07-30 DIAGNOSIS — E039 Hypothyroidism, unspecified: Secondary | ICD-10-CM | POA: Diagnosis not present

## 2023-07-30 DIAGNOSIS — I10 Essential (primary) hypertension: Secondary | ICD-10-CM | POA: Diagnosis not present

## 2023-07-30 DIAGNOSIS — E785 Hyperlipidemia, unspecified: Secondary | ICD-10-CM | POA: Diagnosis not present

## 2023-07-30 DIAGNOSIS — H9191 Unspecified hearing loss, right ear: Secondary | ICD-10-CM | POA: Diagnosis not present

## 2023-07-30 DIAGNOSIS — R531 Weakness: Secondary | ICD-10-CM | POA: Diagnosis not present

## 2023-07-30 DIAGNOSIS — Z5181 Encounter for therapeutic drug level monitoring: Secondary | ICD-10-CM | POA: Diagnosis not present

## 2023-07-30 DIAGNOSIS — W19XXXD Unspecified fall, subsequent encounter: Secondary | ICD-10-CM | POA: Diagnosis not present

## 2023-07-30 DIAGNOSIS — M79604 Pain in right leg: Secondary | ICD-10-CM | POA: Diagnosis not present

## 2023-07-30 DIAGNOSIS — Z556 Problems related to health literacy: Secondary | ICD-10-CM | POA: Diagnosis not present

## 2023-07-30 DIAGNOSIS — I69398 Other sequelae of cerebral infarction: Secondary | ICD-10-CM | POA: Diagnosis not present

## 2023-07-30 DIAGNOSIS — Z7902 Long term (current) use of antithrombotics/antiplatelets: Secondary | ICD-10-CM | POA: Diagnosis not present

## 2023-07-30 DIAGNOSIS — D649 Anemia, unspecified: Secondary | ICD-10-CM | POA: Diagnosis not present

## 2023-07-30 DIAGNOSIS — H539 Unspecified visual disturbance: Secondary | ICD-10-CM | POA: Diagnosis not present

## 2023-07-30 DIAGNOSIS — F32A Depression, unspecified: Secondary | ICD-10-CM | POA: Diagnosis not present

## 2023-07-30 NOTE — Telephone Encounter (Signed)
Form has been faxed back to [facility]. Home Health is aware. Nothing further was needed.

## 2023-07-30 NOTE — Telephone Encounter (Signed)
Spoke with HH. States that pt needs an order sent to [facility] for a rolling walker. HH was following up on the request. Advised that I have not received this order. They are going to have it re faxed. Will await fax.

## 2023-07-30 NOTE — Telephone Encounter (Signed)
Carmen [name] from [facility] called in and stated she needs a called back from [place] soon as possible Carmen [name] can be reached at [phone]

## 2023-07-30 NOTE — Telephone Encounter (Signed)
Fax has been received and placed in Tabitha's in box.

## 2023-07-30 NOTE — Telephone Encounter (Signed)
I agree with this order, please put my signature via stamp if acceptable.

## 2023-08-06 DIAGNOSIS — H539 Unspecified visual disturbance: Secondary | ICD-10-CM | POA: Diagnosis not present

## 2023-08-06 DIAGNOSIS — Z5181 Encounter for therapeutic drug level monitoring: Secondary | ICD-10-CM | POA: Diagnosis not present

## 2023-08-06 DIAGNOSIS — E039 Hypothyroidism, unspecified: Secondary | ICD-10-CM | POA: Diagnosis not present

## 2023-08-06 DIAGNOSIS — W19XXXD Unspecified fall, subsequent encounter: Secondary | ICD-10-CM | POA: Diagnosis not present

## 2023-08-06 DIAGNOSIS — Z7902 Long term (current) use of antithrombotics/antiplatelets: Secondary | ICD-10-CM | POA: Diagnosis not present

## 2023-08-06 DIAGNOSIS — E785 Hyperlipidemia, unspecified: Secondary | ICD-10-CM | POA: Diagnosis not present

## 2023-08-06 DIAGNOSIS — H9191 Unspecified hearing loss, right ear: Secondary | ICD-10-CM | POA: Diagnosis not present

## 2023-08-06 DIAGNOSIS — I10 Essential (primary) hypertension: Secondary | ICD-10-CM | POA: Diagnosis not present

## 2023-08-06 DIAGNOSIS — D649 Anemia, unspecified: Secondary | ICD-10-CM | POA: Diagnosis not present

## 2023-08-06 DIAGNOSIS — F32A Depression, unspecified: Secondary | ICD-10-CM | POA: Diagnosis not present

## 2023-08-06 DIAGNOSIS — I69398 Other sequelae of cerebral infarction: Secondary | ICD-10-CM | POA: Diagnosis not present

## 2023-08-06 DIAGNOSIS — Z556 Problems related to health literacy: Secondary | ICD-10-CM | POA: Diagnosis not present

## 2023-08-06 DIAGNOSIS — R531 Weakness: Secondary | ICD-10-CM | POA: Diagnosis not present

## 2023-08-06 DIAGNOSIS — M79604 Pain in right leg: Secondary | ICD-10-CM | POA: Diagnosis not present

## 2023-08-07 ENCOUNTER — Ambulatory Visit: Payer: Medicare Other | Admitting: Psychology

## 2023-08-07 DIAGNOSIS — F3289 Other specified depressive episodes: Secondary | ICD-10-CM | POA: Diagnosis not present

## 2023-08-07 NOTE — Progress Notes (Signed)
[facility] Counselor/Therapist Progress Note  Patient ID: Carmen [name], MRN: [id number]    Date: 08/07/23  Time Spent: 7:59  am - 8:48 am : 49 Minutes  Treatment Type: Individual Therapy.  Reported Symptoms: Depression and Anxiety.   Mental Status Exam: Appearance:  Well Groomed     Behavior: Appropriate  Motor: Requires ambulation tool  Speech/Language:  Clear and Coherent and Accent  Affect: Congruent  Mood: normal  Thought process: normal  Thought content:   WNL  Sensory/Perceptual disturbances:   WNL  Orientation: oriented to person, place, time/date, and situation  Attention: Good  Concentration: Good  Memory: WNL  Fund of knowledge:  Good  Insight:   Good  Judgment:  Good  Impulse Control: Good   Risk Assessment: Danger to Self:  No Self-injurious Behavior: No Danger to Others: No Duty to Warn:no Physical Aggression / Violence:No  Access to Firearms a concern: No  Gang Involvement:No   Subjective:   Carmen [name] participated in the session, in person in the office with the therapist, and consented to treatment Carmen [name] reviewed the events of the past week. She noted increased socializing and suspects that her son in-law advocated for her in this regard. She noted going to [employer] and church with the support of her son in-law and daughter. She is hopeful that she will be able to go regularly to both [employer] and church going forward. She is also completing physical therapy to strengthen her body. She noted dealing with considerable pain due to her arthritis. She is under the care of her doctor who helps manage her pain. 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 Therapist encouraged Carmen [name] to create a weekly schedule and continue maintaining connection with this which includes self-care, engaging in enjoyable activities, and other meaningful activities. She noted provided history regarding  dynamics with her daughter, Carmen [name]. She noted her daughter struggling in her younger years with managing mood. She noted excitement regarding her mother in-law suite which will be completed by thanksgiving. Carmen [name] elected to cancel the follow-up appointment and noted doing well overall. Therapist discussed the typical protocol of a termination appointment but Carmen [name] declined. Therapist provided Carmen [name] with business card, should she elect to schedule an appointed. Carmen [name] was engaged and motivated during the session. She expressed commitment towards goals. Therapist praised Carmen [name] and provided supportive therapy.   Interventions: CBT and interpersonal.   Diagnosis:   Other depression  Psychiatric Treatment: Yes , Via PCP.   Treatment Plan:  Client Abilities/Strengths Carmen [name] is self-aware, expressive, and motivated for change.   Support System: Family.   Client Treatment Preferences Outpatient Therapy.   Client Statement of Needs Carmen [name] would like to improve interpersonal relationships, socializing more, attend church more regularly, bolstering communication skills, increase exercise, engage in enjoyable activities, engage in self-care, manage day-to-day stressors, improve self-esteem, and be more assertive.   Treatment Level Weekly  Symptoms  Anxiety: feeling nervous, difficulty managing worry, worrying about different things, trouble relaxing, restlessness, irritability.    (Status: maintained) Depression: Loss of interest, feeling down, lethargy, poor appetite, feeling bad about self, trouble concentrating.  (Status: maintained)  Goals:   Carmen [name] experiences symptoms of depression and anxiety.  Treatment plan signed and available on s-drive:  Yes    Target Date: 07/23/24 Frequency: Weekly  Progress: 0 Modality: individual    Therapist will provide referrals for additional resources as appropriate.  Therapist will provide psycho-education regarding Carmen [name]'s diagnosis and  corresponding  treatment approaches and interventions. Licensed Clinical Social Worker, [place], LCSW will support the patient's ability to achieve the goals identified. will employ CBT, BA, Problem-solving, Solution Focused, Mindfulness,  coping skills, & other evidenced-based practices will be used to promote progress towards healthy functioning to help manage decrease symptoms associated with her diagnosis.   Reduce overall level, frequency, and intensity of the feelings of depression, anxiety evidenced by decreased overall symptoms from 6 to 7 days/week to 0 to 1 days/week per client report for at least 3 consecutive months. Verbally express understanding of the relationship between feelings of depression, anxiety and their impact on thinking patterns and behaviors. Verbalize an understanding of the role that distorted thinking plays in creating fears, excessive worry, and ruminations.  Carmen [name] participated in the creation of the treatment plan)    Delight Ovens, LCSW

## 2023-08-14 ENCOUNTER — Encounter: Payer: Self-pay | Admitting: Family

## 2023-08-14 ENCOUNTER — Ambulatory Visit (INDEPENDENT_AMBULATORY_CARE_PROVIDER_SITE_OTHER): Payer: Medicare Other | Admitting: Family

## 2023-08-14 VITALS — BP 126/74 | HR 78 | Temp 98.2°F | Ht 60.0 in | Wt 166.0 lb

## 2023-08-14 DIAGNOSIS — R81 Glycosuria: Secondary | ICD-10-CM

## 2023-08-14 DIAGNOSIS — R3989 Other symptoms and signs involving the genitourinary system: Secondary | ICD-10-CM

## 2023-08-14 DIAGNOSIS — N3000 Acute cystitis without hematuria: Secondary | ICD-10-CM | POA: Insufficient documentation

## 2023-08-14 DIAGNOSIS — R042 Hemoptysis: Secondary | ICD-10-CM | POA: Insufficient documentation

## 2023-08-14 DIAGNOSIS — D649 Anemia, unspecified: Secondary | ICD-10-CM | POA: Diagnosis not present

## 2023-08-14 DIAGNOSIS — E039 Hypothyroidism, unspecified: Secondary | ICD-10-CM

## 2023-08-14 LAB — POCT URINE DIPSTICK
Bilirubin, UA: NEGATIVE
Blood, UA: NEGATIVE
Glucose, UA: 100 mg/dL — AB
Nitrite, UA: NEGATIVE
POC PROTEIN,UA: 30 — AB
Spec Grav, UA: 1.015 (ref 1.010–1.025)
Urobilinogen, UA: 0.2 U/dL
pH, UA: 6 (ref 5.0–8.0)

## 2023-08-14 LAB — TSH: TSH: 2.64 u[IU]/mL (ref 0.35–5.50)

## 2023-08-14 LAB — HEMOGLOBIN A1C: Hgb A1c MFr Bld: 6 % (ref 4.6–6.5)

## 2023-08-14 MED ORDER — AMOXICILLIN-POT CLAVULANATE 875-125 MG PO TABS
1.0000 | ORAL_TABLET | Freq: Two times a day (BID) | ORAL | 0 refills | Status: DC
Start: 2023-08-14 — End: 2023-11-01

## 2023-08-14 NOTE — Assessment & Plan Note (Addendum)
poct urine dip in office Urine culture ordered pending results rx augmentin 875/125 mg po bid x 10 days  antbx sent to pharmacy, pt to take as directed. Encouraged increased water intake throughout the day. Choosing to treat due to being symptomatic. If no improvement in the next 2 days pt advised to let me know.

## 2023-08-14 NOTE — Assessment & Plan Note (Signed)
Suspect pnd from left nares, as with erythema and dried blood However advised pt to follow up if notices more cough with productive sputum that is bloody and or start of worsening sob, chest pain, chest tightness.   Ordering cbc as well to monitor anemia.

## 2023-08-14 NOTE — Progress Notes (Signed)
Established Patient Office Visit  Subjective:   Patient ID: Carmen [name], female    DOB: May 11, 1941  Age: 82 y.o. MRN: [id number]  CC:  Chief Complaint  Patient presents with   Acute Visit    Reports "pressure" when she urinates but this has gotten better since Friday. This morning she did cough up a large blood clot.    HPI: Carmen [name] is a 82 y.o. female presenting on 08/14/2023 for Acute Visit (Reports "pressure" when she urinates but this has gotten better since Friday. This morning she did cough up a large blood clot.)  She states last week with urinary pressure and frequency. No flank pain and or chills or fever, has gotten better over the weekend however.   She does have seasonal allergies, she did report blood nose about one month ago but not since. She is on allergy medicine, on Claritin right now which has helped. This am she coughed up some blood. Yesterday she woke up because she was coughing and she was unable to get out of her bed as she had wedged herself into the corner of the bed and she started to 'freak out' and was trying her hardest to life her upper body out but she couldn't. She worked herself out that she was so exhausted, she ended up wetting herself due to not being able to get up and having to pee, and then she woke up soaked from sweating. She did not get out of bed until she knocked on the wall with her daughter and son in law on the other side and they came and helped her up. Had one bout of coughing this am with the blood and has not coughed since.   She is on plavix.        ROS: Negative unless specifically indicated above in HPI.   Relevant past medical history reviewed and updated as indicated.   Allergies and medications reviewed and updated.   Current Outpatient Medications:    amoxicillin-clavulanate (AUGMENTIN) 875-125 MG tablet, Take 1 tablet by mouth 2 (two) times daily., Disp: 20 tablet, Rfl: 0   atorvastatin (LIPITOR) 40 MG tablet, Take  1 tablet (40 mg total) by mouth at bedtime., Disp: 90 tablet, Rfl: 3   CALCIUM PO, Take 1 tablet by mouth daily., Disp: , Rfl:    Cholecalciferol (VITAMIN D-3) 25 MCG (1000 UT) CAPS, Take 1,000 Units by mouth daily., Disp: , Rfl:    clopidogrel (PLAVIX) 75 MG tablet, Take 1 tablet (75 mg total) by mouth daily., Disp: , Rfl:    cyanocobalamin (VITAMIN B12) 500 MCG tablet, Take 500 mcg by mouth daily., Disp: , Rfl:    diphenhydrAMINE (BENADRYL) 25 MG tablet, Take 25 mg by mouth at bedtime as needed for allergies., Disp: , Rfl:    divalproex (DEPAKOTE) 500 MG DR tablet, Take 1 tablet (500 mg total) by mouth at bedtime., Disp: 90 tablet, Rfl: 3   famotidine (PEPCID AC) 10 MG tablet, Take 10 mg by mouth daily as needed for heartburn or indigestion., Disp: , Rfl:    furosemide (LASIX) 20 MG tablet, TAKE 1 TABLET BY MOUTH ONCE DAILY AS NEEDED, Disp: 90 tablet, Rfl: 0   HYDROcodone-acetaminophen (NORCO/VICODIN) 5-325 MG tablet, Take 1 tablet by mouth 2 (two) times daily as needed for severe pain. Must last 30 days., Disp: 60 tablet, Rfl: 0   [START ON 08/19/2023] HYDROcodone-acetaminophen (NORCO/VICODIN) 5-325 MG tablet, Take 1 tablet by mouth 2 (two) times daily as needed for severe pain.  Must last 30 days., Disp: 60 tablet, Rfl: 0   [START ON 09/18/2023] HYDROcodone-acetaminophen (NORCO/VICODIN) 5-325 MG tablet, Take 1 tablet by mouth 2 (two) times daily as needed for severe pain. Must last 30 days., Disp: 60 tablet, Rfl: 0   hydroxychloroquine (PLAQUENIL) 200 MG tablet, Take 200 mg by mouth., Disp: , Rfl:    levothyroxine (SYNTHROID) 137 MCG tablet, Take 1 tablet (137 mcg total) by mouth daily before breakfast., Disp: 90 tablet, Rfl: 3   loratadine (CLARITIN) 10 MG tablet, Take 1 tablet (10 mg total) by mouth daily., Disp: 30 tablet, Rfl: 11   losartan (COZAAR) 25 MG tablet, Take 1/2 tablet once daily, Disp: 45 tablet, Rfl: 3   magnesium oxide (MAG-OX) 400 (240 Mg) MG tablet, Take 400 mg by mouth daily.,  Disp: , Rfl:    metoprolol succinate (TOPROL-XL) 100 MG 24 hr tablet, Take 1/2 (one-half) tablet by mouth once daily, Disp: 45 tablet, Rfl: 0   naloxone (NARCAN) nasal spray 4 mg/0.1 mL, Place 1 spray into the nose as needed for up to 365 doses (for opioid-induced respiratory depresssion). In case of emergency (overdose), spray once into each nostril. If no response within 3 minutes, repeat application and call 911., Disp: 1 each, Rfl: 0   traZODone (DESYREL) 100 MG tablet, TAKE 1 TABLET BY MOUTH AT BEDTIME AS NEEDED FOR SLEEP, Disp: 90 tablet, Rfl: 3  No Known Allergies  Objective:   BP 126/74   Pulse 78   Temp 98.2 F (36.8 C) (Temporal)   Ht 5' (1.524 m)   Wt 166 lb (75.3 kg)   SpO2 97%   BMI 32.42 kg/m    Physical Exam Constitutional:      General: She is not in acute distress.    Appearance: Normal appearance. She is normal weight. She is not ill-appearing, toxic-appearing or diaphoretic.  HENT:     Head: Normocephalic.     Right Ear: Tympanic membrane normal.     Left Ear: Tympanic membrane normal.     Nose: Mucosal edema and rhinorrhea present. Rhinorrhea is bloody.     Comments: Some erythema left nares    Mouth/Throat:     Mouth: Mucous membranes are dry.     Pharynx: No oropharyngeal exudate or posterior oropharyngeal erythema.  Eyes:     Extraocular Movements: Extraocular movements intact.     Pupils: Pupils are equal, round, and reactive to light.  Cardiovascular:     Rate and Rhythm: Normal rate and regular rhythm.     Pulses: Normal pulses.     Heart sounds: Normal heart sounds.  Pulmonary:     Effort: Pulmonary effort is normal.     Breath sounds: Normal breath sounds.  Abdominal:     General: Abdomen is flat.     Tenderness: There is no abdominal tenderness. There is no right CVA tenderness or left CVA tenderness.  Musculoskeletal:     Cervical back: Normal range of motion.  Neurological:     General: No focal deficit present.     Mental Status: She is  alert and oriented to person, place, and time. Mental status is at baseline.     Motor: No weakness.  Psychiatric:        Mood and Affect: Mood normal.        Behavior: Behavior normal.        Thought Content: Thought content normal.        Judgment: Judgment normal.     Assessment & Plan:  Sensation of pressure in bladder area -     POCT URINE DIPSTICK  Acute cystitis without hematuria Assessment & Plan: poct urine dip in office Urine culture ordered pending results rx augmentin 875/125 mg po bid x 10 days  antbx sent to pharmacy, pt to take as directed. Encouraged increased water intake throughout the day. Choosing to treat due to being symptomatic. If no improvement in the next 2 days pt advised to let me know.   Orders: -     Amoxicillin-Pot Clavulanate; Take 1 tablet by mouth 2 (two) times daily.  Dispense: 20 tablet; Refill: 0  Glucosuria -     Urinalysis w microscopic + reflex cultur -     Hemoglobin A1c  Acquired hypothyroidism -     TSH  Anemia, unspecified type -     CBC  Bloody sputum Assessment & Plan: Suspect pnd from left nares, as with erythema and dried blood However advised pt to follow up if notices more cough with productive sputum that is bloody and or start of worsening sob, chest pain, chest tightness.   Ordering cbc as well to monitor anemia.      Follow up plan: Return if symptoms worsen or fail to improve.  Mort Sawyers, FNP

## 2023-08-15 ENCOUNTER — Other Ambulatory Visit: Payer: Self-pay | Admitting: Family

## 2023-08-15 DIAGNOSIS — B3731 Acute candidiasis of vulva and vagina: Secondary | ICD-10-CM

## 2023-08-15 LAB — CBC
HCT: 35.1 % — ABNORMAL LOW (ref 36.0–46.0)
Hemoglobin: 11.5 g/dL — ABNORMAL LOW (ref 12.0–15.0)
MCHC: 32.8 g/dL (ref 30.0–36.0)
MCV: 86.2 fL (ref 78.0–100.0)
Platelets: 190 10*3/uL (ref 150.0–400.0)
RBC: 4.07 Mil/uL (ref 3.87–5.11)
RDW: 17.6 % — ABNORMAL HIGH (ref 11.5–15.5)
WBC: 9.6 10*3/uL (ref 4.0–10.5)

## 2023-08-15 MED ORDER — TERCONAZOLE 0.8 % VA CREA: 1.0000 | TOPICAL_CREAM | Freq: Every day | VAGINAL | 0 refills | Status: DC

## 2023-08-16 DIAGNOSIS — R531 Weakness: Secondary | ICD-10-CM | POA: Diagnosis not present

## 2023-08-16 DIAGNOSIS — D649 Anemia, unspecified: Secondary | ICD-10-CM | POA: Diagnosis not present

## 2023-08-16 DIAGNOSIS — Z5181 Encounter for therapeutic drug level monitoring: Secondary | ICD-10-CM | POA: Diagnosis not present

## 2023-08-16 DIAGNOSIS — F32A Depression, unspecified: Secondary | ICD-10-CM | POA: Diagnosis not present

## 2023-08-16 DIAGNOSIS — I10 Essential (primary) hypertension: Secondary | ICD-10-CM | POA: Diagnosis not present

## 2023-08-16 DIAGNOSIS — Z7902 Long term (current) use of antithrombotics/antiplatelets: Secondary | ICD-10-CM | POA: Diagnosis not present

## 2023-08-16 DIAGNOSIS — W19XXXD Unspecified fall, subsequent encounter: Secondary | ICD-10-CM | POA: Diagnosis not present

## 2023-08-16 DIAGNOSIS — E785 Hyperlipidemia, unspecified: Secondary | ICD-10-CM | POA: Diagnosis not present

## 2023-08-16 DIAGNOSIS — Z556 Problems related to health literacy: Secondary | ICD-10-CM | POA: Diagnosis not present

## 2023-08-16 DIAGNOSIS — H9191 Unspecified hearing loss, right ear: Secondary | ICD-10-CM | POA: Diagnosis not present

## 2023-08-16 DIAGNOSIS — M79604 Pain in right leg: Secondary | ICD-10-CM | POA: Diagnosis not present

## 2023-08-16 DIAGNOSIS — I69398 Other sequelae of cerebral infarction: Secondary | ICD-10-CM | POA: Diagnosis not present

## 2023-08-16 DIAGNOSIS — E039 Hypothyroidism, unspecified: Secondary | ICD-10-CM | POA: Diagnosis not present

## 2023-08-16 DIAGNOSIS — H539 Unspecified visual disturbance: Secondary | ICD-10-CM | POA: Diagnosis not present

## 2023-08-16 LAB — URINALYSIS W MICROSCOPIC + REFLEX CULTURE
Bilirubin Urine: NEGATIVE
Glucose, UA: NEGATIVE
Hgb urine dipstick: NEGATIVE
Hyaline Cast: NONE SEEN /LPF
Ketones, ur: NEGATIVE
Nitrites, Initial: NEGATIVE
Specific Gravity, Urine: 1.025 (ref 1.001–1.035)
WBC, UA: >=60 /[HPF] — AB (ref 0–5)
pH: 7 (ref 5.0–8.0)

## 2023-08-16 LAB — URINE CULTURE
MICRO NUMBER:: 15720815
SPECIMEN QUALITY:: ADEQUATE

## 2023-08-16 LAB — CULTURE INDICATED

## 2023-08-20 ENCOUNTER — Ambulatory Visit (INDEPENDENT_AMBULATORY_CARE_PROVIDER_SITE_OTHER): Payer: Medicare Other

## 2023-08-20 VITALS — Ht 60.0 in | Wt 166.0 lb

## 2023-08-20 DIAGNOSIS — Z0000 Encounter for general adult medical examination without abnormal findings: Secondary | ICD-10-CM | POA: Diagnosis not present

## 2023-08-20 NOTE — Progress Notes (Signed)
Subjective:   Carmen [name] is a 82 y.o. female who presents for Medicare Annual (Subsequent) preventive examination.  Visit Complete: Virtual I connected with  [name] on 08/20/23 by a audio enabled telemedicine application and verified that I am speaking with the correct person using two identifiers.  Patient Location: Home  Provider Location: Office/Clinic  I discussed the limitations of evaluation and management by telemedicine. The patient expressed understanding and agreed to proceed.  Vital Signs: Because this visit was a virtual/telehealth visit, some criteria may be missing or patient reported. Any vitals not documented were not able to be obtained and vitals that have been documented are patient reported.  Cardiac Risk Factors include: advanced age (>73men, >46 women);dyslipidemia;family history of premature cardiovascular disease;hypertension;female gender;obesity (BMI >30kg/m2)     Objective:    Today's Vitals   08/20/23 1343 08/20/23 1344  Weight: 166 lb (75.3 kg)   Height: 5' (1.524 m)   PainSc: 2  2   PainLoc: Generalized    Body mass index is 32.42 kg/m.     08/20/2023    1:47 PM 07/11/2023    8:46 AM 04/11/2023    8:05 AM 01/15/2023    8:12 AM 12/28/2022   10:10 AM 08/22/2022    9:21 AM 08/14/2022    9:19 AM  Advanced Directives  Does Patient Have a Medical Advance Directive? Yes Yes Yes Yes Yes Yes Yes  Type of Estate agent of [place];Living will  Healthcare Power of [place];Living will Healthcare Power of [place];Living will Healthcare Power of [place];Living will  Healthcare Power of [place];Living will  Does patient want to make changes to medical advance directive? No - Patient declined No - Patient declined   No - Patient declined    Copy of Healthcare Power of Attorney in Chart? Yes - validated most recent copy scanned in chart (See row information)   No - copy requested Yes - validated most recent copy scanned in chart  (See row information)  No - copy requested    Current Medications (verified) Outpatient Encounter Medications as of 08/20/2023  Medication Sig   amoxicillin-clavulanate (AUGMENTIN) 875-125 MG tablet Take 1 tablet by mouth 2 (two) times daily.   atorvastatin (LIPITOR) 40 MG tablet Take 1 tablet (40 mg total) by mouth at bedtime.   CALCIUM PO Take 1 tablet by mouth daily.   Cholecalciferol (VITAMIN D-3) 25 MCG (1000 UT) CAPS Take 1,000 Units by mouth daily.   clopidogrel (PLAVIX) 75 MG tablet Take 1 tablet (75 mg total) by mouth daily.   cyanocobalamin (VITAMIN B12) 500 MCG tablet Take 500 mcg by mouth daily.   diphenhydrAMINE (BENADRYL) 25 MG tablet Take 25 mg by mouth at bedtime as needed for allergies.   divalproex (DEPAKOTE) 500 MG DR tablet Take 1 tablet (500 mg total) by mouth at bedtime.   famotidine (PEPCID AC) 10 MG tablet Take 10 mg by mouth daily as needed for heartburn or indigestion.   furosemide (LASIX) 20 MG tablet TAKE 1 TABLET BY MOUTH ONCE DAILY AS NEEDED   HYDROcodone-acetaminophen (NORCO/VICODIN) 5-325 MG tablet Take 1 tablet by mouth 2 (two) times daily as needed for severe pain. Must last 30 days.   HYDROcodone-acetaminophen (NORCO/VICODIN) 5-325 MG tablet Take 1 tablet by mouth 2 (two) times daily as needed for severe pain. Must last 30 days.   [START ON 09/18/2023] HYDROcodone-acetaminophen (NORCO/VICODIN) 5-325 MG tablet Take 1 tablet by mouth 2 (two) times daily as needed for severe pain. Must last 30 days.  hydroxychloroquine (PLAQUENIL) 200 MG tablet Take 200 mg by mouth.   levothyroxine (SYNTHROID) 137 MCG tablet Take 1 tablet (137 mcg total) by mouth daily before breakfast.   loratadine (CLARITIN) 10 MG tablet Take 1 tablet (10 mg total) by mouth daily.   losartan (COZAAR) 25 MG tablet Take 1/2 tablet once daily   magnesium oxide (MAG-OX) 400 (240 Mg) MG tablet Take 400 mg by mouth daily.   metoprolol succinate (TOPROL-XL) 100 MG 24 hr tablet Take 1/2 (one-half)  tablet by mouth once daily   naloxone (NARCAN) nasal spray 4 mg/0.1 mL Place 1 spray into the nose as needed for up to 365 doses (for opioid-induced respiratory depresssion). In case of emergency (overdose), spray once into each nostril. If no response within 3 minutes, repeat application and call 911.   terconazole (TERAZOL 3) 0.8 % vaginal cream Place 1 applicator vaginally at bedtime.   traZODone (DESYREL) 100 MG tablet TAKE 1 TABLET BY MOUTH AT BEDTIME AS NEEDED FOR SLEEP   No facility-administered encounter medications on file as of 08/20/2023.    Allergies (verified) Patient has no known allergies.   History: Past Medical History:  Diagnosis Date   Abnormal CT scan, lumbar spine (05/11/2021) 05/17/2021   (05/11/2021) LUMBAR CT FINDINGS: Alignment: Lumbar levocurvature, apex L4. Mild lateral listhesis L4 on L5 of approximately 3 mm. Vertebrae: Remote appearing superior endplate deformities at T12 with 10% height loss and L1 with up to 20% height loss. Multilevel discogenic and facet degenerative changes. Mild bilateral SI joint arthrosis.  DISC LEVELS: T11-T12: Near complete disc height loss with de   Abnormal MRI, cervical spine (05/12/2021) 05/17/2021   (05/12/2021) CERVICAL MRI FINDINGS: Motion artifact is present. Posterior Fossa, vertebral arteries, paraspinal tissues: Left superior cerebellar infarct.   DISC LEVELS: C2-C3: Disc bulge with endplate osteophytes. Uncovertebral and facet hypertrophy. C3-C4: Disc bulge with endplate osteophytes. Uncovertebral and facet hypertrophy. Mild canal stenosis. Marked foraminal stenosis. C4-C5: Disc bulge w   Allergy    Anemia    Anxiety    Arthritis    Back pain    Coronary artery disease    Depression    History of stroke 10/02/2020   Hypertension    Peripheral vascular disease (HCC)    Personal history of nicotine dependence 10/02/2020   Prsnl hx of TIA (TIA), and cereb infrc w/o resid deficits 10/02/2020   Stroke [facility])    Thyroid disease     Past Surgical History:  Procedure Laterality Date   back injections     CARDIAC CATHETERIZATION     CATARACT EXTRACTION     CORONARY ANGIOPLASTY     CORONARY ARTERY BYPASS GRAFT     2005   LEFT ATRIAL APPENDAGE OCCLUSION N/A 12/28/2022   Procedure: LEFT ATRIAL APPENDAGE OCCLUSION;  Surgeon: Lanier Prude, MD;  Location: [facility] INVASIVE CV LAB;  Service: Cardiovascular;  Laterality: N/A;   TEE WITHOUT CARDIOVERSION N/A 12/28/2022   Procedure: TRANSESOPHAGEAL ECHOCARDIOGRAM;  Surgeon: Lanier Prude, MD;  Location: [facility] INVASIVE CV LAB;  Service: Cardiovascular;  Laterality: N/A;   Family History  Problem Relation Age of Onset   Breast cancer Mother    Heart attack Father    Bipolar disorder Daughter    Diabetes Mellitus II Neg Hx    Social History   Socioeconomic History   Marital status: Widowed    Spouse name: Not on file   Number of children: 3   Years of education: Not on file   Highest education level: Not  on file  Occupational History   Not on file  Tobacco Use   Smoking status: Former    Current packs/day: 0.00    Types: Cigarettes    Start date: 05/1990    Quit date: 05/2020    Years since quitting: 3.3   Smokeless tobacco: Never   Tobacco comments:    Smoked about 1 pack per month  Vaping Use   Vaping status: Never Used  Substance and Sexual Activity   Alcohol use: Not Currently   Drug use: Yes    Types: Hydrocodone   Sexual activity: Not Currently  Other Topics Concern   Not on file  Social History Narrative   ** Merged History Encounter **       Social Determinants of Health   Financial Resource Strain: Low Risk  (08/20/2023)   Overall Financial Resource Strain (CARDIA)    Difficulty of Paying Living Expenses: Not hard at all  Food Insecurity: No Food Insecurity (08/20/2023)   Hunger Vital Sign    Worried About Running Out of Food in the Last Year: Never true    Ran Out of Food in the Last Year: Never true  Transportation Needs: No  Transportation Needs (08/20/2023)   PRAPARE - Administrator, Civil Service (Medical): No    Lack of Transportation (Non-Medical): No  Physical Activity: Insufficiently Active (08/20/2023)   Exercise Vital Sign    Days of Exercise per Week: 7 days    Minutes of Exercise per Session: 20 min  Stress: No Stress Concern Present (08/20/2023)   [employer] of Occupational Health - Occupational Stress Questionnaire    Feeling of Stress : Not at all  Social Connections: Unknown (08/20/2023)   Social Connection and Isolation Panel [NHANES]    Frequency of Communication with Friends and Family: More than three times a week    Frequency of Social Gatherings with Friends and Family: More than three times a week    Attends Religious Services: Not on file    Active Member of Clubs or Organizations: Yes    Attends Banker Meetings: More than 4 times per year    Marital Status: Widowed    Tobacco Counseling Counseling given: Not Answered Tobacco comments: Smoked about 1 pack per month   Clinical Intake:  Pre-visit preparation completed: Yes  Pain : 0-10 Pain Score: 2  Pain Type: Chronic pain Pain Location: Generalized     BMI - recorded: 32.42 Nutritional Status: BMI > 30  Obese Nutritional Risks: None Diabetes: No  How often do you need to have someone help you when you read instructions, pamphlets, or other written materials from your doctor or pharmacy?: 1 - Never What is the last grade level you completed in school?: Dental Assistant; Dietian  Interpreter Needed?: No  Information entered by :: Susie Cassette, LPN.   Activities of Daily Living    08/20/2023    1:57 PM 12/28/2022   10:12 AM  In your present state of health, do you have any difficulty performing the following activities:  Hearing? 1 1  Vision? 0 0  Difficulty concentrating or making decisions? 1 1  Walking or climbing stairs? 1 1  Dressing or bathing? 0 0  Doing errands,  shopping? 1   Preparing Food and eating ? N   Using the Toilet? N   In the past six months, have you accidently leaked urine? Y   Comment wears protection   Do you have problems with loss of bowel  control? Y   Managing your Medications? Y   Managing your Finances? Y   Housekeeping or managing your Housekeeping? Y     Patient Care Team: Mort Sawyers, FNP as PCP - General (Family Medicine) End, Cristal Deer, MD as PCP - Cardiology (Cardiology) Lanier Prude, MD as PCP - Electrophysiology (Cardiology) End, Cristal Deer, MD as Consulting Physician (Cardiology) Domingo Madeira, OD as Consulting Physician (Optometry)  Indicate any recent Medical Services you may have received from other than [facility] providers in the past year (date may be approximate).     Assessment:   This is a routine wellness examination for [place].  Hearing/Vision screen Hearing Screening - Comments:: Patient has hearing difficulty and wears hearing aids. Vision Screening - Comments:: Patient does wear eaders.  Bilateral cataracts done. Annual eye exam done by: Edger [facility], OD.    Goals Addressed             This Visit's Progress    Client understands the importance of follow-up with providers by attending scheduled visits        Depression Screen    08/20/2023    1:51 PM 07/11/2023    8:47 AM 06/14/2023   11:33 AM 04/11/2023    8:04 AM 04/02/2023   10:33 AM 02/21/2023   11:05 AM 01/15/2023    8:12 AM  PHQ 2/9 Scores  PHQ - 2 Score 1 6 6  0 0 0 0  PHQ- 9 Score 5 21 26         Fall Risk    08/20/2023    1:48 PM 07/11/2023    8:46 AM 06/14/2023   11:33 AM 04/11/2023    8:04 AM 04/02/2023   10:33 AM  Fall Risk   Falls in the past year? 1 1 1 1 1   Number falls in past yr: 1 0 0 0 1  Injury with Fall? 1 1 1  0 1  Comment  "hurt right elbow"     Risk for fall due to : History of fall(s);Impaired balance/gait;Impaired mobility;Orthopedic patient  History of fall(s)  History of fall(s);Impaired balance/gait   Follow up Education provided;Falls prevention discussed;Falls evaluation completed  Falls evaluation completed  Falls evaluation completed    MEDICARE RISK AT HOME: Medicare Risk at Home Any stairs in or around the home?: No If so, are there any without handrails?: No Home free of loose throw rugs in walkways, pet beds, electrical cords, etc?: Yes Adequate lighting in your home to reduce risk of falls?: Yes Life alert?: Yes Use of a cane, walker or w/c?: Yes Grab bars in the bathroom?: Yes Shower chair or bench in shower?: Yes Elevated toilet seat or a handicapped toilet?: Yes  TIMED UP AND GO:  Was the test performed?  No    Cognitive Function:    08/20/2023    1:59 PM 03/29/2023    1:36 PM  MMSE - Mini Mental State Exam  Not completed: Unable to complete   Orientation to time  3  Orientation to Place  5  Registration  3  Attention/ Calculation  5  Recall  3  Language- name 2 objects  2  Language- repeat  0  Language- follow 3 step command  3  Language- read & follow direction  1  Write a sentence  1  Copy design  0  Total score  26        Immunizations Immunization History  Administered Date(s) Administered   Fluad Quad(high Dose 65+) 06/22/2022   Fluad Trivalent(High  Dose 65+) 06/14/2023    TDAP status: Due, Education has been provided regarding the importance of this vaccine. Advised may receive this vaccine at local pharmacy or Health Dept. Aware to provide a copy of the vaccination record if obtained from local pharmacy or Health Dept. Verbalized acceptance and understanding.  Flu Vaccine status: Up to date  Pneumococcal vaccine status: Due, Education has been provided regarding the importance of this vaccine. Advised may receive this vaccine at local pharmacy or Health Dept. Aware to provide a copy of the vaccination record if obtained from local pharmacy or Health Dept. Verbalized acceptance and understanding.  Covid-19 vaccine status: Declined,  Education has been provided regarding the importance of this vaccine but patient still declined. Advised may receive this vaccine at local pharmacy or Health Dept.or vaccine clinic. Aware to provide a copy of the vaccination record if obtained from local pharmacy or Health Dept. Verbalized acceptance and understanding.  Qualifies for Shingles Vaccine? Yes   Zostavax completed No   Shingrix Completed?: No.    Education has been provided regarding the importance of this vaccine. Patient has been advised to call insurance company to determine out of pocket expense if they have not yet received this vaccine. Advised may also receive vaccine at local pharmacy or Health Dept. Verbalized acceptance and understanding.  Screening Tests:  No record in [facility] Health Maintenance  Topic Date Due   Zoster Vaccines- Shingrix (1 of 2) Never done   Pneumonia Vaccine 68+ Years old (1 of 1 - PCV) 10/21/2023 (Originally 12/30/2006)   DEXA SCAN  10/21/2023 (Originally 12/30/2006)   COVID-19 Vaccine (1) 12/08/2023 (Originally 12/30/1945)   Medicare Annual Wellness (AWV)  08/19/2024   INFLUENZA VACCINE  Completed   HPV VACCINES  Aged Out   DTaP/Tdap/Td  Discontinued    Health Maintenance  Health Maintenance Due  Topic Date Due   Zoster Vaccines- Shingrix (1 of 2) Never done    Colorectal cancer screening: No longer required.   Mammogram status: No longer required due to age.  Bone density status: Postponed until 10/21/2023  Lung Cancer Screening: (Low Dose CT Chest recommended if Age 20-80 years, 20 pack-year currently smoking OR have quit w/in 15years.) does not qualify.   Lung Cancer Screening Referral: no  Additional Screening:  Hepatitis C Screening: does not qualify; Completed: no  Vision Screening: Recommended annual ophthalmology exams for early detection of glaucoma and other disorders of the eye. Is the patient up to date with their annual eye exam?  Yes  Who is the provider or what is the name  of the office in which the patient attends annual eye exams? [facility], OD. If pt is not established with a provider, would they like to be referred to a provider to establish care? No .   Dental Screening: Recommended annual dental exams for proper oral hygiene  Diabetic Foot Exam: N/A  Community Resource Referral / Chronic Care Management: CRR required this visit?  No   CCM required this visit?  No     Plan:     I have personally reviewed and noted the following in the patient's chart:   Medical and social history Use of alcohol, tobacco or illicit drugs  Current medications and supplements including opioid prescriptions. Patient is currently taking opioid prescriptions. Information provided to patient regarding non-opioid alternatives. Patient advised to discuss non-opioid treatment plan with their provider. Functional ability and status Nutritional status Physical activity Advanced directives List of other physicians Hospitalizations, surgeries, and ER visits  in previous 12 months Vitals Screenings to include cognitive, depression, and falls Referrals and appointments  In addition, I have reviewed and discussed with patient certain preventive protocols, quality metrics, and best practice recommendations. A written personalized care plan for preventive services as well as general preventive health recommendations were provided to patient.     Mickeal Needy, LPN   52/84/1324   After Visit Summary: (MyChart) Due to this being a telephonic visit, the after visit summary with patients personalized plan was offered to patient via MyChart   Nurse Notes: Today's visit was completed by daughter, Antoniette.

## 2023-08-20 NOTE — Patient Instructions (Signed)
Carmen [name] , Thank you for taking time to come for your Medicare Wellness Visit. I appreciate your ongoing commitment to your health goals. Please review the following plan we discussed and let me know if I can assist you in the future.   Referrals/Orders/Follow-Ups/Clinician Recommendations: No  This is a list of the screening recommended for you and due dates:  Health Maintenance  Topic Date Due   Zoster (Shingles) Vaccine (1 of 2) Never done   Pneumonia Vaccine (1 of 1 - PCV) 10/21/2023*   DEXA scan (bone density measurement)  10/21/2023*   COVID-19 Vaccine (1) 12/08/2023*   Medicare Annual Wellness Visit  08/19/2024   Flu Shot  Completed   HPV Vaccine  Aged Out   DTaP/Tdap/Td vaccine  Discontinued  *Topic was postponed. The date shown is not the original due date.    Advanced directives: (In Chart) A copy of your advanced directives are scanned into your chart should your provider ever need it.  Next Medicare Annual Wellness Visit scheduled for next year: No

## 2023-08-24 ENCOUNTER — Other Ambulatory Visit: Payer: Self-pay | Admitting: Internal Medicine

## 2023-08-24 NOTE — Telephone Encounter (Signed)
last visit: 04/07/23 with plan to f/u in 6 months.  next visit:  09/27/23

## 2023-08-28 ENCOUNTER — Ambulatory Visit: Payer: Medicare Other | Admitting: Psychology

## 2023-09-03 DIAGNOSIS — E039 Hypothyroidism, unspecified: Secondary | ICD-10-CM | POA: Diagnosis not present

## 2023-09-03 DIAGNOSIS — I10 Essential (primary) hypertension: Secondary | ICD-10-CM | POA: Diagnosis not present

## 2023-09-03 DIAGNOSIS — I2510 Atherosclerotic heart disease of native coronary artery without angina pectoris: Secondary | ICD-10-CM | POA: Diagnosis not present

## 2023-09-23 ENCOUNTER — Other Ambulatory Visit: Payer: Self-pay | Admitting: Internal Medicine

## 2023-09-27 ENCOUNTER — Encounter: Payer: Self-pay | Admitting: Physician Assistant

## 2023-09-27 ENCOUNTER — Ambulatory Visit: Payer: Medicare Other | Attending: Physician Assistant | Admitting: Emergency Medicine

## 2023-09-27 VITALS — BP 126/84 | HR 77 | Ht 60.0 in | Wt 169.0 lb

## 2023-09-27 DIAGNOSIS — I4821 Permanent atrial fibrillation: Secondary | ICD-10-CM | POA: Diagnosis not present

## 2023-09-27 DIAGNOSIS — Z95818 Presence of other cardiac implants and grafts: Secondary | ICD-10-CM | POA: Diagnosis not present

## 2023-09-27 DIAGNOSIS — I10 Essential (primary) hypertension: Secondary | ICD-10-CM

## 2023-09-27 DIAGNOSIS — E785 Hyperlipidemia, unspecified: Secondary | ICD-10-CM

## 2023-09-27 DIAGNOSIS — I25810 Atherosclerosis of coronary artery bypass graft(s) without angina pectoris: Secondary | ICD-10-CM

## 2023-09-27 DIAGNOSIS — M7989 Other specified soft tissue disorders: Secondary | ICD-10-CM

## 2023-09-27 MED ORDER — METOPROLOL SUCCINATE ER 100 MG PO TB24: 50.0000 mg | ORAL_TABLET | Freq: Every day | ORAL | 3 refills | Status: DC

## 2023-09-27 MED ORDER — FUROSEMIDE 20 MG PO TABS: 20.0000 mg | ORAL_TABLET | Freq: Every day | ORAL | 3 refills | Status: DC | PRN

## 2023-09-27 NOTE — Progress Notes (Signed)
Cardiology Office Note:    Date:  09/27/2023  ID:  Carmen [name], DOB 08-14-1941, MRN [id number] PCP: Mort Sawyers, FNP  [facility] Providers Cardiologist:  Yvonne Kendall, MD Electrophysiologist:  Lanier Prude, MD        Patient Profile:      Carmen [name] is a 82 year old female with visit pertinent history of CAD s/p CABG in 2005 with LIMA to LAD, SVG to diagonal and OM, SVG to PDA with subsequent PCI due to failure of multiple grafts (stents to proximal LAD, mid LCx, RCA), CVA, PAD, HTN, HLD, hypothyroidism, anemia, persistent atrial fibrillation s/p LAAO with Watchman.  She previously underwent 4-vessel CABG in 2005 while living in [place], with LIMA to LAD, sequential SVG to diagonal and OM, and SVG to PDA. A year later, there was failure of multiple grafts and she underwent PCI/DES (Taxus stents) to the proximal LAD, mid LCx, and RCA x 2 in 2006.  Prior to moving to [facility] from [place], it was noted she had been weak and experiencing falls for at least a year. She was hospitalized in 10/2020, due to encephalopathy, falls, and failure to thrive. Imaging showed evidence of prior infarcts. She was subsequently seen by her PCP in the office in 11/2020, and was noted to be in Afib. She was advised to go to the ED for further evaluation in the setting of innumerable falls and increasing dizziness in the setting of Afib. The patient declined.    At her visit to establish care with our office on 11/24/2020, she noted some exertional dyspnea and chest pain that she attributed to her prior falls that had improved. Echo March 2022 with normal LVEF.  She was admitted in August 2023 with AMS/unresponsiveness that was concerning for a TIA.  She was also noted to have a fall where she is struck and hit her head.  MRI of the brain was without acute abnormalities.  She presented to the ED a month later after sustaining a fall while using her bedside commode where she again hit her head  causing a periorbital hematoma.    She saw Dr. Okey Dupre in December 2023 where she had noted ongoing falls.  She was then referred to Dr. Lalla Brothers for LAAO closure to avoid long-term anticoagulation given her frequent falls.  She is now s/p LAAO closure with Watchman FLX 31 mm device in March 2024.  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      History of Present Illness:  Carmen [name] is a 82 y.o. female who returns for 17-month follow-up for CAD, hypertension, and HLD.  She comes in today accompanied by her daughter and has been doing well overall.  She notes that she is moved in with her daughter about a year ago and over the last 2 weeks she now has her own apartment at her daughter's house.  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  She notes that she does tend to watch TV much throughout the day.  Most of her exercise is walking to the mailbox.  She does go to PT for her right leg pain.  She notes her  lower leg swelling has been stable she takes Lasix every Monday Wednesday Friday.  Over the past year she has only had to take an extra dose of Lasix once.  She does not wear compression stockings.  Her diet is well-controlled given her daughter cooks most of her meals and adheres to a low-salt regimen.  She does note chronic DOE that herself and daughter relate to general deconditioning.  Her DOE however has improved over the past year.  Patient is interested in water aerobics for seniors 2025 to further improve her general deconditioning.  She does note that she has been well since her watchman's procedure and has not had any falls over the past  year.  She denies chest pain, lower extremity edema, fatigue, palpitations, melena, hematuria, hemoptysis, diaphoresis, weakness, presyncope, syncope, orthopnea, and PND.          Review of Systems  Constitutional: Negative for weight gain and weight loss.  Cardiovascular:  Positive for dyspnea on exertion. Negative for chest pain, claudication, irregular heartbeat, leg swelling, near-syncope, orthopnea, palpitations, paroxysmal nocturnal dyspnea and syncope.  Respiratory:  Negative for cough, hemoptysis and shortness of breath.   Gastrointestinal:  Negative for abdominal pain, hematochezia and melena.  Genitourinary:  Negative for hematuria.     See HPI    Studies Reviewed:   EKG Interpretation Date/Time:  Thursday September 27 2023 08:01:09 EST Ventricular Rate:  77 PR Interval:    QRS Duration:  76 QT Interval:  394 QTC Calculation: 445 R Axis:   68  Text Interpretation: Atrial fibrillation Confirmed by Rise Paganini [id number]) on 09/27/2023 10:59:10 AM    Cardiac CTA 02/23/2023 1. A 31 mm Watchman FLX is present in the appendage with a small ~2.5 mm peri-device leak.   2. Severe biatrial enlargement.   3. 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   4. Small PFO.  Echocardiogram 12/20/2022  1. Left ventricular ejection fraction, by estimation, is 55 to 60%. The  left ventricle has normal function. The left ventricle has no regional  wall motion abnormalities. There is mild left ventricular hypertrophy.  Left ventricular diastolic parameters  are indeterminate.   2. Right ventricular systolic function is normal. The right ventricular  size is mildly enlarged.   3. Left atrial size was severely dilated.   4. The mitral valve is normal in structure. Mild mitral valve  regurgitation.   5. The aortic valve was not well visualized. Aortic valve regurgitation  is not visualized.   6. The inferior vena cava is normal in size with greater than 50%   respiratory variability, suggesting right atrial pressure of 3 mmHg.   Zio 10/27/2022 HR 53 - 161 bpm, average 89 bpm. Rare ventricular ectopy. 100% AF burden during the monitoring period. Risk Assessment/Calculations:    CHA2DS2-VASc Score = 7   This indicates a 11.2% annual risk of stroke. The patient's score is based upon: CHF History: 0 HTN History: 1 Diabetes History: 0 Stroke History: 2 Vascular Disease History: 1 Age Score: 2 Gender Score: 1            Physical Exam:   VS:  BP 126/84 (BP Location: Left Arm, Patient Position: Sitting, Cuff Size: Normal)   Pulse 77   Ht 5' (1.524 m)   Wt 169 lb (76.7 kg)   SpO2 98%   BMI 33.01 kg/m    Wt Readings from Last 3 Encounters:  09/27/23 169 lb (76.7 kg)  08/20/23 166 lb (75.3 kg)  08/14/23 166 lb (  75.3 kg)    Constitutional:      Appearance: Normal and healthy appearance.  HENT:     Head: Normocephalic.  Neck:     Vascular: JVD normal.  Pulmonary:     Effort: Pulmonary effort is normal.     Breath sounds: Normal breath sounds.  Chest:     Chest wall: Not tender to palpatation.  Cardiovascular:     PMI at left midclavicular line. Normal rate. Irregularly irregular rhythm. Normal S1. Normal S2.      Murmurs: There is no murmur.     No gallop.  No click. No rub.  Pulses:    Intact distal pulses.  Edema:    Peripheral edema absent.  Musculoskeletal: Normal range of motion.     Cervical back: Normal range of motion and neck supple. Skin:    General: Skin is warm and dry.  Neurological:     General: No focal deficit present.     Mental Status: Alert, oriented to person, place, and time and oriented to person, place and time.  Psychiatric:        Attention and Perception: Attention and perception normal.        Mood and Affect: Mood normal.        Behavior: Behavior is cooperative.        Thought Content: Thought content normal.       Assessment and Plan:  Permanent atrial fibrillation s/p Watchman -EKG  today shows rate controlled atrial fibrillation.  She is not on anticoagulation due to frequent falls but is s/p left atrial appendage occlusion in March 2024.  CTA 01/2023 showing watchman present in appendage w/ small 2.63mm device leak with severe biatrial enlargement.  Continue antiplatelet therapy with Plavix 75 mg.  Continue rate control therapy with Toprol XL 50 mg. -CHA2DS2-VASc Score = 7 [CHF History: 0, HTN History: 1, Diabetes History: 0, Stroke History: 2, Vascular Disease History: 1, Age Score: 2, Gender Score: 1].  Therefore, the patient's annual risk of stroke is 11.2 %.      Coronary artery disease s/p CABG in 2005 -EKG today without ST/T wave changes. No CP and slightly improved chronic DOE. DOE likely in setting of deconditioning given severe right leg pain due to arthritis, she does not follow with pulmonology on an as-needed basis.  She is interested in water aerobics to improve her overall conditioning in 2025.  She is stable with no anginal symptoms, no indication for ischemic evaluation at this time.  Continue Plavix 75 mg and Lipitor 40 mg.  Hyperlipidemia -LDL 22 on 06/14/2023.  Her lipids are under excellent control.  Her diet has been much improved since moving in with her daughter 1 year ago.  Continue her current heart healthy diet regimen.  Would like to see her begin to become more active in the coming year.  Continue Lipitor 40 mg daily.  Hypertension -BP today 126/84.  Under good control.  BP tends to be elevated until she takes her Vicodin for pain.  Continue monitoring at home.  Continue losartan 25 mg and Toprol-XL 50 mg once daily.  Leg swelling -Has been well-controlled over the past year.  Sedentary lifestyle likely contributes.  She continues to be compliant with low-salt diet.  Encouraged use of lower extremity stockings and to elevate legs.  Continue Lasix 20 mg every M, W, F and as needed.  Kidney function within normal limits 01/2023.              Dispo:  Return in about 6 months (around 03/27/2024).  Signed, Denyce Robert, NP

## 2023-09-27 NOTE — Patient Instructions (Signed)
Medication Instructions:  Your Physician recommend you continue on your current medication as directed.    *If you need a refill on your cardiac medications before your next appointment, please call your pharmacy*   Lab Work: None ordered at this time    Follow-Up: At [facility], you and your health needs are our priority.  As part of our continuing mission to provide you with exceptional heart care, we have created designated Provider Care Teams.  These Care Teams include your primary Cardiologist (physician) and Advanced Practice Providers (APPs -  Physician Assistants and Nurse Practitioners) who all work together to provide you with the care you need, when you need it.  We recommend signing up for the patient portal called "MyChart".  Sign up information is provided on this After Visit Summary.  MyChart is used to connect with patients for Virtual Visits (Telemedicine).  Patients are able to view lab/test results, encounter notes, upcoming appointments, etc.  Non-urgent messages can be sent to your provider as well.   To learn more about what you can do with MyChart, go to [email or web address].    Your next appointment:   6 month(s)  Provider:   You may see Yvonne Kendall, MD or one of the following Advanced Practice Providers on your designated Care Team:   Nicolasa Ducking, NP Eula Listen, PA-C Cadence Fransico Michael, PA-C Charlsie Quest, NP Carlos Levering, NP

## 2023-10-16 NOTE — Progress Notes (Signed)
 PROVIDER NOTE: Information contained herein reflects review and annotations entered in association with encounter. Interpretation of such information and data should be left to medically-trained personnel. Information provided to patient can be located elsewhere in the medical record under "Patient Instructions". Document created using STT-dictation technology, any transcriptional errors that may result from process are unintentional.    Patient: Carmen [name]  Service Category: E/M  Provider: Candi Chafe, MD  DOB: January 17, 1941  DOS: 10/17/2023  Referring Provider: Felicita Horns, FNP  MRN: [id number]  Specialty: Interventional Pain Management  PCP: Felicita Horns, FNP  Type: Established Patient  Setting: Ambulatory outpatient    Location: Office  Delivery: Face-to-face     HPI  Carmen [name], a 83 y.o. year old female, is here today because of her Chronic pain syndrome [G89.4]. Carmen [name] primary complain today is Leg Pain (Right )  Pertinent problems: Carmen [name] has Peripheral vascular disease (HCC); Chronic low back pain (1ry area of Pain) (Bilateral) (R>L) w/o sciatica; Dorsalgia, unspecified; Chronic pain syndrome; Chronic neck pain (2ry area of Pain) (Bilateral) (L>R); Chronic lower extremity pain (3ry area of Pain) (Bilateral) (R>L); Chronic hand pain (Left); Chronic hand pain (Right); Lumbar compression fracture (Old) (Multilevel), sequela; Chronic cervical radiculopathy (Bilateral); DDD (degenerative disc disease), cervical; Foraminal stenosis of cervical region; Lumbar facet syndrome (Bilateral); Lumbosacral facet hypertrophy (Multilevel) (Bilateral); Lumbar facet arthropathy (Multilevel) (Bilateral); 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HCC); Cervical facet syndrome (Bilateral); Cervical facet hypertrophy (Multilevel) (Bilateral); Spondylosis without myelopathy or radiculopathy, cervical region; Spondylosis without myelopathy or radiculopathy, lumbosacral region; Other  intervertebral disc degeneration, lumbar region; DDD (degenerative disc disease), lumbosacral; Levoscoliosis of lumbar spine; Lumbosacral foraminal stenosis (Multilevel) (Bilateral); Degenerative lateral spondylolisthesis of L4/L5 (3mm); Baastrup's syndrome; Unsteady gait when walking; Chronic hip pain (Right); Greater trochanteric bursitis (Right); Chronic lower extremity pain (Right); Osteoarthritis of hip (Right); Right sided weakness; Pedal edema; and Calcium  pyrophosphate arthropathy on their pertinent problem list. Pain Assessment: Severity of Chronic pain is reported as a 7 /10. Location: Back Left, Right/radiates from lower right back down to right hip down entire right leg. Onset: More than a month ago. Quality: Constant, Aching. Timing: Constant. Modifying factor(s): Pain medication and heat. Vitals:  height is 5' (1.524 m) and weight is 175 lb (79.4 kg). Her temporal temperature is 96.6 F (35.9 C) (abnormal). Her blood pressure is 155/79 (abnormal) and her pulse is 78. Her respiration is 18 and oxygen saturation is 100%.  BMI: Estimated body mass index is 34.18 kg/m as calculated from the following:   Height as of this encounter: 5' (1.524 m).   Weight as of this encounter: 175 lb (79.4 kg). Last encounter: 07/11/2023. Last procedure: Visit date not found.  Reason for encounter: medication management.  The patient indicates doing well with the current medication regimen. No adverse reactions or side effects reported to the medications.   Discussed the use of AI scribe software for clinical note transcription with the patient, who gave verbal consent to proceed.  History of Present Illness   The patient, with a history of chronic pain, reports that her current pain medication is not providing adequate relief. The pain is primarily located in the right buttock and radiates down the right leg, but does not extend to the foot. The patient denies any new symptoms or adverse reactions to the  medication. She also has a history of arthritis, which she reports can exacerbate her pain depending on the weather.  In addition to her pain medication, the patient has been  taking Plavix  as a blood thinner following a surgical procedure. She reports no issues with this medication. The patient also has a prescription for naloxone  or Narcan , which she keeps on hand as a precautionary measure.  The patient has been compliant with her medication regimen, as evidenced by her recent urine drug screen test and prescription monitoring program. She has not added any new medications to her regimen. The patient has expressed a willingness to consider a "drug holiday" to manage her tolerance to her pain medication.     RTCB: 01/16/2024   Pharmacotherapy Assessment  Analgesic: Hydrocodone /APAP 5/325, 1 tab p.o. BID (10 mg/day of hydrocodone ) (10 MME) (last filled on 05/02/2021) MME/day: 10 mg/day   Monitoring: [place] PMP: PDMP reviewed during this encounter.       Pharmacotherapy: No side-effects or adverse reactions reported. Compliance: No problems identified. Effectiveness: Clinically acceptable.  Huston Maiers, RN  10/17/2023  8:07 AM  Sign when Signing Visit Nursing Pain Medication Assessment:  Safety precautions to be maintained throughout the outpatient stay will include: orient to surroundings, keep bed in low position, maintain call bell within reach at all times, provide assistance with transfer out of bed and ambulation.   Medication Inspection Compliance: Pill count conducted under aseptic conditions, in front of the patient. Neither the pills nor the bottle was removed from the patient's sight at any time. Once count was completed pills were immediately returned to the patient in their original bottle.  Medication: Hydrocodone /APAP  Pill/Patch Count:  32 of 60 pills remain Pill/Patch Appearance: Markings consistent with prescribed medication Bottle Appearance: Standard pharmacy container.  Clearly labeled. Filled Date: 01 / 02 / 2025 Last Medication intake:  Today    No results found for: "CBDTHCR" No results found for: "D8THCCBX" No results found for: "D9THCCBX"  UDS:  Summary  Date Value Ref Range Status  04/11/2023 Note  Final    Comment:    ==================================================================== [employer] (MW) ==================================================================== Test                             Result       Flag       Units  Drug Present and Declared for Prescription Verification   Hydrocodone                     109          EXPECTED   ng/mg creat   Dihydrocodeine                 74           EXPECTED   ng/mg creat   Norhydrocodone                 432          EXPECTED   ng/mg creat    Sources of hydrocodone  include scheduled prescription medications.    Dihydrocodeine and norhydrocodone are expected metabolites of    hydrocodone . Dihydrocodeine is also available as a scheduled    prescription medication.  ==================================================================== Test                      Result    Flag   Units      Ref Range   Creatinine              78               mg/dL      >=  20 ==================================================================== Declared Medications:  The flagging and interpretation on this report are based on the  following declared medications.  Unexpected results may arise from  inaccuracies in the declared medications.   **Note: The testing scope of this panel includes these medications:   Hydrocodone  (Norco)   **Note: The testing scope of this panel does not include the  following reported medications:   Acetaminophen  (Norco)  Atorvastatin  (Lipitor)  Calcium   Clopidogrel  (Plavix )  Diphenhydramine (Benadryl)  Divaleproex (Depakote )  Famotidine (Pepcid)  Furosemide  (Lasix )  Hydroxychloroquine  (Plaquenil )  Levothyroxine  (Synthroid )  Loratadine  (Claritin )  Losartan   (Cozaar )  Magnesium  (Mag-Ox)  Metoprolol  (Toprol )  Naloxone  (Narcan )  Trazodone  (Desyrel )  Vitamin B12  Vitamin D3 ==================================================================== For clinical consultation, please call [phone]. ====================================================================       ROS  Constitutional: Denies any fever or chills Gastrointestinal: No reported hemesis, hematochezia, vomiting, or acute GI distress Musculoskeletal: Denies any acute onset joint swelling, redness, loss of ROM, or weakness Neurological: No reported episodes of acute onset apraxia, aphasia, dysarthria, agnosia, amnesia, paralysis, loss of coordination, or loss of consciousness  Medication Review  Calcium , HYDROcodone -acetaminophen , Vitamin D -3, amoxicillin -clavulanate, atorvastatin , clopidogrel , cyanocobalamin , diphenhydrAMINE, divalproex , famotidine, furosemide , hydroxychloroquine , levothyroxine , loratadine , losartan , magnesium  oxide, metoprolol  succinate, naloxone , and traZODone   History Review  Allergy: Carmen [name] has no known allergies. Drug: Carmen [name]  reports current drug use. Drug: Hydrocodone . Alcohol:  reports that she does not currently use alcohol. Tobacco:  reports that she quit smoking about 3 years ago. Her smoking use included cigarettes. She started smoking about 33 years ago. She has never used smokeless tobacco. Social: Carmen [name]  reports that she quit smoking about 3 years ago. Her smoking use included cigarettes. She started smoking about 33 years ago. She has never used smokeless tobacco. She reports that she does not currently use alcohol. She reports current drug use. Drug: Hydrocodone . 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 Surgical: Carmen [name]  has a past surgical history that includes Cardiac catheterization; Coronary angioplasty; Coronary artery bypass graft; back injections; Cataract extraction; LEFT ATRIAL APPENDAGE OCCLUSION (N/A, 12/28/2022); and TEE without cardioversion (N/A, 12/28/2022). Family: family history includes Bipolar disorder in her daughter; Breast cancer in her mother; Heart attack in her father.  Laboratory Chemistry Profile   Renal Lab Results  Component Value Date   BUN 14 02/02/2023   CREATININE 0.85 02/02/2023   BCR 16 02/02/2023   GFR 67.85 10/18/2021   GFRAA 73 11/22/2020   GFRNONAA >60 10/14/2022    Hepatic Lab Results  Component Value Date   AST 30 10/14/2022   ALT 26 10/14/2022   ALBUMIN 3.8 10/14/2022   ALKPHOS 39 10/14/2022   AMMONIA 19 10/06/2020    Electrolytes Lab Results  Component Value Date   NA 139 02/02/2023   K 4.5 02/02/2023   CL 101 02/02/2023   CALCIUM  9.1 02/02/2023   MG 1.6 06/22/2022    Bone Lab Results  Component Value Date   25OHVITD1 12 (L) 03/14/2021   25OHVITD2 <1.0 03/14/2021   25OHVITD3 12 03/14/2021    Inflammation (CRP: Acute Phase) (ESR: Chronic Phase) Lab Results  Component Value Date   CRP 0.8 03/14/2021  ESRSEDRATE 3 08/01/2022         Note: Above Lab results reviewed.  Recent Imaging Review  CT CARDIAC MORPH/PULM VEIN W/CM&W/O CA SCORE Addendum: ADDENDUM REPORT: 03/03/2023 02:03   EXAM:  OVER-READ INTERPRETATION  CT CHEST   The following report is an over-read performed by radiologist Dr.  Mel Spine [facility] on 03/03/2023. This  over-read does not include interpretation of cardiac or coronary  anatomy or pathology. The coronary/cardiac CTA interpretation by the  cardiologist is  attached.   COMPARISON:  CT 01/06/2023   FINDINGS:  Vascular: Aortic atherosclerosis. The included aorta is normal in  caliber, tortuous.   Mediastinum/nodes: Calcified right hilar lymph nodes consistent with  prior granulomatous disease. Unremarkable esophagus.   Lungs: No focal airspace disease. Benign calcified granuloma in the  right lower lobe no pleural fluid. The included airways are patent.   Upper abdomen: No acute or unexpected findings.   Musculoskeletal: There are no acute or suspicious osseous  abnormalities. Prior median sternotomy.   IMPRESSION:  1.  Aortic Atherosclerosis (ICD10-I70.0).  2. Sequela of prior granulomatous disease with calcified right lower  lobe nodule and lymph nodes, benign needing no further imaging  follow-up.   Electronically Signed    By: Chadwick Colonel M.D.    On: 03/03/2023 02:03 Narrative: CLINICAL DATA:  Post left atrial appendage closure with 31 mm Watchman FLX device.  EXAM: Cardiac CT/CTA  TECHNIQUE: A non-contrast, gated CT scan was obtained with axial slices of 3 mm through the heart for calcium  scoring. Calcium  scoring was performed using the Agatston method. A 120 kV prospective, gated, contrast cardiac scan was obtained. Gantry rotation speed was 250 msecs and collimation was 0.6 mm. Nitroglycerin was not given. A delayed scan was obtained to exclude left atrial appendage thrombus. The 3D dataset was reconstructed in 5% intervals of the 25-50% of the R-R cycle. Late systolic phases were analyzed on a dedicated workstation using MPR, MIP, and VRT modes. The patient received 80 cc of contrast.  FINDINGS: Image quality: excellent.  Noise artifact is: Limited.  Left Atrium: The left atrial size is normal severely dilated. There is a small PFO. There is normal pulmonary vein drainage into the left atrium (2 on the right and 2 on the left).  Left Atrial Appendage: A 31 mm Watchman FLX is present in the appendage.  There is a small ~2.5 mm peri-device leak present with resultant incomplete closure of the LAA. No device related thrombus.  Coronary Arteries: Calcium  scoring not performed due to prior CABG. Normal coronary origin. Right dominance. The study was performed without use of NTG and is insufficient for plaque evaluation. No bypass grafts are visualized. The LIMA is atretic. Stents noted.  Right Atrium: Right atrial size is severely dilated.  Right Ventricle: The right ventricular cavity is within normal limits.  Left Ventricle: The ventricular cavity size is within normal limits.  Pulmonary Artery: Dilated pulmonary artery suggestive of pulmonary hypertension.  Cardiac valves: The aortic valve is trileaflet without significant calcification. The mitral valve is normal structure without significant calcification.  Aorta: Normal caliber with no significant disease.  Pericardium: Normal thickness with no significant effusion or calcium  present.  Extra-cardiac findings: See attached radiology report for non-cardiac structures.  IMPRESSION: 1. A 31 mm Watchman FLX is present in the appendage with a small ~2.5 mm peri-device leak.  2. Severe biatrial enlargement.  3. 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  4. Small PFO.  Melodee Spruce T. Rolm Clos, MD  Electronically Signed: By: Jackquelyn Mass M.D. On: 02/26/2023 21:58 Note: Reviewed        Physical Exam  General appearance: Well nourished, well developed, and well hydrated. In no apparent acute distress Mental status: Alert, oriented x 3 (person, place, & time)       Respiratory: No evidence of acute respiratory distress Eyes: PERLA Vitals: BP (!) 155/79 (Patient Position: Sitting, Cuff Size: Normal)   Pulse 78   Temp (!) 96.6 F (35.9 C) (Temporal)   Resp 18   Ht 5' (1.524 m)   Wt 175 lb (79.4 kg)   SpO2 100%   BMI 34.18 kg/m  BMI: Estimated body mass index is 34.18 kg/m as calculated from the  following:   Height as of this encounter: 5' (1.524 m).   Weight as of this encounter: 175 lb (79.4 kg). Ideal: Ideal body weight: 45.5 kg (100 lb 4.9 oz) Adjusted ideal body weight: 59.1 kg (130 lb 3 oz)  Assessment   Diagnosis Status  1. Chronic pain syndrome   2. Chronic low back pain (1ry area of Pain) (Bilateral) (L>R) w/o sciatica   3. Chronic neck pain (2ry area of Pain) (Bilateral) (L>R)   4. Chronic lower extremity pain (3ry area of Pain) (Bilateral) (L>R)   5. Lumbar facet joint syndrome (Bilateral)   6. Pharmacologic therapy   7. Chronic use of opiate for therapeutic purpose   8. Encounter for medication management   9. Encounter for chronic pain management    Controlled Controlled Controlled   Updated Problems: No problems updated.  Plan of Care  Problem-specific:  Assessment and Plan    Chronic Pain Management Chronic pain persists with current medication, likely due to tolerance. A drug holiday was discussed to reset medication efficacy, emphasizing the risks of increasing dosage. The drug holiday process and the importance of naloxone  for emergency use due to respiratory depression risk were explained. Taper down current pain medication and implement a two-week drug holiday. Include drug holiday information in the after-visit summary. Send refills for the next three months and schedule a follow-up visit before January 16, 2024. Ensure naloxone  is available for emergency use.  Arthritis Arthritis pain is exacerbated by weather changes, and current medication does not address inflammation. Under rheumatologist care, over-the-counter supplements for inflammation were discussed. Include information on these supplements in the after-visit summary.  Blood Thinner Management Following Watchman device implantation, Plavix  is currently used. DVT monitoring was discussed due to reduced mobility from chronic pain. Advised on potential baby aspirin  use if Plavix  is discontinued,  explaining DVT risks including pulmonary embolism, myocardial infarction, and stroke. Continue Plavix  and discuss baby aspirin  with a healthcare provider if Plavix  is discontinued.  General Health Maintenance Compliance with controlled substance regulations, including urine drug screen and pill count, is maintained. The prescription monitoring program is up to date. Monitor compliance with controlled substance regulations.  Follow-up Schedule a follow-up visit before January 16, 2024.       Carmen [name] has a current medication list which includes the following long-term medication(s): atorvastatin , calcium , diphenhydramine, divalproex , famotidine, furosemide , [START ON 10/18/2023] hydrocodone -acetaminophen , [START ON 11/17/2023] hydrocodone -acetaminophen , [START ON 12/17/2023] hydrocodone -acetaminophen , levothyroxine , loratadine , losartan , metoprolol  succinate, [START ON 10/18/2023] naloxone , and trazodone .  Pharmacotherapy (Medications Ordered): Meds ordered this encounter  Medications   HYDROcodone -acetaminophen  (NORCO/VICODIN) 5-325 MG tablet    Sig: Take 1 tablet by mouth 2 (two) times daily as needed for severe pain (pain score 7-10). Must last 30 days.    Dispense:  60 tablet    Refill:  0    DO NOT: delete (not duplicate); no partial-fill (will deny script to complete), no refill request (F/U required). DISPENSE: 1 day early if closed on fill date. WARN: No CNS-depressants within 8 hrs of med.   HYDROcodone -acetaminophen  (NORCO/VICODIN) 5-325 MG tablet    Sig: Take 1 tablet by mouth 2 (two) times daily as needed for severe pain (pain score 7-10). Must last 30 days.    Dispense:  60 tablet    Refill:  0    DO NOT: delete (not duplicate); no partial-fill (will deny script to complete), no refill request (F/U required). DISPENSE: 1 day early if closed on fill date. WARN: No CNS-depressants within 8 hrs of med.   HYDROcodone -acetaminophen  (NORCO/VICODIN) 5-325 MG tablet    Sig: Take 1  tablet by mouth 2 (two) times daily as needed for severe pain (pain score 7-10). Must last 30 days.    Dispense:  60 tablet    Refill:  0    DO NOT: delete (not duplicate); no partial-fill (will deny script to complete), no refill request (F/U required). DISPENSE: 1 day early if closed on fill date. WARN: No CNS-depressants within 8 hrs of med.   naloxone  (NARCAN ) nasal spray 4 mg/0.1 mL    Sig: Place 1 spray into the nose as needed for up to 365 doses (for opioid-induced respiratory depresssion). In case of emergency (overdose), spray once into each nostril. If no response within 3 minutes, repeat application and call 911.    Dispense:  1 each    Refill:  0    Instruct patient in proper use of device.   Orders:  No orders of the defined types were placed in this encounter.  Follow-up plan:   Return in about 13 weeks (around 01/16/2024) for Eval-day (M,W), (F2F), (MM).      Interventional Therapies  Risk Factors  Considerations:   PLAVIX  + ASA Anticoagulation (Stop: 7-10 days  Restart: 2 hours)   Planned  Pending:      Under consideration:   Diagnostic/therapeutic cervical ESI  Therapeutic left lumbar facet RFA #1  Diagnostic/therapeutic right L4-5 LESI #1  Diagnostic/therapeutic right L4 and L5 TFESI #1    Completed:   Diagnostic/Therapeutic right IA Hip & TBI inj. x2 (08/22/2022) (100/100/100/90-95)  Therapeutic right lumbar facet RFA x1 (06/13/2022) (100/100/100/90-100)  Diagnostic right lumbar facet MBB x3 (01/26/2022) (100/100/90/90)  Diagnostic left lumbar facet MBB x3 (01/26/2022) (100/100/90/90)  Diagnostic left cervical facet MBB x1 (06/28/2021) (25/25/0/0)    Therapeutic  Palliative (PRN) options:   Therapeutic lumbar facet MBB       Recent Visits No visits were found meeting these conditions. Showing recent visits within past 90 days and meeting all other requirements Today's Visits Date Type Provider Dept  10/17/23 Office Visit Renaldo Caroli, MD  [facility]  Showing today's visits and meeting all other requirements Future Appointments Date Type Provider Dept  01/09/24 Appointment Renaldo Caroli, MD [facility]  Showing future appointments within next 90 days and meeting all other requirements  I discussed the assessment and treatment plan with the patient. The patient was provided an opportunity to ask questions and all were answered. The patient agreed with the plan and demonstrated an understanding of the instructions.  Patient advised to call back or seek an in-person evaluation if the symptoms or condition worsens.  Duration of encounter: 30 minutes.  Total time on encounter, as per AMA guidelines included both the face-to-face and  non-face-to-face time personally spent by the physician and/or other qualified health care professional(s) on the day of the encounter (includes time in activities that require the physician or other qualified health care professional and does not include time in activities normally performed by clinical staff). Physician's time may include the following activities when performed: Preparing to see the patient (e.g., pre-charting review of records, searching for previously ordered imaging, lab work, and nerve conduction tests) Review of prior analgesic pharmacotherapies. Reviewing PMP Interpreting ordered tests (e.g., lab work, imaging, nerve conduction tests) Performing post-procedure evaluations, including interpretation of diagnostic procedures Obtaining and/or reviewing separately obtained history Performing a medically appropriate examination and/or evaluation Counseling and educating the patient/family/caregiver Ordering medications, tests, or procedures Referring and communicating with other health care professionals (when not separately reported) Documenting clinical information in the electronic or other health record Independently interpreting results (not separately  reported) and communicating results to the patient/ family/caregiver Care coordination (not separately reported)  Note by: Candi Chafe, MD Date: 10/17/2023; Time: 9:01 AM

## 2023-10-16 NOTE — Patient Instructions (Addendum)
 ______________________________________________________________________     Naloxone  Nasal Spray  Why am I receiving this medication? [place]  STOP ACT requires that all patients taking high dose opioids or at risk of opioids respiratory depression, be prescribed an opioid reversal agent, such as Naloxone  (AKA: Narcan ).  What is this medication? NALOXONE  (nal OX one) treats opioid overdose, which causes slow or shallow breathing, severe drowsiness, or trouble staying awake. Call emergency services after using this medication. You may need additional treatment. Naloxone  works by reversing the effects of opioids. It belongs to a group of medications called opioid blockers.  COMMON BRAND NAME(S): Kloxxado , Narcan   What should I tell my care team before I take this medication? They need to know if you have any of these conditions: Heart disease Substance use disorder An unusual or allergic reaction to naloxone , other medications, foods, dyes, or preservatives Pregnant or trying to get pregnant Breast-feeding  When to use this medication? This medication is to be used for the treatment of respiratory depression (less than 8 breaths per minute) secondary to opioid overdose.   How to use this medication? This medication is for use in the nose. Lay the person on their back. Support their neck with your hand and allow the head to tilt back before giving the medication. The nasal spray should be given into 1 nostril. After giving the medication, move the person onto their side. Do not remove or test the nasal spray until ready to use. Get emergency medical help right away after giving the first dose of this medication, even if the person wakes up. You should be familiar with how to recognize the signs and symptoms of a narcotic overdose. If more doses are needed, give the additional dose in the other nostril. Talk to your care team about the use of this medication in children. While this  medication may be prescribed for children as young as newborns for selected conditions, precautions do apply.  Naloxone  Overdosage: If you think you have taken too much of this medicine contact a poison control center or emergency room at once.  NOTE: This medicine is only for you. Do not share this medicine with others.  What if I miss a dose? This does not apply.  What may interact with this medication? This is only used during an emergency. No interactions are expected during emergency use. This list may not describe all possible interactions. Give your health care provider a list of all the medicines, herbs, non-prescription drugs, or dietary supplements you use. Also tell them if you smoke, drink alcohol, or use illegal drugs. Some items may interact with your medicine.  What should I watch for while using this medication? Keep this medication ready for use in the case of an opioid overdose. Make sure that you have the phone number of your care team and local hospital ready. You may need to have additional doses of this medication. Each nasal spray contains a single dose. Some emergencies may require additional doses. After use, bring the treated person to the nearest hospital or call 911. Make sure the treating care team knows that the person has received a dose of this medication. You will receive additional instructions on what to do during and after use of this medication before an emergency occurs.  What side effects may I notice from receiving this medication? Side effects that you should report to your care team as soon as possible: Allergic reactions--skin rash, itching, hives, swelling of the face, lips, tongue, or throat Side  effects that usually do not require medical attention (report these to your care team if they continue or are bothersome): Constipation Dryness or irritation inside the nose Headache Increase in blood pressure Muscle spasms Stuffy nose Toothache This  list may not describe all possible side effects. Call your doctor for medical advice about side effects. You may report side effects to FDA at 1-800-FDA-1088.  Where should I keep my medication? Because this is an emergency medication, you should keep it with you at all times.  Keep out of the reach of children and pets. Store between 20 and 25 degrees C (68 and 77 degrees F). Do not freeze. Throw away any unused medication after the expiration date. Keep in original box until ready to use.  NOTE: This sheet is a summary. It may not cover all possible information. If you have questions about this medicine, talk to your doctor, pharmacist, or health care provider.   2023 Elsevier/Gold Standard (2021-05-27 00:00:00)  ______________________________________________________________________      ______________________________________________________________________    Opioid Pain Medication Update  To: All patients taking opioid pain medications. (I.e.: hydrocodone , hydromorphone, oxycodone , oxymorphone, morphine , codeine, methadone, tapentadol, tramadol, buprenorphine, fentanyl , etc.)  Re: Updated review of side effects and adverse reactions of opioid analgesics, as well as new information about long term effects of this class of medications.  Direct risks of long-term opioid therapy are not limited to opioid addiction and overdose. Potential medical risks include serious fractures, breathing problems during sleep, hyperalgesia, immunosuppression, chronic constipation, bowel obstruction, myocardial infarction, and tooth decay secondary to xerostomia.  Unpredictable adverse effects that can occur even if you take your medication correctly: Cognitive impairment, respiratory depression, and death. Most people think that if they take their medication "correctly", and "as instructed", that they will be safe. Nothing could be farther from the truth. In reality, a significant amount of recorded deaths  associated with the use of opioids has occurred in individuals that had taken the medication for a long time, and were taking their medication correctly. The following are examples of how this can happen: Patient taking his/her medication for a long time, as instructed, without any side effects, is given a certain antibiotic or another unrelated medication, which in turn triggers a "Drug-to-drug interaction" leading to disorientation, cognitive impairment, impaired reflexes, respiratory depression or an untoward event leading to serious bodily harm or injury, including death.  Patient taking his/her medication for a long time, as instructed, without any side effects, develops an acute impairment of liver and/or kidney function. This will lead to a rapid inability of the body to breakdown and eliminate their pain medication, which will result in effects similar to an "overdose", but with the same medicine and dose that they had always taken. This again may lead to disorientation, cognitive impairment, impaired reflexes, respiratory depression or an untoward event leading to serious bodily harm or injury, including death.  A similar problem will occur with patients as they grow older and their liver and kidney function begins to decrease as part of the aging process.  Background information: Historically, the original case for using long-term opioid therapy to treat chronic noncancer pain was based on safety assumptions that subsequent experience has called into question. In 1996, the American Pain Society and the American Academy of Pain Medicine issued a consensus statement supporting long-term opioid therapy. This statement acknowledged the dangers of opioid prescribing but concluded that the risk for addiction was low; respiratory depression induced by opioids was short-lived, occurred mainly in opioid-naive  patients, and was antagonized by pain; tolerance was not a common problem; and efforts to control  diversion should not constrain opioid prescribing. This has now proven to be wrong. Experience regarding the risks for opioid addiction, misuse, and overdose in community practice has failed to support these assumptions.  According to the Centers for Disease Control and Prevention, fatal overdoses involving opioid analgesics have increased sharply over the past decade. Currently, more than 96,700 people die from drug overdoses every year. Opioids are a factor in 7 out of every 10 overdose deaths. Deaths from drug overdose have surpassed motor vehicle accidents as the leading cause of death for individuals between the ages of 26 and 43.  Clinical data suggest that neuroendocrine dysfunction may be very common in both men and women, potentially causing hypogonadism, erectile dysfunction, infertility, decreased libido, osteoporosis, and depression. Recent studies linked higher opioid dose to increased opioid-related mortality. Controlled observational studies reported that long-term opioid therapy may be associated with increased risk for cardiovascular events. Subsequent meta-analysis concluded that the safety of long-term opioid therapy in elderly patients has not been proven.   Side Effects and adverse reactions: Common side effects: Drowsiness (sedation). Dizziness. Nausea and vomiting. Constipation. Physical dependence -- Dependence often manifests with withdrawal symptoms when opioids are discontinued or decreased. Tolerance -- As you take repeated doses of opioids, you require increased medication to experience the same effect of pain relief. Respiratory depression -- This can occur in healthy people, especially with higher doses. However, people with COPD, asthma or other lung conditions may be even more susceptible to fatal respiratory impairment.  Uncommon side effects: An increased sensitivity to feeling pain and extreme response to pain (hyperalgesia). Chronic use of opioids can lead to  this. Delayed gastric emptying (the process by which the contents of your stomach are moved into your small intestine). Muscle rigidity. Immune system and hormonal dysfunction. Quick, involuntary muscle jerks (myoclonus). Arrhythmia. Itchy skin (pruritus). Dry mouth (xerostomia).  Long-term side effects: Chronic constipation. Sleep-disordered breathing (SDB). Increased risk of bone fractures. Hypothalamic-pituitary-adrenal dysregulation. Increased risk of overdose.  RISKS: Respiratory depression and death: Opioids increase the risk of respiratory depression and death.  Drug-to-drug interactions: Opioids are relatively contraindicated in combination with benzodiazepines, sleep inducers, and other central nervous system depressants. Other classes of medications (i.e.: certain antibiotics and even over-the-counter medications) may also trigger or induce respiratory depression in some patients.  Medical conditions: Patients with pre-existing respiratory problems are at higher risk of respiratory failure and/or depression when in combination with opioid analgesics. Opioids are relatively contraindicated in some medical conditions such as central sleep apnea.   Fractures and Falls:  Opioids increase the risk and incidence of falls. This is of particular importance in elderly patients.  Endocrine System:  Long-term administration is associated with endocrine abnormalities (endocrinopathies). (Also known as Opioid-induced Endocrinopathy) Influences on both the hypothalamic-pituitary-adrenal axis?and the hypothalamic-pituitary-gonadal axis have been demonstrated with consequent hypogonadism and adrenal insufficiency in both sexes. Hypogonadism and decreased levels of dehydroepiandrosterone sulfate have been reported in men and women. Endocrine effects include: Amenorrhoea in women (abnormal absence of menstruation) Reduced libido in both sexes Decreased sexual function Erectile dysfunction in  men Hypogonadisms (decreased testicular function with shrinkage of testicles) Infertility Depression and fatigue Loss of muscle mass Anxiety Depression Immune suppression Hyperalgesia Weight gain Anemia Osteoporosis Patients (particularly women of childbearing age) should avoid opioids. There is insufficient evidence to recommend routine monitoring of asymptomatic patients taking opioids in the long-term for hormonal deficiencies.  Immune System: Human  studies have demonstrated that opioids have an immunomodulating effect. These effects are mediated via opioid receptors both on immune effector cells and in the central nervous system. Opioids have been demonstrated to have adverse effects on antimicrobial response and anti-tumour surveillance. Buprenorphine has been demonstrated to have no impact on immune function.  Opioid Induced Hyperalgesia: Human studies have demonstrated that prolonged use of opioids can lead to a state of abnormal pain sensitivity, sometimes called opioid induced hyperalgesia (OIH). Opioid induced hyperalgesia is not usually seen in the absence of tolerance to opioid analgesia. Clinically, hyperalgesia may be diagnosed if the patient on long-term opioid therapy presents with increased pain. This might be qualitatively and anatomically distinct from pain related to disease progression or to breakthrough pain resulting from development of opioid tolerance. Pain associated with hyperalgesia tends to be more diffuse than the pre-existing pain and less defined in quality. Management of opioid induced hyperalgesia requires opioid dose reduction.  Cancer: Chronic opioid therapy has been associated with an increased risk of cancer among noncancer patients with chronic pain. This association was more evident in chronic strong opioid users. Chronic opioid consumption causes significant pathological changes in the small intestine and colon. Epidemiological studies have found  that there is a link between opium  dependence and initiation of gastrointestinal cancers. Cancer is the second leading cause of death after cardiovascular disease. Chronic use of opioids can cause multiple conditions such as GERD, immunosuppression and renal damage as well as carcinogenic effects, which are associated with the incidence of cancers.   Mortality: Long-term opioid use has been associated with increased mortality among patients with chronic non-cancer pain (CNCP).  Prescription of long-acting opioids for chronic noncancer pain was associated with a significantly increased risk of all-cause mortality, including deaths from causes other than overdose.  Reference: Von Korff M, Kolodny A, Deyo RA, Chou R. Long-term opioid therapy reconsidered. Ann Intern Med. 2011 Sep 6;155(5):325-8. doi: 10.7326/[id number]. PMID: 16109604; PMCID: [id number]. Achilles Achilles, Hayward RA, Dunn KM, [place] KP. Risk of adverse events in patients prescribed long-term opioids: A cohort study in the [place] [facility] Research Datalink. Eur J Pain. 2019 May;23(5):908-922. doi: 10.1002/ejp.1357. Epub 2019 Jan 31. PMID: 78295621. Colameco S, Coren JS, Ciervo CA. Continuous opioid treatment for chronic noncancer pain: a time for moderation in prescribing. Postgrad Med. 2009 Jul;121(4):61-6. doi: 10.3810/pgm.2009.07.2032. PMID: 30865784. Orlan Bis RN, [place] SD, Blazina I, Sheliah Deutscher, Bougatsos C, Deyo RA. The effectiveness and risks of long-term opioid therapy for chronic pain: a systematic review for a [employer] of [employer]ways to [employer]. Ann Intern Med. 2015 Feb 17;162(4):276-86. doi: 10.7326/M14-2559. PMID: 69629528. [facility], Makuc DM. NCHS Data Brief No. 22. [place]: Centers for Disease Control and Prevention; 2009. Sep, Increase in Fatal Poisonings Involving Opioid Analgesics in the [place] , 1999-2006. Song IA, Choi  HR, Oh TK. Long-term opioid use and mortality in patients with chronic non-cancer pain: Ten-year follow-up study in [place] from 2010 through 2019. EClinicalMedicine. 2022 Jul 18;51:101558. doi: 10.1016/j.eclinm.2022.413244. PMID: 01027253; PMCID: [id number]. Huser, W., Schubert, T., Vogelmann, T. et al. All-cause mortality in patients with long-term opioid therapy compared with non-opioid analgesics for chronic non-cancer pain: a database study. BMC Med 18, 162 (2020). http://lester.info/ Rashidian H, Zendehdel K, Kamangar F, Malekzadeh R, Haghdoost AA. An Ecological Study of the Association between Opiate Use and Incidence of Cancers. Addict Health. 2016 Fall;8(4):252-260. PMID: 59563875; PMCID: [id number].  Our Goal: Our goal is to control your pain  with means other than the use of opioid pain medications.  Our Recommendation: Talk to your physician about coming off of these medications. We can assist you with the tapering down and stopping these medicines. Based on the new information, even if you cannot completely stop the medication, a decrease in the dose may be associated with a lesser risk. Ask for other means of controlling the pain. Decrease or eliminate those factors that significantly contribute to your pain such as smoking, obesity, and a diet heavily tilted towards "inflammatory" nutrients.  Last Updated: 04/09/2023   ______________________________________________________________________       ______________________________________________________________________    National Pain Medication Shortage  The [place] is experiencing worsening drug shortages. These have had a negative widespread effect on patient care and treatment. Not expected to improve any time soon. Predicted to last past 2029.   Drug shortage list (generic names) Oxycodone  IR Oxycodone /APAP Oxymorphone IR Hydromorphone Hydrocodone /APAP Morphine   Where is the problem?   Manufacturing and supply level.  Will this shortage affect you?  Only if you take any of the above pain medications.  How? You may be unable to fill your prescription.  Your pharmacist may offer a "partial fill" of your prescription. (Warning: Do not accept partial fills.) Prescriptions partially filled cannot be transferred to another pharmacy. Read our Medication Rules and Regulation. Depending on how much medicine you are dependent on, you may experience withdrawals when unable to get the medication.  Recommendations: Consider ending your dependence on opioid pain medications. Ask your pain specialist to assist you with the process. Consider switching to a medication currently not in shortage, such as Buprenorphine. Talk to your pain specialist about this option. Consider decreasing your pain medication requirements by managing tolerance thru "Drug Holidays". This may help minimize withdrawals, should you run out of medicine. Control your pain thru the use of non-pharmacological interventional therapies.   Your prescriber: Prescribers cannot be blamed for shortages. Medication manufacturing and supply issues cannot be fixed by the prescriber.   NOTE: The prescriber is not responsible for supplying the medication, or solving supply issues. Work with your pharmacist to solve it. The patient is responsible for the decision to take or continue taking the medication and for identifying and securing a legal supply source. By law, supplying the medication is the job and responsibility of the pharmacy. The prescriber is responsible for the evaluation, monitoring, and prescribing of these medications.   Prescribers will NOT: Re-issue prescriptions that have been partially filled. Re-issue prescriptions already sent to a pharmacy.  Re-send prescriptions to a different pharmacy because yours did not have your medication. Ask pharmacist to order more medicine or transfer the prescription to another  pharmacy. (Read below.)  New 2023 regulation: "June 02, 2022 Revised Regulation Allows DEA-Registered Pharmacies to Transfer Electronic Prescriptions at a Patient's Request DEA Headquarters Division - Public Information Office Patients now have the ability to request their electronic prescription be transferred to another pharmacy without having to go back to their practitioner to initiate the request. This revised regulation went into effect on Monday, May 29, 2022.     At a patient's request, a DEA-registered retail pharmacy can now transfer an electronic prescription for a controlled substance (schedules II-V) to another DEA-registered retail pharmacy. Prior to this change, patients would have to go through their practitioner to cancel their prescription and have it re-issued to a different pharmacy. The process was taxing and time consuming for both patients and practitioners.    The Drug Enforcement Administration (  DEA) published its intent to revise the process for transferring electronic prescriptions on August 20, 2020.  The final rule was published in the federal register on April 27, 2022 and went into effect 30 days later.  Under the final rule, a prescription can only be transferred once between pharmacies, and only if allowed under existing state or other applicable law. The prescription must remain in its electronic form; may not be altered in any way; and the transfer must be communicated directly between two licensed pharmacists. It's important to note, any authorized refills transfer with the original prescription, which means the entire prescription will be filled at the same pharmacy".  Reference: [email or web address] [facility] website announcement)  [email or web address] [employer] of [employer])   [employer] / Vol.  88, No. 143 / Thursday, April 27, 2022 / Rules and Regulations DEPARTMENT OF JUSTICE  Drug Enforcement Administration  21 CFR Part 1306  [Docket No. [id number]]  RIN [id number] Transfer of Electronic Prescriptions for Schedules II-V Controlled Substances Between Pharmacies for Initial Filling  ______________________________________________________________________       ______________________________________________________________________    Transfer of Pain Medication between Pharmacies  Re: 2023 DEA Clarification on existing regulation  Published on DEA Website: June 02, 2022  Title: Revised Regulation Allows DEA-Registered Pharmacies to Electrical engineer Prescriptions at a Patient's Request DEA Headquarters Division - [employer]  "Patients now have the ability to request their electronic prescription be transferred to another pharmacy without having to go back to their practitioner to initiate the request. This revised regulation went into effect on Monday, May 29, 2022.     At a patient's request, a DEA-registered retail pharmacy can now transfer an electronic prescription for a controlled substance (schedules II-V) to another DEA-registered retail pharmacy. Prior to this change, patients would have to go through their practitioner to cancel their prescription and have it re-issued to a different pharmacy. The process was taxing and time consuming for both patients and practitioners.    The Drug Enforcement Administration [facility]) published its intent to revise the process for transferring electronic prescriptions on August 20, 2020.  The final rule was published in the federal register on April 27, 2022 and went into effect 30 days later.  Under the final rule, a prescription can only be transferred once between pharmacies, and only if allowed under existing state or other applicable law. The prescription must remain in its electronic form; may not be altered in any  way; and the transfer must be communicated directly between two licensed pharmacists. It's important to note, any authorized refills transfer with the original prescription, which means the entire prescription will be filled at the same pharmacy."    REFERENCES: 1. DEA website announcement [email or web address]  2. Department of Justice website  [email or web address]  3. DEPARTMENT OF JUSTICE Drug Enforcement Administration 21 CFR Part 1306 [Docket No. [id number]] RIN [id number] "Transfer of Electronic Prescriptions for Schedules II-V Controlled Substances Between Pharmacies for Initial Filling"  ______________________________________________________________________       ______________________________________________________________________    Medication Rules  Purpose: To inform patients, and their family members, of our medication rules and regulations.  Applies to: All patients receiving prescriptions from our practice (written or electronic).  Pharmacy of record: This is the pharmacy where your electronic prescriptions will be sent. Make sure we have the correct one.  Electronic prescriptions: In compliance with the [place]  Strengthen Opioid Misuse Prevention (STOP) Act of 2017 (Session Law 2017-74/H243), effective October 02, 2018, all controlled substances must be electronically  prescribed. Written prescriptions, faxing, or calling prescriptions to a pharmacy will no longer be done.  Prescription refills: These will be provided only during in-person appointments. No medications will be renewed without a "face-to-face" evaluation with your provider. Applies to all prescriptions.  NOTE: The following applies primarily to controlled substances (Opioid* Pain Medications).   Type of encounter (visit): For patients receiving controlled  substances, face-to-face visits are required. (Not an option and not up to the patient.)  Patient's Responsibilities: Pain Pills: Bring all pain pills to every appointment (except for procedure appointments). Pill counts are required.  Pill Bottles: Bring pills in original pharmacy bottle. Bring bottle, even if empty. Always bring the bottle of the most recent fill.  Medication refills: You are responsible for knowing and keeping track of what medications you are taking and when is it that you will need a refill. The day before your appointment: write a list of all prescriptions that need to be refilled. The day of the appointment: give the list to the admitting nurse. Prescriptions will be written only during appointments. No prescriptions will be written on procedure days. If you forget a medication: it will not be "Called in", "Faxed", or "electronically sent". You will need to get another appointment to get these prescribed. No early refills. Do not call asking to have your prescription filled early. Partial  or short prescriptions: Occasionally your pharmacy may not have enough pills to fill your prescription.  NEVER ACCEPT a partial fill or a prescription that is short of the total amount of pills that you were prescribed.  With controlled substances the law allows 72 hours for the pharmacy to complete the prescription.  If the prescription is not completed within 72 hours, the pharmacist will require a new prescription to be written. This means that you will be short on your medicine and we WILL NOT send another prescription to complete your original prescription.  Instead, request the pharmacy to send a carrier to a nearby branch to get enough medication to provide you with your full prescription. Prescription Accuracy: You are responsible for carefully inspecting your prescriptions before leaving our office. Have the discharge nurse carefully go over each prescription with you, before taking them  home. Make sure that your name is accurately spelled, that your address is correct. Check the name and dose of your medication to make sure it is accurate. Check the number of pills, and the written instructions to make sure they are clear and accurate. Make sure that you are given enough medication to last until your next medication refill appointment. Taking Medication: Take medication as prescribed. When it comes to controlled substances, taking less pills or less frequently than prescribed is permitted and encouraged. Never take more pills than instructed. Never take the medication more frequently than prescribed.  Inform other Doctors: Always inform, all of your healthcare providers, of all the medications you take. Pain Medication from other Providers: You are not allowed to accept any additional pain medication from any other Doctor or Healthcare provider. There are two exceptions to this rule. (see below) In the event that you require additional pain medication, you are responsible for notifying us , as stated below. Cough Medicine: Often these contain an opioid, such as codeine or hydrocodone . Never accept or take cough medicine containing these opioids if you are already taking an opioid* medication. The combination may cause respiratory failure and death. Medication Agreement: You are responsible for carefully reading and following our Medication Agreement. This must be signed  before receiving any prescriptions from our practice. Safely store a copy of your signed Agreement. Violations to the Agreement will result in no further prescriptions. (Additional copies of our Medication Agreement are available upon request.) Laws, Rules, & Regulations: All patients are expected to follow all [address] and [employer], Rules, [place] [employer]. Ignorance of the Laws does not constitute a valid excuse.  Illegal drugs and Controlled Substances: The use of illegal substances (including, but not limited to  marijuana and its derivatives) and/or the illegal use of any controlled substances is strictly prohibited. Violation of this rule may result in the immediate and permanent discontinuation of any and all prescriptions being written by our practice. The use of any illegal substances is prohibited. Adopted CDC guidelines & recommendations: Target dosing levels will be at or below 60 MME/day. Use of benzodiazepines** is not recommended. Urine Drug testing: Patients taking controlled substances will be required to provide a urine sample upon request. Do not void before coming to your medication management appointments. Hold emptying your bladder until you are admitted. The admitting nurse will inform you if a sample is required. Our practice reserves the right to call you at any time to provide a sample. Once receiving the call, you have 24 hours to comply with request. Not providing a sample upon request may result in termination of medication therapy.  Exceptions: There are only two exceptions to the rule of not receiving pain medications from other Healthcare Providers. Exception #1 (Emergencies): In the event of an emergency (i.e.: accident requiring emergency care), you are allowed to receive additional pain medication. However, you are responsible for: As soon as you are able, call our office [phone], at any time of the day or night, and leave a message stating your name, the date and nature of the emergency, and the name and dose of the medication prescribed. In the event that your call is answered by a member of our staff, make sure to document and save the date, time, and the name of the person that took your information.  Exception #2 (Planned Surgery): In the event that you are scheduled by another doctor or dentist to have any type of surgery or procedure, you are allowed (for a period no longer than 30 days), to receive additional pain medication, for the acute post-op pain. However, in this  case, you are responsible for picking up a copy of our "Post-op Pain Management for Surgeons" handout, and giving it to your surgeon or dentist. This document is available at our office, and does not require an appointment to obtain it. Simply go to our office during business hours (Monday-Thursday from 8:00 AM to 4:00 PM) (Friday 8:00 AM to 12:00 Noon) or if you have a scheduled appointment with us , prior to your surgery, and ask for it by name. In addition, you are responsible for: calling our office [phone], at any time of the day or night, and leaving a message stating your name, name of your surgeon, type of surgery, and date of procedure or surgery. Failure to comply with your responsibilities may result in termination of therapy involving the controlled substances.  Consequences:  Non-compliance with the above rules may result in permanent discontinuation of medication prescription therapy. All patients receiving any type of controlled substance is expected to comply with the above patient responsibilities. Not doing so may result in permanent discontinuation of medication prescription therapy. Medication Agreement Violation. Following the above rules, including your responsibilities will help you in  avoiding a Medication Agreement Violation ("Breaking your Pain Medication Contract").  *Opioid medications include: morphine , codeine, oxycodone , oxymorphone, hydrocodone , hydromorphone, meperidine, tramadol, tapentadol, buprenorphine, fentanyl , methadone. **Benzodiazepine medications include: diazepam  (Valium ), alprazolam (Xanax), clonazepam  (Klonopine), lorazepam  (Ativan ), clorazepate (Tranxene), chlordiazepoxide (Librium), estazolam (Prosom), oxazepam (Serax), temazepam (Restoril), triazolam (Halcion) (Last updated: 07/25/2023) ______________________________________________________________________      ______________________________________________________________________    Medication  Recommendations and Reminders  Applies to: All patients receiving prescriptions (written and/or electronic).  Medication Rules & Regulations: You are responsible for reading, knowing, and following our "Medication Rules" document. These exist for your safety and that of others. They are not flexible and neither are we. Dismissing or ignoring them is an act of "non-compliance" that may result in complete and irreversible termination of such medication therapy. For safety reasons, "non-compliance" will not be tolerated. As with the [place] fundamental legal principle of "ignorance of the law is no defense", we will accept no excuses for not having read and knowing the content of documents provided to you by our practice.  Pharmacy of record:  Definition: This is the pharmacy where your electronic prescriptions will be sent.  We do not endorse any particular pharmacy. It is up to you and your insurance to decide what pharmacy to use.  We do not restrict you in your choice of pharmacy. However, once we write for your prescriptions, we will NOT be re-sending more prescriptions to fix restricted supply problems created by your pharmacy, or your insurance.  The pharmacy listed in the electronic medical record should be the one where you want electronic prescriptions to be sent. If you choose to change pharmacy, simply notify our nursing staff. Changes will be made only during your regular appointments and not over the phone.  Recommendations: Keep all of your pain medications in a safe place, under lock and key, even if you live alone. We will NOT replace lost, stolen, or damaged medication. We do not accept "Police Reports" as proof of medications having been stolen. After you fill your prescription, take 1 week's worth of pills and put them away in a safe place. You should keep a separate, properly labeled bottle for this purpose. The remainder should be kept in the original bottle. Use this as your primary  supply, until it runs out. Once it's gone, then you know that you have 1 week's worth of medicine, and it is time to come in for a prescription refill. If you do this correctly, it is unlikely that you will ever run out of medicine. To make sure that the above recommendation works, it is very important that you make sure your medication refill appointments are scheduled at least 1 week before you run out of medicine. To do this in an effective manner, make sure that you do not leave the office without scheduling your next medication management appointment. Always ask the nursing staff to show you in your prescription , when your medication will be running out. Then arrange for the receptionist to get you a return appointment, at least 7 days before you run out of medicine. Do not wait until you have 1 or 2 pills left, to come in. This is very poor planning and does not take into consideration that we may need to cancel appointments due to bad weather, sickness, or emergencies affecting our staff. DO NOT ACCEPT A "Partial Fill": If for any reason your pharmacy does not have enough pills/tablets to completely fill or refill your prescription, do not allow for a "partial fill".  The law allows the pharmacy to complete that prescription within 72 hours, without requiring a new prescription. If they do not fill the rest of your prescription within those 72 hours, you will need a separate prescription to fill the remaining amount, which we will NOT provide. If the reason for the partial fill is your insurance, you will need to talk to the pharmacist about payment alternatives for the remaining tablets, but again, DO NOT ACCEPT A PARTIAL FILL, unless you can trust your pharmacist to obtain the remainder of the pills within 72 hours.  Prescription refills and/or changes in medication(s):  Prescription refills, and/or changes in dose or medication, will be conducted only during scheduled medication management  appointments. (Applies to both, written and electronic prescriptions.) No refills on procedure days. No medication will be changed or started on procedure days. No changes, adjustments, and/or refills will be conducted on a procedure day. Doing so will interfere with the diagnostic portion of the procedure. No phone refills. No medications will be "called into the pharmacy". No Fax refills. No weekend refills. No Holliday refills. No after hours refills.  Remember:  Business hours are:  Monday to Thursday 8:00 AM to 4:00 PM Provider's Schedule: Renaldo Caroli, MD - Appointments are:  Medication management: Monday and Wednesday 8:00 AM to 4:00 PM Procedure day: Tuesday and Thursday 7:30 AM to 4:00 PM Cephus Collin, MD - Appointments are:  Medication management: Tuesday and Thursday 8:00 AM to 4:00 PM Procedure day: Monday and Wednesday 7:30 AM to 4:00 PM (Last update: 07/25/2022) ______________________________________________________________________    ______________________________________________________________________    OTC Supplements:   The following is a list of over-the-counter (OTC) supplements that have been found to have [employer]) studies suggesting that they may be of some benefits when used in moderation in some chronic pain-related conditions.  NOTE:  Always consult with your primary care provider and/or pharmacist before taking any OTC medications to make sure they will not interact with your current medications. Always use manufacturer's recommended dosage.  Supplement Possible benefit May be of benefit in treatment of   Turmeric/curcumin anti-inflammatory Joint and muscle aches and pain.  Glucosamine/chondroitin (triple strength) may slow loss of articular cartilage Joint pain.  Vitamin D -3* may suppress release of chemicals associated with inflammation. Increases tolerance to pain. Joint and muscle aches and pain.   Moringa(+)  anti-inflammatory with mild analgesic effects Joint and muscle aches and pain.  Melatonin(+) Helps reset sleep cycle. Insomnia.  Vitamin B-12* may help keep nerves and blood cells healthy as well as maintaining function of nervous system Nerve pain (Burning pain)  Alpha-Lipoic-Acid (ALA)* antioxidant that may help with nerve health, pain, and blocking the activation of some inflammatory chemicals Diabetic neuropathy and metabolic syndrome  superoxide dismutase (SOD)** Currently being reviewed.   Tiger Balm Currently being reviewed.   hydrolyzed collagen peptides* Currently being reviewed.  Collagen supplementation may increases bone strength, density, and mass; may improve joint stiffness/mobility, and functionality; and may reduce joint pain. Possible chondroprotective effects. May help with protection of joint health.   Methylsulfonylmethane (MSM)* Currently being reviewed.   CBD(+) Currently being reviewed.   Delta-8 THC(+) Currently being reviewed.   *  Generally Recognized As Safe (GRAS) approved substance.-FDA ([employer]) ** "Possibly Safe", but not considered Generally Recognized As Safe (Not GRAS) by the [place]  Food and Drug Administration (FDA) as a food additive. (+) Not considered Generally Recognized As Safe (Not GRAS) by the [place]  Food and Drug Administration (FDA) as  a food additive.  ______________________________________________________________________     ______________________________________________________________________    Drug Holidays  What is a "Drug Holiday"? Drug Holiday: is the name given to the process of slowly tapering down and temporarily stopping the pain medication for the purpose of decreasing or eliminating tolerance to the drug.  Benefits Improved effectiveness Decreased required effective dose Improved pain control End dependence on high dose therapy Decrease cost  of therapy Uncovering "opioid-induced hyperalgesia". (OIH)  What is "opioid hyperalgesia"? It is a paradoxical increase in pain caused by exposure to opioids. Stopping the opioid pain medication, contrary to the expected, it actually decreases or completely eliminates the pain. Ref.: "A comprehensive review of opioid-induced hyperalgesia". Louana Roup, et.al. Pain Physician. 2011 Mar-Apr;14(2):145-61.  What is tolerance? Tolerance: the progressive loss of effectiveness of a pain medicine due to repetitive use. A common problem of opioid pain medications.  How long should a "Drug Holiday" last? Effectiveness depends on the patient staying off all opioid pain medicines for a minimum of 14 consecutive days. (2 weeks) During this time the patient should not be taking any other opioid analgesic medication.  How about just taking less of the medicine? Does not work. Will not accomplish goal of eliminating the excess receptors.  How about switching to a different pain medicine? (AKA. "Opioid rotation") Does not work. Creates the illusion of effectiveness by taking advantage of inaccurate equivalent dose calculations between different opioids. -This "technique" was promoted by studies funded by [employer], such as [employer], creators of "OxyContin ".  Can I stop the medicine "cold [place]"? We do not recommend it. You should always coordinate with your prescribing physician to make the transition as smoothly as possible. Avoid stopping the medicine abruptly without consulting. We recommend a "slow taper".  What is a slow taper? Taper: refers to the gradual decrease in dose.   How do I stop/taper the dose? Slowly. Decrease the daily amount of pills that you take by one (1) pill every seven (7) days. This is called a "slow downward taper". Example: if you normally take four (4) pills per day, drop it to three (3) pills per day for seven (7) days, then to two (2) pills per day for  seven (7) days, then to one (1) per day for seven (7) days, and then stop the medicine. The 14 day "Drug Holiday" starts on the first day without medicine.   Will I experience withdrawals? Unlikely with a slow taper.  What triggers withdrawals? Withdrawals are triggered by the sudden/abrupt stop of high dose opioids. Withdrawals can be avoided by slowly decreasing the dose over a prolonged period of time.  What are withdrawals? Symptoms associated with sudden/abrupt reduction/stopping of high-dose, long-term use of pain medication. Withdrawal are seldom seen on low dose therapy, or patients rarely taking opioid medication.  Early Withdrawal Symptoms may include: Agitation Anxiety Muscle aches Increased tearing Insomnia Runny nose Sweating Yawning  Late symptoms may include: Abdominal cramping Diarrhea Dilated pupils Goose bumps Nausea Vomiting  When could I see withdrawals? Onset: 8-24 hours after last use for most opioids. 12-48 hours for long-acting opioids (i.e.: methadone)  How long could they last? Duration: 4-10 days for most opioids. 14-21 days for long-acting opioids (i.e.: methadone)  What will happen after I complete my "Drug Holiday"? The need and indications for the opioid analgesic will be reviewed before restarting the medication. Dose requirements will likely decrease and the dose will need to be adjusted accordingly.   (Last update: 07/25/2023) ______________________________________________________________________

## 2023-10-17 ENCOUNTER — Ambulatory Visit: Payer: Medicare Other | Attending: Pain Medicine | Admitting: Pain Medicine

## 2023-10-17 ENCOUNTER — Encounter: Payer: Self-pay | Admitting: Pain Medicine

## 2023-10-17 VITALS — BP 155/79 | HR 78 | Temp 96.6°F | Resp 18 | Ht 60.0 in | Wt 175.0 lb

## 2023-10-17 DIAGNOSIS — M545 Low back pain, unspecified: Secondary | ICD-10-CM | POA: Diagnosis not present

## 2023-10-17 DIAGNOSIS — Z79899 Other long term (current) drug therapy: Secondary | ICD-10-CM | POA: Insufficient documentation

## 2023-10-17 DIAGNOSIS — M47816 Spondylosis without myelopathy or radiculopathy, lumbar region: Secondary | ICD-10-CM | POA: Insufficient documentation

## 2023-10-17 DIAGNOSIS — M542 Cervicalgia: Secondary | ICD-10-CM | POA: Insufficient documentation

## 2023-10-17 DIAGNOSIS — M79605 Pain in left leg: Secondary | ICD-10-CM | POA: Insufficient documentation

## 2023-10-17 DIAGNOSIS — G894 Chronic pain syndrome: Secondary | ICD-10-CM | POA: Insufficient documentation

## 2023-10-17 DIAGNOSIS — Z79891 Long term (current) use of opiate analgesic: Secondary | ICD-10-CM | POA: Insufficient documentation

## 2023-10-17 DIAGNOSIS — M79604 Pain in right leg: Secondary | ICD-10-CM | POA: Insufficient documentation

## 2023-10-17 DIAGNOSIS — G8929 Other chronic pain: Secondary | ICD-10-CM | POA: Diagnosis not present

## 2023-10-17 MED ORDER — HYDROCODONE-ACETAMINOPHEN 5-325 MG PO TABS: 1.0000 | ORAL_TABLET | Freq: Two times a day (BID) | ORAL | 0 refills | Status: DC | PRN

## 2023-10-17 MED ORDER — NALOXONE HCL 4 MG/0.1ML NA LIQD: 1.0000 | NASAL | 0 refills | Status: AC | PRN

## 2023-10-17 NOTE — Progress Notes (Signed)
 Nursing Pain Medication Assessment:  Safety precautions to be maintained throughout the outpatient stay will include: orient to surroundings, keep bed in low position, maintain call bell within reach at all times, provide assistance with transfer out of bed and ambulation.   Medication Inspection Compliance: Pill count conducted under aseptic conditions, in front of the patient. Neither the pills nor the bottle was removed from the patient's sight at any time. Once count was completed pills were immediately returned to the patient in their original bottle.  Medication: Hydrocodone /APAP  Pill/Patch Count:  32 of 60 pills remain Pill/Patch Appearance: Markings consistent with prescribed medication Bottle Appearance: Standard pharmacy container. Clearly labeled. Filled Date: 01 / 02 / 2025 Last Medication intake:  Today

## 2023-10-22 DIAGNOSIS — Z79899 Other long term (current) drug therapy: Secondary | ICD-10-CM | POA: Diagnosis not present

## 2023-10-22 DIAGNOSIS — M1180 Other specified crystal arthropathies, unspecified site: Secondary | ICD-10-CM | POA: Diagnosis not present

## 2023-10-24 ENCOUNTER — Telehealth: Payer: Self-pay | Admitting: Neurology

## 2023-10-24 ENCOUNTER — Ambulatory Visit: Payer: Medicare Other | Admitting: Neurology

## 2023-10-24 NOTE — Telephone Encounter (Signed)
Pt's daughter cx appt due to the weather. Rescheduled and wait listed

## 2023-11-01 ENCOUNTER — Encounter: Payer: Self-pay | Admitting: Family

## 2023-11-01 ENCOUNTER — Ambulatory Visit (INDEPENDENT_AMBULATORY_CARE_PROVIDER_SITE_OTHER): Payer: Medicare Other | Admitting: Family

## 2023-11-01 VITALS — BP 118/76 | HR 82 | Temp 97.7°F | Ht 60.0 in | Wt 167.0 lb

## 2023-11-01 DIAGNOSIS — Z7901 Long term (current) use of anticoagulants: Secondary | ICD-10-CM

## 2023-11-01 DIAGNOSIS — R1319 Other dysphagia: Secondary | ICD-10-CM | POA: Diagnosis not present

## 2023-11-01 DIAGNOSIS — R12 Heartburn: Secondary | ICD-10-CM | POA: Diagnosis not present

## 2023-11-01 MED ORDER — PANTOPRAZOLE SODIUM 40 MG PO TBEC: 40.0000 mg | DELAYED_RELEASE_TABLET | Freq: Two times a day (BID) | ORAL | 0 refills | Status: DC

## 2023-11-01 NOTE — Assessment & Plan Note (Addendum)
Try to decrease and or avoid spicy foods, fried fatty foods, and also caffeine and chocolate as these can increase heartburn symptoms.  Start pantoprazole 40 mg bid for 30 days as worry for ulcer with NSAID and hydrocodone use.  Can continue pepcid.  Testing for h pylori pending results.

## 2023-11-01 NOTE — Assessment & Plan Note (Signed)
Suspect may be exacerbated by heart burn, which we are treating today.  Ordering swallow study. If negative will consider referral to GI

## 2023-11-01 NOTE — Assessment & Plan Note (Signed)
On plavix, so will start pantoprazole.  Educated on taking NSAIDS with blood thinner, worry for ulcer and or GI bleed. Tylenol if needed, I advised not to take her aleve prn, watch total tylenol up to 3 g only (hydrocodone as acetaminophen as well)

## 2023-11-01 NOTE — Progress Notes (Signed)
Established Patient Office Visit  Subjective:   Patient ID: Carmen [name], female    DOB: 11-Jan-1941  Age: 83 y.o. MRN: [id number]  CC:  Chief Complaint  Patient presents with   Acute Visit    Reports nausea and trouble swallowing     HPI: Carmen [name] is a 83 y.o. female presenting on 11/01/2023 for Acute Visit (Reports nausea and trouble swallowing )   Has had nausea on and off for a while now. She does have increased heart burn. She is trying to cut out tomatos and GERD related foods.  Does take pain medication, recently changed this a bit, she is on pepcid at current. She does find taking tums is helpful, doesn't take daily. She is taking motrin as well prn per her pain management doctor.   She states when she is swallowing she feels it 'stays in her throat'. She does gag at times when trying ot swallow. Not daily. Worse with thicker foods, she has false teeth which makes it hard to chew down the food.         ROS: Negative unless specifically indicated above in HPI.   Relevant past medical history reviewed and updated as indicated.   Allergies and medications reviewed and updated.   Current Outpatient Medications:    atorvastatin (LIPITOR) 40 MG tablet, Take 1 tablet (40 mg total) by mouth at bedtime., Disp: 90 tablet, Rfl: 3   CALCIUM PO, Take 1 tablet by mouth daily., Disp: , Rfl:    Cholecalciferol (VITAMIN D-3) 25 MCG (1000 UT) CAPS, Take 1,000 Units by mouth daily., Disp: , Rfl:    clopidogrel (PLAVIX) 75 MG tablet, Take 1 tablet (75 mg total) by mouth daily., Disp: , Rfl:    cyanocobalamin (VITAMIN B12) 500 MCG tablet, Take 500 mcg by mouth daily., Disp: , Rfl:    diphenhydrAMINE (BENADRYL) 25 MG tablet, Take 25 mg by mouth at bedtime as needed for allergies., Disp: , Rfl:    divalproex (DEPAKOTE) 500 MG DR tablet, Take 1 tablet (500 mg total) by mouth at bedtime., Disp: 90 tablet, Rfl: 3   famotidine (PEPCID AC) 10 MG tablet, Take 10 mg by mouth daily as needed  for heartburn or indigestion., Disp: , Rfl:    furosemide (LASIX) 20 MG tablet, Take 1 tablet (20 mg total) by mouth daily as needed (for swelling)., Disp: 90 tablet, Rfl: 3   HYDROcodone-acetaminophen (NORCO/VICODIN) 5-325 MG tablet, Take 1 tablet by mouth 2 (two) times daily as needed for severe pain (pain score 7-10). Must last 30 days., Disp: 60 tablet, Rfl: 0   [START ON 11/17/2023] HYDROcodone-acetaminophen (NORCO/VICODIN) 5-325 MG tablet, Take 1 tablet by mouth 2 (two) times daily as needed for severe pain (pain score 7-10). Must last 30 days., Disp: 60 tablet, Rfl: 0   [START ON 12/17/2023] HYDROcodone-acetaminophen (NORCO/VICODIN) 5-325 MG tablet, Take 1 tablet by mouth 2 (two) times daily as needed for severe pain (pain score 7-10). Must last 30 days., Disp: 60 tablet, Rfl: 0   hydroxychloroquine (PLAQUENIL) 200 MG tablet, Take 200 mg by mouth., Disp: , Rfl:    levothyroxine (SYNTHROID) 137 MCG tablet, Take 1 tablet (137 mcg total) by mouth daily before breakfast., Disp: 90 tablet, Rfl: 3   loratadine (CLARITIN) 10 MG tablet, Take 1 tablet (10 mg total) by mouth daily., Disp: 30 tablet, Rfl: 11   losartan (COZAAR) 25 MG tablet, Take 1/2 tablet once daily, Disp: 45 tablet, Rfl: 3   magnesium oxide (MAG-OX) 400 (240 Mg)  MG tablet, Take 400 mg by mouth daily., Disp: , Rfl:    metoprolol succinate (TOPROL-XL) 100 MG 24 hr tablet, Take 0.5 tablets (50 mg total) by mouth daily. Take with or immediately following a meal., Disp: 45 tablet, Rfl: 3   naloxone (NARCAN) nasal spray 4 mg/0.1 mL, Place 1 spray into the nose as needed for up to 365 doses (for opioid-induced respiratory depresssion). In case of emergency (overdose), spray once into each nostril. If no response within 3 minutes, repeat application and call 911., Disp: 1 each, Rfl: 0   pantoprazole (PROTONIX) 40 MG tablet, Take 1 tablet (40 mg total) by mouth 2 (two) times daily., Disp: 60 tablet, Rfl: 0   traZODone (DESYREL) 100 MG tablet, TAKE  1 TABLET BY MOUTH AT BEDTIME AS NEEDED FOR SLEEP, Disp: 90 tablet, Rfl: 3  No Known Allergies  Objective:   BP 118/76 (BP Location: Left Arm, Patient Position: Sitting, Cuff Size: Normal)   Pulse 82   Temp 97.7 F (36.5 C) (Temporal)   Ht 5' (1.524 m)   Wt 167 lb (75.8 kg)   SpO2 98%   BMI 32.61 kg/m    Physical Exam Constitutional:      General: She is not in acute distress.    Appearance: Normal appearance. She is normal weight. She is not ill-appearing, toxic-appearing or diaphoretic.  HENT:     Head: Normocephalic.  Cardiovascular:     Rate and Rhythm: Normal rate.  Pulmonary:     Effort: Pulmonary effort is normal.  Abdominal:     Tenderness: There is abdominal tenderness in the epigastric area.  Musculoskeletal:        General: Normal range of motion.  Neurological:     General: No focal deficit present.     Mental Status: She is alert and oriented to person, place, and time. Mental status is at baseline.  Psychiatric:        Mood and Affect: Mood normal.        Behavior: Behavior normal.        Thought Content: Thought content normal.        Judgment: Judgment normal.     Assessment & Plan:  Heartburn Assessment & Plan: Try to decrease and or avoid spicy foods, fried fatty foods, and also caffeine and chocolate as these can increase heartburn symptoms.  Start pantoprazole 40 mg bid for 30 days as worry for ulcer with NSAID and hydrocodone use.  Can continue pepcid.  Testing for h pylori pending results.   Orders: -     Pantoprazole Sodium; Take 1 tablet (40 mg total) by mouth 2 (two) times daily.  Dispense: 60 tablet; Refill: 0 -     H. pylori breath test  Esophageal dysphagia Assessment & Plan: Suspect may be exacerbated by heart burn, which we are treating today.  Ordering swallow study. If negative will consider referral to GI  Orders: -     DG ESOPHAGUS W DOUBLE CM (HD); Future  On anticoagulant therapy Assessment & Plan: On plavix, so will  start pantoprazole.  Educated on taking NSAIDS with blood thinner, worry for ulcer and or GI bleed. Tylenol if needed, I advised not to take her aleve prn, watch total tylenol up to 3 g only (hydrocodone as acetaminophen as well)      Follow up plan: Return in about 3 weeks (around 11/22/2023).  Mort Sawyers, FNP

## 2023-11-01 NOTE — Patient Instructions (Signed)
  Ordering swallow study  Please let us know if you have not heard back within 2 weeks about the referral.  Trial pantoprazole once daily for thirty days to see if this helps with heartburn.  Try to decrease and or avoid spicy foods, fried fatty foods, and also caffeine and chocolate as these can increase heartburn symptoms.

## 2023-11-06 ENCOUNTER — Other Ambulatory Visit: Payer: Self-pay | Admitting: Family

## 2023-11-06 DIAGNOSIS — R12 Heartburn: Secondary | ICD-10-CM

## 2023-11-06 DIAGNOSIS — A048 Other specified bacterial intestinal infections: Secondary | ICD-10-CM

## 2023-11-06 LAB — H. PYLORI BREATH TEST: H. pylori Breath Test: DETECTED — AB

## 2023-11-06 MED ORDER — BISMUTH/METRONIDAZ/TETRACYCLIN 140-125-125 MG PO CAPS: 3.0000 | ORAL_CAPSULE | Freq: Four times a day (QID) | ORAL | 0 refills | Status: DC

## 2023-11-06 NOTE — Progress Notes (Signed)
Unfortunately Tanda has h pylori however it is good to know because this makes sense for her symptoms which can cause a lot of GI discomfort, heartburn and upper chest discomfort.   Unfortunately it is multiple capsules a day for 14 days but it should get this bacteria out.   I am sending in a combination medication, but it may be $$ and not covered I just don't know until I send it. If it is too expensive I will send in another combination of meds just will be more tablets. You will take this combination tablet (pylera) along with the pantopazole 40 mg twice daily (she is already on this prescription). She will do this for 14 days. In one month she can come back in and retest to make sure that the h pylori has resolved.   We can keep the swallow study ordered but I wonder if treating this might make it feel less as though something is stuck in her throat. I will leave order as is and we will see how she does with treatment. Keep me updated.

## 2023-11-07 ENCOUNTER — Encounter: Payer: Self-pay | Admitting: Family

## 2023-11-07 ENCOUNTER — Ambulatory Visit: Payer: Medicare Other | Admitting: Neurology

## 2023-11-08 ENCOUNTER — Telehealth: Payer: Self-pay | Admitting: Family

## 2023-11-08 ENCOUNTER — Telehealth: Payer: Self-pay

## 2023-11-08 ENCOUNTER — Other Ambulatory Visit: Payer: Self-pay | Admitting: *Deleted

## 2023-11-08 ENCOUNTER — Other Ambulatory Visit (HOSPITAL_COMMUNITY): Payer: Self-pay

## 2023-11-08 MED ORDER — BISMUTH/METRONIDAZ/TETRACYCLIN 140-125-125 MG PO CAPS: 3.0000 | ORAL_CAPSULE | Freq: Four times a day (QID) | ORAL | 0 refills | Status: DC

## 2023-11-08 NOTE — Telephone Encounter (Signed)
 Pharmacy Patient Advocate Encounter   Received notification from Pt Calls Messages that prior authorization for Bismuth /Metronidaz/Tetracyclin 140-125-125MG  capsules is required/requested.   Insurance verification completed.   The patient is insured through [facility] .   Per test claim: PA required; PA submitted to above mentioned insurance via CoverMyMeds Key/confirmation #/EOC [id number] Status is pending

## 2023-11-08 NOTE — Telephone Encounter (Signed)
 Pharmacy Patient Advocate Encounter  Received notification from [facility] that Prior Authorization for Bismuth /Metronidaz/Tetracyclin 140-125-125MG  capsules  has been APPROVED from 11/08/2023 to 11/07/2024. Ran test claim, Copay is $99.00. This test claim was processed through [facility]- copay amounts may vary at other pharmacies due to pharmacy/plan contracts, or as the patient moves through the different stages of their insurance plan.   PA #/Case ID/Reference #: [id number]

## 2023-11-08 NOTE — Telephone Encounter (Signed)
 PA request has been Submitted. New Encounter created for follow up. For additional info see Pharmacy Prior Auth telephone encounter from 11/08/2023.

## 2023-11-08 NOTE — Telephone Encounter (Signed)
 Copied from CRM [id number]. Topic: Clinical - Prescription Issue >> Nov 08, 2023 12:01 PM Russell PARAS wrote: Reason for CRM: Patient's daughter called in to let Carmen [name] know that if there is an alternative medication available instead of the prescribed Pylera, she would like to go ahead and have it called into the [place] pharmacy on file. She has been speaking with Carmen [name] due to the Pylera requiring a PA. CB# [phone]

## 2023-11-08 NOTE — Telephone Encounter (Signed)
 Copied from CRM [id number]. Topic: Clinical - Medication Question >> Nov 08, 2023  9:41 AM Laymon HERO wrote: Reason for CRM: Patient was seen the other day and was prescribed Bismuth /Metronidaz/Tetracyclin (PYLERA) 140-125-125 MG CAPS. The medication is coming out to be over $1000 at the pharmacy with insurance. Daughter is asking if there is an alternative that can be called in

## 2023-11-08 NOTE — Telephone Encounter (Signed)
 Spoke with pt's daughter. States that Carmen [name] is needing something from our office about her pylera prescription. Called [facility] and was told that it needed at PA. Will route this message to our pharmacy team to handle.

## 2023-11-09 ENCOUNTER — Telehealth: Payer: Self-pay

## 2023-11-09 DIAGNOSIS — A048 Other specified bacterial intestinal infections: Secondary | ICD-10-CM

## 2023-11-09 DIAGNOSIS — R12 Heartburn: Secondary | ICD-10-CM

## 2023-11-09 MED ORDER — TETRACYCLINE HCL 250 MG PO CAPS: 250.0000 mg | ORAL_CAPSULE | Freq: Four times a day (QID) | ORAL | 0 refills | Status: AC

## 2023-11-09 MED ORDER — PANTOPRAZOLE SODIUM 40 MG PO TBEC: 40.0000 mg | DELAYED_RELEASE_TABLET | Freq: Two times a day (BID) | ORAL | 0 refills | Status: DC

## 2023-11-09 MED ORDER — METRONIDAZOLE 250 MG PO TABS: 250.0000 mg | ORAL_TABLET | Freq: Four times a day (QID) | ORAL | 0 refills | Status: AC

## 2023-11-09 NOTE — Addendum Note (Signed)
 Addended by: Valincia Touch K on: 11/09/2023 06:14 PM   Modules accepted: Orders

## 2023-11-09 NOTE — Telephone Encounter (Signed)
 Copied from CRM [id number]. Topic: Clinical - Prescription Issue >> Nov 09, 2023 11:15 AM Corin V wrote: Reason for CRM: Patient's son in law, Clint, called and let PCP know that [facility] stated insurance is still not wanting to cover the Pylera Rx since it has an over the counter component. He said [facility] suggested that it be resent in as the 2 individual components and to not prescribe the over the counter component so that insurance will pay for their portions and the over the counter portion can be paid out of pocket. Agent advised that prior auth had been completed and the pylera had been approved as ordered. He asked if that could be looked into for future orders but for now to send as the 2 components since she is out of the medication. He also asked if her protonix  could be [facility] to [facility], [place] as well so he can pick up all medication together. Please let him know when all items have been sent to the pharmacy.

## 2023-11-09 NOTE — Telephone Encounter (Signed)
 [facility]oke with [facility] to verify correct instructions for dividing Pylera prescription.  They do not have metronidazole  375 mg tablets in stock, and tetracycline  does not come in 375 mg dose.  For this reason we will prescribe metronidazole  to 250 mg 4 times daily x 14 days and tetracycline  250 mg 4 times daily x 14 days.  Both prescriptions verified with Jon, [facility] at [facility] [place].  Refill provided for pantoprazole  40 mg twice daily to [facility] per family request.  Spoke with patient's family, provided instructions for each medication.  The bismuth  will be in stock tomorrow afternoon per [facility] pharmacy.  They are aware.

## 2023-11-13 NOTE — Telephone Encounter (Signed)
Thank you kate for all of the time you put into this. It is much appreciated.

## 2023-11-19 ENCOUNTER — Ambulatory Visit (INDEPENDENT_AMBULATORY_CARE_PROVIDER_SITE_OTHER): Payer: Medicare Other | Admitting: Family

## 2023-11-19 ENCOUNTER — Encounter: Payer: Self-pay | Admitting: Family

## 2023-11-19 VITALS — BP 122/88 | HR 84 | Temp 97.7°F | Ht 60.0 in | Wt 168.6 lb

## 2023-11-19 DIAGNOSIS — M79605 Pain in left leg: Secondary | ICD-10-CM

## 2023-11-19 DIAGNOSIS — G8929 Other chronic pain: Secondary | ICD-10-CM

## 2023-11-19 DIAGNOSIS — R531 Weakness: Secondary | ICD-10-CM

## 2023-11-19 DIAGNOSIS — M79604 Pain in right leg: Secondary | ICD-10-CM | POA: Diagnosis not present

## 2023-11-19 DIAGNOSIS — R296 Repeated falls: Secondary | ICD-10-CM | POA: Diagnosis not present

## 2023-11-19 DIAGNOSIS — A048 Other specified bacterial intestinal infections: Secondary | ICD-10-CM | POA: Insufficient documentation

## 2023-11-19 DIAGNOSIS — Z9989 Dependence on other enabling machines and devices: Secondary | ICD-10-CM

## 2023-11-19 NOTE — Assessment & Plan Note (Signed)
Currently being treated with medication regimen, reinforced that symptoms will improve with duration of treatment or at least should.  Try to stay away from GERD triggers.  Advised to repeat H. pylori testing 2 weeks after completing a 14-day regimen.  If no improvement and worsening with positive H. pylori will refer to gastroenterology.

## 2023-11-19 NOTE — Assessment & Plan Note (Signed)
With associated weakness and instability Ordering rolling walker with seat with larger wheels so that patient can be more mobile

## 2023-11-19 NOTE — Progress Notes (Signed)
Established Patient Office Visit  Subjective:   Patient ID: Carmen [name], female    DOB: [birth year]  Age: 83 y.o. MRN: [id number]  CC:  Chief Complaint  Patient presents with   Medical Management of Chronic Issues    HPI: Carmen [name] is a 83 y.o. female presenting on 11/19/2023 for Medical Management of Chronic Issues  H pylori: currently on day six starting today, took a while to get the medications.  She does still report increased heartburn and epigastric pain she is not vomiting but she does feel nauseous and has a decreased appetite.  She is trying to avoid milk products with the metronidazole.  She has also came attention to heartburn triggers and trying to avoid them  Bil LE weakness, trouble with gait, h/o cerebral infarction which affects strength. She does use a roller with wheels but needs bigger wheels for improved outdoor use. She uses an [employer] inside which she is using twice a week.       ROS: Negative unless specifically indicated above in HPI.   Relevant past medical history reviewed and updated as indicated.   Allergies and medications reviewed and updated.   Current Outpatient Medications:    atorvastatin (LIPITOR) 40 MG tablet, Take 1 tablet (40 mg total) by mouth at bedtime., Disp: 90 tablet, Rfl: 3   CALCIUM PO, Take 1 tablet by mouth daily., Disp: , Rfl:    Cholecalciferol (VITAMIN D-3) 25 MCG (1000 UT) CAPS, Take 1,000 Units by mouth daily., Disp: , Rfl:    clopidogrel (PLAVIX) 75 MG tablet, Take 1 tablet (75 mg total) by mouth daily., Disp: , Rfl:    cyanocobalamin (VITAMIN B12) 500 MCG tablet, Take 500 mcg by mouth daily., Disp: , Rfl:    diphenhydrAMINE (BENADRYL) 25 MG tablet, Take 25 mg by mouth at bedtime as needed for allergies., Disp: , Rfl:    divalproex (DEPAKOTE) 500 MG DR tablet, Take 1 tablet (500 mg total) by mouth at bedtime., Disp: 90 tablet, Rfl: 3   famotidine (PEPCID AC) 10 MG tablet, Take 10 mg by mouth daily as needed for  heartburn or indigestion., Disp: , Rfl:    furosemide (LASIX) 20 MG tablet, Take 1 tablet (20 mg total) by mouth daily as needed (for swelling)., Disp: 90 tablet, Rfl: 3   HYDROcodone-acetaminophen (NORCO/VICODIN) 5-325 MG tablet, Take 1 tablet by mouth 2 (two) times daily as needed for severe pain (pain score 7-10). Must last 30 days., Disp: 60 tablet, Rfl: 0   [START ON 12/17/2023] HYDROcodone-acetaminophen (NORCO/VICODIN) 5-325 MG tablet, Take 1 tablet by mouth 2 (two) times daily as needed for severe pain (pain score 7-10). Must last 30 days., Disp: 60 tablet, Rfl: 0   hydroxychloroquine (PLAQUENIL) 200 MG tablet, Take 200 mg by mouth., Disp: , Rfl:    levothyroxine (SYNTHROID) 137 MCG tablet, Take 1 tablet (137 mcg total) by mouth daily before breakfast., Disp: 90 tablet, Rfl: 3   loratadine (CLARITIN) 10 MG tablet, Take 1 tablet (10 mg total) by mouth daily., Disp: 30 tablet, Rfl: 11   losartan (COZAAR) 25 MG tablet, Take 1/2 tablet once daily, Disp: 45 tablet, Rfl: 3   magnesium oxide (MAG-OX) 400 (240 Mg) MG tablet, Take 400 mg by mouth daily., Disp: , Rfl:    metoprolol succinate (TOPROL-XL) 100 MG 24 hr tablet, Take 0.5 tablets (50 mg total) by mouth daily. Take with or immediately following a meal., Disp: 45 tablet, Rfl: 3   metroNIDAZOLE (FLAGYL) 250 MG tablet, Take  1 tablet (250 mg total) by mouth 4 (four) times daily for 14 days., Disp: 56 tablet, Rfl: 0   naloxone (NARCAN) nasal spray 4 mg/0.1 mL, Place 1 spray into the nose as needed for up to 365 doses (for opioid-induced respiratory depresssion). In case of emergency (overdose), spray once into each nostril. If no response within 3 minutes, repeat application and call 911., Disp: 1 each, Rfl: 0   pantoprazole (PROTONIX) 40 MG tablet, Take 1 tablet (40 mg total) by mouth 2 (two) times daily., Disp: 60 tablet, Rfl: 0   tetracycline (SUMYCIN) 250 MG capsule, Take 1 capsule (250 mg total) by mouth 4 (four) times daily for 14 days., Disp: 56  capsule, Rfl: 0   traZODone (DESYREL) 100 MG tablet, TAKE 1 TABLET BY MOUTH AT BEDTIME AS NEEDED FOR SLEEP, Disp: 90 tablet, Rfl: 3   HYDROcodone-acetaminophen (NORCO/VICODIN) 5-325 MG tablet, Take 1 tablet by mouth 2 (two) times daily as needed for severe pain (pain score 7-10). Must last 30 days., Disp: 60 tablet, Rfl: 0  No Known Allergies  Objective:   BP 122/88 (BP Location: Right Arm, Patient Position: Sitting, Cuff Size: Normal)   Pulse 84   Temp 97.7 F (36.5 C) (Temporal)   Ht 5' (1.524 m)   Wt 168 lb 9.6 oz (76.5 kg)   SpO2 100%   BMI 32.93 kg/m    Physical Exam Constitutional:      General: She is not in acute distress.    Appearance: Normal appearance. She is normal weight. She is not ill-appearing, toxic-appearing or diaphoretic.  HENT:     Head: Normocephalic.  Cardiovascular:     Rate and Rhythm: Normal rate and regular rhythm.  Pulmonary:     Effort: Pulmonary effort is normal.     Breath sounds: Normal breath sounds.  Abdominal:     General: Abdomen is flat.     Tenderness: There is abdominal tenderness (epigastric).  Musculoskeletal:        General: Normal range of motion.     Comments: Bil LE weakness  Neurological:     General: No focal deficit present.     Mental Status: She is alert and oriented to person, place, and time. Mental status is at baseline.  Psychiatric:        Mood and Affect: Mood normal.        Behavior: Behavior normal.        Thought Content: Thought content normal.        Judgment: Judgment normal.     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 pylori infection Assessment & Plan: Currently being treated with medication regimen, reinforced that symptoms will improve with duration of treatment or at least should.  Try to stay away from GERD triggers.  Advised to repeat H. pylori testing 2 weeks after completing a 14-day regimen.  If no improvement and worsening with positive H. pylori will refer to gastroenterology.  Follow up plan: Return in about 6 months (around 05/18/2024) for follow up hypothyroid.  Mort Sawyers, FNP

## 2023-11-20 DIAGNOSIS — R269 Unspecified abnormalities of gait and mobility: Secondary | ICD-10-CM | POA: Diagnosis not present

## 2023-12-01 ENCOUNTER — Inpatient Hospital Stay
Admission: EM | Admit: 2023-12-01 | Discharge: 2023-12-07 | DRG: 065 | Disposition: A | Discharge status: Skilled Nursing Facility | Attending: Internal Medicine | Admitting: Internal Medicine

## 2023-12-01 ENCOUNTER — Emergency Department

## 2023-12-01 ENCOUNTER — Other Ambulatory Visit: Payer: Self-pay

## 2023-12-01 ENCOUNTER — Observation Stay

## 2023-12-01 DIAGNOSIS — I69354 Hemiplegia and hemiparesis following cerebral infarction affecting left non-dominant side: Secondary | ICD-10-CM | POA: Diagnosis not present

## 2023-12-01 DIAGNOSIS — K219 Gastro-esophageal reflux disease without esophagitis: Secondary | ICD-10-CM | POA: Diagnosis not present

## 2023-12-01 DIAGNOSIS — I739 Peripheral vascular disease, unspecified: Secondary | ICD-10-CM | POA: Diagnosis not present

## 2023-12-01 DIAGNOSIS — S299XXA Unspecified injury of thorax, initial encounter: Secondary | ICD-10-CM | POA: Diagnosis not present

## 2023-12-01 DIAGNOSIS — M47816 Spondylosis without myelopathy or radiculopathy, lumbar region: Secondary | ICD-10-CM | POA: Diagnosis not present

## 2023-12-01 DIAGNOSIS — Z951 Presence of aortocoronary bypass graft: Secondary | ICD-10-CM

## 2023-12-01 DIAGNOSIS — Z9181 History of falling: Secondary | ICD-10-CM | POA: Diagnosis not present

## 2023-12-01 DIAGNOSIS — G8929 Other chronic pain: Secondary | ICD-10-CM

## 2023-12-01 DIAGNOSIS — R296 Repeated falls: Secondary | ICD-10-CM | POA: Diagnosis present

## 2023-12-01 DIAGNOSIS — S199XXA Unspecified injury of neck, initial encounter: Secondary | ICD-10-CM | POA: Diagnosis not present

## 2023-12-01 DIAGNOSIS — Z95818 Presence of other cardiac implants and grafts: Secondary | ICD-10-CM | POA: Diagnosis not present

## 2023-12-01 DIAGNOSIS — I700 Atherosclerosis of aorta: Secondary | ICD-10-CM | POA: Diagnosis not present

## 2023-12-01 DIAGNOSIS — I6522 Occlusion and stenosis of left carotid artery: Secondary | ICD-10-CM | POA: Diagnosis present

## 2023-12-01 DIAGNOSIS — W19XXXA Unspecified fall, initial encounter: Secondary | ICD-10-CM | POA: Diagnosis present

## 2023-12-01 DIAGNOSIS — M069 Rheumatoid arthritis, unspecified: Secondary | ICD-10-CM | POA: Diagnosis not present

## 2023-12-01 DIAGNOSIS — Z9861 Coronary angioplasty status: Secondary | ICD-10-CM

## 2023-12-01 DIAGNOSIS — Z7902 Long term (current) use of antithrombotics/antiplatelets: Secondary | ICD-10-CM | POA: Diagnosis not present

## 2023-12-01 DIAGNOSIS — Z79899 Other long term (current) drug therapy: Secondary | ICD-10-CM

## 2023-12-01 DIAGNOSIS — I63521 Cerebral infarction due to unspecified occlusion or stenosis of right anterior cerebral artery: Principal | ICD-10-CM | POA: Diagnosis present

## 2023-12-01 DIAGNOSIS — E785 Hyperlipidemia, unspecified: Secondary | ICD-10-CM | POA: Diagnosis present

## 2023-12-01 DIAGNOSIS — Z87891 Personal history of nicotine dependence: Secondary | ICD-10-CM | POA: Diagnosis not present

## 2023-12-01 DIAGNOSIS — I4811 Longstanding persistent atrial fibrillation: Secondary | ICD-10-CM | POA: Diagnosis not present

## 2023-12-01 DIAGNOSIS — M160 Bilateral primary osteoarthritis of hip: Secondary | ICD-10-CM | POA: Diagnosis not present

## 2023-12-01 DIAGNOSIS — E876 Hypokalemia: Secondary | ICD-10-CM | POA: Diagnosis not present

## 2023-12-01 DIAGNOSIS — Z79890 Hormone replacement therapy (postmenopausal): Secondary | ICD-10-CM | POA: Diagnosis not present

## 2023-12-01 DIAGNOSIS — M6259 Muscle wasting and atrophy, not elsewhere classified, multiple sites: Secondary | ICD-10-CM | POA: Diagnosis not present

## 2023-12-01 DIAGNOSIS — G9389 Other specified disorders of brain: Secondary | ICD-10-CM | POA: Diagnosis not present

## 2023-12-01 DIAGNOSIS — G894 Chronic pain syndrome: Secondary | ICD-10-CM | POA: Diagnosis present

## 2023-12-01 DIAGNOSIS — M25562 Pain in left knee: Secondary | ICD-10-CM | POA: Diagnosis present

## 2023-12-01 DIAGNOSIS — I6523 Occlusion and stenosis of bilateral carotid arteries: Secondary | ICD-10-CM | POA: Diagnosis not present

## 2023-12-01 DIAGNOSIS — M6281 Muscle weakness (generalized): Secondary | ICD-10-CM | POA: Diagnosis not present

## 2023-12-01 DIAGNOSIS — I2510 Atherosclerotic heart disease of native coronary artery without angina pectoris: Secondary | ICD-10-CM | POA: Diagnosis not present

## 2023-12-01 DIAGNOSIS — Z6833 Body mass index (BMI) 33.0-33.9, adult: Secondary | ICD-10-CM

## 2023-12-01 DIAGNOSIS — I4891 Unspecified atrial fibrillation: Secondary | ICD-10-CM | POA: Diagnosis present

## 2023-12-01 DIAGNOSIS — Z79891 Long term (current) use of opiate analgesic: Secondary | ICD-10-CM

## 2023-12-01 DIAGNOSIS — I672 Cerebral atherosclerosis: Secondary | ICD-10-CM | POA: Diagnosis not present

## 2023-12-01 DIAGNOSIS — M549 Dorsalgia, unspecified: Secondary | ICD-10-CM | POA: Diagnosis present

## 2023-12-01 DIAGNOSIS — M79605 Pain in left leg: Secondary | ICD-10-CM | POA: Diagnosis not present

## 2023-12-01 DIAGNOSIS — F32A Depression, unspecified: Secondary | ICD-10-CM | POA: Diagnosis not present

## 2023-12-01 DIAGNOSIS — G4700 Insomnia, unspecified: Secondary | ICD-10-CM | POA: Diagnosis not present

## 2023-12-01 DIAGNOSIS — I639 Cerebral infarction, unspecified: Secondary | ICD-10-CM | POA: Diagnosis not present

## 2023-12-01 DIAGNOSIS — E66811 Obesity, class 1: Secondary | ICD-10-CM | POA: Diagnosis not present

## 2023-12-01 DIAGNOSIS — R278 Other lack of coordination: Secondary | ICD-10-CM | POA: Diagnosis not present

## 2023-12-01 DIAGNOSIS — I10 Essential (primary) hypertension: Secondary | ICD-10-CM | POA: Diagnosis present

## 2023-12-01 DIAGNOSIS — R41841 Cognitive communication deficit: Secondary | ICD-10-CM | POA: Diagnosis not present

## 2023-12-01 DIAGNOSIS — E039 Hypothyroidism, unspecified: Secondary | ICD-10-CM | POA: Diagnosis present

## 2023-12-01 DIAGNOSIS — Z8249 Family history of ischemic heart disease and other diseases of the circulatory system: Secondary | ICD-10-CM

## 2023-12-01 DIAGNOSIS — F419 Anxiety disorder, unspecified: Secondary | ICD-10-CM | POA: Diagnosis present

## 2023-12-01 DIAGNOSIS — I25810 Atherosclerosis of coronary artery bypass graft(s) without angina pectoris: Secondary | ICD-10-CM | POA: Diagnosis not present

## 2023-12-01 DIAGNOSIS — R29706 NIHSS score 6: Secondary | ICD-10-CM | POA: Diagnosis present

## 2023-12-01 DIAGNOSIS — S0990XA Unspecified injury of head, initial encounter: Secondary | ICD-10-CM | POA: Diagnosis not present

## 2023-12-01 DIAGNOSIS — S3992XA Unspecified injury of lower back, initial encounter: Secondary | ICD-10-CM | POA: Diagnosis not present

## 2023-12-01 DIAGNOSIS — M25552 Pain in left hip: Secondary | ICD-10-CM | POA: Diagnosis not present

## 2023-12-01 DIAGNOSIS — I6389 Other cerebral infarction: Secondary | ICD-10-CM | POA: Diagnosis not present

## 2023-12-01 DIAGNOSIS — M47814 Spondylosis without myelopathy or radiculopathy, thoracic region: Secondary | ICD-10-CM | POA: Diagnosis not present

## 2023-12-01 DIAGNOSIS — M25572 Pain in left ankle and joints of left foot: Secondary | ICD-10-CM | POA: Diagnosis not present

## 2023-12-01 DIAGNOSIS — Y92019 Unspecified place in single-family (private) house as the place of occurrence of the external cause: Secondary | ICD-10-CM | POA: Diagnosis not present

## 2023-12-01 DIAGNOSIS — M11262 Other chondrocalcinosis, left knee: Secondary | ICD-10-CM | POA: Diagnosis not present

## 2023-12-01 DIAGNOSIS — Z803 Family history of malignant neoplasm of breast: Secondary | ICD-10-CM

## 2023-12-01 DIAGNOSIS — M25472 Effusion, left ankle: Secondary | ICD-10-CM | POA: Insufficient documentation

## 2023-12-01 DIAGNOSIS — Z8673 Personal history of transient ischemic attack (TIA), and cerebral infarction without residual deficits: Secondary | ICD-10-CM | POA: Diagnosis not present

## 2023-12-01 DIAGNOSIS — R519 Headache, unspecified: Secondary | ICD-10-CM | POA: Diagnosis not present

## 2023-12-01 LAB — URINALYSIS, ROUTINE W REFLEX MICROSCOPIC
Bacteria, UA: NONE SEEN
Bilirubin Urine: NEGATIVE
Glucose, UA: NEGATIVE mg/dL
Hgb urine dipstick: NEGATIVE
Ketones, ur: NEGATIVE mg/dL
Leukocytes,Ua: NEGATIVE
Nitrite: NEGATIVE
Protein, ur: 30 mg/dL — AB
Specific Gravity, Urine: 1.016 (ref 1.005–1.030)
pH: 7 (ref 5.0–8.0)

## 2023-12-01 LAB — BASIC METABOLIC PANEL
Anion gap: 9 (ref 5–15)
BUN: 11 mg/dL (ref 8–23)
CO2: 27 mmol/L (ref 22–32)
Calcium: 9.1 mg/dL (ref 8.9–10.3)
Chloride: 105 mmol/L (ref 98–111)
Creatinine, Ser: 0.8 mg/dL (ref 0.44–1.00)
GFR, Estimated: >60 mL/min (ref >60)
Glucose, Bld: 131 mg/dL — ABNORMAL HIGH (ref 70–99)
Potassium: 3.4 mmol/L — ABNORMAL LOW (ref 3.5–5.1)
Sodium: 141 mmol/L (ref 135–145)

## 2023-12-01 LAB — CBC WITH DIFFERENTIAL/PLATELET
Abs Immature Granulocytes: 0.03 10*3/uL (ref 0.00–0.07)
Basophils Absolute: 0 10*3/uL (ref 0.0–0.1)
Basophils Relative: 0 %
Eosinophils Absolute: 0.1 10*3/uL (ref 0.0–0.5)
Eosinophils Relative: 2 %
HCT: 37.4 % (ref 36.0–46.0)
Hemoglobin: 12.1 g/dL (ref 12.0–15.0)
Immature Granulocytes: 0 %
Lymphocytes Relative: 16 %
Lymphs Abs: 1.1 10*3/uL (ref 0.7–4.0)
MCH: 28.6 pg (ref 26.0–34.0)
MCHC: 32.4 g/dL (ref 30.0–36.0)
MCV: 88.4 fL (ref 80.0–100.0)
Monocytes Absolute: 0.8 10*3/uL (ref 0.1–1.0)
Monocytes Relative: 12 %
Neutro Abs: 4.8 10*3/uL (ref 1.7–7.7)
Neutrophils Relative %: 70 %
Platelets: 196 10*3/uL (ref 150–400)
RBC: 4.23 MIL/uL (ref 3.87–5.11)
RDW: 14.4 % (ref 11.5–15.5)
WBC: 6.8 10*3/uL (ref 4.0–10.5)
nRBC: 0 % (ref 0.0–0.2)

## 2023-12-01 MED ORDER — IOHEXOL 350 MG/ML SOLN
75.0000 mL | Freq: Once | INTRAVENOUS | Status: AC | PRN
  Administered 2023-12-01: 75 mL via INTRAVENOUS

## 2023-12-01 MED ORDER — SODIUM CHLORIDE 0.9 % IV SOLN: INTRAVENOUS | Status: DC

## 2023-12-01 MED ORDER — OXYCODONE HCL 5 MG PO TABS
5.0000 mg | ORAL_TABLET | Freq: Once | ORAL | Status: AC
  Administered 2023-12-01: 5 mg via ORAL
  Filled 2023-12-01: qty 1

## 2023-12-01 MED ORDER — ACETAMINOPHEN 160 MG/5ML PO SOLN: 650.0000 mg | ORAL | Status: DC | PRN

## 2023-12-01 MED ORDER — STROKE: EARLY STAGES OF RECOVERY BOOK: Freq: Once | Status: AC

## 2023-12-01 MED ORDER — ENOXAPARIN SODIUM 40 MG/0.4ML IJ SOSY
40.0000 mg | PREFILLED_SYRINGE | INTRAMUSCULAR | Status: DC
  Administered 2023-12-01 – 2023-12-06 (×6): 40 mg via SUBCUTANEOUS
  Filled 2023-12-01 (×6): qty 0.4

## 2023-12-01 MED ORDER — ACETAMINOPHEN 650 MG RE SUPP: 650.0000 mg | RECTAL | Status: DC | PRN

## 2023-12-01 MED ORDER — ASPIRIN 81 MG PO CHEW
324.0000 mg | CHEWABLE_TABLET | Freq: Once | ORAL | Status: AC
  Administered 2023-12-01: 324 mg via ORAL
  Filled 2023-12-01: qty 4

## 2023-12-01 MED ORDER — ACETAMINOPHEN 325 MG PO TABS: 650.0000 mg | ORAL_TABLET | ORAL | Status: DC | PRN

## 2023-12-01 MED ORDER — LABETALOL HCL 5 MG/ML IV SOLN
10.0000 mg | INTRAVENOUS | Status: DC | PRN
  Filled 2023-12-01: qty 4

## 2023-12-01 MED ORDER — LORAZEPAM 0.5 MG PO TABS
0.5000 mg | ORAL_TABLET | Freq: Once | ORAL | Status: AC
  Administered 2023-12-01: 0.5 mg via ORAL
  Filled 2023-12-01: qty 1

## 2023-12-01 NOTE — Assessment & Plan Note (Signed)
 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

## 2023-12-01 NOTE — ED Notes (Signed)
 Delayed on preforming the NIH Stroke scale due to patient being in CT

## 2023-12-01 NOTE — ED Notes (Signed)
 See triage notes. Patient c/o multiple falls over the last couple of days. Patient has an abrasion to the back of her head. Patient fell around 0100 and could not get up and called family. Family reports the patient leaning to the left. Patient is on blood thinners.

## 2023-12-01 NOTE — Assessment & Plan Note (Signed)
 Continue blood pressure control, antiplatelet therapy and statin.

## 2023-12-01 NOTE — ED Triage Notes (Addendum)
 Patient presents after having multiple falls over the last couple days.  Patient has abrasion to back of head.  Family reports she fell again about 0100 this am and couldn't get up and she called them.  Patient is leaning to the left per family. Reports SOB with is normal for her. Patient is on plavix and did hit her head.  Patient has mark to left eyebrow as well.  Patient does complain of headache during triage.

## 2023-12-01 NOTE — ED Notes (Signed)
 CCMD called and cardiac monitoring verified.

## 2023-12-01 NOTE — ED Provider Notes (Signed)
 [facility] Provider Note    Event Date/Time   First MD Initiated Contact with Patient 12/01/23 [phone]     (approximate)   History   Fall   HPI  Carmen [name] is a 83 y.o. female with a history of CAD, hypertension, stroke who presents after a fall and dizziness.  Per family and patient, she has been doing well the last few days, she has been cooking for herself, taking walks with family without difficulty.  Last night around 1:00 she got up to go to the bathroom felt dizzy and had a fall.  Family came to help and found that she could not stand because her left leg seemed weak.  She did hit her head and is on Plavix.     Physical Exam   Triage Vital Signs: ED Triage Vitals  Encounter Vitals Group     BP 12/01/23 0755 (!) 159/98     Systolic BP Percentile --      Diastolic BP Percentile --      Pulse Rate 12/01/23 0755 (!) 105     Resp 12/01/23 0755 18     Temp 12/01/23 0755 98.2 F (36.8 C)     Temp Source 12/01/23 0755 Oral     SpO2 12/01/23 0755 96 %     Weight 12/01/23 0754 76.2 kg (168 lb)     Height 12/01/23 0754 1.524 m (5')     Head Circumference --      Peak Flow --      Pain Score 12/01/23 0754 5     Pain Loc --      Pain Education --      Exclude from Growth Chart --     Most recent vital signs: Vitals:   12/01/23 1030 12/01/23 1100  BP: (!) 157/102 (!) 163/123  Pulse: 82 83  Resp: (!) 25 18  Temp:    SpO2: 96% 96%     General: Awake, no distress.  CV:  Good peripheral perfusion.  Resp:  Normal effort.  Abd:  No distention.  Soft, nontender Other:  No pain with axial load on both hips, no pain with internal and external rotation on the hips.  No pain with flexion at the hips and knees.  Normal range of motion of the upper extremities without pain.  Small hematoma to the posterior scalp, no laceration.  Small bruise just above the left eyebrow  Neurologically patient is able to move all extremities equally.  She is able to  hold her legs independently off the bed against gravity but reports that her left leg does not feel the same as her right leg.  Cranial nerves appear intact  Mild tenderness to palpation of the vertebrae in the upper back   ED Results / Procedures / Treatments   Labs (all labs ordered are listed, but only abnormal results are displayed) Labs Reviewed  BASIC METABOLIC PANEL - Abnormal; Notable for the following components:      Result Value   Potassium 3.4 (*)    Glucose, Bld 131 (*)    All other components within normal limits  URINALYSIS, ROUTINE W REFLEX MICROSCOPIC - Abnormal; Notable for the following components:   Color, Urine AMBER (*)    APPearance HAZY (*)    Protein, ur 30 (*)    All other components within normal limits  CBC WITH DIFFERENTIAL/PLATELET     EKG  ED ECG REPORT I, Jene Every, the attending physician, personally viewed and  interpreted this ECG.  Date: 12/01/2023  Rhythm: Atrial fibrillation QRS Axis: normal Intervals: Abnormal ST/T Wave abnormalities: normal Narrative Interpretation: no evidence of acute ischemia    RADIOLOGY CT head without evidence of acute abnormality    PROCEDURES:  Critical Care performed: yes  CRITICAL CARE Performed by: Jene Every   Total critical care time: 30 minutes  Critical care time was exclusive of separately billable procedures and treating other patients.  Critical care was necessary to treat or prevent imminent or life-threatening deterioration.  Critical care was time spent personally by me on the following activities: development of treatment plan with patient and/or surrogate as well as nursing, discussions with consultants, evaluation of patient's response to treatment, examination of patient, obtaining history from patient or surrogate, ordering and performing treatments and interventions, ordering and review of laboratory studies, ordering and review of radiographic studies, pulse oximetry  and re-evaluation of patient's condition.   Procedures   MEDICATIONS ORDERED IN ED: Medications  LORazepam (ATIVAN) tablet 0.5 mg (0.5 mg Oral Given 12/01/23 1050)     IMPRESSION / MDM / ASSESSMENT AND PLAN / ED COURSE  I reviewed the triage vital signs and the nursing notes. Patient's presentation is most consistent with acute presentation with potential threat to life or bodily function.  Patient presents after a fall with head injury, also with reports of potential left leg weakness.  No evidence of hip or knee injury on thorough exam.  Given report of dizziness followed by fall, differential includes CVA, vertigo, hypotension/dehydration  Will send for CT head cervical spine, x-rays of the upper back, obtain labs and reevaluate.  She is outside the window of TNK as this happened greater than 7 hours ago, LVO negative, high concern for CVA however  CT head and x-rays are reassuring, will send for MRI  ----------------------------------------- 1:03 PM on 12/01/2023 ----------------------------------------- Contacted by radiologist and notified of small acute CVA on MRI, discussed with patient and family, will consult the hospitalist for admission        FINAL CLINICAL IMPRESSION(S) / ED DIAGNOSES   Final diagnoses:  Cerebrovascular accident (CVA), unspecified mechanism (HCC)     Rx / DC Orders   ED Discharge Orders     None        Note:  This document was prepared using Dragon voice recognition software and may include unintentional dictation errors.   Jene Every, MD 12/01/23 1309

## 2023-12-01 NOTE — Assessment & Plan Note (Signed)
Continue blood pressure control with losartan and metoprolol.  ?

## 2023-12-01 NOTE — H&P (Addendum)
 History and Physical    Patient: Carmen [name] GEX:[id number] DOB: December 15, 1940 DOA: 12/01/2023 DOS: the patient was seen and examined on 12/01/2023 PCP: Mort Sawyers, FNP  Patient coming from: Home  Chief Complaint:  Chief Complaint  Patient presents with   Fall   HPI: Carmen [name] is a 83 y.o. female with medical history significant of atrial fibrillation status post watchman, hypertension, hyperlipidemia, CAD status post CABG 2005, history of CVA/TIA presenting with CVA.  History from patient as well as family.  Per report, patient with generalized weakness over the past 1 to 2 days.  Family has had difficulty ambulating patient.  Symptoms persisted over the course of this morning with predominant left-sided/left lower extremity weakness.  No slurred speech or confusion.  No chest pain or shortness of breath.  Patient currently lives at home with family in a separate housing roughly 10 feet away from main house with handicap bathroom.  + falls. No reported head trauma or LOC.  Currently on Plavix status post Watchman device in the setting of atrial fibrillation.  No diaphoresis noted. Presented to the ER afebrile, hemodynamically stable.  Satting well on room air.  White count 6.8, hemoglobin 12.1, platelets 196, creatinine 0.8, glucose 131.  Urinalysis stable.  CT head and CT C-spine grossly within normal limits.  MRI of the brain noted with left-sided punctate cingulate gyrus infarct. Review of Systems: As mentioned in the history of present illness. All other systems reviewed and are negative. Past Medical History:  Diagnosis Date   Abnormal CT scan, lumbar spine (05/11/2021) 05/17/2021   (05/11/2021) LUMBAR CT FINDINGS: Alignment: Lumbar levocurvature, apex L4. Mild lateral listhesis L4 on L5 of approximately 3 mm. Vertebrae: Remote appearing superior endplate deformities at T12 with 10% height loss and L1 with up to 20% height loss. Multilevel discogenic and facet degenerative changes. Mild  bilateral SI joint arthrosis.  DISC LEVELS: T11-T12: Near complete disc height loss with de   Abnormal MRI, cervical spine (05/12/2021) 05/17/2021   (05/12/2021) CERVICAL MRI FINDINGS: Motion artifact is present. Posterior Fossa, vertebral arteries, paraspinal tissues: Left superior cerebellar infarct.   DISC LEVELS: C2-C3: Disc bulge with endplate osteophytes. Uncovertebral and facet hypertrophy. C3-C4: Disc bulge with endplate osteophytes. Uncovertebral and facet hypertrophy. Mild canal stenosis. Marked foraminal stenosis. C4-C5: Disc bulge w   Allergy    Anemia    Anxiety    Arthritis    Back pain    Coronary artery disease    Depression    History of stroke 10/02/2020   Hypertension    Peripheral vascular disease (HCC)    Personal history of nicotine dependence 10/02/2020   Prsnl hx of TIA (TIA), and cereb infrc w/o resid deficits 10/02/2020   Stroke [facility])    Thyroid disease    Past Surgical History:  Procedure Laterality Date   back injections     CARDIAC CATHETERIZATION     CATARACT EXTRACTION     CORONARY ANGIOPLASTY     CORONARY ARTERY BYPASS GRAFT     2005   LEFT ATRIAL APPENDAGE OCCLUSION N/A 12/28/2022   Procedure: LEFT ATRIAL APPENDAGE OCCLUSION;  Surgeon: Lanier Prude, MD;  Location: [facility] INVASIVE CV LAB;  Service: Cardiovascular;  Laterality: N/A;   TEE WITHOUT CARDIOVERSION N/A 12/28/2022   Procedure: TRANSESOPHAGEAL ECHOCARDIOGRAM;  Surgeon: Lanier Prude, MD;  Location: [facility] INVASIVE CV LAB;  Service: Cardiovascular;  Laterality: N/A;   Social History:  reports that she quit smoking about 3 years ago. Her smoking use included cigarettes.  She started smoking about 33 years ago. She has never used smokeless tobacco. She reports that she does not currently use alcohol. She reports current drug use. Drug: Hydrocodone.  No Known Allergies  Family History  Problem Relation Age of Onset   Breast cancer Mother    Heart attack Father    Bipolar disorder Daughter     Diabetes Mellitus II Neg Hx     Prior to Admission medications   Medication Sig Start Date End Date Taking? Authorizing Provider  atorvastatin (LIPITOR) 40 MG tablet Take 1 tablet (40 mg total) by mouth at bedtime. 02/01/23  Yes Dugal, Wyatt Mage, FNP  Cholecalciferol (VITAMIN D-3) 25 MCG (1000 UT) CAPS Take 1,000 Units by mouth daily.   Yes [provider]  clopidogrel (PLAVIX) 75 MG tablet Take 1 tablet (75 mg total) by mouth daily. 06/28/23 06/27/24 Yes Georgie Chard D, NP  cyanocobalamin (VITAMIN B12) 500 MCG tablet Take 500 mcg by mouth daily.   Yes [provider]  diphenhydrAMINE (BENADRYL) 25 MG tablet Take 25 mg by mouth at bedtime as needed for allergies.   Yes [provider]  divalproex (DEPAKOTE) 500 MG DR tablet Take 1 tablet (500 mg total) by mouth at bedtime. 02/27/23  Yes Mort Sawyers, FNP  famotidine (PEPCID AC) 10 MG tablet Take 10 mg by mouth daily as needed for heartburn or indigestion.   Yes [provider]  furosemide (LASIX) 20 MG tablet Take 1 tablet (20 mg total) by mouth daily as needed (for swelling). 09/27/23  Yes Fountain, Madison L, NP  HYDROcodone-acetaminophen (NORCO/VICODIN) 5-325 MG tablet Take 1 tablet by mouth 2 (two) times daily as needed for severe pain (pain score 7-10). Must last 30 days. 11/17/23 12/17/23 Yes Delano Metz, MD  hydroxychloroquine (PLAQUENIL) 200 MG tablet Take 200 mg by mouth 2 (two) times daily.   Yes [provider]  levothyroxine (SYNTHROID) 137 MCG tablet Take 1 tablet (137 mcg total) by mouth daily before breakfast. 02/27/23  Yes Dugal, Tabitha, FNP  loratadine (CLARITIN) 10 MG tablet Take 1 tablet (10 mg total) by mouth daily. 02/21/23  Yes Dugal, Wyatt Mage, FNP  losartan (COZAAR) 25 MG tablet Take 1/2 tablet once daily 02/21/23  Yes Dugal, Tabitha, FNP  magnesium oxide (MAG-OX) 400 (240 Mg) MG tablet Take 400 mg by mouth daily.   Yes [provider]  metoprolol succinate (TOPROL-XL) 100  MG 24 hr tablet Take 0.5 tablets (50 mg total) by mouth daily. Take with or immediately following a meal. 09/27/23  Yes Fountain, Madison L, NP  naloxone [facility]) nasal spray 4 mg/0.1 mL Place 1 spray into the nose as needed for up to 365 doses (for opioid-induced respiratory depresssion). In case of emergency (overdose), spray once into each nostril. If no response within 3 minutes, repeat application and call 911. 10/18/23 10/15/24 Yes Delano Metz, MD  pantoprazole (PROTONIX) 40 MG tablet Take 1 tablet (40 mg total) by mouth 2 (two) times daily. 11/09/23  Yes Doreene Nest, NP  traZODone (DESYREL) 100 MG tablet TAKE 1 TABLET BY MOUTH AT BEDTIME AS NEEDED FOR SLEEP 04/12/23  Yes Dugal, Tabitha, FNP  CALCIUM PO Take 1 tablet by mouth daily.    [provider]  HYDROcodone-acetaminophen (NORCO/VICODIN) 5-325 MG tablet Take 1 tablet by mouth 2 (two) times daily as needed for severe pain (pain score 7-10). Must last 30 days. 10/18/23 11/17/23  Delano Metz, MD  HYDROcodone-acetaminophen (NORCO/VICODIN) 5-325 MG tablet Take 1 tablet by mouth 2 (two) times daily  as needed for severe pain (pain score 7-10). Must last 30 days. 12/17/23 01/16/24  Delano Metz, MD    Physical Exam: Vitals:   12/01/23 0940 12/01/23 1000 12/01/23 1030 12/01/23 1100  BP: (!) 158/83 (!) 158/82 (!) 157/102 (!) 163/123  Pulse: 87 85 82 83  Resp: (!) 22 (!) 21 (!) 25 18  Temp:      TempSrc:      SpO2: 96% 100% 96% 96%  Weight:      Height:       Physical Exam Constitutional:      Appearance: She is obese.  HENT:     Head: Normocephalic and atraumatic.     Nose: Nose normal.     Mouth/Throat:     Mouth: Mucous membranes are dry.  Eyes:     Pupils: Pupils are equal, round, and reactive to light.  Cardiovascular:     Rate and Rhythm: Normal rate and regular rhythm.  Pulmonary:     Effort: Pulmonary effort is normal.  Abdominal:     General: Bowel sounds are normal.  Musculoskeletal:         General: Normal range of motion.  Skin:    General: Skin is warm.  Neurological:     General: No focal deficit present.  Psychiatric:        Mood and Affect: Mood normal.     Data Reviewed:  There are no new results to review at this time.  MR BRAIN WO CONTRAST CLINICAL DATA:  Multiple falls over the last several days. Abrasions to back of head. Headache. Patient fell at 1 a.m. and was unable to get back up.  EXAM: MRI HEAD WITHOUT CONTRAST  TECHNIQUE: Multiplanar, multiecho pulse sequences of the brain and surrounding structures were obtained without intravenous contrast.  COMPARISON:  CT head without contrast 12/01/2023. MR head without contrast 03/15/2023.  FINDINGS: Brain: Punctate foci of restricted diffusion are noted along the margin of the previous right ACA territory infarct, involving the right cingulate gyrus. No acute hemorrhage or mass lesion is present. Is chronic encephalomalacia anteriorly in the right frontal lobe is stable. Confluent periventricular T2 hyperintensities are stable. The ventricles are proportionate to the degree of atrophy. Ventricular size is stable. No significant extraaxial fluid collection is present.  A remote left cerebellar infarct is stable. Remote lacunar infarct is noted in the right cerebellum. The brainstem is within normal limits. The internal auditory canals are within normal limits. Midline structures are within normal limits.  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 The right internal carotid artery is patent. Posterior circulation is patent.  Skull and upper cervical spine: The craniocervical junction is normal. Upper cervical spine is within normal limits. Marrow signal is unremarkable.  Sinuses/Orbits: The paranasal sinuses and mastoid air cells are clear. Bilateral lens replacements are noted. Globes and orbits are otherwise unremarkable.  IMPRESSION: 1. Punctate foci of  restricted diffusion along the margin of the previous right ACA territory infarct, involving the right cingulate gyrus, consistent with a small acute infarct. 2. Stable chronic encephalomalacia anteriorly in the right frontal lobe. 3. Stable chronic left cerebellar infarct. 4. Stable remote lacunar infarct in the right cerebellum. 5. 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 6. Stable atrophy and white matter disease. This likely reflects the sequela of chronic microvascular ischemia.  Electronically Signed   By: Marin Roberts M.D.   On: 12/01/2023 12:44 DG Lumbar Spine 2-3 Views CLINICAL DATA:  Fall.  Back injury.  EXAM: THORACIC SPINE 2 VIEWS; LUMBAR SPINE - 2-3 VIEW  COMPARISON:  CT lumbar spine 05/11/2021. Radiographs of the thoracolumbar spine 03/14/2021.  FINDINGS: There are 12 rib-bearing thoracic type vertebral bodies. There is a stable convex right lower thoracic scoliosis with normal lateral alignment. No evidence of acute fracture, paraspinal hematoma or widening of the interpedicular distance. Multilevel spondylosis throughout the cervicothoracic spine, similar to prior radiographs.  There are 5 lumbar type vertebral bodies. Stable mild chronic compression deformity at L1. No evidence of acute fracture or pars defect. The lumbar disc spaces are relatively preserved. There is mild multilevel facet arthropathy. Aortoiliac atherosclerosis noted.  IMPRESSION: 1. No evidence of acute thoracic or lumbar spine fracture or traumatic malalignment. 2. Stable mild chronic compression deformity at L1. 3. Stable scoliosis and multilevel spondylosis.  Electronically Signed   By: Carey Bullocks M.D.   On: 12/01/2023 10:13 DG Thoracic Spine 2 View CLINICAL DATA:  Fall.  Back injury.  EXAM: THORACIC SPINE 2 VIEWS; LUMBAR SPINE - 2-3 VIEW  COMPARISON:  CT lumbar spine 05/11/2021. Radiographs of the thoracolumbar spine  03/14/2021.  FINDINGS: There are 12 rib-bearing thoracic type vertebral bodies. There is a stable convex right lower thoracic scoliosis with normal lateral alignment. No evidence of acute fracture, paraspinal hematoma or widening of the interpedicular distance. Multilevel spondylosis throughout the cervicothoracic spine, similar to prior radiographs.  There are 5 lumbar type vertebral bodies. Stable mild chronic compression deformity at L1. No evidence of acute fracture or pars defect. The lumbar disc spaces are relatively preserved. There is mild multilevel facet arthropathy. Aortoiliac atherosclerosis noted.  IMPRESSION: 1. No evidence of acute thoracic or lumbar spine fracture or traumatic malalignment. 2. Stable mild chronic compression deformity at L1. 3. Stable scoliosis and multilevel spondylosis.  Electronically Signed   By: Carey Bullocks M.D.   On: 12/01/2023 10:13 CT Cervical Spine Wo Contrast CLINICAL DATA:  Mcbrien head trauma.  Neck trauma.  EXAM: CT HEAD WITHOUT CONTRAST  CT CERVICAL SPINE WITHOUT CONTRAST  TECHNIQUE: Multidetector CT imaging of the head and cervical spine was performed following the standard protocol without intravenous contrast. Multiplanar CT image reconstructions of the cervical spine were also generated.  RADIATION DOSE REDUCTION: This exam was performed according to the departmental dose-optimization program which includes automated exposure control, adjustment of the mA and/or kV according to patient size and/or use of iterative reconstruction technique.  COMPARISON:  08/18/2022  FINDINGS: CT HEAD FINDINGS  Brain: No evidence of acute infarction, hemorrhage, hydrocephalus, extra-axial collection or mass lesion/mass effect. 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 Ischemic gliosis in the  cerebral white matter.  Vascular: No hyperdense vessel or unexpected calcification.  Skull: Normal. Negative for fracture or focal lesion.  Sinuses/Orbits: No evidence of injury  CT CERVICAL SPINE FINDINGS  Alignment: Normal.  Skull base and vertebrae: No acute fracture. No primary bone lesion or focal pathologic process.  Soft tissues and spinal canal: No prevertebral fluid or swelling. No visible canal hematoma.  Disc levels:  Generalized degenerative endplate and facet spurring.  Upper chest: Negative  IMPRESSION: No evidence of acute intracranial or cervical spine injury.  Electronically Signed   By: Tiburcio Pea M.D.   On: 12/01/2023 09:09 CT Head Wo Contrast CLINICAL DATA:  [place] head trauma.  Neck trauma.  EXAM: CT HEAD WITHOUT CONTRAST  CT CERVICAL SPINE WITHOUT CONTRAST  TECHNIQUE: Multidetector CT imaging of  the head and cervical spine was performed following the standard protocol without intravenous contrast. Multiplanar CT image reconstructions of the cervical spine were also generated.  RADIATION DOSE REDUCTION: This exam was performed according to the departmental dose-optimization program which includes automated exposure control, adjustment of the mA and/or kV according to patient size and/or use of iterative reconstruction technique.  COMPARISON:  08/18/2022  FINDINGS: CT HEAD FINDINGS  Brain: No evidence of acute infarction, hemorrhage, hydrocephalus, extra-axial collection or mass lesion/mass effect. 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 Ischemic gliosis in the cerebral white matter.  Vascular: No hyperdense vessel or unexpected calcification.  Skull: Normal. Negative for fracture or focal lesion.  Sinuses/Orbits: No evidence of injury  CT CERVICAL SPINE FINDINGS  Alignment:  Normal.  Skull base and vertebrae: No acute fracture. No primary bone lesion or focal pathologic process.  Soft tissues and spinal canal: No prevertebral fluid or swelling. No visible canal hematoma.  Disc levels:  Generalized degenerative endplate and facet spurring.  Upper chest: Negative  IMPRESSION: No evidence of acute intracranial or cervical spine injury.  Electronically Signed   By: Tiburcio Pea M.D.   On: 12/01/2023 09:09  Lab Results  Component Value Date   WBC 6.8 12/01/2023   HGB 12.1 12/01/2023   HCT 37.4 12/01/2023   MCV 88.4 12/01/2023   PLT 196 12/01/2023   Last metabolic panel Lab Results  Component Value Date   GLUCOSE 131 (H) 12/01/2023   NA 141 12/01/2023   K 3.4 (L) 12/01/2023   CL 105 12/01/2023   CO2 27 12/01/2023   BUN 11 12/01/2023   CREATININE 0.80 12/01/2023   GFRNONAA >60 12/01/2023   CALCIUM 9.1 12/01/2023   PROT 6.3 (L) 10/14/2022   ALBUMIN 3.8 10/14/2022   LABGLOB 2.5 11/22/2020   AGRATIO 1.7 11/22/2020   BILITOT 0.7 10/14/2022   ALKPHOS 39 10/14/2022   AST 30 10/14/2022   ALT 26 10/14/2022   ANIONGAP 9 12/01/2023    Assessment and Plan: CVA (cerebral vascular accident) (HCC) Recurrent falls over the last 2 days with noted persistent left-sided weakness Noted punctate right cingulate gyrus infarct on imaging Currently on Plavix Will plan for formal CVA evaluation including CTA head neck, 2D echo, risk stratification labs PT OT evaluation Fall precaution Neurology consultation as clinically appropriate  Coronary artery disease involving coronary bypass graft of native heart without angina pectoris CAD s/p CABG 2005  No active CP  EKG stable  Follow    Essential hypertension Allow for permissive HTN in setting of CVA eval  Prn IV labetalol for SBP>220 or DBP>110    Atrial fibrillation (HCC) Rate controlled On Plavix Status post Watchman device placement per family   Insomnia trazodone  Peripheral vascular  disease (HCC) Cont plavix  Statin       Advance Care Planning:   Code Status: Full Code   Consults: Neurology   Family Communication: Family at the bedside   Severity of Illness: The appropriate patient status for this patient is OBSERVATION. Observation status is judged to be reasonable and necessary in order to provide the required intensity of service to ensure the patient's safety. The patient's presenting symptoms, physical exam findings, and initial radiographic and laboratory data in the context of their medical condition is felt to place them at decreased risk for further clinical deterioration. Furthermore, it is anticipated that the patient will be medically  stable for discharge from the hospital within 2 midnights of admission.   Author: Floydene Flock, MD 12/01/2023 3:02 PM  For on call review [email or web address].

## 2023-12-01 NOTE — ED Notes (Signed)
 Pt returned from MRI

## 2023-12-01 NOTE — Assessment & Plan Note (Signed)
 CAD s/p CABG 2005  Patient with no chest pain, no acute coronary syndrome.

## 2023-12-01 NOTE — ED Notes (Signed)
 Patient called out needing to use the restroom. Patient and patient's family stated the patient is unable to ambulate to the restroom currently. Patient assisted to roll and perch on a bed pan. Patient successful in urinating. Patient's family member assisted moving the patient up in the bed with the use of bed pads. Urine sample collected and sent to the lab.

## 2023-12-01 NOTE — Assessment & Plan Note (Signed)
 Cont plaquenil

## 2023-12-01 NOTE — Assessment & Plan Note (Signed)
 trazodone ?

## 2023-12-01 NOTE — Assessment & Plan Note (Signed)
 Status post Watchman procedure.  Plan to continue dual antiplatelet therapy with aspirin and clopidogrel.  Rate control with metoprolol.  Continue telemetry montoring.

## 2023-12-02 ENCOUNTER — Observation Stay
Admit: 2023-12-02 | Discharge: 2023-12-02 | Disposition: A | Discharge status: Home / Self Care | Attending: Family Medicine | Admitting: Family Medicine

## 2023-12-02 ENCOUNTER — Observation Stay

## 2023-12-02 DIAGNOSIS — G894 Chronic pain syndrome: Secondary | ICD-10-CM

## 2023-12-02 DIAGNOSIS — I639 Cerebral infarction, unspecified: Secondary | ICD-10-CM | POA: Diagnosis not present

## 2023-12-02 DIAGNOSIS — I4811 Longstanding persistent atrial fibrillation: Secondary | ICD-10-CM | POA: Diagnosis not present

## 2023-12-02 DIAGNOSIS — M069 Rheumatoid arthritis, unspecified: Secondary | ICD-10-CM

## 2023-12-02 DIAGNOSIS — I25810 Atherosclerosis of coronary artery bypass graft(s) without angina pectoris: Secondary | ICD-10-CM

## 2023-12-02 DIAGNOSIS — E876 Hypokalemia: Secondary | ICD-10-CM

## 2023-12-02 DIAGNOSIS — M160 Bilateral primary osteoarthritis of hip: Secondary | ICD-10-CM | POA: Diagnosis not present

## 2023-12-02 DIAGNOSIS — I739 Peripheral vascular disease, unspecified: Secondary | ICD-10-CM

## 2023-12-02 DIAGNOSIS — M11262 Other chondrocalcinosis, left knee: Secondary | ICD-10-CM | POA: Diagnosis not present

## 2023-12-02 DIAGNOSIS — M25572 Pain in left ankle and joints of left foot: Secondary | ICD-10-CM | POA: Diagnosis not present

## 2023-12-02 DIAGNOSIS — M25552 Pain in left hip: Secondary | ICD-10-CM | POA: Diagnosis not present

## 2023-12-02 DIAGNOSIS — I10 Essential (primary) hypertension: Secondary | ICD-10-CM | POA: Diagnosis not present

## 2023-12-02 DIAGNOSIS — M25562 Pain in left knee: Secondary | ICD-10-CM | POA: Diagnosis not present

## 2023-12-02 DIAGNOSIS — E66811 Obesity, class 1: Secondary | ICD-10-CM

## 2023-12-02 DIAGNOSIS — M79605 Pain in left leg: Secondary | ICD-10-CM | POA: Diagnosis not present

## 2023-12-02 LAB — LIPID PANEL
Cholesterol: 115 mg/dL (ref 0–200)
HDL: 48 mg/dL (ref >40)
LDL Cholesterol: 47 mg/dL (ref 0–99)
Total CHOL/HDL Ratio: 2.4 ratio
Triglycerides: 100 mg/dL (ref <150)
VLDL: 20 mg/dL (ref 0–40)

## 2023-12-02 MED ORDER — METOPROLOL SUCCINATE ER 50 MG PO TB24
50.0000 mg | ORAL_TABLET | Freq: Every day | ORAL | Status: DC
  Administered 2023-12-02 – 2023-12-07 (×6): 50 mg via ORAL
  Filled 2023-12-02 (×6): qty 1

## 2023-12-02 MED ORDER — CLOPIDOGREL BISULFATE 75 MG PO TABS
75.0000 mg | ORAL_TABLET | Freq: Every day | ORAL | Status: DC
  Administered 2023-12-02 – 2023-12-07 (×6): 75 mg via ORAL
  Filled 2023-12-02 (×6): qty 1

## 2023-12-02 MED ORDER — ASPIRIN 81 MG PO TBEC
81.0000 mg | DELAYED_RELEASE_TABLET | Freq: Every day | ORAL | Status: DC
  Administered 2023-12-02 – 2023-12-07 (×6): 81 mg via ORAL
  Filled 2023-12-02 (×6): qty 1

## 2023-12-02 MED ORDER — LORATADINE 10 MG PO TABS
10.0000 mg | ORAL_TABLET | Freq: Every day | ORAL | Status: DC
  Administered 2023-12-02 – 2023-12-07 (×6): 10 mg via ORAL
  Filled 2023-12-02 (×6): qty 1

## 2023-12-02 MED ORDER — ACETAMINOPHEN 500 MG PO TABS
1000.0000 mg | ORAL_TABLET | Freq: Once | ORAL | Status: AC
  Administered 2023-12-02: 1000 mg via ORAL
  Filled 2023-12-02: qty 2

## 2023-12-02 MED ORDER — LOSARTAN POTASSIUM 25 MG PO TABS
12.5000 mg | ORAL_TABLET | Freq: Every day | ORAL | Status: DC
  Administered 2023-12-02 – 2023-12-07 (×6): 12.5 mg via ORAL
  Filled 2023-12-02 (×6): qty 1

## 2023-12-02 MED ORDER — ATORVASTATIN CALCIUM 20 MG PO TABS
40.0000 mg | ORAL_TABLET | Freq: Every day | ORAL | Status: DC
  Administered 2023-12-02 – 2023-12-06 (×5): 40 mg via ORAL
  Filled 2023-12-02 (×5): qty 2

## 2023-12-02 MED ORDER — OXYCODONE HCL 5 MG PO TABS: 5.0000 mg | ORAL_TABLET | Freq: Two times a day (BID) | ORAL | Status: DC | PRN

## 2023-12-02 MED ORDER — HYDROXYCHLOROQUINE SULFATE 200 MG PO TABS
200.0000 mg | ORAL_TABLET | Freq: Two times a day (BID) | ORAL | Status: DC
  Administered 2023-12-02 – 2023-12-07 (×10): 200 mg via ORAL
  Filled 2023-12-02 (×10): qty 1

## 2023-12-02 MED ORDER — LEVOTHYROXINE SODIUM 137 MCG PO TABS
137.0000 ug | ORAL_TABLET | Freq: Every day | ORAL | Status: DC
  Administered 2023-12-03 – 2023-12-07 (×5): 137 ug via ORAL
  Filled 2023-12-02 (×5): qty 1

## 2023-12-02 MED ORDER — CYANOCOBALAMIN 500 MCG PO TABS
500.0000 ug | ORAL_TABLET | Freq: Every day | ORAL | Status: DC
  Administered 2023-12-03 – 2023-12-07 (×5): 500 ug via ORAL
  Filled 2023-12-02 (×5): qty 1

## 2023-12-02 MED ORDER — OXYCODONE HCL 5 MG PO TABS
5.0000 mg | ORAL_TABLET | Freq: Four times a day (QID) | ORAL | Status: DC | PRN
  Administered 2023-12-02 – 2023-12-04 (×5): 5 mg via ORAL
  Filled 2023-12-02 (×5): qty 1

## 2023-12-02 MED ORDER — PANTOPRAZOLE SODIUM 40 MG PO TBEC
40.0000 mg | DELAYED_RELEASE_TABLET | Freq: Two times a day (BID) | ORAL | Status: DC
  Administered 2023-12-02 – 2023-12-07 (×10): 40 mg via ORAL
  Filled 2023-12-02 (×10): qty 1

## 2023-12-02 MED ORDER — DIVALPROEX SODIUM 500 MG PO DR TAB
500.0000 mg | DELAYED_RELEASE_TABLET | Freq: Every day | ORAL | Status: DC
  Administered 2023-12-02 – 2023-12-06 (×5): 500 mg via ORAL
  Filled 2023-12-02 (×5): qty 1

## 2023-12-02 MED ORDER — ACETAMINOPHEN 325 MG PO TABS
650.0000 mg | ORAL_TABLET | Freq: Four times a day (QID) | ORAL | Status: DC | PRN
  Administered 2023-12-03 – 2023-12-07 (×2): 650 mg via ORAL
  Filled 2023-12-02 (×3): qty 2

## 2023-12-02 MED ORDER — TRAZODONE HCL 50 MG PO TABS: 100.0000 mg | ORAL_TABLET | Freq: Every evening | ORAL | Status: DC | PRN

## 2023-12-02 MED ORDER — POTASSIUM CHLORIDE CRYS ER 20 MEQ PO TBCR
40.0000 meq | EXTENDED_RELEASE_TABLET | Freq: Once | ORAL | Status: AC
  Administered 2023-12-02: 40 meq via ORAL
  Filled 2023-12-02: qty 2

## 2023-12-02 NOTE — Assessment & Plan Note (Signed)
 Continue with levothyroxine

## 2023-12-02 NOTE — Progress Notes (Signed)
 SLP Cancellation Note  Patient Details Name: Carmen [name] MRN: [id number] DOB: 07-20-1941   Cancelled treatment:       Reason Eval/Treat Not Completed: SLP screened, no needs identified, will sign off  Pt interviewed, no acute needs identified at this time.   Saud Bail 12/02/2023, 11:37 AM

## 2023-12-02 NOTE — Assessment & Plan Note (Addendum)
 Resume pain control with oxycodone.  Continue divalproex PT and OT   Follow up on left lower extremity radiographs.

## 2023-12-02 NOTE — Progress Notes (Signed)
 OT Cancellation Note  Patient Details Name: Carmen [name] MRN: [id number] DOB: 1940-10-14   Cancelled Treatment:    Reason Eval/Treat Not Completed: Other (comment). Pt pending LLE imaging to r/o bony injury. Will hold OT evaluation at this time and re-attempt at later date/time pending imaging results and updated plan of care.   Arman Filter., MPH, MS, OTR/L ascom [phone] 12/02/23, 10:04 AM

## 2023-12-02 NOTE — Assessment & Plan Note (Signed)
 Will add 40 kcl po and will follow up on renal function and electrolytes.  Serum cr has been stable on admission at [facility]

## 2023-12-02 NOTE — Assessment & Plan Note (Signed)
 Calculated BMI is 33.6

## 2023-12-02 NOTE — Evaluation (Signed)
 Physical Therapy Evaluation Patient Details Name: Carmen [name] MRN: [id number] DOB: 24-Sep-1941 Today's Date: 12/02/2023  History of Present Illness  presented to ER secondary to L-sided weakness; admitted for TIA/CVA work-up.  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  Endorses significant pain to L LE, ankle > hip.  L LE strength (grossly 3-/5) and ROM generally limited by pain; does endorse tenderness to palpation over lateral apsect of L ankle. Currently requiring mod assist for sit/stand and standing balance with RW; extensive cuing for hand placement and foot placement (to minimize WBing L LE). Does achieve upright stance, but unable to bring L LE into neutral position and bear functional amount of weight.  Does buckle with attempts at stepping or repositioning.  Additional mobility attempts deferred as result.  MD informed/aware; may benefit from imaging to rule out bony injury to L LE. Would benefit from skilled PT to address above deficits and promote optimal return to PLOF.; recommend post-acute PT follow up as indicated by interdisciplinary care team.          If plan is discharge home, recommend the following: A lot of help with walking and/or transfers;A lot of help with bathing/dressing/bathroom   Can travel by private vehicle   No    Equipment Recommendations Rolling walker (2 wheels);BSC/3in1  Recommendations for Other Services       Functional Status Assessment Patient has had a recent decline in their functional status and demonstrates the ability to make significant improvements in function in a reasonable and predictable amount of time.     Precautions / Restrictions Precautions Precautions: Fall Restrictions Weight Bearing Restrictions Per Provider Order: No      Mobility  Bed Mobility                General bed mobility comments: seated in recliner beginning/end of treatment session    Transfers Overall transfer level: Needs assistance Equipment used: Rolling walker (2 wheels) Transfers: Sit to/from Stand Sit to Stand: Mod assist           General transfer comment: cuing for hand placement; encouraged for R LE as primary WBing mechanism.  Poor tolerance for L LE WBing due to pain    Ambulation/Gait               General Gait Details: unable to tolerate to L LE pain  Stairs            Wheelchair Mobility     Tilt Bed    Modified Rankin (Stroke Patients Only)       Balance Overall balance assessment: Needs assistance Sitting-balance support: Feet supported, No upper extremity supported Sitting balance-[name]: Good     Standing balance support: Bilateral upper extremity supported Standing balance-[name]: Fair                               Pertinent Vitals/Pain Pain Assessment Pain Assessment: Faces Pain Score: 7  Pain Location: L ankle Pain Descriptors / Indicators: Aching, Grimacing, Guarding Pain Intervention(s): Limited activity within patient's tolerance, Monitored during session, Repositioned    Home Living Family/patient expects to be discharged to:: Skilled nursing facility Living Arrangements: Alone                 Additional Comments: Lives in in-law suite at daughter's  home; single-level, ramp access    Prior Function Prior Level of Function : Independent/Modified Independent             Mobility Comments: Intermittent use of RW in home environment; 4WRW for community distances.  Does endorse 2 falls immediately prior to admission. ADLs Comments: Indep with ADLs; daughter assists wth transportation and household chores as needed     Extremity/Trunk Assessment   Upper Extremity Assessment Upper Extremity Assessment: Overall WFL for tasks assessed    Lower Extremity Assessment Lower  Extremity Assessment:  (L LE grossly 3-/5 throughout, limited by pain; denies sensory deficit)       Communication        Cognition Arousal: Alert Behavior During Therapy: WFL for tasks assessed/performed, Anxious   PT - Cognitive impairments: No apparent impairments                                 Cueing       General Comments      Exercises     Assessment/Plan    PT Assessment Patient needs continued PT services  PT Problem List Decreased strength;Decreased range of motion;Decreased activity tolerance;Decreased balance;Decreased mobility;Decreased coordination;Decreased cognition;Decreased knowledge of use of DME;Decreased safety awareness;Decreased knowledge of precautions;Pain       PT Treatment Interventions DME instruction;Gait training;Stair training;Functional mobility training;Therapeutic activities;Therapeutic exercise;Balance training;Cognitive remediation;Patient/family education    PT Goals (Current goals can be found in the Care Plan section)  Acute Rehab PT Goals Patient Stated Goal: to return home PT Goal Formulation: With patient Time For Goal Achievement: 12/16/23 Potential to Achieve Goals: Good    Frequency Min 1X/week     Co-evaluation               AM-PAC PT "6 Clicks" Mobility  Outcome Measure Help needed turning from your back to your side while in a flat bed without using bedrails?: None Help needed moving from lying on your back to sitting on the side of a flat bed without using bedrails?: A Little Help needed moving to and from a bed to a chair (including a wheelchair)?: A Lot Help needed standing up from a chair using your arms (e.g., wheelchair or bedside chair)?: A Lot Help needed to walk in hospital room?: Total Help needed climbing 3-5 steps with a railing? : Total 6 Click Score: 13    End of Session   Activity Tolerance: Patient tolerated treatment well Patient left: in chair;with call bell/phone within  reach;with chair alarm set;with family/visitor present Nurse Communication: Mobility status PT Visit Diagnosis: Muscle weakness (generalized) (M62.81);Difficulty in walking, not elsewhere classified (R26.2)    Time: 1610-9604 PT Time Calculation (min) (ACUTE ONLY): 28 min   Charges:   PT Evaluation $PT Eval Moderate Complexity: 1 Mod   PT General Charges $$ ACUTE PT VISIT: 1 Visit        Kenitra Leventhal H. Manson Passey, PT, DPT, NCS 12/02/23, 10:16 AM [phone]

## 2023-12-02 NOTE — Progress Notes (Signed)
  Echocardiogram 2D Echocardiogram has NOT been performed. Two attempts were made to complete the patient's echo, the patient was off the floor both times, last attempt the patient log book stated she was in Xray. The echo will likely be completed tomorrow 12/03/23, thanks.  Lenor Coffin 12/02/2023, 10:38 AM

## 2023-12-02 NOTE — Hospital Course (Addendum)
 Carmen [name] was admitted to the hospital with the working diagnosis of acute CVA.   83 yo female with the past medical history of atrial fibrillation, hypertension, hyperlipidemia, coronary artery disease, and history of CVA who presented with a mechanical fall. Patient was noted to have difficulty ambulating, requiring more assistance than usual for the last 2 days. She was noted to have positive weakness, predominantly on the left side, resulting in multiple mechanical falls. On her initial physical examination her blood pressure was 158/83, HR 87 RR 21 and 02 saturation 96%, lungs with no wheezing or rales, heart with S1 and S2 present and regular, abdomen with no distention and no lower extremity edema. Neurologically not focal.   Na 141, K 3,4 CL 105 bicarbonate 27, glucose 121 bun 11 cr 0,80  Wbc 6,8 hgb 12.2 plt 196  Urine analysis with SG 1,016, protein 30, leukocytes negative, hgb negative   EKG 105 bpm, right axis deviation, normal intervals, qtc 488, atrial fibrillation rhythm with no significant ST segment or T wave changes.   CT head and neck with no acute intracranial changes or cervical spine injury.  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  Sable moderate narrowing of the supraclinoid left ICA.  Stable atherosclerotic changed at the right carotid bifurcation and right cavernous internal carotid artery without significant stenosis.  Mild narrowing of distal branch vessels without a significant proximal stenosis or occlusion within the ACA or MCA branches.

## 2023-12-02 NOTE — Plan of Care (Signed)
 Problem: Ischemic Stroke/TIA Tissue Perfusion: Goal: Complications of ischemic stroke/TIA will be minimized Outcome: Progressing  Problem: Education: Goal: Knowledge of disease or condition will improve Outcome: Progressing Goal: Knowledge of secondary prevention will improve  Outcome: Progressing Goal: Knowledge of patient specific risk factors will improve  Outcome: Progressing  Problem: Coping: Goal: Will verbalize positive feelings about self Outcome: Progressing Goal: Will identify appropriate support needs Outcome: Progressing   Problem: Health Behavior/Discharge Planning: Goal: Ability to manage health-related needs will improve Outcome: Progressing Goal: Goals will be collaboratively established with patient/family Outcome: Progressing  Problem: Self-Care: Goal: Ability to participate in self-care as condition permits will improve Outcome: Progressing Goal: Verbalization of feelings and concerns over difficulty with self-care will improve Outcome: Progressing Goal: Ability to communicate needs accurately will improve Outcome: Progressing  Problem: Nutrition: Goal: Risk of aspiration will decrease Outcome: Progressing Goal: Dietary intake will improve Outcome: Progressing

## 2023-12-02 NOTE — Progress Notes (Addendum)
 Progress Note   Patient: Carmen [name] ZOX:[id number] DOB: 06-14-1941 DOA: 12/01/2023     0 DOS: the patient was seen and examined on 12/02/2023   Brief hospital course: Carmen [name] was admitted to the hospital with the working diagnosis of acute CVA.   83 yo female with the past medical history of atrial fibrillation, hypertension, hyperlipidemia, coronary artery disease, and history of CVA who presented with a mechanical fall. Patient was noted to have difficulty ambulating, requiring more assistance than usual for the last 2 days. She was noted to have positive weakness, predominantly on the left side, resulting in multiple mechanical falls. On her initial physical examination her blood pressure was 158/83, HR 87 RR 21 and 02 saturation 96%, lungs with no wheezing or rales, heart with S1 and S2 present and regular, abdomen with no distention and no lower extremity edema. Neurologically not focal.   Na 141, K 3,4 CL 105 bicarbonate 27, glucose 121 bun 11 cr 0,80  Wbc 6,8 hgb 12.2 plt 196  Urine analysis with SG 1,016, protein 30, leukocytes negative, hgb negative   EKG 105 bpm, right axis deviation, normal intervals, qtc 488, atrial fibrillation rhythm with no significant ST segment or T wave changes.   CT head and neck with no acute intracranial changes or cervical spine injury.  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  Sable moderate narrowing of the supraclinoid left ICA.  Stable atherosclerotic changed at the right carotid bifurcation and right cavernous internal carotid artery without significant stenosis.  Mild narrowing of distal branch vessels without a significant proximal stenosis or occlusion within the ACA or MCA branches.   Assessment and Plan: CVA (cerebral vascular accident) (HCC) Plan to continue antiplatelet therapy with aspirin and clopidogrel.  Continue statin therapy.   Neuro checks q 4 hrs Follow up on brain MRI and echocardiogram.  Follow up with neurology recommendations.   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   Essential hypertension Continue blood pressure control with losartan and metoprolol.   Atrial fibrillation (HCC) Status post Watchman procedure.  Plan to continue dual antiplatelet therapy with aspirin and clopidogrel.  Rate control with metoprolol.  Continue telemetry montoring.    Rheumatoid arthritis (HCC) Continue with hydroxychloroquine.   Peripheral vascular disease (HCC) Continue blood pressure control, antiplatelet therapy and statin.    Hypothyroidism Continue with levothyroxine.   Chronic pain syndrome Resume pain control with oxycodone.  Continue divalproex PT and OT   Follow up on left lower extremity radiographs.   Hypokalemia Will add 40 kcl po and will follow up on renal function and electrolytes.  Serum cr has been stable on admission at 0,80  Obesity, class 1 Calculated BMI is 33.6         Subjective: Patient with persistent left knee pain, no chest pain, no dyspnea.   Physical Exam: Vitals:   12/01/23 2216 12/02/23 0024 12/02/23 0603 12/02/23 0851  BP: (!) 183/96 (!) 173/100 (!) 169/102 (!) 141/89  Pulse: 85 81 (!) 109 100  Resp: 20 16 20 19   Temp: 98 F (36.7 C) 98.4 F (36.9 C) 98.1 F (36.7 C) 98.6 F (37 C)  TempSrc:  Oral    SpO2: 96% 100% 98% 100%  Weight: 78.1 kg     Height: 5' (1.524 m)  Neurology awake and alert ENT with mild pallor Cardiovascular with S1 and S2 present and regular with no gallops, rubs or murmurs Respiratory with no rales or wheezing, no rhonchi Abdomen with no distention Positive pain at her lower extremity, with decreased of motion  Data Reviewed:    Family Communication: no family at the bedside    Disposition: Status is: Observation The patient remains OBS appropriate and will d/c before 2 midnights.  Planned Discharge Destination: Home      Author: Coralie Keens, MD 12/02/2023 1:40 PM  For on call review [email or web address].

## 2023-12-02 NOTE — Plan of Care (Signed)

## 2023-12-03 ENCOUNTER — Observation Stay (HOSPITAL_COMMUNITY)
Admit: 2023-12-03 | Discharge: 2023-12-03 | Disposition: A | Discharge status: Home / Self Care | Attending: Family Medicine | Admitting: Family Medicine

## 2023-12-03 DIAGNOSIS — Y92019 Unspecified place in single-family (private) house as the place of occurrence of the external cause: Secondary | ICD-10-CM | POA: Diagnosis not present

## 2023-12-03 DIAGNOSIS — Z87891 Personal history of nicotine dependence: Secondary | ICD-10-CM | POA: Diagnosis not present

## 2023-12-03 DIAGNOSIS — G894 Chronic pain syndrome: Secondary | ICD-10-CM | POA: Diagnosis present

## 2023-12-03 DIAGNOSIS — I6389 Other cerebral infarction: Secondary | ICD-10-CM

## 2023-12-03 DIAGNOSIS — R296 Repeated falls: Secondary | ICD-10-CM | POA: Diagnosis present

## 2023-12-03 DIAGNOSIS — I4891 Unspecified atrial fibrillation: Secondary | ICD-10-CM | POA: Diagnosis present

## 2023-12-03 DIAGNOSIS — I25810 Atherosclerosis of coronary artery bypass graft(s) without angina pectoris: Secondary | ICD-10-CM | POA: Diagnosis present

## 2023-12-03 DIAGNOSIS — I639 Cerebral infarction, unspecified: Secondary | ICD-10-CM | POA: Diagnosis present

## 2023-12-03 DIAGNOSIS — I10 Essential (primary) hypertension: Secondary | ICD-10-CM | POA: Diagnosis present

## 2023-12-03 DIAGNOSIS — E66811 Obesity, class 1: Secondary | ICD-10-CM | POA: Diagnosis present

## 2023-12-03 DIAGNOSIS — I6522 Occlusion and stenosis of left carotid artery: Secondary | ICD-10-CM | POA: Diagnosis present

## 2023-12-03 DIAGNOSIS — I69354 Hemiplegia and hemiparesis following cerebral infarction affecting left non-dominant side: Secondary | ICD-10-CM | POA: Diagnosis not present

## 2023-12-03 DIAGNOSIS — F32A Depression, unspecified: Secondary | ICD-10-CM | POA: Diagnosis present

## 2023-12-03 DIAGNOSIS — E876 Hypokalemia: Secondary | ICD-10-CM | POA: Diagnosis present

## 2023-12-03 DIAGNOSIS — I2510 Atherosclerotic heart disease of native coronary artery without angina pectoris: Secondary | ICD-10-CM | POA: Diagnosis present

## 2023-12-03 DIAGNOSIS — Z6833 Body mass index (BMI) 33.0-33.9, adult: Secondary | ICD-10-CM | POA: Diagnosis not present

## 2023-12-03 DIAGNOSIS — W19XXXA Unspecified fall, initial encounter: Secondary | ICD-10-CM | POA: Diagnosis present

## 2023-12-03 DIAGNOSIS — Z9181 History of falling: Secondary | ICD-10-CM | POA: Diagnosis not present

## 2023-12-03 DIAGNOSIS — I739 Peripheral vascular disease, unspecified: Secondary | ICD-10-CM | POA: Diagnosis present

## 2023-12-03 DIAGNOSIS — G4700 Insomnia, unspecified: Secondary | ICD-10-CM | POA: Diagnosis present

## 2023-12-03 DIAGNOSIS — M069 Rheumatoid arthritis, unspecified: Secondary | ICD-10-CM | POA: Diagnosis present

## 2023-12-03 DIAGNOSIS — I4811 Longstanding persistent atrial fibrillation: Secondary | ICD-10-CM | POA: Diagnosis not present

## 2023-12-03 DIAGNOSIS — K219 Gastro-esophageal reflux disease without esophagitis: Secondary | ICD-10-CM | POA: Diagnosis present

## 2023-12-03 DIAGNOSIS — Z8249 Family history of ischemic heart disease and other diseases of the circulatory system: Secondary | ICD-10-CM | POA: Diagnosis not present

## 2023-12-03 DIAGNOSIS — E039 Hypothyroidism, unspecified: Secondary | ICD-10-CM | POA: Diagnosis present

## 2023-12-03 DIAGNOSIS — I63521 Cerebral infarction due to unspecified occlusion or stenosis of right anterior cerebral artery: Secondary | ICD-10-CM | POA: Diagnosis present

## 2023-12-03 DIAGNOSIS — Z7902 Long term (current) use of antithrombotics/antiplatelets: Secondary | ICD-10-CM | POA: Diagnosis not present

## 2023-12-03 DIAGNOSIS — M25472 Effusion, left ankle: Secondary | ICD-10-CM | POA: Insufficient documentation

## 2023-12-03 DIAGNOSIS — E785 Hyperlipidemia, unspecified: Secondary | ICD-10-CM | POA: Diagnosis present

## 2023-12-03 DIAGNOSIS — Z79890 Hormone replacement therapy (postmenopausal): Secondary | ICD-10-CM | POA: Diagnosis not present

## 2023-12-03 LAB — ECHOCARDIOGRAM COMPLETE
AR max vel: 2.05 cm2
AV Area VTI: 2.16 cm2
AV Area mean vel: 1.85 cm2
AV Mean grad: 3 mmHg
AV Peak grad: 4.2 mmHg
Ao pk vel: 1.03 m/s
Area-P 1/2: 8.43 cm2
Height: 60 in
S' Lateral: 2.3 cm
Weight: 2754.87 [oz_av]

## 2023-12-03 LAB — BASIC METABOLIC PANEL
Anion gap: 9 (ref 5–15)
BUN: 9 mg/dL (ref 8–23)
CO2: 25 mmol/L (ref 22–32)
Calcium: 8.9 mg/dL (ref 8.9–10.3)
Chloride: 102 mmol/L (ref 98–111)
Creatinine, Ser: 0.77 mg/dL (ref 0.44–1.00)
GFR, Estimated: >60 mL/min (ref >60)
Glucose, Bld: 105 mg/dL — ABNORMAL HIGH (ref 70–99)
Potassium: 4 mmol/L (ref 3.5–5.1)
Sodium: 136 mmol/L (ref 135–145)

## 2023-12-03 NOTE — Progress Notes (Signed)
 Progress Note   Patient: Carmen [name] WUJ:[id number] DOB: 1941/04/20 DOA: 12/01/2023     0 DOS: the patient was seen and examined on 12/03/2023   Brief hospital course: Carmen [name] was admitted to the hospital with the working diagnosis of acute CVA.   83 yo female with the past medical history of atrial fibrillation, hypertension, hyperlipidemia, coronary artery disease, and history of CVA who presented with a mechanical fall. Patient was noted to have difficulty ambulating, requiring more assistance than usual for the last 2 days. She was noted to have positive weakness, predominantly on the left side, resulting in multiple mechanical falls. On her initial physical examination her blood pressure was 158/83, HR 87 RR 21 and 02 saturation 96%, lungs with no wheezing or rales, heart with S1 and S2 present and regular, abdomen with no distention and no lower extremity edema. Neurologically not focal.   Na 141, K 3,4 CL 105 bicarbonate 27, glucose 121 bun 11 cr 0,80  Wbc 6,8 hgb 12.2 plt 196  Urine analysis with SG 1,016, protein 30, leukocytes negative, hgb negative   EKG 105 bpm, right axis deviation, normal intervals, qtc 488, atrial fibrillation rhythm with no significant ST segment or T wave changes.   CT head and neck with no acute intracranial changes or cervical spine injury.  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  Sable moderate narrowing of the supraclinoid left ICA.  Stable atherosclerotic changed at the right carotid bifurcation and right cavernous internal carotid artery without significant stenosis.  Mild narrowing of distal branch vessels without a significant proximal stenosis or occlusion within the ACA or MCA branches.   Assessment and Plan: Acute CVA (cerebral vascular accident) (HCC) MRI of the brain showed punctate foci of restricted diffusion along the margin of previous  right ACA territory infarct involving right cingulate gyrus consistent with small acute infarct. Echocardiogram showed EF 55 to 60%, left atrial size dilation. Neurology follow-up appreciated. Continue dual antiplatelet therapy aspirin, Plavix for 21 days followed by Plavix and 5 mg daily after that. Continue statin therapy.  Neuro checks as per floor protocol. Continue PT OT. Outpatient neurology follow-up.  Left ankle swelling, pain- This is limiting her working with PT OT. X-rays reviewed and negative for fracture dislocations. Continue pain control.  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   Essential hypertension Continue blood pressure control with losartan and metoprolol.   Atrial fibrillation (HCC) Status post Watchman procedure.  Plan to continue dual antiplatelet therapy with aspirin and clopidogrel for 21 days followed by Plavix therapy. Rate control with metoprolol.  Continue telemetry montoring.   Rheumatoid arthritis (HCC) Continue with hydroxychloroquine.   Peripheral vascular disease (HCC) Continue blood pressure control, antiplatelet therapy and statin.   Hypothyroidism Continue with levothyroxine.   Chronic pain syndrome Resume pain control with oxycodone.  Continue divalproex PT and OT   Follow up on left lower extremity radiographs.   Hypokalemia Resolved. Supplement as needed  Obesity, class 1 Calculated BMI is 33.6      Out of bed to chair. Incentive spirometry. Nursing supportive care. Fall, aspiration precautions. DVT prophylaxis   Code Status: Full Code Subjective: Patient is seen and examined today morning.  Patient does have left ankle pain, swelling asks for pain medications.  Daughter at bedside asked about  discharge plan.  Physical Exam: Vitals:   12/03/23 0204 12/03/23 0427 12/03/23 0856 12/03/23  1120  BP: (!) 176/110 (!) 165/99 (!) 182/103 121/73  Pulse: 87 93 86 98  Resp:  18 18 17   Temp:  97.6 F (36.4 C) 98.3 F (36.8 C) 97.7 F (36.5 C)  TempSrc:   Oral   SpO2: 96% 98% 97% 97%  Weight:      Height:        General - Elderly obese Caucasian female, in distress secondary to pain HEENT - PERRLA, EOMI, atraumatic head, non tender sinuses. Lung - Clear, basal rales, rhonchi, no wheezes. Heart - S1, S2 heard, no murmurs, rubs, left greater than right pedal edema. Abdomen - Soft, non tender, bowel sounds good Neuro - Alert, awake and oriented x 3, left lower extremity weak Skin - Warm and dry.  Left ankle tenderness, decreased range of motion  Data Reviewed:      Latest Ref Rng & Units 12/01/2023    8:01 AM 08/14/2023    1:14 PM 06/14/2023   12:27 PM  CBC  WBC 4.0 - 10.5 K/uL 6.8  9.6  6.9   Hemoglobin 12.0 - 15.0 g/dL 98.1  19.1  47.8   Hematocrit 36.0 - 46.0 % 37.4  35.1  36.1   Platelets 150 - 400 K/uL 196  190.0  207.0       Latest Ref Rng & Units 12/03/2023    4:12 AM 12/01/2023    8:01 AM 02/02/2023    9:42 AM  BMP  Glucose 70 - 99 mg/dL 295  621  308   BUN 8 - 23 mg/dL 9  11  14    Creatinine 0.44 - 1.00 mg/dL 6.57  8.46  9.62   BUN/Creat Ratio 12 - 28   16   Sodium 135 - 145 mmol/L 136  141  139   Potassium 3.5 - 5.1 mmol/L 4.0  3.4  4.5   Chloride 98 - 111 mmol/L 102  105  101   CO2 22 - 32 mmol/L 25  27  24    Calcium 8.9 - 10.3 mg/dL 8.9  9.1  9.1    ECHOCARDIOGRAM COMPLETE Result Date: 12/03/2023    ECHOCARDIOGRAM REPORT   Patient Name:   Carmen [name] Date of Exam: 12/03/2023 Medical Rec #:  [id number]    Height:       60.0 in Accession #:    [id number]   Weight:       172.2 lb Date of Birth:  [birth year]    BSA:          1.751 m Patient Age:    82 years     BP:           165/99 mmHg Patient Gender: F            HR:           93 bpm. Exam Location:  [facility] Procedure: 2D Echo, Cardiac Doppler and Color Doppler (Both Spectral and Color            Flow Doppler were  utilized during procedure). Indications:     TIA G45.9  History:         Patient has prior history of Echocardiogram examinations, most                  recent 12/28/2022. Stroke and TIA; Risk Factors:Hypertension.  Sonographer:     Cristela Blue Referring Phys:  [phone] STEVEN J NEWTON Diagnosing Phys:  Lorine Bears MD IMPRESSIONS  1. Left ventricular ejection fraction, by estimation, is 55 to 60%. The left ventricle has normal function. The left ventricle has no regional wall motion abnormalities. There is moderate left ventricular hypertrophy. Left ventricular diastolic parameters are indeterminate.  2. Right ventricular systolic function is normal. The right ventricular size is normal. There is mildly elevated pulmonary artery systolic pressure.  3. Left atrial size was severely dilated.  4. Right atrial size was mildly dilated.  5. The mitral valve is normal in structure. Moderate mitral valve regurgitation. No evidence of mitral stenosis.  6. The aortic valve is normal in structure. Aortic valve regurgitation is not visualized. No aortic stenosis is present.  7. The inferior vena cava is normal in size with greater than 50% respiratory variability, suggesting right atrial pressure of 3 mmHg. FINDINGS  Left Ventricle: Left ventricular ejection fraction, by estimation, is 55 to 60%. The left ventricle has normal function. The left ventricle has no regional wall motion abnormalities. The left ventricular internal cavity size was normal in size. There is  moderate left ventricular hypertrophy. Left ventricular diastolic parameters are indeterminate. Right Ventricle: The right ventricular size is normal. No increase in right ventricular wall thickness. Right ventricular systolic function is normal. There is mildly elevated pulmonary artery systolic pressure. The tricuspid regurgitant velocity is 2.95  m/s, and with an assumed right atrial pressure of 5 mmHg, the estimated right ventricular systolic pressure is 39.8 mmHg.  Left Atrium: Left atrial size was severely dilated. Right Atrium: Right atrial size was mildly dilated. Pericardium: There is no evidence of pericardial effusion. Mitral Valve: The mitral valve is normal in structure. Moderate mitral valve regurgitation. No evidence of mitral valve stenosis. Tricuspid Valve: The tricuspid valve is normal in structure. Tricuspid valve regurgitation is trivial. No evidence of tricuspid stenosis. Aortic Valve: The aortic valve is normal in structure. Aortic valve regurgitation is not visualized. No aortic stenosis is present. Aortic valve mean gradient measures 3.0 mmHg. Aortic valve peak gradient measures 4.2 mmHg. Aortic valve area, by VTI measures 2.16 cm. Pulmonic Valve: The pulmonic valve was normal in structure. Pulmonic valve regurgitation is not visualized. No evidence of pulmonic stenosis. Aorta: The aortic root is normal in size and structure. Venous: The inferior vena cava is normal in size with greater than 50% respiratory variability, suggesting right atrial pressure of 3 mmHg. IAS/Shunts: No atrial level shunt detected by color flow Doppler.  LEFT VENTRICLE PLAX 2D LVIDd:         3.40 cm LVIDs:         2.30 cm LV PW:         1.20 cm LV IVS:        1.60 cm LVOT diam:     2.00 cm LV SV:         36 LV SV Index:   21 LVOT Area:     3.14 cm  RIGHT VENTRICLE RV Basal diam:  3.20 cm RV Mid diam:    2.60 cm LEFT ATRIUM             Index        RIGHT ATRIUM           Index LA diam:        5.30 cm 3.03 cm/m   RA Area:     21.60 cm LA Vol (A2C):   68.7 ml 39.22 ml/m  RA Volume:   55.10 ml  31.46 ml/m LA Vol (A4C):   42.5  ml 24.27 ml/m LA Biplane Vol: 52.9 ml 30.20 ml/m  AORTIC VALVE AV Area (Vmax):    2.05 cm AV Area (Vmean):   1.85 cm AV Area (VTI):     2.16 cm AV Vmax:           103.00 cm/s AV Vmean:          76.400 cm/s AV VTI:            0.167 m AV Peak Grad:      4.2 mmHg AV Mean Grad:      3.0 mmHg LVOT Vmax:         67.10 cm/s LVOT Vmean:        45.100 cm/s LVOT  VTI:          0.115 m LVOT/AV VTI ratio: 0.69  AORTA Ao Root diam: 3.00 cm MITRAL VALVE                TRICUSPID VALVE MV Area (PHT): 8.43 cm     TR Peak grad:   34.8 mmHg MV Decel Time: 90 msec      TR Vmax:        295.00 cm/s MV E velocity: 124.00 cm/s                             SHUNTS                             Systemic VTI:  0.12 m                             Systemic Diam: 2.00 cm Lorine Bears MD Electronically signed by Lorine Bears MD Signature Date/Time: 12/03/2023/1:01:51 PM    Final    DG Ankle 2 Views Left Result Date: 12/02/2023 CLINICAL DATA:  Left lower extremity pain extending from the hip into the ankle. EXAM: LEFT ANKLE - 2 VIEW; LEFT KNEE - COMPLETE 4+ VIEW COMPARISON:  None Available. FINDINGS: Left knee: The bones appear mildly demineralized. No evidence of acute fracture or dislocation. The joint spaces are largely preserved. 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 No evidence of significant joint effusion. Vascular calcifications and vascular clips are noted. Left ankle: The bones appear mildly demineralized. No evidence of acute fracture or dislocation. The joint spaces are preserved. Possible mild soft tissue edema distally without evidence of foreign body. IMPRESSION: No acute osseous findings or significant arthropathic changes in the left knee or ankle. 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 Possible mild nonspecific edema distally. Electronically Signed   By: Carey Bullocks M.D.   On: 12/02/2023 14:33   DG Knee Complete 4 Views Left Result Date: 12/02/2023 CLINICAL DATA:  Left lower extremity pain extending from the hip into the ankle. EXAM: LEFT ANKLE - 2 VIEW; LEFT KNEE - COMPLETE 4+ VIEW COMPARISON:  None Available. FINDINGS: Left knee: The bones appear mildly demineralized. No evidence of acute fracture or dislocation. The joint spaces are largely preserved. 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 No evidence of significant joint effusion. Vascular calcifications and vascular  clips are noted. Left ankle: The bones appear mildly demineralized. No evidence of acute fracture or dislocation. The joint spaces are preserved. Possible mild soft tissue edema distally without evidence of foreign body. IMPRESSION: No acute osseous findings or significant arthropathic changes in the left knee or ankle. 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 Possible mild nonspecific  edema distally. Electronically Signed   By: Carey Bullocks M.D.   On: 12/02/2023 14:33   DG HIP UNILAT WITH PELVIS 2-3 VIEWS LEFT Result Date: 12/02/2023 CLINICAL DATA:  Left hip pain. EXAM: DG HIP (WITH OR WITHOUT PELVIS) 2-3V LEFT COMPARISON:  Hip radiographs dated 03/14/2021. FINDINGS: There is no evidence of hip fracture or dislocation. Mild bilateral hip osteoarthritis and moderate degenerative changes in the spine. IMPRESSION: No acute osseous injury. Electronically Signed   By: Romona Curls M.D.   On: 12/02/2023 14:31   Family Communication: Discussed with daughter at bedside, she understand and agree. All questions answereed.  Disposition: Status is: changed to Inpatient Remains inpatient appropriate because: stroke work up  Planned Discharge Destination: Rehab     Time spent: 40 minutes  Author: Marcelino Duster, MD 12/03/2023 3:09 PM Secure chat 7am to 7pm For on call review [email or web address].

## 2023-12-03 NOTE — Evaluation (Signed)
 Occupational Therapy Evaluation Patient Details Name: Carmen [name] MRN: [id number] DOB: 09-02-1941 Today's Date: 12/03/2023   History of Present Illness   Pt presented to ER secondary to L-sided weakness; admitted for TIA/CVA work-up.  MRI significant for R ACA infarct (R cingulate gyrus)     Clinical Impressions Pt was seen for OT evaluation and cotx with PT this date. Prior to hospital admission, pt was indep with ADL, PRN assist for IADL from daughter, and RW versus rollator for mobility. Pt lives in an inlaw suite on daughter's property. Pt presents to acute OT demonstrating impaired ADL performance and functional mobility 2/2 decreased strength, balance, activity tolerance, and LLE pain (See OT problem list for additional functional deficits). Pt currently requires MOD A for bed mobility, MIN-MODA +2 for STS and step pivot to the recliner. Pt expressing frustrations at start of session, emotional support and active listening utilized. Per pt's request, charge nurse and unit director notified. Pt would benefit from skilled OT services to address noted impairments and functional limitations (see below for any additional details) in order to maximize safety and independence while minimizing falls risk and caregiver burden.    If plan is discharge home, recommend the following:   Two people to help with walking and/or transfers;A lot of help with bathing/dressing/bathroom;Direct supervision/assist for medications management;Assist for transportation;Assistance with cooking/housework;Help with stairs or ramp for entrance     Functional Status Assessment   Patient has had a recent decline in their functional status and demonstrates the ability to make significant improvements in function in a reasonable and predictable amount of time.     Equipment Recommendations   Other (comment) (defer)     Recommendations for Other Services         Precautions/Restrictions    Precautions Precautions: Fall Restrictions Weight Bearing Restrictions Per Provider Order: No     Mobility Bed Mobility Overal bed mobility: Needs Assistance Bed Mobility: Supine to Sit     Supine to sit: Mod assist     General bed mobility comments: vc's for technique    Transfers Overall transfer level: Needs assistance Equipment used: Rolling walker (2 wheels) Transfers: Sit to/from Stand Sit to Stand: Min assist, Mod assist, +2 physical assistance           General transfer comment: x2 trials standing from bed; 1st trial mod assist x2 and 2nd trial min assist x2; vc's for UE/LE positioning (pt preferring to keep her B hands on walker despite cueing to push off of bed)      Balance Overall balance assessment: Needs assistance Sitting-balance support: Feet supported, No upper extremity supported Sitting balance-[name]: Good Sitting balance - Comments: steady reaching within BOS   Standing balance support: Bilateral upper extremity supported Standing balance-[name]: Fair                             ADL either performed or assessed with clinical judgement   ADL                                         General ADL Comments: Pt requires MAX A for donning socks (more assist required on LLE), set up for seated grooming tasks, and +2 assist for ADL trasnfers/mobility     Vision         Perception  Praxis         Pertinent Vitals/Pain Pain Assessment Pain Assessment: 0-10 Pain Score: 7  Pain Location: sharp shooting pain LLE toes up to her back Pain Descriptors / Indicators: Sharp, Shooting Pain Intervention(s): Limited activity within patient's tolerance, Monitored during session, Premedicated before session, Repositioned, Patient requesting pain meds-RN notified     Extremity/Trunk Assessment Upper Extremity Assessment Upper Extremity Assessment: Overall WFL for tasks assessed   Lower Extremity  Assessment Lower Extremity Assessment: Defer to PT evaluation;Generalized weakness;LLE deficits/detail LLE: Unable to fully assess due to pain   Cervical / Trunk Assessment Cervical / Trunk Assessment: Normal   Communication Communication Communication: No apparent difficulties   Cognition Arousal: Alert Behavior During Therapy: WFL for tasks assessed/performed Cognition: No apparent impairments                               Following commands: Intact       Cueing  General Comments   Cueing Techniques: Verbal cues      Exercises Other Exercises Other Exercises: active listening and emotional support provided   Shoulder Instructions      Home Living Family/patient expects to be discharged to:: Skilled nursing facility Living Arrangements: Alone                               Additional Comments: Lives in in-law suite at daughter's home; single-level, ramp access      Prior Functioning/Environment Prior Level of Function : Independent/Modified Independent             Mobility Comments: Intermittent use of RW in home environment; 4WRW for community distances.  Does endorse 2 falls immediately prior to admission. ADLs Comments: Indep with ADLs; daughter assists wth transportation and household chores as needed    OT Problem List: Decreased strength;Pain;Impaired balance (sitting and/or standing);Decreased activity tolerance;Decreased knowledge of use of DME or AE   OT Treatment/Interventions: Self-care/ADL training;Therapeutic exercise;Therapeutic activities;DME and/or AE instruction;Patient/family education;Balance training      OT Goals(Current goals can be found in the care plan section)   Acute Rehab OT Goals Patient Stated Goal: get better OT Goal Formulation: With patient Time For Goal Achievement: 12/17/23 Potential to Achieve Goals: Good ADL Goals Pt Will Perform Lower Body Dressing: with min assist;sit to/from stand Pt  Will Transfer to Toilet: with supervision;ambulating;bedside commode (LRAD) Pt Will Perform Toileting - Clothing Manipulation and hygiene: with modified independence;sitting/lateral leans Additional ADL Goal #1: Pt will verbalize plan to implement at least 1 learned falls prevention/ECS into daily ADL/IADL routine to improve safety, indep.   OT Frequency:  Min 1X/week    Co-evaluation PT/OT/SLP Co-Evaluation/Treatment: Yes Reason for Co-Treatment: For patient/therapist safety;To address functional/ADL transfers PT goals addressed during session: Mobility/safety with mobility;Proper use of DME OT goals addressed during session: ADL's and self-care      AM-PAC OT "6 Clicks" Daily Activity     Outcome Measure Help from another person eating meals?: None Help from another person taking care of personal grooming?: A Little Help from another person toileting, which includes using toliet, bedpan, or urinal?: A Lot Help from another person bathing (including washing, rinsing, drying)?: A Lot Help from another person to put on and taking off regular upper body clothing?: A Little Help from another person to put on and taking off regular lower body clothing?: A Lot 6 Click Score: 16   End  of Session Equipment Utilized During Treatment: Gait belt;Rolling walker (2 wheels) Nurse Communication: Mobility status;Patient requests pain meds;Other (comment) (pt endorsing frustrations with care, charge RN notified, unit director notified)  Activity Tolerance: Patient tolerated treatment well Patient left: in chair;with call bell/phone within reach;with chair alarm set  OT Visit Diagnosis: Other abnormalities of gait and mobility (R26.89);Pain Pain - Right/Left: Left Pain - part of body: Knee;Hip;Ankle and joints of foot;Leg                Time: 5409-8119 OT Time Calculation (min): 28 min Charges:  OT General Charges $OT Visit: 1 Visit OT Evaluation $OT Eval Low Complexity: 1 Low  Arman Filter., MPH,  MS, OTR/L ascom [phone] 12/03/23, 1:47 PM

## 2023-12-03 NOTE — Consult Note (Signed)
 NEUROLOGY CONSULT NOTE   Date of service: December 03, 2023 Patient Name: Carmen [name] MRN:  [id number] DOB:  02-16-1941 Chief Complaint: punctate acute CVA Requesting Provider: Marcelino Duster, MD  History of Present Illness  Carmen [name] is a 83 y.o. 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  She typically has left-sided weakness secondary to remote infarct but she has been having more difficulty ambulating and requiring more assistance for the past 2 days.  Today she reports to me that she feels a little bit weaker than usual and her examination is additionally significantly limited secondary to pain from her falls.  CT head showed no acute process.  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  MRI brain showed punctate acute infarct adjacent to remote right ICA infarct.  TTE is pending.  ROS  Comprehensive ROS performed and pertinent positives documented in HPI   Past History   Past Medical History:  Diagnosis Date   Abnormal CT scan, lumbar spine (05/11/2021) 05/17/2021   (05/11/2021) LUMBAR CT FINDINGS: Alignment: Lumbar levocurvature, apex L4. Mild lateral listhesis L4 on L5 of approximately 3 mm. Vertebrae: Remote appearing superior endplate deformities at T12 with 10% height loss and L1 with up to 20% height loss. Multilevel discogenic and facet degenerative changes. Mild bilateral SI joint arthrosis.  DISC LEVELS: T11-T12: Near complete disc height loss with de   Abnormal MRI, cervical spine (05/12/2021) 05/17/2021   (05/12/2021) CERVICAL MRI FINDINGS: Motion artifact is present. Posterior Fossa, vertebral arteries, paraspinal tissues: Left superior cerebellar  infarct.   DISC LEVELS: C2-C3: Disc bulge with endplate osteophytes. Uncovertebral and facet hypertrophy. C3-C4: Disc bulge with endplate osteophytes. Uncovertebral and facet hypertrophy. Mild canal stenosis. Marked foraminal stenosis. C4-C5: Disc bulge w   Allergy    Anemia    Anxiety    Arthritis    Back pain    Coronary artery disease    Depression    History of stroke 10/02/2020   Hypertension    Peripheral vascular disease (HCC)    Personal history of nicotine dependence 10/02/2020   Prsnl hx of TIA (TIA), and cereb infrc w/o resid deficits 10/02/2020   Stroke [facility])    Thyroid disease     Past Surgical History:  Procedure Laterality Date   back injections     CARDIAC CATHETERIZATION     CATARACT EXTRACTION     CORONARY ANGIOPLASTY     CORONARY ARTERY BYPASS GRAFT     2005   LEFT ATRIAL APPENDAGE OCCLUSION N/A 12/28/2022   Procedure: LEFT ATRIAL APPENDAGE OCCLUSION;  Surgeon: Lanier Prude, MD;  Location: [facility] INVASIVE CV LAB;  Service: Cardiovascular;  Laterality: N/A;   TEE WITHOUT CARDIOVERSION N/A 12/28/2022   Procedure: TRANSESOPHAGEAL ECHOCARDIOGRAM;  Surgeon: Lanier Prude, MD;  Location: [facility] INVASIVE CV LAB;  Service: Cardiovascular;  Laterality: N/A;    Family History: Family History  Problem Relation Age of Onset   Breast cancer Mother    Heart attack Father    Bipolar disorder Daughter    Diabetes Mellitus II Neg Hx     Social History  reports that she quit smoking about 3 years ago. Her smoking use included cigarettes. She started smoking about 33 years  ago. She has never used smokeless tobacco. She reports that she does not currently use alcohol. She reports current drug use. Drug: Hydrocodone.  No Known Allergies  Medications   Current Facility-Administered Medications:    acetaminophen (TYLENOL) tablet 650 mg, 650 mg, Oral, Q6H PRN, Arrien, York Ram, MD   aspirin EC tablet 81 mg, 81 mg, Oral, Daily, Jefferson Fuel, MD, 81 mg at  12/02/23 1437   atorvastatin (LIPITOR) tablet 40 mg, 40 mg, Oral, QHS, Arrien, York Ram, MD, 40 mg at 12/02/23 2146   clopidogrel (PLAVIX) tablet 75 mg, 75 mg, Oral, Daily, Jefferson Fuel, MD, 75 mg at 12/02/23 1437   cyanocobalamin (VITAMIN B12) tablet 500 mcg, 500 mcg, Oral, Daily, Arrien, York Ram, MD   divalproex (DEPAKOTE) DR tablet 500 mg, 500 mg, Oral, QHS, Arrien, York Ram, MD, 500 mg at 12/02/23 2146   enoxaparin (LOVENOX) injection 40 mg, 40 mg, Subcutaneous, Q24H, Floydene Flock, MD, 40 mg at 12/02/23 1439   hydroxychloroquine (PLAQUENIL) tablet 200 mg, 200 mg, Oral, BID, Arrien, York Ram, MD, 200 mg at 12/02/23 2146   levothyroxine (SYNTHROID) tablet 137 mcg, 137 mcg, Oral, QAC breakfast, Arrien, York Ram, MD   loratadine (CLARITIN) tablet 10 mg, 10 mg, Oral, Daily, Arrien, York Ram, MD, 10 mg at 12/02/23 1438   losartan (COZAAR) tablet 12.5 mg, 12.5 mg, Oral, Daily, Arrien, York Ram, MD, 12.5 mg at 12/02/23 1437   metoprolol succinate (TOPROL-XL) 24 hr tablet 50 mg, 50 mg, Oral, Daily, Arrien, York Ram, MD, 50 mg at 12/02/23 1437   oxyCODONE (Oxy IR/ROXICODONE) immediate release tablet 5 mg, 5 mg, Oral, Q6H PRN, Arrien, York Ram, MD, 5 mg at 12/03/23 0119   pantoprazole (PROTONIX) EC tablet 40 mg, 40 mg, Oral, BID, Arrien, York Ram, MD, 40 mg at 12/02/23 2146   traZODone (DESYREL) tablet 100 mg, 100 mg, Oral, QHS PRN, Coralie Keens, MD  Vitals   Vitals:   12/03/23 0202 12/03/23 0203 12/03/23 0204 12/03/23 0427  BP: (!) 169/105  (!) 176/110 (!) 165/99  Pulse: (!) 104 (!) 101 87 93  Resp:    18  Temp: 97.7 F (36.5 C)   97.6 F (36.4 C)  TempSrc:      SpO2: 94% 100% 96% 98%  Weight:      Height:        Body mass index is 33.63 kg/m.  Physical Exam   Gen: patient lying in bed, NAD CV: extremities appear well-perfused Resp: normal WOB  Neurologic Examination    MS: alert, oriented  x4, follows commands Speech: mild dysarthria, no aphasia CN: PERRL, VFF, EOMI, sensation intact, R lower facial droop, hearing intact to voice Motor: drift in LUE and RUE but not to bed, LLE drift to bed and pain-limited Sensory: SILT Reflexes: 2+ symm with toes down bilat Coordination: FNF intact bilat Gait: deferred  NIHSS components Score: Comment  1a Level of Conscious 0[x]  1[]  2[]  3[]      1b LOC Questions 0[x]  1[]  2[]       1c LOC Commands 0[x]  1[]  2[]       2 Best Gaze 0[x]  1[]  2[]       3 Visual 0[x]  1[]  2[]  3[]      4 Facial Palsy 0[]  1[]  2[x]  3[]      5a Motor Arm - left 0[]  1[x]  2[]  3[]  4[]  UN[]    5b Motor Arm - Right 0[x]  1[]  2[]  3[]  4[]  UN[]    6a Motor Leg - Left 0[]  1[]  2[x]  3[]   4[]  UN[]    6b Motor Leg - Right 0[]  1[x]  2[]  3[]  4[]  UN[]    7 Limb Ataxia 0[x]  1[]  2[]  3[]  UN[]     8 Sensory 0[x]  1[]  2[]  UN[]      9 Best Language 0[x]  1[]  2[]  3[]      10 Dysarthria 0[]  1[x]  2[]  UN[]      11 Extinct. and Inattention 0[x]  1[]  2[]       TOTAL:  6       Labs/Imaging/Neurodiagnostic studies   CBC:  Recent Labs  Lab Dec 26, 2023 0801  WBC 6.8  NEUTROABS 4.8  HGB 12.1  HCT 37.4  MCV 88.4  PLT 196   Basic Metabolic Panel:  Lab Results  Component Value Date   NA 136 12/03/2023   K 4.0 12/03/2023   CO2 25 12/03/2023   GLUCOSE 105 (H) 12/03/2023   BUN 9 12/03/2023   CREATININE 0.77 12/03/2023   CALCIUM 8.9 12/03/2023   GFRNONAA >60 12/03/2023   GFRAA 73 11/22/2020   Lipid Panel:  Lab Results  Component Value Date   LDLCALC 47 12/02/2023   HgbA1c:  Lab Results  Component Value Date   HGBA1C 6.0 08/14/2023   Urine Drug Screen:     Component Value Date/Time   LABOPIA POSITIVE (A) 05/08/2022 1205   COCAINSCRNUR NONE DETECTED 05/08/2022 1205   LABBENZ NONE DETECTED 05/08/2022 1205   AMPHETMU NONE DETECTED 05/08/2022 1205   THCU NONE DETECTED 05/08/2022 1205   LABBARB NONE DETECTED 05/08/2022 1205    Alcohol Level     Component Value Date/Time   ETH <10  10/14/2022 1017   INR  Lab Results  Component Value Date   INR 1.3 (H) 10/14/2022   APTT  Lab Results  Component Value Date   APTT 27 10/14/2022   AED levels: No results found for: "PHENYTOIN", "ZONISAMIDE", "LAMOTRIGINE", "LEVETIRACETA"  CT angio Head and Neck with contrast(Personally reviewed): 1. 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 2. Stable moderate narrowing of the supraclinoid left ICA. 3. Stable atherosclerotic changes at the right carotid bifurcation and right cavernous internal carotid artery without significant stenosis. 4. Mild narrowing of distal branch vessels without a significant proximal stenosis or occlusion within the ACA or MCA branches. 5.  Aortic Atherosclerosis (ICD10-I70.0).  MRI Brain(Personally reviewed): 1. Punctate foci of restricted diffusion along the margin of the previous right ACA territory infarct, involving the right cingulate gyrus, consistent with a small acute infarct. 2. Stable chronic encephalomalacia anteriorly in the right frontal lobe. 3. Stable chronic left cerebellar infarct. 4. Stable remote lacunar infarct in the right cerebellum. 5. 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 6. Stable atrophy and white matter disease. This likely reflects the sequela of chronic microvascular ischemia.   ASSESSMENT   Carmen [name] is a 83 y.o. female with hx of  a fib s/p Watchman device on plavix outpatient, L sided weakness 2/2 prior R ACA ischemic stroke, hypertension, hyperlipidemia, and CAD who presented with mechanical fall and worsening of baseline L sided weakness in the setting of punctate R ACA infarct. Etiology favored to be small vessel. Stroke workup completed except for TTE.   RECOMMENDATIONS   - No indication for permissive HTN >48 hrs post-symptom onset - ASA 81mg  daily + plavix 75mg  daily x21 days f/b plavix 75mg  daily  monotherapy after that - TTE pending, will f/u - PT/OT - Outpatient neurology f/u  ______________________________________________________________________    Signed, Jefferson Fuel, MD  [facility]

## 2023-12-03 NOTE — Care Management Obs Status (Signed)
 MEDICARE OBSERVATION STATUS NOTIFICATION   Patient Details  Name: Carmen [name] MRN: [id number] Date of Birth: [birth year]   Medicare Observation Status Notification Given:  Orland Dec, CMA 12/03/2023, 9:25 AM

## 2023-12-03 NOTE — Plan of Care (Signed)
 Asked by my outgoing calling to check on the echocardiogram. Echocardiogram shows LVEF 55 to 60%, moderate LVH.  Diastolic parameters indeterminate.  Left atrium severely dilated.  Right atrial size mildly dilated.  No atrial level shunt detected by color-flow Doppler. Findings not unexpected given history of atrial fibrillation. No LV thrombus Recommendations as in Dr. Sharen Hones note. Plan relayed to Dr. Sidney Ace  -- Milon Dikes, MD Neurologist [facility]

## 2023-12-03 NOTE — Progress Notes (Signed)
 Physical Therapy Treatment Patient Details Name: Carmen [name] MRN: [id number] DOB: January 21, 1941 Today's Date: 12/03/2023   History of Present Illness Pt presented to ER secondary to L-sided weakness; admitted for TIA/CVA work-up.  MRI significant for R ACA infarct (R cingulate gyrus)    PT Comments  OT present with pt upon PT arrival for PT/OT co-session.  6-7/10 sharp shooting pain reported L toes up to back (pt pre-medicated with pain medication).  During session pt mod assist semi-supine to sitting EOB; mod assist x2 to stand up to RW first trial; min assist x2 to stand up to RW 2nd trial; and CGA to min assist x2 to ambulate a few feet bed to recliner with RW use.  Limited activity d/t L LE pain and weakness.  Will continue to focus on strengthening, balance, and progressive functional mobility during hospitalization.    If plan is discharge home, recommend the following: A lot of help with walking and/or transfers;A lot of help with bathing/dressing/bathroom;Assist for transportation;Help with stairs or ramp for entrance   Can travel by private vehicle     No  Equipment Recommendations  Rolling walker (2 wheels);BSC/3in1    Recommendations for Other Services       Precautions / Restrictions Precautions Precautions: Fall Restrictions Weight Bearing Restrictions Per Provider Order: No     Mobility  Bed Mobility Overal bed mobility: Needs Assistance Bed Mobility: Supine to Sit     Supine to sit: Mod assist     General bed mobility comments: vc's for technique    Transfers Overall transfer level: Needs assistance Equipment used: Rolling walker (2 wheels) Transfers: Sit to/from Stand Sit to Stand: Min assist, Mod assist, +2 physical assistance           General transfer comment: x2 trials standing from bed; 1st trial mod assist x2 and 2nd trial min assist x2; vc's for UE/LE positioning (pt preferring to keep her B hands on walker despite cueing to push off of bed)     Ambulation/Gait Ambulation/Gait assistance: Contact guard assist, Min assist, +2 physical assistance Gait Distance (Feet): 3 Feet (bed to recliner) Assistive device: Rolling walker (2 wheels)   Gait velocity: decreased     General Gait Details: antalgic; decreased stance time L LE; vc's to increase UE support through RW to offweight L LE when taking steps   Stairs             Wheelchair Mobility     Tilt Bed    Modified Rankin (Stroke Patients Only)       Balance Overall balance assessment: Needs assistance Sitting-balance support: Feet supported, No upper extremity supported Sitting balance-[name]: Good Sitting balance - Comments: steady reaching within BOS   Standing balance support: Bilateral upper extremity supported Standing balance-[name]: Fair Standing balance comment: CGA x2 for safety static standing with B UE support on RW                            Communication Communication Communication: No apparent difficulties  Cognition Arousal: Alert Behavior During Therapy: WFL for tasks assessed/performed   PT - Cognitive impairments: No apparent impairments                         Following commands: Intact      Cueing Cueing Techniques: Verbal cues  Exercises      General Comments  Pt agreeable to therapy session.  Pertinent Vitals/Pain Pain Assessment Pain Assessment: 0-10 Pain Score: 7  Pain Location: sharp shooting pain toes up to her back Pain Descriptors / Indicators: Sharp, Shooting Pain Intervention(s): Limited activity within patient's tolerance, Monitored during session, Premedicated before session, Repositioned Pt's BP 119/86 with HR 74 bpm at rest (in bed) beginning of session; pt's BP 130/79 with HR 88 bpm sitting in recliner end of session at rest.    Home Living                          Prior Function            PT Goals (current goals can now be found in the care plan  section) Acute Rehab PT Goals Patient Stated Goal: to return home PT Goal Formulation: With patient Time For Goal Achievement: 12/16/23 Potential to Achieve Goals: Good Progress towards PT goals: Progressing toward goals    Frequency    Min 1X/week      PT Plan      Co-evaluation PT/OT/SLP Co-Evaluation/Treatment: Yes Reason for Co-Treatment: For patient/therapist safety;To address functional/ADL transfers PT goals addressed during session: Mobility/safety with mobility;Proper use of DME OT goals addressed during session: ADL's and self-care      AM-PAC PT "6 Clicks" Mobility   Outcome Measure  Help needed turning from your back to your side while in a flat bed without using bedrails?: None Help needed moving from lying on your back to sitting on the side of a flat bed without using bedrails?: A Lot Help needed moving to and from a bed to a chair (including a wheelchair)?: A Lot Help needed standing up from a chair using your arms (e.g., wheelchair or bedside chair)?: Total Help needed to walk in hospital room?: Total Help needed climbing 3-5 steps with a railing? : Total 6 Click Score: 11    End of Session Equipment Utilized During Treatment: Gait belt Activity Tolerance: Patient limited by pain Patient left: in chair;with call bell/phone within reach;with chair alarm set Nurse Communication: Mobility status;Precautions;Other (comment) (Pt's pain status) PT Visit Diagnosis: Muscle weakness (generalized) (M62.81);Difficulty in walking, not elsewhere classified (R26.2)     Time: 1610-9604 PT Time Calculation (min) (ACUTE ONLY): 23 min  Charges:    $Therapeutic Activity: 8-22 mins PT General Charges $$ ACUTE PT VISIT: 1 Visit                    Hendricks Limes, PT 12/03/23, 1:12 PM

## 2023-12-03 NOTE — Progress Notes (Signed)
*  PRELIMINARY RESULTS* Echocardiogram 2D Echocardiogram has been performed.  Carmen [name] 12/03/2023, 8:04 AM

## 2023-12-04 DIAGNOSIS — I4811 Longstanding persistent atrial fibrillation: Secondary | ICD-10-CM | POA: Diagnosis not present

## 2023-12-04 DIAGNOSIS — I25810 Atherosclerosis of coronary artery bypass graft(s) without angina pectoris: Secondary | ICD-10-CM | POA: Diagnosis not present

## 2023-12-04 DIAGNOSIS — I10 Essential (primary) hypertension: Secondary | ICD-10-CM | POA: Diagnosis not present

## 2023-12-04 DIAGNOSIS — I639 Cerebral infarction, unspecified: Secondary | ICD-10-CM | POA: Diagnosis not present

## 2023-12-04 MED ORDER — OXYCODONE HCL 5 MG PO TABS
5.0000 mg | ORAL_TABLET | ORAL | Status: DC | PRN
  Administered 2023-12-04 – 2023-12-05 (×4): 5 mg via ORAL
  Filled 2023-12-04 (×4): qty 1

## 2023-12-04 MED ORDER — TRAZODONE HCL 50 MG PO TABS
100.0000 mg | ORAL_TABLET | Freq: Every day | ORAL | Status: DC
  Administered 2023-12-04 – 2023-12-06 (×3): 100 mg via ORAL
  Filled 2023-12-04 (×3): qty 2

## 2023-12-04 MED ORDER — ENSURE ENLIVE PO LIQD
237.0000 mL | Freq: Two times a day (BID) | ORAL | Status: DC
  Administered 2023-12-04 – 2023-12-07 (×5): 237 mL via ORAL

## 2023-12-04 NOTE — Progress Notes (Signed)
 PT Cancellation Note  Patient Details Name: Carmen [name] MRN: [id number] DOB: 20-May-1941   Cancelled Treatment:    Reason Eval/Treat Not Completed: Other (comment).  Pt declining PT session at this time d/t 7/10 L foot/ankle pain.  Nurse notified of pt's pain and request for pain medication; nurse reports pt not due for pain medication at this time.  Will re-attempt PT session at a later date/time.  Hendricks Limes, PT 12/04/23, 1:30 PM

## 2023-12-04 NOTE — Progress Notes (Signed)
 Progress Note   Patient: Carmen [name] ZOX:[id number] DOB: 1940-11-10 DOA: 12/01/2023     1 DOS: the patient was seen and examined on 12/04/2023   Brief hospital course: Carmen [name] was admitted to the hospital with the working diagnosis of acute CVA.   83 yo female with the past medical history of atrial fibrillation, hypertension, hyperlipidemia, coronary artery disease, and history of CVA who presented with a mechanical fall. Patient was noted to have difficulty ambulating, requiring more assistance than usual for the last 2 days. She was noted to have positive weakness, predominantly on the left side, resulting in multiple mechanical falls. On her initial physical examination her blood pressure was 158/83, HR 87 RR 21 and 02 saturation 96%, lungs with no wheezing or rales, heart with S1 and S2 present and regular, abdomen with no distention and no lower extremity edema. Neurologically not focal.   Na 141, K 3,4 CL 105 bicarbonate 27, glucose 121 bun 11 cr 0,80  Wbc 6,8 hgb 12.2 plt 196  Urine analysis with SG 1,016, protein 30, leukocytes negative, hgb negative   EKG 105 bpm, right axis deviation, normal intervals, qtc 488, atrial fibrillation rhythm with no significant ST segment or T wave changes.   CT head and neck with no acute intracranial changes or cervical spine injury.  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  Sable moderate narrowing of the supraclinoid left ICA.  Stable atherosclerotic changed at the right carotid bifurcation and right cavernous internal carotid artery without significant stenosis.  Mild narrowing of distal branch vessels without a significant proximal stenosis or occlusion within the ACA or MCA branches.   Assessment and Plan: Acute CVA (cerebral vascular accident) (HCC) MRI of the brain showed punctate foci of restricted diffusion along the margin of previous  right ACA territory infarct involving right cingulate gyrus consistent with small acute infarct. Echocardiogram showed EF 55 to 60%, left atrial size dilation. Neurology follow-up appreciated. Continue dual antiplatelet therapy aspirin, Plavix for 21 days followed by Plavix daily after that. Continue statin therapy.  Neuro checks as per floor protocol. Continue PT/ OT. Need rehab facility. SLP evaluation - high risk for aspiration, DG esophagus study. Outpatient neurology follow-up.  Left ankle swelling, pain- Swelling persists. This is limiting her working with PT OT. X-rays reviewed and negative for fracture dislocations. Continue pain control.  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   Essential hypertension Continue blood pressure control with losartan and metoprolol.   Atrial fibrillation (HCC) Status post Watchman procedure.  Plan to continue dual antiplatelet therapy with aspirin and clopidogrel for 21 days followed by Plavix therapy. Rate control with metoprolol.  Continue telemetry montoring.   Rheumatoid arthritis (HCC) Continue with hydroxychloroquine.   Peripheral vascular disease (HCC) Continue blood pressure control, antiplatelet therapy and statin.   Hypothyroidism Continue with levothyroxine.   Chronic pain syndrome Resume pain control with oxycodone.  Continue divalproex PT and OT   Recent H pylori treatment- Has reflux symptoms. High risk for aspiration. DG esophagus study per SLP. Continue PPI BID.  Hypokalemia Resolved. Supplement as needed  Obesity, class 1 Calculated BMI is 33.6      Out of bed to chair. Incentive spirometry. Nursing supportive care. Fall, aspiration precautions. DVT prophylaxis   Code Status: Full Code Subjective: Patient is  seen and examined today morning.  Patient's daughter at bedside  states she is better. Patient still has left ankle pain. Able to move all extremities. SLP noted choking episode while eating with severe reflux.  Physical Exam: Vitals:   12/04/23 0437 12/04/23 0934 12/04/23 1103 12/04/23 1315  BP: (!) 134/91 (!) 124/100 108/73 101/72  Pulse: 75 86 86 94  Resp: 18 20 15 20   Temp: (!) 97.4 F (36.3 C) 98.5 F (36.9 C)  98.6 F (37 C)  TempSrc:      SpO2: 96% 99% 97% 95%  Weight:      Height:        General - Elderly obese Caucasian female, distress secondary to pain HEENT - PERRLA, EOMI, atraumatic head, non tender sinuses. Lung - Clear, basal rales, rhonchi, no wheezes. Heart - S1, S2 heard, no murmurs, rubs, left greater than right pedal edema. Abdomen - Soft, non tender, bowel sounds good Neuro - Alert, awake and oriented x 3, left lower extremity weak Skin - Warm and dry.  Left ankle tenderness, decreased range of motion  Data Reviewed:      Latest Ref Rng & Units 12/01/2023    8:01 AM 08/14/2023    1:14 PM 06/14/2023   12:27 PM  CBC  WBC 4.0 - 10.5 K/uL 6.8  9.6  6.9   Hemoglobin 12.0 - 15.0 g/dL 40.9  81.1  91.4   Hematocrit 36.0 - 46.0 % 37.4  35.1  36.1   Platelets 150 - 400 K/uL 196  190.0  207.0       Latest Ref Rng & Units 12/03/2023    4:12 AM 12/01/2023    8:01 AM 02/02/2023    9:42 AM  BMP  Glucose 70 - 99 mg/dL 782  956  213   BUN 8 - 23 mg/dL 9  11  14    Creatinine 0.44 - 1.00 mg/dL 0.86  5.78  4.69   BUN/Creat Ratio 12 - 28   16   Sodium 135 - 145 mmol/L 136  141  139   Potassium 3.5 - 5.1 mmol/L 4.0  3.4  4.5   Chloride 98 - 111 mmol/L 102  105  101   CO2 22 - 32 mmol/L 25  27  24    Calcium 8.9 - 10.3 mg/dL 8.9  9.1  9.1    ECHOCARDIOGRAM COMPLETE Result Date: 12/03/2023    ECHOCARDIOGRAM REPORT   Patient Name:   Carmen [name] Date of Exam: 12/03/2023 Medical Rec #:  [id number]    Height:       60.0 in Accession #:    [id number]   Weight:       172.2 lb Date of Birth:  02-11-1941    BSA:          1.751 m Patient Age:    82  years     BP:           165/99 mmHg Patient Gender: F            HR:           93 bpm. Exam Location:  [facility] Procedure: 2D Echo, Cardiac Doppler and Color Doppler (Both Spectral and Color            Flow Doppler were utilized during procedure). Indications:     TIA G45.9  History:         Patient has prior history of Echocardiogram examinations, most  recent 12/28/2022. Stroke and TIA; Risk Factors:Hypertension.  Sonographer:     Cristela Blue Referring Phys:  [id number] Francoise Schaumann [place] Diagnosing Phys: Lorine Bears MD IMPRESSIONS  1. Left ventricular ejection fraction, by estimation, is 55 to 60%. The left ventricle has normal function. The left ventricle has no regional wall motion abnormalities. There is moderate left ventricular hypertrophy. Left ventricular diastolic parameters are indeterminate.  2. Right ventricular systolic function is normal. The right ventricular size is normal. There is mildly elevated pulmonary artery systolic pressure.  3. Left atrial size was severely dilated.  4. Right atrial size was mildly dilated.  5. The mitral valve is normal in structure. Moderate mitral valve regurgitation. No evidence of mitral stenosis.  6. The aortic valve is normal in structure. Aortic valve regurgitation is not visualized. No aortic stenosis is present.  7. The inferior vena cava is normal in size with greater than 50% respiratory variability, suggesting right atrial pressure of 3 mmHg. FINDINGS  Left Ventricle: Left ventricular ejection fraction, by estimation, is 55 to 60%. The left ventricle has normal function. The left ventricle has no regional wall motion abnormalities. The left ventricular internal cavity size was normal in size. There is  moderate left ventricular hypertrophy. Left ventricular diastolic parameters are indeterminate. Right Ventricle: The right ventricular size is normal. No increase in right ventricular wall thickness. Right ventricular systolic function is normal. There is  mildly elevated pulmonary artery systolic pressure. The tricuspid regurgitant velocity is 2.95  m/s, and with an assumed right atrial pressure of 5 mmHg, the estimated right ventricular systolic pressure is 39.8 mmHg. Left Atrium: Left atrial size was severely dilated. Right Atrium: Right atrial size was mildly dilated. Pericardium: There is no evidence of pericardial effusion. Mitral Valve: The mitral valve is normal in structure. Moderate mitral valve regurgitation. No evidence of mitral valve stenosis. Tricuspid Valve: The tricuspid valve is normal in structure. Tricuspid valve regurgitation is trivial. No evidence of tricuspid stenosis. Aortic Valve: The aortic valve is normal in structure. Aortic valve regurgitation is not visualized. No aortic stenosis is present. Aortic valve mean gradient measures 3.0 mmHg. Aortic valve peak gradient measures 4.2 mmHg. Aortic valve area, by VTI measures 2.16 cm. Pulmonic Valve: The pulmonic valve was normal in structure. Pulmonic valve regurgitation is not visualized. No evidence of pulmonic stenosis. Aorta: The aortic root is normal in size and structure. Venous: The inferior vena cava is normal in size with greater than 50% respiratory variability, suggesting right atrial pressure of 3 mmHg. IAS/Shunts: No atrial level shunt detected by color flow Doppler.  LEFT VENTRICLE PLAX 2D LVIDd:         3.40 cm LVIDs:         2.30 cm LV PW:         1.20 cm LV IVS:        1.60 cm LVOT diam:     2.00 cm LV SV:         36 LV SV Index:   21 LVOT Area:     3.14 cm  RIGHT VENTRICLE RV Basal diam:  3.20 cm RV Mid diam:    2.60 cm LEFT ATRIUM             Index        RIGHT ATRIUM           Index LA diam:        5.30 cm 3.03 cm/m   RA Area:     21.60 cm LA  Vol [facility]):   68.7 ml 39.22 ml/m  RA Volume:   55.10 ml  31.46 ml/m LA Vol (A4C):   42.5 ml 24.27 ml/m LA Biplane Vol: 52.9 ml 30.20 ml/m  AORTIC VALVE AV Area (Vmax):    2.05 cm AV Area (Vmean):   1.85 cm AV Area (VTI):     2.16  cm AV Vmax:           103.00 cm/s AV Vmean:          76.400 cm/s AV VTI:            0.167 m AV Peak Grad:      4.2 mmHg AV Mean Grad:      3.0 mmHg LVOT Vmax:         67.10 cm/s LVOT Vmean:        45.100 cm/s LVOT VTI:          0.115 m LVOT/AV VTI ratio: 0.69  AORTA Ao Root diam: 3.00 cm MITRAL VALVE                TRICUSPID VALVE MV Area (PHT): 8.43 cm     TR Peak grad:   34.8 mmHg MV Decel Time: 90 msec      TR Vmax:        295.00 cm/s MV E velocity: 124.00 cm/s                             SHUNTS                             Systemic VTI:  0.12 m                             Systemic Diam: 2.00 cm Lorine Bears MD Electronically signed by Lorine Bears MD Signature Date/Time: 12/03/2023/1:01:51 PM    Final    Family Communication: Discussed with daughter at bedside, she understand and agree. All questions answereed.  Disposition: Status is: changed to Inpatient Remains inpatient appropriate because: stroke work up, safe dc plan.  Planned Discharge Destination: Rehab     Time spent: 38 minutes  Author: Marcelino Duster, MD 12/04/2023 1:38 PM Secure chat 7am to 7pm For on call review [email or web address].

## 2023-12-05 DIAGNOSIS — I25810 Atherosclerosis of coronary artery bypass graft(s) without angina pectoris: Secondary | ICD-10-CM | POA: Diagnosis not present

## 2023-12-05 DIAGNOSIS — I10 Essential (primary) hypertension: Secondary | ICD-10-CM | POA: Diagnosis not present

## 2023-12-05 DIAGNOSIS — I4811 Longstanding persistent atrial fibrillation: Secondary | ICD-10-CM | POA: Diagnosis not present

## 2023-12-05 DIAGNOSIS — I639 Cerebral infarction, unspecified: Secondary | ICD-10-CM | POA: Diagnosis not present

## 2023-12-05 MED ORDER — IBUPROFEN 400 MG PO TABS
400.0000 mg | ORAL_TABLET | Freq: Once | ORAL | Status: DC
  Filled 2023-12-05: qty 1

## 2023-12-05 MED ORDER — DOCUSATE SODIUM 100 MG PO CAPS
100.0000 mg | ORAL_CAPSULE | Freq: Two times a day (BID) | ORAL | Status: DC
  Administered 2023-12-05 – 2023-12-07 (×5): 100 mg via ORAL
  Filled 2023-12-05 (×5): qty 1

## 2023-12-05 MED ORDER — SENNA 8.6 MG PO TABS
2.0000 | ORAL_TABLET | Freq: Every day | ORAL | Status: DC
  Administered 2023-12-05 – 2023-12-06 (×2): 17.2 mg via ORAL
  Filled 2023-12-05 (×2): qty 2

## 2023-12-05 MED ORDER — POLYETHYLENE GLYCOL 3350 17 G PO PACK
17.0000 g | PACK | Freq: Every day | ORAL | Status: DC
  Administered 2023-12-05 – 2023-12-07 (×3): 17 g via ORAL
  Filled 2023-12-05 (×3): qty 1

## 2023-12-05 MED ORDER — HYDROCODONE-ACETAMINOPHEN 5-325 MG PO TABS
1.0000 | ORAL_TABLET | ORAL | Status: DC | PRN
  Administered 2023-12-05 – 2023-12-07 (×4): 1 via ORAL
  Filled 2023-12-05 (×4): qty 1

## 2023-12-05 NOTE — Progress Notes (Signed)
 Occupational Therapy Treatment Patient Details Name: Carmen [name] MRN: [id number] DOB: [birth year] Today's Date: 12/05/2023   History of present illness Pt presented to ER secondary to L-sided weakness; admitted for TIA/CVA work-up.  MRI significant for R ACA infarct (R cingulate gyrus)   OT comments  Chart reviewed to date, pt greeted in bed with daughter present, alert and oriented x4, agreeable to OT tx session with encouragement. Tx session targeted improving functional activity tolerance in preparation for ADL performance. Improvements noted throughout with pt performing supine>sit with MOD A, good sitting balance noted. STS completed multiple attempts with MIN-MOD A +1 with RW, frequent vcs for technique. Short amb transfer to bedside chair with MIN-MOD A with RW. Pt with posterior lean in standing. SET UP for grooming tasks. Pt is making progress towards goals, discharge remains appropriate. OT will continue to follow.       If plan is discharge home, recommend the following:  A lot of help with walking and/or transfers;A little help with walking and/or transfers;Help with stairs or ramp for entrance;Assist for transportation   Equipment Recommendations  Other (comment) (defer)    Recommendations for Other Services      Precautions / Restrictions Precautions Precautions: Fall Recall of Precautions/Restrictions: Impaired Restrictions Weight Bearing Restrictions Per Provider Order: No       Mobility Bed Mobility Overal bed mobility: Needs Assistance Bed Mobility: Supine to Sit     Supine to sit: Mod assist, HOB elevated, Used rails     General bed mobility comments: frequent vcs for technique    Transfers Overall transfer level: Needs assistance Equipment used: Rolling walker (2 wheels) Transfers: Sit to/from Stand Sit to Stand: Min assist, Mod assist           General transfer comment: from bedside chair 3 attempts with frequent vcs for technique, carry over  noted throughout attempts; pt with posterior pushing/lean throughout; pt stands for 15-20 seconds each attempt     Balance Overall balance assessment: Needs assistance Sitting-balance support: Feet supported, No upper extremity supported Sitting balance-[name]: Good   Postural control: Posterior lean Standing balance support: Bilateral upper extremity supported, Reliant on assistive device for balance, During functional activity Standing balance-[name]: Poor                             ADL either performed or assessed with clinical judgement   ADL Overall ADL's : Needs assistance/impaired     Grooming: Wash/dry face;Sitting;Set up               Lower Body Dressing: Maximal assistance   Toilet Transfer: Minimal assistance;Moderate assistance;Rolling walker (2 wheels) Toilet Transfer Details (indicate cue type and reason): simulated, step pivot to bedside chair, frequent vcs for technique                Extremity/Trunk Assessment Upper Extremity Assessment Upper Extremity Assessment: Overall WFL for tasks assessed;Generalized weakness (mildly weak grip strength bilaterally)   Lower Extremity Assessment Lower Extremity Assessment: Generalized weakness        Vision       Perception     Praxis     Communication Communication Communication: No apparent difficulties Factors Affecting Communication: Hearing impaired   Cognition Arousal: Alert Behavior During Therapy: WFL for tasks assessed/performed, Flat affect Cognition: No apparent impairments             OT - Cognition Comments: required encoruagement for participation  Following commands: Intact        Cueing   Cueing Techniques: Verbal cues  Exercises Other Exercises Other Exercises: edu pt and daugther re: role of OT, role of rehab    Shoulder Instructions       General Comments vss throughout, intermittent dizziness with sustained standing,  improved with seated rest breaks    Pertinent Vitals/ Pain       Pain Assessment Pain Assessment: 0-10 Pain Score: 7  Pain Location: LLE Pain Descriptors / Indicators: Discomfort, Aching Pain Intervention(s): Limited activity within patient's tolerance, Monitored during session, Premedicated before session, Repositioned  Home Living                                          Prior Functioning/Environment              Frequency  Min 1X/week        Progress Toward Goals  OT Goals(current goals can now be found in the care plan section)  Progress towards OT goals: Progressing toward goals  Acute Rehab OT Goals Time For Goal Achievement: 12/17/23  Plan      Co-evaluation                 AM-PAC OT "6 Clicks" Daily Activity     Outcome Measure   Help from another person eating meals?: None Help from another person taking care of personal grooming?: A Little Help from another person toileting, which includes using toliet, bedpan, or urinal?: A Lot Help from another person bathing (including washing, rinsing, drying)?: A Lot Help from another person to put on and taking off regular upper body clothing?: A Little Help from another person to put on and taking off regular lower body clothing?: A Lot 6 Click Score: 16    End of Session Equipment Utilized During Treatment: Gait belt;Rolling walker (2 wheels)  OT Visit Diagnosis: Other abnormalities of gait and mobility (R26.89);Pain Pain - Right/Left: Left Pain - part of body: Knee;Hip;Ankle and joints of foot;Leg   Activity Tolerance Patient tolerated treatment well   Patient Left in chair;with call bell/phone within reach;with chair alarm set;with family/visitor present   Nurse Communication Mobility status        Time: 9604-5409 OT Time Calculation (min): 27 min  Charges: OT General Charges $OT Visit: 1 Visit OT Treatments $Self Care/Home Management : 8-22 mins $Therapeutic  Activity: 8-22 mins  Oleta Mouse, OTD OTR/L  12/05/23, 11:38 AM

## 2023-12-05 NOTE — Progress Notes (Addendum)
 Physical Therapy Treatment Patient Details Name: Carmen [name] MRN: [id number] DOB: [birth year] Today's Date: 12/05/2023   History of Present Illness Pt presented to ER secondary to L-sided weakness; admitted for TIA/CVA work-up.  MRI significant for R ACA infarct (R cingulate gyrus)    PT Comments  Pt was sitting in recliner upon arrival. She is A and agreeable but eager to return to bed form recliner. Supportive daughter present and very helpful throughout limited session. Pt was able to stand from recliner with mod assist. Min assist form elevated bed height. Pt cued for fwd wt shift throughout all transfers and stand > sit. Overall pt tolerated ambulation ~ 40 ft but with slow antalgic gait. No LOB however once fatigued required more assistance due to LLE pain. Dc recs remain appropriate to maximize independence and safety with all ADLs.    If plan is discharge home, recommend the following: A lot of help with walking and/or transfers;A lot of help with bathing/dressing/bathroom;Assist for transportation;Help with stairs or ramp for entrance     Equipment Recommendations  Rolling walker (2 wheels);BSC/3in1       Precautions / Restrictions Precautions Precautions: Fall Recall of Precautions/Restrictions: Impaired Restrictions Weight Bearing Restrictions Per Provider Order: No     Mobility  Bed Mobility Overal bed mobility: Needs Assistance Bed Mobility: Sit to Supine  Sit to supine: Mod assist, Min assist General bed mobility comments: min-mod assist to progress BLEs into bed    Transfers Overall transfer level: Needs assistance Equipment used: Rolling walker (2 wheels) Transfers: Sit to/from Stand Sit to Stand: Min assist, Mod assist, From elevated surface  General transfer comment: Min assist to stand from elevated EOB surface. Mod assist from lower recliner surface    Ambulation/Gait Ambulation/Gait assistance: Min assist Gait Distance (Feet): 40 Feet Assistive device:  Rolling walker (2 wheels) Gait Pattern/deviations: Step-to pattern, Trunk flexed Gait velocity: decreased  General Gait Details: Pt ambulated ~ 40 ft with RW with step to pattern. Needs constant encouragement + does have some unsteadiness once fatigue and c/o increased LLE pain   Balance Overall balance assessment: Needs assistance Sitting-balance support: Feet supported, No upper extremity supported Sitting balance-[name]: Good     Standing balance support: Bilateral upper extremity supported Standing balance-[name]: Poor      Communication Communication Communication: No apparent difficulties Factors Affecting Communication: Hearing impaired  Cognition Arousal: Alert Behavior During Therapy: WFL for tasks assessed/performed   PT - Cognitive impairments: No apparent impairments    PT - Cognition Comments: Pt is A but O x 3. Does lack some overall awareness of current situation. Supportive daughter present and very involved Following commands: Intact      Cueing Cueing Techniques: Verbal cues     General Comments General comments (skin integrity, edema, etc.): vss throughout, intermittent dizziness with sustained standing, improved with seated rest breaks      Pertinent Vitals/Pain Pain Assessment Pain Assessment: 0-10 Pain Score: 6  Pain Location: LLE Pain Descriptors / Indicators: Discomfort, Aching Pain Intervention(s): Limited activity within patient's tolerance, Monitored during session, Premedicated before session, Repositioned     PT Goals (current goals can now be found in the care plan section) Acute Rehab PT Goals Patient Stated Goal: to return home Progress towards PT goals: Progressing toward goals    Frequency    Min 1X/week       Co-evaluation     PT goals addressed during session: Mobility/safety with mobility;Proper use of DME  AM-PAC PT "6 Clicks" Mobility   Outcome Measure  Help needed turning from your back to your  side while in a flat bed without using bedrails?: None Help needed moving from lying on your back to sitting on the side of a flat bed without using bedrails?: A Lot Help needed moving to and from a bed to a chair (including a wheelchair)?: A Lot Help needed standing up from a chair using your arms (e.g., wheelchair or bedside chair)?: A Lot Help needed to walk in hospital room?: A Lot Help needed climbing 3-5 steps with a railing? : Total 6 Click Score: 13    End of Session   Activity Tolerance: Patient tolerated treatment well;Patient limited by fatigue Patient left: in bed;with call bell/phone within reach;with bed alarm set Nurse Communication: Mobility status PT Visit Diagnosis: Muscle weakness (generalized) (M62.81);Difficulty in walking, not elsewhere classified (R26.2)     Time: 1610-9604 PT Time Calculation (min) (ACUTE ONLY): 14 min  Charges:    $Gait Training: 8-22 mins PT General Charges $$ ACUTE PT VISIT: 1 Visit                    Jetta Lout PTA 12/05/23, 1:07 PM

## 2023-12-05 NOTE — [place] FL2 (Signed)
 [place] MEDICAID FL2 LEVEL OF CARE FORM     IDENTIFICATION  Patient Name: Carmen [name] Birthdate: 11/15/1940 Sex: female Admission Date (Current Location): 12/01/2023  [facility] and [place] Number:  Chiropodist and Address:  [facility], [address], [place], [place] [address]      Provider Number: [id number]  Attending Physician Name and Address:  Marcelino Duster, MD  Relative Name and Phone Number:       Current Level of Care: Hospital Recommended Level of Care: Skilled Nursing Facility Prior Approval Number:    Date Approved/Denied:   PASRR Number: [id number]  Discharge Plan: SNF    Current Diagnoses: Patient Active Problem List   Diagnosis Date Noted   Acute stroke due to ischemia (HCC) 12/03/2023   Left ankle swelling 12/03/2023   Obesity, class 1 12/02/2023   CVA (cerebral vascular accident) (HCC) 12/01/2023   Rheumatoid arthritis (HCC) 12/01/2023   H. 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   Right sided weakness 10/20/2022   Pedal edema 10/20/2022   DOE (dyspnea on exertion) 10/20/2022   DNR (do not resuscitate) 10/14/2022   Syncope 09/21/2022   Osteoarthritis of hip (Right) 08/22/2022   Vitamin B12 deficiency 08/01/2022   Chronic hip pain (Right) 06/29/2022   Greater trochanteric bursitis (Right) 06/29/2022   Chronic lower extremity pain (Right) 06/29/2022   Walker as ambulation aid 06/29/2022   Hypokalemia 06/23/2022   Unsteady gait when walking 06/23/2022   Varicose veins of both lower extremities with pain 06/22/2022   Obesity, Class II, BMI 35-39.9 06/13/2022   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   Hypomagnesemia 05/18/2022   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   ICAO (internal  carotid artery occlusion), left 05/17/2022   Stenosis of extracranial carotid artery, right 05/17/2022   Prolonged QT interval 05/08/2022   Insomnia 04/14/2022   Levoscoliosis of lumbar spine 01/26/2022   Lumbosacral foraminal stenosis (Multilevel) (Bilateral) 01/26/2022   Degenerative lateral spondylolisthesis of L4/L5 (3mm) 01/26/2022   Coronary artery disease involving coronary bypass graft of native heart without angina pectoris 12/08/2021   Anxiety 10/18/2021   DDD (degenerative disc disease), lumbosacral 05/31/2021   Lumbar facet syndrome (Bilateral) 05/17/2021   Lumbosacral facet hypertrophy (Multilevel) (Bilateral) 05/17/2021   Lumbar facet arthropathy (Multilevel) (Bilateral) 05/17/2021   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/17/2021   Cervical facet syndrome (Bilateral) 05/17/2021   Cervical facet hypertrophy (Multilevel) (Bilateral) 05/17/2021   Spondylosis without myelopathy or radiculopathy, cervical region 05/17/2021   Spondylosis without myelopathy or radiculopathy, lumbosacral region 05/17/2021   Other intervertebral disc degeneration, lumbar region 05/17/2021   Chronic cervical radiculopathy (Bilateral) 05/02/2021   DDD (degenerative disc disease), cervical 05/02/2021   Foraminal stenosis of cervical region 05/02/2021   Vitamin D deficiency 04/30/2021   Osteopenia determined by x-ray 04/30/2021   Lumbar compression fracture (Old) (Multilevel), sequela 04/30/2021   Chronic pain syndrome 03/14/2021   Disorder of skeletal system 03/14/2021   Chronic neck pain (2ry area of Pain) (Bilateral) (L>R) 03/14/2021   Chronic lower extremity pain (3ry area of Pain) (Bilateral) (R>L) 03/14/2021   Chronic hand pain (Left) 03/14/2021   Chronic hand pain (Right) 03/14/2021   Chronic low back pain (1ry area of Pain) (Bilateral) (R>L) w/o sciatica 02/17/2021   Essential hypertension 11/26/2020   Paroxysmal atrial fibrillation (HCC) 11/18/2020  Peripheral vascular  disease (HCC) 10/06/2020   Depression, unspecified 10/02/2020   Hypothyroidism 10/02/2020   Hyperlipidemia LDL goal <70 10/02/2020   Dorsalgia, unspecified 10/02/2020   History of stroke 10/02/2020   Chronic anticoagulation (Eliquis) 10/02/2020   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   Presence of coronary angioplasty implant and graft 10/02/2020    Orientation RESPIRATION BLADDER Height & Weight     Self, Time, Situation, Place  Normal External catheter Weight: 171 lb 1.2 oz (77.6 kg) Height:  5' (152.4 cm)  BEHAVIORAL SYMPTOMS/MOOD NEUROLOGICAL BOWEL NUTRITION STATUS      Continent Diet (see d/c summary)  AMBULATORY STATUS COMMUNICATION OF NEEDS Skin   Extensive Assist Verbally Normal                       Personal Care Assistance Level of Assistance  Bathing, Feeding, Dressing Bathing Assistance: Maximum assistance Feeding assistance: Independent Dressing Assistance: Limited assistance     Functional Limitations Info  Sight, Hearing, Speech Sight Info: Impaired Hearing Info: Impaired Speech Info: Adequate    SPECIAL CARE FACTORS FREQUENCY  PT (By licensed PT), OT (By licensed OT)     PT Frequency: 5x/week OT Frequency: 5x/week            Contractures Contractures Info: Not present    Additional Factors Info  Code Status, Allergies Code Status Info: Full code Allergies Info: no known allergies           Current Medications (12/05/2023):  This is the current hospital active medication list Current Facility-Administered Medications  Medication Dose Route Frequency Provider Last Rate Last Admin   acetaminophen (TYLENOL) tablet 650 mg  650 mg Oral Q6H PRN Arrien, York Ram, MD   650 mg at 12/03/23 1447   aspirin EC tablet 81 mg  81 mg Oral Daily Jefferson Fuel, MD   81 mg at 12/05/23 1034   atorvastatin (LIPITOR) tablet 40 mg  40 mg Oral QHS Coralie Keens, MD   40 mg at 12/04/23 2142   clopidogrel (PLAVIX) tablet 75 mg  75  mg Oral Daily Jefferson Fuel, MD   75 mg at 12/05/23 1034   cyanocobalamin (VITAMIN B12) tablet 500 mcg  500 mcg Oral Daily Coralie Keens, MD   500 mcg at 12/05/23 1035   divalproex (DEPAKOTE) DR tablet 500 mg  500 mg Oral QHS Arrien, York Ram, MD   500 mg at 12/04/23 2142   docusate sodium (COLACE) capsule 100 mg  100 mg Oral BID Marcelino Duster, MD   100 mg at 12/05/23 1034   enoxaparin (LOVENOX) injection 40 mg  40 mg Subcutaneous Q24H Floydene Flock, MD   40 mg at 12/05/23 1357   feeding supplement (ENSURE ENLIVE / ENSURE PLUS) liquid 237 mL  237 mL Oral BID BM Marcelino Duster, MD   237 mL at 12/05/23 1358   HYDROcodone-acetaminophen (NORCO/VICODIN) 5-325 MG per tablet 1 tablet  1 tablet Oral Q4H PRN Marcelino Duster, MD       hydroxychloroquine (PLAQUENIL) tablet 200 mg  200 mg Oral BID Arrien, York Ram, MD   200 mg at 12/05/23 1035   ibuprofen (ADVIL) tablet 400 mg  400 mg Oral Once Marcelino Duster, MD       levothyroxine (SYNTHROID) tablet 137 mcg  137 mcg Oral QAC breakfast Coralie Keens, MD   137 mcg at 12/05/23 0552   loratadine (CLARITIN) tablet 10 mg  10 mg Oral Daily Arrien, York Ram,  MD   10 mg at 12/05/23 1035   losartan (COZAAR) tablet 12.5 mg  12.5 mg Oral Daily Arrien, York Ram, MD   12.5 mg at 12/05/23 1034   metoprolol succinate (TOPROL-XL) 24 hr tablet 50 mg  50 mg Oral Daily Arrien, York Ram, MD   50 mg at 12/05/23 1035   pantoprazole (PROTONIX) EC tablet 40 mg  40 mg Oral BID Coralie Keens, MD   40 mg at 12/05/23 1035   polyethylene glycol (MIRALAX / GLYCOLAX) packet 17 g  17 g Oral Daily Marcelino Duster, MD   17 g at 12/05/23 1035   senna (SENOKOT) tablet 17.2 mg  2 tablet Oral QHS Marcelino Duster, MD       traZODone (DESYREL) tablet 100 mg  100 mg Oral QHS Marcelino Duster, MD   100 mg at 12/04/23 2142     Discharge Medications: Please see discharge summary for a  list of discharge medications.  Relevant Imaging Results:  Relevant Lab Results:   Additional Information SSN [id number] [place], [place]

## 2023-12-05 NOTE — Progress Notes (Signed)
 Progress Note   Patient: Carmen [name] ZOX:[id number] DOB: 1941/06/11 DOA: 12/01/2023     2 DOS: the patient was seen and examined on 12/05/2023   Brief hospital course: Carmen [name] was admitted to the hospital with the working diagnosis of acute CVA.   83 yo female with the past medical history of atrial fibrillation, hypertension, hyperlipidemia, coronary artery disease, and history of CVA who presented with a mechanical fall. Patient was noted to have difficulty ambulating, requiring more assistance than usual for the last 2 days. She was noted to have positive weakness, predominantly on the left side, resulting in multiple mechanical falls. On her initial physical examination her blood pressure was 158/83, HR 87 RR 21 and 02 saturation 96%, lungs with no wheezing or rales, heart with S1 and S2 present and regular, abdomen with no distention and no lower extremity edema. Neurologically not focal.   CT head and neck with no acute intracranial changes or cervical spine injury.  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  Sable moderate narrowing of the supraclinoid left ICA.  Stable atherosclerotic changed at the right carotid bifurcation and right cavernous internal carotid artery without significant stenosis.  MRI of the brain showed punctate foci of restricted diffusion along the margin of previous right ACA territory infarct involving right cingulate gyrus consistent with small acute infarct.  Neurology advised DAPT for 21 days followed by Plavix therapy. PT/ OT advised SNF placement.   Assessment and Plan: Acute CVA (cerebral vascular accident) (HCC) MRI of the brain showed punctate foci of restricted diffusion along the margin of previous right ACA territory infarct involving right cingulate gyrus consistent with small acute infarct. Echocardiogram showed EF 55 to 60%, left atrial size  dilation. Neurology advised dual antiplatelet therapy aspirin, Plavix for 21 days followed by Plavix daily after that. Continue statin therapy.  Neuro checks as per floor protocol. Continue PT/ OT. Need rehab facility. SLP evaluation - high risk for aspiration given her reflux symptoms, recent H. Pylori.  Outpatient neurology follow-up.  Left ankle swelling, pain- Swelling persists. This is limiting her working with PT OT. X-rays reviewed and negative for fracture dislocations. Continue pain control. Oxy changed to home dose oral norco.  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   Essential hypertension Continue blood pressure control with losartan and metoprolol.   Atrial fibrillation (HCC) Status post Watchman procedure.  Plan to continue dual antiplatelet therapy with aspirin and clopidogrel for 21 days followed by Plavix therapy. Continue metoprolol.   Rheumatoid arthritis (HCC) Continue with hydroxychloroquine.   Peripheral vascular disease (HCC) Continue blood pressure control, antiplatelet therapy and statin.   Hypothyroidism Continue with levothyroxine.   Chronic pain syndrome Resume pain control with oxycodone.  Continue divalproex PT and OT follow up.  Recent H pylori treatment- Has reflux symptoms. High risk for aspiration. DG esophagus study per SLP as outpatient. Continue PPI BID.  Hypokalemia Resolved. Supplement as needed  Obesity, class 1 Calculated BMI is 33.6      Out of bed to chair. Incentive spirometry. Nursing supportive care. Fall, aspiration precautions. DVT prophylaxis   Code Status: Full Code Subjective: Patient is seen and examined today morning.  Patient has left ankle pain. Eating fair. Worked with PT. Daughter at bedside asked about her medications.  Physical Exam: Vitals:  12/05/23 0033 12/05/23  0037 12/05/23 0400 12/05/23 0734  BP:  119/64 112/63 (!) 135/95  Pulse:  95 87 82  Resp: (!) 24 20  18   Temp:  98.5 F (36.9 C) 98.3 F (36.8 C) 98.5 F (36.9 C)  TempSrc:   Oral Oral  SpO2:  95% 96% 100%  Weight:   77.6 kg   Height:        General - Elderly obese Caucasian female, mild distress secondary to pain HEENT - PERRLA, EOMI, atraumatic head, non tender sinuses. Lung - Clear, basal rales, rhonchi, no wheezes. Heart - S1, S2 heard, no murmurs, rubs, left greater than right pedal edema. Abdomen - Soft, non tender, bowel sounds good Neuro - Alert, awake and oriented x 3, non focal Skin - Warm and dry.  Left ankle tenderness, decreased range of motion  Data Reviewed:      Latest Ref Rng & Units 12/01/2023    8:01 AM 08/14/2023    1:14 PM 06/14/2023   12:27 PM  CBC  WBC 4.0 - 10.5 K/uL 6.8  9.6  6.9   Hemoglobin 12.0 - 15.0 g/dL 40.9  81.1  91.4   Hematocrit 36.0 - 46.0 % 37.4  35.1  36.1   Platelets 150 - 400 K/uL 196  190.0  207.0       Latest Ref Rng & Units 12/03/2023    4:12 AM 12/01/2023    8:01 AM 02/02/2023    9:42 AM  BMP  Glucose 70 - 99 mg/dL 782  956  213   BUN 8 - 23 mg/dL 9  11  14    Creatinine 0.44 - 1.00 mg/dL 0.86  5.78  4.69   BUN/Creat Ratio 12 - 28   16   Sodium 135 - 145 mmol/L 136  141  139   Potassium 3.5 - 5.1 mmol/L 4.0  3.4  4.5   Chloride 98 - 111 mmol/L 102  105  101   CO2 22 - 32 mmol/L 25  27  24    Calcium 8.9 - 10.3 mg/dL 8.9  9.1  9.1    No results found.  Family Communication: Discussed with patient, daughter at bedside, they understand and agree. All questions answereed.  Disposition: Status is: changed to Inpatient Remains inpatient appropriate because: safe dc plan.  Planned Discharge Destination: Rehab     Time spent: 39 minutes  Author: Marcelino Duster, MD 12/05/2023 3:43 PM Secure chat 7am to 7pm For on call review [email or web address].

## 2023-12-05 NOTE — Plan of Care (Signed)

## 2023-12-05 NOTE — TOC Progression Note (Signed)
 Transition of Care [facility]) - Progression Note    Patient Details  Name: Carmen [name] MRN: [id number] Date of Birth: 03/08/1941  [facility] CM/SW Contact  Erin Sons, [place] Phone Number: 12/05/2023, 4:01 PM  Clinical Narrative:     CSW met with pt bedside to discuss snf rec; included daughter on speaker phone. CSW explained SNF rec and workup process. Pt and daughter are agreeable to SNF w/u; would like a facility close to their home in [place]. Fl2 completed and bed requests sent in hub.                Social Determinants of Health (SDOH) Interventions SDOH Screenings   Food Insecurity: No Food Insecurity (12/01/2023)  Housing: High Risk (12/02/2023)  Transportation Needs: No Transportation Needs (12/01/2023)  Utilities: Not At Risk (12/01/2023)  Alcohol Screen: Low Risk  (11/18/2020)  Depression (PHQ2-9): Low Risk  (10/17/2023)  Recent Concern: Depression (PHQ2-9) - Medium Risk (08/20/2023)  Financial Resource Strain: Low Risk  (08/20/2023)  Physical Activity: Insufficiently Active (08/20/2023)  Social Connections: Moderately Isolated (12/01/2023)  Stress: No Stress Concern Present (08/20/2023)  Tobacco Use: Medium Risk (12/01/2023)  Health Literacy: Patient Unable To Answer (08/20/2023)    Readmission Risk Interventions     No data to display

## 2023-12-06 DIAGNOSIS — I10 Essential (primary) hypertension: Secondary | ICD-10-CM | POA: Diagnosis not present

## 2023-12-06 DIAGNOSIS — I4811 Longstanding persistent atrial fibrillation: Secondary | ICD-10-CM | POA: Diagnosis not present

## 2023-12-06 DIAGNOSIS — I639 Cerebral infarction, unspecified: Secondary | ICD-10-CM | POA: Diagnosis not present

## 2023-12-06 DIAGNOSIS — I25810 Atherosclerosis of coronary artery bypass graft(s) without angina pectoris: Secondary | ICD-10-CM | POA: Diagnosis not present

## 2023-12-06 NOTE — Progress Notes (Signed)
 Occupational Therapy Treatment Patient Details Name: Carmen [name] MRN: [id number] DOB: 09-18-1941 Today's Date: 12/06/2023   History of present illness Pt presented to ER secondary to L-sided weakness; admitted for TIA/CVA work-up.  MRI significant for R ACA infarct (R cingulate gyrus)   OT comments  Pt seen for OT tx. Pt in recliner, shifted down, endorsing back pain and ready to return to bed. Pt required MOD A for repositioning in the chair to prevent sliding out of the recliner and for standing and completing step pivot to EOB with multimodal cues for feet positioning, hand placement, and preventing posterior LOB. Once returned to bed pt requesting assist for changing her pad. MOD A to complete with pt engaging in rolling to assist. Pt endorsing slight improvement in back pain once returned to bed. All needs in reach. Pt continues to benefit from skilled OT services to maximize return to PLOF.       If plan is discharge home, recommend the following:  A lot of help with walking and/or transfers;A little help with walking and/or transfers;Help with stairs or ramp for entrance;Assist for transportation   Equipment Recommendations  Other (comment) (defer)    Recommendations for Other Services      Precautions / Restrictions Precautions Precautions: Fall Recall of Precautions/Restrictions: Impaired Restrictions Weight Bearing Restrictions Per Provider Order: No       Mobility Bed Mobility Overal bed mobility: Needs Assistance Bed Mobility: Rolling, Sit to Supine Rolling: Min assist     Sit to supine: Max assist        Transfers Overall transfer level: Needs assistance Equipment used: Rolling walker (2 wheels) Transfers: Sit to/from Stand Sit to Stand: Mod assist           General transfer comment: multi modal cues for BLE positioning and anterior lean to prevent posterior LOB, for hand placement     Balance Overall balance assessment: Needs  assistance Sitting-balance support: No upper extremity supported, Feet supported Sitting balance-[name]: Fair   Postural control: Posterior lean Standing balance support: Bilateral upper extremity supported, Reliant on assistive device for balance Standing balance-[name]: Poor Standing balance comment: initial posterior lean in standing requiring mod assist initially to gain standing balance with cueing and assist (use of RW)                           ADL either performed or assessed with clinical judgement   ADL                               Toileting- Clothing Manipulation and Hygiene: Bed level;Moderate assistance Toileting - Clothing Manipulation Details (indicate cue type and reason): MOD A for replacing soiled pad in mesh underwear     Functional mobility during ADLs: Moderate assistance;Rolling walker (2 wheels)      Extremity/Trunk Assessment              Vision       Perception     Praxis     Communication Communication Communication: Impaired Factors Affecting Communication: Hearing impaired   Cognition Arousal: Alert Behavior During Therapy: WFL for tasks assessed/performed Cognition: No apparent impairments                               Following commands: Impaired Following commands impaired: Follows one step commands with increased time  Cueing   Cueing Techniques: Verbal cues, Visual cues, Tactile cues  Exercises      Shoulder Instructions       General Comments      Pertinent Vitals/ Pain       Pain Assessment Pain Assessment: 0-10 Pain Score: 7  Pain Location: back, LLE Pain Descriptors / Indicators: Discomfort, Aching, Grimacing, Guarding Pain Intervention(s): Limited activity within patient's tolerance, Monitored during session, Premedicated before session, Repositioned  Home Living                                          Prior Functioning/Environment               Frequency  Min 1X/week        Progress Toward Goals  OT Goals(current goals can now be found in the care plan section)  Progress towards OT goals: Progressing toward goals  Acute Rehab OT Goals Patient Stated Goal: get better OT Goal Formulation: With patient Time For Goal Achievement: 12/17/23 Potential to Achieve Goals: Good  Plan      Co-evaluation                 AM-PAC OT "6 Clicks" Daily Activity     Outcome Measure   Help from another person eating meals?: None Help from another person taking care of personal grooming?: A Little Help from another person toileting, which includes using toliet, bedpan, or urinal?: A Lot Help from another person bathing (including washing, rinsing, drying)?: A Lot Help from another person to put on and taking off regular upper body clothing?: A Little Help from another person to put on and taking off regular lower body clothing?: A Lot 6 Click Score: 16    End of Session Equipment Utilized During Treatment: Rolling walker (2 wheels)  OT Visit Diagnosis: Other abnormalities of gait and mobility (R26.89);Pain Pain - Right/Left: Left Pain - part of body: Knee;Hip;Ankle and joints of foot;Leg   Activity Tolerance Patient limited by pain   Patient Left in bed;with call bell/phone within reach;with bed alarm set   Nurse Communication          Time: 0865-7846 OT Time Calculation (min): 16 min  Charges: OT General Charges $OT Visit: 1 Visit OT Treatments $Self Care/Home Management : 8-22 mins  Arman Filter., MPH, MS, OTR/L ascom [phone] 12/06/23, 12:42 PM

## 2023-12-06 NOTE — Progress Notes (Signed)
 Progress Note   Patient: Carmen [name] RUE:[id number] DOB: [birth year] DOA: 12/01/2023     3 DOS: the patient was seen and examined on 12/06/2023   Brief hospital course: Carmen [name] is a 83 yo female with the past medical history of atrial fibrillation, hypertension, hyperlipidemia, coronary artery disease, and history of CVA who presented with a mechanical fall. Patient was noted to have difficulty ambulating, requiring more assistance than usual for the last 2 days. She was noted to have positive weakness, predominantly on the left side, resulting in multiple mechanical falls. On her initial physical examination her blood pressure was 158/83, HR 87 RR 21 and 02 saturation 96%, lungs with no wheezing or rales, heart with S1 and S2 present and regular, abdomen with no distention and no lower extremity edema. Neurologically not focal.   CT head and neck with no acute intracranial changes or cervical spine injury.  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  Sable moderate narrowing of the supraclinoid left ICA.  Stable atherosclerotic changed at the right carotid bifurcation and right cavernous internal carotid artery without significant stenosis.  MRI of the brain showed punctate foci of restricted diffusion along the margin of previous right ACA territory infarct involving right cingulate gyrus consistent with small acute infarct.  Neurology advised DAPT for 21 days followed by Plavix therapy. PT/ OT advised SNF placement.   Assessment and Plan: Acute CVA (cerebral vascular accident) (HCC) MRI of the brain showed punctate foci of restricted diffusion along the margin of previous right ACA territory infarct involving right cingulate gyrus consistent with small acute infarct. Echocardiogram showed EF 55 to 60%, left atrial size dilation. Neurology advised dual antiplatelet therapy aspirin, Plavix  for 21 days followed by Plavix daily after that. Continue statin therapy.  Neuro checks as per floor protocol. SLP evaluation - high risk for aspiration given her reflux symptoms, recent H. Pylori. Outpatient GI follow up Outpatient neurology follow-up. Continue PT/ OT. TOC working on rehab facility.  Left ankle swelling, pain- Pain better today, sitting in chair. X-rays negative for fracture dislocations. Continue pain control with home dose norco.  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   Essential hypertension Continue blood pressure control with losartan and metoprolol.   Atrial fibrillation (HCC) Status post Watchman procedure.  Plan to continue dual antiplatelet therapy with aspirin and clopidogrel for 21 days followed by Plavix therapy. Continue metoprolol.   Rheumatoid arthritis (HCC) Continue with hydroxychloroquine.   Peripheral vascular disease (HCC) Continue blood pressure control, antiplatelet therapy and statin.   Hypothyroidism Continue with levothyroxine.   Chronic pain syndrome Resume pain control with oxycodone.  Continue divalproex PT/ OT.  Recent H pylori treatment- Has reflux symptoms. High risk for aspiration. DG esophagus study per SLP as outpatient. Continue PPI BID.  Hypokalemia Resolved. Supplement as needed  Obesity, class 1 Calculated BMI is 33.6  Diet, exercise and weight reduction advised   Out of bed to chair. Incentive spirometry. Nursing supportive care. Fall, aspiration precautions. DVT prophylaxis   Code Status: Full Code  Subjective: Patient is seen and examined today morning.  She worked with PT, able to sit in chair.feels little better today.  Physical Exam: Vitals:   12/05/23 1927 12/06/23 0400 12/06/23 0754 12/06/23 1544  BP: 121/76 120/79 (!) 156/81 114/68  Pulse:  79 84 80 87  Resp:  18 17 18 16   Temp: 98.6 F (37 C) 97.9 F (36.6 C) 97.8 F (36.6 C) 98.5 F (36.9 C)  TempSrc: Oral Oral    SpO2: 95% 95% 98% 99%  Weight:      Height:        General - Elderly obese Caucasian female, no apparent distress HEENT - PERRLA, EOMI, atraumatic head, non tender sinuses. Lung - Clear, basal rales, rhonchi, no wheezes. Heart - S1, S2 heard, no murmurs, rubs, left greater than right pedal edema. Abdomen - Soft, non tender, bowel sounds good Neuro - Alert, awake and oriented x 3, non focal Skin - Warm and dry.  Left ankle swelling improved  Data Reviewed:      Latest Ref Rng & Units 12/01/2023    8:01 AM 08/14/2023    1:14 PM 06/14/2023   12:27 PM  CBC  WBC 4.0 - 10.5 K/uL 6.8  9.6  6.9   Hemoglobin 12.0 - 15.0 g/dL 16.1  09.6  04.5   Hematocrit 36.0 - 46.0 % 37.4  35.1  36.1   Platelets 150 - 400 K/uL 196  190.0  207.0       Latest Ref Rng & Units 12/03/2023    4:12 AM 12/01/2023    8:01 AM 02/02/2023    9:42 AM  BMP  Glucose 70 - 99 mg/dL 409  811  914   BUN 8 - 23 mg/dL 9  11  14    Creatinine 0.44 - 1.00 mg/dL 7.82  9.56  2.13   BUN/Creat Ratio 12 - 28   16   Sodium 135 - 145 mmol/L 136  141  139   Potassium 3.5 - 5.1 mmol/L 4.0  3.4  4.5   Chloride 98 - 111 mmol/L 102  105  101   CO2 22 - 32 mmol/L 25  27  24    Calcium 8.9 - 10.3 mg/dL 8.9  9.1  9.1    No results found.  Family Communication: Discussed with patient, daughter at bedside, they understand and agree. All questions answereed.  Disposition: Status is: changed to Inpatient Remains inpatient appropriate because: Placement  Planned Discharge Destination: Rehab     Time spent: 37 minutes  Author: Marcelino Duster, MD 12/06/2023 4:53 PM Secure chat 7am to 7pm For on call review [email or web address].

## 2023-12-06 NOTE — Progress Notes (Signed)
 Physical Therapy Treatment Patient Details Name: Carmen [name] MRN: [id number] DOB: [birth year] Today's Date: 12/06/2023   History of Present Illness Pt presented to ER secondary to L-sided weakness; admitted for TIA/CVA work-up.  MRI significant for R ACA infarct (R cingulate gyrus)    PT Comments  Pt resting in bed upon PT arrival just finishing breakfast; pt's daughter present; pt agreeable to therapy.  7/10 headache reported at rest beginning of session and 7/10 L ankle pain reported end of session at rest (nurse notified; pt with recent pain medication).  During session pt mod assist semi-supine to sitting EOB; mod assist to stand up to RW (pt appearing fearful of falling and with posterior lean requiring increased cueing and assist for upright balance); and CGA to min assist to ambulate approximately 50 feet with RW use (pt with antalgic gait d/t L ankle pain).  Will continue to focus on strengthening, balance, and progressive functional mobility during hospitalization.   If plan is discharge home, recommend the following: A lot of help with walking and/or transfers;A lot of help with bathing/dressing/bathroom;Assist for transportation;Help with stairs or ramp for entrance   Can travel by private vehicle      No  Equipment Recommendations  Rolling walker (2 wheels);BSC/3in1    Recommendations for Other Services       Precautions / Restrictions Precautions Precautions: Fall Recall of Precautions/Restrictions: Impaired Restrictions Weight Bearing Restrictions Per Provider Order: No     Mobility  Bed Mobility Overal bed mobility: Needs Assistance Bed Mobility: Supine to Sit     Supine to sit: Mod assist, HOB elevated, Used rails     General bed mobility comments: assist for trunk; vc's for technique    Transfers Overall transfer level: Needs assistance Equipment used: Rolling walker (2 wheels) Transfers: Sit to/from Stand Sit to Stand: Mod assist           General  transfer comment: mod assist to stand from bed x1 trial and from reclienr x1 trial; vc's for UE/LE placement and overall technique; assist to control descent sitting    Ambulation/Gait Ambulation/Gait assistance: Contact guard assist, Min assist Gait Distance (Feet): 50 Feet Assistive device: Rolling walker (2 wheels) Gait Pattern/deviations: Step-through pattern, Decreased step length - right, Decreased step length - left, Antalgic Gait velocity: decreased     General Gait Details: decreased stance time L LE; intermittent assist to steady   Stairs             Wheelchair Mobility     Tilt Bed    Modified Rankin (Stroke Patients Only)       Balance Overall balance assessment: Needs assistance Sitting-balance support: No upper extremity supported, Feet supported Sitting balance-[name]: Good Sitting balance - Comments: steady reaching within BOS   Standing balance support: Bilateral upper extremity supported, Reliant on assistive device for balance Standing balance-[name]: Poor Standing balance comment: initial posterior lean in standing requiring min to mod assist initially to gain standing balance with cueing and assist (use of RW)                            Communication Communication Communication: Impaired Factors Affecting Communication: Hearing impaired (Hearing aide R ear)  Cognition Arousal: Alert Behavior During Therapy: WFL for tasks assessed/performed                           PT - Cognition Comments: Pt A&O to  at least person and place; pt did not remember walking with therapy yesterday; pt appearing anxious with activity (fearful of falling) Following commands: Intact      Cueing Cueing Techniques: Verbal cues  Exercises      General Comments        Pertinent Vitals/Pain Pain Assessment Pain Assessment: 0-10 Pain Score: 7  Pain Location: L ankle Pain Descriptors / Indicators: Discomfort, Aching Pain  Intervention(s): Limited activity within patient's tolerance, Monitored during session, Premedicated before session, Repositioned, Other (comment) (Nurse updated on pt's pain status) Vitals (HR and SpO2 on room air) stable throughout treatment session.    Home Living                          Prior Function            PT Goals (current goals can now be found in the care plan section) Acute Rehab PT Goals Patient Stated Goal: to return home PT Goal Formulation: With patient Time For Goal Achievement: 12/16/23 Potential to Achieve Goals: Fair Progress towards PT goals: Progressing toward goals    Frequency    Min 2X/week (Frequency of care updated based on Delivery of Care Model)      PT Plan      Co-evaluation              AM-PAC PT "6 Clicks" Mobility   Outcome Measure  Help needed turning from your back to your side while in a flat bed without using bedrails?: None Help needed moving from lying on your back to sitting on the side of a flat bed without using bedrails?: A Lot Help needed moving to and from a bed to a chair (including a wheelchair)?: A Lot Help needed standing up from a chair using your arms (e.g., wheelchair or bedside chair)?: A Lot Help needed to walk in hospital room?: A Little Help needed climbing 3-5 steps with a railing? : Total 6 Click Score: 14    End of Session Equipment Utilized During Treatment: Gait belt Activity Tolerance: Patient tolerated treatment well Patient left: in chair;with call bell/phone within reach;with chair alarm set;with family/visitor present Nurse Communication: Mobility status;Precautions;Other (comment) (Pt's pain status) PT Visit Diagnosis: Muscle weakness (generalized) (M62.81);Difficulty in walking, not elsewhere classified (R26.2)     Time: 1308-6578 PT Time Calculation (min) (ACUTE ONLY): 21 min  Charges:    $Gait Training: 8-22 mins PT General Charges $$ ACUTE PT VISIT: 1 Visit                      Glynn Freas, PT 12/06/23, 10:00 AM

## 2023-12-06 NOTE — TOC Progression Note (Signed)
 Transition of Care [facility]) - Progression Note    Patient Details  Name: [name] MRN: [id number] Date of Birth: 08-Nov-1940  Transition of [facility]) CM/SW Contact  Erin Sons, [place] Phone Number: 12/06/2023, 3:42 PM  Clinical Narrative:     CSW called pt's daughter and provided SNF bed offers. Daughter chooses [facility]. CSW explains insurance auth process. Pt's daughter would transport at [place].  CSW submitted insurance auth request in online portal. Documents show as uploaded but no auth# is generated.   CSW resubmitted [name]; Ref# [id number] Auth is pending                 Social Determinants of Health (SDOH) Interventions SDOH Screenings   Food Insecurity: No Food Insecurity (12/01/2023)  Housing: High Risk (12/02/2023)  Transportation Needs: No Transportation Needs (12/01/2023)  Utilities: Not At Risk (12/01/2023)  Alcohol Screen: Low Risk  (11/18/2020)  Depression (PHQ2-9): Low Risk  (10/17/2023)  Recent Concern: Depression (PHQ2-9) - Medium Risk (08/20/2023)  Financial Resource Strain: Low Risk  (08/20/2023)  Physical Activity: Insufficiently Active (08/20/2023)  Social Connections: Moderately Isolated (12/01/2023)  Stress: No Stress Concern Present (08/20/2023)  Tobacco Use: Medium Risk (12/01/2023)  Health Literacy: Patient Unable To Answer (08/20/2023)    Readmission Risk Interventions     No data to display

## 2023-12-06 NOTE — Plan of Care (Signed)

## 2023-12-07 DIAGNOSIS — W19XXXA Unspecified fall, initial encounter: Secondary | ICD-10-CM | POA: Diagnosis not present

## 2023-12-07 DIAGNOSIS — I10 Essential (primary) hypertension: Secondary | ICD-10-CM | POA: Diagnosis not present

## 2023-12-07 DIAGNOSIS — M25572 Pain in left ankle and joints of left foot: Secondary | ICD-10-CM

## 2023-12-07 DIAGNOSIS — Z79899 Other long term (current) drug therapy: Secondary | ICD-10-CM | POA: Diagnosis not present

## 2023-12-07 DIAGNOSIS — M069 Rheumatoid arthritis, unspecified: Secondary | ICD-10-CM | POA: Diagnosis not present

## 2023-12-07 DIAGNOSIS — I639 Cerebral infarction, unspecified: Secondary | ICD-10-CM | POA: Diagnosis not present

## 2023-12-07 DIAGNOSIS — S0990XA Unspecified injury of head, initial encounter: Secondary | ICD-10-CM | POA: Diagnosis not present

## 2023-12-07 DIAGNOSIS — S0083XA Contusion of other part of head, initial encounter: Secondary | ICD-10-CM | POA: Diagnosis not present

## 2023-12-07 DIAGNOSIS — I517 Cardiomegaly: Secondary | ICD-10-CM | POA: Diagnosis not present

## 2023-12-07 DIAGNOSIS — I25810 Atherosclerosis of coronary artery bypass graft(s) without angina pectoris: Secondary | ICD-10-CM | POA: Diagnosis not present

## 2023-12-07 DIAGNOSIS — S0003XA Contusion of scalp, initial encounter: Secondary | ICD-10-CM | POA: Diagnosis not present

## 2023-12-07 DIAGNOSIS — W0110XA Fall on same level from slipping, tripping and stumbling with subsequent striking against unspecified object, initial encounter: Secondary | ICD-10-CM | POA: Diagnosis not present

## 2023-12-07 DIAGNOSIS — R41841 Cognitive communication deficit: Secondary | ICD-10-CM | POA: Diagnosis not present

## 2023-12-07 DIAGNOSIS — Z7982 Long term (current) use of aspirin: Secondary | ICD-10-CM | POA: Diagnosis not present

## 2023-12-07 DIAGNOSIS — Z8673 Personal history of transient ischemic attack (TIA), and cerebral infarction without residual deficits: Secondary | ICD-10-CM | POA: Diagnosis not present

## 2023-12-07 DIAGNOSIS — M6281 Muscle weakness (generalized): Secondary | ICD-10-CM | POA: Diagnosis not present

## 2023-12-07 DIAGNOSIS — I739 Peripheral vascular disease, unspecified: Secondary | ICD-10-CM | POA: Diagnosis not present

## 2023-12-07 DIAGNOSIS — M25511 Pain in right shoulder: Secondary | ICD-10-CM | POA: Diagnosis not present

## 2023-12-07 DIAGNOSIS — Z043 Encounter for examination and observation following other accident: Secondary | ICD-10-CM | POA: Diagnosis not present

## 2023-12-07 DIAGNOSIS — S8001XA Contusion of right knee, initial encounter: Secondary | ICD-10-CM | POA: Diagnosis not present

## 2023-12-07 DIAGNOSIS — S5012XA Contusion of left forearm, initial encounter: Secondary | ICD-10-CM | POA: Diagnosis not present

## 2023-12-07 DIAGNOSIS — I6782 Cerebral ischemia: Secondary | ICD-10-CM | POA: Diagnosis not present

## 2023-12-07 DIAGNOSIS — R296 Repeated falls: Secondary | ICD-10-CM | POA: Diagnosis not present

## 2023-12-07 DIAGNOSIS — M79621 Pain in right upper arm: Secondary | ICD-10-CM | POA: Diagnosis not present

## 2023-12-07 DIAGNOSIS — M4989 Spondylopathy in diseases classified elsewhere, multiple sites in spine: Secondary | ICD-10-CM | POA: Diagnosis not present

## 2023-12-07 DIAGNOSIS — Y92002 Bathroom of unspecified non-institutional (private) residence single-family (private) house as the place of occurrence of the external cause: Secondary | ICD-10-CM | POA: Diagnosis not present

## 2023-12-07 DIAGNOSIS — R278 Other lack of coordination: Secondary | ICD-10-CM | POA: Diagnosis not present

## 2023-12-07 DIAGNOSIS — M1711 Unilateral primary osteoarthritis, right knee: Secondary | ICD-10-CM | POA: Diagnosis not present

## 2023-12-07 DIAGNOSIS — M6259 Muscle wasting and atrophy, not elsewhere classified, multiple sites: Secondary | ICD-10-CM | POA: Diagnosis not present

## 2023-12-07 DIAGNOSIS — Z7902 Long term (current) use of antithrombotics/antiplatelets: Secondary | ICD-10-CM | POA: Diagnosis not present

## 2023-12-07 DIAGNOSIS — I4891 Unspecified atrial fibrillation: Secondary | ICD-10-CM | POA: Diagnosis not present

## 2023-12-07 DIAGNOSIS — E785 Hyperlipidemia, unspecified: Secondary | ICD-10-CM | POA: Diagnosis not present

## 2023-12-07 DIAGNOSIS — I4811 Longstanding persistent atrial fibrillation: Secondary | ICD-10-CM | POA: Diagnosis not present

## 2023-12-07 MED ORDER — HYDROCODONE-ACETAMINOPHEN 5-325 MG PO TABS: 1.0000 | ORAL_TABLET | Freq: Two times a day (BID) | ORAL | 0 refills | Status: DC | PRN

## 2023-12-07 MED ORDER — LOSARTAN POTASSIUM 25 MG PO TABS: 25.0000 mg | ORAL_TABLET | Freq: Every day | ORAL | 3 refills | Status: DC

## 2023-12-07 MED ORDER — LOSARTAN POTASSIUM 25 MG PO TABS: 25.0000 mg | ORAL_TABLET | Freq: Every day | ORAL | 2 refills | Status: DC

## 2023-12-07 MED ORDER — ASPIRIN 81 MG PO TBEC: 81.0000 mg | DELAYED_RELEASE_TABLET | Freq: Every day | ORAL | 0 refills | Status: AC

## 2023-12-07 MED ORDER — POLYETHYLENE GLYCOL 3350 17 G PO PACK: 17.0000 g | PACK | Freq: Every day | ORAL | 0 refills | Status: DC | PRN

## 2023-12-07 NOTE — Discharge Summary (Addendum)
 Physician Discharge Summary   Patient: Carmen [name] MRN: [id number] DOB: July 05, 1941  Admit date:     12/01/2023  Discharge date: 12/07/23  Discharge Physician: Marcelino Duster   PCP: Mort Sawyers, FNP   Recommendations at discharge:    PCP follow up in 1 week. Cardiology, neurology follow up as scheduled. GI follow-up as scheduled for recent H. pylori management.  Discharge Diagnoses: Principal Problem:   Acute stroke due to ischemia [facility]) Active Problems:   Coronary artery disease involving coronary bypass graft of native heart without angina pectoris   Essential hypertension   Atrial fibrillation (HCC)   Rheumatoid arthritis (HCC)   Peripheral vascular disease (HCC)   Hypothyroidism   Chronic pain syndrome   Hypokalemia   Obesity, class 1   Left ankle swelling  Resolved Problems:   * No resolved hospital problems. *  Hospital Course: Carmen [name] is a 83 yo female with the past medical history of atrial fibrillation, hypertension, hyperlipidemia, coronary artery disease, and history of CVA who presented with a mechanical fall. Patient was noted to have difficulty ambulating, requiring more assistance than usual for the last 2 days. She was noted to have positive weakness, predominantly on the left side, resulting in multiple mechanical falls. On her initial physical examination her blood pressure was 158/83, HR 87 RR 21 and 02 saturation 96%, lungs with no wheezing or rales, heart with S1 and S2 present and regular, abdomen with no distention and no lower extremity edema. Neurologically not focal.    CT head and neck with no acute intracranial changes or cervical spine injury.  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  Sable moderate narrowing of the supraclinoid left ICA.  Stable atherosclerotic changed at the right carotid bifurcation and right cavernous internal  carotid artery without significant stenosis.  MRI of the brain showed punctate foci of restricted diffusion along the margin of previous right ACA territory infarct involving right cingulate gyrus consistent with small acute infarct.   Neurology advised DAPT for 21 days followed by Plavix therapy. PT/ OT advised SNF placement.  Patient is hemodynamically stable to be discharged to skilled nursing facility.  Her BP elevated, losartan dose increased to 25 mg daily.  Patient recently had H. pylori treatment and has severe reflux symptoms, will need GI follow-up as scheduled.  Advised her to follow-up with PCP, GI, neurology and cardiology upon discharge as instructed.  Patient and daughter understand agree with the discharge plan.  Assessment and Plan: Acute CVA (cerebral vascular accident) (HCC) MRI of the brain showed punctate foci of restricted diffusion along the margin of previous right ACA territory infarct involving right cingulate gyrus consistent with small acute infarct. Echocardiogram showed EF 55 to 60%, left atrial size dilation. Neurology advised dual antiplatelet therapy aspirin, Plavix for 21 days followed by Plavix daily after that. Continue statin therapy.   SLP evaluation - high risk for aspiration given her reflux symptoms, recent H. Pylori. Outpatient GI follow up Outpatient neurology and cardiology follow-up suggested   Left ankle swelling, pain- S/p fall. X-rays negative for fracture dislocations. Pain improved. Continue norco for severe pain. Advised to follow pain clinic as scheduled.   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    Essential hypertension Continue blood pressure elevated, increased losartan to 25mg . continue metoprolol.  Atrial fibrillation (HCC) Status post Watchman procedure.  Plan to continue dual antiplatelet therapy  with aspirin and clopidogrel for 21 days followed by Plavix therapy. Continue metoprolol.    Rheumatoid arthritis (HCC) Continue with hydroxychloroquine.    Peripheral vascular disease (HCC) Continue blood pressure control, antiplatelet therapy and statin.    Hypothyroidism Continue with levothyroxine.    Chronic pain syndrome Continue divalproex, norco Pain clinic follow up.   Recent H pylori treatment- Has reflux symptoms.  DG esophagus study per SLP as outpatient. Continue PPI BID. GI follow up as outpatient.   Hypokalemia Resolved.   Obesity, class 1 Calculated BMI is 33.41. Diet, exercise and weight reduction advised.       Consultants: Neurology Procedures performed: None Disposition: Skilled nursing facility Diet recommendation:  Discharge Diet Orders (From admission, onward)     Start     Ordered   12/07/23 0000  Diet - low sodium heart healthy        12/07/23 1210           Cardiac diet DISCHARGE MEDICATION: Allergies as of 12/07/2023   No Known Allergies      Medication List     TAKE these medications    aspirin EC 81 MG tablet Take 1 tablet (81 mg total) by mouth daily for 15 days. Swallow whole. Start taking on: December 08, 2023   atorvastatin 40 MG tablet Commonly known as: LIPITOR Take 1 tablet (40 mg total) by mouth at bedtime.   CALCIUM PO Take 1 tablet by mouth daily.   clopidogrel 75 MG tablet Commonly known as: Plavix Take 1 tablet (75 mg total) by mouth daily.   cyanocobalamin 500 MCG tablet Commonly known as: VITAMIN B12 Take 500 mcg by mouth daily.   diphenhydrAMINE 25 MG tablet Commonly known as: BENADRYL Take 25 mg by mouth at bedtime as needed for allergies.   divalproex 500 MG DR tablet Commonly known as: DEPAKOTE Take 1 tablet (500 mg total) by mouth at bedtime.   furosemide 20 MG tablet Commonly known as: LASIX Take 1 tablet (20 mg total) by mouth daily as needed (for swelling).    HYDROcodone-acetaminophen 5-325 MG tablet Commonly known as: NORCO/VICODIN Take 1 tablet by mouth 2 (two) times daily as needed for up to 7 days for severe pain (pain score 7-10). Must last 30 days. What changed: Another medication with the same name was removed. Continue taking this medication, and follow the directions you see here.   hydroxychloroquine 200 MG tablet Commonly known as: PLAQUENIL Take 200 mg by mouth 2 (two) times daily.   levothyroxine 137 MCG tablet Commonly known as: SYNTHROID Take 1 tablet (137 mcg total) by mouth daily before breakfast.   loratadine 10 MG tablet Commonly known as: CLARITIN Take 1 tablet (10 mg total) by mouth daily.   losartan 25 MG tablet Commonly known as: COZAAR Take 1 tablet (25 mg total) by mouth daily. What changed:  how much to take how to take this when to take this additional instructions   magnesium oxide 400 (240 Mg) MG tablet Commonly known as: MAG-OX Take 400 mg by mouth daily.   metoprolol succinate 100 MG 24 hr tablet Commonly known as: TOPROL-XL Take 0.5 tablets (50 mg total) by mouth daily. Take with or immediately following a meal.   naloxone 4 MG/0.1ML Liqd nasal spray kit Commonly known as: NARCAN Place 1 spray into the nose as needed for up to 365 doses (for opioid-induced respiratory depresssion). In case of  emergency (overdose), spray once into each nostril. If no response within 3 minutes, repeat application and call 911.   pantoprazole 40 MG tablet Commonly known as: PROTONIX Take 1 tablet (40 mg total) by mouth 2 (two) times daily.   Pepcid AC 10 MG tablet Generic drug: famotidine Take 10 mg by mouth daily as needed for heartburn or indigestion.   polyethylene glycol 17 g packet Commonly known as: MIRALAX / GLYCOLAX Take 17 g by mouth daily as needed.   traZODone 100 MG tablet Commonly known as: DESYREL TAKE 1 TABLET BY MOUTH AT BEDTIME AS NEEDED FOR SLEEP   Vitamin D-3 25 MCG (1000 UT)  Caps Take 1,000 Units by mouth daily.        Contact information for follow-up providers     Mort Sawyers, FNP Follow up.   Specialty: Family Medicine Why: Hospital follow up Contact information: [address] [place] [address] [phone]         Micki Riley, MD Follow up in 2 month(s).   Specialties: Neurology, Radiology Contact information: [address] [place] [address] [phone]         Yvonne Kendall, MD Follow up in 1 month(s).   Specialty: Cardiology Contact information: [address] [place] [address] [phone]              Contact information for after-discharge care     Destination     [facility] Preferred SNF .   Service: Skilled Nursing Contact information: [address] [place] [address] [phone]                    Discharge Exam: Filed Weights   12/01/23 0754 12/01/23 2216 12/05/23 0400  Weight: 76.2 kg 78.1 kg 77.6 kg      12/07/2023    7:56 AM 12/07/2023    4:33 AM 12/07/2023   12:00 AM  Vitals with BMI  Systolic 153 164 595  Diastolic 81 89 88  Pulse 79 44 79    General - Elderly obese Caucasian female, no apparent distress HEENT - PERRLA, EOMI, atraumatic head, non tender sinuses. Lung - Clear, basal rales, rhonchi, no wheezes. Heart - S1, S2 heard, no murmurs, rubs, trace pedal edema. Abdomen - Soft, non tender, bowel sounds good Neuro - Alert, awake and oriented x 3, left lower extremity weak Skin - Warm and dry.  Left ankle tenderness improved.  Condition at discharge: stable  The results of significant diagnostics from this hospitalization (including imaging, microbiology, ancillary and laboratory) are listed below for reference.   Imaging Studies: ECHOCARDIOGRAM COMPLETE Result Date: 12/03/2023    ECHOCARDIOGRAM REPORT   Patient Name:   Carmen [name] Date of Exam: 12/03/2023 Medical Rec #:   [id number]    Height:       60.0 in Accession #:    [id number]   Weight:       172.2 lb Date of Birth:  09-22-1941    BSA:          1.751 m Patient Age:    82 years     BP:           165/99 mmHg Patient Gender: F            HR:           93 bpm. Exam Location:  [facility] Procedure: 2D Echo, Cardiac Doppler and Color Doppler (Both Spectral and  Color            Flow Doppler were utilized during procedure). Indications:     TIA G45.9  History:         Patient has prior history of Echocardiogram examinations, most                  recent 12/28/2022. Stroke and TIA; Risk Factors:Hypertension.  Sonographer:     Cristela Blue Referring Phys:  [id number] [place] Diagnosing Phys: Lorine Bears MD IMPRESSIONS  1. Left ventricular ejection fraction, by estimation, is 55 to 60%. The left ventricle has normal function. The left ventricle has no regional wall motion abnormalities. There is moderate left ventricular hypertrophy. Left ventricular diastolic parameters are indeterminate.  2. Right ventricular systolic function is normal. The right ventricular size is normal. There is mildly elevated pulmonary artery systolic pressure.  3. Left atrial size was severely dilated.  4. Right atrial size was mildly dilated.  5. The mitral valve is normal in structure. Moderate mitral valve regurgitation. No evidence of mitral stenosis.  6. The aortic valve is normal in structure. Aortic valve regurgitation is not visualized. No aortic stenosis is present.  7. The inferior vena cava is normal in size with greater than 50% respiratory variability, suggesting right atrial pressure of 3 mmHg. FINDINGS  Left Ventricle: Left ventricular ejection fraction, by estimation, is 55 to 60%. The left ventricle has normal function. The left ventricle has no regional wall motion abnormalities. The left ventricular internal cavity size was normal in size. There is  moderate left ventricular hypertrophy. Left ventricular diastolic parameters are indeterminate.  Right Ventricle: The right ventricular size is normal. No increase in right ventricular wall thickness. Right ventricular systolic function is normal. There is mildly elevated pulmonary artery systolic pressure. The tricuspid regurgitant velocity is 2.95  m/s, and with an assumed right atrial pressure of 5 mmHg, the estimated right ventricular systolic pressure is 39.8 mmHg. Left Atrium: Left atrial size was severely dilated. Right Atrium: Right atrial size was mildly dilated. Pericardium: There is no evidence of pericardial effusion. Mitral Valve: The mitral valve is normal in structure. Moderate mitral valve regurgitation. No evidence of mitral valve stenosis. Tricuspid Valve: The tricuspid valve is normal in structure. Tricuspid valve regurgitation is trivial. No evidence of tricuspid stenosis. Aortic Valve: The aortic valve is normal in structure. Aortic valve regurgitation is not visualized. No aortic stenosis is present. Aortic valve mean gradient measures 3.0 mmHg. Aortic valve peak gradient measures 4.2 mmHg. Aortic valve area, by VTI measures 2.16 cm. Pulmonic Valve: The pulmonic valve was normal in structure. Pulmonic valve regurgitation is not visualized. No evidence of pulmonic stenosis. Aorta: The aortic root is normal in size and structure. Venous: The inferior vena cava is normal in size with greater than 50% respiratory variability, suggesting right atrial pressure of 3 mmHg. IAS/Shunts: No atrial level shunt detected by color flow Doppler.  LEFT VENTRICLE PLAX 2D LVIDd:         3.40 cm LVIDs:         2.30 cm LV PW:         1.20 cm LV IVS:        1.60 cm LVOT diam:     2.00 cm LV SV:         36 LV SV Index:   21 LVOT Area:     3.14 cm  RIGHT VENTRICLE RV Basal diam:  3.20 cm RV Mid diam:    2.60  cm LEFT ATRIUM             Index        RIGHT ATRIUM           Index LA diam:        5.30 cm 3.03 cm/m   RA Area:     21.60 cm LA Vol (A2C):   68.7 ml 39.22 ml/m  RA Volume:   55.10 ml  31.46 ml/m LA  Vol (A4C):   42.5 ml 24.27 ml/m LA Biplane Vol: 52.9 ml 30.20 ml/m  AORTIC VALVE AV Area (Vmax):    2.05 cm AV Area (Vmean):   1.85 cm AV Area (VTI):     2.16 cm AV Vmax:           103.00 cm/s AV Vmean:          76.400 cm/s AV VTI:            0.167 m AV Peak Grad:      4.2 mmHg AV Mean Grad:      3.0 mmHg LVOT Vmax:         67.10 cm/s LVOT Vmean:        45.100 cm/s LVOT VTI:          0.115 m LVOT/AV VTI ratio: 0.69  AORTA Ao Root diam: 3.00 cm MITRAL VALVE                TRICUSPID VALVE MV Area (PHT): 8.43 cm     TR Peak grad:   34.8 mmHg MV Decel Time: 90 msec      TR Vmax:        295.00 cm/s MV E velocity: 124.00 cm/s                             SHUNTS                             Systemic VTI:  0.12 m                             Systemic Diam: 2.00 cm Lorine Bears MD Electronically signed by Lorine Bears MD Signature Date/Time: 12/03/2023/1:01:51 PM    Final    DG Ankle 2 Views Left Result Date: 12/02/2023 CLINICAL DATA:  Left lower extremity pain extending from the hip into the ankle. EXAM: LEFT ANKLE - 2 VIEW; LEFT KNEE - COMPLETE 4+ VIEW COMPARISON:  None Available. FINDINGS: Left knee: The bones appear mildly demineralized. No evidence of acute fracture or dislocation. The joint spaces are largely preserved. 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 No evidence of significant joint effusion. Vascular calcifications and vascular clips are noted. Left ankle: The bones appear mildly demineralized. No evidence of acute fracture or dislocation. The joint spaces are preserved. Possible mild soft tissue edema distally without evidence of foreign body. IMPRESSION: No acute osseous findings or significant arthropathic changes in the left knee or ankle. 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 Possible mild nonspecific edema distally. Electronically Signed   By: Carey Bullocks M.D.   On: 12/02/2023 14:33   DG Knee Complete 4 Views Left Result Date: 12/02/2023 CLINICAL DATA:  Left lower extremity pain extending  from the hip into the ankle. EXAM: LEFT ANKLE - 2 VIEW; LEFT KNEE - COMPLETE 4+ VIEW COMPARISON:  None Available. FINDINGS: Left knee: The bones appear mildly  demineralized. No evidence of acute fracture or dislocation. The joint spaces are largely preserved. 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 No evidence of significant joint effusion. Vascular calcifications and vascular clips are noted. Left ankle: The bones appear mildly demineralized. No evidence of acute fracture or dislocation. The joint spaces are preserved. Possible mild soft tissue edema distally without evidence of foreign body. IMPRESSION: No acute osseous findings or significant arthropathic changes in the left knee or ankle. 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 Possible mild nonspecific edema distally. Electronically Signed   By: Carey Bullocks M.D.   On: 12/02/2023 14:33   DG HIP UNILAT WITH PELVIS 2-3 VIEWS LEFT Result Date: 12/02/2023 CLINICAL DATA:  Left hip pain. EXAM: DG HIP (WITH OR WITHOUT PELVIS) 2-3V LEFT COMPARISON:  Hip radiographs dated 03/14/2021. FINDINGS: There is no evidence of hip fracture or dislocation. Mild bilateral hip osteoarthritis and moderate degenerative changes in the spine. IMPRESSION: No acute osseous injury. Electronically Signed   By: Romona Curls M.D.   On: 12/02/2023 14:31   CT ANGIO HEAD NECK W WO CM Result Date: 12/01/2023 CLINICAL DATA:  Multiple falls. Headache. Extension of right ACA territory infarct. EXAM: CT ANGIOGRAPHY HEAD AND NECK WITH AND WITHOUT CONTRAST TECHNIQUE: Multidetector CT imaging of the head and neck was performed using the standard protocol during bolus administration of intravenous contrast. Multiplanar CT image reconstructions and MIPs were obtained to evaluate the vascular anatomy. Carotid stenosis measurements (when applicable) are obtained utilizing NASCET criteria, using the distal internal carotid diameter as the denominator. RADIATION DOSE REDUCTION: This exam was performed  according to the departmental dose-optimization program which includes automated exposure control, adjustment of the mA and/or kV according to patient size and/or use of iterative reconstruction technique. CONTRAST:  75mL OMNIPAQUE IOHEXOL 350 MG/ML SOLN COMPARISON:  CT angio head and neck 10/14/2022 FINDINGS: CTA NECK FINDINGS Aortic arch: Atherosclerotic changes are present at the aortic arch and great vessel origins without significant stenosis or interval change. Right carotid system: The right common carotid artery is within normal limits. Atherosclerotic calcifications at the right carotid bifurcation are stable. No focal stenosis is present. The cervical right ICA is otherwise normal. Left carotid system: Left common carotid artery is mildly tortuous without focal stenosis. Dense atherosclerotic calcifications are present. Chronic opacification scratched at chronic occlusion of the left internal carotid artery is noted. Vertebral arteries: The right vertebral artery is dominant. Both vertebral arteries originate from the subclavian arteries without significant stenosis. No significant stenosis is present in either vertebral artery in the neck. Skeleton: The vertebral body heights and alignment are normal. Multilevel degenerative changes are stable. No focal osseous lesions are present. The patient is edentulous. Other neck: The soft tissues of the neck are otherwise unremarkable. Salivary glands are within normal limits. Thyroid is normal. No significant adenopathy is present. No focal mucosal or submucosal lesions are present. Upper chest: The lung apices are clear. Review of the MIP images confirms the above findings CTA HEAD FINDINGS Anterior circulation: Atherosclerotic calcifications are present within the right cavernous internal carotid artery without significant stenosis through the ICA terminus. The left internal carotid artery is reconstituted at the level of the ophthalmic artery. Moderate  narrowing of the supraclinoid left ICA is stable. The left posterior communicating artery is also patent. Supraclinoid left ICA is within normal limits. The right A1 segment is dominant. The M1 segments are within normal limits bilaterally. The MCA branch vessels are normal. Mild narrowing of distal branch vessels is present without a significant proximal stenosis  or occlusion within the ACA or MCA branches. Posterior circulation: PICA origins are visualized and normal. The vertebrobasilar junction and basilar artery are normal. The superior cerebellar arteries are patent. The left posterior cerebral artery originates from basilar tip. The right posterior cerebral artery is of fetal type. The PCA branch vessels are normal bilaterally. Venous sinuses: The dural sinuses are patent. The straight sinus and deep cerebral veins are intact. Cortical veins are within normal limits. No significant vascular malformation is evident. Anatomic variants: None Review of the MIP images confirms the above findings IMPRESSION: 1. 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 2. Stable moderate narrowing of the supraclinoid left ICA. 3. Stable atherosclerotic changes at the right carotid bifurcation and right cavernous internal carotid artery without significant stenosis. 4. Mild narrowing of distal branch vessels without a significant proximal stenosis or occlusion within the ACA or MCA branches. 5.  Aortic Atherosclerosis (ICD10-I70.0). Electronically Signed   By: Marin Roberts M.D.   On: 12/01/2023 15:31   MR BRAIN WO CONTRAST Result Date: 12/01/2023 CLINICAL DATA:  Multiple falls over the last several days. Abrasions to back of head. Headache. Patient fell at 1 a.m. and was unable to get back up. EXAM: MRI HEAD WITHOUT CONTRAST TECHNIQUE: Multiplanar, multiecho pulse sequences of the brain and surrounding structures were obtained without intravenous  contrast. COMPARISON:  CT head without contrast 12/01/2023. MR head without contrast 03/15/2023. FINDINGS: Brain: Punctate foci of restricted diffusion are noted along the margin of the previous right ACA territory infarct, involving the right cingulate gyrus. No acute hemorrhage or mass lesion is present. Is chronic encephalomalacia anteriorly in the right frontal lobe is stable. Confluent periventricular T2 hyperintensities are stable. The ventricles are proportionate to the degree of atrophy. Ventricular size is stable. No significant extraaxial fluid collection is present. A remote left cerebellar infarct is stable. Remote lacunar infarct is noted in the right cerebellum. The brainstem is within normal limits. The internal auditory canals are within normal limits. Midline structures are within normal limits. 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 The right internal carotid artery is patent. Posterior circulation is patent. Skull and upper cervical spine: The craniocervical junction is normal. Upper cervical spine is within normal limits. Marrow signal is unremarkable. Sinuses/Orbits: The paranasal sinuses and mastoid air cells are clear. Bilateral lens replacements are noted. Globes and orbits are otherwise unremarkable. IMPRESSION: 1. Punctate foci of restricted diffusion along the margin of the previous right ACA territory infarct, involving the right cingulate gyrus, consistent with a small acute infarct. 2. Stable chronic encephalomalacia anteriorly in the right frontal lobe. 3. Stable chronic left cerebellar infarct. 4. Stable remote lacunar infarct in the right cerebellum. 5. 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 6. Stable atrophy and white matter disease. This likely reflects the sequela of chronic microvascular ischemia. Electronically Signed   By: Marin Roberts M.D.   On: 12/01/2023 12:44   DG Thoracic Spine 2 View Result Date:  12/01/2023 CLINICAL DATA:  Fall.  Back injury. EXAM: THORACIC SPINE 2 VIEWS; LUMBAR SPINE - 2-3 VIEW COMPARISON:  CT lumbar spine 05/11/2021. Radiographs of the thoracolumbar spine 03/14/2021. FINDINGS: There are 12 rib-bearing thoracic type vertebral bodies. There is a stable convex right lower thoracic scoliosis with normal lateral alignment. No evidence of acute fracture, paraspinal hematoma or widening of the interpedicular distance. Multilevel spondylosis throughout the cervicothoracic spine, similar to prior radiographs. There are 5  lumbar type vertebral bodies. Stable mild chronic compression deformity at L1. No evidence of acute fracture or pars defect. The lumbar disc spaces are relatively preserved. There is mild multilevel facet arthropathy. Aortoiliac atherosclerosis noted. IMPRESSION: 1. No evidence of acute thoracic or lumbar spine fracture or traumatic malalignment. 2. Stable mild chronic compression deformity at L1. 3. Stable scoliosis and multilevel spondylosis. Electronically Signed   By: Carey Bullocks M.D.   On: 12/01/2023 10:13   DG Lumbar Spine 2-3 Views Result Date: 12/01/2023 CLINICAL DATA:  Fall.  Back injury. EXAM: THORACIC SPINE 2 VIEWS; LUMBAR SPINE - 2-3 VIEW COMPARISON:  CT lumbar spine 05/11/2021. Radiographs of the thoracolumbar spine 03/14/2021. FINDINGS: There are 12 rib-bearing thoracic type vertebral bodies. There is a stable convex right lower thoracic scoliosis with normal lateral alignment. No evidence of acute fracture, paraspinal hematoma or widening of the interpedicular distance. Multilevel spondylosis throughout the cervicothoracic spine, similar to prior radiographs. There are 5 lumbar type vertebral bodies. Stable mild chronic compression deformity at L1. No evidence of acute fracture or pars defect. The lumbar disc spaces are relatively preserved. There is mild multilevel facet arthropathy. Aortoiliac atherosclerosis noted. IMPRESSION: 1. No evidence of acute thoracic  or lumbar spine fracture or traumatic malalignment. 2. Stable mild chronic compression deformity at L1. 3. Stable scoliosis and multilevel spondylosis. Electronically Signed   By: Carey Bullocks M.D.   On: 12/01/2023 10:13   CT Head Wo Contrast Result Date: 12/01/2023 CLINICAL DATA:  Rail head trauma.  Neck trauma. EXAM: CT HEAD WITHOUT CONTRAST CT CERVICAL SPINE WITHOUT CONTRAST TECHNIQUE: Multidetector CT imaging of the head and cervical spine was performed following the standard protocol without intravenous contrast. Multiplanar CT image reconstructions of the cervical spine were also generated. RADIATION DOSE REDUCTION: This exam was performed according to the departmental dose-optimization program which includes automated exposure control, adjustment of the mA and/or kV according to patient size and/or use of iterative reconstruction technique. COMPARISON:  08/18/2022 FINDINGS: CT HEAD FINDINGS Brain: No evidence of acute infarction, hemorrhage, hydrocephalus, extra-axial collection or mass lesion/mass effect. 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 Ischemic gliosis in the cerebral white matter. Vascular: No hyperdense vessel or unexpected calcification. Skull: Normal. Negative for fracture or focal lesion. Sinuses/Orbits: No evidence of injury CT CERVICAL SPINE FINDINGS Alignment: Normal. Skull base and vertebrae: No acute fracture. No primary bone lesion or focal pathologic process. Soft tissues and spinal canal: No prevertebral fluid or swelling. No visible canal hematoma. Disc levels:  Generalized degenerative endplate and facet spurring. Upper chest: Negative IMPRESSION: No evidence of acute intracranial or cervical spine injury. Electronically Signed   By: Tiburcio Pea M.D.   On: 12/01/2023 09:09   CT Cervical Spine Wo Contrast Result Date: 12/01/2023 CLINICAL  DATA:  Nazari head trauma.  Neck trauma. EXAM: CT HEAD WITHOUT CONTRAST CT CERVICAL SPINE WITHOUT CONTRAST TECHNIQUE: Multidetector CT imaging of the head and cervical spine was performed following the standard protocol without intravenous contrast. Multiplanar CT image reconstructions of the cervical spine were also generated. RADIATION DOSE REDUCTION: This exam was performed according to the departmental dose-optimization program which includes automated exposure control, adjustment of the mA and/or kV according to patient size and/or use of iterative reconstruction technique. COMPARISON:  08/18/2022 FINDINGS: CT HEAD FINDINGS Brain: No evidence of acute infarction, hemorrhage, hydrocephalus, extra-axial collection or mass lesion/mass effect. 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 Ischemic gliosis in the cerebral white matter. Vascular: No hyperdense vessel or unexpected calcification. Skull: Normal. Negative for fracture or focal lesion. Sinuses/Orbits: No evidence of injury CT CERVICAL SPINE FINDINGS Alignment: Normal. Skull base and vertebrae: No acute fracture. No primary bone lesion or focal pathologic process. Soft tissues and spinal canal: No prevertebral fluid or swelling. No visible canal hematoma. Disc levels:  Generalized degenerative endplate and facet spurring. Upper chest: Negative IMPRESSION: No evidence of acute intracranial or cervical spine injury. Electronically Signed   By: Tiburcio Pea M.D.   On: 12/01/2023 09:09    Microbiology: Results for orders placed or performed in visit on 08/14/23  Urine Culture     Status: None   Collection Time: 08/14/23  1:14 PM  Result Value Ref Range Status   MICRO NUMBER: [id number]  Final   SPECIMEN QUALITY: Adequate  Final   Sample Source URINE  Final   STATUS: FINAL  Final   Result:   Final    Mixed genital  flora isolated. These superficial bacteria are not indicative of a urinary tract infection. No further organism identification is warranted on this specimen. If clinically indicated, recollect clean-catch, mid-stream urine and transfer  immediately to Urine Culture Transport Tube.     Labs: CBC: Recent Labs  Lab 12/01/23 0801  WBC 6.8  NEUTROABS 4.8  HGB 12.1  HCT 37.4  MCV 88.4  PLT 196   Basic Metabolic Panel: Recent Labs  Lab 12/01/23 0801 12/03/23 0412  NA 141 136  K 3.4* 4.0  CL 105 102  CO2 27 25  GLUCOSE 131* 105*  BUN 11 9  CREATININE 0.80 0.77  CALCIUM 9.1 8.9   Liver Function Tests: No results for input(s): "AST", "ALT", "ALKPHOS", "BILITOT", "PROT", "ALBUMIN" in the last 168 hours. CBG: No results for input(s): "GLUCAP" in the last 168 hours.  Discharge time spent: 36 minutes.  Signed: Marcelino Duster, MD [facility] 12/07/2023

## 2023-12-07 NOTE — TOC Transition Note (Signed)
 Transition of Care [facility]) - Discharge Note   Patient Details  Name: Carmen [name] MRN: [id number] Date of Birth: 02-03-1941  [facility] CM/SW Contact:  Erin Sons, LCSW Phone Number: 12/07/2023, 1:35 PM   Clinical Narrative:     [name] approved 3/7- 3/11 Auth# [id number]  Per MD patient ready for DC to [facility]. RN, patient, patient's family, and facility notified of DC. Discharge Summary and FL2 sent to facility. RN to call report prior to discharge [phone]). DC packet on chart. Daughter will transport.   CSW will sign off for now as social work intervention is no longer needed. Please consult [place] again if new needs arise.   Final next level of care: Skilled Nursing Facility Barriers to Discharge: No Barriers Identified         Discharge Placement              Patient chooses bed at: [facility] [address] Patient to be transferred to facility by: daughter Name of family member notified: Daughter Carmen [name] Patient and family notified of of transfer: 12/07/23  Discharge Plan and Services Additional resources added to the After Visit Summary for                                       Social Drivers of Health (SDOH) Interventions SDOH Screenings   Food Insecurity: No Food Insecurity (12/01/2023)  Housing: High Risk (12/02/2023)  Transportation Needs: No Transportation Needs (12/01/2023)  Utilities: Not At Risk (12/01/2023)  Alcohol Screen: Low Risk  (11/18/2020)  Depression (PHQ2-9): Low Risk  (10/17/2023)  Recent Concern: Depression (PHQ2-9) - Medium Risk (08/20/2023)  Financial Resource Strain: Low Risk  (08/20/2023)  Physical Activity: Insufficiently Active (08/20/2023)  Social Connections: Moderately Isolated (12/01/2023)  Stress: No Stress Concern Present (08/20/2023)  Tobacco Use: Medium Risk (12/01/2023)  Health Literacy: Patient Unable To Answer (08/20/2023)     Readmission Risk Interventions     No data to display

## 2023-12-07 NOTE — Progress Notes (Addendum)
 Patient report given to RN Arlys John. No any questions at this time. All questions are answered via phone.

## 2023-12-07 NOTE — Care Management Important Message (Signed)
 Important Message  Patient Details  Name: Carmen [name] MRN: [id number] Date of Birth: [birth year]   Important Message Given:  Yes - Medicare IM     Cristela Blue, CMA 12/07/2023, 9:49 AM

## 2023-12-07 NOTE — Plan of Care (Signed)

## 2023-12-10 DIAGNOSIS — I739 Peripheral vascular disease, unspecified: Secondary | ICD-10-CM | POA: Diagnosis not present

## 2023-12-10 DIAGNOSIS — I10 Essential (primary) hypertension: Secondary | ICD-10-CM | POA: Diagnosis not present

## 2023-12-10 DIAGNOSIS — I4891 Unspecified atrial fibrillation: Secondary | ICD-10-CM | POA: Diagnosis not present

## 2023-12-10 DIAGNOSIS — M069 Rheumatoid arthritis, unspecified: Secondary | ICD-10-CM | POA: Diagnosis not present

## 2023-12-11 DIAGNOSIS — M25511 Pain in right shoulder: Secondary | ICD-10-CM | POA: Diagnosis not present

## 2023-12-11 DIAGNOSIS — Z8673 Personal history of transient ischemic attack (TIA), and cerebral infarction without residual deficits: Secondary | ICD-10-CM | POA: Diagnosis not present

## 2023-12-11 DIAGNOSIS — M069 Rheumatoid arthritis, unspecified: Secondary | ICD-10-CM | POA: Diagnosis not present

## 2023-12-11 DIAGNOSIS — I639 Cerebral infarction, unspecified: Secondary | ICD-10-CM | POA: Diagnosis not present

## 2023-12-11 DIAGNOSIS — M6281 Muscle weakness (generalized): Secondary | ICD-10-CM | POA: Diagnosis not present

## 2023-12-11 DIAGNOSIS — W19XXXA Unspecified fall, initial encounter: Secondary | ICD-10-CM | POA: Diagnosis not present

## 2023-12-11 DIAGNOSIS — I4891 Unspecified atrial fibrillation: Secondary | ICD-10-CM | POA: Diagnosis not present

## 2023-12-11 DIAGNOSIS — I10 Essential (primary) hypertension: Secondary | ICD-10-CM | POA: Diagnosis not present

## 2023-12-11 DIAGNOSIS — R278 Other lack of coordination: Secondary | ICD-10-CM | POA: Diagnosis not present

## 2023-12-11 DIAGNOSIS — M4989 Spondylopathy in diseases classified elsewhere, multiple sites in spine: Secondary | ICD-10-CM | POA: Diagnosis not present

## 2023-12-11 DIAGNOSIS — M79621 Pain in right upper arm: Secondary | ICD-10-CM | POA: Diagnosis not present

## 2023-12-12 ENCOUNTER — Other Ambulatory Visit: Payer: Self-pay | Admitting: Primary Care

## 2023-12-12 DIAGNOSIS — I10 Essential (primary) hypertension: Secondary | ICD-10-CM | POA: Diagnosis not present

## 2023-12-12 DIAGNOSIS — I4891 Unspecified atrial fibrillation: Secondary | ICD-10-CM | POA: Diagnosis not present

## 2023-12-12 DIAGNOSIS — I639 Cerebral infarction, unspecified: Secondary | ICD-10-CM | POA: Diagnosis not present

## 2023-12-12 DIAGNOSIS — R12 Heartburn: Secondary | ICD-10-CM

## 2023-12-12 DIAGNOSIS — M069 Rheumatoid arthritis, unspecified: Secondary | ICD-10-CM | POA: Diagnosis not present

## 2023-12-13 ENCOUNTER — Ambulatory Visit: Payer: Medicare Other | Admitting: Family

## 2023-12-14 DIAGNOSIS — I10 Essential (primary) hypertension: Secondary | ICD-10-CM | POA: Diagnosis not present

## 2023-12-14 DIAGNOSIS — I4891 Unspecified atrial fibrillation: Secondary | ICD-10-CM | POA: Diagnosis not present

## 2023-12-14 DIAGNOSIS — Z8673 Personal history of transient ischemic attack (TIA), and cerebral infarction without residual deficits: Secondary | ICD-10-CM | POA: Diagnosis not present

## 2023-12-14 DIAGNOSIS — M6281 Muscle weakness (generalized): Secondary | ICD-10-CM | POA: Diagnosis not present

## 2023-12-14 DIAGNOSIS — M069 Rheumatoid arthritis, unspecified: Secondary | ICD-10-CM | POA: Diagnosis not present

## 2023-12-14 DIAGNOSIS — R278 Other lack of coordination: Secondary | ICD-10-CM | POA: Diagnosis not present

## 2023-12-14 DIAGNOSIS — M4989 Spondylopathy in diseases classified elsewhere, multiple sites in spine: Secondary | ICD-10-CM | POA: Diagnosis not present

## 2023-12-14 DIAGNOSIS — I639 Cerebral infarction, unspecified: Secondary | ICD-10-CM | POA: Diagnosis not present

## 2023-12-17 DIAGNOSIS — I639 Cerebral infarction, unspecified: Secondary | ICD-10-CM | POA: Diagnosis not present

## 2023-12-17 DIAGNOSIS — I10 Essential (primary) hypertension: Secondary | ICD-10-CM | POA: Diagnosis not present

## 2023-12-17 DIAGNOSIS — I4891 Unspecified atrial fibrillation: Secondary | ICD-10-CM | POA: Diagnosis not present

## 2023-12-17 DIAGNOSIS — R296 Repeated falls: Secondary | ICD-10-CM | POA: Diagnosis not present

## 2023-12-18 ENCOUNTER — Emergency Department (HOSPITAL_COMMUNITY)

## 2023-12-18 ENCOUNTER — Emergency Department (HOSPITAL_COMMUNITY)
Admission: EM | Admit: 2023-12-18 | Discharge: 2023-12-18 | Disposition: A | Discharge status: Home / Self Care | Attending: Emergency Medicine | Admitting: Emergency Medicine

## 2023-12-18 DIAGNOSIS — S0003XA Contusion of scalp, initial encounter: Secondary | ICD-10-CM | POA: Diagnosis not present

## 2023-12-18 DIAGNOSIS — S5012XA Contusion of left forearm, initial encounter: Secondary | ICD-10-CM | POA: Diagnosis not present

## 2023-12-18 DIAGNOSIS — Z043 Encounter for examination and observation following other accident: Secondary | ICD-10-CM | POA: Diagnosis not present

## 2023-12-18 DIAGNOSIS — W0110XA Fall on same level from slipping, tripping and stumbling with subsequent striking against unspecified object, initial encounter: Secondary | ICD-10-CM | POA: Insufficient documentation

## 2023-12-18 DIAGNOSIS — Z79899 Other long term (current) drug therapy: Secondary | ICD-10-CM | POA: Insufficient documentation

## 2023-12-18 DIAGNOSIS — Z7982 Long term (current) use of aspirin: Secondary | ICD-10-CM | POA: Diagnosis not present

## 2023-12-18 DIAGNOSIS — S8001XA Contusion of right knee, initial encounter: Secondary | ICD-10-CM | POA: Diagnosis not present

## 2023-12-18 DIAGNOSIS — Z7902 Long term (current) use of antithrombotics/antiplatelets: Secondary | ICD-10-CM | POA: Insufficient documentation

## 2023-12-18 DIAGNOSIS — W19XXXA Unspecified fall, initial encounter: Secondary | ICD-10-CM

## 2023-12-18 DIAGNOSIS — I6782 Cerebral ischemia: Secondary | ICD-10-CM | POA: Diagnosis not present

## 2023-12-18 DIAGNOSIS — Z8673 Personal history of transient ischemic attack (TIA), and cerebral infarction without residual deficits: Secondary | ICD-10-CM | POA: Insufficient documentation

## 2023-12-18 DIAGNOSIS — I517 Cardiomegaly: Secondary | ICD-10-CM | POA: Diagnosis not present

## 2023-12-18 DIAGNOSIS — S0990XA Unspecified injury of head, initial encounter: Secondary | ICD-10-CM | POA: Diagnosis not present

## 2023-12-18 DIAGNOSIS — M1711 Unilateral primary osteoarthritis, right knee: Secondary | ICD-10-CM | POA: Diagnosis not present

## 2023-12-18 DIAGNOSIS — S0083XA Contusion of other part of head, initial encounter: Secondary | ICD-10-CM | POA: Diagnosis not present

## 2023-12-18 DIAGNOSIS — Y92002 Bathroom of unspecified non-institutional (private) residence single-family (private) house as the place of occurrence of the external cause: Secondary | ICD-10-CM | POA: Insufficient documentation

## 2023-12-18 LAB — I-STAT CHEM 8, ED
BUN: 18 mg/dL (ref 8–23)
Calcium, Ion: 1.15 mmol/L (ref 1.15–1.40)
Chloride: 102 mmol/L (ref 98–111)
Creatinine, Ser: 0.9 mg/dL (ref 0.44–1.00)
Glucose, Bld: 106 mg/dL — ABNORMAL HIGH (ref 70–99)
HCT: 33 % — ABNORMAL LOW (ref 36.0–46.0)
Hemoglobin: 11.2 g/dL — ABNORMAL LOW (ref 12.0–15.0)
Potassium: 4.3 mmol/L (ref 3.5–5.1)
Sodium: 136 mmol/L (ref 135–145)
TCO2: 24 mmol/L (ref 22–32)

## 2023-12-18 LAB — URINALYSIS, ROUTINE W REFLEX MICROSCOPIC
Bilirubin Urine: NEGATIVE
Glucose, UA: NEGATIVE mg/dL
Hgb urine dipstick: NEGATIVE
Ketones, ur: NEGATIVE mg/dL
Leukocytes,Ua: NEGATIVE
Nitrite: NEGATIVE
Protein, ur: NEGATIVE mg/dL
Specific Gravity, Urine: 1.011 (ref 1.005–1.030)
pH: 7 (ref 5.0–8.0)

## 2023-12-18 LAB — CBC
HCT: 32.6 % — ABNORMAL LOW (ref 36.0–46.0)
Hemoglobin: 10.5 g/dL — ABNORMAL LOW (ref 12.0–15.0)
MCH: 28.2 pg (ref 26.0–34.0)
MCHC: 32.2 g/dL (ref 30.0–36.0)
MCV: 87.6 fL (ref 80.0–100.0)
Platelets: 258 10*3/uL (ref 150–400)
RBC: 3.72 MIL/uL — ABNORMAL LOW (ref 3.87–5.11)
RDW: 14.6 % (ref 11.5–15.5)
WBC: 6.6 10*3/uL (ref 4.0–10.5)
nRBC: 0 % (ref 0.0–0.2)

## 2023-12-18 LAB — PROTIME-INR
INR: 1.2 (ref 0.8–1.2)
Prothrombin Time: 15.2 s (ref 11.4–15.2)

## 2023-12-18 LAB — I-STAT CG4 LACTIC ACID, ED: Lactic Acid, Venous: 1.2 mmol/L (ref 0.5–1.9)

## 2023-12-18 LAB — ETHANOL: Alcohol, Ethyl (B): <10 mg/dL (ref <10)

## 2023-12-18 MED ORDER — LACTATED RINGERS IV BOLUS
1000.0000 mL | Freq: Once | INTRAVENOUS | Status: AC
  Administered 2023-12-18: 1000 mL via INTRAVENOUS

## 2023-12-18 NOTE — ED Triage Notes (Addendum)
 Pt BIB [facility] for a fall on Plavix in the bathroom coming from [facility]. Pt has hematoma to forehead and a bruise to left arm from a fall yesterday.  Pt denies LOC in fall today, pt is alert to baseline which seems to be x4 at this moment.   158/84 98% 60 18 CBG 119  18G R. AC

## 2023-12-18 NOTE — Discharge Instructions (Signed)
 As we discussed, your CTs and x-rays were unremarkable today  Please use your walker next time when you walk  See your doctor for follow-up  Return to ER if you have another fall, headache or vomiting

## 2023-12-18 NOTE — ED Provider Notes (Signed)
 [place] [facility]GENCY DEPARTMENT AT [facility] Provider Note   CSN: [id number] Arrival date & time: 12/18/23  8756     History  Chief Complaint  Patient presents with   Fall on Plavix    Zeenat Mcgath is a 83 y.o. 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  Patient states that she just finished physical therapy and try to get back to her room.  She states that she tried to use the bathroom but slipped and fell and hit her head.  Patient also states that several days ago, she fell and hit her left forearm.  Since she had head injury and is on Plavix, level 2 trauma was activated.  The history is provided by the patient.       Home Medications Prior to Admission medications   Medication Sig Start Date End Date Taking? Authorizing Provider  aspirin EC 81 MG tablet Take 1 tablet (81 mg total) by mouth daily for 15 days. Swallow whole. 12/08/23 12/23/23  Marcelino Duster, MD  atorvastatin (LIPITOR) 40 MG tablet Take 1 tablet (40 mg total) by mouth at bedtime. 02/01/23   Mort Sawyers, FNP  CALCIUM PO Take 1 tablet by mouth daily.    [provider]  Cholecalciferol (VITAMIN D-3) 25 MCG (1000 UT) CAPS Take 1,000 Units by mouth daily.    [provider]  clopidogrel (PLAVIX) 75 MG tablet Take 1 tablet (75 mg total) by mouth daily. 06/28/23 06/27/24  Georgie Chard D, NP  cyanocobalamin (VITAMIN B12) 500 MCG tablet Take 500 mcg by mouth daily.    [provider]  diphenhydrAMINE (BENADRYL) 25 MG tablet Take 25 mg by mouth at bedtime as needed for allergies.    [provider]  divalproex (DEPAKOTE) 500 MG DR tablet Take 1 tablet (500 mg total) by mouth at bedtime. 02/27/23   Mort Sawyers, FNP  famotidine (PEPCID AC) 10 MG tablet Take 10 mg by mouth daily as needed for heartburn or indigestion.    [provider]  furosemide (LASIX) 20 MG tablet Take 1 tablet (20 mg total) by mouth daily as needed (for  swelling). 09/27/23   Denyce Robert, NP  HYDROcodone-acetaminophen (NORCO/VICODIN) 5-325 MG tablet Take 1 tablet by mouth 2 (two) times daily as needed for up to 7 days for severe pain (pain score 7-10). Must last 30 days. 12/07/23 12/14/23  Marcelino Duster, MD  hydroxychloroquine (PLAQUENIL) 200 MG tablet Take 200 mg by mouth 2 (two) times daily.    [provider]  levothyroxine (SYNTHROID) 137 MCG tablet Take 1 tablet (137 mcg total) by mouth daily before breakfast. 02/27/23   Mort Sawyers, FNP  loratadine (CLARITIN) 10 MG tablet Take 1 tablet (10 mg total) by mouth daily. 02/21/23   Mort Sawyers, FNP  losartan (COZAAR) 25 MG tablet Take 1 tablet (25 mg total) by mouth daily. 12/07/23 03/06/24  Marcelino Duster, MD  magnesium oxide (MAG-OX) 400 (240 Mg) MG tablet Take 400 mg by mouth daily.    [provider]  metoprolol succinate (TOPROL-XL) 100 MG 24 hr tablet Take 0.5 tablets (50 mg total) by mouth daily. Take with or immediately following a meal. 09/27/23   Denyce Robert, NP  naloxone [facility]) nasal spray 4 mg/0.1 mL Place 1 spray into the nose as needed for up to 365 doses (for opioid-induced respiratory depresssion). In case of emergency (overdose), spray once into each nostril. If no response within 3 minutes, repeat application and call 911.  10/18/23 10/15/24  Delano Metz, MD  pantoprazole (PROTONIX) 40 MG tablet TAKE 1 TABLET BY MOUTH TWICE A DAY 12/12/23   Mort Sawyers, FNP  polyethylene glycol (MIRALAX / GLYCOLAX) 17 g packet Take 17 g by mouth daily as needed. 12/07/23   Marcelino Duster, MD  traZODone (DESYREL) 100 MG tablet TAKE 1 TABLET BY MOUTH AT BEDTIME AS NEEDED FOR SLEEP 04/12/23   Mort Sawyers, FNP      Allergies    Patient has no known allergies.    Review of Systems   Review of Systems  Neurological:  Positive for headaches.  All other systems reviewed and are negative.   Physical Exam Updated Vital Signs BP (!) 147/77    Pulse 82   Temp 98 F (36.7 C)   Resp 16   Ht 5' (1.524 m)   Wt 77.6 kg   SpO2 99%   BMI 33.41 kg/m  Physical Exam Vitals and nursing note reviewed.  Constitutional:      Comments: Mild L forehead hematoma   HENT:     Head: Normocephalic.     Comments: Mild L forehead hematoma     Nose: Nose normal.     Mouth/Throat:     Mouth: Mucous membranes are moist.  Eyes:     Extraocular Movements: Extraocular movements intact.     Pupils: Pupils are equal, round, and reactive to light.  Cardiovascular:     Rate and Rhythm: Normal rate and regular rhythm.  Pulmonary:     Effort: Pulmonary effort is normal.     Breath sounds: Normal breath sounds.  Abdominal:     General: Abdomen is flat.     Palpations: Abdomen is soft.  Musculoskeletal:     Cervical back: Normal range of motion and neck supple.     Comments: Bruising L forearm and R knee, nl ROM bilateral hips   Skin:    General: Skin is warm.  Neurological:     General: No focal deficit present.     Mental Status: She is alert and oriented to person, place, and time.  Psychiatric:        Mood and Affect: Mood normal.        Behavior: Behavior normal.     ED Results / Procedures / Treatments   Labs (all labs ordered are listed, but only abnormal results are displayed) Labs Reviewed  CBC - Abnormal; Notable for the following components:      Result Value   RBC 3.72 (*)    Hemoglobin 10.5 (*)    HCT 32.6 (*)    All other components within normal limits  I-STAT CHEM 8, ED - Abnormal; Notable for the following components:   Glucose, Bld 106 (*)    Hemoglobin 11.2 (*)    HCT 33.0 (*)    All other components within normal limits  ETHANOL  URINALYSIS, ROUTINE W REFLEX MICROSCOPIC  PROTIME-INR  COMPREHENSIVE METABOLIC PANEL  I-STAT CG4 LACTIC ACID, ED    EKG None  Radiology CT HEAD WO CONTRAST Result Date: 12/18/2023 CLINICAL DATA:  Fall with trauma to the head and face EXAM: CT HEAD WITHOUT CONTRAST CT  MAXILLOFACIAL WITHOUT CONTRAST CT CERVICAL SPINE WITHOUT CONTRAST TECHNIQUE: Multidetector CT imaging of the head, cervical spine, and maxillofacial structures were performed using the standard protocol without intravenous contrast. Multiplanar CT image reconstructions of the cervical spine and maxillofacial structures were also generated. RADIATION DOSE REDUCTION: This exam was performed according to the departmental dose-optimization program which includes  automated exposure control, adjustment of the mA and/or kV according to patient size and/or use of iterative reconstruction technique. COMPARISON:  12/01/2023 FINDINGS: CT HEAD FINDINGS Brain: Old infarction in the left cerebellum. Cerebral hemispheres show chronic small-vessel ischemic changes throughout the white matter and an old right frontal cortical and subcortical infarction. No acute stroke, mass, hemorrhage, hydrocephalus or extra-axial collection. Vascular: There is atherosclerotic calcification of the major vessels at the base of the brain. Skull: No skull fracture. Other: Left forehead region soft tissue hematoma. CT MAXILLOFACIAL FINDINGS Osseous: No facial fracture. Orbits: No soft tissue orbital injury. Sinuses: Sinuses are clear. Soft tissues: Left supraorbital and forehead soft tissue hematoma. CT CERVICAL SPINE FINDINGS Alignment: No traumatic malalignment. Skull base and vertebrae: No fracture or focal lesion. Soft tissues and spinal canal: No traumatic soft tissue finding. Disc levels: Ordinary degenerative spondylosis from C3-4 through C6-7. No apparent compressive bony stenosis of the canal. Bony foraminal narrowing at C5-6 and C6-7. Upper chest: Negative Other: None IMPRESSION: HEAD CT: No acute intracranial finding. Old infarctions in the left cerebellum and right frontal lobe. Chronic small-vessel ischemic changes of the cerebral hemispheric white matter. MAXILLOFACIAL CT: No facial fracture. Left supraorbital and forehead soft tissue  hematoma. CERVICAL SPINE CT: No acute or traumatic finding. Ordinary degenerative spondylosis from C3-4 through C6-7. Bony foraminal narrowing at C5-6 and C6-7. Electronically Signed   By: Paulina Fusi M.D.   On: 12/18/2023 18:25   CT CERVICAL SPINE WO CONTRAST Result Date: 12/18/2023 CLINICAL DATA:  Fall with trauma to the head and face EXAM: CT HEAD WITHOUT CONTRAST CT MAXILLOFACIAL WITHOUT CONTRAST CT CERVICAL SPINE WITHOUT CONTRAST TECHNIQUE: Multidetector CT imaging of the head, cervical spine, and maxillofacial structures were performed using the standard protocol without intravenous contrast. Multiplanar CT image reconstructions of the cervical spine and maxillofacial structures were also generated. RADIATION DOSE REDUCTION: This exam was performed according to the departmental dose-optimization program which includes automated exposure control, adjustment of the mA and/or kV according to patient size and/or use of iterative reconstruction technique. COMPARISON:  12/01/2023 FINDINGS: CT HEAD FINDINGS Brain: Old infarction in the left cerebellum. Cerebral hemispheres show chronic small-vessel ischemic changes throughout the white matter and an old right frontal cortical and subcortical infarction. No acute stroke, mass, hemorrhage, hydrocephalus or extra-axial collection. Vascular: There is atherosclerotic calcification of the major vessels at the base of the brain. Skull: No skull fracture. Other: Left forehead region soft tissue hematoma. CT MAXILLOFACIAL FINDINGS Osseous: No facial fracture. Orbits: No soft tissue orbital injury. Sinuses: Sinuses are clear. Soft tissues: Left supraorbital and forehead soft tissue hematoma. CT CERVICAL SPINE FINDINGS Alignment: No traumatic malalignment. Skull base and vertebrae: No fracture or focal lesion. Soft tissues and spinal canal: No traumatic soft tissue finding. Disc levels: Ordinary degenerative spondylosis from C3-4 through C6-7. No apparent compressive bony  stenosis of the canal. Bony foraminal narrowing at C5-6 and C6-7. Upper chest: Negative Other: None IMPRESSION: HEAD CT: No acute intracranial finding. Old infarctions in the left cerebellum and right frontal lobe. Chronic small-vessel ischemic changes of the cerebral hemispheric white matter. MAXILLOFACIAL CT: No facial fracture. Left supraorbital and forehead soft tissue hematoma. CERVICAL SPINE CT: No acute or traumatic finding. Ordinary degenerative spondylosis from C3-4 through C6-7. Bony foraminal narrowing at C5-6 and C6-7. Electronically Signed   By: Paulina Fusi M.D.   On: 12/18/2023 18:25   CT MAXILLOFACIAL WO CONTRAST Result Date: 12/18/2023 CLINICAL DATA:  Fall with trauma to the head and face EXAM: CT HEAD  WITHOUT CONTRAST CT MAXILLOFACIAL WITHOUT CONTRAST CT CERVICAL SPINE WITHOUT CONTRAST TECHNIQUE: Multidetector CT imaging of the head, cervical spine, and maxillofacial structures were performed using the standard protocol without intravenous contrast. Multiplanar CT image reconstructions of the cervical spine and maxillofacial structures were also generated. RADIATION DOSE REDUCTION: This exam was performed according to the departmental dose-optimization program which includes automated exposure control, adjustment of the mA and/or kV according to patient size and/or use of iterative reconstruction technique. COMPARISON:  12/01/2023 FINDINGS: CT HEAD FINDINGS Brain: Old infarction in the left cerebellum. Cerebral hemispheres show chronic small-vessel ischemic changes throughout the white matter and an old right frontal cortical and subcortical infarction. No acute stroke, mass, hemorrhage, hydrocephalus or extra-axial collection. Vascular: There is atherosclerotic calcification of the major vessels at the base of the brain. Skull: No skull fracture. Other: Left forehead region soft tissue hematoma. CT MAXILLOFACIAL FINDINGS Osseous: No facial fracture. Orbits: No soft tissue orbital injury.  Sinuses: Sinuses are clear. Soft tissues: Left supraorbital and forehead soft tissue hematoma. CT CERVICAL SPINE FINDINGS Alignment: No traumatic malalignment. Skull base and vertebrae: No fracture or focal lesion. Soft tissues and spinal canal: No traumatic soft tissue finding. Disc levels: Ordinary degenerative spondylosis from C3-4 through C6-7. No apparent compressive bony stenosis of the canal. Bony foraminal narrowing at C5-6 and C6-7. Upper chest: Negative Other: None IMPRESSION: HEAD CT: No acute intracranial finding. Old infarctions in the left cerebellum and right frontal lobe. Chronic small-vessel ischemic changes of the cerebral hemispheric white matter. MAXILLOFACIAL CT: No facial fracture. Left supraorbital and forehead soft tissue hematoma. CERVICAL SPINE CT: No acute or traumatic finding. Ordinary degenerative spondylosis from C3-4 through C6-7. Bony foraminal narrowing at C5-6 and C6-7. Electronically Signed   By: Paulina Fusi M.D.   On: 12/18/2023 18:25   DG Pelvis Portable Result Date: 12/18/2023 CLINICAL DATA:  Fall. EXAM: PORTABLE PELVIS 1-2 VIEWS COMPARISON:  None Available. FINDINGS: There is no evidence of pelvic fracture or diastasis. No pelvic bone lesions are seen. IMPRESSION: Negative. Electronically Signed   By: Lupita Raider M.D.   On: 12/18/2023 18:18   DG Knee Right Port Result Date: 12/18/2023 CLINICAL DATA:  Fall. EXAM: PORTABLE RIGHT KNEE - 1-2 VIEW COMPARISON:  August 18, 2022. FINDINGS: No evidence of fracture, dislocation, or joint effusion. 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 Soft tissues are unremarkable. 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 No acute abnormality seen. Electronically Signed   By: Lupita Raider M.D.   On: 12/18/2023 18:18   DG Forearm Left Result Date: 12/18/2023 CLINICAL DATA:  Fall. EXAM: LEFT FOREARM - 2 VIEW COMPARISON:  None Available. FINDINGS: There is no evidence of  fracture or other focal bone lesions. Soft tissues are unremarkable. IMPRESSION: Negative. Electronically Signed   By: Lupita Raider M.D.   On: 12/18/2023 18:16   DG Chest Port 1 View Result Date: 12/18/2023 CLINICAL DATA:  Fall. EXAM: PORTABLE CHEST 1 VIEW COMPARISON:  December 28, 2022. FINDINGS: Mild cardiomegaly is noted. Sternotomy wires are noted. Both lungs are clear. The visualized skeletal structures are unremarkable. IMPRESSION: No active disease. Electronically Signed   By: Lupita Raider M.D.   On: 12/18/2023 18:15    Procedures Procedures    Medications Ordered in ED Medications  lactated ringers bolus 1,000 mL (1,000 mLs Intravenous New Bag/Given 12/18/23 1657)    ED Course/ Medical Decision Making/ A&P  Medical Decision Making Farrin Splawn is a 83 y.o. female here with fall. Patient has obvious left scalp hematoma.  Also some extremity trauma.  Plan to get labs and CT head neck and face and extremity x-rays.  8:01 PM I reviewed patient's labs and they were unremarkable.  CT head and cervical spine and face unremarkable.  X-ray did not show any fractures.  Daughter is at bedside had some questions regarding home care.  Talked to [place] from case management.  She was able to address some of the concerns.  Patient will go back to [facility] for rehab and after rehab, daughter plans to take her home.  Problems Addressed: Contusion of right knee, initial encounter: acute illness or injury Fall, initial encounter: acute illness or injury Hematoma of scalp, initial encounter: acute illness or injury  Amount and/or Complexity of Data Reviewed Labs: ordered. Decision-making details documented in ED Course. Radiology: ordered and independent interpretation performed. Decision-making details documented in ED Course.    Final Clinical Impression(s) / ED Diagnoses Final diagnoses:  None    Rx / DC Orders ED Discharge Orders     None          Charlynne Pander, MD 12/18/23 2004

## 2023-12-18 NOTE — Progress Notes (Signed)
   12/18/23 1600  Spiritual Encounters  Type of Visit Initial  Care provided to: Pt and family  OnCall Visit Yes   Chaplain was paged to trauma level 2. Patient stated that she fell in th bathroom, no one came to help her for a while. She fell two times in two days. She has a big bruise on her left arm. She is not satisfied for the service that she has been taken. Chaplain listened attentively her concerns and provided compassionate presence. Her daughter is at bedside.    M.Kubra Delano Metz Resident [phone]

## 2023-12-18 NOTE — Progress Notes (Signed)
 Transition of Care [facility]) - Inpatient Brief Assessment   Patient Details  Name: Syrenity Klepacki MRN: [id number] Date of Birth: [birth year]  [facility] CM/SW Contact:    Oletta Cohn, RN Phone Number: 12/18/2023, 8:21 PM   Clinical Narrative: RNCM consulted regarding pt returning to SNF vs home with home health and DME.  RNCM contacted daughter, Antionette and reviewed the options. Antoinette voiced concerns about pt falls while in the the facility and her preparing for pt to live in "in-law" apartment at her home.  Antionette decided to have mom return to SNF and discharge home to apartment so that she can finish equipping apartment for pt safe discharge.  Information relayed to EDP for discharge back to [facility].   Transition of Care Asessment: Insurance and Status: Insurance coverage has been reviewed Patient has primary care physician: Yes Home environment has been reviewed: From [employer] Prior level of function:: (P) Limited assistance for ADLs and max assist for ambultation Prior/Current Home Services: (P)  (N/A) Social Drivers of Health Review: (P) SDOH reviewed no interventions necessary Readmission risk has been reviewed: (P) Yes Transition of care needs: (P) transition of care needs identified, TOC will continue to follow

## 2023-12-20 DIAGNOSIS — M069 Rheumatoid arthritis, unspecified: Secondary | ICD-10-CM | POA: Diagnosis not present

## 2023-12-20 DIAGNOSIS — I10 Essential (primary) hypertension: Secondary | ICD-10-CM | POA: Diagnosis not present

## 2023-12-20 DIAGNOSIS — R296 Repeated falls: Secondary | ICD-10-CM | POA: Diagnosis not present

## 2023-12-20 DIAGNOSIS — I639 Cerebral infarction, unspecified: Secondary | ICD-10-CM | POA: Diagnosis not present

## 2023-12-21 DIAGNOSIS — R278 Other lack of coordination: Secondary | ICD-10-CM | POA: Diagnosis not present

## 2023-12-21 DIAGNOSIS — M6281 Muscle weakness (generalized): Secondary | ICD-10-CM | POA: Diagnosis not present

## 2023-12-21 DIAGNOSIS — M4989 Spondylopathy in diseases classified elsewhere, multiple sites in spine: Secondary | ICD-10-CM | POA: Diagnosis not present

## 2023-12-21 DIAGNOSIS — Z8673 Personal history of transient ischemic attack (TIA), and cerebral infarction without residual deficits: Secondary | ICD-10-CM | POA: Diagnosis not present

## 2023-12-24 DIAGNOSIS — I639 Cerebral infarction, unspecified: Secondary | ICD-10-CM | POA: Diagnosis not present

## 2023-12-24 DIAGNOSIS — M069 Rheumatoid arthritis, unspecified: Secondary | ICD-10-CM | POA: Diagnosis not present

## 2023-12-24 DIAGNOSIS — R296 Repeated falls: Secondary | ICD-10-CM | POA: Diagnosis not present

## 2023-12-24 DIAGNOSIS — I10 Essential (primary) hypertension: Secondary | ICD-10-CM | POA: Diagnosis not present

## 2023-12-25 DIAGNOSIS — M6281 Muscle weakness (generalized): Secondary | ICD-10-CM | POA: Diagnosis not present

## 2023-12-25 DIAGNOSIS — Z8673 Personal history of transient ischemic attack (TIA), and cerebral infarction without residual deficits: Secondary | ICD-10-CM | POA: Diagnosis not present

## 2023-12-25 DIAGNOSIS — M4989 Spondylopathy in diseases classified elsewhere, multiple sites in spine: Secondary | ICD-10-CM | POA: Diagnosis not present

## 2023-12-25 DIAGNOSIS — R278 Other lack of coordination: Secondary | ICD-10-CM | POA: Diagnosis not present

## 2023-12-27 DIAGNOSIS — I4891 Unspecified atrial fibrillation: Secondary | ICD-10-CM | POA: Diagnosis not present

## 2023-12-27 DIAGNOSIS — M069 Rheumatoid arthritis, unspecified: Secondary | ICD-10-CM | POA: Diagnosis not present

## 2023-12-27 DIAGNOSIS — I10 Essential (primary) hypertension: Secondary | ICD-10-CM | POA: Diagnosis not present

## 2023-12-27 DIAGNOSIS — R296 Repeated falls: Secondary | ICD-10-CM | POA: Diagnosis not present

## 2023-12-28 ENCOUNTER — Telehealth: Payer: Self-pay

## 2023-12-28 NOTE — Transitions of Care (Post Inpatient/ED Visit) (Signed)
   12/28/2023  Name: Carmen [name] MRN: [id number] DOB: 1941/05/26  Today's TOC FU Call Status: Today's TOC FU Call Status:: Unsuccessful Call (1st Attempt) Unsuccessful Call (1st Attempt) Date: 12/28/23  Attempted to reach the patient regarding the most recent Inpatient/ED visit.  Follow Up Plan: Additional outreach attempts will be made to reach the patient to complete the Transitions of Care (Post Inpatient/ED visit) call.   Signature Karena Addison, LPN [facility] Nurse Health Advisor Direct Dial [phone]

## 2023-12-28 NOTE — Transitions of Care (Post Inpatient/ED Visit) (Signed)
 12/28/2023  Name: Carmen [name] MRN: [id number] DOB: 02/01/1941  Today's TOC FU Call Status: Today's TOC FU Call Status:: Successful TOC FU Call Completed Unsuccessful Call (1st Attempt) Date: 12/28/23 [facility] FU Call Complete Date: 12/28/23 Patient's Name and Date of Birth confirmed.  Transition Care Management Follow-up Telephone Call Date of Discharge: 12/27/23 Discharge Facility: Other ([facility]) Name of Other ([facility]) Discharge Facility: [facility] Type of Discharge: Inpatient Admission Primary Inpatient Discharge Diagnosis:: bypass surgery How have you been since you were released from the hospital?: Better Any questions or concerns?: No  Items Reviewed: Did you receive and understand the discharge instructions provided?: Yes Medications obtained,verified, and reconciled?: Yes (Medications Reviewed) Any new allergies since your discharge?: No Dietary orders reviewed?: Yes Do you have support at home?: Yes People in Home: child(ren), adult  Medications Reviewed Today: Medications Reviewed Today     Reviewed by Karena Addison, LPN (Licensed Practical Nurse) on 12/28/23 at 1006  Med List Status: <None>   Medication Order Taking? Sig Documenting Provider Last Dose Status Informant  atorvastatin (LIPITOR) 40 MG tablet [id number] No Take 1 tablet (40 mg total) by mouth at bedtime. Mort Sawyers, FNP 11/30/2023 Evening Active Child  CALCIUM PO [id number] No Take 1 tablet by mouth daily. [provider] Taking Active Child  Cholecalciferol (VITAMIN D-3) 25 MCG (1000 UT) CAPS [id number] No Take 1,000 Units by mouth daily. [provider] 12/01/2023 Active Child  clopidogrel (PLAVIX) 75 MG tablet [id number] No Take 1 tablet (75 mg total) by mouth daily. Georgie Chard D, NP 12/01/2023 Morning Active Child  cyanocobalamin (VITAMIN B12) 500 MCG tablet [id number] No Take 500 mcg by mouth daily. [provider] 12/01/2023 Active Child  diphenhydrAMINE  (BENADRYL) 25 MG tablet [id number] No Take 25 mg by mouth at bedtime as needed for allergies. [provider] Taking Active Child           Med Note Sharia Reeve   Sat Dec 01, 2023  1:29 PM) prn  divalproex (DEPAKOTE) 500 MG DR tablet [id number] No Take 1 tablet (500 mg total) by mouth at bedtime. Mort Sawyers, FNP 11/30/2023 Bedtime Active Child  famotidine (PEPCID AC) 10 MG tablet [id number] No Take 10 mg by mouth daily as needed for heartburn or indigestion. [provider] Taking Active Child           Med Note Sharia Reeve   Sat Dec 01, 2023  1:29 PM) prn  furosemide (LASIX) 20 MG tablet [id number] No Take 1 tablet (20 mg total) by mouth daily as needed (for swelling). Denyce Robert, NP 11/30/2023 Active Child  HYDROcodone-acetaminophen (NORCO/VICODIN) 5-325 MG tablet [id number]  Take 1 tablet by mouth 2 (two) times daily as needed for up to 7 days for severe pain (pain score 7-10). Must last 30 days. Marcelino Duster, MD  Expired 12/14/23 2359   hydroxychloroquine (PLAQUENIL) 200 MG tablet [id number] No Take 200 mg by mouth 2 (two) times daily. [provider] 12/01/2023 Active Child  levothyroxine (SYNTHROID) 137 MCG tablet [id number] No Take 1 tablet (137 mcg total) by mouth daily before breakfast. Mort Sawyers, FNP 11/30/2023 Active Child           Med Note Sharia Reeve   JSE Dec 01, 2023  1:31 PM) Takes 137 mcg 6 days a week Sunday through Friday   loratadine (CLARITIN) 10 MG tablet [id number] No Take 1 tablet (10 mg total) by mouth daily. Mort Sawyers, FNP 12/01/2023 Morning Active Child  losartan (COZAAR)  25 MG tablet [id number]  Take 1 tablet (25 mg total) by mouth daily. Marcelino Duster, MD  Active   magnesium oxide (MAG-OX) 400 (240 Mg) MG tablet [id number] No Take 400 mg by mouth daily. [provider] 12/01/2023 Active Child  metoprolol succinate (TOPROL-XL) 100 MG 24 hr tablet [id number] No Take 0.5 tablets (50 mg total) by  mouth daily. Take with or immediately following a meal. Rise Paganini L, NP 12/01/2023 Morning Active Child  naloxone (NARCAN) nasal spray 4 mg/0.1 mL [id number] No Place 1 spray into the nose as needed for up to 365 doses (for opioid-induced respiratory depresssion). In case of emergency (overdose), spray once into each nostril. If no response within 3 minutes, repeat application and call 911. Delano Metz, MD Taking Active Child           Med Note [facility], FRANCISCO A   Wed Oct 17, 2023  8:16 AM) WARNING: [place] mandated prescription under the "STOP Act of 2017" requiring reversal medication to be available to patient when taking opioid medication capable of inducing respiratory depression and failure. DO NOT REMOVE!  pantoprazole (PROTONIX) 40 MG tablet [id number]  TAKE 1 TABLET BY MOUTH TWICE A DAY Dugal, Tabitha, FNP  Active   polyethylene glycol (MIRALAX / GLYCOLAX) 17 g packet [id number]  Take 17 g by mouth daily as needed. Marcelino Duster, MD  Active   traZODone (DESYREL) 100 MG tablet [id number] No TAKE 1 TABLET BY MOUTH AT BEDTIME AS NEEDED FOR SLEEP Mort Sawyers, FNP 11/30/2023 Bedtime Active Child  Med List Note Sharia Reeve, CPhT 12/01/23 1316): MR 10/18/2023 UDS 04-11-23             Home Care and Equipment/Supplies: Were Home Health Services Ordered?: Yes Name of Home Health Agency:: [facility] Has Agency set up a time to come to your home?: No Any new equipment or medical supplies ordered?: NA  Functional Questionnaire: Do you need assistance with bathing/showering or dressing?: Yes Do you need assistance with meal preparation?: Yes Do you need assistance with eating?: No Do you have difficulty maintaining continence: Yes Do you need assistance with getting out of bed/getting out of a chair/moving?: Yes Do you have difficulty managing or taking your medications?: Yes  Follow up appointments reviewed: PCP Follow-up appointment confirmed?: Yes Date of  PCP follow-up appointment?: 12/31/23 Follow-up Provider: [facility] Follow-up appointment confirmed?: Yes Date of Specialist follow-up appointment?: 01/09/24 Follow-Up Specialty Provider:: cardio Do you need transportation to your follow-up appointment?: No Do you understand care options if your condition(s) worsen?: Yes-patient verbalized understanding    SIGNATURE Karena Addison, LPN [facility] Nurse Health Advisor Direct Dial [phone]

## 2023-12-31 ENCOUNTER — Inpatient Hospital Stay: Admitting: Family

## 2023-12-31 ENCOUNTER — Ambulatory Visit: Admitting: Family

## 2023-12-31 VITALS — BP 150/86 | HR 95 | Temp 98.1°F | Ht 60.0 in | Wt 168.0 lb

## 2023-12-31 DIAGNOSIS — D649 Anemia, unspecified: Secondary | ICD-10-CM

## 2023-12-31 DIAGNOSIS — R296 Repeated falls: Secondary | ICD-10-CM | POA: Diagnosis not present

## 2023-12-31 DIAGNOSIS — K5903 Drug induced constipation: Secondary | ICD-10-CM

## 2023-12-31 MED ORDER — POLYETHYLENE GLYCOL 3350 17 G PO PACK: 17.0000 g | PACK | Freq: Every day | ORAL | 0 refills | Status: AC | PRN

## 2023-12-31 NOTE — Progress Notes (Unsigned)
 Established Patient Office Visit  Subjective:      CC:  Chief Complaint  Patient presents with  . Hospitalization Follow-up    HPI: Carmen [name] is a 83 y.o. female presenting on 12/31/2023 for Hospitalization Follow-up . Went to ER 3/18 for a fall right after physical therapy went ack to her room and slipped in the bathroom and slipped and hit her head. She also hit her left forearm.  CT head, no acute findings.  Degenerative spondylosis  Bony foraminal narrowing   Right knee xray mild ddd no acute abn Left forearm xray, no acute findings.  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 Left supraorbital and forehead soft tissue hematoma   Also went to ER 3/1 as she had been walking to the bathroom and felt dizzy and then fell. When she tried to stand she had left leg weakness.  CT head and xrays were reassuring, sent for MRI. Small acute infarct right  gyrus. Was admitted to [facility] rehab with SNF care. Discharged 3/27 with HH pt and ot eval/treat   LE weakness, went and bought wheelchair and a separate 3:1 commode. Also with a hospital bed to help with transfer. She has a lift bed, walker, and a deluxe Rolator. Has been referred to home health physical therapy, waiting for a phone call.   Last visit with neurology was in 03/2023, they have moved up her f/u to may due to recent admit. 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   Cbc anemia, slightly decreased. No blood in stool.   With constipation, ongoing. Was taking miralax inpatient and wants to know if they can continue.     Social history:  Relevant past medical, surgical, family and social history reviewed and updated as indicated. Interim medical history since our last visit reviewed.  Allergies and medications reviewed and  updated.  DATA REVIEWED: CHART IN EPIC     ROS: Negative unless specifically indicated above in HPI.    Current Outpatient Medications:  .  aspirin EC 81 MG tablet, Take 81 mg by mouth daily. Swallow whole., Disp: , Rfl:  .  atorvastatin (LIPITOR) 40 MG tablet, Take 1 tablet (40 mg total) by mouth at bedtime., Disp: 90 tablet, Rfl: 3 .  CALCIUM PO, Take 1 tablet by mouth daily., Disp: , Rfl:  .  Cholecalciferol (VITAMIN D-3) 25 MCG (1000 UT) CAPS, Take 1,000 Units by mouth daily., Disp: , Rfl:  .  clopidogrel (PLAVIX) 75 MG tablet, Take 1 tablet (75 mg total) by mouth daily., Disp: , Rfl:  .  cyanocobalamin (VITAMIN B12) 500 MCG tablet, Take 500 mcg by mouth daily., Disp: , Rfl:  .  diphenhydrAMINE (BENADRYL) 25 MG tablet, Take 25 mg by mouth at bedtime as needed for allergies., Disp: , Rfl:  .  divalproex (DEPAKOTE) 500 MG DR tablet, Take 1 tablet (500 mg total) by mouth at bedtime., Disp: 90 tablet, Rfl: 3 .  famotidine (PEPCID AC) 10 MG tablet, Take 10 mg by mouth daily as needed for heartburn or indigestion., Disp: , Rfl:  .  furosemide (LASIX) 20 MG tablet, Take 1 tablet (20 mg total) by mouth daily as needed (for swelling)., Disp: 90 tablet, Rfl: 3 .  hydroxychloroquine (PLAQUENIL) 200 MG tablet, Take 200 mg by mouth 2 (two) times  daily., Disp: , Rfl:  .  levothyroxine (SYNTHROID) 137 MCG tablet, Take 1 tablet (137 mcg total) by mouth daily before breakfast., Disp: 90 tablet, Rfl: 3 .  loratadine (CLARITIN) 10 MG tablet, Take 1 tablet (10 mg total) by mouth daily., Disp: 30 tablet, Rfl: 11 .  losartan (COZAAR) 25 MG tablet, Take 1 tablet (25 mg total) by mouth daily., Disp: 30 tablet, Rfl: 2 .  magnesium oxide (MAG-OX) 400 (240 Mg) MG tablet, Take 400 mg by mouth daily., Disp: , Rfl:  .  metoprolol succinate (TOPROL-XL) 100 MG 24 hr tablet, Take 0.5 tablets (50 mg total) by mouth daily. Take with or immediately following a meal., Disp: 45 tablet, Rfl: 3 .  naloxone (NARCAN) nasal  spray 4 mg/0.1 mL, Place 1 spray into the nose as needed for up to 365 doses (for opioid-induced respiratory depresssion). In case of emergency (overdose), spray once into each nostril. If no response within 3 minutes, repeat application and call 911., Disp: 1 each, Rfl: 0 .  pantoprazole (PROTONIX) 40 MG tablet, TAKE 1 TABLET BY MOUTH TWICE A DAY, Disp: 180 tablet, Rfl: 1 .  traZODone (DESYREL) 100 MG tablet, TAKE 1 TABLET BY MOUTH AT BEDTIME AS NEEDED FOR SLEEP, Disp: 90 tablet, Rfl: 3 .  HYDROcodone-acetaminophen (NORCO/VICODIN) 5-325 MG tablet, Take 1 tablet by mouth 2 (two) times daily as needed for up to 7 days for severe pain (pain score 7-10). Must last 30 days., Disp: 14 tablet, Rfl: 0 .  polyethylene glycol (MIRALAX / GLYCOLAX) 17 g packet, Take 17 g by mouth daily as needed., Disp: 14 each, Rfl: 0      Objective:    BP (!) 150/86 (BP Location: Right Arm, Patient Position: Sitting, Cuff Size: Normal)   Pulse 95   Temp 98.1 F (36.7 C) (Temporal)   Ht 5' (1.524 m)   Wt 168 lb (76.2 kg)   SpO2 96%   BMI 32.81 kg/m   Wt Readings from Last 3 Encounters:  12/31/23 168 lb (76.2 kg)  12/18/23 171 lb 1.2 oz (77.6 kg)  12/05/23 171 lb 1.2 oz (77.6 kg)    Physical Exam Constitutional:      General: She is not in acute distress.    Appearance: Normal appearance. She is normal weight. She is not ill-appearing, toxic-appearing or diaphoretic.  HENT:     Head: Normocephalic.  Cardiovascular:     Rate and Rhythm: Normal rate.  Pulmonary:     Effort: Pulmonary effort is normal.  Musculoskeletal:        General: Normal range of motion.  Skin:    Comments: Left hip bruising as well as bruising left upper cheek, left anterior forearm and left elbow   Neurological:     General: No focal deficit present.     Mental Status: She is alert and oriented to person, place, and time. Mental status is at baseline.  Psychiatric:        Mood and Affect: Mood normal.        Behavior: Behavior  normal.        Thought Content: Thought content normal.        Judgment: Judgment normal.          Assessment & Plan:  Multiple falls Assessment & Plan: Reviewed recent ER visit.  Discussed with patient fall precautions and appropriate gait belt use and aids to assist with stability such as her walker and wheelchair as needed. Pt to wear non slip shoes or socks,  no slip rugs, well lighted areas. Pt already with shower chair.     Anemia, unspecified type -     CBC; Future -     IBC + Ferritin; Future  Drug-induced constipation Assessment & Plan: Increase water intake Likely norco vicodin contributing Ok to take miralax prn and stool softener  Increase fiber   Other orders -     Polyethylene Glycol 3350; Take 17 g by mouth daily as needed.  Dispense: 14 each; Refill: 0   Pt appears to be healing well, advised strict fall precautions and follow up with physical therapy to work on stability and generalized weakness.   Return in about 3 months (around 03/31/2024) for follow up anemia.  Mort Sawyers, MSN, APRN, FNP-C [facility]

## 2024-01-01 DIAGNOSIS — R296 Repeated falls: Secondary | ICD-10-CM | POA: Insufficient documentation

## 2024-01-01 DIAGNOSIS — K5903 Drug induced constipation: Secondary | ICD-10-CM | POA: Insufficient documentation

## 2024-01-01 NOTE — Assessment & Plan Note (Signed)
 Reviewed recent ER visit.  Discussed with patient fall precautions and appropriate gait belt use and aids to assist with stability such as her walker and wheelchair as needed. Pt to wear non slip shoes or socks, no slip rugs, well lighted areas. Pt already with shower chair.

## 2024-01-01 NOTE — Assessment & Plan Note (Signed)
 Increase water intake Likely norco vicodin contributing Ok to take miralax prn and stool softener  Increase fiber

## 2024-01-02 DIAGNOSIS — M069 Rheumatoid arthritis, unspecified: Secondary | ICD-10-CM | POA: Diagnosis not present

## 2024-01-02 DIAGNOSIS — Z6831 Body mass index (BMI) 31.0-31.9, adult: Secondary | ICD-10-CM | POA: Diagnosis not present

## 2024-01-02 DIAGNOSIS — I2510 Atherosclerotic heart disease of native coronary artery without angina pectoris: Secondary | ICD-10-CM | POA: Diagnosis not present

## 2024-01-02 DIAGNOSIS — Z9181 History of falling: Secondary | ICD-10-CM | POA: Diagnosis not present

## 2024-01-02 DIAGNOSIS — Z604 Social exclusion and rejection: Secondary | ICD-10-CM | POA: Diagnosis not present

## 2024-01-02 DIAGNOSIS — B9681 Helicobacter pylori [H. pylori] as the cause of diseases classified elsewhere: Secondary | ICD-10-CM | POA: Diagnosis not present

## 2024-01-02 DIAGNOSIS — Z7902 Long term (current) use of antithrombotics/antiplatelets: Secondary | ICD-10-CM | POA: Diagnosis not present

## 2024-01-02 DIAGNOSIS — I739 Peripheral vascular disease, unspecified: Secondary | ICD-10-CM | POA: Diagnosis not present

## 2024-01-02 DIAGNOSIS — I10 Essential (primary) hypertension: Secondary | ICD-10-CM | POA: Diagnosis not present

## 2024-01-02 DIAGNOSIS — G894 Chronic pain syndrome: Secondary | ICD-10-CM | POA: Diagnosis not present

## 2024-01-02 DIAGNOSIS — R296 Repeated falls: Secondary | ICD-10-CM | POA: Diagnosis not present

## 2024-01-02 DIAGNOSIS — I482 Chronic atrial fibrillation, unspecified: Secondary | ICD-10-CM | POA: Diagnosis not present

## 2024-01-02 DIAGNOSIS — Z556 Problems related to health literacy: Secondary | ICD-10-CM | POA: Diagnosis not present

## 2024-01-02 DIAGNOSIS — I6522 Occlusion and stenosis of left carotid artery: Secondary | ICD-10-CM | POA: Diagnosis not present

## 2024-01-02 DIAGNOSIS — I69354 Hemiplegia and hemiparesis following cerebral infarction affecting left non-dominant side: Secondary | ICD-10-CM | POA: Diagnosis not present

## 2024-01-02 DIAGNOSIS — Z8673 Personal history of transient ischemic attack (TIA), and cerebral infarction without residual deficits: Secondary | ICD-10-CM | POA: Diagnosis not present

## 2024-01-02 DIAGNOSIS — Z951 Presence of aortocoronary bypass graft: Secondary | ICD-10-CM | POA: Diagnosis not present

## 2024-01-02 DIAGNOSIS — E66811 Obesity, class 1: Secondary | ICD-10-CM | POA: Diagnosis not present

## 2024-01-02 DIAGNOSIS — I4891 Unspecified atrial fibrillation: Secondary | ICD-10-CM | POA: Diagnosis not present

## 2024-01-02 DIAGNOSIS — E039 Hypothyroidism, unspecified: Secondary | ICD-10-CM | POA: Diagnosis not present

## 2024-01-02 DIAGNOSIS — K219 Gastro-esophageal reflux disease without esophagitis: Secondary | ICD-10-CM | POA: Diagnosis not present

## 2024-01-03 ENCOUNTER — Telehealth: Payer: Self-pay

## 2024-01-03 NOTE — Telephone Encounter (Signed)
 Copied from CRM [id number]. Topic: Referral - Status >> Jan 02, 2024  4:56 PM Cammy Copa D wrote: Reason for CRM: Angie with [facility] called to request Carmen [name]'s Physical Therapy Evaluation to be pushed to the week of the 6th as she is all booked up this week.

## 2024-01-03 NOTE — Telephone Encounter (Signed)
 NOTED

## 2024-01-04 DIAGNOSIS — E039 Hypothyroidism, unspecified: Secondary | ICD-10-CM | POA: Diagnosis not present

## 2024-01-04 DIAGNOSIS — E66811 Obesity, class 1: Secondary | ICD-10-CM | POA: Diagnosis not present

## 2024-01-04 DIAGNOSIS — Z604 Social exclusion and rejection: Secondary | ICD-10-CM | POA: Diagnosis not present

## 2024-01-04 DIAGNOSIS — M069 Rheumatoid arthritis, unspecified: Secondary | ICD-10-CM | POA: Diagnosis not present

## 2024-01-04 DIAGNOSIS — I69354 Hemiplegia and hemiparesis following cerebral infarction affecting left non-dominant side: Secondary | ICD-10-CM | POA: Diagnosis not present

## 2024-01-04 DIAGNOSIS — Z7902 Long term (current) use of antithrombotics/antiplatelets: Secondary | ICD-10-CM | POA: Diagnosis not present

## 2024-01-04 DIAGNOSIS — I739 Peripheral vascular disease, unspecified: Secondary | ICD-10-CM | POA: Diagnosis not present

## 2024-01-04 DIAGNOSIS — I2510 Atherosclerotic heart disease of native coronary artery without angina pectoris: Secondary | ICD-10-CM | POA: Diagnosis not present

## 2024-01-04 DIAGNOSIS — G894 Chronic pain syndrome: Secondary | ICD-10-CM | POA: Diagnosis not present

## 2024-01-04 DIAGNOSIS — I10 Essential (primary) hypertension: Secondary | ICD-10-CM | POA: Diagnosis not present

## 2024-01-04 DIAGNOSIS — Z951 Presence of aortocoronary bypass graft: Secondary | ICD-10-CM | POA: Diagnosis not present

## 2024-01-04 DIAGNOSIS — Z556 Problems related to health literacy: Secondary | ICD-10-CM | POA: Diagnosis not present

## 2024-01-04 DIAGNOSIS — Z6831 Body mass index (BMI) 31.0-31.9, adult: Secondary | ICD-10-CM | POA: Diagnosis not present

## 2024-01-04 DIAGNOSIS — B9681 Helicobacter pylori [H. pylori] as the cause of diseases classified elsewhere: Secondary | ICD-10-CM | POA: Diagnosis not present

## 2024-01-04 DIAGNOSIS — K219 Gastro-esophageal reflux disease without esophagitis: Secondary | ICD-10-CM | POA: Diagnosis not present

## 2024-01-04 DIAGNOSIS — I4891 Unspecified atrial fibrillation: Secondary | ICD-10-CM | POA: Diagnosis not present

## 2024-01-07 DIAGNOSIS — I69354 Hemiplegia and hemiparesis following cerebral infarction affecting left non-dominant side: Secondary | ICD-10-CM | POA: Diagnosis not present

## 2024-01-07 DIAGNOSIS — Z6831 Body mass index (BMI) 31.0-31.9, adult: Secondary | ICD-10-CM | POA: Diagnosis not present

## 2024-01-07 DIAGNOSIS — B9681 Helicobacter pylori [H. pylori] as the cause of diseases classified elsewhere: Secondary | ICD-10-CM | POA: Diagnosis not present

## 2024-01-07 DIAGNOSIS — K219 Gastro-esophageal reflux disease without esophagitis: Secondary | ICD-10-CM | POA: Diagnosis not present

## 2024-01-07 DIAGNOSIS — Z951 Presence of aortocoronary bypass graft: Secondary | ICD-10-CM | POA: Diagnosis not present

## 2024-01-07 DIAGNOSIS — E039 Hypothyroidism, unspecified: Secondary | ICD-10-CM | POA: Diagnosis not present

## 2024-01-07 DIAGNOSIS — I2510 Atherosclerotic heart disease of native coronary artery without angina pectoris: Secondary | ICD-10-CM | POA: Diagnosis not present

## 2024-01-07 DIAGNOSIS — Z604 Social exclusion and rejection: Secondary | ICD-10-CM | POA: Diagnosis not present

## 2024-01-07 DIAGNOSIS — G894 Chronic pain syndrome: Secondary | ICD-10-CM | POA: Diagnosis not present

## 2024-01-07 DIAGNOSIS — I739 Peripheral vascular disease, unspecified: Secondary | ICD-10-CM | POA: Diagnosis not present

## 2024-01-07 DIAGNOSIS — Z7902 Long term (current) use of antithrombotics/antiplatelets: Secondary | ICD-10-CM | POA: Diagnosis not present

## 2024-01-07 DIAGNOSIS — M069 Rheumatoid arthritis, unspecified: Secondary | ICD-10-CM | POA: Diagnosis not present

## 2024-01-07 DIAGNOSIS — E66811 Obesity, class 1: Secondary | ICD-10-CM | POA: Diagnosis not present

## 2024-01-07 DIAGNOSIS — I4891 Unspecified atrial fibrillation: Secondary | ICD-10-CM | POA: Diagnosis not present

## 2024-01-07 DIAGNOSIS — Z556 Problems related to health literacy: Secondary | ICD-10-CM | POA: Diagnosis not present

## 2024-01-07 DIAGNOSIS — I10 Essential (primary) hypertension: Secondary | ICD-10-CM | POA: Diagnosis not present

## 2024-01-08 ENCOUNTER — Telehealth: Payer: Self-pay | Admitting: Family

## 2024-01-08 NOTE — Telephone Encounter (Signed)
 Copied from CRM [id number]. Topic: Clinical - Home Health Verbal Orders >> Jan 08, 2024  2:58 PM Izetta Dakin wrote: Caller/Agency: Jatana King/ [facility] Callback Number: [phone] Service Requested: Physical Therapy Frequency: 1x1, 2x3, 1x4 Any new concerns about the patient? No

## 2024-01-09 ENCOUNTER — Ambulatory Visit: Admitting: Medical

## 2024-01-09 ENCOUNTER — Encounter: Payer: Medicare Other | Admitting: Pain Medicine

## 2024-01-09 DIAGNOSIS — B9681 Helicobacter pylori [H. pylori] as the cause of diseases classified elsewhere: Secondary | ICD-10-CM | POA: Diagnosis not present

## 2024-01-09 DIAGNOSIS — E66811 Obesity, class 1: Secondary | ICD-10-CM | POA: Diagnosis not present

## 2024-01-09 DIAGNOSIS — M069 Rheumatoid arthritis, unspecified: Secondary | ICD-10-CM | POA: Diagnosis not present

## 2024-01-09 DIAGNOSIS — G894 Chronic pain syndrome: Secondary | ICD-10-CM | POA: Diagnosis not present

## 2024-01-09 DIAGNOSIS — Z556 Problems related to health literacy: Secondary | ICD-10-CM | POA: Diagnosis not present

## 2024-01-09 DIAGNOSIS — Z7902 Long term (current) use of antithrombotics/antiplatelets: Secondary | ICD-10-CM | POA: Diagnosis not present

## 2024-01-09 DIAGNOSIS — Z604 Social exclusion and rejection: Secondary | ICD-10-CM | POA: Diagnosis not present

## 2024-01-09 DIAGNOSIS — I4891 Unspecified atrial fibrillation: Secondary | ICD-10-CM | POA: Diagnosis not present

## 2024-01-09 DIAGNOSIS — Z951 Presence of aortocoronary bypass graft: Secondary | ICD-10-CM | POA: Diagnosis not present

## 2024-01-09 DIAGNOSIS — E039 Hypothyroidism, unspecified: Secondary | ICD-10-CM | POA: Diagnosis not present

## 2024-01-09 DIAGNOSIS — I739 Peripheral vascular disease, unspecified: Secondary | ICD-10-CM | POA: Diagnosis not present

## 2024-01-09 DIAGNOSIS — I2510 Atherosclerotic heart disease of native coronary artery without angina pectoris: Secondary | ICD-10-CM | POA: Diagnosis not present

## 2024-01-09 DIAGNOSIS — Z6831 Body mass index (BMI) 31.0-31.9, adult: Secondary | ICD-10-CM | POA: Diagnosis not present

## 2024-01-09 DIAGNOSIS — I69354 Hemiplegia and hemiparesis following cerebral infarction affecting left non-dominant side: Secondary | ICD-10-CM | POA: Diagnosis not present

## 2024-01-09 DIAGNOSIS — K219 Gastro-esophageal reflux disease without esophagitis: Secondary | ICD-10-CM | POA: Diagnosis not present

## 2024-01-09 DIAGNOSIS — I10 Essential (primary) hypertension: Secondary | ICD-10-CM | POA: Diagnosis not present

## 2024-01-09 NOTE — Telephone Encounter (Signed)
 Attempted to contact Carmen [name] again. Call went straight to VM again and her VM is still full.

## 2024-01-09 NOTE — Telephone Encounter (Addendum)
 Attempted to contact [place] with [facility]. Call went straight to VM and it was full. Will try back.

## 2024-01-10 ENCOUNTER — Ambulatory Visit: Admitting: Medical

## 2024-01-10 DIAGNOSIS — Z6831 Body mass index (BMI) 31.0-31.9, adult: Secondary | ICD-10-CM | POA: Diagnosis not present

## 2024-01-10 DIAGNOSIS — Z604 Social exclusion and rejection: Secondary | ICD-10-CM | POA: Diagnosis not present

## 2024-01-10 DIAGNOSIS — K219 Gastro-esophageal reflux disease without esophagitis: Secondary | ICD-10-CM | POA: Diagnosis not present

## 2024-01-10 DIAGNOSIS — B9681 Helicobacter pylori [H. pylori] as the cause of diseases classified elsewhere: Secondary | ICD-10-CM | POA: Diagnosis not present

## 2024-01-10 DIAGNOSIS — M069 Rheumatoid arthritis, unspecified: Secondary | ICD-10-CM | POA: Diagnosis not present

## 2024-01-10 DIAGNOSIS — G894 Chronic pain syndrome: Secondary | ICD-10-CM | POA: Diagnosis not present

## 2024-01-10 DIAGNOSIS — I10 Essential (primary) hypertension: Secondary | ICD-10-CM | POA: Diagnosis not present

## 2024-01-10 DIAGNOSIS — Z556 Problems related to health literacy: Secondary | ICD-10-CM | POA: Diagnosis not present

## 2024-01-10 DIAGNOSIS — Z7902 Long term (current) use of antithrombotics/antiplatelets: Secondary | ICD-10-CM | POA: Diagnosis not present

## 2024-01-10 DIAGNOSIS — I2510 Atherosclerotic heart disease of native coronary artery without angina pectoris: Secondary | ICD-10-CM | POA: Diagnosis not present

## 2024-01-10 DIAGNOSIS — I69354 Hemiplegia and hemiparesis following cerebral infarction affecting left non-dominant side: Secondary | ICD-10-CM | POA: Diagnosis not present

## 2024-01-10 DIAGNOSIS — I739 Peripheral vascular disease, unspecified: Secondary | ICD-10-CM | POA: Diagnosis not present

## 2024-01-10 DIAGNOSIS — I4891 Unspecified atrial fibrillation: Secondary | ICD-10-CM | POA: Diagnosis not present

## 2024-01-10 DIAGNOSIS — E66811 Obesity, class 1: Secondary | ICD-10-CM | POA: Diagnosis not present

## 2024-01-10 DIAGNOSIS — Z951 Presence of aortocoronary bypass graft: Secondary | ICD-10-CM | POA: Diagnosis not present

## 2024-01-10 DIAGNOSIS — E039 Hypothyroidism, unspecified: Secondary | ICD-10-CM | POA: Diagnosis not present

## 2024-01-10 NOTE — Telephone Encounter (Signed)
 Attempted to contact Carmen [name] x3 and her VM is still full.

## 2024-01-11 ENCOUNTER — Ambulatory Visit: Attending: Pain Medicine | Admitting: Nurse Practitioner

## 2024-01-11 VITALS — BP 145/80 | HR 72 | Temp 97.5°F | Resp 16 | Ht 60.0 in | Wt 168.0 lb

## 2024-01-11 DIAGNOSIS — Z79899 Other long term (current) drug therapy: Secondary | ICD-10-CM

## 2024-01-11 DIAGNOSIS — M542 Cervicalgia: Secondary | ICD-10-CM

## 2024-01-11 DIAGNOSIS — M545 Low back pain, unspecified: Secondary | ICD-10-CM

## 2024-01-11 DIAGNOSIS — M47816 Spondylosis without myelopathy or radiculopathy, lumbar region: Secondary | ICD-10-CM

## 2024-01-11 DIAGNOSIS — G8929 Other chronic pain: Secondary | ICD-10-CM | POA: Diagnosis not present

## 2024-01-11 DIAGNOSIS — Z79891 Long term (current) use of opiate analgesic: Secondary | ICD-10-CM

## 2024-01-11 DIAGNOSIS — M79604 Pain in right leg: Secondary | ICD-10-CM

## 2024-01-11 DIAGNOSIS — M79605 Pain in left leg: Secondary | ICD-10-CM | POA: Diagnosis not present

## 2024-01-11 DIAGNOSIS — G894 Chronic pain syndrome: Secondary | ICD-10-CM

## 2024-01-11 MED ORDER — HYDROCODONE-ACETAMINOPHEN 5-325 MG PO TABS: 1.0000 | ORAL_TABLET | Freq: Two times a day (BID) | ORAL | 0 refills | Status: DC | PRN

## 2024-01-11 NOTE — Patient Instructions (Addendum)
 Pain Management Discharge Instructions  General Discharge Instructions :  If you need to reach your doctor call: Monday-Friday 8:00 am - 4:00 pm at [phone] or toll free [phone].  After clinic hours [phone] to have operator reach doctor.  Bring all of your medication bottles to all your appointments in the pain clinic.  To cancel or reschedule your appointment with Pain Management please remember to call 24 hours in advance to avoid a fee.  Refer to the educational materials which you have been given on: General Risks, I had my Procedure. Discharge Instructions, Post Sedation.  Post Procedure Instructions:  The drugs you were given will stay in your system until tomorrow, so for the next 24 hours you should not drive, make any legal decisions or drink any alcoholic beverages.  You may eat anything you prefer, but it is better to start with liquids then soups and crackers, and gradually work up to solid foods.  Please notify your doctor immediately if you have any unusual bleeding, trouble breathing or pain that is not related to your normal pain.  Depending on the type of procedure that was done, some parts of your body may feel week and/or numb.  This usually clears up by tonight or the next day.  Walk with the use of an assistive device or accompanied by an adult for the 24 hours.  You may use ice on the affected area for the first 24 hours.  Put ice in a Ziploc bag and cover with a towel and place against area 15 minutes on 15 minutes off.  You may switch to heat after 24 hours. ______________________________________________________________________    Opioid Pain Medication Update  To: All patients taking opioid pain medications. (I.e.: hydrocodone, hydromorphone, oxycodone, oxymorphone, morphine, codeine, methadone, tapentadol, tramadol, buprenorphine, fentanyl, etc.)  Re: Updated review of side effects and adverse reactions of opioid analgesics, as well as new  information about long term effects of this class of medications.  Direct risks of long-term opioid therapy are not limited to opioid addiction and overdose. Potential medical risks include serious fractures, breathing problems during sleep, hyperalgesia, immunosuppression, chronic constipation, bowel obstruction, myocardial infarction, and tooth decay secondary to xerostomia.  Unpredictable adverse effects that can occur even if you take your medication correctly: Cognitive impairment, respiratory depression, and death. Most people think that if they take their medication "correctly", and "as instructed", that they will be safe. Nothing could be farther from the truth. In reality, a significant amount of recorded deaths associated with the use of opioids has occurred in individuals that had taken the medication for a long time, and were taking their medication correctly. The following are examples of how this can happen: Patient taking his/her medication for a long time, as instructed, without any side effects, is given a certain antibiotic or another unrelated medication, which in turn triggers a "Drug-to-drug interaction" leading to disorientation, cognitive impairment, impaired reflexes, respiratory depression or an untoward event leading to serious bodily harm or injury, including death.  Patient taking his/her medication for a long time, as instructed, without any side effects, develops an acute impairment of liver and/or kidney function. This will lead to a rapid inability of the body to breakdown and eliminate their pain medication, which will result in effects similar to an "overdose", but with the same medicine and dose that they had always taken. This again may lead to disorientation, cognitive impairment, impaired reflexes, respiratory depression or an untoward event leading to serious bodily harm or injury, including  death.  A similar problem will occur with patients as they grow older and their  liver and kidney function begins to decrease as part of the aging process.  Background information: Historically, the original case for using long-term opioid therapy to treat chronic noncancer pain was based on safety assumptions that subsequent experience has called into question. In 1996, the American Pain Society and the American Academy of Pain Medicine issued a consensus statement supporting long-term opioid therapy. This statement acknowledged the dangers of opioid prescribing but concluded that the risk for addiction was low; respiratory depression induced by opioids was short-lived, occurred mainly in opioid-naive patients, and was antagonized by pain; tolerance was not a common problem; and efforts to control diversion should not constrain opioid prescribing. This has now proven to be wrong. Experience regarding the risks for opioid addiction, misuse, and overdose in community practice has failed to support these assumptions.  According to the Centers for Disease Control and Prevention, fatal overdoses involving opioid analgesics have increased sharply over the past decade. Currently, more than 96,700 people die from drug overdoses every year. Opioids are a factor in 7 out of every 10 overdose deaths. Deaths from drug overdose have surpassed motor vehicle accidents as the leading cause of death for individuals between the ages of 48 and 37.  Clinical data suggest that neuroendocrine dysfunction may be very common in both men and women, potentially causing hypogonadism, erectile dysfunction, infertility, decreased libido, osteoporosis, and depression. Recent studies linked higher opioid dose to increased opioid-related mortality. Controlled observational studies reported that long-term opioid therapy may be associated with increased risk for cardiovascular events. Subsequent meta-analysis concluded that the safety of long-term opioid therapy in elderly patients has not been proven.   Side Effects  and adverse reactions: Common side effects: Drowsiness (sedation). Dizziness. Nausea and vomiting. Constipation. Physical dependence -- Dependence often manifests with withdrawal symptoms when opioids are discontinued or decreased. Tolerance -- As you take repeated doses of opioids, you require increased medication to experience the same effect of pain relief. Respiratory depression -- This can occur in healthy people, especially with higher doses. However, people with COPD, asthma or other lung conditions may be even more susceptible to fatal respiratory impairment.  Uncommon side effects: An increased sensitivity to feeling pain and extreme response to pain (hyperalgesia). Chronic use of opioids can lead to this. Delayed gastric emptying (the process by which the contents of your stomach are moved into your small intestine). Muscle rigidity. Immune system and hormonal dysfunction. Quick, involuntary muscle jerks (myoclonus). Arrhythmia. Itchy skin (pruritus). Dry mouth (xerostomia).  Long-term side effects: Chronic constipation. Sleep-disordered breathing (SDB). Increased risk of bone fractures. Hypothalamic-pituitary-adrenal dysregulation. Increased risk of overdose.  RISKS: Respiratory depression and death: Opioids increase the risk of respiratory depression and death.  Drug-to-drug interactions: Opioids are relatively contraindicated in combination with benzodiazepines, sleep inducers, and other central nervous system depressants. Other classes of medications (i.e.: certain antibiotics and even over-the-counter medications) may also trigger or induce respiratory depression in some patients.  Medical conditions: Patients with pre-existing respiratory problems are at higher risk of respiratory failure and/or depression when in combination with opioid analgesics. Opioids are relatively contraindicated in some medical conditions such as central sleep apnea.   Fractures and Falls:   Opioids increase the risk and incidence of falls. This is of particular importance in elderly patients.  Endocrine System:  Long-term administration is associated with endocrine abnormalities (endocrinopathies). (Also known as Opioid-induced Endocrinopathy) Influences on both the hypothalamic-pituitary-adrenal axis?and the hypothalamic-pituitary-gonadal  axis have been demonstrated with consequent hypogonadism and adrenal insufficiency in both sexes. Hypogonadism and decreased levels of dehydroepiandrosterone sulfate have been reported in men and women. Endocrine effects include: Amenorrhoea in women (abnormal absence of menstruation) Reduced libido in both sexes Decreased sexual function Erectile dysfunction in men Hypogonadisms (decreased testicular function with shrinkage of testicles) Infertility Depression and fatigue Loss of muscle mass Anxiety Depression Immune suppression Hyperalgesia Weight gain Anemia Osteoporosis Patients (particularly women of childbearing age) should avoid opioids. There is insufficient evidence to recommend routine monitoring of asymptomatic patients taking opioids in the long-term for hormonal deficiencies.  Immune System: Human studies have demonstrated that opioids have an immunomodulating effect. These effects are mediated via opioid receptors both on immune effector cells and in the central nervous system. Opioids have been demonstrated to have adverse effects on antimicrobial response and anti-tumour surveillance. Buprenorphine has been demonstrated to have no impact on immune function.  Opioid Induced Hyperalgesia: Human studies have demonstrated that prolonged use of opioids can lead to a state of abnormal pain sensitivity, sometimes called opioid induced hyperalgesia (OIH). Opioid induced hyperalgesia is not usually seen in the absence of tolerance to opioid analgesia. Clinically, hyperalgesia may be diagnosed if the patient on long-term  opioid therapy presents with increased pain. This might be qualitatively and anatomically distinct from pain related to disease progression or to breakthrough pain resulting from development of opioid tolerance. Pain associated with hyperalgesia tends to be more diffuse than the pre-existing pain and less defined in quality. Management of opioid induced hyperalgesia requires opioid dose reduction.  Cancer: Chronic opioid therapy has been associated with an increased risk of cancer among noncancer patients with chronic pain. This association was more evident in chronic strong opioid users. Chronic opioid consumption causes significant pathological changes in the small intestine and colon. Epidemiological studies have found that there is a link between opium dependence and initiation of gastrointestinal cancers. Cancer is the second leading cause of death after cardiovascular disease. Chronic use of opioids can cause multiple conditions such as GERD, immunosuppression and renal damage as well as carcinogenic effects, which are associated with the incidence of cancers.   Mortality: Long-term opioid use has been associated with increased mortality among patients with chronic non-cancer pain (CNCP).  Prescription of long-acting opioids for chronic noncancer pain was associated with a significantly increased risk of all-cause mortality, including deaths from causes other than overdose.  Reference: Von Korff M, Kolodny A, Deyo RA, Chou R. Long-term opioid therapy reconsidered. Ann Intern Med. 2011 Sep 6;155(5):325-8. doi: 10.7326/[id number]. PMID: 78295621; PMCID: [id number]. Randon Goldsmith, Hayward RA, Dunn KM, [place] KP. Risk of adverse events in patients prescribed long-term opioids: A cohort study in the [place] Clinical Practice Research Datalink. Eur J Pain. 2019 May;23(5):908-922. doi: 10.1002/ejp.1357. Epub 2019 Jan 31. PMID: 62952841. Colameco S, Coren JS, [place] CA.  Continuous opioid treatment for chronic noncancer pain: a time for moderation in prescribing. Postgrad Med. 2009 Jul;121(4):61-6. doi: 10.3810/pgm.2009.07.2032. PMID: 32440102. William Hamburger RN, [place], Blazina I, Cristopher [place], Bougatsos C, Deyo RA. The effectiveness and risks of long-term opioid therapy for chronic pain: a systematic review for a [employer] of [employer] Pathways to [employer]. Ann Intern Med. 2015 Feb 17;162(4):276-86. doi: 10.7326/M14-2559. PMID: 72536644. [facility], [place] DM. NCHS Data Brief No. 22. [place]: Centers for Disease Control and Prevention; 2009. Sep, Increase in Fatal Poisonings Involving Opioid Analgesics in the [place], 1999-2006. Geraldine Contras, Choi HR, Oh  TK. Long-term opioid use and mortality in patients with chronic non-cancer pain: Ten-year follow-up study in [place] from 2010 through 2019. EClinicalMedicine. 2022 Jul 18;51:101558. doi: 10.1016/j.eclinm.2022.409811. PMID: 91478295; PMCID: [id number]. Huser, W., Schubert, T., Vogelmann, T. et al. All-cause mortality in patients with long-term opioid therapy compared with non-opioid analgesics for chronic non-cancer pain: a database study. BMC Med 18, 162 (2020). http://lester.info/ Rashidian H, Karie Kirks, Malekzadeh R, Haghdoost AA. An Ecological Study of the Association between Opiate Use and Incidence of Cancers. Addict Health. 2016 Fall;8(4):252-260. PMID: 46962952; PMCID: [id number].  Our Goal: Our goal is to control your pain with means other than the use of opioid pain medications.  Our Recommendation: Talk to your physician about coming off of these medications. We can assist you with the tapering down and stopping these medicines. Based on the new information, even if you cannot completely stop the medication, a decrease in the dose may be associated with a lesser risk. Ask for other means of controlling the pain. Decrease  or eliminate those factors that significantly contribute to your pain such as smoking, obesity, and a diet heavily tilted towards "inflammatory" nutrients.  Last Updated: 04/09/2023   ______________________________________________________________________       ______________________________________________________________________    Medication Rules  Purpose: To inform patients, and their family members, of our medication rules and regulations.  Applies to: All patients receiving prescriptions from our practice (written or electronic).  Pharmacy of record: This is the pharmacy where your electronic prescriptions will be sent. Make sure we have the correct one.  Electronic prescriptions: In compliance with the [facility] Strengthen Opioid Misuse Prevention (STOP) Act of 2017 (Session Conni Elliot [phone]), effective October 02, 2018, all controlled substances must be electronically prescribed. Written prescriptions, faxing, or calling prescriptions to a pharmacy will no longer be done.  Prescription refills: These will be provided only during in-person appointments. No medications will be renewed without a "face-to-face" evaluation with your provider. Applies to all prescriptions.  NOTE: The following applies primarily to controlled substances (Opioid* Pain Medications).   Type of encounter (visit): For patients receiving controlled substances, face-to-face visits are required. (Not an option and not up to the patient.)  Patient's Responsibilities: Pain Pills: Bring all pain pills to every appointment (except for procedure appointments). Pill counts are required.  Pill Bottles: Bring pills in original pharmacy bottle. Bring bottle, even if empty. Always bring the bottle of the most recent fill.  Medication refills: You are responsible for knowing and keeping track of what medications you are taking and when is it that you will need a refill. The day before your appointment: write a list  of all prescriptions that need to be refilled. The day of the appointment: give the list to the admitting nurse. Prescriptions will be written only during appointments. No prescriptions will be written on procedure days. If you forget a medication: it will not be "Called in", "Faxed", or "electronically sent". You will need to get another appointment to get these prescribed. No early refills. Do not call asking to have your prescription filled early. Partial  or short prescriptions: Occasionally your pharmacy may not have enough pills to fill your prescription.  NEVER ACCEPT a partial fill or a prescription that is short of the total amount of pills that you were prescribed.  With controlled substances the law allows 72 hours for the pharmacy to complete the prescription.  If the prescription is not completed within 72 hours, the pharmacist will require a new prescription  to be written. This means that you will be short on your medicine and we WILL NOT send another prescription to complete your original prescription.  Instead, request the pharmacy to send a carrier to a nearby branch to get enough medication to provide you with your full prescription. Prescription Accuracy: You are responsible for carefully inspecting your prescriptions before leaving our office. Have the discharge nurse carefully go over each prescription with you, before taking them home. Make sure that your name is accurately spelled, that your address is correct. Check the name and dose of your medication to make sure it is accurate. Check the number of pills, and the written instructions to make sure they are clear and accurate. Make sure that you are given enough medication to last until your next medication refill appointment. Taking Medication: Take medication as prescribed. When it comes to controlled substances, taking less pills or less frequently than prescribed is permitted and encouraged. Never take more pills than  instructed. Never take the medication more frequently than prescribed.  Inform other Doctors: Always inform, all of your healthcare providers, of all the medications you take. Pain Medication from other Providers: You are not allowed to accept any additional pain medication from any other Doctor or Healthcare provider. There are two exceptions to this rule. (see below) In the event that you require additional pain medication, you are responsible for notifying us, as stated below. Cough Medicine: Often these contain an opioid, such as codeine or hydrocodone. Never accept or take cough medicine containing these opioids if you are already taking an opioid* medication. The combination may cause respiratory failure and death. Medication Agreement: You are responsible for carefully reading and following our Medication Agreement. This must be signed before receiving any prescriptions from our practice. Safely store a copy of your signed Agreement. Violations to the Agreement will result in no further prescriptions. (Additional copies of our Medication Agreement are available upon request.) Laws, Rules, & Regulations: All patients are expected to follow all [address] and [employer], Rules, [employer]. Ignorance of the Laws does not constitute a valid excuse.  Illegal drugs and Controlled Substances: The use of illegal substances (including, but not limited to marijuana and its derivatives) and/or the illegal use of any controlled substances is strictly prohibited. Violation of this rule may result in the immediate and permanent discontinuation of any and all prescriptions being written by our practice. The use of any illegal substances is prohibited. Adopted CDC guidelines & recommendations: Target dosing levels will be at or below 60 MME/day. Use of benzodiazepines** is not recommended. Urine Drug testing: Patients taking controlled substances will be required to provide a urine sample upon request. Do  not void before coming to your medication management appointments. Hold emptying your bladder until you are admitted. The admitting nurse will inform you if a sample is required. Our practice reserves the right to call you at any time to provide a sample. Once receiving the call, you have 24 hours to comply with request. Not providing a sample upon request may result in termination of medication therapy.  Exceptions: There are only two exceptions to the rule of not receiving pain medications from other Healthcare Providers. Exception #1 (Emergencies): In the event of an emergency (i.e.: accident requiring emergency care), you are allowed to receive additional pain medication. However, you are responsible for: As soon as you are able, call our office [phone], at any time of the day or night, and leave a message stating  your name, the date and nature of the emergency, and the name and dose of the medication prescribed. In the event that your call is answered by a member of our staff, make sure to document and save the date, time, and the name of the person that took your information.  Exception #2 (Planned Surgery): In the event that you are scheduled by another doctor or dentist to have any type of surgery or procedure, you are allowed (for a period no longer than 30 days), to receive additional pain medication, for the acute post-op pain. However, in this case, you are responsible for picking up a copy of our "Post-op Pain Management for Surgeons" handout, and giving it to your surgeon or dentist. This document is available at our office, and does not require an appointment to obtain it. Simply go to our office during business hours (Monday-Thursday from 8:00 AM to 4:00 PM) (Friday 8:00 AM to 12:00 Noon) or if you have a scheduled appointment with [place], prior to your surgery, and ask for it by name. In addition, you are responsible for: calling our office [phone], at any time of the day or night, and  leaving a message stating your name, name of your surgeon, type of surgery, and date of procedure or surgery. Failure to comply with your responsibilities may result in termination of therapy involving the controlled substances.  Consequences:  Non-compliance with the above rules may result in permanent discontinuation of medication prescription therapy. All patients receiving any type of controlled substance is expected to comply with the above patient responsibilities. Not doing so may result in permanent discontinuation of medication prescription therapy. Medication Agreement Violation. Following the above rules, including your responsibilities will help you in avoiding a Medication Agreement Violation ("Breaking your Pain Medication Contract").  *Opioid medications include: morphine, codeine, oxycodone, oxymorphone, hydrocodone, hydromorphone, meperidine, tramadol, tapentadol, buprenorphine, fentanyl, methadone. **Benzodiazepine medications include: diazepam (Valium), alprazolam (Xanax), clonazepam (Klonopine), lorazepam (Ativan), clorazepate (Tranxene), chlordiazepoxide (Librium), estazolam (Prosom), oxazepam (Serax), temazepam (Restoril), triazolam (Halcion) (Last updated: 07/25/2023) ______________________________________________________________________      ______________________________________________________________________    Medication Recommendations and Reminders  Applies to: All patients receiving prescriptions (written and/or electronic).  Medication Rules & Regulations: You are responsible for reading, knowing, and following our "Medication Rules" document. These exist for your safety and that of others. They are not flexible and neither are we. Dismissing or ignoring them is an act of "non-compliance" that may result in complete and irreversible termination of such medication therapy. For safety reasons, "non-compliance" will not be tolerated. As with the [place] fundamental legal  principle of "ignorance of the law is no defense", we will accept no excuses for not having read and knowing the content of documents provided to you by our practice.  Pharmacy of record:  Definition: This is the pharmacy where your electronic prescriptions will be sent.  We do not endorse any particular pharmacy. It is up to you and your insurance to decide what pharmacy to use.  We do not restrict you in your choice of pharmacy. However, once we write for your prescriptions, we will NOT be re-sending more prescriptions to fix restricted supply problems created by your pharmacy, or your insurance.  The pharmacy listed in the electronic medical record should be the one where you want electronic prescriptions to be sent. If you choose to change pharmacy, simply notify our nursing staff. Changes will be made only during your regular appointments and not over the phone.  Recommendations: Keep all of  your pain medications in a safe place, under lock and key, even if you live alone. We will NOT replace lost, stolen, or damaged medication. We do not accept "Police Reports" as proof of medications having been stolen. After you fill your prescription, take 1 week's worth of pills and put them away in a safe place. You should keep a separate, properly labeled bottle for this purpose. The remainder should be kept in the original bottle. Use this as your primary supply, until it runs out. Once it's gone, then you know that you have 1 week's worth of medicine, and it is time to come in for a prescription refill. If you do this correctly, it is unlikely that you will ever run out of medicine. To make sure that the above recommendation works, it is very important that you make sure your medication refill appointments are scheduled at least 1 week before you run out of medicine. To do this in an effective manner, make sure that you do not leave the office without scheduling your next medication management appointment.  Always ask the nursing staff to show you in your prescription , when your medication will be running out. Then arrange for the receptionist to get you a return appointment, at least 7 days before you run out of medicine. Do not wait until you have 1 or 2 pills left, to come in. This is very poor planning and does not take into consideration that we may need to cancel appointments due to bad weather, sickness, or emergencies affecting our staff. DO NOT ACCEPT A "Partial Fill": If for any reason your pharmacy does not have enough pills/tablets to completely fill or refill your prescription, do not allow for a "partial fill". The law allows the pharmacy to complete that prescription within 72 hours, without requiring a new prescription. If they do not fill the rest of your prescription within those 72 hours, you will need a separate prescription to fill the remaining amount, which we will NOT provide. If the reason for the partial fill is your insurance, you will need to talk to the pharmacist about payment alternatives for the remaining tablets, but again, DO NOT ACCEPT A PARTIAL FILL, unless you can trust your pharmacist to obtain the remainder of the pills within 72 hours.  Prescription refills and/or changes in medication(s):  Prescription refills, and/or changes in dose or medication, will be conducted only during scheduled medication management appointments. (Applies to both, written and electronic prescriptions.) No refills on procedure days. No medication will be changed or started on procedure days. No changes, adjustments, and/or refills will be conducted on a procedure day. Doing so will interfere with the diagnostic portion of the procedure. No phone refills. No medications will be "called into the pharmacy". No Fax refills. No weekend refills. No Holliday refills. No after hours refills.  Remember:  Business hours are:  Monday to Thursday 8:00 AM to 4:00 PM Provider's Schedule: Delano Metz, MD - Appointments are:  Medication management: Monday and Wednesday 8:00 AM to 4:00 PM Procedure day: Tuesday and Thursday 7:30 AM to 4:00 PM Edward Jolly, MD - Appointments are:  Medication management: Tuesday and Thursday 8:00 AM to 4:00 PM Procedure day: Monday and Wednesday 7:30 AM to 4:00 PM (Last update: 07/25/2022) ______________________________________________________________________

## 2024-01-11 NOTE — Progress Notes (Signed)
 PROVIDER NOTE: Interpretation of information contained herein should be left to medically-trained personnel. Specific patient instructions are provided elsewhere under "Patient Instructions" section of medical record. This document was created in part using AI and STT-dictation technology, any transcriptional errors that may result from this process are unintentional.  Patient: Carmen [name]  Service: E/M   PCP: Mort Sawyers, FNP  DOB: 02-10-1941  DOS: 01/11/2024  Provider: Bettey Costa, NP  MRN: [id number]  Delivery: Face-to-face  Specialty: Interventional Pain Management  Type: Established Patient  Setting: Ambulatory outpatient facility  Specialty designation: 09  Referring Prov.: Mort Sawyers, FNP  Location: Outpatient office facility       HPI  Carmen [name], a 83 y.o. year old female, is here today because of her Chronic bilateral low back pain without sciatica [M54.50, G89.29]. Carmen [name] primary complain today is Leg Pain (LEFT SIDE WORSE)  Pain Assessment: Severity of Chronic pain is reported as a 7 /10. Location: Leg Right, Left/down legs somethingsto ankle. Onset: More than a month ago. Quality: Aching, Discomfort, Crushing, Radiating, Restless. Timing:  . Modifying factor(s): rest, meds. Vitals:  height is 5' (1.524 m) and weight is 168 lb (76.2 kg). Her temperature is 97.5 F (36.4 C) (abnormal). Her blood pressure is 145/80 (abnormal) and her pulse is 72. Her respiration is 16 and oxygen saturation is 100%.  BMI: Estimated body mass index is 32.81 kg/m as calculated from the following:   Height as of this encounter: 5' (1.524 m).   Weight as of this encounter: 168 lb (76.2 kg). Last encounter: Visit date not found. Last procedure: Visit date not found.  Reason for encounter: medication management.  The patient indicates doing well with the current medication regimen.  No adverse reaction or side effects reported to the medication. Patient compliance with her medication regimen  and prescription drug monitoring program (PDMP).  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 She she also present with bruising around the left eye.  Pharmacotherapy Assessment  Analgesic: Hydrocodone-acetaminophen (Norco/Vicodin) 5-325 mg 1 tab 2 times daily as needed for pain. MME=10  Monitoring: [place] PMP: PDMP reviewed during this encounter.       Pharmacotherapy: No side-effects or adverse reactions reported. Compliance: No problems identified. Effectiveness: Clinically acceptable.  Newman Pies, RN  01/11/2024 11:02 AM  Sign when Signing Visit Nursing Pain Medication Assessment:  Safety precautions to be maintained throughout the outpatient stay will include: orient to surroundings, keep bed in low position, maintain call bell within reach at all times, provide assistance with transfer out of bed and ambulation.  Medication Inspection Compliance: Pill count conducted under aseptic conditions, in front of the patient. Neither the pills nor the bottle was removed from the patient's sight at any time. Once count was completed pills were immediately returned to the patient in their original bottle.  Medication: Hydrocodone/APAP Pill/Patch Count:  38 of 60 pills remain Pill/Patch Appearance: Markings consistent with prescribed medication Bottle Appearance: Standard pharmacy container. Clearly labeled. Filled Date: 4 / 1 / 2025 Last Medication intake:  Yesterday    No results found for: "CBDTHCR" No results found for: "D8THCCBX" No results found for: "D9THCCBX"  UDS:  Summary  Date Value Ref Range Status  04/11/2023 Note  Final    Comment:    ==================================================================== [facility] (MW) ==================================================================== Test                             Result  Flag       Units  Drug Present and Declared for Prescription Verification   Hydrocodone                    109          EXPECTED   ng/mg  creat   Dihydrocodeine                 74           EXPECTED   ng/mg creat   Norhydrocodone                 432          EXPECTED   ng/mg creat    Sources of hydrocodone include scheduled prescription medications.    Dihydrocodeine and norhydrocodone are expected metabolites of    hydrocodone. Dihydrocodeine is also available as a scheduled    prescription medication.  ==================================================================== Test                      Result    Flag   Units      Ref Range   Creatinine              78               mg/dL      >=82 ==================================================================== Declared Medications:  The flagging and interpretation on this report are based on the  following declared medications.  Unexpected results may arise from  inaccuracies in the declared medications.   **Note: The testing scope of this panel includes these medications:   Hydrocodone (Norco)   **Note: The testing scope of this panel does not include the  following reported medications:   Acetaminophen (Norco)  Atorvastatin (Lipitor)  Calcium  Clopidogrel (Plavix)  Diphenhydramine (Benadryl)  Divaleproex (Depakote)  Famotidine (Pepcid)  Furosemide (Lasix)  Hydroxychloroquine (Plaquenil)  Levothyroxine (Synthroid)  Loratadine (Claritin)  Losartan (Cozaar)  Magnesium (Mag-Ox)  Metoprolol (Toprol)  Naloxone (Narcan)  Trazodone (Desyrel)  Vitamin B12  Vitamin D3 ==================================================================== For clinical consultation, please call [phone]. ====================================================================       ROS  Constitutional: Denies any fever or chills Gastrointestinal: No reported hemesis, hematochezia, vomiting, or acute GI distress Musculoskeletal: Denies any acute onset joint swelling, redness, loss of ROM, or weakness Neurological: No reported episodes of acute onset apraxia, aphasia,  dysarthria, agnosia, amnesia, paralysis, loss of coordination, or loss of consciousness  Medication Review  Calcium, HYDROcodone-acetaminophen, Vitamin D-3, aspirin EC, atorvastatin, clopidogrel, cyanocobalamin, diphenhydrAMINE, divalproex, famotidine, furosemide, hydroxychloroquine, levothyroxine, loratadine, losartan, magnesium oxide, metoprolol succinate, naloxone, pantoprazole, polyethylene glycol, and traZODone  History Review  Allergy: Ms. Langland has no known allergies. Drug: Ms. Loux  reports current drug use. Drug: Hydrocodone. Alcohol:  reports that she does not currently use alcohol. Tobacco:  reports that she quit smoking about 3 years ago. Her smoking use included cigarettes. She started smoking about 33 years ago. She has never used smokeless tobacco. Social: Ms. Springsteen  reports that she quit smoking about 3 years ago. Her smoking use included cigarettes. She started smoking about 33 years ago. She has never used smokeless tobacco. She reports that she does not currently use alcohol. She reports current drug use. Drug: Hydrocodone. 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 Surgical: Ms. Vanosdol  has a past surgical history that includes Cardiac catheterization; Coronary angioplasty; Coronary artery bypass graft; back injections; Cataract extraction; LEFT ATRIAL APPENDAGE OCCLUSION (N/A, 12/28/2022); and TEE without cardioversion (N/A, 12/28/2022). Family: family history includes Bipolar disorder in her daughter; Breast cancer in her mother; Heart attack in her  father.  Laboratory Chemistry Profile   Renal Lab Results  Component Value Date   BUN 18 12/18/2023   CREATININE 0.90 12/18/2023   BCR 16 02/02/2023   GFR 67.85 10/18/2021   GFRAA 73 11/22/2020   GFRNONAA >60 12/03/2023    Hepatic Lab Results  Component Value Date   AST 30 10/14/2022   ALT 26 10/14/2022   ALBUMIN 3.8 10/14/2022   ALKPHOS 39 10/14/2022   AMMONIA 19 10/06/2020    Electrolytes Lab Results  Component Value Date   NA 136 12/18/2023   K 4.3 12/18/2023   CL 102 12/18/2023   CALCIUM 8.9 12/03/2023   MG 1.6 06/22/2022    Bone Lab Results  Component Value Date   25OHVITD1 12 (L) 03/14/2021   25OHVITD2 <1.0 03/14/2021   25OHVITD3 12 03/14/2021    Inflammation (CRP: Acute Phase) (ESR: Chronic Phase) Lab Results  Component Value Date   CRP 0.8 03/14/2021   ESRSEDRATE 3 08/01/2022   LATICACIDVEN 1.2 12/18/2023         Note: Above Lab results reviewed.  Recent Imaging Review  CT CERVICAL SPINE WO CONTRAST CLINICAL DATA:  Fall with trauma to the head and face  EXAM: CT HEAD WITHOUT CONTRAST  CT MAXILLOFACIAL WITHOUT CONTRAST  CT CERVICAL SPINE WITHOUT CONTRAST  TECHNIQUE: Multidetector CT imaging of the head, cervical spine, and maxillofacial structures were performed using the standard protocol without intravenous contrast. Multiplanar CT image reconstructions of the cervical spine and maxillofacial structures were also generated.  RADIATION DOSE REDUCTION: This exam was performed according to the departmental dose-optimization program which includes automated exposure control, adjustment of the mA and/or kV according to patient size and/or use of iterative reconstruction technique.  COMPARISON:  12/01/2023  FINDINGS: CT HEAD FINDINGS  Brain: Old infarction in the left cerebellum. Cerebral hemispheres show chronic small-vessel ischemic changes throughout the white matter and an old right frontal cortical and subcortical infarction. No  acute stroke, mass, hemorrhage, hydrocephalus or extra-axial collection.  Vascular: There is atherosclerotic calcification of the major vessels at the base of the brain.  Skull: No skull fracture.  Other: Left forehead region soft tissue hematoma.  CT MAXILLOFACIAL FINDINGS  Osseous: No facial fracture.  Orbits: No soft tissue orbital injury.  Sinuses: Sinuses are clear.  Soft tissues: Left supraorbital and forehead soft tissue hematoma.  CT CERVICAL SPINE FINDINGS  Alignment: No traumatic malalignment.  Skull base and vertebrae: No fracture or focal lesion.  Soft tissues and spinal canal: No traumatic soft tissue finding.  Disc levels: Ordinary degenerative spondylosis from C3-4 through C6-7. No apparent compressive bony stenosis of the canal. Bony foraminal narrowing at C5-6 and C6-7.  Upper chest: Negative  Other: None  IMPRESSION: HEAD CT:  No acute intracranial finding. Old infarctions in the left cerebellum and right frontal lobe. Chronic small-vessel ischemic changes of the cerebral hemispheric white matter.  MAXILLOFACIAL CT:  No facial fracture. Left supraorbital and forehead soft tissue hematoma.  CERVICAL SPINE CT:  No acute or traumatic finding. Ordinary degenerative spondylosis from C3-4 through C6-7. Bony  foraminal narrowing at C5-6 and C6-7.  Electronically Signed   By: Paulina Fusi M.D.   On: 12/18/2023 18:25 CT MAXILLOFACIAL WO CONTRAST CLINICAL DATA:  Fall with trauma to the head and face  EXAM: CT HEAD WITHOUT CONTRAST  CT MAXILLOFACIAL WITHOUT CONTRAST  CT CERVICAL SPINE WITHOUT CONTRAST  TECHNIQUE: Multidetector CT imaging of the head, cervical spine, and maxillofacial structures were performed using the standard protocol without intravenous contrast. Multiplanar CT image reconstructions of the cervical spine and maxillofacial structures were also generated.  RADIATION DOSE REDUCTION: This exam was performed according to  the departmental dose-optimization program which includes automated exposure control, adjustment of the mA and/or kV according to patient size and/or use of iterative reconstruction technique.  COMPARISON:  12/01/2023  FINDINGS: CT HEAD FINDINGS  Brain: Old infarction in the left cerebellum. Cerebral hemispheres show chronic small-vessel ischemic changes throughout the white matter and an old right frontal cortical and subcortical infarction. No acute stroke, mass, hemorrhage, hydrocephalus or extra-axial collection.  Vascular: There is atherosclerotic calcification of the major vessels at the base of the brain.  Skull: No skull fracture.  Other: Left forehead region soft tissue hematoma.  CT MAXILLOFACIAL FINDINGS  Osseous: No facial fracture.  Orbits: No soft tissue orbital injury.  Sinuses: Sinuses are clear.  Soft tissues: Left supraorbital and forehead soft tissue hematoma.  CT CERVICAL SPINE FINDINGS  Alignment: No traumatic malalignment.  Skull base and vertebrae: No fracture or focal lesion.  Soft tissues and spinal canal: No traumatic soft tissue finding.  Disc levels: Ordinary degenerative spondylosis from C3-4 through C6-7. No apparent compressive bony stenosis of the canal. Bony foraminal narrowing at C5-6 and C6-7.  Upper chest: Negative  Other: None  IMPRESSION: HEAD CT:  No acute intracranial finding. Old infarctions in the left cerebellum and right frontal lobe. Chronic small-vessel ischemic changes of the cerebral hemispheric white matter.  MAXILLOFACIAL CT:  No facial fracture. Left supraorbital and forehead soft tissue hematoma.  CERVICAL SPINE CT:  No acute or traumatic finding. Ordinary degenerative spondylosis from C3-4 through C6-7. Bony foraminal narrowing at C5-6 and C6-7.  Electronically Signed   By: Paulina Fusi M.D.   On: 12/18/2023 18:25 CT HEAD WO CONTRAST CLINICAL DATA:  Fall with trauma to the head and  face  EXAM: CT HEAD WITHOUT CONTRAST  CT MAXILLOFACIAL WITHOUT CONTRAST  CT CERVICAL SPINE WITHOUT CONTRAST  TECHNIQUE: Multidetector CT imaging of the head, cervical spine, and maxillofacial structures were performed using the standard protocol without intravenous contrast. Multiplanar CT image reconstructions of the cervical spine and maxillofacial structures were also generated.  RADIATION DOSE REDUCTION: This exam was performed according to the departmental dose-optimization program which includes automated exposure control, adjustment of the mA and/or kV according to patient size and/or use of iterative reconstruction technique.  COMPARISON:  12/01/2023  FINDINGS: CT HEAD FINDINGS  Brain: Old infarction in the left cerebellum. Cerebral hemispheres show chronic small-vessel ischemic changes throughout the white matter and an old right frontal cortical and subcortical infarction. No acute stroke, mass, hemorrhage, hydrocephalus or extra-axial collection.  Vascular: There is atherosclerotic calcification of the major vessels at the base of the brain.  Skull: No skull fracture.  Other: Left forehead region soft tissue hematoma.  CT MAXILLOFACIAL FINDINGS  Osseous: No facial fracture.  Orbits: No soft tissue orbital injury.  Sinuses: Sinuses are clear.  Soft tissues: Left supraorbital and forehead soft tissue hematoma.  CT CERVICAL SPINE FINDINGS  Alignment: No traumatic malalignment.  Skull base and  vertebrae: No fracture or focal lesion.  Soft tissues and spinal canal: No traumatic soft tissue finding.  Disc levels: Ordinary degenerative spondylosis from C3-4 through C6-7. No apparent compressive bony stenosis of the canal. Bony foraminal narrowing at C5-6 and C6-7.  Upper chest: Negative  Other: None  IMPRESSION: HEAD CT:  No acute intracranial finding. Old infarctions in the left cerebellum and right frontal lobe. Chronic small-vessel  ischemic changes of the cerebral hemispheric white matter.  MAXILLOFACIAL CT:  No facial fracture. Left supraorbital and forehead soft tissue hematoma.  CERVICAL SPINE CT:  No acute or traumatic finding. Ordinary degenerative spondylosis from C3-4 through C6-7. Bony foraminal narrowing at C5-6 and C6-7.  Electronically Signed   By: Paulina Fusi M.D.   On: 12/18/2023 18:25 DG Pelvis Portable CLINICAL DATA:  Fall.  EXAM: PORTABLE PELVIS 1-2 VIEWS  COMPARISON:  None Available.  FINDINGS: There is no evidence of pelvic fracture or diastasis. No pelvic bone lesions are seen.  IMPRESSION: Negative.  Electronically Signed   By: Lupita Raider M.D.   On: 12/18/2023 18:18 DG Knee Right Port CLINICAL DATA:  Fall.  EXAM: PORTABLE RIGHT KNEE - 1-2 VIEW  COMPARISON:  August 18, 2022.  FINDINGS: No evidence of fracture, dislocation, or joint effusion. 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 Soft tissues are unremarkable.  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 No acute abnormality seen.  Electronically Signed   By: Lupita Raider M.D.   On: 12/18/2023 18:18 DG Forearm Left CLINICAL DATA:  Fall.  EXAM: LEFT FOREARM - 2 VIEW  COMPARISON:  None Available.  FINDINGS: There is no evidence of fracture or other focal bone lesions. Soft tissues are unremarkable.  IMPRESSION: Negative.  Electronically Signed   By: Lupita Raider M.D.   On: 12/18/2023 18:16 DG Chest Port 1 View CLINICAL DATA:  Fall.  EXAM: PORTABLE CHEST 1 VIEW  COMPARISON:  December 28, 2022.  FINDINGS: Mild cardiomegaly is noted. Sternotomy wires are noted. Both lungs are clear. The visualized skeletal structures are unremarkable.  IMPRESSION: No active disease.  Electronically Signed   By: Lupita Raider M.D.   On: 12/18/2023 18:15 Note: Reviewed        Physical Exam  General appearance: alert and Well  nourished, well developed, and well hydrated. In no apparent acute distress Mental status: Alert, oriented x 3 (person, place, & time)       Respiratory: No evidence of acute respiratory distress Eyes: PERLA Vitals: BP (!) 145/80   Pulse 72   Temp (!) 97.5 F (36.4 C)   Resp 16   Ht 5' (1.524 m)   Wt 168 lb (76.2 kg)   SpO2 100%   BMI 32.81 kg/m  BMI: Estimated body mass index is 32.81 kg/m as calculated from the following:   Height as of this encounter: 5' (1.524 m).   Weight as of this encounter: 168 lb (76.2 kg). Ideal: Ideal body weight: 45.5 kg (100 lb 4.9 oz) Adjusted ideal body weight: 57.8 kg (127 lb 6.2 oz)  Assessment   Diagnosis Status  1. Chronic low back pain (1ry area of Pain) (Bilateral) (L>R) w/o sciatica   2. Chronic lower extremity pain (3ry area of Pain) (Bilateral) (L>R)   3. Pharmacologic therapy   4. Chronic pain syndrome   5. Encounter for medication management   6. Chronic neck pain (2ry area of Pain) (Bilateral) (L>R)   7. Lumbar facet  joint syndrome (Bilateral)   8. Chronic use of opiate for therapeutic purpose   9. Encounter for chronic pain management    Controlled Controlled Controlled   Updated Problems: Problem  Pharmacologic Therapy    Plan of Care  Problem-specific:  Assessment and Plan We will continue on current regimen. Prescription drug monitoring (PDMP) consistent with prescribed medication  Ms. Lakeasha Althoff has a current medication list which includes the following long-term medication(s): atorvastatin, calcium, diphenhydramine, divalproex, famotidine, furosemide, levothyroxine, loratadine, losartan, metoprolol succinate, naloxone, pantoprazole, trazodone, [START ON 01/31/2024] hydrocodone-acetaminophen, [START ON 03/01/2024] hydrocodone-acetaminophen, and [START ON 03/31/2024] hydrocodone-acetaminophen.  Pharmacotherapy (Medications Ordered): Meds ordered this encounter  Medications   HYDROcodone-acetaminophen (NORCO/VICODIN)  5-325 MG tablet    Sig: Take 1 tablet by mouth 2 (two) times daily as needed for severe pain (pain score 7-10). Must last 30 days.    Dispense:  60 tablet    Refill:  0    DO NOT: delete (not duplicate); no partial-fill (will deny script to complete), no refill request (F/U required). DISPENSE: 1 day early if closed on fill date. WARN: No CNS-depressants within 8 hrs of med.   HYDROcodone-acetaminophen (NORCO/VICODIN) 5-325 MG tablet    Sig: Take 1 tablet by mouth 2 (two) times daily as needed for severe pain (pain score 7-10). Must last 30 days.    Dispense:  60 tablet    Refill:  0    DO NOT: delete (not duplicate); no partial-fill (will deny script to complete), no refill request (F/U required). DISPENSE: 1 day early if closed on fill date. WARN: No CNS-depressants within 8 hrs of med.   HYDROcodone-acetaminophen (NORCO/VICODIN) 5-325 MG tablet    Sig: Take 1 tablet by mouth 2 (two) times daily as needed for severe pain (pain score 7-10). Must last 30 days.    Dispense:  60 tablet    Refill:  0    DO NOT: delete (not duplicate); no partial-fill (will deny script to complete), no refill request (F/U required). DISPENSE: 1 day early if closed on fill date. WARN: No CNS-depressants within 8 hrs of med.   Orders:  No orders of the defined types were placed in this encounter.  Follow-up plan:   Return in about 3 months (around 04/11/2024) for (F2F), (MM), Randalyn Rhea NP.    Recent Visits Date Type Provider Dept  10/17/23 Office Visit Delano Metz, MD [facility]  Showing recent visits within past 90 days and meeting all other requirements Today's Visits Date Type Provider Dept  01/11/24 Office Visit Bettey Costa, NP [facility]  Showing today's visits and meeting all other requirements Future Appointments Date Type Provider Dept  04/07/24 Appointment Bettey Costa, NP [facility]  Showing future appointments within next 90 days and meeting all  other requirements  I discussed the assessment and treatment plan with the patient. The patient was provided an opportunity to ask questions and all were answered. The patient agreed with the plan and demonstrated an understanding of the instructions.  Patient advised to call back or seek an in-person evaluation if the symptoms or condition worsens.  Duration of encounter: 30 minutes.  Total time on encounter, as per AMA guidelines included both the face-to-face and non-face-to-face time personally spent by the physician and/or other qualified health care professional(s) on the day of the encounter (includes time in activities that require the physician or other qualified health care professional and does not include time in activities normally performed by clinical staff).  Physician's time may include the following activities when performed: Preparing to see the patient (e.g., pre-charting review of records, searching for previously ordered imaging, lab work, and nerve conduction tests) Review of prior analgesic pharmacotherapies. Reviewing PMP Interpreting ordered tests (e.g., lab work, imaging, nerve conduction tests) Performing post-procedure evaluations, including interpretation of diagnostic procedures Obtaining and/or reviewing separately obtained history Performing a medically appropriate examination and/or evaluation Counseling and educating the patient/family/caregiver Ordering medications, tests, or procedures Referring and communicating with other health care professionals (when not separately reported) Documenting clinical information in the electronic or other health record Independently interpreting results (not separately reported) and communicating results to the patient/ family/caregiver Care coordination (not separately reported)  Note by: Bettey Costa, NP (TTS and AI technology used. I apologize for any typographical errors that were not detected and corrected.) Date:  01/11/2024; Time: 11:41 AM

## 2024-01-11 NOTE — Progress Notes (Signed)
 Nursing Pain Medication Assessment:  Safety precautions to be maintained throughout the outpatient stay will include: orient to surroundings, keep bed in low position, maintain call bell within reach at all times, provide assistance with transfer out of bed and ambulation.  Medication Inspection Compliance: Pill count conducted under aseptic conditions, in front of the patient. Neither the pills nor the bottle was removed from the patient's sight at any time. Once count was completed pills were immediately returned to the patient in their original bottle.  Medication: Hydrocodone/APAP Pill/Patch Count:  38 of 60 pills remain Pill/Patch Appearance: Markings consistent with prescribed medication Bottle Appearance: Standard pharmacy container. Clearly labeled. Filled Date: 4 / 1 / 2025 Last Medication intake:  Yesterday

## 2024-01-15 DIAGNOSIS — Z604 Social exclusion and rejection: Secondary | ICD-10-CM | POA: Diagnosis not present

## 2024-01-15 DIAGNOSIS — G894 Chronic pain syndrome: Secondary | ICD-10-CM | POA: Diagnosis not present

## 2024-01-15 DIAGNOSIS — I739 Peripheral vascular disease, unspecified: Secondary | ICD-10-CM | POA: Diagnosis not present

## 2024-01-15 DIAGNOSIS — Z951 Presence of aortocoronary bypass graft: Secondary | ICD-10-CM | POA: Diagnosis not present

## 2024-01-15 DIAGNOSIS — I2510 Atherosclerotic heart disease of native coronary artery without angina pectoris: Secondary | ICD-10-CM | POA: Diagnosis not present

## 2024-01-15 DIAGNOSIS — I4891 Unspecified atrial fibrillation: Secondary | ICD-10-CM | POA: Diagnosis not present

## 2024-01-15 DIAGNOSIS — E66811 Obesity, class 1: Secondary | ICD-10-CM | POA: Diagnosis not present

## 2024-01-15 DIAGNOSIS — Z556 Problems related to health literacy: Secondary | ICD-10-CM | POA: Diagnosis not present

## 2024-01-15 DIAGNOSIS — M069 Rheumatoid arthritis, unspecified: Secondary | ICD-10-CM | POA: Diagnosis not present

## 2024-01-15 DIAGNOSIS — Z7902 Long term (current) use of antithrombotics/antiplatelets: Secondary | ICD-10-CM | POA: Diagnosis not present

## 2024-01-15 DIAGNOSIS — I69354 Hemiplegia and hemiparesis following cerebral infarction affecting left non-dominant side: Secondary | ICD-10-CM | POA: Diagnosis not present

## 2024-01-15 DIAGNOSIS — B9681 Helicobacter pylori [H. pylori] as the cause of diseases classified elsewhere: Secondary | ICD-10-CM | POA: Diagnosis not present

## 2024-01-15 DIAGNOSIS — Z6831 Body mass index (BMI) 31.0-31.9, adult: Secondary | ICD-10-CM | POA: Diagnosis not present

## 2024-01-15 DIAGNOSIS — I10 Essential (primary) hypertension: Secondary | ICD-10-CM | POA: Diagnosis not present

## 2024-01-15 DIAGNOSIS — E039 Hypothyroidism, unspecified: Secondary | ICD-10-CM | POA: Diagnosis not present

## 2024-01-15 DIAGNOSIS — K219 Gastro-esophageal reflux disease without esophagitis: Secondary | ICD-10-CM | POA: Diagnosis not present

## 2024-01-17 DIAGNOSIS — Z604 Social exclusion and rejection: Secondary | ICD-10-CM | POA: Diagnosis not present

## 2024-01-17 DIAGNOSIS — M069 Rheumatoid arthritis, unspecified: Secondary | ICD-10-CM | POA: Diagnosis not present

## 2024-01-17 DIAGNOSIS — B9681 Helicobacter pylori [H. pylori] as the cause of diseases classified elsewhere: Secondary | ICD-10-CM | POA: Diagnosis not present

## 2024-01-17 DIAGNOSIS — I69354 Hemiplegia and hemiparesis following cerebral infarction affecting left non-dominant side: Secondary | ICD-10-CM | POA: Diagnosis not present

## 2024-01-17 DIAGNOSIS — K219 Gastro-esophageal reflux disease without esophagitis: Secondary | ICD-10-CM | POA: Diagnosis not present

## 2024-01-17 DIAGNOSIS — E039 Hypothyroidism, unspecified: Secondary | ICD-10-CM | POA: Diagnosis not present

## 2024-01-17 DIAGNOSIS — I4891 Unspecified atrial fibrillation: Secondary | ICD-10-CM | POA: Diagnosis not present

## 2024-01-17 DIAGNOSIS — Z7902 Long term (current) use of antithrombotics/antiplatelets: Secondary | ICD-10-CM | POA: Diagnosis not present

## 2024-01-17 DIAGNOSIS — I2510 Atherosclerotic heart disease of native coronary artery without angina pectoris: Secondary | ICD-10-CM | POA: Diagnosis not present

## 2024-01-17 DIAGNOSIS — Z556 Problems related to health literacy: Secondary | ICD-10-CM | POA: Diagnosis not present

## 2024-01-17 DIAGNOSIS — Z6831 Body mass index (BMI) 31.0-31.9, adult: Secondary | ICD-10-CM | POA: Diagnosis not present

## 2024-01-17 DIAGNOSIS — G894 Chronic pain syndrome: Secondary | ICD-10-CM | POA: Diagnosis not present

## 2024-01-17 DIAGNOSIS — Z951 Presence of aortocoronary bypass graft: Secondary | ICD-10-CM | POA: Diagnosis not present

## 2024-01-17 DIAGNOSIS — I739 Peripheral vascular disease, unspecified: Secondary | ICD-10-CM | POA: Diagnosis not present

## 2024-01-17 DIAGNOSIS — I10 Essential (primary) hypertension: Secondary | ICD-10-CM | POA: Diagnosis not present

## 2024-01-17 DIAGNOSIS — E66811 Obesity, class 1: Secondary | ICD-10-CM | POA: Diagnosis not present

## 2024-01-18 DIAGNOSIS — I69354 Hemiplegia and hemiparesis following cerebral infarction affecting left non-dominant side: Secondary | ICD-10-CM | POA: Diagnosis not present

## 2024-01-18 DIAGNOSIS — E66811 Obesity, class 1: Secondary | ICD-10-CM | POA: Diagnosis not present

## 2024-01-18 DIAGNOSIS — M069 Rheumatoid arthritis, unspecified: Secondary | ICD-10-CM | POA: Diagnosis not present

## 2024-01-18 DIAGNOSIS — Z556 Problems related to health literacy: Secondary | ICD-10-CM | POA: Diagnosis not present

## 2024-01-18 DIAGNOSIS — E039 Hypothyroidism, unspecified: Secondary | ICD-10-CM | POA: Diagnosis not present

## 2024-01-18 DIAGNOSIS — Z951 Presence of aortocoronary bypass graft: Secondary | ICD-10-CM | POA: Diagnosis not present

## 2024-01-18 DIAGNOSIS — G894 Chronic pain syndrome: Secondary | ICD-10-CM | POA: Diagnosis not present

## 2024-01-18 DIAGNOSIS — I10 Essential (primary) hypertension: Secondary | ICD-10-CM | POA: Diagnosis not present

## 2024-01-18 DIAGNOSIS — I4891 Unspecified atrial fibrillation: Secondary | ICD-10-CM | POA: Diagnosis not present

## 2024-01-18 DIAGNOSIS — I739 Peripheral vascular disease, unspecified: Secondary | ICD-10-CM | POA: Diagnosis not present

## 2024-01-18 DIAGNOSIS — Z604 Social exclusion and rejection: Secondary | ICD-10-CM | POA: Diagnosis not present

## 2024-01-18 DIAGNOSIS — Z7902 Long term (current) use of antithrombotics/antiplatelets: Secondary | ICD-10-CM | POA: Diagnosis not present

## 2024-01-18 DIAGNOSIS — I2510 Atherosclerotic heart disease of native coronary artery without angina pectoris: Secondary | ICD-10-CM | POA: Diagnosis not present

## 2024-01-18 DIAGNOSIS — B9681 Helicobacter pylori [H. pylori] as the cause of diseases classified elsewhere: Secondary | ICD-10-CM | POA: Diagnosis not present

## 2024-01-18 DIAGNOSIS — Z6831 Body mass index (BMI) 31.0-31.9, adult: Secondary | ICD-10-CM | POA: Diagnosis not present

## 2024-01-18 DIAGNOSIS — K219 Gastro-esophageal reflux disease without esophagitis: Secondary | ICD-10-CM | POA: Diagnosis not present

## 2024-01-19 DIAGNOSIS — Z7902 Long term (current) use of antithrombotics/antiplatelets: Secondary | ICD-10-CM | POA: Diagnosis not present

## 2024-01-19 DIAGNOSIS — I2510 Atherosclerotic heart disease of native coronary artery without angina pectoris: Secondary | ICD-10-CM | POA: Diagnosis not present

## 2024-01-19 DIAGNOSIS — I4891 Unspecified atrial fibrillation: Secondary | ICD-10-CM | POA: Diagnosis not present

## 2024-01-19 DIAGNOSIS — E66811 Obesity, class 1: Secondary | ICD-10-CM | POA: Diagnosis not present

## 2024-01-19 DIAGNOSIS — K219 Gastro-esophageal reflux disease without esophagitis: Secondary | ICD-10-CM | POA: Diagnosis not present

## 2024-01-19 DIAGNOSIS — I10 Essential (primary) hypertension: Secondary | ICD-10-CM | POA: Diagnosis not present

## 2024-01-19 DIAGNOSIS — Z556 Problems related to health literacy: Secondary | ICD-10-CM | POA: Diagnosis not present

## 2024-01-19 DIAGNOSIS — Z951 Presence of aortocoronary bypass graft: Secondary | ICD-10-CM | POA: Diagnosis not present

## 2024-01-19 DIAGNOSIS — Z604 Social exclusion and rejection: Secondary | ICD-10-CM | POA: Diagnosis not present

## 2024-01-19 DIAGNOSIS — E039 Hypothyroidism, unspecified: Secondary | ICD-10-CM | POA: Diagnosis not present

## 2024-01-19 DIAGNOSIS — M069 Rheumatoid arthritis, unspecified: Secondary | ICD-10-CM | POA: Diagnosis not present

## 2024-01-19 DIAGNOSIS — Z6831 Body mass index (BMI) 31.0-31.9, adult: Secondary | ICD-10-CM | POA: Diagnosis not present

## 2024-01-19 DIAGNOSIS — I69354 Hemiplegia and hemiparesis following cerebral infarction affecting left non-dominant side: Secondary | ICD-10-CM | POA: Diagnosis not present

## 2024-01-19 DIAGNOSIS — I739 Peripheral vascular disease, unspecified: Secondary | ICD-10-CM | POA: Diagnosis not present

## 2024-01-19 DIAGNOSIS — B9681 Helicobacter pylori [H. pylori] as the cause of diseases classified elsewhere: Secondary | ICD-10-CM | POA: Diagnosis not present

## 2024-01-19 DIAGNOSIS — G894 Chronic pain syndrome: Secondary | ICD-10-CM | POA: Diagnosis not present

## 2024-01-22 DIAGNOSIS — I739 Peripheral vascular disease, unspecified: Secondary | ICD-10-CM | POA: Diagnosis not present

## 2024-01-22 DIAGNOSIS — E039 Hypothyroidism, unspecified: Secondary | ICD-10-CM | POA: Diagnosis not present

## 2024-01-22 DIAGNOSIS — I2510 Atherosclerotic heart disease of native coronary artery without angina pectoris: Secondary | ICD-10-CM | POA: Diagnosis not present

## 2024-01-22 DIAGNOSIS — Z7902 Long term (current) use of antithrombotics/antiplatelets: Secondary | ICD-10-CM | POA: Diagnosis not present

## 2024-01-22 DIAGNOSIS — B9681 Helicobacter pylori [H. pylori] as the cause of diseases classified elsewhere: Secondary | ICD-10-CM | POA: Diagnosis not present

## 2024-01-22 DIAGNOSIS — G894 Chronic pain syndrome: Secondary | ICD-10-CM | POA: Diagnosis not present

## 2024-01-22 DIAGNOSIS — I10 Essential (primary) hypertension: Secondary | ICD-10-CM | POA: Diagnosis not present

## 2024-01-22 DIAGNOSIS — K219 Gastro-esophageal reflux disease without esophagitis: Secondary | ICD-10-CM | POA: Diagnosis not present

## 2024-01-22 DIAGNOSIS — M069 Rheumatoid arthritis, unspecified: Secondary | ICD-10-CM | POA: Diagnosis not present

## 2024-01-22 DIAGNOSIS — Z556 Problems related to health literacy: Secondary | ICD-10-CM | POA: Diagnosis not present

## 2024-01-22 DIAGNOSIS — I69354 Hemiplegia and hemiparesis following cerebral infarction affecting left non-dominant side: Secondary | ICD-10-CM | POA: Diagnosis not present

## 2024-01-22 DIAGNOSIS — I4891 Unspecified atrial fibrillation: Secondary | ICD-10-CM | POA: Diagnosis not present

## 2024-01-22 DIAGNOSIS — Z951 Presence of aortocoronary bypass graft: Secondary | ICD-10-CM | POA: Diagnosis not present

## 2024-01-22 DIAGNOSIS — E66811 Obesity, class 1: Secondary | ICD-10-CM | POA: Diagnosis not present

## 2024-01-22 DIAGNOSIS — Z6831 Body mass index (BMI) 31.0-31.9, adult: Secondary | ICD-10-CM | POA: Diagnosis not present

## 2024-01-22 DIAGNOSIS — Z604 Social exclusion and rejection: Secondary | ICD-10-CM | POA: Diagnosis not present

## 2024-01-23 DIAGNOSIS — Z6831 Body mass index (BMI) 31.0-31.9, adult: Secondary | ICD-10-CM | POA: Diagnosis not present

## 2024-01-23 DIAGNOSIS — G894 Chronic pain syndrome: Secondary | ICD-10-CM | POA: Diagnosis not present

## 2024-01-23 DIAGNOSIS — E66811 Obesity, class 1: Secondary | ICD-10-CM | POA: Diagnosis not present

## 2024-01-23 DIAGNOSIS — M069 Rheumatoid arthritis, unspecified: Secondary | ICD-10-CM | POA: Diagnosis not present

## 2024-01-23 DIAGNOSIS — I739 Peripheral vascular disease, unspecified: Secondary | ICD-10-CM | POA: Diagnosis not present

## 2024-01-23 DIAGNOSIS — I4891 Unspecified atrial fibrillation: Secondary | ICD-10-CM | POA: Diagnosis not present

## 2024-01-23 DIAGNOSIS — Z556 Problems related to health literacy: Secondary | ICD-10-CM | POA: Diagnosis not present

## 2024-01-23 DIAGNOSIS — I10 Essential (primary) hypertension: Secondary | ICD-10-CM | POA: Diagnosis not present

## 2024-01-23 DIAGNOSIS — I2510 Atherosclerotic heart disease of native coronary artery without angina pectoris: Secondary | ICD-10-CM | POA: Diagnosis not present

## 2024-01-23 DIAGNOSIS — Z604 Social exclusion and rejection: Secondary | ICD-10-CM | POA: Diagnosis not present

## 2024-01-23 DIAGNOSIS — B9681 Helicobacter pylori [H. pylori] as the cause of diseases classified elsewhere: Secondary | ICD-10-CM | POA: Diagnosis not present

## 2024-01-23 DIAGNOSIS — I69354 Hemiplegia and hemiparesis following cerebral infarction affecting left non-dominant side: Secondary | ICD-10-CM | POA: Diagnosis not present

## 2024-01-23 DIAGNOSIS — Z951 Presence of aortocoronary bypass graft: Secondary | ICD-10-CM | POA: Diagnosis not present

## 2024-01-23 DIAGNOSIS — Z7902 Long term (current) use of antithrombotics/antiplatelets: Secondary | ICD-10-CM | POA: Diagnosis not present

## 2024-01-23 DIAGNOSIS — E039 Hypothyroidism, unspecified: Secondary | ICD-10-CM | POA: Diagnosis not present

## 2024-01-23 DIAGNOSIS — K219 Gastro-esophageal reflux disease without esophagitis: Secondary | ICD-10-CM | POA: Diagnosis not present

## 2024-01-24 DIAGNOSIS — Z6831 Body mass index (BMI) 31.0-31.9, adult: Secondary | ICD-10-CM | POA: Diagnosis not present

## 2024-01-24 DIAGNOSIS — I4891 Unspecified atrial fibrillation: Secondary | ICD-10-CM | POA: Diagnosis not present

## 2024-01-24 DIAGNOSIS — Z7902 Long term (current) use of antithrombotics/antiplatelets: Secondary | ICD-10-CM | POA: Diagnosis not present

## 2024-01-24 DIAGNOSIS — B9681 Helicobacter pylori [H. pylori] as the cause of diseases classified elsewhere: Secondary | ICD-10-CM | POA: Diagnosis not present

## 2024-01-24 DIAGNOSIS — Z604 Social exclusion and rejection: Secondary | ICD-10-CM | POA: Diagnosis not present

## 2024-01-24 DIAGNOSIS — G894 Chronic pain syndrome: Secondary | ICD-10-CM | POA: Diagnosis not present

## 2024-01-24 DIAGNOSIS — E039 Hypothyroidism, unspecified: Secondary | ICD-10-CM | POA: Diagnosis not present

## 2024-01-24 DIAGNOSIS — K219 Gastro-esophageal reflux disease without esophagitis: Secondary | ICD-10-CM | POA: Diagnosis not present

## 2024-01-24 DIAGNOSIS — E66811 Obesity, class 1: Secondary | ICD-10-CM | POA: Diagnosis not present

## 2024-01-24 DIAGNOSIS — Z951 Presence of aortocoronary bypass graft: Secondary | ICD-10-CM | POA: Diagnosis not present

## 2024-01-24 DIAGNOSIS — I69354 Hemiplegia and hemiparesis following cerebral infarction affecting left non-dominant side: Secondary | ICD-10-CM | POA: Diagnosis not present

## 2024-01-24 DIAGNOSIS — Z556 Problems related to health literacy: Secondary | ICD-10-CM | POA: Diagnosis not present

## 2024-01-24 DIAGNOSIS — I10 Essential (primary) hypertension: Secondary | ICD-10-CM | POA: Diagnosis not present

## 2024-01-24 DIAGNOSIS — I739 Peripheral vascular disease, unspecified: Secondary | ICD-10-CM | POA: Diagnosis not present

## 2024-01-24 DIAGNOSIS — M069 Rheumatoid arthritis, unspecified: Secondary | ICD-10-CM | POA: Diagnosis not present

## 2024-01-24 DIAGNOSIS — I2510 Atherosclerotic heart disease of native coronary artery without angina pectoris: Secondary | ICD-10-CM | POA: Diagnosis not present

## 2024-01-25 ENCOUNTER — Telehealth: Payer: Self-pay

## 2024-01-25 NOTE — Telephone Encounter (Signed)
 Copied from CRM [id number]. Topic: Clinical - Home Health Verbal Orders >> Jan 25, 2024 12:39 PM Jethro Morrison wrote: Caller/Agency: [facility] Callback Number: [phone] OPT 2 Service Requested: Occupational Therapy Frequency: 1 X WEEK FOR 4 WEEKS Any new concerns about the patient? No

## 2024-01-28 DIAGNOSIS — Z556 Problems related to health literacy: Secondary | ICD-10-CM | POA: Diagnosis not present

## 2024-01-28 DIAGNOSIS — Z951 Presence of aortocoronary bypass graft: Secondary | ICD-10-CM | POA: Diagnosis not present

## 2024-01-28 DIAGNOSIS — E039 Hypothyroidism, unspecified: Secondary | ICD-10-CM | POA: Diagnosis not present

## 2024-01-28 DIAGNOSIS — K219 Gastro-esophageal reflux disease without esophagitis: Secondary | ICD-10-CM | POA: Diagnosis not present

## 2024-01-28 DIAGNOSIS — I2510 Atherosclerotic heart disease of native coronary artery without angina pectoris: Secondary | ICD-10-CM | POA: Diagnosis not present

## 2024-01-28 DIAGNOSIS — I4891 Unspecified atrial fibrillation: Secondary | ICD-10-CM | POA: Diagnosis not present

## 2024-01-28 DIAGNOSIS — Z604 Social exclusion and rejection: Secondary | ICD-10-CM | POA: Diagnosis not present

## 2024-01-28 DIAGNOSIS — M069 Rheumatoid arthritis, unspecified: Secondary | ICD-10-CM | POA: Diagnosis not present

## 2024-01-28 DIAGNOSIS — Z6831 Body mass index (BMI) 31.0-31.9, adult: Secondary | ICD-10-CM | POA: Diagnosis not present

## 2024-01-28 DIAGNOSIS — E66811 Obesity, class 1: Secondary | ICD-10-CM | POA: Diagnosis not present

## 2024-01-28 DIAGNOSIS — B9681 Helicobacter pylori [H. pylori] as the cause of diseases classified elsewhere: Secondary | ICD-10-CM | POA: Diagnosis not present

## 2024-01-28 DIAGNOSIS — I10 Essential (primary) hypertension: Secondary | ICD-10-CM | POA: Diagnosis not present

## 2024-01-28 DIAGNOSIS — Z7902 Long term (current) use of antithrombotics/antiplatelets: Secondary | ICD-10-CM | POA: Diagnosis not present

## 2024-01-28 DIAGNOSIS — I69354 Hemiplegia and hemiparesis following cerebral infarction affecting left non-dominant side: Secondary | ICD-10-CM | POA: Diagnosis not present

## 2024-01-28 DIAGNOSIS — I739 Peripheral vascular disease, unspecified: Secondary | ICD-10-CM | POA: Diagnosis not present

## 2024-01-28 DIAGNOSIS — G894 Chronic pain syndrome: Secondary | ICD-10-CM | POA: Diagnosis not present

## 2024-01-28 NOTE — Telephone Encounter (Signed)
 Verbal orders have been given to [name].

## 2024-01-29 DIAGNOSIS — E66811 Obesity, class 1: Secondary | ICD-10-CM | POA: Diagnosis not present

## 2024-01-29 DIAGNOSIS — I739 Peripheral vascular disease, unspecified: Secondary | ICD-10-CM | POA: Diagnosis not present

## 2024-01-29 DIAGNOSIS — M069 Rheumatoid arthritis, unspecified: Secondary | ICD-10-CM | POA: Diagnosis not present

## 2024-01-29 DIAGNOSIS — I4891 Unspecified atrial fibrillation: Secondary | ICD-10-CM | POA: Diagnosis not present

## 2024-01-29 DIAGNOSIS — Z951 Presence of aortocoronary bypass graft: Secondary | ICD-10-CM | POA: Diagnosis not present

## 2024-01-29 DIAGNOSIS — I2510 Atherosclerotic heart disease of native coronary artery without angina pectoris: Secondary | ICD-10-CM | POA: Diagnosis not present

## 2024-01-29 DIAGNOSIS — E039 Hypothyroidism, unspecified: Secondary | ICD-10-CM | POA: Diagnosis not present

## 2024-01-29 DIAGNOSIS — K219 Gastro-esophageal reflux disease without esophagitis: Secondary | ICD-10-CM | POA: Diagnosis not present

## 2024-01-29 DIAGNOSIS — G894 Chronic pain syndrome: Secondary | ICD-10-CM | POA: Diagnosis not present

## 2024-01-29 DIAGNOSIS — B9681 Helicobacter pylori [H. pylori] as the cause of diseases classified elsewhere: Secondary | ICD-10-CM | POA: Diagnosis not present

## 2024-01-29 DIAGNOSIS — Z556 Problems related to health literacy: Secondary | ICD-10-CM | POA: Diagnosis not present

## 2024-01-29 DIAGNOSIS — I10 Essential (primary) hypertension: Secondary | ICD-10-CM | POA: Diagnosis not present

## 2024-01-29 DIAGNOSIS — Z6831 Body mass index (BMI) 31.0-31.9, adult: Secondary | ICD-10-CM | POA: Diagnosis not present

## 2024-01-29 DIAGNOSIS — I69354 Hemiplegia and hemiparesis following cerebral infarction affecting left non-dominant side: Secondary | ICD-10-CM | POA: Diagnosis not present

## 2024-01-29 DIAGNOSIS — Z604 Social exclusion and rejection: Secondary | ICD-10-CM | POA: Diagnosis not present

## 2024-01-29 DIAGNOSIS — Z7902 Long term (current) use of antithrombotics/antiplatelets: Secondary | ICD-10-CM | POA: Diagnosis not present

## 2024-01-30 DIAGNOSIS — B9681 Helicobacter pylori [H. pylori] as the cause of diseases classified elsewhere: Secondary | ICD-10-CM | POA: Diagnosis not present

## 2024-01-30 DIAGNOSIS — E66811 Obesity, class 1: Secondary | ICD-10-CM | POA: Diagnosis not present

## 2024-01-30 DIAGNOSIS — G894 Chronic pain syndrome: Secondary | ICD-10-CM | POA: Diagnosis not present

## 2024-01-30 DIAGNOSIS — I739 Peripheral vascular disease, unspecified: Secondary | ICD-10-CM | POA: Diagnosis not present

## 2024-01-30 DIAGNOSIS — Z556 Problems related to health literacy: Secondary | ICD-10-CM | POA: Diagnosis not present

## 2024-01-30 DIAGNOSIS — I2510 Atherosclerotic heart disease of native coronary artery without angina pectoris: Secondary | ICD-10-CM | POA: Diagnosis not present

## 2024-01-30 DIAGNOSIS — I4891 Unspecified atrial fibrillation: Secondary | ICD-10-CM | POA: Diagnosis not present

## 2024-01-30 DIAGNOSIS — I69354 Hemiplegia and hemiparesis following cerebral infarction affecting left non-dominant side: Secondary | ICD-10-CM | POA: Diagnosis not present

## 2024-01-30 DIAGNOSIS — Z951 Presence of aortocoronary bypass graft: Secondary | ICD-10-CM | POA: Diagnosis not present

## 2024-01-30 DIAGNOSIS — Z6831 Body mass index (BMI) 31.0-31.9, adult: Secondary | ICD-10-CM | POA: Diagnosis not present

## 2024-01-30 DIAGNOSIS — I10 Essential (primary) hypertension: Secondary | ICD-10-CM | POA: Diagnosis not present

## 2024-01-30 DIAGNOSIS — M069 Rheumatoid arthritis, unspecified: Secondary | ICD-10-CM | POA: Diagnosis not present

## 2024-01-30 DIAGNOSIS — K219 Gastro-esophageal reflux disease without esophagitis: Secondary | ICD-10-CM | POA: Diagnosis not present

## 2024-01-30 DIAGNOSIS — E039 Hypothyroidism, unspecified: Secondary | ICD-10-CM | POA: Diagnosis not present

## 2024-01-30 DIAGNOSIS — Z604 Social exclusion and rejection: Secondary | ICD-10-CM | POA: Diagnosis not present

## 2024-01-30 DIAGNOSIS — Z7902 Long term (current) use of antithrombotics/antiplatelets: Secondary | ICD-10-CM | POA: Diagnosis not present

## 2024-01-31 DIAGNOSIS — I2510 Atherosclerotic heart disease of native coronary artery without angina pectoris: Secondary | ICD-10-CM | POA: Diagnosis not present

## 2024-01-31 DIAGNOSIS — I739 Peripheral vascular disease, unspecified: Secondary | ICD-10-CM | POA: Diagnosis not present

## 2024-01-31 DIAGNOSIS — E66811 Obesity, class 1: Secondary | ICD-10-CM | POA: Diagnosis not present

## 2024-01-31 DIAGNOSIS — I4891 Unspecified atrial fibrillation: Secondary | ICD-10-CM | POA: Diagnosis not present

## 2024-01-31 DIAGNOSIS — Z604 Social exclusion and rejection: Secondary | ICD-10-CM | POA: Diagnosis not present

## 2024-01-31 DIAGNOSIS — Z556 Problems related to health literacy: Secondary | ICD-10-CM | POA: Diagnosis not present

## 2024-01-31 DIAGNOSIS — I69354 Hemiplegia and hemiparesis following cerebral infarction affecting left non-dominant side: Secondary | ICD-10-CM | POA: Diagnosis not present

## 2024-01-31 DIAGNOSIS — I10 Essential (primary) hypertension: Secondary | ICD-10-CM | POA: Diagnosis not present

## 2024-01-31 DIAGNOSIS — Z9181 History of falling: Secondary | ICD-10-CM | POA: Diagnosis not present

## 2024-01-31 DIAGNOSIS — Z7902 Long term (current) use of antithrombotics/antiplatelets: Secondary | ICD-10-CM | POA: Diagnosis not present

## 2024-01-31 DIAGNOSIS — Z6831 Body mass index (BMI) 31.0-31.9, adult: Secondary | ICD-10-CM | POA: Diagnosis not present

## 2024-01-31 DIAGNOSIS — K219 Gastro-esophageal reflux disease without esophagitis: Secondary | ICD-10-CM | POA: Diagnosis not present

## 2024-01-31 DIAGNOSIS — E039 Hypothyroidism, unspecified: Secondary | ICD-10-CM | POA: Diagnosis not present

## 2024-01-31 DIAGNOSIS — M069 Rheumatoid arthritis, unspecified: Secondary | ICD-10-CM | POA: Diagnosis not present

## 2024-01-31 DIAGNOSIS — B9681 Helicobacter pylori [H. pylori] as the cause of diseases classified elsewhere: Secondary | ICD-10-CM | POA: Diagnosis not present

## 2024-01-31 DIAGNOSIS — Z951 Presence of aortocoronary bypass graft: Secondary | ICD-10-CM | POA: Diagnosis not present

## 2024-01-31 DIAGNOSIS — G894 Chronic pain syndrome: Secondary | ICD-10-CM | POA: Diagnosis not present

## 2024-02-01 DIAGNOSIS — Z7902 Long term (current) use of antithrombotics/antiplatelets: Secondary | ICD-10-CM | POA: Diagnosis not present

## 2024-02-01 DIAGNOSIS — I69354 Hemiplegia and hemiparesis following cerebral infarction affecting left non-dominant side: Secondary | ICD-10-CM | POA: Diagnosis not present

## 2024-02-01 DIAGNOSIS — M069 Rheumatoid arthritis, unspecified: Secondary | ICD-10-CM | POA: Diagnosis not present

## 2024-02-01 DIAGNOSIS — I10 Essential (primary) hypertension: Secondary | ICD-10-CM | POA: Diagnosis not present

## 2024-02-01 DIAGNOSIS — G894 Chronic pain syndrome: Secondary | ICD-10-CM | POA: Diagnosis not present

## 2024-02-01 DIAGNOSIS — I4891 Unspecified atrial fibrillation: Secondary | ICD-10-CM | POA: Diagnosis not present

## 2024-02-01 DIAGNOSIS — Z6831 Body mass index (BMI) 31.0-31.9, adult: Secondary | ICD-10-CM | POA: Diagnosis not present

## 2024-02-01 DIAGNOSIS — B9681 Helicobacter pylori [H. pylori] as the cause of diseases classified elsewhere: Secondary | ICD-10-CM | POA: Diagnosis not present

## 2024-02-01 DIAGNOSIS — K219 Gastro-esophageal reflux disease without esophagitis: Secondary | ICD-10-CM | POA: Diagnosis not present

## 2024-02-01 DIAGNOSIS — Z604 Social exclusion and rejection: Secondary | ICD-10-CM | POA: Diagnosis not present

## 2024-02-01 DIAGNOSIS — I739 Peripheral vascular disease, unspecified: Secondary | ICD-10-CM | POA: Diagnosis not present

## 2024-02-01 DIAGNOSIS — I2510 Atherosclerotic heart disease of native coronary artery without angina pectoris: Secondary | ICD-10-CM | POA: Diagnosis not present

## 2024-02-01 DIAGNOSIS — Z951 Presence of aortocoronary bypass graft: Secondary | ICD-10-CM | POA: Diagnosis not present

## 2024-02-01 DIAGNOSIS — E039 Hypothyroidism, unspecified: Secondary | ICD-10-CM | POA: Diagnosis not present

## 2024-02-01 DIAGNOSIS — E66811 Obesity, class 1: Secondary | ICD-10-CM | POA: Diagnosis not present

## 2024-02-01 DIAGNOSIS — Z9181 History of falling: Secondary | ICD-10-CM | POA: Diagnosis not present

## 2024-02-01 DIAGNOSIS — Z556 Problems related to health literacy: Secondary | ICD-10-CM | POA: Diagnosis not present

## 2024-02-04 DIAGNOSIS — Z6831 Body mass index (BMI) 31.0-31.9, adult: Secondary | ICD-10-CM | POA: Diagnosis not present

## 2024-02-04 DIAGNOSIS — K219 Gastro-esophageal reflux disease without esophagitis: Secondary | ICD-10-CM | POA: Diagnosis not present

## 2024-02-04 DIAGNOSIS — G894 Chronic pain syndrome: Secondary | ICD-10-CM | POA: Diagnosis not present

## 2024-02-04 DIAGNOSIS — Z951 Presence of aortocoronary bypass graft: Secondary | ICD-10-CM | POA: Diagnosis not present

## 2024-02-04 DIAGNOSIS — Z7902 Long term (current) use of antithrombotics/antiplatelets: Secondary | ICD-10-CM | POA: Diagnosis not present

## 2024-02-04 DIAGNOSIS — Z9181 History of falling: Secondary | ICD-10-CM | POA: Diagnosis not present

## 2024-02-04 DIAGNOSIS — I739 Peripheral vascular disease, unspecified: Secondary | ICD-10-CM | POA: Diagnosis not present

## 2024-02-04 DIAGNOSIS — Z604 Social exclusion and rejection: Secondary | ICD-10-CM | POA: Diagnosis not present

## 2024-02-04 DIAGNOSIS — E66811 Obesity, class 1: Secondary | ICD-10-CM | POA: Diagnosis not present

## 2024-02-04 DIAGNOSIS — I4891 Unspecified atrial fibrillation: Secondary | ICD-10-CM | POA: Diagnosis not present

## 2024-02-04 DIAGNOSIS — E039 Hypothyroidism, unspecified: Secondary | ICD-10-CM | POA: Diagnosis not present

## 2024-02-04 DIAGNOSIS — B9681 Helicobacter pylori [H. pylori] as the cause of diseases classified elsewhere: Secondary | ICD-10-CM | POA: Diagnosis not present

## 2024-02-04 DIAGNOSIS — Z556 Problems related to health literacy: Secondary | ICD-10-CM | POA: Diagnosis not present

## 2024-02-04 DIAGNOSIS — I10 Essential (primary) hypertension: Secondary | ICD-10-CM | POA: Diagnosis not present

## 2024-02-04 DIAGNOSIS — M069 Rheumatoid arthritis, unspecified: Secondary | ICD-10-CM | POA: Diagnosis not present

## 2024-02-04 DIAGNOSIS — I2510 Atherosclerotic heart disease of native coronary artery without angina pectoris: Secondary | ICD-10-CM | POA: Diagnosis not present

## 2024-02-04 DIAGNOSIS — I69354 Hemiplegia and hemiparesis following cerebral infarction affecting left non-dominant side: Secondary | ICD-10-CM | POA: Diagnosis not present

## 2024-02-06 DIAGNOSIS — I739 Peripheral vascular disease, unspecified: Secondary | ICD-10-CM | POA: Diagnosis not present

## 2024-02-06 DIAGNOSIS — K219 Gastro-esophageal reflux disease without esophagitis: Secondary | ICD-10-CM | POA: Diagnosis not present

## 2024-02-06 DIAGNOSIS — B9681 Helicobacter pylori [H. pylori] as the cause of diseases classified elsewhere: Secondary | ICD-10-CM | POA: Diagnosis not present

## 2024-02-06 DIAGNOSIS — Z9181 History of falling: Secondary | ICD-10-CM | POA: Diagnosis not present

## 2024-02-06 DIAGNOSIS — I10 Essential (primary) hypertension: Secondary | ICD-10-CM | POA: Diagnosis not present

## 2024-02-06 DIAGNOSIS — E039 Hypothyroidism, unspecified: Secondary | ICD-10-CM | POA: Diagnosis not present

## 2024-02-06 DIAGNOSIS — I4891 Unspecified atrial fibrillation: Secondary | ICD-10-CM | POA: Diagnosis not present

## 2024-02-06 DIAGNOSIS — Z6831 Body mass index (BMI) 31.0-31.9, adult: Secondary | ICD-10-CM | POA: Diagnosis not present

## 2024-02-06 DIAGNOSIS — Z951 Presence of aortocoronary bypass graft: Secondary | ICD-10-CM | POA: Diagnosis not present

## 2024-02-06 DIAGNOSIS — I2510 Atherosclerotic heart disease of native coronary artery without angina pectoris: Secondary | ICD-10-CM | POA: Diagnosis not present

## 2024-02-06 DIAGNOSIS — Z604 Social exclusion and rejection: Secondary | ICD-10-CM | POA: Diagnosis not present

## 2024-02-06 DIAGNOSIS — I69354 Hemiplegia and hemiparesis following cerebral infarction affecting left non-dominant side: Secondary | ICD-10-CM | POA: Diagnosis not present

## 2024-02-06 DIAGNOSIS — M069 Rheumatoid arthritis, unspecified: Secondary | ICD-10-CM | POA: Diagnosis not present

## 2024-02-06 DIAGNOSIS — E66811 Obesity, class 1: Secondary | ICD-10-CM | POA: Diagnosis not present

## 2024-02-06 DIAGNOSIS — Z7902 Long term (current) use of antithrombotics/antiplatelets: Secondary | ICD-10-CM | POA: Diagnosis not present

## 2024-02-06 DIAGNOSIS — G894 Chronic pain syndrome: Secondary | ICD-10-CM | POA: Diagnosis not present

## 2024-02-06 DIAGNOSIS — Z556 Problems related to health literacy: Secondary | ICD-10-CM | POA: Diagnosis not present

## 2024-02-11 DIAGNOSIS — I10 Essential (primary) hypertension: Secondary | ICD-10-CM | POA: Diagnosis not present

## 2024-02-11 DIAGNOSIS — R413 Other amnesia: Secondary | ICD-10-CM | POA: Diagnosis not present

## 2024-02-11 DIAGNOSIS — R296 Repeated falls: Secondary | ICD-10-CM | POA: Diagnosis not present

## 2024-02-11 DIAGNOSIS — F32A Depression, unspecified: Secondary | ICD-10-CM | POA: Diagnosis not present

## 2024-02-11 DIAGNOSIS — R569 Unspecified convulsions: Secondary | ICD-10-CM | POA: Diagnosis not present

## 2024-02-12 DIAGNOSIS — Z7902 Long term (current) use of antithrombotics/antiplatelets: Secondary | ICD-10-CM | POA: Diagnosis not present

## 2024-02-12 DIAGNOSIS — E039 Hypothyroidism, unspecified: Secondary | ICD-10-CM | POA: Diagnosis not present

## 2024-02-12 DIAGNOSIS — I739 Peripheral vascular disease, unspecified: Secondary | ICD-10-CM | POA: Diagnosis not present

## 2024-02-12 DIAGNOSIS — I2510 Atherosclerotic heart disease of native coronary artery without angina pectoris: Secondary | ICD-10-CM | POA: Diagnosis not present

## 2024-02-12 DIAGNOSIS — Z9181 History of falling: Secondary | ICD-10-CM | POA: Diagnosis not present

## 2024-02-12 DIAGNOSIS — Z6831 Body mass index (BMI) 31.0-31.9, adult: Secondary | ICD-10-CM | POA: Diagnosis not present

## 2024-02-12 DIAGNOSIS — Z604 Social exclusion and rejection: Secondary | ICD-10-CM | POA: Diagnosis not present

## 2024-02-12 DIAGNOSIS — I10 Essential (primary) hypertension: Secondary | ICD-10-CM | POA: Diagnosis not present

## 2024-02-12 DIAGNOSIS — E66811 Obesity, class 1: Secondary | ICD-10-CM | POA: Diagnosis not present

## 2024-02-12 DIAGNOSIS — K219 Gastro-esophageal reflux disease without esophagitis: Secondary | ICD-10-CM | POA: Diagnosis not present

## 2024-02-12 DIAGNOSIS — M069 Rheumatoid arthritis, unspecified: Secondary | ICD-10-CM | POA: Diagnosis not present

## 2024-02-12 DIAGNOSIS — I69354 Hemiplegia and hemiparesis following cerebral infarction affecting left non-dominant side: Secondary | ICD-10-CM | POA: Diagnosis not present

## 2024-02-12 DIAGNOSIS — B9681 Helicobacter pylori [H. pylori] as the cause of diseases classified elsewhere: Secondary | ICD-10-CM | POA: Diagnosis not present

## 2024-02-12 DIAGNOSIS — Z556 Problems related to health literacy: Secondary | ICD-10-CM | POA: Diagnosis not present

## 2024-02-12 DIAGNOSIS — G894 Chronic pain syndrome: Secondary | ICD-10-CM | POA: Diagnosis not present

## 2024-02-12 DIAGNOSIS — Z951 Presence of aortocoronary bypass graft: Secondary | ICD-10-CM | POA: Diagnosis not present

## 2024-02-12 DIAGNOSIS — I4891 Unspecified atrial fibrillation: Secondary | ICD-10-CM | POA: Diagnosis not present

## 2024-02-19 DIAGNOSIS — Z9181 History of falling: Secondary | ICD-10-CM | POA: Diagnosis not present

## 2024-02-19 DIAGNOSIS — I2510 Atherosclerotic heart disease of native coronary artery without angina pectoris: Secondary | ICD-10-CM | POA: Diagnosis not present

## 2024-02-19 DIAGNOSIS — Z7902 Long term (current) use of antithrombotics/antiplatelets: Secondary | ICD-10-CM | POA: Diagnosis not present

## 2024-02-19 DIAGNOSIS — E66811 Obesity, class 1: Secondary | ICD-10-CM | POA: Diagnosis not present

## 2024-02-19 DIAGNOSIS — Z951 Presence of aortocoronary bypass graft: Secondary | ICD-10-CM | POA: Diagnosis not present

## 2024-02-19 DIAGNOSIS — I739 Peripheral vascular disease, unspecified: Secondary | ICD-10-CM | POA: Diagnosis not present

## 2024-02-19 DIAGNOSIS — Z604 Social exclusion and rejection: Secondary | ICD-10-CM | POA: Diagnosis not present

## 2024-02-19 DIAGNOSIS — B9681 Helicobacter pylori [H. pylori] as the cause of diseases classified elsewhere: Secondary | ICD-10-CM | POA: Diagnosis not present

## 2024-02-19 DIAGNOSIS — M069 Rheumatoid arthritis, unspecified: Secondary | ICD-10-CM | POA: Diagnosis not present

## 2024-02-19 DIAGNOSIS — I10 Essential (primary) hypertension: Secondary | ICD-10-CM | POA: Diagnosis not present

## 2024-02-19 DIAGNOSIS — E039 Hypothyroidism, unspecified: Secondary | ICD-10-CM | POA: Diagnosis not present

## 2024-02-19 DIAGNOSIS — G894 Chronic pain syndrome: Secondary | ICD-10-CM | POA: Diagnosis not present

## 2024-02-19 DIAGNOSIS — I4891 Unspecified atrial fibrillation: Secondary | ICD-10-CM | POA: Diagnosis not present

## 2024-02-19 DIAGNOSIS — I69354 Hemiplegia and hemiparesis following cerebral infarction affecting left non-dominant side: Secondary | ICD-10-CM | POA: Diagnosis not present

## 2024-02-19 DIAGNOSIS — Z6831 Body mass index (BMI) 31.0-31.9, adult: Secondary | ICD-10-CM | POA: Diagnosis not present

## 2024-02-19 DIAGNOSIS — Z556 Problems related to health literacy: Secondary | ICD-10-CM | POA: Diagnosis not present

## 2024-02-19 DIAGNOSIS — K219 Gastro-esophageal reflux disease without esophagitis: Secondary | ICD-10-CM | POA: Diagnosis not present

## 2024-02-20 DIAGNOSIS — Z6831 Body mass index (BMI) 31.0-31.9, adult: Secondary | ICD-10-CM | POA: Diagnosis not present

## 2024-02-20 DIAGNOSIS — I739 Peripheral vascular disease, unspecified: Secondary | ICD-10-CM | POA: Diagnosis not present

## 2024-02-20 DIAGNOSIS — Z7902 Long term (current) use of antithrombotics/antiplatelets: Secondary | ICD-10-CM | POA: Diagnosis not present

## 2024-02-20 DIAGNOSIS — B9681 Helicobacter pylori [H. pylori] as the cause of diseases classified elsewhere: Secondary | ICD-10-CM | POA: Diagnosis not present

## 2024-02-20 DIAGNOSIS — G894 Chronic pain syndrome: Secondary | ICD-10-CM | POA: Diagnosis not present

## 2024-02-20 DIAGNOSIS — Z951 Presence of aortocoronary bypass graft: Secondary | ICD-10-CM | POA: Diagnosis not present

## 2024-02-20 DIAGNOSIS — Z556 Problems related to health literacy: Secondary | ICD-10-CM | POA: Diagnosis not present

## 2024-02-20 DIAGNOSIS — K219 Gastro-esophageal reflux disease without esophagitis: Secondary | ICD-10-CM | POA: Diagnosis not present

## 2024-02-20 DIAGNOSIS — E66811 Obesity, class 1: Secondary | ICD-10-CM | POA: Diagnosis not present

## 2024-02-20 DIAGNOSIS — M069 Rheumatoid arthritis, unspecified: Secondary | ICD-10-CM | POA: Diagnosis not present

## 2024-02-20 DIAGNOSIS — I10 Essential (primary) hypertension: Secondary | ICD-10-CM | POA: Diagnosis not present

## 2024-02-20 DIAGNOSIS — I2510 Atherosclerotic heart disease of native coronary artery without angina pectoris: Secondary | ICD-10-CM | POA: Diagnosis not present

## 2024-02-20 DIAGNOSIS — Z9181 History of falling: Secondary | ICD-10-CM | POA: Diagnosis not present

## 2024-02-20 DIAGNOSIS — Z604 Social exclusion and rejection: Secondary | ICD-10-CM | POA: Diagnosis not present

## 2024-02-20 DIAGNOSIS — E039 Hypothyroidism, unspecified: Secondary | ICD-10-CM | POA: Diagnosis not present

## 2024-02-20 DIAGNOSIS — I4891 Unspecified atrial fibrillation: Secondary | ICD-10-CM | POA: Diagnosis not present

## 2024-02-20 DIAGNOSIS — I69354 Hemiplegia and hemiparesis following cerebral infarction affecting left non-dominant side: Secondary | ICD-10-CM | POA: Diagnosis not present

## 2024-02-26 ENCOUNTER — Inpatient Hospital Stay: Admitting: Neurology

## 2024-02-26 ENCOUNTER — Encounter: Payer: Self-pay | Admitting: Neurology

## 2024-02-27 DIAGNOSIS — I2510 Atherosclerotic heart disease of native coronary artery without angina pectoris: Secondary | ICD-10-CM | POA: Diagnosis not present

## 2024-02-27 DIAGNOSIS — Z7902 Long term (current) use of antithrombotics/antiplatelets: Secondary | ICD-10-CM | POA: Diagnosis not present

## 2024-02-27 DIAGNOSIS — M069 Rheumatoid arthritis, unspecified: Secondary | ICD-10-CM | POA: Diagnosis not present

## 2024-02-27 DIAGNOSIS — G894 Chronic pain syndrome: Secondary | ICD-10-CM | POA: Diagnosis not present

## 2024-02-27 DIAGNOSIS — Z9181 History of falling: Secondary | ICD-10-CM | POA: Diagnosis not present

## 2024-02-27 DIAGNOSIS — Z951 Presence of aortocoronary bypass graft: Secondary | ICD-10-CM | POA: Diagnosis not present

## 2024-02-27 DIAGNOSIS — I10 Essential (primary) hypertension: Secondary | ICD-10-CM | POA: Diagnosis not present

## 2024-02-27 DIAGNOSIS — Z6831 Body mass index (BMI) 31.0-31.9, adult: Secondary | ICD-10-CM | POA: Diagnosis not present

## 2024-02-27 DIAGNOSIS — K219 Gastro-esophageal reflux disease without esophagitis: Secondary | ICD-10-CM | POA: Diagnosis not present

## 2024-02-27 DIAGNOSIS — I4891 Unspecified atrial fibrillation: Secondary | ICD-10-CM | POA: Diagnosis not present

## 2024-02-27 DIAGNOSIS — Z604 Social exclusion and rejection: Secondary | ICD-10-CM | POA: Diagnosis not present

## 2024-02-27 DIAGNOSIS — I739 Peripheral vascular disease, unspecified: Secondary | ICD-10-CM | POA: Diagnosis not present

## 2024-02-27 DIAGNOSIS — I69354 Hemiplegia and hemiparesis following cerebral infarction affecting left non-dominant side: Secondary | ICD-10-CM | POA: Diagnosis not present

## 2024-02-27 DIAGNOSIS — E039 Hypothyroidism, unspecified: Secondary | ICD-10-CM | POA: Diagnosis not present

## 2024-02-27 DIAGNOSIS — Z556 Problems related to health literacy: Secondary | ICD-10-CM | POA: Diagnosis not present

## 2024-02-27 DIAGNOSIS — E66811 Obesity, class 1: Secondary | ICD-10-CM | POA: Diagnosis not present

## 2024-02-27 DIAGNOSIS — B9681 Helicobacter pylori [H. pylori] as the cause of diseases classified elsewhere: Secondary | ICD-10-CM | POA: Diagnosis not present

## 2024-03-08 ENCOUNTER — Other Ambulatory Visit: Payer: Self-pay | Admitting: Family

## 2024-03-08 DIAGNOSIS — J301 Allergic rhinitis due to pollen: Secondary | ICD-10-CM

## 2024-03-08 DIAGNOSIS — I10 Essential (primary) hypertension: Secondary | ICD-10-CM

## 2024-03-27 ENCOUNTER — Other Ambulatory Visit: Payer: Self-pay | Admitting: Family

## 2024-03-27 DIAGNOSIS — G4700 Insomnia, unspecified: Secondary | ICD-10-CM

## 2024-03-28 ENCOUNTER — Ambulatory Visit: Payer: Medicare Other | Attending: Internal Medicine | Admitting: Internal Medicine

## 2024-03-28 ENCOUNTER — Ambulatory Visit (INDEPENDENT_AMBULATORY_CARE_PROVIDER_SITE_OTHER)

## 2024-03-28 VITALS — BP 142/90 | HR 54 | Ht 60.0 in | Wt 170.2 lb

## 2024-03-28 DIAGNOSIS — Z79899 Other long term (current) drug therapy: Secondary | ICD-10-CM | POA: Diagnosis not present

## 2024-03-28 DIAGNOSIS — E785 Hyperlipidemia, unspecified: Secondary | ICD-10-CM

## 2024-03-28 DIAGNOSIS — I639 Cerebral infarction, unspecified: Secondary | ICD-10-CM

## 2024-03-28 DIAGNOSIS — I4821 Permanent atrial fibrillation: Secondary | ICD-10-CM | POA: Diagnosis not present

## 2024-03-28 DIAGNOSIS — I25810 Atherosclerosis of coronary artery bypass graft(s) without angina pectoris: Secondary | ICD-10-CM

## 2024-03-28 DIAGNOSIS — I10 Essential (primary) hypertension: Secondary | ICD-10-CM

## 2024-03-28 DIAGNOSIS — I4819 Other persistent atrial fibrillation: Secondary | ICD-10-CM

## 2024-03-28 MED ORDER — METOPROLOL SUCCINATE ER 25 MG PO TB24: 25.0000 mg | ORAL_TABLET | Freq: Every day | ORAL | 3 refills | Status: DC

## 2024-03-28 NOTE — Progress Notes (Unsigned)
 Cardiology Office Note:  .   Date:  03/29/2024  ID:  Carmen [name], DOB 11-Dec-1940, MRN [id number] PCP: Carmen Antu, FNP  [facility] Providers Cardiologist:  Lonni Hanson, MD Electrophysiologist:  OLE ONEIDA HOLTS, MD     History of Present Illness: .   Carmen [name] is a 83 y.o. female with history of coronary artery disease status post CABG (2005, LIMA-LAD, sequential SVG-D-OM, and SVG-PDA) and subsequent PCI due to failure of multiple grafts (Taxus stents to proximal LAD, mid LCx, and RCA), persistent atrial fibrillation status post left atrial appendage occlusion, stroke, PAD, hypertension, hyperlipidemia, hypothyroidism, anemia, chronic pain syndrome, and depression/anxiety, who presents for follow-up of CAD and atrial fibrillation.  She was last seen in our office in 09/2023 by Carmen Louis, NP, at which time she was feeling fairly well though she was quite sedentary.  Her chronic DOE was actually a little bit better than in years past.  No medication changes or additional testing were pursued.  She was hospitalized in March due to stroke presenting as left-sided weakness with multiple falls.  Brain MRI showed small acute infarct near previous right ACA territory infarct.  She was discharged on 21 days of DAPT followed by long-term clopidogrel  therapy.  Echocardiogram during that admission showed normal LVEF with moderate LVH, mild pulmonary hypertension, and moderate MR.  Today, Carmen [name] reports that she has been feeling fairly well since returning home from rehab after her hospitalization in March.  She believes that she had two more mini-strokes while in rehab but has not had any focal neurologic changes or further falls since returning home.  She complains of increased dyspnea with exertion, though her daughter wonders if that may be due to deconditioning.  She denies dyspnea at rest as well as chest pain, palpitations, and lightheadedness.  She notes occasional dependent  lower extremity edema (L>R), which resolves with leg elevation.  BP readings have been stable, though [name] and her daughter do not recall specific readings.  She has not had any significant bleeding.  ROS: See HPI  Studies Reviewed: SABRA   EKG Interpretation Date/Time:  Friday March 28 2024 08:15:45 EDT Ventricular Rate:  54 PR Interval:  228 QRS Duration:  88 QT Interval:  500 QTC Calculation: 474 R Axis:   71  Text Interpretation: Sinus bradycardia with 1st degree A-V block Nonspecific ST and T wave abnormality Prolonged QT When compared with ECG of 01-Dec-2023 08:08, Sinus bradycardia has replaced Atrial fibrillation Confirmed by Carmen [name] ([id number]) on 03/28/2024 8:20:53 AM    TTE (12/03/2023): Normal LV size with moderate LVH.  LVEF 55-60% with indeterminate diastolic parameters.  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  Severe left atrial enlargement.  Moderate MR.  Normal CVP.  Risk Assessment/Calculations:    CHA2DS2-VASc Score = 7   This indicates a 11.2% annual risk of stroke. The patient's score is based upon: CHF History: 0 HTN History: 1 Diabetes History: 0 Stroke History: 2 Vascular Disease History: 1 Age Score: 2 Gender Score: 1    HYPERTENSION CONTROL Vitals:   03/28/24 0809 03/28/24 0831  BP: (!) 150/70 (!) 142/90    The patient's blood pressure is elevated above target today.  In order to address the patient's elevated BP: Blood pressure will be monitored at home to determine if medication changes need to be made.         Physical Exam:   VS:  BP (!) 142/90 (BP Location: Left Arm,  Cuff Size: Normal)   Pulse (!) 54   Ht 5' (1.524 m)   Wt 170 lb 3.2 oz (77.2 kg)   SpO2 99%   BMI 33.24 kg/m    Wt Readings from Last 3 Encounters:  03/28/24 170 lb 3.2 oz (77.2 kg)  01/11/24 168 lb (76.2 kg)  12/31/23 168 lb (76.2 kg)    General:  NAD. Neck: No JVD or HJR. Lungs: Clear to auscultation bilaterally without  wheezes or crackles. Heart: Bradycardic but regular with 2/6 systolic murmur. Abdomen: Soft, nontender, nondistended. Extremities: Trace LLE edema.  ASSESSMENT AND PLAN: .    Persistent atrial fibrillation: Ms. Sleep has been billed as having permanent atrial fibrillation though interesting she is in sinus rhythm today with mild bradycardia and first degree AV block.  I wonder if some of her increased dyspnea on exertion reported today could be related to this bradycardia.  We will halve metoprolol  succinate to 25 mg daily, as well as check a CBC, BMP, TSH, and Mg level.  I have also recommended wearing a 14-day event monitor (Zio XT) to assess her atrial fibrillation burden and heart rate.  Continue to defer anticoagulation given history of recurrent falls; the patient is s/p Watchman implantation by Dr. Cindie.  Coronary artery disease: No angina reported.  If DOE does not improve with deescalation of metoprolol , it is conceivable that this could be an anginal equivalent.  If dyspnea persists, we will need to discuss non-invasive ischemia evaluation, though I worry about the safety of long-term DAPT were PCI needed (she is still on aspirin  and clopidogrel  following her stroke - see details below).  Stroke: Patient noted to have acute CVA in March with plans for 3 weeks of DAPT followed by long-term clopidogrel .  Her medication list indicates that she is still taking aspirin  and clopidogrel .  She was encouraged to discuss holding aspirin  with her PCP and neurologist.  Continue atorvastatin  40 mg daily for secondary prevention.  Hyperlipidemia: LDL well-controlled at 47 on last check in 12/2023.  Continue atorvastatin  40 mg daily.  Hypertension: BP mildly elevated today, though patient and family report better readings at home.  As above we will decrease metoprolol  succinate to 25 mg daily.  Defer other medication changes at this time.    Dispo: Return to clinic in 6  weeks.  Signed, Lonni Hanson, MD

## 2024-03-28 NOTE — Patient Instructions (Signed)
 Medication Instructions:  Your physician recommends the following medication changes.  DECREASE: Metoprolol  to 25 mg by mouth daily  *If you need a refill on your cardiac medications before your next appointment, please call your pharmacy*  Lab Work: Your provider would like for you to have following labs drawn today CBC, BMP, TSH, Mg.     Testing/Procedures: Your physician has recommended that you wear a 14 day Zio monitor.   This monitor is a medical device that records the heart's electrical activity. Doctors most often use these monitors to diagnose arrhythmias. Arrhythmias are problems with the speed or rhythm of the heartbeat. The monitor is a small device applied to your chest. You can wear one while you do your normal daily activities. While wearing this monitor if you have any symptoms to push the button and record what you felt. Once you have worn this monitor for the period of time provider prescribed (Usually 14 days), you will return the monitor device in the postage paid box. Once it is returned they will download the data collected and provide us  with a report which the provider will then review and we will call you with those results. Important tips:  Avoid showering during the first 24 hours of wearing the monitor. Avoid excessive sweating to help maximize wear time. Do not submerge the device, no hot tubs, and no swimming pools. Keep any lotions or oils away from the patch. After 24 hours you may shower with the patch on. Take brief showers with your back facing the shower head.  Do not remove patch once it has been placed because that will interrupt data and decrease adhesive wear time. Push the button when you have any symptoms and write down what you were feeling. Once you have completed wearing your monitor, remove and place into box which has postage paid and place in your outgoing mailbox.  If for some reason you have misplaced your box then call our office and we can  provide another box and/or mail it off for you.     Follow-Up: At [facility], you and your health needs are our priority.  As part of our continuing mission to provide you with exceptional heart care, our providers are all part of one team.  This team includes your primary Cardiologist (physician) and Advanced Practice Providers or APPs (Physician Assistants and Nurse Practitioners) who all work together to provide you with the care you need, when you need it.  Your next appointment:   6 week(s)  Provider:   You may see Lonni Hanson, MD or one of the following Advanced Practice Providers on your designated Care Team:   Lonni Meager, NP Lesley Maffucci, PA-C Bernardino Bring, PA-C Cadence Hunter, PA-C Tylene Lunch, NP Barnie Hila, NP

## 2024-03-29 ENCOUNTER — Encounter: Payer: Self-pay | Admitting: Internal Medicine

## 2024-03-29 LAB — TSH: TSH: 4.34 u[IU]/mL (ref 0.450–4.500)

## 2024-03-29 LAB — CBC
Hematocrit: 38.5 % (ref 34.0–46.6)
Hemoglobin: 11.9 g/dL (ref 11.1–15.9)
MCH: 28.1 pg (ref 26.6–33.0)
MCHC: 30.9 g/dL — ABNORMAL LOW (ref 31.5–35.7)
MCV: 91 fL (ref 79–97)
Platelets: 229 10*3/uL (ref 150–450)
RBC: 4.23 x10E6/uL (ref 3.77–5.28)
RDW: 14.3 % (ref 11.7–15.4)
WBC: 5.5 10*3/uL (ref 3.4–10.8)

## 2024-03-29 LAB — BASIC METABOLIC PANEL WITH GFR
BUN/Creatinine Ratio: 19 (ref 12–28)
BUN: 16 mg/dL (ref 8–27)
CO2: 21 mmol/L (ref 20–29)
Calcium: 9.3 mg/dL (ref 8.7–10.3)
Chloride: 100 mmol/L (ref 96–106)
Creatinine, Ser: 0.84 mg/dL (ref 0.57–1.00)
Glucose: 78 mg/dL (ref 70–99)
Potassium: 4.6 mmol/L (ref 3.5–5.2)
Sodium: 137 mmol/L (ref 134–144)
eGFR: 69 mL/min/{1.73_m2} (ref >59)

## 2024-03-29 LAB — MAGNESIUM: Magnesium: 1.9 mg/dL (ref 1.6–2.3)

## 2024-03-31 ENCOUNTER — Ambulatory Visit: Payer: Self-pay | Admitting: Internal Medicine

## 2024-04-01 ENCOUNTER — Other Ambulatory Visit: Payer: Self-pay

## 2024-04-01 ENCOUNTER — Observation Stay (HOSPITAL_COMMUNITY)
Admission: EM | Admit: 2024-04-01 | Discharge: 2024-04-03 | Disposition: A | Discharge status: Home Health | Attending: Internal Medicine | Admitting: Internal Medicine

## 2024-04-01 ENCOUNTER — Emergency Department (HOSPITAL_COMMUNITY)

## 2024-04-01 ENCOUNTER — Encounter (HOSPITAL_COMMUNITY): Payer: Self-pay | Admitting: Pharmacy Technician

## 2024-04-01 DIAGNOSIS — D649 Anemia, unspecified: Secondary | ICD-10-CM | POA: Diagnosis not present

## 2024-04-01 DIAGNOSIS — E039 Hypothyroidism, unspecified: Secondary | ICD-10-CM | POA: Diagnosis not present

## 2024-04-01 DIAGNOSIS — M5030 Other cervical disc degeneration, unspecified cervical region: Secondary | ICD-10-CM | POA: Diagnosis not present

## 2024-04-01 DIAGNOSIS — E785 Hyperlipidemia, unspecified: Secondary | ICD-10-CM | POA: Insufficient documentation

## 2024-04-01 DIAGNOSIS — Z7982 Long term (current) use of aspirin: Secondary | ICD-10-CM | POA: Insufficient documentation

## 2024-04-01 DIAGNOSIS — I480 Paroxysmal atrial fibrillation: Secondary | ICD-10-CM | POA: Diagnosis not present

## 2024-04-01 DIAGNOSIS — S0101XA Laceration without foreign body of scalp, initial encounter: Secondary | ICD-10-CM | POA: Diagnosis not present

## 2024-04-01 DIAGNOSIS — Z043 Encounter for examination and observation following other accident: Secondary | ICD-10-CM | POA: Diagnosis not present

## 2024-04-01 DIAGNOSIS — Z23 Encounter for immunization: Secondary | ICD-10-CM | POA: Insufficient documentation

## 2024-04-01 DIAGNOSIS — I739 Peripheral vascular disease, unspecified: Secondary | ICD-10-CM | POA: Diagnosis not present

## 2024-04-01 DIAGNOSIS — I10 Essential (primary) hypertension: Secondary | ICD-10-CM | POA: Diagnosis not present

## 2024-04-01 DIAGNOSIS — R9089 Other abnormal findings on diagnostic imaging of central nervous system: Secondary | ICD-10-CM | POA: Diagnosis not present

## 2024-04-01 DIAGNOSIS — Z8673 Personal history of transient ischemic attack (TIA), and cerebral infarction without residual deficits: Secondary | ICD-10-CM | POA: Diagnosis not present

## 2024-04-01 DIAGNOSIS — F32A Depression, unspecified: Secondary | ICD-10-CM | POA: Insufficient documentation

## 2024-04-01 DIAGNOSIS — W1839XA Other fall on same level, initial encounter: Secondary | ICD-10-CM | POA: Insufficient documentation

## 2024-04-01 DIAGNOSIS — G894 Chronic pain syndrome: Secondary | ICD-10-CM | POA: Diagnosis not present

## 2024-04-01 DIAGNOSIS — I6782 Cerebral ischemia: Secondary | ICD-10-CM | POA: Diagnosis not present

## 2024-04-01 DIAGNOSIS — I4581 Long QT syndrome: Secondary | ICD-10-CM | POA: Diagnosis not present

## 2024-04-01 DIAGNOSIS — H547 Unspecified visual loss: Secondary | ICD-10-CM | POA: Diagnosis not present

## 2024-04-01 DIAGNOSIS — M069 Rheumatoid arthritis, unspecified: Secondary | ICD-10-CM | POA: Insufficient documentation

## 2024-04-01 DIAGNOSIS — Z66 Do not resuscitate: Secondary | ICD-10-CM | POA: Insufficient documentation

## 2024-04-01 DIAGNOSIS — Y92019 Unspecified place in single-family (private) house as the place of occurrence of the external cause: Secondary | ICD-10-CM | POA: Insufficient documentation

## 2024-04-01 DIAGNOSIS — W19XXXA Unspecified fall, initial encounter: Principal | ICD-10-CM

## 2024-04-01 DIAGNOSIS — Z7901 Long term (current) use of anticoagulants: Secondary | ICD-10-CM | POA: Diagnosis not present

## 2024-04-01 DIAGNOSIS — S0990XA Unspecified injury of head, initial encounter: Secondary | ICD-10-CM | POA: Diagnosis not present

## 2024-04-01 DIAGNOSIS — R58 Hemorrhage, not elsewhere classified: Secondary | ICD-10-CM | POA: Diagnosis not present

## 2024-04-01 DIAGNOSIS — I2510 Atherosclerotic heart disease of native coronary artery without angina pectoris: Secondary | ICD-10-CM | POA: Diagnosis not present

## 2024-04-01 DIAGNOSIS — M160 Bilateral primary osteoarthritis of hip: Secondary | ICD-10-CM | POA: Diagnosis not present

## 2024-04-01 DIAGNOSIS — I517 Cardiomegaly: Secondary | ICD-10-CM | POA: Diagnosis not present

## 2024-04-01 DIAGNOSIS — S199XXA Unspecified injury of neck, initial encounter: Secondary | ICD-10-CM | POA: Diagnosis not present

## 2024-04-01 LAB — COMPREHENSIVE METABOLIC PANEL WITH GFR
ALT: 19 U/L (ref 0–44)
AST: 31 U/L (ref 15–41)
Albumin: 3.5 g/dL (ref 3.5–5.0)
Alkaline Phosphatase: 57 U/L (ref 38–126)
Anion gap: 10 (ref 5–15)
BUN: 19 mg/dL (ref 8–23)
CO2: 25 mmol/L (ref 22–32)
Calcium: 8.8 mg/dL — ABNORMAL LOW (ref 8.9–10.3)
Chloride: 104 mmol/L (ref 98–111)
Creatinine, Ser: 0.88 mg/dL (ref 0.44–1.00)
GFR, Estimated: >60 mL/min (ref >60)
Glucose, Bld: 128 mg/dL — ABNORMAL HIGH (ref 70–99)
Potassium: 4.6 mmol/L (ref 3.5–5.1)
Sodium: 139 mmol/L (ref 135–145)
Total Bilirubin: 0.8 mg/dL (ref 0.0–1.2)
Total Protein: 6.1 g/dL — ABNORMAL LOW (ref 6.5–8.1)

## 2024-04-01 LAB — URINALYSIS, ROUTINE W REFLEX MICROSCOPIC
Bilirubin Urine: NEGATIVE
Glucose, UA: NEGATIVE mg/dL
Hgb urine dipstick: NEGATIVE
Ketones, ur: 5 mg/dL — AB
Leukocytes,Ua: NEGATIVE
Nitrite: NEGATIVE
Protein, ur: 30 mg/dL — AB
Specific Gravity, Urine: 1.029 (ref 1.005–1.030)
pH: 6 (ref 5.0–8.0)

## 2024-04-01 LAB — CBC
HCT: 32.7 % — ABNORMAL LOW (ref 36.0–46.0)
HCT: 26.9 % — ABNORMAL LOW (ref 36.0–46.0)
Hemoglobin: 10.4 g/dL — ABNORMAL LOW (ref 12.0–15.0)
Hemoglobin: 8.3 g/dL — ABNORMAL LOW (ref 12.0–15.0)
MCH: 28.6 pg (ref 26.0–34.0)
MCH: 28.2 pg (ref 26.0–34.0)
MCHC: 31.8 g/dL (ref 30.0–36.0)
MCHC: 30.9 g/dL (ref 30.0–36.0)
MCV: 89.8 fL (ref 80.0–100.0)
MCV: 91.5 fL (ref 80.0–100.0)
Platelets: 198 10*3/uL (ref 150–400)
Platelets: 155 10*3/uL (ref 150–400)
RBC: 3.64 MIL/uL — ABNORMAL LOW (ref 3.87–5.11)
RBC: 2.94 MIL/uL — ABNORMAL LOW (ref 3.87–5.11)
RDW: 14.5 % (ref 11.5–15.5)
RDW: 14.6 % (ref 11.5–15.5)
WBC: 6.7 10*3/uL (ref 4.0–10.5)
WBC: 7.9 10*3/uL (ref 4.0–10.5)
nRBC: 0 % (ref 0.0–0.2)
nRBC: 0 % (ref 0.0–0.2)

## 2024-04-01 LAB — PROTIME-INR
INR: 1.1 (ref 0.8–1.2)
Prothrombin Time: 14.9 s (ref 11.4–15.2)

## 2024-04-01 LAB — I-STAT CHEM 8, ED
BUN: 19 mg/dL (ref 8–23)
Calcium, Ion: 1.16 mmol/L (ref 1.15–1.40)
Chloride: 103 mmol/L (ref 98–111)
Creatinine, Ser: 0.9 mg/dL (ref 0.44–1.00)
Glucose, Bld: 121 mg/dL — ABNORMAL HIGH (ref 70–99)
HCT: 31 % — ABNORMAL LOW (ref 36.0–46.0)
Hemoglobin: 10.5 g/dL — ABNORMAL LOW (ref 12.0–15.0)
Potassium: 4.3 mmol/L (ref 3.5–5.1)
Sodium: 139 mmol/L (ref 135–145)
TCO2: 23 mmol/L (ref 22–32)

## 2024-04-01 LAB — ETHANOL: Alcohol, Ethyl (B): <15 mg/dL (ref <15)

## 2024-04-01 LAB — SAMPLE TO BLOOD BANK

## 2024-04-01 MED ORDER — FENTANYL CITRATE PF 50 MCG/ML IJ SOSY
50.0000 ug | PREFILLED_SYRINGE | INTRAMUSCULAR | Status: DC | PRN
  Administered 2024-04-01 – 2024-04-02 (×2): 50 ug via INTRAVENOUS
  Filled 2024-04-01 (×2): qty 1

## 2024-04-01 MED ORDER — FAMOTIDINE 20 MG PO TABS: 10.0000 mg | ORAL_TABLET | Freq: Every day | ORAL | Status: DC | PRN

## 2024-04-01 MED ORDER — TRAZODONE HCL 50 MG PO TABS
100.0000 mg | ORAL_TABLET | Freq: Every evening | ORAL | Status: DC | PRN
  Administered 2024-04-01 – 2024-04-02 (×2): 100 mg via ORAL
  Filled 2024-04-01 (×2): qty 2

## 2024-04-01 MED ORDER — TETANUS-DIPHTH-ACELL PERTUSSIS 5-2.5-18.5 LF-MCG/0.5 IM SUSY
0.5000 mL | PREFILLED_SYRINGE | Freq: Once | INTRAMUSCULAR | Status: AC
  Administered 2024-04-01: 0.5 mL via INTRAMUSCULAR
  Filled 2024-04-01: qty 0.5

## 2024-04-01 MED ORDER — LEVOTHYROXINE SODIUM 25 MCG PO TABS
137.0000 ug | ORAL_TABLET | Freq: Every day | ORAL | Status: DC
  Administered 2024-04-02 – 2024-04-03 (×2): 137 ug via ORAL
  Filled 2024-04-01 (×2): qty 1

## 2024-04-01 MED ORDER — HYDROCODONE-ACETAMINOPHEN 5-325 MG PO TABS
1.0000 | ORAL_TABLET | Freq: Two times a day (BID) | ORAL | Status: DC | PRN
  Administered 2024-04-01 – 2024-04-02 (×2): 1 via ORAL
  Filled 2024-04-01 (×2): qty 1

## 2024-04-01 MED ORDER — LOSARTAN POTASSIUM 25 MG PO TABS
25.0000 mg | ORAL_TABLET | Freq: Every day | ORAL | Status: DC
  Administered 2024-04-02 – 2024-04-03 (×2): 25 mg via ORAL
  Filled 2024-04-01 (×2): qty 1

## 2024-04-01 MED ORDER — DIVALPROEX SODIUM 500 MG PO DR TAB
500.0000 mg | DELAYED_RELEASE_TABLET | Freq: Every day | ORAL | Status: DC
  Administered 2024-04-01 – 2024-04-02 (×2): 500 mg via ORAL
  Filled 2024-04-01 (×2): qty 1

## 2024-04-01 MED ORDER — ATORVASTATIN CALCIUM 40 MG PO TABS
40.0000 mg | ORAL_TABLET | Freq: Every day | ORAL | Status: DC
  Administered 2024-04-01 – 2024-04-02 (×2): 40 mg via ORAL
  Filled 2024-04-01 (×2): qty 1

## 2024-04-01 MED ORDER — FUROSEMIDE 20 MG PO TABS: 20.0000 mg | ORAL_TABLET | Freq: Every day | ORAL | Status: DC | PRN

## 2024-04-01 MED ORDER — POLYETHYLENE GLYCOL 3350 17 G PO PACK: 17.0000 g | PACK | Freq: Every day | ORAL | Status: DC | PRN

## 2024-04-01 MED ORDER — LACTATED RINGERS IV BOLUS
500.0000 mL | Freq: Once | INTRAVENOUS | Status: AC
  Administered 2024-04-01: 500 mL via INTRAVENOUS

## 2024-04-01 MED ORDER — NALOXONE HCL 4 MG/0.1ML NA LIQD: 1.0000 | NASAL | Status: DC | PRN

## 2024-04-01 MED ORDER — ONDANSETRON HCL 4 MG/2ML IJ SOLN
4.0000 mg | Freq: Once | INTRAMUSCULAR | Status: AC
  Administered 2024-04-01: 4 mg via INTRAVENOUS
  Filled 2024-04-01: qty 2

## 2024-04-01 MED ORDER — MAGNESIUM OXIDE -MG SUPPLEMENT 400 (240 MG) MG PO TABS
400.0000 mg | ORAL_TABLET | Freq: Every day | ORAL | Status: DC
  Administered 2024-04-02 – 2024-04-03 (×2): 400 mg via ORAL
  Filled 2024-04-01 (×2): qty 1

## 2024-04-01 MED ORDER — VITAMIN B-12 100 MCG PO TABS
500.0000 ug | ORAL_TABLET | Freq: Every day | ORAL | Status: DC
  Administered 2024-04-02 – 2024-04-03 (×2): 500 ug via ORAL
  Filled 2024-04-01 (×2): qty 5

## 2024-04-01 MED ORDER — METOPROLOL SUCCINATE ER 50 MG PO TB24
50.0000 mg | ORAL_TABLET | Freq: Every day | ORAL | Status: DC
  Administered 2024-04-02 – 2024-04-03 (×2): 50 mg via ORAL
  Filled 2024-04-01 (×2): qty 1

## 2024-04-01 MED ORDER — PANTOPRAZOLE SODIUM 40 MG PO TBEC
40.0000 mg | DELAYED_RELEASE_TABLET | Freq: Two times a day (BID) | ORAL | Status: DC
  Administered 2024-04-01 – 2024-04-03 (×4): 40 mg via ORAL
  Filled 2024-04-01 (×4): qty 1

## 2024-04-01 MED ORDER — LEVOTHYROXINE SODIUM 25 MCG PO TABS: 137.0000 ug | ORAL_TABLET | Freq: Every day | ORAL | Status: DC

## 2024-04-01 MED ORDER — HYDROXYCHLOROQUINE SULFATE 200 MG PO TABS
200.0000 mg | ORAL_TABLET | Freq: Two times a day (BID) | ORAL | Status: DC
  Administered 2024-04-01 – 2024-04-03 (×4): 200 mg via ORAL
  Filled 2024-04-01 (×4): qty 1

## 2024-04-01 NOTE — H&P (Signed)
 History and Physical    Patient: Carmen [name] FMW:[id number] DOB: 1941/03/09 DOA: 04/01/2024 DOS: the patient was seen and examined on 04/01/2024 PCP: Corwin Antu, FNP  Patient coming from: Home  Chief Complaint:  Chief Complaint  Patient presents with   Fall   HPI: Carmen [name] is a 83 y.o. female with medical history significant of hypothyroidism, osteoarthritis, degenerative disc disease, peripheral vascular disease, history of CVA, coronary artery disease, rheumatoid arthritis, anemia of chronic disease, anxiety disorder, who was apparently walking with her walker today.  She was tolerating while she lost her balance and fell backwards.  She sustained significant laceration to the posterior aspect of her head.  Patient bled excessively.  She has a huge laceration on the lateral aspect of her left side of the head where she hit a piece of furniture.  Patient is fully awake.  Able to move around with EMS.  Came to the ER where the laceration was sutured.  Patient however sustained significant blood loss and now is symptomatic with anemia.  While in the ER she dropped 1.5 g from hemoglobin obtained down to 8.5.  She has history of coronary artery disease.  Not on anticoagulation.  She has history of atrial fibrillation but is on aspirin  and Plavix .  Patient is being admitted with symptomatic anemia.  Transfuse 1 unit of packed red blood cells.  Review of Systems: As mentioned in the history of present illness. All other systems reviewed and are negative. Past Medical History:  Diagnosis Date   Abnormal CT scan, lumbar spine (05/11/2021) 05/17/2021   (05/11/2021) LUMBAR CT FINDINGS: Alignment: Lumbar levocurvature, apex L4. Mild lateral listhesis L4 on L5 of approximately 3 mm. Vertebrae: Remote appearing superior endplate deformities at T12 with 10% height loss and L1 with up to 20% height loss. Multilevel discogenic and facet degenerative changes. Mild bilateral SI joint arthrosis.  DISC LEVELS:  T11-T12: Near complete disc height loss with de   Abnormal MRI, cervical spine (05/12/2021) 05/17/2021   (05/12/2021) CERVICAL MRI FINDINGS: Motion artifact is present. Posterior Fossa, vertebral arteries, paraspinal tissues: Left superior cerebellar infarct.   DISC LEVELS: C2-C3: Disc bulge with endplate osteophytes. Uncovertebral and facet hypertrophy. C3-C4: Disc bulge with endplate osteophytes. Uncovertebral and facet hypertrophy. Mild canal stenosis. Marked foraminal stenosis. C4-C5: Disc bulge w   Allergy    Anemia    Anxiety    Arthritis    Back pain    Coronary artery disease    Depression    History of stroke 10/02/2020   Hypertension    Peripheral vascular disease (HCC)    Personal history of nicotine dependence 10/02/2020   Prsnl hx of TIA (TIA), and cereb infrc w/o resid deficits 10/02/2020   Stroke (HCC)    Thyroid  disease    Past Surgical History:  Procedure Laterality Date   back injections     CARDIAC CATHETERIZATION     CATARACT EXTRACTION     CORONARY ANGIOPLASTY     CORONARY ARTERY BYPASS GRAFT     2005   LEFT ATRIAL APPENDAGE OCCLUSION N/A 12/28/2022   Procedure: LEFT ATRIAL APPENDAGE OCCLUSION;  Surgeon: Cindie Ole DASEN, MD;  Location: [facility] INVASIVE CV LAB;  Service: Cardiovascular;  Laterality: N/A;   TEE WITHOUT CARDIOVERSION N/A 12/28/2022   Procedure: TRANSESOPHAGEAL ECHOCARDIOGRAM;  Surgeon: Cindie Ole DASEN, MD;  Location: [facility] INVASIVE CV LAB;  Service: Cardiovascular;  Laterality: N/A;   Social History:  reports that she quit smoking about 3 years ago. Her smoking use included cigarettes.  She started smoking about 33 years ago. She has never used smokeless tobacco. She reports that she does not currently use alcohol. She reports current drug use. Drug: Hydrocodone .  No Known Allergies  Family History  Problem Relation Age of Onset   Breast cancer Mother    Heart attack Father    Bipolar disorder Daughter    Diabetes Mellitus II Neg Hx     Prior to  Admission medications   Medication Sig Start Date End Date Taking? Authorizing Provider  atorvastatin  (LIPITOR) 40 MG tablet Take 1 tablet (40 mg total) by mouth at bedtime. 02/01/23  Yes Dugal, Tabitha, FNP  BIOFREEZE ROLL-ON 4 % GEL Apply 1 Application topically 3 (three) times daily. 12/27/23  Yes [provider]  CALCIUM  PO Take 1 tablet by mouth daily.   Yes [provider]  Cholecalciferol (VITAMIN D -3) 25 MCG (1000 UT) CAPS Take 1,000 Units by mouth daily.   Yes [provider]  clopidogrel  (PLAVIX ) 75 MG tablet Take 1 tablet (75 mg total) by mouth daily. 06/28/23 06/27/24 Yes McDaniel, Jill D, NP  cyanocobalamin  (VITAMIN B12) 500 MCG tablet Take 500 mcg by mouth daily.   Yes [provider]  diphenhydrAMINE (BENADRYL) 25 MG tablet Take 25 mg by mouth at bedtime as needed for allergies.   Yes [provider]  divalproex  (DEPAKOTE ) 500 MG DR tablet Take 1 tablet (500 mg total) by mouth at bedtime. 02/27/23  Yes Corwin Antu, FNP  famotidine (PEPCID AC) 10 MG tablet Take 10 mg by mouth daily as needed for heartburn or indigestion.   Yes [provider]  furosemide  (LASIX ) 20 MG tablet Take 1 tablet (20 mg total) by mouth daily as needed (for swelling). 09/27/23  Yes Fountain, Madison L, NP  HYDROcodone -acetaminophen  (NORCO/VICODIN) 5-325 MG tablet Take 1 tablet by mouth 2 (two) times daily as needed for severe pain (pain score 7-10). Must last 30 days. 03/31/24 04/30/24 Yes Patel, Seema K, NP  hydroxychloroquine  (PLAQUENIL ) 200 MG tablet Take 200 mg by mouth 2 (two) times daily.   Yes [provider]  levothyroxine  (SYNTHROID ) 137 MCG tablet Take 1 tablet (137 mcg total) by mouth daily before breakfast. 02/27/23  Yes Dugal, Tabitha, FNP  loratadine  (EQ ALL DAY ALLERGY RELIEF) 10 MG tablet Take 1 tablet by mouth once daily 03/10/24  Yes Dugal, Tabitha, FNP  losartan  (COZAAR ) 25 MG tablet Take 1/2 (one-half) tablet by mouth once daily 03/10/24   Yes Dugal, Tabitha, FNP  magnesium  oxide (MAG-OX) 400 (240 Mg) MG tablet Take 400 mg by mouth daily.   Yes [provider]  metoprolol  succinate (TOPROL -XL) 50 MG 24 hr tablet Take 50 mg by mouth daily. Take with or immediately following a meal.   Yes [provider]  pantoprazole  (PROTONIX ) 40 MG tablet TAKE 1 TABLET BY MOUTH TWICE A DAY 12/12/23  Yes Dugal, Tabitha, FNP  polyethylene glycol (MIRALAX  / GLYCOLAX ) 17 g packet Take 17 g by mouth daily as needed. Patient taking differently: Take 17 g by mouth daily as needed for mild constipation. 12/31/23  Yes Dugal, Tabitha, FNP  traZODone  (DESYREL ) 100 MG tablet TAKE 1 TABLET BY MOUTH AT BEDTIME AS NEEDED FOR SLEEP 03/27/24  Yes Dugal, Tabitha, FNP  aspirin  EC 81 MG tablet Take 81 mg by mouth daily. Swallow whole.    [provider]  HYDROcodone -acetaminophen  (NORCO/VICODIN) 5-325 MG tablet Take 1 tablet by mouth 2 (two) times daily as needed for severe pain (pain score 7-10). Must last 30  days. 01/31/24 03/28/24  Patel, Seema K, NP  HYDROcodone -acetaminophen  (NORCO/VICODIN) 5-325 MG tablet Take 1 tablet by mouth 2 (two) times daily as needed for severe pain (pain score 7-10). Must last 30 days. 03/01/24 03/31/24  Patel, Seema K, NP  metoprolol  succinate (TOPROL -XL) 25 MG 24 hr tablet Take 1 tablet (25 mg total) by mouth daily. Take with or immediately following a meal. Patient not taking: Reported on 04/01/2024 03/28/24   End, Lonni, MD  naloxone  (NARCAN ) nasal spray 4 mg/0.1 mL Place 1 spray into the nose as needed for up to 365 doses (for opioid-induced respiratory depresssion). In case of emergency (overdose), spray once into each nostril. If no response within 3 minutes, repeat application and call 911. 10/18/23 10/15/24  Tanya Glisson, MD    Physical Exam: Vitals:   04/01/24 1405 04/01/24 1407 04/01/24 1545  BP: 120/68  (!) 115/91  Pulse:   72  Resp:   20  Temp:  (!) 97.4 F (36.3 C)   TempSrc:  Temporal   SpO2:    100%  Weight:  77.1 kg   Height:  5' (1.524 m)    Constitutional: Morbidly obese, stable, NAD, calm, comfortable Eyes: PERRL, lids and conjunctivae normal ENMT: Mucous membranes are moist. Posterior pharynx clear of any exudate or lesions.Normal dentition.  Neck: normal, supple, no masses, no thyromegaly Respiratory: clear to auscultation bilaterally, no wheezing, no crackles. Normal respiratory effort. No accessory muscle use.  Cardiovascular: Sinus tachycardia, no murmurs / rubs / gallops. No extremity edema. 2+ pedal pulses. No carotid bruits.  Abdomen: no tenderness, no masses palpated. No hepatosplenomegaly. Bowel sounds positive.  Musculoskeletal: Good range of motion, no joint swelling or tenderness, Skin: Deep scalp laceration which has been sutured. No induration Neurologic: CN 2-12 grossly intact. Sensation intact, DTR normal. Strength 5/5 in all 4.  Psychiatric: Normal judgment and insight. Alert and oriented x 3. Normal mood  Data Reviewed:  Vital signs stable, white count 7.9,initial hemoglobin 10.5, dropped to 8.3, normal platelets glucose 128, urinalysis negative.  CT head spinal maxillofacial showed no acute findings.  Chest x-ray showed no acute findings.  X-ray of the pelvis showed no acute findings.  Assessment and Plan:  #1 symptomatic anemia: Secondary to acute blood loss from laceration.  Patient will be admitted for blood transfusion and monitoring.  She is symptomatic and dizzy.  #2 status post fall with laceration: Mechanical in nature.  Will likely need wound care as well as physical therapy prior to discharge.  #3 degenerative disc disease: Continue chronic home regimen.  #4 coronary artery disease: No acute decompensation.  On aspirin  and Plavix .  Holding them temporarily.  Will resume before discharge  #5 paroxysmal atrial fibrillation: EKG today showed normal sinus rhythm with a rate of 72.  No significant ST changes.  Not on anticoagulation.  #6  essential hypertension: Resume home regimen.  #7 prolonged QT interval: Previously.  EKG today showed QTc of 533.  Will avoid drugs that can prolong QT interval.  #8 rheumatoid arthritis: We will resume and continue home regimen  #9 hypothyroidism: Continue with levothyroxine   #10 hyperlipidemia: Continue home regimen.    Advance Care Planning:   Code Status: Limited: Do not attempt resuscitation (DNR) -DNR-LIMITED -Do Not Intubate/DNI    Consults: None  Family Communication: No family at bedside  Severity of Illness: The appropriate patient status for this patient is OBSERVATION. Observation status is judged to be reasonable and necessary in order to provide the required intensity of service to  ensure the patient's safety. The patient's presenting symptoms, physical exam findings, and initial radiographic and laboratory data in the context of their medical condition is felt to place them at decreased risk for further clinical deterioration. Furthermore, it is anticipated that the patient will be medically stable for discharge from the hospital within 2 midnights of admission.   AuthorBETHA SIM KNOLL, MD 04/01/2024 7:49 PM  For on call review [email or web address].

## 2024-04-01 NOTE — ED Provider Notes (Signed)
 83 year old female presenting to the emergency department today after a mechanical fall with significant blood loss from scalp laceration.  The patient has had a 1.5 g drop in her hemoglobin and on ambulation she was very lightheaded.  Fluids are ordered and a second CBC is ordered.  Plans for reassessment after second CBC and ambulation for ultimate disposition. Physical Exam  BP (!) 115/91   Pulse 72   Temp (!) 97.4 F (36.3 C) (Temporal)   Resp 20   Ht 5' (1.524 m)   Wt 77.1 kg   SpO2 100%   BMI 33.20 kg/m   Physical Exam General: No acute distress Procedures  Procedures  ED Course / MDM    Medical Decision Making Amount and/or Complexity of Data Reviewed Labs: ordered. Radiology: ordered.  Risk Prescription drug management. Decision regarding hospitalization.   The patient's hemoglobin did drop another 2 g on reassessment.  Calls placed to the hospitalist service for further evaluation.       Ula Prentice SAUNDERS, MD 04/01/24 [phone]

## 2024-04-01 NOTE — ED Triage Notes (Signed)
 Pt bib ems from home after falling backwards while trying to turn using her walker. Pt with lac to posterior head. VSS.

## 2024-04-01 NOTE — ED Notes (Incomplete)
 Trauma Response Nurse Documentation   Carmen [name] is a 83 y.o. female arriving to [place] ED via [facility]  On clopidogrel  75 mg daily. Trauma was activated as a Level 2 by Charge RN based on the following trauma criteria Elderly patients > 65 with head trauma on anti-coagulation (excluding ASA).  Patient cleared for CT by Dr. Melvenia. Pt transported to CT with trauma response nurse present to monitor. RN remained with the patient throughout their absence from the department for clinical observation.   GCS 15.  History   Past Medical History:  Diagnosis Date   Abnormal CT scan, lumbar spine (05/11/2021) 05/17/2021   (05/11/2021) LUMBAR CT FINDINGS: Alignment: Lumbar levocurvature, apex L4. Mild lateral listhesis L4 on L5 of approximately 3 mm. Vertebrae: Remote appearing superior endplate deformities at T12 with 10% height loss and L1 with up to 20% height loss. Multilevel discogenic and facet degenerative changes. Mild bilateral SI joint arthrosis.  DISC LEVELS: T11-T12: Near complete disc height loss with de   Abnormal MRI, cervical spine (05/12/2021) 05/17/2021   (05/12/2021) CERVICAL MRI FINDINGS: Motion artifact is present. Posterior Fossa, vertebral arteries, paraspinal tissues: Left superior cerebellar infarct.   DISC LEVELS: C2-C3: Disc bulge with endplate osteophytes. Uncovertebral and facet hypertrophy. C3-C4: Disc bulge with endplate osteophytes. Uncovertebral and facet hypertrophy. Mild canal stenosis. Marked foraminal stenosis. C4-C5: Disc bulge w   Allergy    Anemia    Anxiety    Arthritis    Back pain    Coronary artery disease    Depression    History of stroke 10/02/2020   Hypertension    Peripheral vascular disease (HCC)    Personal history of nicotine dependence 10/02/2020   Prsnl hx of TIA (TIA), and cereb infrc w/o resid deficits 10/02/2020   Stroke (HCC)    Thyroid  disease      Past Surgical History:  Procedure Laterality Date   back injections      CARDIAC CATHETERIZATION     CATARACT EXTRACTION     CORONARY ANGIOPLASTY     CORONARY ARTERY BYPASS GRAFT     2005   LEFT ATRIAL APPENDAGE OCCLUSION N/A 12/28/2022   Procedure: LEFT ATRIAL APPENDAGE OCCLUSION;  Surgeon: Cindie Ole DASEN, MD;  Location: [facility] INVASIVE CV LAB;  Service: Cardiovascular;  Laterality: N/A;   TEE WITHOUT CARDIOVERSION N/A 12/28/2022   Procedure: TRANSESOPHAGEAL ECHOCARDIOGRAM;  Surgeon: Cindie Ole DASEN, MD;  [facility] INV[facility] CV LAB;  Service: Cardiovascular;  Laterality: N/A;       Initial Focused Assessment (If applicable, or please see trauma documentation): Airway - Clear Breathing - Unlabored Circulation -- bleeding from head lac- EMS bandage saturated- Dr. Melvenia removed bandage. Placed pressure dressing wrapped with coban and ace bandage-   GCS -15  CT's Completed:   CT Head and CT C-Spine   Interventions:  Labs Xrays CT scans Wound care   Plan for disposition:  {Trauma Dispo:26867}   Consults completed:  {Trauma Consults:26862} at ***.  Event Summary: Pt lives with her daughter and son-in-law-- was trying to turn around with her walker and fell backwards- left parietal laceration- Dr. Melvenia wrapped with a pressure bandage initially, wound continued to bleed through bandage-- sutures placed prior to CT scan- small bleeder noted- 2nd pressure bandage placed- transported to CT without further bleeding noted.   Bedside handoff with ED RN Camelia Chill, RN.    Carmen [name]  Trauma Response RN  Please call TRN at [phone] for further assistance.

## 2024-04-01 NOTE — Progress Notes (Signed)
 Orthopedic Tech Progress Note Patient Details:  Carmen [name] Jun 06, 1941 [id number]  Patient ID: Carmen [name], female   DOB: [birth year], 83 y.o.   MRN: [id number] Responded to level 2 trauma, ortho techs currently not needed Carmen [name] 04/01/2024, 2:07 PM

## 2024-04-01 NOTE — ED Provider Notes (Signed)
 [place] EMERGENCY DEPARTMENT AT [facility] Provider Note   CSN: [id number] Arrival date & time: 04/01/24  1403     Patient presents with: Carmen [name] is a 83 y.o. female.    Fall  Patient presents after a fall.  Medical history includes CVA, CAD, HTN, PVD, anxiety, anemia, arthritis.  While at home, she was ambulating with a walker.  She was turning, she had a loss of balance leading to mechanical fall.  When she fell, she struck the left side of her head against a piece of furniture in the house.  She sustained a laceration with bleeding from her scalp.  EMS was called.  She was able to stand and ambulate with EMS.  Vital signs with EMS were normal.  Patient denies any areas of discomfort, including her head.     Prior to Admission medications   Medication Sig Start Date End Date Taking? Authorizing Provider  atorvastatin  (LIPITOR) 40 MG tablet Take 1 tablet (40 mg total) by mouth at bedtime. 02/01/23  Yes Dugal, Tabitha, FNP  BIOFREEZE ROLL-ON 4 % GEL Apply 1 Application topically 3 (three) times daily. 12/27/23  Yes [provider]  CALCIUM  PO Take 1 tablet by mouth daily.   Yes [provider]  Cholecalciferol (VITAMIN D -3) 25 MCG (1000 UT) CAPS Take 1,000 Units by mouth daily.   Yes [provider]  clopidogrel  (PLAVIX ) 75 MG tablet Take 1 tablet (75 mg total) by mouth daily. 06/28/23 06/27/24 Yes McDaniel, Jill D, NP  cyanocobalamin  (VITAMIN B12) 500 MCG tablet Take 500 mcg by mouth daily.   Yes [provider]  diphenhydrAMINE (BENADRYL) 25 MG tablet Take 25 mg by mouth at bedtime as needed for allergies.   Yes [provider]  divalproex  (DEPAKOTE ) 500 MG DR tablet Take 1 tablet (500 mg total) by mouth at bedtime. 02/27/23  Yes Corwin Antu, FNP  famotidine (PEPCID AC) 10 MG tablet Take 10 mg by mouth daily as needed for heartburn or indigestion.   Yes [provider]  furosemide  (LASIX ) 20 MG tablet Take 1  tablet (20 mg total) by mouth daily as needed (for swelling). 09/27/23  Yes Fountain, Madison L, NP  HYDROcodone -acetaminophen  (NORCO/VICODIN) 5-325 MG tablet Take 1 tablet by mouth 2 (two) times daily as needed for severe pain (pain score 7-10). Must last 30 days. 03/31/24 04/30/24 Yes Patel, Seema K, NP  hydroxychloroquine  (PLAQUENIL ) 200 MG tablet Take 200 mg by mouth 2 (two) times daily.   Yes [provider]  levothyroxine  (SYNTHROID ) 137 MCG tablet Take 1 tablet (137 mcg total) by mouth daily before breakfast. 02/27/23  Yes Dugal, Tabitha, FNP  loratadine  (EQ ALL DAY ALLERGY RELIEF) 10 MG tablet Take 1 tablet by mouth once daily 03/10/24  Yes Dugal, Tabitha, FNP  losartan  (COZAAR ) 25 MG tablet Take 1/2 (one-half) tablet by mouth once daily 03/10/24  Yes Dugal, Tabitha, FNP  magnesium  oxide (MAG-OX) 400 (240 Mg) MG tablet Take 400 mg by mouth daily.   Yes [provider]  metoprolol  succinate (TOPROL -XL) 50 MG 24 hr tablet Take 50 mg by mouth daily. Take with or immediately following a meal.   Yes [provider]  pantoprazole  (PROTONIX ) 40 MG tablet TAKE 1 TABLET BY MOUTH TWICE A DAY 12/12/23  Yes Dugal, Tabitha, FNP  polyethylene glycol (MIRALAX  / GLYCOLAX ) 17 g packet Take 17 g by mouth daily as needed. Patient taking differently: Take 17 g by mouth daily as needed for mild  constipation. 12/31/23  Yes Dugal, Tabitha, FNP  traZODone  (DESYREL ) 100 MG tablet TAKE 1 TABLET BY MOUTH AT BEDTIME AS NEEDED FOR SLEEP 03/27/24  Yes Dugal, Tabitha, FNP  aspirin  EC 81 MG tablet Take 81 mg by mouth daily. Swallow whole.    [provider]  HYDROcodone -acetaminophen  (NORCO/VICODIN) 5-325 MG tablet Take 1 tablet by mouth 2 (two) times daily as needed for severe pain (pain score 7-10). Must last 30 days. 01/31/24 03/28/24  Patel, Seema K, NP  HYDROcodone -acetaminophen  (NORCO/VICODIN) 5-325 MG tablet Take 1 tablet by mouth 2 (two) times daily as needed for severe pain (pain score 7-10).  Must last 30 days. 03/01/24 03/31/24  Patel, Seema K, NP  metoprolol  succinate (TOPROL -XL) 25 MG 24 hr tablet Take 1 tablet (25 mg total) by mouth daily. Take with or immediately following a meal. Patient not taking: Reported on 04/01/2024 03/28/24   End, Lonni, MD  naloxone  (NARCAN ) nasal spray 4 mg/0.1 mL Place 1 spray into the nose as needed for up to 365 doses (for opioid-induced respiratory depresssion). In case of emergency (overdose), spray once into each nostril. If no response within 3 minutes, repeat application and call 911. 10/18/23 10/15/24  Tanya Glisson, MD    Allergies: Patient has no known allergies.    Review of Systems  Skin:  Positive for wound.  All other systems reviewed and are negative.   Updated Vital Signs BP (!) 115/91   Pulse 72   Temp (!) 97.4 F (36.3 C) (Temporal)   Resp 20   Ht 5' (1.524 m)   Wt 77.1 kg   SpO2 100%   BMI 33.20 kg/m   Physical Exam Vitals and nursing note reviewed.  Constitutional:      General: She is not in acute distress.    Appearance: Normal appearance. She is well-developed. She is not ill-appearing, toxic-appearing or diaphoretic.  HENT:     Head: Normocephalic.     Comments: Laceration to left occipital scalp.  Oozing blood is present.    Right Ear: External ear normal.     Left Ear: External ear normal.     Nose: Nose normal.     Mouth/Throat:     Mouth: Mucous membranes are moist.   Eyes:     Extraocular Movements: Extraocular movements intact.     Conjunctiva/sclera: Conjunctivae normal.    Cardiovascular:     Rate and Rhythm: Normal rate and regular rhythm.  Pulmonary:     Effort: Pulmonary effort is normal. No respiratory distress.  Chest:     Chest wall: No tenderness.  Abdominal:     General: There is no distension.     Palpations: Abdomen is soft.     Tenderness: There is no abdominal tenderness.   Musculoskeletal:        General: No swelling or deformity. Normal range of motion.     Cervical  back: Neck supple.   Skin:    General: Skin is warm and dry.     Coloration: Skin is not jaundiced or pale.   Neurological:     General: No focal deficit present.     Mental Status: She is alert and oriented to person, place, and time.   Psychiatric:        Mood and Affect: Mood normal.        Behavior: Behavior normal.     (all labs ordered are listed, but only abnormal results are displayed) Labs Reviewed  COMPREHENSIVE METABOLIC PANEL WITH GFR - Abnormal; Notable  for the following components:      Result Value   Glucose, Bld 128 (*)    Calcium  8.8 (*)    Total Protein 6.1 (*)    All other components within normal limits  CBC - Abnormal; Notable for the following components:   RBC 3.64 (*)    Hemoglobin 10.4 (*)    HCT 32.7 (*)    All other components within normal limits  URINALYSIS, ROUTINE W REFLEX MICROSCOPIC - Abnormal; Notable for the following components:   Color, Urine AMBER (*)    APPearance HAZY (*)    Ketones, ur 5 (*)    Protein, ur 30 (*)    Bacteria, UA RARE (*)    All other components within normal limits  I-STAT CHEM 8, ED - Abnormal; Notable for the following components:   Glucose, Bld 121 (*)    Hemoglobin 10.5 (*)    HCT 31.0 (*)    All other components within normal limits  ETHANOL  PROTIME-INR  CBC  SAMPLE TO BLOOD BANK  TYPE AND SCREEN    EKG: None  Radiology: CT HEAD WO CONTRAST Result Date: 04/01/2024 CLINICAL DATA:  Head trauma, neck trauma, fall. EXAM: CT HEAD WITHOUT CONTRAST CT CERVICAL SPINE WITHOUT CONTRAST TECHNIQUE: Multidetector CT imaging of the head and cervical spine was performed following the standard protocol without intravenous contrast. Multiplanar CT image reconstructions of the cervical spine were also generated. RADIATION DOSE REDUCTION: This exam was performed according to the departmental dose-optimization program which includes automated exposure control, adjustment of the mA and/or kV according to patient size  and/or use of iterative reconstruction technique. COMPARISON:  12/18/2023. FINDINGS: CT HEAD FINDINGS Brain: No acute intracranial hemorrhage. No CT evidence of acute infarct. Redemonstrated remote infarct in the parasagittal right frontal lobe involving the right ACA territory. Additional remote infarct in the left cerebellum. Nonspecific hypoattenuation in the periventricular and subcortical white matter favored to reflect chronic microvascular ischemic changes. Mild parenchymal volume loss. No edema, mass effect, or midline shift. The basilar cisterns are patent. Ventricles: The ventricles are normal. Vascular: Atherosclerotic calcifications of the carotid siphons. No hyperdense vessel. Skull: No acute or aggressive finding. Orbits: Bilateral lens replacement. Sinuses: The visualized paranasal sinuses are clear. Other: Mastoid air cells are clear. Soft tissue swelling in the left parieto-occipital scalp. CT CERVICAL SPINE FINDINGS Alignment: Straightening of the normal cervical lordosis. No listhesis. No facet subluxation or dislocation. Skull base and vertebrae: No acute fracture. No primary bone lesion or focal pathologic process. Soft tissues and spinal canal: No prevertebral fluid or swelling. No visible canal hematoma. Disc levels: Intervertebral disc space narrowing throughout the cervical spine. Disc osteophyte complex at C3-4 resulting in mild spinal canal stenosis. Additional disc osteophyte complex at C4-5 slightly eccentric to the right resulting in mild spinal canal stenosis. Disc osteophyte complex at C5-6 resulting in mild spinal canal stenosis. Facet arthrosis and uncovertebral hypertrophy at multiple levels. Moderate foraminal stenosis throughout the cervical spine. Foraminal stenosis most pronounced on the left at C3-4. Upper chest: No acute finding. Other: None. IMPRESSION: No CT evidence of acute intracranial abnormality. No acute fracture or traumatic malalignment of cervical spine. Soft  tissue swelling in the left parieto-occipital scalp. 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 Degenerative changes as above. Electronically Signed   By: Donnice Mania M.D.   On: 04/01/2024 15:24   CT CERVICAL SPINE WO CONTRAST Result Date: 04/01/2024 CLINICAL DATA:  Head trauma, neck trauma, fall. EXAM: CT HEAD WITHOUT CONTRAST CT CERVICAL SPINE WITHOUT CONTRAST TECHNIQUE: Multidetector CT imaging of the head and cervical spine was performed following the standard protocol without intravenous contrast. Multiplanar CT image reconstructions of the cervical spine were also generated. RADIATION DOSE REDUCTION: This exam was performed according to the departmental dose-optimization program which includes automated exposure control, adjustment of the mA and/or kV according to patient size and/or use of iterative reconstruction technique. COMPARISON:  12/18/2023. FINDINGS: CT HEAD FINDINGS Brain: No acute intracranial hemorrhage. No CT evidence of acute infarct. Redemonstrated remote infarct in the parasagittal right frontal lobe involving the right ACA territory. Additional remote infarct in the left cerebellum. Nonspecific hypoattenuation in the periventricular and subcortical white matter favored to reflect chronic microvascular ischemic changes. Mild parenchymal volume loss. No edema, mass effect, or midline shift. The basilar cisterns are patent. Ventricles: The ventricles are normal. Vascular: Atherosclerotic calcifications of the carotid siphons. No hyperdense vessel. Skull: No acute or aggressive finding. Orbits: Bilateral lens replacement. Sinuses: The visualized paranasal sinuses are clear. Other: Mastoid air cells are clear. Soft tissue swelling in the left parieto-occipital scalp. CT CERVICAL SPINE FINDINGS Alignment: Straightening of the normal cervical lordosis. No listhesis. No facet subluxation or dislocation.  Skull base and vertebrae: No acute fracture. No primary bone lesion or focal pathologic process. Soft tissues and spinal canal: No prevertebral fluid or swelling. No visible canal hematoma. Disc levels: Intervertebral disc space narrowing throughout the cervical spine. Disc osteophyte complex at C3-4 resulting in mild spinal canal stenosis. Additional disc osteophyte complex at C4-5 slightly eccentric to the right resulting in mild spinal canal stenosis. Disc osteophyte complex at C5-6 resulting in mild spinal canal stenosis. Facet arthrosis and uncovertebral hypertrophy at multiple levels. Moderate foraminal stenosis throughout the cervical spine. Foraminal stenosis most pronounced on the left at C3-4. Upper chest: No acute finding. Other: None. IMPRESSION: No CT evidence of acute intracranial abnormality. No acute fracture or traumatic malalignment of cervical spine. Soft tissue swelling in the left parieto-occipital scalp. 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 Degenerative changes as above. Electronically Signed   By: Donnice Mania M.D.   On: 04/01/2024 15:24   DG Pelvis Portable Result Date: 04/01/2024 CLINICAL DATA:  Fall. EXAM: PORTABLE PELVIS 1-2 VIEWS COMPARISON:  12/18/2023. FINDINGS: There is no evidence of pelvic fracture or diastasis. Femoral heads are seated within the acetabula. Mild degenerative changes of the bilateral hips. Sacroiliac joints and pubic symphysis are anatomically aligned. IMPRESSION: No acute osseous abnormality. Electronically Signed   By: Harrietta Sherry M.D.   On: 04/01/2024 14:40   DG Chest Port 1 View Result Date: 04/01/2024 CLINICAL DATA:  Fall. EXAM: PORTABLE CHEST 1 VIEW COMPARISON:  12/18/2023. FINDINGS: Stable cardiomegaly. Prior median sternotomy and CABG. No focal consolidation, pleural effusion, or pneumothorax. Calcified granuloma again noted at the right lateral lung  base. No acute osseous abnormality. IMPRESSION: No acute findings in the chest. Electronically Signed   By: Harrietta Sherry M.D.   On: 04/01/2024 14:40     .Laceration Repair  Date/Time: 04/01/2024 5:16 PM  Performed by: Melvenia Motto, MD Authorized by: Melvenia Motto, MD   Consent:    Consent obtained:  Verbal   Consent given by:  Patient   Risks, benefits, and alternatives were discussed: yes     Risks discussed:  Pain and need for additional repair Universal protocol:    Procedure explained and questions  answered to patient or proxy's satisfaction: yes     Patient identity confirmed:  Verbally with patient Anesthesia:    Anesthesia method:  None Laceration details:    Location:  Scalp   Scalp location:  Occipital   Length (cm):  2   Depth (mm):  5 Exploration:    Contaminated: no   Treatment:    Area cleansed with:  Saline Skin repair:    Repair method:  Sutures   Suture size:  3-0   Suture material:  Prolene   Number of sutures:  5 Approximation:    Approximation:  Close Post-procedure details:    Dressing:  Bulky dressing   Procedure completion:  Tolerated well, no immediate complications Comments:     3 figure-8 sutures and 2 simple interrupted sutures placed to obtain hemostasis.    Medications Ordered in the ED  fentaNYL  (SUBLIMAZE ) injection 50 mcg (50 mcg Intravenous Given 04/01/24 1444)  Tdap (BOOSTRIX) injection 0.5 mL (0.5 mLs Intramuscular Given 04/01/24 1509)  ondansetron  (ZOFRAN ) injection 4 mg (4 mg Intravenous Given 04/01/24 1606)  lactated ringers  bolus 500 mL (500 mLs Intravenous New Bag/Given 04/01/24 1605)                                    Medical Decision Making Amount and/or Complexity of Data Reviewed Labs: ordered. Radiology: ordered.  Risk Prescription drug management.   This patient presents to the ED for concern of fall, this involves an extensive number of treatment options, and is a complaint that carries with it a high risk of  complications and morbidity.  The differential diagnosis includes acute injuries   Co morbidities / Chronic conditions that complicate the patient evaluation  Anxiety, anemia, CAD, HTN, depression, PVD, CVA   Additional history obtained:  Additional history obtained from EMR External records from outside source obtained and reviewed including EMS, patient's son-in-law   Lab Tests:  I Ordered, and personally interpreted labs.  The pertinent results include: Acute on chronic anemia with a 1.5 g/dL drop in hemoglobin when compared to lab work from 4 days ago.  Lab work is otherwise unremarkable.   Imaging Studies ordered:  I ordered imaging studies including 2 I independently visualized and interpreted imaging which showed x-ray of chest and pelvis, CT scan of head and cervical spine I agree with the radiologist interpretation   Cardiac Monitoring: / EKG:  The patient was maintained on a cardiac monitor.  I personally viewed and interpreted the cardiac monitored which showed an underlying rhythm of: No acute findings   Problem List / ED Course / Critical interventions / Medication management  Patient presents after mechanical fall.  This occurred at home while trying to turn with her walker.  She did strike her head and she is on Plavix .  She arrives as a level 2 trauma.  On arrival, patient is alert and oriented.  She denies any areas of discomfort, including her head.  She does have a laceration to the left occipital area of her scalp.  This laceration has soaked through an ABD pad.  He does continue to ooze blood.  No pulsatile bleeding is noted.  Compression dressing was applied.  Patient has no areas of extremity deformities.  She has no chest or abdominal tenderness.  She was able to stand and ambulate with EMS.  Workup was initiated.  Despite compression dressing, patient had ongoing bleeding.  Dressing was removed.  Bleeding does not appear pulsatile but it does reflect high  flow venous bleed.  Patient underwent suturing of laceration.  5 sutures were placed to obtain hemostasis.  Dressing was placed over top and patient was taken to CT scan.  CT scan did not show any acute intracranial findings.  Lab work is notable for an acute drop in hemoglobin.  This does likely underestimate the amount of blood loss she sustained today.  Her son-in-law, who now joins her bedside, reports that she went through 6 soaked towels at home.  Patient was taken to the bathroom in a wheelchair.  On her return, she endorsed near syncopal symptoms and nausea.  Patient appears quite pale.  I am concerned of symptomatic anemia.  Plan will be for repeat CBC and possible admission for observation and transfusion as needed.  Care of patient was signed out to oncoming ED provider. I ordered medication including Tdap for tetanus prophylaxis, fentanyl  for analgesia, IV fluids for hydration, Zofran  for nausea Reevaluation of the patient after these medicines showed that the patient improved I have reviewed the patients home medicines and have made adjustments as needed   Social Determinants of Health:  Lives at home with family     Final diagnoses:  Fall, initial encounter  Laceration of scalp, initial encounter    ED Discharge Orders     None          Melvenia Motto, MD 04/01/24 1720

## 2024-04-01 NOTE — ED Notes (Signed)
 MD made aware pt bleeding through bandage. Dr. Melvenia at bedside to suture patient.

## 2024-04-01 NOTE — ED Notes (Signed)
 After getting pt up to use the restroom, pt became pale and felt near syncopal and endorses nausea. MD at the bedside.

## 2024-04-02 DIAGNOSIS — D649 Anemia, unspecified: Secondary | ICD-10-CM | POA: Diagnosis not present

## 2024-04-02 LAB — COMPREHENSIVE METABOLIC PANEL WITH GFR
ALT: 17 U/L (ref 0–44)
AST: 21 U/L (ref 15–41)
Albumin: 3 g/dL — ABNORMAL LOW (ref 3.5–5.0)
Alkaline Phosphatase: 41 U/L (ref 38–126)
Anion gap: 8 (ref 5–15)
BUN: 15 mg/dL (ref 8–23)
CO2: 26 mmol/L (ref 22–32)
Calcium: 8.8 mg/dL — ABNORMAL LOW (ref 8.9–10.3)
Chloride: 105 mmol/L (ref 98–111)
Creatinine, Ser: 0.82 mg/dL (ref 0.44–1.00)
GFR, Estimated: >60 mL/min (ref >60)
Glucose, Bld: 97 mg/dL (ref 70–99)
Potassium: 4.5 mmol/L (ref 3.5–5.1)
Sodium: 139 mmol/L (ref 135–145)
Total Bilirubin: 0.5 mg/dL (ref 0.0–1.2)
Total Protein: 5.2 g/dL — ABNORMAL LOW (ref 6.5–8.1)

## 2024-04-02 LAB — CBC
HCT: 23.7 % — ABNORMAL LOW (ref 36.0–46.0)
Hemoglobin: 7.6 g/dL — ABNORMAL LOW (ref 12.0–15.0)
MCH: 28.8 pg (ref 26.0–34.0)
MCHC: 32.1 g/dL (ref 30.0–36.0)
MCV: 89.8 fL (ref 80.0–100.0)
Platelets: 148 10*3/uL — ABNORMAL LOW (ref 150–400)
RBC: 2.64 MIL/uL — ABNORMAL LOW (ref 3.87–5.11)
RDW: 14.7 % (ref 11.5–15.5)
WBC: 6 10*3/uL (ref 4.0–10.5)
nRBC: 0 % (ref 0.0–0.2)

## 2024-04-02 LAB — PREPARE RBC (CROSSMATCH)

## 2024-04-02 MED ORDER — ACETAMINOPHEN 500 MG PO TABS
1000.0000 mg | ORAL_TABLET | Freq: Four times a day (QID) | ORAL | Status: DC
  Administered 2024-04-02 – 2024-04-03 (×3): 1000 mg via ORAL
  Filled 2024-04-02 (×4): qty 2

## 2024-04-02 MED ORDER — SODIUM CHLORIDE 0.9% IV SOLUTION: Freq: Once | INTRAVENOUS | Status: AC

## 2024-04-02 NOTE — Progress Notes (Signed)
 OT Cancellation Note  Patient Details Name: Carmen [name] MRN: [id number] DOB: November 03, 1940   Cancelled Treatment:    Reason Eval/Treat Not Completed: Other (comment) (RN asking to hold due to pt recieving blood and incr headache. Will follow up for OT eval as schedule permits)  Nakyah Erdmann K, OTD, OTR/L [facility] [phone]   Laneta MARLA Pereyra 04/02/2024, 10:47 AM

## 2024-04-02 NOTE — Evaluation (Signed)
 Physical Therapy Evaluation Patient Details Name: Carmen [name] MRN: [id number] DOB: 1941/03/21 Today's Date: 04/02/2024  History of Present Illness  Pt is an 83 y.o. F presenting to [facility] on 04/01/24 following a fall backwards walking w/ her walker. Pt sustained laceration to posterior aspect of head w/ significant blood loss. PMH is significant for hypothyroidism, OA, DDD, PVD, CVA, CAD, RA, anemia, anxiety, and A-fib on Plavix .  Clinical Impression  Prior to admittance pt was mobilizing mod I using a RW for household distances and rollator for community level distances. Pt presents to evaluation near baseline level of function. Pt was able to perform all mobility tasks at a supervision level. Pt was encouraged to continue to utilize RW and rollator for all mobilization, and to wear emergency alert necklace. Pt would benefit from balance training. PT will continue to treat pt while she is admitted. Recommending OPPT to work on balance and reduce risk of falls.         If plan is discharge home, recommend the following: A little help with walking and/or transfers;A little help with bathing/dressing/bathroom;Assistance with cooking/housework;Assist for transportation;Help with stairs or ramp for entrance   Can travel by private vehicle        Equipment Recommendations None recommended by PT  Recommendations for Other Services       Functional Status Assessment Patient has had a recent decline in their functional status and demonstrates the ability to make significant improvements in function in a reasonable and predictable amount of time.     Precautions / Restrictions Precautions Precautions: Fall Recall of Precautions/Restrictions: Intact Restrictions Weight Bearing Restrictions Per Provider Order: No      Mobility  Bed Mobility Overal bed mobility: Needs Assistance Bed Mobility: Supine to Sit     Supine to sit: Supervision, HOB elevated     General bed mobility comments:  Increased time to complete. Pt pulls herself to sit EOB.    Transfers Overall transfer level: Needs assistance Equipment used: Rolling walker (2 wheels) Transfers: Sit to/from Stand, Bed to chair/wheelchair/BSC Sit to Stand: Supervision   Step pivot transfers: Supervision       General transfer comment: STS from EOB w/ RW. Pt pushes up from surface w/ RUE and stabilizes walker w/ LUE. Increased time to complete. Pt then completed stand-step transfer to the L from EOB to recliner w/ RW. Increased time to complete    Ambulation/Gait Ambulation/Gait assistance: Supervision Gait Distance (Feet): 150 Feet Assistive device: Rolling walker (2 wheels) Gait Pattern/deviations: Step-through pattern, Decreased stride length Gait velocity: functional Gait velocity interpretation: 1.31 - 2.62 ft/sec, indicative of limited community ambulator   General Gait Details: Pt ambulates w/ reciprocal gait and increased reliance on RW for stability. Pt encouraged to reduce gait speed when navigating turns as she has a significant hx of falls due to negotiating turns quickly.  Stairs            Wheelchair Mobility     Tilt Bed    Modified Rankin (Stroke Patients Only)       Balance Overall balance assessment: Needs assistance Sitting-balance support: No upper extremity supported, Feet supported Sitting balance-[name]: Good Sitting balance - Comments: seated EOB   Standing balance support: Bilateral upper extremity supported, During functional activity, Reliant on assistive device for balance Standing balance-[name]: Poor Standing balance comment: reliant on external support  Pertinent Vitals/Pain Pain Assessment Pain Assessment: Faces Faces Pain Scale: Hurts a little bit Pain Location: head Pain Descriptors / Indicators: Grimacing, Discomfort Pain Intervention(s): Limited activity within patient's tolerance, Monitored during session     Home Living Family/patient expects to be discharged to:: Private residence Living Arrangements: Children Available Help at Discharge: Family;Available 24 hours/day Type of Home: House Home Access: Ramped entrance       Home Layout: One level Home Equipment: Agricultural consultant (2 wheels);Rollator (4 wheels);Cane - quad;BSC/3in1;Shower seat;Grab bars - toilet;Grab bars - tub/shower;Hand held shower head;Lift chair;Hospital bed Additional Comments: Lives in in-law suite at daughter's home; single-level, ramp access    Prior Function Prior Level of Function : History of Falls (last six months);Independent/Modified Independent             Mobility Comments: pt uses RW for household distances and rollator for community level distances. most recent fall pt performed a quick turn while using RW in home and hit the back of her head on the tv stand. pt has an emergency button she is encouraged to wear around her neck by her daughter. ADLs Comments: Indep with ADLs; daughter assists wth transportation and household chores as needed     Extremity/Trunk Assessment   Upper Extremity Assessment Upper Extremity Assessment: Overall WFL for tasks assessed    Lower Extremity Assessment Lower Extremity Assessment: Overall WFL for tasks assessed    Cervical / Trunk Assessment Cervical / Trunk Assessment: Kyphotic  Communication   Communication Communication: Impaired Factors Affecting Communication: Hearing impaired    Cognition Arousal: Alert Behavior During Therapy: WFL for tasks assessed/performed   PT - Cognitive impairments: No apparent impairments                         Following commands: Intact       Cueing Cueing Techniques: Verbal cues, Visual cues     General Comments General comments (skin integrity, edema, etc.): no signs of acute distress    Exercises     Assessment/Plan    PT Assessment Patient needs continued PT services  PT Problem List Decreased  balance;Decreased mobility;Decreased knowledge of use of DME       PT Treatment Interventions DME instruction;Gait training;Functional mobility training;Therapeutic activities;Therapeutic exercise;Balance training;Patient/family education;Wheelchair mobility training;Manual techniques;Modalities    PT Goals (Current goals can be found in the Care Plan section)  Acute Rehab PT Goals Patient Stated Goal: to go home PT Goal Formulation: With patient/family Time For Goal Achievement: 04/16/24 Potential to Achieve Goals: Good    Frequency Min 1X/week     Co-evaluation               AM-PAC PT 6 Clicks Mobility  Outcome Measure Help needed turning from your back to your side while in a flat bed without using bedrails?: A Little Help needed moving from lying on your back to sitting on the side of a flat bed without using bedrails?: A Little Help needed moving to and from a bed to a chair (including a wheelchair)?: A Little Help needed standing up from a chair using your arms (e.g., wheelchair or bedside chair)?: A Little Help needed to walk in hospital room?: A Little Help needed climbing 3-5 steps with a railing? : A Lot 6 Click Score: 17    End of Session Equipment Utilized During Treatment: Gait belt Activity Tolerance: Patient tolerated treatment well Patient left: in chair;with call bell/phone within reach;with chair alarm set;with family/visitor present Nurse Communication:  Mobility status PT Visit Diagnosis: History of falling (Z91.81)    Time: 8555-8487 PT Time Calculation (min) (ACUTE ONLY): 28 min   Charges:   PT Evaluation $PT Eval Low Complexity: 1 Low   PT General Charges $$ ACUTE PT VISIT: 1 Visit         Leontine Hilt, SPT Acute Rehab [phone]   Leontine Hilt 04/02/2024, 4:53 PM

## 2024-04-02 NOTE — TOC CM/SW Note (Addendum)
 Transition of Care [facility]) - Inpatient Brief Assessment   Patient Details  Name: Carmen [name] MRN: [id number] Date of Birth: 1941-08-21  Transition of Care [facility] CM/SW Contact:    Tom-Johnson, Jimi Schappert Daphne, RN Phone Number: 04/02/2024, 3:15 PM   Clinical Narrative:  Patient presented to the ED after a Fall at home, hitting her Head on a piece of Furniture with noted Laceration to her Head. Laceration was sutured. Patient's hgb noted to drop from 10.4 to 8.5 in the ED. 1U PRBC given. Today, hgb at 7.6, 1U PRBC given also. Has received 2U PRBC this admit.  Patient has hx of PVD, Hypothyroidism, Osteoarthritis, DDD, Rheumatoid Arthritis, Chronic Anemia, CVA, Anxiety and A-Fib, on ASA and Plavix .      From home with her daughter, has three supportive children. Has all necessary DME's including handicapped bathroom at home.  PCP is Corwin Antu, FNP and uses [facility] on [address] in [place].   Patient states she was active with [facility] home care, CM awaiting PT/OT eval for disposition.  Patient not Medically ready for discharge.  CM will continue to follow as patient progresses with care towards discharge.               Transition of Care Asessment: Insurance and Status: Insurance coverage has been reviewed Patient has primary care physician: Yes Home environment has been reviewed: Yes Prior level of function:: Modified independent Prior/Current Home Services: Current home services [facility]) Social Drivers of Health Review: SDOH reviewed no interventions necessary Readmission risk has been reviewed: Yes Transition of care needs: transition of care needs identified, TOC will continue to follow

## 2024-04-02 NOTE — Progress Notes (Incomplete)
New Admission Note:   Arrival Method:  Mental Orientation: Telemetry:  Assessment: Completed Skin: IV: Pain:  Tubes: Safety Measures: Safety Fall Prevention Plan has been discussed.  Admission: Completed 5MW Orientation: Patient has been oriented to the room, unit and staff.  Family:  Orders have been reviewed and implemented. Will continue to monitor the patient. Call light has been placed within reach and bed alarm has been activated.   Charito Bokiagon BSN, RN-BC Phone number: [phone] 

## 2024-04-02 NOTE — TOC CAGE-AID Note (Signed)
 Transition of Care [facility]) - CAGE-AID Screening  Patient Details  Name: Carmen [name] MRN: [id number] Date of Birth: 05-15-1941  Clinical Narrative:  Patient denies any current alcohol or drug use, substance abuse resources not provided at this time.  CAGE-AID Screening:   Have You Ever Felt You Ought to Cut Down on Your Drinking or Drug Use?: No Have People Annoyed You By Critizing Your Drinking Or Drug Use?: No Have You Felt Bad Or Guilty About Your Drinking Or Drug Use?: No Have You Ever Had a Drink or Used Drugs First Thing In The Morning to Steady Your Nerves or to Get Rid of a Hangover?: No CAGE-AID Score: 0  Substance Abuse Education Offered: No

## 2024-04-02 NOTE — Plan of Care (Signed)
   Problem: Education: Goal: Knowledge of General Education information will improve Description: Including pain rating scale, medication(s)/side effects and non-pharmacologic comfort measures Outcome: Progressing   Problem: Activity: Goal: Risk for activity intolerance will decrease Outcome: Progressing

## 2024-04-02 NOTE — Care Management Obs Status (Signed)
 MEDICARE OBSERVATION STATUS NOTIFICATION   Patient Details  Name: Carmen [name] MRN: [id number] Date of Birth: Jun 19, 1941   Medicare Observation Status Notification Given:    Verbally reviewed observation with Chuckie Garbe teleonomically at [phone] Patient refused to sign but Representee requested a copy left in patient room     Mykaila Blunck 04/02/2024, 1:13 PM

## 2024-04-02 NOTE — ED Notes (Signed)
 RN notified of pt moving up to unit.

## 2024-04-02 NOTE — Progress Notes (Signed)
 PROGRESS NOTE    Carmen [name]  FMW:[id number] DOB: [birth year] DOA: 04/01/2024 PCP: Corwin Antu, FNP    Brief Narrative:  83 year old with ambulatory dysfunction who walks with a walker, history of a stroke, coronary artery disease, hypertension, anxiety lost balance and struck her left side of the head against a piece of the furniture resulting in laceration and increased amount of bleeding through her scalp.  EMS was called.  Brought to the ER.  Laceration repaired.  Skeletal survey negative.  Dizzy and lightheaded along with drop in hemoglobin and significant headache so admitted for monitoring.  Patient on Plavix .  Subjective: Patient seen in the morning rounds.  She has not been mobilized yet.  Patient was complaining of significant pain on the left side of the head.  Without any nausea vomiting. Consented for blood transfusion.  Assessment & Plan:   Symptomatic acute blood loss anemia due to scalp laceration: Baseline hemoglobin 11.  Significant bleeding through the scalp laceration.  Hemoglobin down to 7.6.  With multiple cardiovascular history and dizziness, will treat as symptomatic anemia.  1 unit of PRBC today.  Recheck tomorrow morning.  Trauma, mechanical fall and scalp laceration: Stable.  Adequate pain medications.  Sutured in the emergency room.  Will need suture removal in about a week. Mobilize with PT OT.  May benefit with home health PT OT.  Coronary artery disease: Stable now.  Holding Plavix .  Will resume on discharge if no further bleeding.  Paroxysmal A-fib: No more on anticoagulation.  Currently sinus rhythm.  Chronic medical issues including Hypothyroidism Hyperlipidemia Rheumatoid arthritis Anxiety disorder -Resume all long-term medications including Depakote , Plaquenil , levothyroxine , losartan , metoprolol .   DVT prophylaxis: SCDs Start: 04/01/24 2030   Code Status: DNR with limited intervention Family Communication: None at the bedside Disposition  Plan: Status is: Observation The patient will require care spanning > 2 midnights and should be moved to inpatient because: Blood transfusions, pain management     Consultants:  None  Procedures:  None  Antimicrobials:  None     Objective: Vitals:   04/02/24 0408 04/02/24 0756 04/02/24 0833 04/02/24 0848  BP: (!) 116/91 136/65 121/60 (!) 123/59  Pulse: 68 76 70 67  Resp: 18 18 17 18   Temp: 98.3 F (36.8 C) 98.2 F (36.8 C) 98.3 F (36.8 C) 98.4 F (36.9 C)  TempSrc: Oral Oral Oral Oral  SpO2: 100% 98% 99% 97%  Weight: 76.7 kg     Height: 5' 1 (1.549 m)       Intake/Output Summary (Last 24 hours) at 04/02/2024 1117 Last data filed at 04/02/2024 1002 Gross per 24 hour  Intake 360 ml  Output 225 ml  Net 135 ml   Filed Weights   04/01/24 1407 04/02/24 0408  Weight: 77.1 kg 76.7 kg    Examination:  General exam: Appears calm , slightly anxious with pain. Respiratory system: Clear to auscultation. Respiratory effort normal. Cardiovascular system: S1 & S2 heard, RRR.  Gastrointestinal system: Soft and nontender.  Bowel sounds present. Central nervous system: Alert and oriented.  Does not have any focal logical deficits.  Gait is not checked. Patient has compression dressing on her scalp.  Not removed today.    Data Reviewed: I have personally reviewed following labs and imaging studies  CBC: Recent Labs  Lab 03/28/24 0845 04/01/24 1416 04/01/24 1418 04/01/24 1720 04/02/24 0526  WBC 5.5 6.7  --  7.9 6.0  HGB 11.9 10.4* 10.5* 8.3* 7.6*  HCT 38.5 32.7* 31.0* 26.9* 23.7*  MCV 91 89.8  --  91.5 89.8  PLT 229 198  --  155 148*   Basic Metabolic Panel: Recent Labs  Lab 03/28/24 0845 04/01/24 1416 04/01/24 1418 04/02/24 0526  NA 137 139 139 139  K 4.6 4.6 4.3 4.5  CL 100 104 103 105  CO2 21 25  --  26  GLUCOSE 78 128* 121* 97  BUN 16 19 19 15   CREATININE 0.84 0.88 0.90 0.82  CALCIUM  9.3 8.8*  --  8.8*  MG 1.9  --   --   --    GFR: Estimated  Creatinine Clearance: 49.6 mL/min (by C-G formula based on SCr of 0.82 mg/dL). Liver Function Tests: Recent Labs  Lab 04/01/24 1416 04/02/24 0526  AST 31 21  ALT 19 17  ALKPHOS 57 41  BILITOT 0.8 0.5  PROT 6.1* 5.2*  ALBUMIN 3.5 3.0*   No results for input(s): LIPASE, AMYLASE in the last 168 hours. No results for input(s): AMMONIA in the last 168 hours. Coagulation Profile: Recent Labs  Lab 04/01/24 1416  INR 1.1   Cardiac Enzymes: No results for input(s): CKTOTAL, CKMB, CKMBINDEX, TROPONINI in the last 168 hours. BNP (last 3 results) No results for input(s): PROBNP in the last 8760 hours. HbA1C: No results for input(s): HGBA1C in the last 72 hours. CBG: No results for input(s): GLUCAP in the last 168 hours. Lipid Profile: No results for input(s): CHOL, HDL, LDLCALC, TRIG, CHOLHDL, LDLDIRECT in the last 72 hours. Thyroid  Function Tests: No results for input(s): TSH, T4TOTAL, FREET4, T3FREE, THYROIDAB in the last 72 hours. Anemia Panel: No results for input(s): VITAMINB12, FOLATE, FERRITIN, TIBC, IRON, RETICCTPCT in the last 72 hours. Sepsis Labs: No results for input(s): PROCALCITON, LATICACIDVEN in the last 168 hours.  No results found for this or any previous visit (from the past 240 hours).       Radiology Studies: CT HEAD WO CONTRAST Result Date: 04/01/2024 CLINICAL DATA:  Head trauma, neck trauma, fall. EXAM: CT HEAD WITHOUT CONTRAST CT CERVICAL SPINE WITHOUT CONTRAST TECHNIQUE: Multidetector CT imaging of the head and cervical spine was performed following the standard protocol without intravenous contrast. Multiplanar CT image reconstructions of the cervical spine were also generated. RADIATION DOSE REDUCTION: This exam was performed according to the departmental dose-optimization program which includes automated exposure control, adjustment of the mA and/or kV according to patient size and/or use of  iterative reconstruction technique. COMPARISON:  12/18/2023. FINDINGS: CT HEAD FINDINGS Brain: No acute intracranial hemorrhage. No CT evidence of acute infarct. Redemonstrated remote infarct in the parasagittal right frontal lobe involving the right ACA territory. Additional remote infarct in the left cerebellum. Nonspecific hypoattenuation in the periventricular and subcortical white matter favored to reflect chronic microvascular ischemic changes. Mild parenchymal volume loss. No edema, mass effect, or midline shift. The basilar cisterns are patent. Ventricles: The ventricles are normal. Vascular: Atherosclerotic calcifications of the carotid siphons. No hyperdense vessel. Skull: No acute or aggressive finding. Orbits: Bilateral lens replacement. Sinuses: The visualized paranasal sinuses are clear. Other: Mastoid air cells are clear. Soft tissue swelling in the left parieto-occipital scalp. CT CERVICAL SPINE FINDINGS Alignment: Straightening of the normal cervical lordosis. No listhesis. No facet subluxation or dislocation. Skull base and vertebrae: No acute fracture. No primary bone lesion or focal pathologic process. Soft tissues and spinal canal: No prevertebral fluid or swelling. No visible canal hematoma. Disc levels: Intervertebral disc space narrowing throughout the cervical spine. Disc osteophyte complex at C3-4 resulting in mild spinal canal stenosis.  Additional disc osteophyte complex at C4-5 slightly eccentric to the right resulting in mild spinal canal stenosis. Disc osteophyte complex at C5-6 resulting in mild spinal canal stenosis. Facet arthrosis and uncovertebral hypertrophy at multiple levels. Moderate foraminal stenosis throughout the cervical spine. Foraminal stenosis most pronounced on the left at C3-4. Upper chest: No acute finding. Other: None. IMPRESSION: No CT evidence of acute intracranial abnormality. No acute fracture or traumatic malalignment of cervical spine. Soft tissue swelling in  the left parieto-occipital scalp. 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 Degenerative changes as above. Electronically Signed   By: Donnice Mania M.D.   On: 04/01/2024 15:24   CT CERVICAL SPINE WO CONTRAST Result Date: 04/01/2024 CLINICAL DATA:  Head trauma, neck trauma, fall. EXAM: CT HEAD WITHOUT CONTRAST CT CERVICAL SPINE WITHOUT CONTRAST TECHNIQUE: Multidetector CT imaging of the head and cervical spine was performed following the standard protocol without intravenous contrast. Multiplanar CT image reconstructions of the cervical spine were also generated. RADIATION DOSE REDUCTION: This exam was performed according to the departmental dose-optimization program which includes automated exposure control, adjustment of the mA and/or kV according to patient size and/or use of iterative reconstruction technique. COMPARISON:  12/18/2023. FINDINGS: CT HEAD FINDINGS Brain: No acute intracranial hemorrhage. No CT evidence of acute infarct. Redemonstrated remote infarct in the parasagittal right frontal lobe involving the right ACA territory. Additional remote infarct in the left cerebellum. Nonspecific hypoattenuation in the periventricular and subcortical white matter favored to reflect chronic microvascular ischemic changes. Mild parenchymal volume loss. No edema, mass effect, or midline shift. The basilar cisterns are patent. Ventricles: The ventricles are normal. Vascular: Atherosclerotic calcifications of the carotid siphons. No hyperdense vessel. Skull: No acute or aggressive finding. Orbits: Bilateral lens replacement. Sinuses: The visualized paranasal sinuses are clear. Other: Mastoid air cells are clear. Soft tissue swelling in the left parieto-occipital scalp. CT CERVICAL SPINE FINDINGS Alignment: Straightening of the normal cervical lordosis. No listhesis. No facet subluxation or dislocation. Skull base and  vertebrae: No acute fracture. No primary bone lesion or focal pathologic process. Soft tissues and spinal canal: No prevertebral fluid or swelling. No visible canal hematoma. Disc levels: Intervertebral disc space narrowing throughout the cervical spine. Disc osteophyte complex at C3-4 resulting in mild spinal canal stenosis. Additional disc osteophyte complex at C4-5 slightly eccentric to the right resulting in mild spinal canal stenosis. Disc osteophyte complex at C5-6 resulting in mild spinal canal stenosis. Facet arthrosis and uncovertebral hypertrophy at multiple levels. Moderate foraminal stenosis throughout the cervical spine. Foraminal stenosis most pronounced on the left at C3-4. Upper chest: No acute finding. Other: None. IMPRESSION: No CT evidence of acute intracranial abnormality. No acute fracture or traumatic malalignment of cervical spine. Soft tissue swelling in the left parieto-occipital scalp. 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 Degenerative changes as above. Electronically Signed   By: Donnice Mania M.D.   On: 04/01/2024 15:24   DG Pelvis Portable Result Date: 04/01/2024 CLINICAL DATA:  Fall. EXAM: PORTABLE PELVIS 1-2 VIEWS COMPARISON:  12/18/2023. FINDINGS: There is no evidence of pelvic fracture or diastasis. Femoral heads are seated within the acetabula. Mild degenerative changes of the bilateral hips. Sacroiliac joints and pubic symphysis are anatomically aligned. IMPRESSION: No acute osseous abnormality. Electronically Signed   By: Harrietta Sherry M.D.   On: 04/01/2024 14:40   DG Chest [facility] 1 View Result  Date: 04/01/2024 CLINICAL DATA:  Fall. EXAM: PORTABLE CHEST 1 VIEW COMPARISON:  12/18/2023. FINDINGS: Stable cardiomegaly. Prior median sternotomy and CABG. No focal consolidation, pleural effusion, or pneumothorax. Calcified granuloma again noted at the right lateral lung base. No acute  osseous abnormality. IMPRESSION: No acute findings in the chest. Electronically Signed   By: Harrietta Sherry M.D.   On: 04/01/2024 14:40        Scheduled Meds:  acetaminophen   1,000 mg Oral Q6H   atorvastatin   40 mg Oral QHS   divalproex   500 mg Oral QHS   hydroxychloroquine   200 mg Oral BID   levothyroxine   137 mcg Oral QAC breakfast   losartan   25 mg Oral Daily   magnesium  oxide  400 mg Oral Daily   metoprolol  succinate  50 mg Oral Daily   pantoprazole   40 mg Oral BID   cyanocobalamin   500 mcg Oral Daily   Continuous Infusions:   LOS: 0 days    Time spent: 52 minutes    Renato Applebaum, MD [facility]

## 2024-04-03 DIAGNOSIS — D649 Anemia, unspecified: Secondary | ICD-10-CM | POA: Diagnosis not present

## 2024-04-03 LAB — CBC WITH DIFFERENTIAL/PLATELET
Abs Immature Granulocytes: 0.03 10*3/uL (ref 0.00–0.07)
Basophils Absolute: 0 10*3/uL (ref 0.0–0.1)
Basophils Relative: 0 %
Eosinophils Absolute: 0.2 10*3/uL (ref 0.0–0.5)
Eosinophils Relative: 4 %
HCT: 26.1 % — ABNORMAL LOW (ref 36.0–46.0)
Hemoglobin: 8.4 g/dL — ABNORMAL LOW (ref 12.0–15.0)
Immature Granulocytes: 1 %
Lymphocytes Relative: 20 %
Lymphs Abs: 1.1 10*3/uL (ref 0.7–4.0)
MCH: 28.4 pg (ref 26.0–34.0)
MCHC: 32.2 g/dL (ref 30.0–36.0)
MCV: 88.2 fL (ref 80.0–100.0)
Monocytes Absolute: 0.7 10*3/uL (ref 0.1–1.0)
Monocytes Relative: 14 %
Neutro Abs: 3.2 10*3/uL (ref 1.7–7.7)
Neutrophils Relative %: 61 %
Platelets: 139 10*3/uL — ABNORMAL LOW (ref 150–400)
RBC: 2.96 MIL/uL — ABNORMAL LOW (ref 3.87–5.11)
RDW: 15.3 % (ref 11.5–15.5)
WBC: 5.2 10*3/uL (ref 4.0–10.5)
nRBC: 0 % (ref 0.0–0.2)

## 2024-04-03 LAB — TYPE AND SCREEN
ABO/RH(D): A POS
Antibody Screen: NEGATIVE
Unit division: 0

## 2024-04-03 LAB — BPAM RBC
Blood Product Expiration Date: 202508022359
ISSUE DATE / TIME: 202507020835
Unit Type and Rh: 6200

## 2024-04-03 NOTE — Discharge Summary (Signed)
 Physician Discharge Summary  Carmen [name] FMW:[id number] DOB: 1941-08-13 DOA: 04/01/2024  PCP: Corwin Antu, FNP  Admit date: 04/01/2024 Discharge date: 04/03/2024  Admitted From: Home Disposition: Home with home health  Recommendations for Outpatient Follow-up:  Follow up with PCP in 1-2 weeks Please obtain BMP/CBC in one week Suture removal in 1 week  Home Health: PT/RN Equipment/Devices: Available at home  Discharge Condition: Stable CODE STATUS: DNR with limited intervention Diet recommendation: Regular diet  Discharge summary: Patient with multiple issues and ambulatory dysfunction, she was navigating her walker however lost balance and hit her head with her left side of the head bumping into a furniture causing large laceration and profuse bleeding.  Brought to the ER.  Sutured.  Skeletal survey negative.  Significant blood loss anemia with hemoglobin drop from 11-7.6.  Was given 1 unit of PRBC.  Stabilized.  Able to go back home with family support, home physical therapy.  Sutures are intact.  No evidence of further bleeding.  She will need suture removal in 1 week. Resume Plavix  tomorrow.    Discharge Diagnoses:  Principal Problem:   Symptomatic anemia Active Problems:   DDD (degenerative disc disease), cervical   Paroxysmal atrial fibrillation (HCC)   Essential hypertension   Rheumatoid arthritis (HCC)   Peripheral vascular disease (HCC)   Hyperlipidemia LDL goal <70   Hypothyroidism   Chronic pain syndrome   Depression, unspecified   DNR (do not resuscitate)    Discharge Instructions  Discharge Instructions     Diet - low sodium heart healthy   Complete by: As directed    Discharge instructions   Complete by: As directed    Call and schedule follow up with your primary care doctor for removal of sutures in one week   Increase activity slowly   Complete by: As directed    No dressing needed   Complete by: As directed       Allergies as of 04/03/2024    No Known Allergies      Medication List     TAKE these medications    aspirin  EC 81 MG tablet Take 81 mg by mouth daily. Swallow whole.   atorvastatin  40 MG tablet Commonly known as: LIPITOR Take 1 tablet (40 mg total) by mouth at bedtime.   Biofreeze Roll-On 4 % Gel Generic drug: Menthol (Topical Analgesic) Apply 1 Application topically 3 (three) times daily.   CALCIUM  PO Take 1 tablet by mouth daily.   clopidogrel  75 MG tablet Commonly known as: Plavix  Take 1 tablet (75 mg total) by mouth daily.   cyanocobalamin  500 MCG tablet Commonly known as: VITAMIN B12 Take 500 mcg by mouth daily.   diphenhydrAMINE 25 MG tablet Commonly known as: BENADRYL Take 25 mg by mouth at bedtime as needed for allergies.   divalproex  500 MG DR tablet Commonly known as: DEPAKOTE  Take 1 tablet (500 mg total) by mouth at bedtime.   EQ All Day Allergy Relief 10 MG tablet Generic drug: loratadine  Take 1 tablet by mouth once daily   furosemide  20 MG tablet Commonly known as: LASIX  Take 1 tablet (20 mg total) by mouth daily as needed (for swelling).   HYDROcodone -acetaminophen  5-325 MG tablet Commonly known as: NORCO/VICODIN Take 1 tablet by mouth 2 (two) times daily as needed for severe pain (pain score 7-10). Must last 30 days.   HYDROcodone -acetaminophen  5-325 MG tablet Commonly known as: NORCO/VICODIN Take 1 tablet by mouth 2 (two) times daily as needed for severe pain (pain score 7-10).  Must last 30 days.   HYDROcodone -acetaminophen  5-325 MG tablet Commonly known as: NORCO/VICODIN Take 1 tablet by mouth 2 (two) times daily as needed for severe pain (pain score 7-10). Must last 30 days.   hydroxychloroquine  200 MG tablet Commonly known as: PLAQUENIL  Take 200 mg by mouth 2 (two) times daily.   levothyroxine  137 MCG tablet Commonly known as: SYNTHROID  Take 1 tablet (137 mcg total) by mouth daily before breakfast.   losartan  25 MG tablet Commonly known as: COZAAR  Take 1/2  (one-half) tablet by mouth once daily   magnesium  oxide 400 (240 Mg) MG tablet Commonly known as: MAG-OX Take 400 mg by mouth daily.   metoprolol  succinate 50 MG 24 hr tablet Commonly known as: TOPROL -XL Take 50 mg by mouth daily. Take with or immediately following a meal. What changed: Another medication with the same name was removed. Continue taking this medication, and follow the directions you see here.   naloxone  4 MG/0.1ML Liqd nasal spray kit Commonly known as: NARCAN  Place 1 spray into the nose as needed for up to 365 doses (for opioid-induced respiratory depresssion). In case of emergency (overdose), spray once into each nostril. If no response within 3 minutes, repeat application and call 911.   pantoprazole  40 MG tablet Commonly known as: PROTONIX  TAKE 1 TABLET BY MOUTH TWICE A DAY   Pepcid AC 10 MG tablet Generic drug: famotidine Take 10 mg by mouth daily as needed for heartburn or indigestion.   polyethylene glycol 17 g packet Commonly known as: MIRALAX  / GLYCOLAX  Take 17 g by mouth daily as needed. What changed: reasons to take this   traZODone  100 MG tablet Commonly known as: DESYREL  TAKE 1 TABLET BY MOUTH AT BEDTIME AS NEEDED FOR SLEEP   Vitamin D -3 25 MCG (1000 UT) Caps Take 1,000 Units by mouth daily.               Discharge Care Instructions  (From admission, onward)           Start     Ordered   04/03/24 0000  No dressing needed        04/03/24 1019            Follow-up Information     [facility] Follow up.   Specialty: Home Health Services Why: Someone will call you to schedule first home visit. Contact information: [address] [place] [address] [phone]         Corwin Antu, FNP. Schedule an appointment as soon as possible for a visit in 1 week(s).   Specialty: Family Medicine Contact information: [address] [place] [address] [phone]                 No Known Allergies  Consultations: None   Procedures/Studies: CT HEAD WO CONTRAST Result Date: 04/01/2024 CLINICAL DATA:  Head trauma, neck trauma, fall. EXAM: CT HEAD WITHOUT CONTRAST CT CERVICAL SPINE WITHOUT CONTRAST TECHNIQUE: Multidetector CT imaging of the head and cervical spine was performed following the standard protocol without intravenous contrast. Multiplanar CT image reconstructions of the cervical spine were also generated. RADIATION DOSE REDUCTION: This exam was performed according to the departmental dose-optimization program which includes automated exposure control, adjustment of the mA and/or kV according to patient size and/or use of iterative reconstruction technique. COMPARISON:  12/18/2023. FINDINGS: CT HEAD FINDINGS Brain: No acute intracranial hemorrhage. No CT evidence of acute infarct. Redemonstrated remote infarct in the parasagittal  right frontal lobe involving the right ACA territory. Additional remote infarct in the left cerebellum. Nonspecific hypoattenuation in the periventricular and subcortical white matter favored to reflect chronic microvascular ischemic changes. Mild parenchymal volume loss. No edema, mass effect, or midline shift. The basilar cisterns are patent. Ventricles: The ventricles are normal. Vascular: Atherosclerotic calcifications of the carotid siphons. No hyperdense vessel. Skull: No acute or aggressive finding. Orbits: Bilateral lens replacement. Sinuses: The visualized paranasal sinuses are clear. Other: Mastoid air cells are clear. Soft tissue swelling in the left parieto-occipital scalp. CT CERVICAL SPINE FINDINGS Alignment: Straightening of the normal cervical lordosis. No listhesis. No facet subluxation or dislocation. Skull base and vertebrae: No acute fracture. No primary bone lesion or focal pathologic process. Soft tissues and spinal canal: No prevertebral fluid or swelling. No visible canal hematoma. Disc levels: Intervertebral disc  space narrowing throughout the cervical spine. Disc osteophyte complex at C3-4 resulting in mild spinal canal stenosis. Additional disc osteophyte complex at C4-5 slightly eccentric to the right resulting in mild spinal canal stenosis. Disc osteophyte complex at C5-6 resulting in mild spinal canal stenosis. Facet arthrosis and uncovertebral hypertrophy at multiple levels. Moderate foraminal stenosis throughout the cervical spine. Foraminal stenosis most pronounced on the left at C3-4. Upper chest: No acute finding. Other: None. IMPRESSION: No CT evidence of acute intracranial abnormality. No acute fracture or traumatic malalignment of cervical spine. Soft tissue swelling in the left parieto-occipital scalp. 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 Degenerative changes as above. Electronically Signed   By: Donnice Mania M.D.   On: 04/01/2024 15:24   CT CERVICAL SPINE WO CONTRAST Result Date: 04/01/2024 CLINICAL DATA:  Head trauma, neck trauma, fall. EXAM: CT HEAD WITHOUT CONTRAST CT CERVICAL SPINE WITHOUT CONTRAST TECHNIQUE: Multidetector CT imaging of the head and cervical spine was performed following the standard protocol without intravenous contrast. Multiplanar CT image reconstructions of the cervical spine were also generated. RADIATION DOSE REDUCTION: This exam was performed according to the departmental dose-optimization program which includes automated exposure control, adjustment of the mA and/or kV according to patient size and/or use of iterative reconstruction technique. COMPARISON:  12/18/2023. FINDINGS: CT HEAD FINDINGS Brain: No acute intracranial hemorrhage. No CT evidence of acute infarct. Redemonstrated remote infarct in the parasagittal right frontal lobe involving the right ACA territory. Additional remote infarct in the left cerebellum. Nonspecific hypoattenuation in the periventricular and  subcortical white matter favored to reflect chronic microvascular ischemic changes. Mild parenchymal volume loss. No edema, mass effect, or midline shift. The basilar cisterns are patent. Ventricles: The ventricles are normal. Vascular: Atherosclerotic calcifications of the carotid siphons. No hyperdense vessel. Skull: No acute or aggressive finding. Orbits: Bilateral lens replacement. Sinuses: The visualized paranasal sinuses are clear. Other: Mastoid air cells are clear. Soft tissue swelling in the left parieto-occipital scalp. CT CERVICAL SPINE FINDINGS Alignment: Straightening of the normal cervical lordosis. No listhesis. No facet subluxation or dislocation. Skull base and vertebrae: No acute fracture. No primary bone lesion or focal pathologic process. Soft tissues and spinal canal: No prevertebral fluid or swelling. No visible canal hematoma. Disc levels: Intervertebral disc space narrowing throughout the cervical spine. Disc osteophyte complex at C3-4 resulting in mild spinal canal stenosis. Additional disc osteophyte complex at C4-5 slightly eccentric to the right resulting in mild spinal canal stenosis. Disc osteophyte complex at C5-6 resulting in mild spinal canal stenosis. Facet arthrosis and uncovertebral hypertrophy at multiple levels. Moderate foraminal stenosis  throughout the cervical spine. Foraminal stenosis most pronounced on the left at C3-4. Upper chest: No acute finding. Other: None. IMPRESSION: No CT evidence of acute intracranial abnormality. No acute fracture or traumatic malalignment of cervical spine. Soft tissue swelling in the left parieto-occipital scalp. 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 Degenerative changes as above. Electronically Signed   By: Donnice Mania M.D.   On: 04/01/2024 15:24   DG Pelvis Portable Result Date: 04/01/2024 CLINICAL DATA:  Fall. EXAM: PORTABLE PELVIS 1-2  VIEWS COMPARISON:  12/18/2023. FINDINGS: There is no evidence of pelvic fracture or diastasis. Femoral heads are seated within the acetabula. Mild degenerative changes of the bilateral hips. Sacroiliac joints and pubic symphysis are anatomically aligned. IMPRESSION: No acute osseous abnormality. Electronically Signed   By: Harrietta Sherry M.D.   On: 04/01/2024 14:40   DG Chest Port 1 View Result Date: 04/01/2024 CLINICAL DATA:  Fall. EXAM: PORTABLE CHEST 1 VIEW COMPARISON:  12/18/2023. FINDINGS: Stable cardiomegaly. Prior median sternotomy and CABG. No focal consolidation, pleural effusion, or pneumothorax. Calcified granuloma again noted at the right lateral lung base. No acute osseous abnormality. IMPRESSION: No acute findings in the chest. Electronically Signed   By: Harrietta Sherry M.D.   On: 04/01/2024 14:40   (Echo, Carotid, EGD, Colonoscopy, ERCP)    Subjective: Patient seen in the morning rounds.  Today denies any pain swelling or discomfort.  Eager to go home.   Discharge Exam: Vitals:   04/03/24 0411 04/03/24 0719  BP: (!) 168/63 (!) 178/87  Pulse: 60 70  Resp: 18 18  Temp: 98 F (36.7 C) 98.1 F (36.7 C)  SpO2: 96% 98%   Vitals:   04/02/24 1622 04/02/24 2116 04/03/24 0411 04/03/24 0719  BP: (!) 134/103 (!) 158/65 (!) 168/63 (!) 178/87  Pulse: 66 61 60 70  Resp: 18 18 18 18   Temp: 98.1 F (36.7 C) 98.4 F (36.9 C) 98 F (36.7 C) 98.1 F (36.7 C)  TempSrc: Oral     SpO2: 99% 98% 96% 98%  Weight:      Height:        General: Pt is alert, awake, not in acute distress Frail.  Age-appropriate. Patient has 5 sutures placed on the left occipital area.  Clean and intact. Cardiovascular: RRR, S1/S2 +, no rubs, no gallops Respiratory: CTA bilaterally, no wheezing, no rhonchi Abdominal: Soft, NT, ND, bowel sounds + Extremities: no edema, no cyanosis    The results of significant diagnostics from this hospitalization (including imaging, microbiology, ancillary and  laboratory) are listed below for reference.     Microbiology: No results found for this or any previous visit (from the past 240 hours).   Labs: BNP (last 3 results) No results for input(s): BNP in the last 8760 hours. Basic Metabolic Panel: Recent Labs  Lab 03/28/24 0845 04/01/24 1416 04/01/24 1418 04/02/24 0526  NA 137 139 139 139  K 4.6 4.6 4.3 4.5  CL 100 104 103 105  CO2 21 25  --  26  GLUCOSE 78 128* 121* 97  BUN 16 19 19 15   CREATININE 0.84 0.88 0.90 0.82  CALCIUM  9.3 8.8*  --  8.8*  MG 1.9  --   --   --    Liver Function Tests: Recent Labs  Lab 04/01/24 1416 04/02/24 0526  AST 31 21  ALT 19 17  ALKPHOS 57 41  BILITOT 0.8 0.5  PROT 6.1* 5.2*  ALBUMIN 3.5 3.0*   No results for input(s): LIPASE, AMYLASE in the last 168 hours. No results for input(s): AMMONIA in the last 168 hours. CBC: Recent Labs  Lab 03/28/24 0845 04/01/24 1416 04/01/24 1418 04/01/24 1720 04/02/24 0526 04/03/24 0455  WBC 5.5 6.7  --  7.9 6.0 5.2  NEUTROABS  --   --   --   --   --  3.2  HGB 11.9 10.4* 10.5* 8.3* 7.6* 8.4*  HCT 38.5 32.7* 31.0* 26.9* 23.7* 26.1*  MCV 91 89.8  --  91.5 89.8 88.2  PLT 229 198  --  155 148* 139*   Cardiac Enzymes: No results for input(s): CKTOTAL, CKMB, CKMBINDEX, TROPONINI in the last 168 hours. BNP: Invalid input(s): POCBNP CBG: No results for input(s): GLUCAP in the last 168 hours. D-Dimer No results for input(s): DDIMER in the last 72 hours. Hgb A1c No results for input(s): HGBA1C in the last 72 hours. Lipid Profile No results for input(s): CHOL, HDL, LDLCALC, TRIG, CHOLHDL, LDLDIRECT in the last 72 hours. Thyroid  function studies No results for input(s): TSH, T4TOTAL, T3FREE, THYROIDAB in the last 72 hours.  Invalid input(s): FREET3 Anemia work up No results for input(s): VITAMINB12, FOLATE, FERRITIN, TIBC, IRON, RETICCTPCT in the last 72 hours. Urinalysis    Component Value  Date/Time   COLORURINE AMBER (A) 04/01/2024 1559   APPEARANCEUR HAZY (A) 04/01/2024 1559   LABSPEC 1.029 04/01/2024 1559   PHURINE 6.0 04/01/2024 1559   GLUCOSEU NEGATIVE 04/01/2024 1559   HGBUR NEGATIVE 04/01/2024 1559   BILIRUBINUR NEGATIVE 04/01/2024 1559   BILIRUBINUR negative 08/14/2023 1244   BILIRUBINUR Positive 05/17/2022 0948   KETONESUR 5 (A) 04/01/2024 1559   PROTEINUR 30 (A) 04/01/2024 1559   UROBILINOGEN 0.2 08/14/2023 1244   NITRITE NEGATIVE 04/01/2024 1559   LEUKOCYTESUR NEGATIVE 04/01/2024 1559   Sepsis Labs Recent Labs  Lab 04/01/24 1416 04/01/24 1720 04/02/24 0526 04/03/24 0455  WBC 6.7 7.9 6.0 5.2   Microbiology No results found for this or any previous visit (from the past 240 hours).   Time coordinating discharge: 32 minutes  SIGNED:   Renato Applebaum, MD  [facility] 04/03/2024, 10:19 AM

## 2024-04-03 NOTE — Evaluation (Signed)
 Occupational Therapy Evaluation and Discharge Patient Details Name: Carmen [name] MRN: [id number] DOB: 1941-08-23 Today's Date: 04/03/2024   History of Present Illness   Pt is an 83 y.o. F [facility] following a fall backwards walking w/ her walker. Pt sustained laceration to posterior aspect of head w/ significant blood loss. PMH is significant for hypothyroidism, OA, DDD, PVD, CVA, CAD, RA, anemia, anxiety, and A-fib on Plavix .     Clinical Impressions Pt lives in an in-law suite with her daughter and son in Social worker. She walks with a RW inside and rollator outside her home. She is typically modified independent in self care and assisted for IADLs. Pt is currently functioning at a set up to supervision level with RW in ADLs. Educated in fall prevention and reinforced use of medical alert system R home. Pt has all necessary DME in her home. No further OT needs.      If plan is discharge home, recommend the following:   Assistance with cooking/housework;A little help with bathing/dressing/bathroom;Direct supervision/assist for medications management;Direct supervision/assist for financial management;Assist for transportation;Help with stairs or ramp for entrance     Functional Status Assessment   Patient has had a recent decline in their functional status and demonstrates the ability to make significant improvements in function in a reasonable and predictable amount of time.     Equipment Recommendations   None recommended by OT     Recommendations for Other Services         Precautions/Restrictions   Precautions Precautions: Fall Restrictions Weight Bearing Restrictions Per Provider Order: No     Mobility Bed Mobility               General bed mobility comments: seated at EOB at beginning and end of session    Transfers Overall transfer level: Needs assistance Equipment used: Rolling walker (2 wheels) Transfers: Sit to/from Stand, Bed to  chair/wheelchair/BSC Sit to Stand: Supervision                  Balance Overall balance assessment: Needs assistance   Sitting balance-[name]: Good Sitting balance - Comments: seated EOB     Standing balance-[name]: Poor Standing balance comment: reliant on RW                           ADL either performed or assessed with clinical judgement   ADL Overall ADL's : Needs assistance/impaired Eating/Feeding: Independent   Grooming: Set up;Oral care;Sitting   Upper Body Bathing: Set up;Sitting   Lower Body Bathing: Supervison/ safety;Sit to/from stand   Upper Body Dressing : Set up;Sitting   Lower Body Dressing: Supervision/safety;Sit to/from stand   Toilet Transfer: Supervision/safety;Ambulation;BSC/3in1;Rolling walker (2 wheels)   Toileting- Clothing Manipulation and Hygiene: Supervision/safety;Sit to/from stand       Functional mobility during ADLs: Supervision/safety;Rolling walker (2 wheels)       Vision Baseline Vision/History: 1 Wears glasses Ability to See in Adequate Light: 0 Adequate Patient Visual Report: No change from baseline       Perception         Praxis         Pertinent Vitals/Pain Pain Assessment Pain Assessment: No/denies pain     Extremity/Trunk Assessment Upper Extremity Assessment Upper Extremity Assessment: Generalized weakness   Lower Extremity Assessment Lower Extremity Assessment: Defer to PT evaluation   Cervical / Trunk Assessment Cervical / Trunk Assessment: Kyphotic   Communication Communication Communication: Impaired Factors Affecting  Communication: Hearing impaired   Cognition Arousal: Alert Behavior During Therapy: WFL for tasks assessed/performed Cognition: History of cognitive impairments             OT - Cognition Comments: pt reports short term memory deficits                 Following commands: Intact       Cueing  General Comments   Cueing Techniques: Verbal  cues      Exercises     Shoulder Instructions      Home Living Family/patient expects to be discharged to:: Private residence Living Arrangements: Children Available Help at Discharge: Family;Available 24 hours/day Type of Home: House Home Access: Ramped entrance     Home Layout: One level     Bathroom Shower/Tub: Producer, television/film/video: Handicapped height     Home Equipment: Agricultural consultant (2 wheels);Rollator (4 wheels);Cane - quad;BSC/3in1;Shower seat;Grab bars - toilet;Grab bars - tub/shower;Hand held shower head;Lift chair;Hospital bed   Additional Comments: Lives in in-law suite at daughter's home; single-level, ramp access      Prior Functioning/Environment Prior Level of Function : Needs assist;History of Falls (last six months)             Mobility Comments: pt uses RW for household distances and rollator for community level distances. most recent fall pt performed a quick turn while using RW in home and hit the back of her head on the tv stand. pt has an emergency button she is encouraged to wear around her neck by her daughter. ADLs Comments: Indep with ADLs; daughter assists wth transportation and household chores as needed    OT Problem List:     OT Treatment/Interventions:        OT Goals(Current goals can be found in the care plan section)       OT Frequency:       Co-evaluation              AM-PAC OT 6 Clicks Daily Activity     Outcome Measure Help from another person eating meals?: None Help from another person taking care of personal grooming?: A Little Help from another person toileting, which includes using toliet, bedpan, or urinal?: A Little Help from another person bathing (including washing, rinsing, drying)?: A Little Help from another person to put on and taking off regular upper body clothing?: None Help from another person to put on and taking off regular lower body clothing?: A Little 6 Click Score: 20    End of Session Equipment Utilized During Treatment: Rolling walker (2 wheels);Gait belt  Activity Tolerance: Patient tolerated treatment well Patient left: in bed;with call bell/phone within reach;with bed alarm set  OT Visit Diagnosis: Unsteadiness on feet (R26.81)                Time: 8984-8963 OT Time Calculation (min): 21 min Charges:  OT General Charges $OT Visit: 1 Visit OT Evaluation $OT Eval Low Complexity: 1 Low  Mliss HERO, OTR/L Acute Rehabilitation Services Office: [phone]   Kennth Mliss Helling 04/03/2024, 11:21 AM

## 2024-04-03 NOTE — Progress Notes (Signed)
 Patient's daughter and son have been made aware of patient discharge and that the patient will go the discharge lounge. Patient provided discharge instruction and verbalizes understanding.

## 2024-04-03 NOTE — Care Management Obs Status (Signed)
 MEDICARE OBSERVATION STATUS NOTIFICATION   Patient Details  Name: Carmen [name] MRN: [id number] Date of Birth: [birth year]   Medicare Observation Status Notification Given:  Yes  Verbally reviewed observation with Chuckie Garbe teleonomically at [phone] Patient refused to sign but Representee requested a copy left in patient room      Salomon Ganser 04/03/2024, 8:21 AM

## 2024-04-03 NOTE — TOC Progression Note (Signed)
 Transition of Care [facility]) - Progression Note    Patient Details  Name: Carmen [name] MRN: [id number] Date of Birth: [birth year]  Transition of Care [facility]) CM/SW Contact  Tom-Johnson, Johnanthony Wilden Daphne, RN Phone Number: 04/03/2024, 9:26 AM  Clinical Narrative:     CM spoke with patient at bedside and daughter, [name] via phone about Outpatient PT recommendation. Patient requests doing home health PT. [name] states patient will do better with PT at home rather than outpatient. Requests to resume care with [facility]. MD notified.  CM called in referral to [facility] with acceptance voiced, info on AVS.   Patient not Medically ready for discharge.  CM will continue to follow as patient progresses with care towards discharge.            Expected Discharge Plan and Services                                               Social Determinants of Health (SDOH) Interventions SDOH Screenings   Food Insecurity: No Food Insecurity (04/02/2024)  Housing: Low Risk  (04/02/2024)  Transportation Needs: No Transportation Needs (04/02/2024)  Utilities: Not At Risk (04/02/2024)  Alcohol Screen: Low Risk  (11/18/2020)  Depression (PHQ2-9): Medium Risk (12/31/2023)  Financial Resource Strain: Low Risk  (08/20/2023)  Physical Activity: Insufficiently Active (08/20/2023)  Social Connections: Moderately Isolated (04/02/2024)  Stress: No Stress Concern Present (08/20/2023)  Tobacco Use: Medium Risk (04/01/2024)  Health Literacy: Patient Unable To Answer (08/20/2023)    Readmission Risk Interventions     No data to display

## 2024-04-03 NOTE — TOC Transition Note (Signed)
 Transition of Care [facility]) - Discharge Note   Patient Details  Name: Carmen [name] MRN: [id number] Date of Birth: Aug 07, 1941  Transition of Care [facility]) CM/SW Contact:  Tom-Johnson, Harvest Muskrat, RN Phone Number: 04/03/2024, 10:47 AM   Clinical Narrative:     Patient is scheduled for discharge today.  Home health info, hospital f/u and discharge instructions on AVS. Daughter, [name] to transport at discharge.  No further TOC needs noted.      Final next level of care: Home w Home Health Services Barriers to Discharge: Barriers Resolved   Patient Goals and CMS Choice Patient states their goals for this hospitalization and ongoing recovery are:: To return home CMS Medicare.gov Compare Post Acute Care list provided to:: Patient Choice offered to / list presented to : Patient, Adult Children (Daughter, [name])      Discharge Placement                Patient to be transferred to facility by: Daughter Name of family member notified: [name]    Discharge Plan and Services Additional resources added to the After Visit Summary for                  DME Arranged: N/A DME Agency: NA       HH Arranged: PT, Disease Management, RN HH Agency: [facility] Date HH Agency Contacted: 04/03/24 Time HH Agency Contacted: [phone] Representative spoke with at [facility] Agency: Arna  Social Drivers of Health (SDOH) Interventions SDOH Screenings   Food Insecurity: No Food Insecurity (04/02/2024)  Housing: Low Risk  (04/02/2024)  Transportation Needs: No Transportation Needs (04/02/2024)  Utilities: Not At Risk (04/02/2024)  Alcohol Screen: Low Risk  (11/18/2020)  Depression (PHQ2-9): Medium Risk (12/31/2023)  Financial Resource Strain: Low Risk  (08/20/2023)  Physical Activity: Insufficiently Active (08/20/2023)  Social Connections: Moderately Isolated (04/02/2024)  Stress: No Stress Concern Present (08/20/2023)  Tobacco Use: Medium Risk (04/01/2024)  Health Literacy: Patient Unable  To Answer (08/20/2023)     Readmission Risk Interventions     No data to display

## 2024-04-03 NOTE — Progress Notes (Signed)
 PATIENT DOES NOT HAVE A LEGAL GUARDIAN

## 2024-04-03 NOTE — Plan of Care (Signed)
  Problem: Clinical Measurements: Goal: Respiratory complications will improve Outcome: Progressing   Problem: Nutrition: Goal: Adequate nutrition will be maintained Outcome: Progressing   Problem: Pain Managment: Goal: General experience of comfort will improve and/or be controlled Outcome: Progressing

## 2024-04-07 ENCOUNTER — Ambulatory Visit (HOSPITAL_BASED_OUTPATIENT_CLINIC_OR_DEPARTMENT_OTHER): Admitting: Nurse Practitioner

## 2024-04-07 DIAGNOSIS — M47816 Spondylosis without myelopathy or radiculopathy, lumbar region: Secondary | ICD-10-CM

## 2024-04-07 DIAGNOSIS — G894 Chronic pain syndrome: Secondary | ICD-10-CM

## 2024-04-07 DIAGNOSIS — Z79891 Long term (current) use of opiate analgesic: Secondary | ICD-10-CM

## 2024-04-07 DIAGNOSIS — Z91199 Patient's noncompliance with other medical treatment and regimen due to unspecified reason: Secondary | ICD-10-CM

## 2024-04-07 DIAGNOSIS — G8929 Other chronic pain: Secondary | ICD-10-CM

## 2024-04-07 DIAGNOSIS — M545 Low back pain, unspecified: Secondary | ICD-10-CM

## 2024-04-07 DIAGNOSIS — Z79899 Other long term (current) drug therapy: Secondary | ICD-10-CM

## 2024-04-07 NOTE — Progress Notes (Signed)
 04/07/2024-No show

## 2024-04-08 DIAGNOSIS — E039 Hypothyroidism, unspecified: Secondary | ICD-10-CM | POA: Diagnosis not present

## 2024-04-08 DIAGNOSIS — M1990 Unspecified osteoarthritis, unspecified site: Secondary | ICD-10-CM | POA: Diagnosis not present

## 2024-04-08 DIAGNOSIS — H9193 Unspecified hearing loss, bilateral: Secondary | ICD-10-CM | POA: Diagnosis not present

## 2024-04-08 DIAGNOSIS — M4316 Spondylolisthesis, lumbar region: Secondary | ICD-10-CM | POA: Diagnosis not present

## 2024-04-08 DIAGNOSIS — G4700 Insomnia, unspecified: Secondary | ICD-10-CM | POA: Diagnosis not present

## 2024-04-08 DIAGNOSIS — E66812 Obesity, class 2: Secondary | ICD-10-CM | POA: Diagnosis not present

## 2024-04-08 DIAGNOSIS — G894 Chronic pain syndrome: Secondary | ICD-10-CM | POA: Diagnosis not present

## 2024-04-08 DIAGNOSIS — R1319 Other dysphagia: Secondary | ICD-10-CM | POA: Diagnosis not present

## 2024-04-08 DIAGNOSIS — M519 Unspecified thoracic, thoracolumbar and lumbosacral intervertebral disc disorder: Secondary | ICD-10-CM | POA: Diagnosis not present

## 2024-04-08 DIAGNOSIS — E785 Hyperlipidemia, unspecified: Secondary | ICD-10-CM | POA: Diagnosis not present

## 2024-04-08 DIAGNOSIS — M47812 Spondylosis without myelopathy or radiculopathy, cervical region: Secondary | ICD-10-CM | POA: Diagnosis not present

## 2024-04-08 DIAGNOSIS — M069 Rheumatoid arthritis, unspecified: Secondary | ICD-10-CM | POA: Diagnosis not present

## 2024-04-08 DIAGNOSIS — F32A Depression, unspecified: Secondary | ICD-10-CM | POA: Diagnosis not present

## 2024-04-08 DIAGNOSIS — M47817 Spondylosis without myelopathy or radiculopathy, lumbosacral region: Secondary | ICD-10-CM | POA: Diagnosis not present

## 2024-04-08 DIAGNOSIS — I739 Peripheral vascular disease, unspecified: Secondary | ICD-10-CM | POA: Diagnosis not present

## 2024-04-08 DIAGNOSIS — D62 Acute posthemorrhagic anemia: Secondary | ICD-10-CM | POA: Diagnosis not present

## 2024-04-08 DIAGNOSIS — F419 Anxiety disorder, unspecified: Secondary | ICD-10-CM | POA: Diagnosis not present

## 2024-04-08 DIAGNOSIS — I2510 Atherosclerotic heart disease of native coronary artery without angina pectoris: Secondary | ICD-10-CM | POA: Diagnosis not present

## 2024-04-08 DIAGNOSIS — I480 Paroxysmal atrial fibrillation: Secondary | ICD-10-CM | POA: Diagnosis not present

## 2024-04-08 DIAGNOSIS — I10 Essential (primary) hypertension: Secondary | ICD-10-CM | POA: Diagnosis not present

## 2024-04-08 DIAGNOSIS — M4802 Spinal stenosis, cervical region: Secondary | ICD-10-CM | POA: Diagnosis not present

## 2024-04-08 DIAGNOSIS — M4807 Spinal stenosis, lumbosacral region: Secondary | ICD-10-CM | POA: Diagnosis not present

## 2024-04-08 DIAGNOSIS — M1611 Unilateral primary osteoarthritis, right hip: Secondary | ICD-10-CM | POA: Diagnosis not present

## 2024-04-08 DIAGNOSIS — I83813 Varicose veins of bilateral lower extremities with pain: Secondary | ICD-10-CM | POA: Diagnosis not present

## 2024-04-08 DIAGNOSIS — E538 Deficiency of other specified B group vitamins: Secondary | ICD-10-CM | POA: Diagnosis not present

## 2024-04-09 ENCOUNTER — Inpatient Hospital Stay: Admitting: Family

## 2024-04-10 ENCOUNTER — Encounter: Payer: Self-pay | Admitting: Family

## 2024-04-10 ENCOUNTER — Ambulatory Visit (INDEPENDENT_AMBULATORY_CARE_PROVIDER_SITE_OTHER): Admitting: Family

## 2024-04-10 VITALS — BP 130/72 | HR 65 | Temp 98.4°F | Ht 60.0 in | Wt 174.0 lb

## 2024-04-10 DIAGNOSIS — R1319 Other dysphagia: Secondary | ICD-10-CM | POA: Diagnosis not present

## 2024-04-10 DIAGNOSIS — E538 Deficiency of other specified B group vitamins: Secondary | ICD-10-CM | POA: Diagnosis not present

## 2024-04-10 DIAGNOSIS — I480 Paroxysmal atrial fibrillation: Secondary | ICD-10-CM | POA: Diagnosis not present

## 2024-04-10 DIAGNOSIS — I2510 Atherosclerotic heart disease of native coronary artery without angina pectoris: Secondary | ICD-10-CM | POA: Diagnosis not present

## 2024-04-10 DIAGNOSIS — D649 Anemia, unspecified: Secondary | ICD-10-CM

## 2024-04-10 DIAGNOSIS — R5383 Other fatigue: Secondary | ICD-10-CM | POA: Insufficient documentation

## 2024-04-10 DIAGNOSIS — H9193 Unspecified hearing loss, bilateral: Secondary | ICD-10-CM | POA: Diagnosis not present

## 2024-04-10 DIAGNOSIS — R296 Repeated falls: Secondary | ICD-10-CM | POA: Diagnosis not present

## 2024-04-10 DIAGNOSIS — F419 Anxiety disorder, unspecified: Secondary | ICD-10-CM | POA: Diagnosis not present

## 2024-04-10 DIAGNOSIS — I739 Peripheral vascular disease, unspecified: Secondary | ICD-10-CM | POA: Diagnosis not present

## 2024-04-10 DIAGNOSIS — I83813 Varicose veins of bilateral lower extremities with pain: Secondary | ICD-10-CM | POA: Diagnosis not present

## 2024-04-10 DIAGNOSIS — R82998 Other abnormal findings in urine: Secondary | ICD-10-CM

## 2024-04-10 DIAGNOSIS — I10 Essential (primary) hypertension: Secondary | ICD-10-CM | POA: Diagnosis not present

## 2024-04-10 DIAGNOSIS — M069 Rheumatoid arthritis, unspecified: Secondary | ICD-10-CM | POA: Diagnosis not present

## 2024-04-10 DIAGNOSIS — R2990 Unspecified symptoms and signs involving the nervous system: Secondary | ICD-10-CM

## 2024-04-10 DIAGNOSIS — D62 Acute posthemorrhagic anemia: Secondary | ICD-10-CM | POA: Diagnosis not present

## 2024-04-10 DIAGNOSIS — S0101XD Laceration without foreign body of scalp, subsequent encounter: Secondary | ICD-10-CM | POA: Diagnosis not present

## 2024-04-10 DIAGNOSIS — E785 Hyperlipidemia, unspecified: Secondary | ICD-10-CM | POA: Diagnosis not present

## 2024-04-10 DIAGNOSIS — R531 Weakness: Secondary | ICD-10-CM | POA: Diagnosis not present

## 2024-04-10 DIAGNOSIS — M1611 Unilateral primary osteoarthritis, right hip: Secondary | ICD-10-CM | POA: Diagnosis not present

## 2024-04-10 DIAGNOSIS — M4807 Spinal stenosis, lumbosacral region: Secondary | ICD-10-CM | POA: Diagnosis not present

## 2024-04-10 DIAGNOSIS — G4700 Insomnia, unspecified: Secondary | ICD-10-CM | POA: Diagnosis not present

## 2024-04-10 DIAGNOSIS — E039 Hypothyroidism, unspecified: Secondary | ICD-10-CM

## 2024-04-10 DIAGNOSIS — M519 Unspecified thoracic, thoracolumbar and lumbosacral intervertebral disc disorder: Secondary | ICD-10-CM | POA: Diagnosis not present

## 2024-04-10 DIAGNOSIS — M4316 Spondylolisthesis, lumbar region: Secondary | ICD-10-CM | POA: Diagnosis not present

## 2024-04-10 DIAGNOSIS — M47817 Spondylosis without myelopathy or radiculopathy, lumbosacral region: Secondary | ICD-10-CM | POA: Diagnosis not present

## 2024-04-10 DIAGNOSIS — M4802 Spinal stenosis, cervical region: Secondary | ICD-10-CM | POA: Diagnosis not present

## 2024-04-10 DIAGNOSIS — E66812 Obesity, class 2: Secondary | ICD-10-CM | POA: Diagnosis not present

## 2024-04-10 DIAGNOSIS — M1990 Unspecified osteoarthritis, unspecified site: Secondary | ICD-10-CM | POA: Diagnosis not present

## 2024-04-10 DIAGNOSIS — M47812 Spondylosis without myelopathy or radiculopathy, cervical region: Secondary | ICD-10-CM | POA: Diagnosis not present

## 2024-04-10 DIAGNOSIS — Z4802 Encounter for removal of sutures: Secondary | ICD-10-CM | POA: Insufficient documentation

## 2024-04-10 DIAGNOSIS — F32A Depression, unspecified: Secondary | ICD-10-CM | POA: Diagnosis not present

## 2024-04-10 DIAGNOSIS — G894 Chronic pain syndrome: Secondary | ICD-10-CM | POA: Diagnosis not present

## 2024-04-10 MED ORDER — ATORVASTATIN CALCIUM 40 MG PO TABS: 40.0000 mg | ORAL_TABLET | Freq: Every day | ORAL | 3 refills | Status: AC

## 2024-04-10 MED ORDER — DIVALPROEX SODIUM 500 MG PO DR TAB: 500.0000 mg | DELAYED_RELEASE_TABLET | Freq: Every day | ORAL | 3 refills | Status: AC

## 2024-04-10 NOTE — Progress Notes (Signed)
 Established Patient Office Visit  Subjective:      CC:  Chief Complaint  Patient presents with   Hospitalization Follow-up    HPI: Carmen [name] is a 83 y.o. female presenting on 04/10/2024 for Hospitalization Follow-up . Went to ER at [facility] 7/1 for a fall when she lost her balance and hit her head with her left side of the head hitting furniture causing a laceration and bleeding. She was sutured in the ER and had significant blood loss anemia drop from 11-7.6, given 1 unit PRBC. Discharged 7/3 with home health physical therapy.   Recommendation to have sutures removed x one week, per note she was placed with 3 figure 8 sutures and 2 simple interrupted sutures  Was stopped on plavix  x one day and advised to start again 7/4.   New complaints: She is here today with healing sutures on her left posterior scalp  She does still feel very weak, more so than normal, some increased sob /doe  She does have physical therapy, her first visit is coming to the home today.  Home health nurse came to the home to obtain vitals, and will be coming once a week for now.      Social history:  Relevant past medical, surgical, family and social history reviewed and updated as indicated. Interim medical history since our last visit reviewed.  Allergies and medications reviewed and updated.  DATA REVIEWED: CHART IN EPIC     ROS: Negative unless specifically indicated above in HPI.    Current Outpatient Medications:    aspirin  EC 81 MG tablet, Take 81 mg by mouth daily. Swallow whole., Disp: , Rfl:    BIOFREEZE ROLL-ON 4 % GEL, Apply 1 Application topically 3 (three) times daily., Disp: , Rfl:    CALCIUM  PO, Take 1 tablet by mouth daily., Disp: , Rfl:    Cholecalciferol (VITAMIN D -3) 25 MCG (1000 UT) CAPS, Take 1,000 Units by mouth daily., Disp: , Rfl:    clopidogrel  (PLAVIX ) 75 MG tablet, Take 1 tablet (75 mg total) by mouth daily., Disp: , Rfl:    cyanocobalamin  (VITAMIN B12) 500 MCG  tablet, Take 500 mcg by mouth daily., Disp: , Rfl:    diphenhydrAMINE (BENADRYL) 25 MG tablet, Take 25 mg by mouth at bedtime as needed for allergies., Disp: , Rfl:    famotidine  (PEPCID  AC) 10 MG tablet, Take 10 mg by mouth daily as needed for heartburn or indigestion., Disp: , Rfl:    furosemide  (LASIX ) 20 MG tablet, Take 1 tablet (20 mg total) by mouth daily as needed (for swelling)., Disp: 90 tablet, Rfl: 3   HYDROcodone -acetaminophen  (NORCO/VICODIN) 5-325 MG tablet, Take 1 tablet by mouth 2 (two) times daily as needed for severe pain (pain score 7-10). Must last 30 days., Disp: 60 tablet, Rfl: 0   hydroxychloroquine  (PLAQUENIL ) 200 MG tablet, Take 200 mg by mouth 2 (two) times daily., Disp: , Rfl:    levothyroxine  (SYNTHROID ) 137 MCG tablet, Take 1 tablet (137 mcg total) by mouth daily before breakfast., Disp: 90 tablet, Rfl: 3   loratadine  (EQ ALL DAY ALLERGY RELIEF) 10 MG tablet, Take 1 tablet by mouth once daily, Disp: 30 tablet, Rfl: 5   losartan  (COZAAR ) 25 MG tablet, Take 1/2 (one-half) tablet by mouth once daily, Disp: 45 tablet, Rfl: 1   magnesium  oxide (MAG-OX) 400 (240 Mg) MG tablet, Take 400 mg by mouth daily., Disp: , Rfl:    metoprolol  succinate (TOPROL -XL) 50 MG 24 hr tablet, Take 50  mg by mouth daily. Take with or immediately following a meal., Disp: , Rfl:    naloxone  (NARCAN ) nasal spray 4 mg/0.1 mL, Place 1 spray into the nose as needed for up to 365 doses (for opioid-induced respiratory depresssion). In case of emergency (overdose), spray once into each nostril. If no response within 3 minutes, repeat application and call 911., Disp: 1 each, Rfl: 0   pantoprazole  (PROTONIX ) 40 MG tablet, TAKE 1 TABLET BY MOUTH TWICE A DAY, Disp: 180 tablet, Rfl: 1   polyethylene glycol (MIRALAX  / GLYCOLAX ) 17 g packet, Take 17 g by mouth daily as needed. (Patient taking differently: Take 17 g by mouth daily as needed for mild constipation.), Disp: 14 each, Rfl: 0   traZODone  (DESYREL ) 100 MG  tablet, TAKE 1 TABLET BY MOUTH AT BEDTIME AS NEEDED FOR SLEEP, Disp: 90 tablet, Rfl: 0   atorvastatin  (LIPITOR) 40 MG tablet, Take 1 tablet (40 mg total) by mouth at bedtime., Disp: 90 tablet, Rfl: 3   divalproex  (DEPAKOTE ) 500 MG DR tablet, Take 1 tablet (500 mg total) by mouth at bedtime., Disp: 90 tablet, Rfl: 3      Objective:    BP 130/72   Pulse 65   Temp 98.4 F (36.9 C) (Oral)   Ht 5' (1.524 m)   Wt 174 lb (78.9 kg)   SpO2 97%   BMI 33.98 kg/m   Wt Readings from Last 3 Encounters:  04/10/24 174 lb (78.9 kg)  04/02/24 169 lb 1.5 oz (76.7 kg)  03/28/24 170 lb 3.2 oz (77.2 kg)    Physical Exam Vitals reviewed.  Constitutional:      General: She is not in acute distress.    Appearance: Normal appearance. She is normal weight. She is not ill-appearing, toxic-appearing or diaphoretic.  HENT:     Head: Normocephalic.  Cardiovascular:     Rate and Rhythm: Normal rate and regular rhythm.  Pulmonary:     Effort: Pulmonary effort is normal.     Breath sounds: Normal breath sounds.  Musculoskeletal:        General: Normal range of motion.  Skin:    Comments: Healing crusted laceration with edges approximated with five sutures intact. No active bleeding. No discharge from site. Slightly tender to touch. Difficult to visualize as sutures interweave with her hair as they were unable to shave prior to suturing.   Neurological:     General: No focal deficit present.     Mental Status: She is alert and oriented to person, place, and time. Mental status is at baseline.  Psychiatric:        Mood and Affect: Mood normal.        Behavior: Behavior normal.        Thought Content: Thought content normal.        Judgment: Judgment normal.    Sutures removed with sterile suture kit, after pt consent obtained.  Betadine applied prior to removal. Pt tolerated procedure well. five intact sutures removed without complication.   Post removal crusting evident but no active discharge and  or bloody discharged.         Assessment & Plan:  Other fatigue  Hyperlipidemia LDL goal <70 -     Atorvastatin  Calcium ; Take 1 tablet (40 mg total) by mouth at bedtime.  Dispense: 90 tablet; Refill: 3  Stroke-like symptoms vs. seizure like activity  -     Divalproex  Sodium; Take 1 tablet (500 mg total) by mouth at bedtime.  Dispense: 90  tablet; Refill: 3  Symptomatic anemia Assessment & Plan: Repeat cbc today ibc ferritin ordered pending  For fatigue, ordering tsh and bmp as well pending results.   Orders: -     CBC -     IBC + Ferritin  General weakness -     Basic metabolic panel with GFR  Hypothyroidism, unspecified type -     TSH  Leukocytes in urine -     Urine Culture; Future  Visit for suture removal Assessment & Plan: Sutures removed with sterile suture kit, after pt consent obtained.  Pt tolerated procedure well.  Betadine applied prior to removal. Pt tolerated procedure well. Five intact sutures removed without complication.         Return in about 3 months (around 07/11/2024) for f/u thyroid .  Ginger Patrick, MSN, APRN, FNP-C [facility]

## 2024-04-10 NOTE — Assessment & Plan Note (Signed)
 Repeat cbc today ibc ferritin ordered pending  For fatigue, ordering tsh and bmp as well pending results.

## 2024-04-10 NOTE — Assessment & Plan Note (Signed)
 Sutures removed with sterile suture kit, after pt consent obtained.  Pt tolerated procedure well.  Betadine applied prior to removal. Pt tolerated procedure well. Five intact sutures removed without complication.

## 2024-04-11 ENCOUNTER — Other Ambulatory Visit: Payer: Self-pay

## 2024-04-11 DIAGNOSIS — R82998 Other abnormal findings in urine: Secondary | ICD-10-CM

## 2024-04-11 LAB — IBC + FERRITIN
Ferritin: 20 ng/mL (ref 10.0–291.0)
Iron: 20 ug/dL — ABNORMAL LOW (ref 42–145)
Saturation Ratios: 6.1 % — ABNORMAL LOW (ref 20.0–50.0)
TIBC: 329 ug/dL (ref 250.0–450.0)
Transferrin: 235 mg/dL (ref 212.0–360.0)

## 2024-04-11 LAB — BASIC METABOLIC PANEL WITH GFR
BUN: 19 mg/dL (ref 6–23)
CO2: 28 meq/L (ref 19–32)
Calcium: 8.9 mg/dL (ref 8.4–10.5)
Chloride: 103 meq/L (ref 96–112)
Creatinine, Ser: 0.7 mg/dL (ref 0.40–1.20)
GFR: 80.62 mL/min (ref >60.00)
Glucose, Bld: 102 mg/dL — ABNORMAL HIGH (ref 70–99)
Potassium: 4.1 meq/L (ref 3.5–5.1)
Sodium: 137 meq/L (ref 135–145)

## 2024-04-11 LAB — CBC
HCT: 27.4 % — ABNORMAL LOW (ref 36.0–46.0)
Hemoglobin: 8.9 g/dL — ABNORMAL LOW (ref 12.0–15.0)
MCHC: 32.4 g/dL (ref 30.0–36.0)
MCV: 86.5 fl (ref 78.0–100.0)
Platelets: 210 K/uL (ref 150.0–400.0)
RBC: 3.17 Mil/uL — ABNORMAL LOW (ref 3.87–5.11)
RDW: 16.1 % — ABNORMAL HIGH (ref 11.5–15.5)
WBC: 4.1 K/uL (ref 4.0–10.5)

## 2024-04-11 LAB — TSH: TSH: 2.22 u[IU]/mL (ref 0.35–5.50)

## 2024-04-14 ENCOUNTER — Ambulatory Visit: Payer: Self-pay | Admitting: Family

## 2024-04-14 DIAGNOSIS — D500 Iron deficiency anemia secondary to blood loss (chronic): Secondary | ICD-10-CM

## 2024-04-14 LAB — URINE CULTURE
MICRO NUMBER:: 16688119
SPECIMEN QUALITY:: ADEQUATE

## 2024-04-15 ENCOUNTER — Ambulatory Visit: Attending: Nurse Practitioner | Admitting: Nurse Practitioner

## 2024-04-15 ENCOUNTER — Encounter: Payer: Self-pay | Admitting: Nurse Practitioner

## 2024-04-15 DIAGNOSIS — Z79891 Long term (current) use of opiate analgesic: Secondary | ICD-10-CM | POA: Insufficient documentation

## 2024-04-15 DIAGNOSIS — M79604 Pain in right leg: Secondary | ICD-10-CM | POA: Insufficient documentation

## 2024-04-15 DIAGNOSIS — M79605 Pain in left leg: Secondary | ICD-10-CM | POA: Diagnosis not present

## 2024-04-15 DIAGNOSIS — G8929 Other chronic pain: Secondary | ICD-10-CM | POA: Diagnosis not present

## 2024-04-15 DIAGNOSIS — M542 Cervicalgia: Secondary | ICD-10-CM | POA: Insufficient documentation

## 2024-04-15 DIAGNOSIS — Z79899 Other long term (current) drug therapy: Secondary | ICD-10-CM | POA: Diagnosis not present

## 2024-04-15 DIAGNOSIS — M545 Low back pain, unspecified: Secondary | ICD-10-CM | POA: Diagnosis not present

## 2024-04-15 DIAGNOSIS — G894 Chronic pain syndrome: Secondary | ICD-10-CM | POA: Insufficient documentation

## 2024-04-15 DIAGNOSIS — M47816 Spondylosis without myelopathy or radiculopathy, lumbar region: Secondary | ICD-10-CM | POA: Insufficient documentation

## 2024-04-15 MED ORDER — HYDROCODONE-ACETAMINOPHEN 5-325 MG PO TABS: 1.0000 | ORAL_TABLET | Freq: Two times a day (BID) | ORAL | 0 refills | Status: DC | PRN

## 2024-04-15 NOTE — Progress Notes (Signed)
 Nursing Pain Medication Assessment:  Safety precautions to be maintained throughout the outpatient stay will include: orient to surroundings, keep bed in low position, maintain call bell within reach at all times, provide assistance with transfer out of bed and ambulation.  Medication Inspection Compliance: Pill count conducted under aseptic conditions, in front of the patient. Neither the pills nor the bottle was removed from the patient's sight at any time. Once count was completed pills were immediately returned to the patient in their original bottle.  Medication: Hydrocodone /APAP Pill/Patch Count: 40 of 60 pills/patches remain Pill/Patch Appearance: Markings consistent with prescribed medication Bottle Appearance: Standard pharmacy container. Clearly labeled. Filled Date: 07 / 01 / 2025 Last Medication intake:  Today

## 2024-04-15 NOTE — Progress Notes (Signed)
 PROVIDER NOTE: Interpretation of information contained herein should be left to medically-trained personnel. Specific patient instructions are provided elsewhere under Patient Instructions section of medical record. This document was created in part using AI and STT-dictation technology, any transcriptional errors that may result from this process are unintentional.  Patient: Carmen [name]  Service: E/M   PCP: Corwin Antu, FNP  DOB: 03-Oct-1940  DOS: 04/15/2024  Provider: Emmy MARLA Blanch, NP  MRN: [id number]  Delivery: Face-to-face  Specialty: Interventional Pain Management  Type: Established Patient  Setting: Ambulatory outpatient facility  Specialty designation: 09  Referring Prov.: Corwin Antu, FNP  Location: Outpatient office facility       History of present illness (HPI) [name], a 83 y.o. year old female, is here today because of her No primary diagnosis found.. [name]'s primary complain today is Back Pain ( Radiates down both legs )  Pertinent problems: [name] has Peripheral vascular disease (HCC); Chronic low back pain (1ry area of pain) (Bilateral) (R>L) w/o sciatica; Dorsalgia, unspecified; Chronic pain syndrome; Cervical facet syndrome (bilateral) on their pertinent problem list.   Pain Assessment: Severity of Chronic pain is reported as a 6 /10. Location: Back Lower/Radiates down both legs. Onset: More than a month ago. Quality: Aching, Nagging. Timing: Intermittent. Modifying factor(s): Heat. Vitals:  height is 5' (1.524 m) and weight is 174 lb (78.9 kg). Her blood pressure is 132/74 and her pulse is 61. Her oxygen saturation is 100%.  BMI: Estimated body mass index is 33.98 kg/m as calculated from the following:   Height as of this encounter: 5' (1.524 m).   Weight as of this encounter: 174 lb (78.9 kg).  Last encounter: 04/07/2024. Last procedure: Visit date not found.  Reason for encounter: medication management. The patient indicates doing well with the current  medication regimen.  No adverse reaction or side effects reported to the medication. Patient compliance with her medication regimen and prescription drug monitoring program (PDMP).  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 She she also present with bruising around the left eye.   Pharmacotherapy Assessment   Hydrocodone -acetaminophen  (Norco/Vicodin) 5-325 mg 1 tab 2 times daily as needed for pain. MME=10  Monitoring: [place] PMP: PDMP reviewed during this encounter.       Pharmacotherapy: No side-effects or adverse reactions reported. Compliance: No problems identified. Effectiveness: Clinically acceptable.  [name], [place]  04/15/2024  1:49 PM  Sign when Signing Visit Nursing Pain Medication Assessment:  Safety precautions to be maintained throughout the outpatient stay will include: orient to surroundings, keep bed in low position, maintain call bell within reach at all times, provide assistance with transfer out of bed and ambulation.  Medication Inspection Compliance: Pill count conducted under aseptic conditions, in front of the patient. Neither the pills nor the bottle was removed from the patient's sight at any time. Once count was completed pills were immediately returned to the patient in their original bottle.  Medication: Hydrocodone /APAP Pill/Patch Count: 40 of 60 pills/patches remain Pill/Patch Appearance: Markings consistent with prescribed medication Bottle Appearance: Standard pharmacy container. Clearly labeled. Filled Date: 07 / 01 / 2025 Last Medication intake:  Today  UDS:  Summary  Date Value Ref Range Status  04/11/2023 Note  Final    Comment:    ==================================================================== [employer] 13 (MW) ==================================================================== Test                             Result  Flag       Units  Drug Present and Declared for Prescription Verification   Hydrocodone                     109           EXPECTED   ng/mg creat   Dihydrocodeine                 74           EXPECTED   ng/mg creat   Norhydrocodone                 432          EXPECTED   ng/mg creat    Sources of hydrocodone  include scheduled prescription medications.    Dihydrocodeine and norhydrocodone are expected metabolites of    hydrocodone . Dihydrocodeine is also available as a scheduled    prescription medication.  ==================================================================== Test                      Result    Flag   Units      Ref Range   Creatinine              78               mg/dL      >=79 ==================================================================== Declared Medications:  The flagging and interpretation on this report are based on the  following declared medications.  Unexpected results may arise from  inaccuracies in the declared medications.   **Note: The testing scope of this panel includes these medications:   Hydrocodone  (Norco)   **Note: The testing scope of this panel does not include the  following reported medications:   Acetaminophen  (Norco)  Atorvastatin  (Lipitor)  Calcium   Clopidogrel  (Plavix )  Diphenhydramine (Benadryl)  Divaleproex (Depakote )  Famotidine  (Pepcid )  Furosemide  (Lasix )  Hydroxychloroquine  (Plaquenil )  Levothyroxine  (Synthroid )  Loratadine  (Claritin )  Losartan  (Cozaar )  Magnesium  (Mag-Ox)  Metoprolol  (Toprol )  Naloxone  (Narcan )  Trazodone  (Desyrel )  Vitamin B12  Vitamin D3 ==================================================================== For clinical consultation, please call [phone]. ====================================================================     No results found for: CBDTHCR No results found for: D8THCCBX No results found for: D9THCCBX  ROS  Constitutional: Denies any fever or chills Gastrointestinal: No reported hemesis, hematochezia, vomiting, or acute GI distress Musculoskeletal: Back pain radiates to down to  the leg Neurological: No reported episodes of acute onset apraxia, aphasia, dysarthria, agnosia, amnesia, paralysis, loss of coordination, or loss of consciousness  Medication Review  Calcium , HYDROcodone -acetaminophen , Menthol (Topical Analgesic), Vitamin D -3, aspirin  EC, atorvastatin , clopidogrel , cyanocobalamin , diphenhydrAMINE, divalproex , famotidine , furosemide , hydroxychloroquine , levothyroxine , loratadine , losartan , magnesium  oxide, metoprolol  succinate, naloxone , pantoprazole , polyethylene glycol, and traZODone   History Review  Allergy: Ms. Mcbryar has no known allergies. Drug: Ms. Amaral  reports current drug use. Drug: Hydrocodone . Alcohol:  reports that she does not currently use alcohol. Tobacco:  reports that she quit smoking about 3 years ago. Her smoking use included cigarettes. She started smoking about 33 years ago. She has never used smokeless tobacco. Social: Ms. Bieker  reports that she quit smoking about 3 years ago. Her smoking use included cigarettes. She started smoking about 33 years ago. She has never used smokeless tobacco. She reports that she does not currently use alcohol. She reports current drug use. Drug: Hydrocodone . 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 Surgical: Ms. Woodberry  has a past surgical history that includes Cardiac catheterization; Coronary angioplasty; Coronary artery bypass graft; back injections; Cataract extraction; LEFT ATRIAL APPENDAGE OCCLUSION (N/A, 12/28/2022); and TEE without cardioversion (N/A, 12/28/2022). Family: family  history includes Bipolar disorder in her daughter; Breast cancer in her mother; Heart attack in her father.  Laboratory Chemistry Profile   Renal Lab Results  Component Value Date   BUN 19 04/10/2024   CREATININE 0.70 04/10/2024   BCR 19 03/28/2024   GFR 80.62 04/10/2024   GFRAA 73 11/22/2020   GFRNONAA >60 04/02/2024    Hepatic Lab Results  Component Value Date   AST 21 04/02/2024   ALT 17 04/02/2024   ALBUMIN 3.0 (L) 04/02/2024   ALKPHOS 41 04/02/2024   AMMONIA 19 10/06/2020    Electrolytes Lab Results  Component Value Date   NA 137 04/10/2024   K 4.1 04/10/2024   CL 103 04/10/2024   CALCIUM  8.9 04/10/2024   MG 1.9 03/28/2024    Bone Lab Results  Component Value Date   25OHVITD1 12 (L) 03/14/2021   25OHVITD2 <1.0 03/14/2021   25OHVITD3 12 03/14/2021    Inflammation (CRP: Acute Phase) (ESR: Chronic Phase) Lab Results  Component Value Date   CRP 0.8 03/14/2021   ESRSEDRATE 3 08/01/2022   LATICACIDVEN 1.2 12/18/2023         Note: Above Lab results reviewed.  Recent Imaging Review  CT CERVICAL SPINE WO CONTRAST CLINICAL DATA:  Head trauma, neck trauma, fall.  EXAM: CT HEAD WITHOUT CONTRAST  CT CERVICAL SPINE WITHOUT CONTRAST  TECHNIQUE: Multidetector CT imaging of the head and cervical spine was performed following the standard protocol without intravenous contrast. Multiplanar CT image reconstructions of the cervical spine were also generated.  RADIATION DOSE REDUCTION: This exam was performed according to the departmental dose-optimization program which includes automated exposure control, adjustment of the mA and/or kV according to patient size and/or use of iterative reconstruction technique.  COMPARISON:  12/18/2023.  FINDINGS: CT HEAD FINDINGS  Brain: No acute intracranial hemorrhage. No CT evidence of acute infarct. Redemonstrated remote infarct in the parasagittal right frontal lobe involving the right ACA territory. Additional  remote infarct in the left cerebellum. Nonspecific hypoattenuation in the periventricular and subcortical white matter favored to reflect chronic microvascular ischemic changes. Mild parenchymal volume loss. No edema, mass effect, or midline shift. The basilar cisterns are patent.  Ventricles: The ventricles are normal.  Vascular: Atherosclerotic calcifications of the carotid siphons. No hyperdense vessel.  Skull: No acute or aggressive finding.  Orbits: Bilateral lens replacement.  Sinuses: The visualized paranasal sinuses are clear.  Other: Mastoid air cells are clear. Soft tissue swelling in the left parieto-occipital scalp.  CT CERVICAL SPINE FINDINGS  Alignment: Straightening of the normal cervical lordosis. No listhesis. No facet subluxation or dislocation.  Skull base and vertebrae: No acute fracture. No primary bone lesion or focal pathologic process.  Soft tissues and spinal canal: No prevertebral fluid or swelling. No visible canal hematoma.  Disc levels: Intervertebral disc space narrowing throughout the cervical spine. Disc osteophyte complex at C3-4 resulting in mild spinal canal stenosis. Additional disc osteophyte complex at C4-5 slightly eccentric to the right resulting in mild spinal canal stenosis. Disc osteophyte complex at C5-6 resulting in mild spinal canal stenosis. Facet arthrosis  and uncovertebral hypertrophy at multiple levels. Moderate foraminal stenosis throughout the cervical spine. Foraminal stenosis most pronounced on the left at C3-4.  Upper chest: No acute finding.  Other: None.  IMPRESSION: No CT evidence of acute intracranial abnormality.  No acute fracture or traumatic malalignment of cervical spine.  Soft tissue swelling in the left parieto-occipital scalp.  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  Degenerative changes as  above.  Electronically Signed   By: Donnice Mania M.D.   On: 04/01/2024 15:24 CT HEAD WO CONTRAST CLINICAL DATA:  Head trauma, neck trauma, fall.  EXAM: CT HEAD WITHOUT CONTRAST  CT CERVICAL SPINE WITHOUT CONTRAST  TECHNIQUE: Multidetector CT imaging of the head and cervical spine was performed following the standard protocol without intravenous contrast. Multiplanar CT image reconstructions of the cervical spine were also generated.  RADIATION DOSE REDUCTION: This exam was performed according to the departmental dose-optimization program which includes automated exposure control, adjustment of the mA and/or kV according to patient size and/or use of iterative reconstruction technique.  COMPARISON:  12/18/2023.  FINDINGS: CT HEAD FINDINGS  Brain: No acute intracranial hemorrhage. No CT evidence of acute infarct. Redemonstrated remote infarct in the parasagittal right frontal lobe involving the right ACA territory. Additional remote infarct in the left cerebellum. Nonspecific hypoattenuation in the periventricular and subcortical white matter favored to reflect chronic microvascular ischemic changes. Mild parenchymal volume loss. No edema, mass effect, or midline shift. The basilar cisterns are patent.  Ventricles: The ventricles are normal.  Vascular: Atherosclerotic calcifications of the carotid siphons. No hyperdense vessel.  Skull: No acute or aggressive finding.  Orbits: Bilateral lens replacement.  Sinuses: The visualized paranasal sinuses are clear.  Other: Mastoid air cells are clear. Soft tissue swelling in the left parieto-occipital scalp.  CT CERVICAL SPINE FINDINGS  Alignment: Straightening of the normal cervical lordosis. No listhesis. No facet subluxation or dislocation.  Skull base and vertebrae: No acute fracture. No primary bone lesion or focal pathologic process.  Soft tissues and spinal canal: No prevertebral fluid or swelling. No visible  canal hematoma.  Disc levels: Intervertebral disc space narrowing throughout the cervical spine. Disc osteophyte complex at C3-4 resulting in mild spinal canal stenosis. Additional disc osteophyte complex at C4-5 slightly eccentric to the right resulting in mild spinal canal stenosis. Disc osteophyte complex at C5-6 resulting in mild spinal canal stenosis. Facet arthrosis and uncovertebral hypertrophy at multiple levels. Moderate foraminal stenosis throughout the cervical spine. Foraminal stenosis most pronounced on the left at C3-4.  Upper chest: No acute finding.  Other: None.  IMPRESSION: No CT evidence of acute intracranial abnormality.  No acute fracture or traumatic malalignment of cervical spine.  Soft tissue swelling in the left parieto-occipital scalp.  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  Degenerative changes as above.  Electronically Signed   By: Donnice Mania M.D.   On: 04/01/2024 15:24 DG Pelvis Portable CLINICAL DATA:  Fall.  EXAM: PORTABLE PELVIS 1-2 VIEWS  COMPARISON:  12/18/2023.  FINDINGS: There is no evidence of pelvic fracture or diastasis. Femoral heads are seated within the acetabula. Mild degenerative changes of the bilateral hips. Sacroiliac joints and pubic symphysis are anatomically aligned.  IMPRESSION: No acute osseous abnormality.  Electronically Signed   By: Harrietta Sherry M.D.   On: 04/01/2024 14:40 DG Chest Port 1 View CLINICAL DATA:  Fall.  EXAM: PORTABLE CHEST 1 VIEW  COMPARISON:  12/18/2023.  FINDINGS: Stable cardiomegaly. Prior median sternotomy and CABG. No focal consolidation, pleural effusion, or pneumothorax. Calcified granuloma again noted at the right lateral lung base. No acute osseous abnormality.  IMPRESSION: No acute findings in the chest.  Electronically Signed   By: Harrietta Sherry M.D.   On:  04/01/2024 14:40 Note: Reviewed        Physical Exam  Vitals: BP 132/74   Pulse 61   Ht 5' (1.524 m)   Wt 174 lb (78.9 kg)   SpO2 100%   BMI 33.98 kg/m  BMI: Estimated body mass index is 33.98 kg/m as calculated from the following:   Height as of this encounter: 5' (1.524 m).   Weight as of this encounter: 174 lb (78.9 kg). Ideal: Ideal body weight: 45.5 kg (100 lb 4.9 oz) Adjusted ideal body weight: 58.9 kg (129 lb 12.6 oz) General appearance: Well nourished, well developed, and well hydrated. In no apparent acute distress Mental status: Alert, oriented x 3 (person, place, & time)       Respiratory: No evidence of acute respiratory distress Eyes: PERLA   Assessment   Diagnosis Status  1. Chronic low back pain (1ry area of Pain) (Bilateral) (L>R) w/o sciatica   2. Chronic lower extremity pain (3ry area of Pain) (Bilateral) (L>R)   3. Pharmacologic therapy   4. Chronic pain syndrome   5. Encounter for medication management   6. Chronic neck pain (2ry area of Pain) (Bilateral) (L>R)   7. Lumbar facet joint syndrome (Bilateral)   8. Chronic use of opiate for therapeutic purpose   9. Encounter for chronic pain management    Controlled Controlled Controlled   Updated Problems: No problems updated.  Plan of Care  Problem-specific:  Assessment and Plan  Will continue on current medication regimen.  Prescribing drug monitoring (PDMP) reviewed; findings consistent with the use of prescribed medication and no evidence of narcotic misuse or abuse.  Urine drug screening (UDS) up-to-date.  No other new issues or problems reported to this visit.  Schedule follow-up in 90 days for medication management.   Ms. Halynn Bathe has a current medication list which includes the following long-term medication(s): atorvastatin , calcium , diphenhydramine, divalproex , famotidine , furosemide , levothyroxine , eq all day allergy relief, losartan , metoprolol  succinate, naloxone , pantoprazole ,  trazodone , [START ON 05/01/2024] hydrocodone -acetaminophen , [START ON 05/31/2024] hydrocodone -acetaminophen , and [START ON 06/30/2024] hydrocodone -acetaminophen .  Pharmacotherapy (Medications Ordered): Meds ordered this encounter  Medications   HYDROcodone -acetaminophen  (NORCO/VICODIN) 5-325 MG tablet    Sig: Take 1 tablet by mouth 2 (two) times daily as needed for severe pain (pain score 7-10). Must last 30 days.    Dispense:  60 tablet    Refill:  0    DO NOT: delete (not duplicate); no partial-fill (will deny script to complete), no refill request (F/U required). DISPENSE: 1 day early if closed on fill date. WARN: No CNS-depressants within 8 hrs of med.   HYDROcodone -acetaminophen  (NORCO/VICODIN) 5-325 MG tablet    Sig: Take 1 tablet by mouth 2 (two) times daily as needed for severe pain (pain score 7-10). Must last 30 days.    Dispense:  60 tablet    Refill:  0    DO NOT: delete (not duplicate); no partial-fill (will deny script to complete), no refill request (F/U required). DISPENSE: 1 day early if closed on fill date. WARN: No CNS-depressants within 8 hrs of med.   HYDROcodone -acetaminophen  (NORCO/VICODIN) 5-325 MG tablet    Sig: Take  1 tablet by mouth 2 (two) times daily as needed for severe pain (pain score 7-10). Must last 30 days.    Dispense:  60 tablet    Refill:  0    DO NOT: delete (not duplicate); no partial-fill (will deny script to complete), no refill request (F/U required). DISPENSE: 1 day early if closed on fill date. WARN: No CNS-depressants within 8 hrs of med.   Orders:  No orders of the defined types were placed in this encounter.       Return in about 3 months (around 07/16/2024) for (F2F), (MM), Emmy Blanch NP.    Recent Visits No visits were found meeting these conditions. Showing recent visits within past 90 days and meeting all other requirements Today's Visits Date Type Provider Dept  04/15/24 Office Visit Miria Cappelli K, NP [facility]   Showing today's visits and meeting all other requirements Future Appointments No visits were found meeting these conditions. Showing future appointments within next 90 days and meeting all other requirements  I discussed the assessment and treatment plan with the patient. The patient was provided an opportunity to ask questions and all were answered. The patient agreed with the plan and demonstrated an understanding of the instructions.  Patient advised to call back or seek an in-person evaluation if the symptoms or condition worsens.  Duration of encounter: 30 minutes.  Total time on encounter, as per AMA guidelines included both the face-to-face and non-face-to-face time personally spent by the physician and/or other qualified health care professional(s) on the day of the encounter (includes time in activities that require the physician or other qualified health care professional and does not include time in activities normally performed by clinical staff). Physician's time may include the following activities when performed: Preparing to see the patient (e.g., pre-charting review of records, searching for previously ordered imaging, lab work, and nerve conduction tests) Review of prior analgesic pharmacotherapies. Reviewing PMP Interpreting ordered tests (e.g., lab work, imaging, nerve conduction tests) Performing post-procedure evaluations, including interpretation of diagnostic procedures Obtaining and/or reviewing separately obtained history Performing a medically appropriate examination and/or evaluation Counseling and educating the patient/family/caregiver Ordering medications, tests, or procedures Referring and communicating with other health care professionals (when not separately reported) Documenting clinical information in the electronic or other health record Independently interpreting results (not separately reported) and communicating results to the patient/  family/caregiver Care coordination (not separately reported)  Note by: Roshawn Lacina K Jacquelyn Shadrick, NP (TTS and AI technology used. I apologize for any typographical errors that were not detected and corrected.) Date: 04/15/2024; Time: 3:01 PM

## 2024-04-16 ENCOUNTER — Telehealth: Payer: Self-pay | Admitting: Internal Medicine

## 2024-04-16 MED ORDER — CLOPIDOGREL BISULFATE 75 MG PO TABS: 75.0000 mg | ORAL_TABLET | Freq: Every day | ORAL | 3 refills | Status: DC

## 2024-04-16 MED ORDER — IRON (FERROUS SULFATE) 325 (65 FE) MG PO TABS
325.0000 mg | ORAL_TABLET | Freq: Every day | ORAL | 0 refills | Status: DC
Start: 2024-04-16 — End: 2024-05-09

## 2024-04-16 NOTE — Telephone Encounter (Signed)
 Left message on verified VM for daughter to call office.

## 2024-04-16 NOTE — Telephone Encounter (Signed)
 I spoke with Carmen [name] (DPR signed) and per pts daughter her condition is the same as when last seen. Pt's weakness and feeling tired is no worse. No more SOB than usual and no more pale than normal. No fast heart beat/palpitation.Carmen [name] is on her way back from [place]  and will bring pt in AM for CBC with diff. UC & ED precautions given and Carmen [name] voiced understanding. Lab appt scheduled 04/17/24 at 9:00am for CBC with diff. Pts daughter also aware pt is to take iron  325mg  once daily and Carmen [name] will pick up at [facility]. Sending FYI to ONEIDA Patrick FNP.

## 2024-04-16 NOTE — Telephone Encounter (Signed)
*  STAT* If patient is at the pharmacy, call can be transferred to refill team.   1. Which medications need to be refilled? (please list name of each medication and dose if known)   clopidogrel  (PLAVIX ) 75 MG tablet    2. Which pharmacy/location (including street and city if local pharmacy) is medication to be sent to? [facility] [address], [place] - [address] Phone: [phone]  Fax: [phone]     3. Do they need a 30 day or 90 day supply? 90  Pt has 1 dose left

## 2024-04-16 NOTE — Telephone Encounter (Signed)
 RX sent to requested Pharmacy

## 2024-04-16 NOTE — Progress Notes (Signed)
 Chief Complaint  Patient presents with  . 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 She was last seen 10/22/2023 and was doing well on plaquenil . We continued her regimen.  Today She is doing well with her joints since last visit noting she does not have as much hand pain as she did previously, and she is not having prominent morning stiffness. She has had strokes and falls since last visit, as well as substantial blood loss due to an injury following one of those falls, but her joints have fared well through all of that. She is working with PT on balance and falls prevention now.  Remedy Meneely has tried the following rheumatologic medications in the past: - Plaquenil  - current   Sharie's work status is retired, and she is living with her daughter Chuckie since 2021. She is a former smoker. She quit smoking around age 40. She drinks an average of 0 servings of alcohol weekly. Mikael denies any family history of known autoimmune disease otherwise.   Review of Systems ROS was negative except as noted above.  Patient Active Problem List   Diagnosis Date Noted  . 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 History of stroke 02/17/2021  . Falls frequently 02/17/2021  . Difficulty sleeping 02/17/2021  . Chronic low back pain 02/17/2021  . Paroxysmal atrial fibrillation (CMS/HHS-HCC) 11/18/2020  . Repeated falls 11/18/2020  . Anemia, unspecified 10/02/2020  . Athscl heart disease of native coronary artery w/o ang pctrs 10/02/2020  . Depression, unspecified 10/02/2020  . Dorsalgia, unspecified 10/02/2020  . Hyperlipidemia, unspecified 10/02/2020  . Acquired hypothyroidism 10/02/2020  . Long term (current) use of anticoagulants 10/02/2020  . 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 Presence of coronary angioplasty implant and graft 10/02/2020  . 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 Vitamin B deficiency, unspecified 10/02/2020    Past Medical History:  Diagnosis Date  . Acquired hypothyroidism 10/02/2020  . Anemia, unspecified 10/02/2020  . Athscl heart disease of native coronary artery w/o ang pctrs 10/02/2020  . Depression, unspecified 10/02/2020  . Dorsalgia, unspecified 10/02/2020  . Hyperlipidemia, unspecified 10/02/2020  . Long term (current) use of anticoagulants 10/02/2020  . Paroxysmal atrial fibrillation (CMS/HHS-HCC) 11/18/2020  . 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 Presence of coronary angioplasty implant and graft 10/02/2020  . Prsnl hx of TIA (TIA), and cereb infrc w/o resid deficits 10/02/2020  . Repeated falls 11/18/2020  . Vitamin B deficiency, unspecified 10/02/2020   Past Surgical History:  Procedure Laterality Date  . cardiac bypass x4    . heart stent     Social History   Socioeconomic History  . Marital status: Widowed  Tobacco Use  . Smoking status: Former  . Smokeless tobacco: Never  Vaping Use  . Vaping status: Former  Substance and Sexual Activity  . Alcohol use: Not Currently  . Drug use: Not Currently  . Sexual activity: Defer   Social Drivers of Health   Financial Resource Strain: Low Risk  (08/20/2023)   Received from [facility]   Overall Financial Resource Strain (CARDIA)   . Difficulty of Paying Living Expenses: Not hard at all  Food Insecurity: No Food Insecurity (04/02/2024)   Received from [facility]   Hunger Vital Sign   . Within the past 12 months, you worried that your food would run  out before you got the money to buy more.: Never true   . Within the past 12 months, the food you bought just didn't last and you didn't have money to get more.: Never true  Transportation Needs: No Transportation Needs (04/02/2024)   Received from [facility] - Transportation   . In the past 12 months, has lack of transportation kept you from medical appointments or from getting  medications?: No   . In the past 12 months, has lack of transportation kept you from meetings, work, or from getting things needed for daily living?: No  Physical Activity: Insufficiently Active (08/20/2023)   Received from [facility]   Exercise Vital Sign   . On average, how many days per week do you engage in moderate to strenuous exercise (like a brisk walk)?: 7 days   . On average, how many minutes do you engage in exercise at this level?: 20 min  Stress: No Stress Concern Present (08/20/2023)   Received from [facility] - Occupational Stress Questionnaire   . Feeling of Stress : Not at all  Social Connections: Moderately Isolated (04/02/2024)   Received from [facility]   Social Connection and Isolation Panel   . In a typical week, how many times do you talk on the phone with family, friends, or neighbors?: More than three times a week   . How often do you get together with friends or relatives?: More than three times a week   . How often do you attend church or religious services?: 1 to 4 times per year   . Do you belong to any clubs or organizations such as church groups, unions, fraternal or athletic groups, or school groups?: No   . How often do you attend meetings of the clubs or organizations you belong to?: Never   . Are you married, widowed, divorced, separated, never married, or living with a partner?: Widowed  Housing Stability: Unknown (10/22/2023)   Housing Stability Vital Sign   . Homeless in the Last Year: No   Family History  Problem Relation Name Age of Onset  . No Known Problems Mother    . Heart disease Father      Physical Exam:  BP 120/74   Pulse 62   Ht 152.4 cm (5')   Wt 78.9 kg (174 lb)   SpO2 97% Comment: RA  BMI 33.98 kg/m   General: Pleasant elderly white woman sitting in chair. Well groomed, no acute distress. Non-toxic appearance.  HEENT: Conjunctivae normal.  Pulmonary: Normal effort of breathing.  Symmetric chest expansion.  Musculoskeletal: Nontender to palpation throughout hand exam. No appreciable synovitis. Fist formation unimpaired bilaterally. Negative squeeze tests to hands. No other tender, synovitic, warm, erythematous, or deformed joints. FROM all joints except as above.  Skin: Skin warm and dry. No rashes appreciable. Neurologic: Oriented to time, person, place, and situation. Normal gait.  Psychological: Normal behavior, thought content, and judgment Records were reviewed. No visits with results within 3 Month(s) from this visit.  Latest known visit with results is:  Initial consult on 10/26/2022  Component Date Value Ref Range Status  . Sedimentation Rate-Automated 10/26/2022 6  0 - 30 mm/hr Final  . C Reactive Protein - [facility] 10/26/2022 <1  0 - 10 mg/L Final  . Complement C3, Serum - [facility] 10/26/2022 145  82 - 167 mg/dL Final  . Complement C4, Serum - [facility] 10/26/2022 21  12 - 38 mg/dL  Final  . Color 10/27/2022 Yellow  Colorless, Straw, Light Yellow, Yellow, Dark Yellow Final  . Clarity 10/27/2022 Cloudy (!)  Clear Final  . Specific Gravity 10/27/2022 1.023  1.005 - 1.030 Final  . pH, Urine 10/27/2022 7.0  5.0 - 8.0 Final  . Protein, Urinalysis 10/27/2022 Trace (!)  Negative mg/dL Final  . Glucose, Urinalysis 10/27/2022 Negative  Negative mg/dL Final  . Ketones, Urinalysis 10/27/2022 Negative  Negative mg/dL Final  . Blood, Urinalysis 10/27/2022 Negative  Negative Final  . Nitrite, Urinalysis 10/27/2022 Negative  Negative Final  . Leukocyte Esterase, Urinalysis 10/27/2022 Negative  Negative Final  . Bilirubin, Urinalysis 10/27/2022 Negative  Negative Final  . Urobilinogen, Urinalysis 10/27/2022 0.2  0.2 - 1.0 mg/dL Final  . WBC, UA 98/73/7975 2  <=5 /hpf Final  . Red Blood Cells, Urinalysis 10/27/2022 2  <=3 /hpf Final  . Bacteria, Urinalysis 10/27/2022 0-5  0 - 5 /hpf Final  . Squamous Epithelial Cells, Urinaly* 10/27/2022 1  /hpf Final  . Anti-DNA (DS) Ab Qn  - [facility] 10/26/2022 <1  0 - 9 IU/mL Final  . Sjogren's Anti-SS-A - [facility] 10/26/2022 <0.2  0.0 - 0.9 AI Final  . Sjogren's Anti-SS-B - [facility] 10/26/2022 <0.2  0.0 - 0.9 AI Final  . Anticardiolipin Ab,IgG,Qn - [facility] 10/26/2022 <9  0 - 14 GPL U/mL Final  . Anticardiolipin Ab,IgM,Qn - [facility] 10/26/2022 <9  0 - 12 MPL U/mL Final  . Beta-2 Glyco 1 IgG - [facility] 10/26/2022 <9  0 - 20 GPI IgG units Final  . Beta-2 Glyco 1 IgA - [facility] 10/26/2022 <9  0 - 25 GPI IgA units Final  . Beta-2 Glyco 1 IgM - [facility] 10/26/2022 <9  0 - 32 GPI IgM units Final  . PTT Lupus Anticoagulant - [facility] 10/26/2022 31.4  0.0 - 43.5 sec Final  . Dilute Viper Venom Time - [facility] 10/26/2022 52.8 (H)  0.0 - 47.0 sec Final  . Lupus Reflex Interpretation - [facility] 10/26/2022 Comment:   Final  . dRVVT Mix - [facility] 10/26/2022 46.4 (H)  0.0 - 40.4 sec Final  . dRVVT Confirm - [facility] 10/26/2022 0.9  0.8 - 1.2 ratio Final    Assessment and Plan: Calcium  pyrophosphate arthropathy  (primary encounter diagnosis) Plan: hydroxychloroquine  (PLAQUENIL ) 200 mg tablet  Long-term use of Plaquenil    1. CPPD - Interval history and physical reveal controlled disease since last visit. Continue plaquenil .   2.  Long term use of plaquenil -Assessed for side effects and recent labs reviewed.  Up to date on ophthalmology plaquenil  toxicity screening.  Questions were welcomed and answered.  Follow up in 6 months.     Medication List       * Accurate as of April 21, 2024  9:10 AM. If you have any questions, ask your nurse or doctor.          CONTINUE taking these medications    amLODIPine -benazepril  5-10 mg capsule Commonly known as: LOTREL   aspirin  81 MG EC tablet   atorvastatin  20 MG tablet Commonly known as: LIPITOR   B COMPLEX 1 (WITH FOLIC ACID) ORAL   BIOFREEZE (MENTHOL) 4 % Gel Generic drug: menthol   calcium  carbonate 500 mg calcium  (1,250 mg) tablet   cholecalciferol 1000 unit tablet   clonazePAM   0.5 MG tablet Commonly known as: KlonoPIN    clopidogreL  75 mg tablet Commonly known as: PLAVIX    clotrimazole 10 mg troche Commonly known as: MYCELEX   cyanocobalamin  500 MCG tablet Commonly known as: VITAMIN  B12   divalproex  500 MG DR tablet Commonly known as: DEPAKOTE    ELIQUIS  5 mg tablet Generic drug: apixaban    ferrous sulfate  325 (65 FE) MG tablet   FUROsemide  20 MG tablet Commonly known as: LASIX    gabapentin 100 MG capsule Commonly known as: NEURONTIN Take 100 mg twice a day for one week, then increase to 200 mg(2 tablets) twice a day and continue   hydroCHLOROthiazide  25 MG tablet Commonly known as: HYDRODIURIL    HYDROcodone -acetaminophen  5-325 mg tablet Commonly known as: NORCO   hydroxychloroquine  200 mg tablet Commonly known as: PLAQUENIL  Take 1 tablet (200 mg total) by mouth 2 (two) times daily for 180 days   levothyroxine  100 MCG tablet Commonly known as: SYNTHROID    loratadine  10 mg tablet Commonly known as: CLARITIN    losartan  25 MG tablet Commonly known as: COZAAR    magnesium  oxide 400 mg (241.3 mg magnesium ) tablet Commonly known as: MAG-OX   metoprolol  SUCCinate 100 MG XL tablet Commonly known as: TOPROL -XL   nystatin -triamcinolone  ointment   PARoxetine 20 MG tablet Commonly known as: PAXIL   polyethylene glycol packet Commonly known as: MIRALAX    traZODone  50 MG tablet Commonly known as: DESYREL          Where to Get Your Medications     These medications were sent to [facility] [address] [place] - [address]  [address] [place] [address]    Phone: [phone]  hydroxychloroquine  200 mg tablet    No orders of the defined types were placed in this encounter.  *Some images could not be shown.

## 2024-04-16 NOTE — Telephone Encounter (Signed)
 Also please advise them when you call that I sent in iron  I want her to take one tablet once daily.

## 2024-04-16 NOTE — Telephone Encounter (Signed)
 Please call and inquire is she more tired and weak than normal ? More tired and weak since the last time I saw her when we removed the stitches?   Is she having any worsening shortness of breath either resting or with movement?   Is she more pale than usual.   Have her come in for stat repeat of her cbc to see if she has lost more blood  If she has any of the above though with worsening weakness/fatigue/sob and or increased pallor or racing heart /palpitations then she needs to go to the ER.

## 2024-04-17 ENCOUNTER — Other Ambulatory Visit (INDEPENDENT_AMBULATORY_CARE_PROVIDER_SITE_OTHER)

## 2024-04-17 DIAGNOSIS — D500 Iron deficiency anemia secondary to blood loss (chronic): Secondary | ICD-10-CM | POA: Diagnosis not present

## 2024-04-17 DIAGNOSIS — M519 Unspecified thoracic, thoracolumbar and lumbosacral intervertebral disc disorder: Secondary | ICD-10-CM | POA: Diagnosis not present

## 2024-04-17 DIAGNOSIS — M1990 Unspecified osteoarthritis, unspecified site: Secondary | ICD-10-CM | POA: Diagnosis not present

## 2024-04-17 DIAGNOSIS — M4802 Spinal stenosis, cervical region: Secondary | ICD-10-CM | POA: Diagnosis not present

## 2024-04-17 DIAGNOSIS — I2510 Atherosclerotic heart disease of native coronary artery without angina pectoris: Secondary | ICD-10-CM | POA: Diagnosis not present

## 2024-04-17 DIAGNOSIS — E538 Deficiency of other specified B group vitamins: Secondary | ICD-10-CM | POA: Diagnosis not present

## 2024-04-17 DIAGNOSIS — I480 Paroxysmal atrial fibrillation: Secondary | ICD-10-CM | POA: Diagnosis not present

## 2024-04-17 DIAGNOSIS — M1611 Unilateral primary osteoarthritis, right hip: Secondary | ICD-10-CM | POA: Diagnosis not present

## 2024-04-17 DIAGNOSIS — E039 Hypothyroidism, unspecified: Secondary | ICD-10-CM | POA: Diagnosis not present

## 2024-04-17 DIAGNOSIS — R1319 Other dysphagia: Secondary | ICD-10-CM | POA: Diagnosis not present

## 2024-04-17 DIAGNOSIS — F419 Anxiety disorder, unspecified: Secondary | ICD-10-CM | POA: Diagnosis not present

## 2024-04-17 DIAGNOSIS — G4700 Insomnia, unspecified: Secondary | ICD-10-CM | POA: Diagnosis not present

## 2024-04-17 DIAGNOSIS — I83813 Varicose veins of bilateral lower extremities with pain: Secondary | ICD-10-CM | POA: Diagnosis not present

## 2024-04-17 DIAGNOSIS — E66812 Obesity, class 2: Secondary | ICD-10-CM | POA: Diagnosis not present

## 2024-04-17 DIAGNOSIS — G894 Chronic pain syndrome: Secondary | ICD-10-CM | POA: Diagnosis not present

## 2024-04-17 DIAGNOSIS — M4316 Spondylolisthesis, lumbar region: Secondary | ICD-10-CM | POA: Diagnosis not present

## 2024-04-17 DIAGNOSIS — M47812 Spondylosis without myelopathy or radiculopathy, cervical region: Secondary | ICD-10-CM | POA: Diagnosis not present

## 2024-04-17 DIAGNOSIS — H9193 Unspecified hearing loss, bilateral: Secondary | ICD-10-CM | POA: Diagnosis not present

## 2024-04-17 DIAGNOSIS — M47817 Spondylosis without myelopathy or radiculopathy, lumbosacral region: Secondary | ICD-10-CM | POA: Diagnosis not present

## 2024-04-17 DIAGNOSIS — D62 Acute posthemorrhagic anemia: Secondary | ICD-10-CM | POA: Diagnosis not present

## 2024-04-17 DIAGNOSIS — F32A Depression, unspecified: Secondary | ICD-10-CM | POA: Diagnosis not present

## 2024-04-17 DIAGNOSIS — I739 Peripheral vascular disease, unspecified: Secondary | ICD-10-CM | POA: Diagnosis not present

## 2024-04-17 DIAGNOSIS — E785 Hyperlipidemia, unspecified: Secondary | ICD-10-CM | POA: Diagnosis not present

## 2024-04-17 DIAGNOSIS — M069 Rheumatoid arthritis, unspecified: Secondary | ICD-10-CM | POA: Diagnosis not present

## 2024-04-17 DIAGNOSIS — I10 Essential (primary) hypertension: Secondary | ICD-10-CM | POA: Diagnosis not present

## 2024-04-17 DIAGNOSIS — M4807 Spinal stenosis, lumbosacral region: Secondary | ICD-10-CM | POA: Diagnosis not present

## 2024-04-17 LAB — CBC WITH DIFFERENTIAL/PLATELET
Basophils Absolute: 0 K/uL (ref 0.0–0.1)
Basophils Relative: 0.7 % (ref 0.0–3.0)
Eosinophils Absolute: 0.2 K/uL (ref 0.0–0.7)
Eosinophils Relative: 4.7 % (ref 0.0–5.0)
HCT: 26.9 % — ABNORMAL LOW (ref 36.0–46.0)
Hemoglobin: 8.8 g/dL — ABNORMAL LOW (ref 12.0–15.0)
Lymphocytes Relative: 21.3 % (ref 12.0–46.0)
Lymphs Abs: 1 K/uL (ref 0.7–4.0)
MCHC: 32.8 g/dL (ref 30.0–36.0)
MCV: 85.8 fl (ref 78.0–100.0)
Monocytes Absolute: 0.5 K/uL (ref 0.1–1.0)
Monocytes Relative: 11.6 % (ref 3.0–12.0)
Neutro Abs: 2.9 K/uL (ref 1.4–7.7)
Neutrophils Relative %: 61.7 % (ref 43.0–77.0)
Platelets: 222 K/uL (ref 150.0–400.0)
RBC: 3.13 Mil/uL — ABNORMAL LOW (ref 3.87–5.11)
RDW: 15.4 % (ref 11.5–15.5)
WBC: 4.7 K/uL (ref 4.0–10.5)

## 2024-04-17 MED ORDER — CLOPIDOGREL BISULFATE 75 MG PO TABS: 75.0000 mg | ORAL_TABLET | Freq: Every day | ORAL | 3 refills | Status: AC

## 2024-04-17 NOTE — Addendum Note (Signed)
 Addended by: BLUFORD, Gabriell Daigneault L on: 04/17/2024 10:12 AM   Modules accepted: Orders

## 2024-04-17 NOTE — Telephone Encounter (Signed)
 RX resent to requested Pharmacy

## 2024-04-17 NOTE — Telephone Encounter (Signed)
*  STAT* If patient is at the pharmacy, call can be transferred to refill team.   1. Which medications need to be refilled? (please list name of each medication and dose if known)   clopidogrel  (PLAVIX ) 75 MG tablet   2. Would you like to learn more about the convenience, safety, & potential cost savings by using the [facility]?   3. Are you open to using the [facility] (Type [facility]. ).  4. Which pharmacy/location (including street and city if local pharmacy) is medication to be sent to?  [facility] [address], [place] - [address]   5. Do they need a 30 day or 90 day supply?  90 day  Daughter ([name]) stated patient is completely out of this medication and her pharmacy told her they have not received the refill.  Daughter wants prescription re-sent.

## 2024-04-18 DIAGNOSIS — I83813 Varicose veins of bilateral lower extremities with pain: Secondary | ICD-10-CM

## 2024-04-18 DIAGNOSIS — I2510 Atherosclerotic heart disease of native coronary artery without angina pectoris: Secondary | ICD-10-CM | POA: Diagnosis not present

## 2024-04-18 DIAGNOSIS — Z556 Problems related to health literacy: Secondary | ICD-10-CM

## 2024-04-18 DIAGNOSIS — I739 Peripheral vascular disease, unspecified: Secondary | ICD-10-CM | POA: Diagnosis not present

## 2024-04-18 DIAGNOSIS — Z7982 Long term (current) use of aspirin: Secondary | ICD-10-CM

## 2024-04-18 DIAGNOSIS — Z974 Presence of external hearing-aid: Secondary | ICD-10-CM

## 2024-04-18 DIAGNOSIS — Z9181 History of falling: Secondary | ICD-10-CM

## 2024-04-18 DIAGNOSIS — D62 Acute posthemorrhagic anemia: Secondary | ICD-10-CM | POA: Diagnosis not present

## 2024-04-18 DIAGNOSIS — Z955 Presence of coronary angioplasty implant and graft: Secondary | ICD-10-CM

## 2024-04-18 DIAGNOSIS — M519 Unspecified thoracic, thoracolumbar and lumbosacral intervertebral disc disorder: Secondary | ICD-10-CM

## 2024-04-18 DIAGNOSIS — E039 Hypothyroidism, unspecified: Secondary | ICD-10-CM

## 2024-04-18 DIAGNOSIS — M1611 Unilateral primary osteoarthritis, right hip: Secondary | ICD-10-CM

## 2024-04-18 DIAGNOSIS — R1319 Other dysphagia: Secondary | ICD-10-CM

## 2024-04-18 DIAGNOSIS — I10 Essential (primary) hypertension: Secondary | ICD-10-CM | POA: Diagnosis not present

## 2024-04-18 DIAGNOSIS — Z9849 Cataract extraction status, unspecified eye: Secondary | ICD-10-CM

## 2024-04-18 DIAGNOSIS — Z6839 Body mass index (BMI) 39.0-39.9, adult: Secondary | ICD-10-CM

## 2024-04-18 DIAGNOSIS — Z87891 Personal history of nicotine dependence: Secondary | ICD-10-CM

## 2024-04-18 DIAGNOSIS — Z8673 Personal history of transient ischemic attack (TIA), and cerebral infarction without residual deficits: Secondary | ICD-10-CM

## 2024-04-18 DIAGNOSIS — M4316 Spondylolisthesis, lumbar region: Secondary | ICD-10-CM

## 2024-04-18 DIAGNOSIS — G894 Chronic pain syndrome: Secondary | ICD-10-CM

## 2024-04-18 DIAGNOSIS — G4700 Insomnia, unspecified: Secondary | ICD-10-CM

## 2024-04-18 DIAGNOSIS — Z951 Presence of aortocoronary bypass graft: Secondary | ICD-10-CM

## 2024-04-18 DIAGNOSIS — M47817 Spondylosis without myelopathy or radiculopathy, lumbosacral region: Secondary | ICD-10-CM

## 2024-04-18 DIAGNOSIS — E66812 Obesity, class 2: Secondary | ICD-10-CM

## 2024-04-18 DIAGNOSIS — M1990 Unspecified osteoarthritis, unspecified site: Secondary | ICD-10-CM

## 2024-04-18 DIAGNOSIS — M069 Rheumatoid arthritis, unspecified: Secondary | ICD-10-CM

## 2024-04-18 DIAGNOSIS — I480 Paroxysmal atrial fibrillation: Secondary | ICD-10-CM

## 2024-04-18 DIAGNOSIS — E538 Deficiency of other specified B group vitamins: Secondary | ICD-10-CM

## 2024-04-18 DIAGNOSIS — F419 Anxiety disorder, unspecified: Secondary | ICD-10-CM

## 2024-04-18 DIAGNOSIS — H9193 Unspecified hearing loss, bilateral: Secondary | ICD-10-CM

## 2024-04-18 DIAGNOSIS — M47812 Spondylosis without myelopathy or radiculopathy, cervical region: Secondary | ICD-10-CM

## 2024-04-18 DIAGNOSIS — M4807 Spinal stenosis, lumbosacral region: Secondary | ICD-10-CM

## 2024-04-18 DIAGNOSIS — F32A Depression, unspecified: Secondary | ICD-10-CM

## 2024-04-18 DIAGNOSIS — E785 Hyperlipidemia, unspecified: Secondary | ICD-10-CM

## 2024-04-18 DIAGNOSIS — M4802 Spinal stenosis, cervical region: Secondary | ICD-10-CM

## 2024-04-18 DIAGNOSIS — Z7902 Long term (current) use of antithrombotics/antiplatelets: Secondary | ICD-10-CM

## 2024-04-21 ENCOUNTER — Other Ambulatory Visit: Payer: Self-pay | Admitting: Family

## 2024-04-21 ENCOUNTER — Ambulatory Visit: Payer: Self-pay | Admitting: Family

## 2024-04-21 DIAGNOSIS — D62 Acute posthemorrhagic anemia: Secondary | ICD-10-CM

## 2024-04-21 DIAGNOSIS — Z7901 Long term (current) use of anticoagulants: Secondary | ICD-10-CM

## 2024-04-21 DIAGNOSIS — M1180 Other specified crystal arthropathies, unspecified site: Secondary | ICD-10-CM | POA: Diagnosis not present

## 2024-04-21 DIAGNOSIS — D500 Iron deficiency anemia secondary to blood loss (chronic): Secondary | ICD-10-CM

## 2024-04-21 DIAGNOSIS — Z79899 Other long term (current) drug therapy: Secondary | ICD-10-CM | POA: Diagnosis not present

## 2024-04-23 NOTE — Progress Notes (Signed)
 noted

## 2024-04-24 ENCOUNTER — Inpatient Hospital Stay

## 2024-04-24 ENCOUNTER — Encounter: Payer: Self-pay | Admitting: Oncology

## 2024-04-24 ENCOUNTER — Inpatient Hospital Stay: Attending: Oncology | Admitting: Oncology

## 2024-04-24 VITALS — BP 159/60 | HR 60 | Temp 98.7°F | Resp 20 | Wt 174.0 lb

## 2024-04-24 DIAGNOSIS — M47812 Spondylosis without myelopathy or radiculopathy, cervical region: Secondary | ICD-10-CM | POA: Diagnosis not present

## 2024-04-24 DIAGNOSIS — Z803 Family history of malignant neoplasm of breast: Secondary | ICD-10-CM | POA: Diagnosis not present

## 2024-04-24 DIAGNOSIS — D509 Iron deficiency anemia, unspecified: Secondary | ICD-10-CM

## 2024-04-24 DIAGNOSIS — F419 Anxiety disorder, unspecified: Secondary | ICD-10-CM | POA: Diagnosis not present

## 2024-04-24 DIAGNOSIS — G4700 Insomnia, unspecified: Secondary | ICD-10-CM | POA: Diagnosis not present

## 2024-04-24 DIAGNOSIS — E039 Hypothyroidism, unspecified: Secondary | ICD-10-CM | POA: Diagnosis not present

## 2024-04-24 DIAGNOSIS — F32A Depression, unspecified: Secondary | ICD-10-CM | POA: Diagnosis not present

## 2024-04-24 DIAGNOSIS — H9193 Unspecified hearing loss, bilateral: Secondary | ICD-10-CM | POA: Diagnosis not present

## 2024-04-24 DIAGNOSIS — M1990 Unspecified osteoarthritis, unspecified site: Secondary | ICD-10-CM | POA: Diagnosis not present

## 2024-04-24 DIAGNOSIS — I10 Essential (primary) hypertension: Secondary | ICD-10-CM | POA: Diagnosis not present

## 2024-04-24 DIAGNOSIS — M4316 Spondylolisthesis, lumbar region: Secondary | ICD-10-CM | POA: Diagnosis not present

## 2024-04-24 DIAGNOSIS — D62 Acute posthemorrhagic anemia: Secondary | ICD-10-CM | POA: Diagnosis not present

## 2024-04-24 DIAGNOSIS — I480 Paroxysmal atrial fibrillation: Secondary | ICD-10-CM | POA: Diagnosis not present

## 2024-04-24 DIAGNOSIS — M1611 Unilateral primary osteoarthritis, right hip: Secondary | ICD-10-CM | POA: Diagnosis not present

## 2024-04-24 DIAGNOSIS — R1319 Other dysphagia: Secondary | ICD-10-CM | POA: Diagnosis not present

## 2024-04-24 DIAGNOSIS — G894 Chronic pain syndrome: Secondary | ICD-10-CM | POA: Diagnosis not present

## 2024-04-24 DIAGNOSIS — Z87891 Personal history of nicotine dependence: Secondary | ICD-10-CM | POA: Insufficient documentation

## 2024-04-24 DIAGNOSIS — I739 Peripheral vascular disease, unspecified: Secondary | ICD-10-CM | POA: Diagnosis not present

## 2024-04-24 DIAGNOSIS — E785 Hyperlipidemia, unspecified: Secondary | ICD-10-CM | POA: Diagnosis not present

## 2024-04-24 DIAGNOSIS — M519 Unspecified thoracic, thoracolumbar and lumbosacral intervertebral disc disorder: Secondary | ICD-10-CM | POA: Diagnosis not present

## 2024-04-24 DIAGNOSIS — M4802 Spinal stenosis, cervical region: Secondary | ICD-10-CM | POA: Diagnosis not present

## 2024-04-24 DIAGNOSIS — M4807 Spinal stenosis, lumbosacral region: Secondary | ICD-10-CM | POA: Diagnosis not present

## 2024-04-24 DIAGNOSIS — E538 Deficiency of other specified B group vitamins: Secondary | ICD-10-CM | POA: Diagnosis not present

## 2024-04-24 DIAGNOSIS — M47817 Spondylosis without myelopathy or radiculopathy, lumbosacral region: Secondary | ICD-10-CM | POA: Diagnosis not present

## 2024-04-24 DIAGNOSIS — E66812 Obesity, class 2: Secondary | ICD-10-CM | POA: Diagnosis not present

## 2024-04-24 DIAGNOSIS — M069 Rheumatoid arthritis, unspecified: Secondary | ICD-10-CM | POA: Diagnosis not present

## 2024-04-24 DIAGNOSIS — I2510 Atherosclerotic heart disease of native coronary artery without angina pectoris: Secondary | ICD-10-CM | POA: Diagnosis not present

## 2024-04-24 DIAGNOSIS — I83813 Varicose veins of bilateral lower extremities with pain: Secondary | ICD-10-CM | POA: Diagnosis not present

## 2024-04-24 LAB — CBC WITH DIFFERENTIAL/PLATELET
Abs Immature Granulocytes: 0.01 K/uL (ref 0.00–0.07)
Basophils Absolute: 0 K/uL (ref 0.0–0.1)
Basophils Relative: 0 %
Eosinophils Absolute: 0.2 K/uL (ref 0.0–0.5)
Eosinophils Relative: 4 %
HCT: 28.3 % — ABNORMAL LOW (ref 36.0–46.0)
Hemoglobin: 8.9 g/dL — ABNORMAL LOW (ref 12.0–15.0)
Immature Granulocytes: 0 %
Lymphocytes Relative: 22 %
Lymphs Abs: 1 K/uL (ref 0.7–4.0)
MCH: 28 pg (ref 26.0–34.0)
MCHC: 31.4 g/dL (ref 30.0–36.0)
MCV: 89 fL (ref 80.0–100.0)
Monocytes Absolute: 0.7 K/uL (ref 0.1–1.0)
Monocytes Relative: 16 %
Neutro Abs: 2.6 K/uL (ref 1.7–7.7)
Neutrophils Relative %: 58 %
Platelets: 193 K/uL (ref 150–400)
RBC: 3.18 MIL/uL — ABNORMAL LOW (ref 3.87–5.11)
RDW: 15.6 % — ABNORMAL HIGH (ref 11.5–15.5)
WBC: 4.6 K/uL (ref 4.0–10.5)
nRBC: 0 % (ref 0.0–0.2)

## 2024-04-24 LAB — RETIC PANEL
Immature Retic Fract: 23.2 % — ABNORMAL HIGH (ref 2.3–15.9)
RBC.: 3.19 MIL/uL — ABNORMAL LOW (ref 3.87–5.11)
Retic Count, Absolute: 133 K/uL (ref 19.0–186.0)
Retic Ct Pct: 4.2 % — ABNORMAL HIGH (ref 0.4–3.1)
Reticulocyte Hemoglobin: 33.6 pg (ref >27.9)

## 2024-04-24 LAB — IRON AND TIBC
Iron: 181 ug/dL — ABNORMAL HIGH (ref 28–170)
Saturation Ratios: 54 % — ABNORMAL HIGH (ref 10.4–31.8)
TIBC: 333 ug/dL (ref 250–450)
UIBC: 152 ug/dL

## 2024-04-24 LAB — FERRITIN: Ferritin: 34 ng/mL (ref 11–307)

## 2024-04-24 NOTE — Progress Notes (Signed)
 Hematology/Oncology Progress note [phone] [phone]           REFERRING PROVIDER: Corwin Antu, FNP   CHIEF COMPLAINTS/REASON FOR VISIT:  Evaluation of anemia    ASSESSMENT & PLAN:   Iron  deficiency anemia Labs are reviewed and discussed with patient. Lab Results  Component Value Date   HGB 8.9 (L) 04/24/2024   TIBC 333 04/24/2024   IRONPCTSAT 54 (H) 04/24/2024   FERRITIN 34 04/24/2024    Hemoglobin has improved since she started on oral iron  supplementation.   Today's hemoglobin is 8.9. One option is to continue oral iron  supplementation and repeat CBC in 6 weeks. Alternatively IV Venofer could be considered Rationale potential side effects were reviewed.   Orders Placed This Encounter  Procedures   Ferritin    Standing Status:   Future    Number of Occurrences:   1    Expected Date:   04/24/2024    Expiration Date:   07/23/2024   Iron  and TIBC    Standing Status:   Future    Number of Occurrences:   1    Expected Date:   04/24/2024    Expiration Date:   07/23/2024   CBC with Differential/Platelet    Standing Status:   Future    Number of Occurrences:   1    Expected Date:   04/24/2024    Expiration Date:   07/23/2024   Retic Panel    Standing Status:   Future    Number of Occurrences:   1    Expected Date:   04/24/2024    Expiration Date:   07/23/2024    All questions were answered. The patient knows to call the clinic with any problems, questions or concerns.  [name], MD, PhD [facility] Hematology Oncology 04/24/2024   HISTORY OF PRESENTING ILLNESS:   Carmen [name] is a  83 y.o.  female with PMH listed below was seen in consultation at the request of  Corwin Antu, FNP  for evaluation of anemia   Discussed the use of AI scribe software for clinical note transcription with the patient, who gave verbal consent to proceed.   She was diagnosed with anemia following a fall on 04/01/2024  which resulted in a significant head  injury and subsequent blood loss. Hb was 11.9 on 03/28/2024 Patient had a large laceration and a profuse bleeding, hemoglobin dropped to 7.6 necessitating a blood transfusion. Her hemoglobin has since increased to 8.8 over the past three weeks.  She started taking oral iron  supplements on 04/19/2024  after a blood test showed her hemoglobin was 8.9. She takes the iron  supplement once daily in the morning and experiences some nausea as  side effect.  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 She is currently on Plavix  and temporarily on aspirin . No history of blood in the stool or feeling excessively tired prior to the fall.  She has a history of H. pylori infection, which was treated before March 1st, 2025. She also experiences constipation, which is managed with Miralax  and a diet rich in vegetables.   Patient reports feeling tired and fatigued.  She denies blood in the stool.   MEDICAL HISTORY:  Past Medical History:  Diagnosis Date   Abnormal CT scan, lumbar spine (05/11/2021) 05/17/2021   (05/11/2021) LUMBAR CT FINDINGS: Alignment: Lumbar levocurvature, apex L4. Mild lateral listhesis L4 on L5 of  approximately 3 mm. Vertebrae: Remote appearing superior endplate deformities at T12 with 10% height loss and L1 with up to 20% height loss. Multilevel discogenic and facet degenerative changes. Mild bilateral SI joint arthrosis.  DISC LEVELS: T11-T12: Near complete disc height loss with de   Abnormal MRI, cervical spine (05/12/2021) 05/17/2021   (05/12/2021) CERVICAL MRI FINDINGS: Motion artifact is present. Posterior Fossa, vertebral arteries, paraspinal tissues: Left superior cerebellar infarct.   DISC LEVELS: C2-C3: Disc bulge with endplate osteophytes. Uncovertebral and facet hypertrophy. C3-C4: Disc bulge with endplate osteophytes. Uncovertebral and facet hypertrophy. Mild canal  stenosis. Marked foraminal stenosis. C4-C5: Disc bulge w   Allergy    Anemia    Anxiety    Arthritis    Back pain    Coronary artery disease    Depression    History of stroke 10/02/2020   Hypertension    Peripheral vascular disease (HCC)    Personal history of nicotine dependence 10/02/2020   Prsnl hx of TIA (TIA), and cereb infrc w/o resid deficits 10/02/2020   Stroke (HCC)    Thyroid  disease     SURGICAL HISTORY: Past Surgical History:  Procedure Laterality Date   back injections     CARDIAC CATHETERIZATION     CATARACT EXTRACTION     CORONARY ANGIOPLASTY     CORONARY ARTERY BYPASS GRAFT     2005   LEFT ATRIAL APPENDAGE OCCLUSION N/A 12/28/2022   Procedure: LEFT ATRIAL APPENDAGE OCCLUSION;  Surgeon: Cindie Ole DASEN, MD;  Location: [facility] INVASIVE CV LAB;  Service: Cardiovascular;  Laterality: N/A;   TEE WITHOUT CARDIOVERSION N/A 12/28/2022   Procedure: TRANSESOPHAGEAL ECHOCARDIOGRAM;  Surgeon: Cindie Ole DASEN, MD;  Location: [facility] INVASIVE CV LAB;  Service: Cardiovascular;  Laterality: N/A;    SOCIAL HISTORY: Social History   Socioeconomic History   Marital status: Widowed    Spouse name: Not on file   Number of children: 3   Years of education: Not on file   Highest education level: Not on file  Occupational History   Not on file  Tobacco Use   Smoking status: Former    Current packs/day: 0.00    Types: Cigarettes    Start date: 05/1990    Quit date: 05/2020    Years since quitting: 3.9   Smokeless tobacco: Never   Tobacco comments:    Smoked about 1 pack per month  Vaping Use   Vaping status: Never Used  Substance and Sexual Activity   Alcohol use: Not Currently   Drug use: Yes    Types: Hydrocodone    Sexual activity: Not Currently    Birth control/protection: None  Other Topics Concern   Not on file  Social History Narrative   ** Merged History Encounter **       Social Drivers of Health   Financial Resource Strain: Low Risk  (08/20/2023)    Overall Financial Resource Strain (CARDIA)    Difficulty of Paying Living Expenses: Not hard at all  Food Insecurity: No Food Insecurity (04/24/2024)   Hunger Vital Sign    Worried About Running Out of Food in the Last Year: Never true    Ran Out of Food in the Last Year: Never true  Transportation Needs: No Transportation Needs (04/24/2024)   PRAPARE - Administrator, Civil Service (Medical): No    Lack of Transportation (Non-Medical): No  Physical Activity: Insufficiently Active (08/20/2023)   Exercise Vital Sign    Days of Exercise per Week: 7 days  Minutes of Exercise per Session: 20 min  Stress: No Stress Concern Present (08/20/2023)   [employer] of Occupational Health - Occupational Stress Questionnaire    Feeling of Stress : Not at all  Social Connections: Moderately Isolated (04/02/2024)   Social Connection and Isolation Panel    Frequency of Communication with Friends and Family: More than three times a week    Frequency of Social Gatherings with Friends and Family: More than three times a week    Attends Religious Services: 1 to 4 times per year    Active Member of [employer] or Organizations: No    Attends Banker Meetings: Never    Marital Status: Widowed  Intimate Partner Violence: Not At Risk (04/24/2024)   Humiliation, Afraid, Rape, and Kick questionnaire    Fear of Current or Ex-Partner: No    Emotionally Abused: No    Physically Abused: No    Sexually Abused: No    FAMILY HISTORY: Family History  Problem Relation Age of Onset   Breast cancer Mother    Heart attack Father    Bipolar disorder Daughter    Diabetes Mellitus II Neg Hx     ALLERGIES:  has no known allergies.  MEDICATIONS:  Current Outpatient Medications  Medication Sig Dispense Refill   aspirin  EC 81 MG tablet Take 81 mg by mouth daily. Swallow whole.     atorvastatin  (LIPITOR) 40 MG tablet Take 1 tablet (40 mg total) by mouth at bedtime. 90 tablet 3   BIOFREEZE  ROLL-ON 4 % GEL Apply 1 Application topically 3 (three) times daily.     CALCIUM  PO Take 1 tablet by mouth daily.     Cholecalciferol (VITAMIN D -3) 25 MCG (1000 UT) CAPS Take 1,000 Units by mouth daily.     clopidogrel  (PLAVIX ) 75 MG tablet Take 1 tablet (75 mg total) by mouth daily. 90 tablet 3   cyanocobalamin  (VITAMIN B12) 500 MCG tablet Take 500 mcg by mouth daily.     diphenhydrAMINE (BENADRYL) 25 MG tablet Take 25 mg by mouth at bedtime as needed for allergies.     divalproex  (DEPAKOTE ) 500 MG DR tablet Take 1 tablet (500 mg total) by mouth at bedtime. 90 tablet 3   [START ON 05/01/2024] HYDROcodone -acetaminophen  (NORCO/VICODIN) 5-325 MG tablet Take 1 tablet by mouth 2 (two) times daily as needed for severe pain (pain score 7-10). Must last 30 days. 60 tablet 0   [START ON 05/31/2024] HYDROcodone -acetaminophen  (NORCO/VICODIN) 5-325 MG tablet Take 1 tablet by mouth 2 (two) times daily as needed for severe pain (pain score 7-10). Must last 30 days. 60 tablet 0   [START ON 06/30/2024] HYDROcodone -acetaminophen  (NORCO/VICODIN) 5-325 MG tablet Take 1 tablet by mouth 2 (two) times daily as needed for severe pain (pain score 7-10). Must last 30 days. 60 tablet 0   hydroxychloroquine  (PLAQUENIL ) 200 MG tablet Take 200 mg by mouth 2 (two) times daily.     Iron , Ferrous Sulfate , 325 (65 Fe) MG TABS Take 325 mg by mouth daily. 30 tablet 0   levothyroxine  (SYNTHROID ) 137 MCG tablet Take 1 tablet (137 mcg total) by mouth daily before breakfast. 90 tablet 3   loratadine  (EQ ALL DAY ALLERGY RELIEF) 10 MG tablet Take 1 tablet by mouth once daily 30 tablet 5   losartan  (COZAAR ) 25 MG tablet Take 1/2 (one-half) tablet by mouth once daily 45 tablet 1   magnesium  oxide (MAG-OX) 400 (240 Mg) MG tablet Take 400 mg by mouth daily.  metoprolol  succinate (TOPROL -XL) 50 MG 24 hr tablet Take 50 mg by mouth daily. Take with or immediately following a meal.     naloxone  (NARCAN ) nasal spray 4 mg/0.1 mL Place 1 spray into  the nose as needed for up to 365 doses (for opioid-induced respiratory depresssion). In case of emergency (overdose), spray once into each nostril. If no response within 3 minutes, repeat application and call 911. 1 each 0   polyethylene glycol (MIRALAX  / GLYCOLAX ) 17 g packet Take 17 g by mouth daily as needed. 14 each 0   traZODone  (DESYREL ) 100 MG tablet TAKE 1 TABLET BY MOUTH AT BEDTIME AS NEEDED FOR SLEEP 90 tablet 0   famotidine  (PEPCID  AC) 10 MG tablet Take 10 mg by mouth daily as needed for heartburn or indigestion. (Patient not taking: Reported on 04/24/2024)     furosemide  (LASIX ) 20 MG tablet Take 1 tablet (20 mg total) by mouth daily as needed (for swelling). (Patient not taking: Reported on 04/24/2024) 90 tablet 3   pantoprazole  (PROTONIX ) 40 MG tablet TAKE 1 TABLET BY MOUTH TWICE A DAY (Patient not taking: Reported on 04/24/2024) 180 tablet 1   No current facility-administered medications for this visit.    Review of Systems  Constitutional:  Positive for fatigue. Negative for appetite change, chills and fever.  HENT:   Negative for hearing loss and voice change.   Eyes:  Negative for eye problems.  Respiratory:  Negative for chest tightness and cough.   Cardiovascular:  Negative for chest pain.  Gastrointestinal:  Negative for abdominal distention, abdominal pain and blood in stool.  Endocrine: Negative for hot flashes.  Genitourinary:  Negative for difficulty urinating and frequency.   Musculoskeletal:  Negative for arthralgias.  Skin:  Negative for itching and rash.  Neurological:  Negative for extremity weakness.  Hematological:  Negative for adenopathy.  Psychiatric/Behavioral:  Negative for confusion.    PHYSICAL EXAMINATION:  Vitals:   04/24/24 1513  BP: (!) 159/60  Pulse: 60  Resp: 20  Temp: 98.7 F (37.1 C)  SpO2: 100%   Filed Weights   04/24/24 1513  Weight: 174 lb (78.9 kg)    Physical Exam Constitutional:      General: She is not in acute  distress. HENT:     Head: Normocephalic and atraumatic.  Eyes:     General: No scleral icterus. Cardiovascular:     Rate and Rhythm: Normal rate.  Pulmonary:     Effort: Pulmonary effort is normal. No respiratory distress.     Breath sounds: No wheezing.  Abdominal:     General: Bowel sounds are normal. There is no distension.     Palpations: Abdomen is soft.  Musculoskeletal:        General: No deformity. Normal range of motion.     Cervical back: Normal range of motion and neck supple.  Skin:    Coloration: Skin is pale.     Findings: No erythema or rash.  Neurological:     Mental Status: She is alert and oriented to person, place, and time. Mental status is at baseline.  Psychiatric:        Mood and Affect: Mood normal.     LABORATORY DATA:  I have reviewed the data as listed    Latest Ref Rng & Units 04/24/2024    3:35 PM 04/17/2024    9:00 AM 04/10/2024    1:28 PM  CBC  WBC 4.0 - 10.5 K/uL 4.6  4.7  4.1   Hemoglobin  12.0 - 15.0 g/dL 8.9  8.8 Repeated and verified X2.  8.9  C  Hematocrit 36.0 - 46.0 % 28.3  26.9 Repeated and verified X2.  27.4   Platelets 150 - 400 K/uL 193  222.0  210.0     C Corrected result      Latest Ref Rng & Units 04/10/2024    1:28 PM 04/02/2024    5:26 AM 04/01/2024    2:18 PM  CMP  Glucose 70 - 99 mg/dL 897  97  878   BUN 6 - 23 mg/dL 19  15  19    Creatinine 0.40 - 1.20 mg/dL 9.29  9.17  9.09   Sodium 135 - 145 mEq/L 137  139  139   Potassium 3.5 - 5.1 mEq/L 4.1  4.5  4.3   Chloride 96 - 112 mEq/L 103  105  103   CO2 19 - 32 mEq/L 28  26    Calcium  8.4 - 10.5 mg/dL 8.9  8.8    Total Protein 6.5 - 8.1 g/dL  5.2    Total Bilirubin 0.0 - 1.2 mg/dL  0.5    Alkaline Phos 38 - 126 U/L  41    AST 15 - 41 U/L  21    ALT 0 - 44 U/L  17        RADIOGRAPHIC STUDIES: I have personally reviewed the radiological images as listed and agreed with the findings in the report. CT HEAD WO CONTRAST Result Date: 04/01/2024 CLINICAL DATA:  Head  trauma, neck trauma, fall. EXAM: CT HEAD WITHOUT CONTRAST CT CERVICAL SPINE WITHOUT CONTRAST TECHNIQUE: Multidetector CT imaging of the head and cervical spine was performed following the standard protocol without intravenous contrast. Multiplanar CT image reconstructions of the cervical spine were also generated. RADIATION DOSE REDUCTION: This exam was performed according to the departmental dose-optimization program which includes automated exposure control, adjustment of the mA and/or kV according to patient size and/or use of iterative reconstruction technique. COMPARISON:  12/18/2023. FINDINGS: CT HEAD FINDINGS Brain: No acute intracranial hemorrhage. No CT evidence of acute infarct. Redemonstrated remote infarct in the parasagittal right frontal lobe involving the right ACA territory. Additional remote infarct in the left cerebellum. Nonspecific hypoattenuation in the periventricular and subcortical white matter favored to reflect chronic microvascular ischemic changes. Mild parenchymal volume loss. No edema, mass effect, or midline shift. The basilar cisterns are patent. Ventricles: The ventricles are normal. Vascular: Atherosclerotic calcifications of the carotid siphons. No hyperdense vessel. Skull: No acute or aggressive finding. Orbits: Bilateral lens replacement. Sinuses: The visualized paranasal sinuses are clear. Other: Mastoid air cells are clear. Soft tissue swelling in the left parieto-occipital scalp. CT CERVICAL SPINE FINDINGS Alignment: Straightening of the normal cervical lordosis. No listhesis. No facet subluxation or dislocation. Skull base and vertebrae: No acute fracture. No primary bone lesion or focal pathologic process. Soft tissues and spinal canal: No prevertebral fluid or swelling. No visible canal hematoma. Disc levels: Intervertebral disc space narrowing throughout the cervical spine. Disc osteophyte complex at C3-4 resulting in mild spinal canal stenosis. Additional disc osteophyte  complex at C4-5 slightly eccentric to the right resulting in mild spinal canal stenosis. Disc osteophyte complex at C5-6 resulting in mild spinal canal stenosis. Facet arthrosis and uncovertebral hypertrophy at multiple levels. Moderate foraminal stenosis throughout the cervical spine. Foraminal stenosis most pronounced on the left at C3-4. Upper chest: No acute finding. Other: None. IMPRESSION: No CT evidence of acute intracranial abnormality. No acute fracture or traumatic malalignment of  cervical spine. Soft tissue swelling in the left parieto-occipital scalp. 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 Degenerative changes as above. Electronically Signed   By: Donnice Mania M.D.   On: 04/01/2024 15:24   CT CERVICAL SPINE WO CONTRAST Result Date: 04/01/2024 CLINICAL DATA:  Head trauma, neck trauma, fall. EXAM: CT HEAD WITHOUT CONTRAST CT CERVICAL SPINE WITHOUT CONTRAST TECHNIQUE: Multidetector CT imaging of the head and cervical spine was performed following the standard protocol without intravenous contrast. Multiplanar CT image reconstructions of the cervical spine were also generated. RADIATION DOSE REDUCTION: This exam was performed according to the departmental dose-optimization program which includes automated exposure control, adjustment of the mA and/or kV according to patient size and/or use of iterative reconstruction technique. COMPARISON:  12/18/2023. FINDINGS: CT HEAD FINDINGS Brain: No acute intracranial hemorrhage. No CT evidence of acute infarct. Redemonstrated remote infarct in the parasagittal right frontal lobe involving the right ACA territory. Additional remote infarct in the left cerebellum. Nonspecific hypoattenuation in the periventricular and subcortical white matter favored to reflect chronic microvascular ischemic changes. Mild parenchymal volume loss. No edema, mass effect, or midline shift.  The basilar cisterns are patent. Ventricles: The ventricles are normal. Vascular: Atherosclerotic calcifications of the carotid siphons. No hyperdense vessel. Skull: No acute or aggressive finding. Orbits: Bilateral lens replacement. Sinuses: The visualized paranasal sinuses are clear. Other: Mastoid air cells are clear. Soft tissue swelling in the left parieto-occipital scalp. CT CERVICAL SPINE FINDINGS Alignment: Straightening of the normal cervical lordosis. No listhesis. No facet subluxation or dislocation. Skull base and vertebrae: No acute fracture. No primary bone lesion or focal pathologic process. Soft tissues and spinal canal: No prevertebral fluid or swelling. No visible canal hematoma. Disc levels: Intervertebral disc space narrowing throughout the cervical spine. Disc osteophyte complex at C3-4 resulting in mild spinal canal stenosis. Additional disc osteophyte complex at C4-5 slightly eccentric to the right resulting in mild spinal canal stenosis. Disc osteophyte complex at C5-6 resulting in mild spinal canal stenosis. Facet arthrosis and uncovertebral hypertrophy at multiple levels. Moderate foraminal stenosis throughout the cervical spine. Foraminal stenosis most pronounced on the left at C3-4. Upper chest: No acute finding. Other: None. IMPRESSION: No CT evidence of acute intracranial abnormality. No acute fracture or traumatic malalignment of cervical spine. Soft tissue swelling in the left parieto-occipital scalp. 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 Degenerative changes as above. Electronically Signed   By: Donnice Mania M.D.   On: 04/01/2024 15:24   DG Pelvis Portable Result Date: 04/01/2024 CLINICAL DATA:  Fall. EXAM: PORTABLE PELVIS 1-2 VIEWS COMPARISON:  12/18/2023. FINDINGS: There is no evidence of pelvic fracture or diastasis. Femoral heads are seated within the acetabula. Mild degenerative  changes of the bilateral hips. Sacroiliac joints and pubic symphysis are anatomically aligned. IMPRESSION: No acute osseous abnormality. Electronically Signed   By: Harrietta Sherry M.D.   On: 04/01/2024 14:40   DG Chest Port 1 View Result Date: 04/01/2024 CLINICAL DATA:  Fall. EXAM: PORTABLE CHEST 1 VIEW COMPARISON:  12/18/2023. FINDINGS: Stable cardiomegaly. Prior median sternotomy and CABG. No focal consolidation, pleural effusion, or pneumothorax. Calcified granuloma again noted at the right lateral lung base. No acute osseous abnormality. IMPRESSION: No acute findings in the chest. Electronically Signed   By: Harrietta Sherry M.D.   On: 04/01/2024 14:40

## 2024-04-24 NOTE — Assessment & Plan Note (Signed)
 Labs are reviewed and discussed with patient. Lab Results  Component Value Date   HGB 8.9 (L) 04/24/2024   TIBC 333 04/24/2024   IRONPCTSAT 54 (H) 04/24/2024   FERRITIN 34 04/24/2024    Hemoglobin has improved since she started on oral iron  supplementation.   Today's hemoglobin is 8.9. One option is to continue oral iron  supplementation and repeat CBC in 6 weeks. Alternatively IV Venofer could be considered Rationale potential side effects were reviewed.

## 2024-04-25 ENCOUNTER — Ambulatory Visit: Payer: Self-pay | Admitting: Oncology

## 2024-05-02 DIAGNOSIS — M47817 Spondylosis without myelopathy or radiculopathy, lumbosacral region: Secondary | ICD-10-CM | POA: Diagnosis not present

## 2024-05-02 DIAGNOSIS — H9193 Unspecified hearing loss, bilateral: Secondary | ICD-10-CM | POA: Diagnosis not present

## 2024-05-02 DIAGNOSIS — M069 Rheumatoid arthritis, unspecified: Secondary | ICD-10-CM | POA: Diagnosis not present

## 2024-05-02 DIAGNOSIS — M519 Unspecified thoracic, thoracolumbar and lumbosacral intervertebral disc disorder: Secondary | ICD-10-CM | POA: Diagnosis not present

## 2024-05-02 DIAGNOSIS — I480 Paroxysmal atrial fibrillation: Secondary | ICD-10-CM | POA: Diagnosis not present

## 2024-05-02 DIAGNOSIS — I739 Peripheral vascular disease, unspecified: Secondary | ICD-10-CM | POA: Diagnosis not present

## 2024-05-02 DIAGNOSIS — E039 Hypothyroidism, unspecified: Secondary | ICD-10-CM | POA: Diagnosis not present

## 2024-05-02 DIAGNOSIS — E538 Deficiency of other specified B group vitamins: Secondary | ICD-10-CM | POA: Diagnosis not present

## 2024-05-02 DIAGNOSIS — G4700 Insomnia, unspecified: Secondary | ICD-10-CM | POA: Diagnosis not present

## 2024-05-02 DIAGNOSIS — M4802 Spinal stenosis, cervical region: Secondary | ICD-10-CM | POA: Diagnosis not present

## 2024-05-02 DIAGNOSIS — R1319 Other dysphagia: Secondary | ICD-10-CM | POA: Diagnosis not present

## 2024-05-02 DIAGNOSIS — E66812 Obesity, class 2: Secondary | ICD-10-CM | POA: Diagnosis not present

## 2024-05-02 DIAGNOSIS — F32A Depression, unspecified: Secondary | ICD-10-CM | POA: Diagnosis not present

## 2024-05-02 DIAGNOSIS — F419 Anxiety disorder, unspecified: Secondary | ICD-10-CM | POA: Diagnosis not present

## 2024-05-02 DIAGNOSIS — I2510 Atherosclerotic heart disease of native coronary artery without angina pectoris: Secondary | ICD-10-CM | POA: Diagnosis not present

## 2024-05-02 DIAGNOSIS — M4316 Spondylolisthesis, lumbar region: Secondary | ICD-10-CM | POA: Diagnosis not present

## 2024-05-02 DIAGNOSIS — I83813 Varicose veins of bilateral lower extremities with pain: Secondary | ICD-10-CM | POA: Diagnosis not present

## 2024-05-02 DIAGNOSIS — M4807 Spinal stenosis, lumbosacral region: Secondary | ICD-10-CM | POA: Diagnosis not present

## 2024-05-02 DIAGNOSIS — D62 Acute posthemorrhagic anemia: Secondary | ICD-10-CM | POA: Diagnosis not present

## 2024-05-02 DIAGNOSIS — I10 Essential (primary) hypertension: Secondary | ICD-10-CM | POA: Diagnosis not present

## 2024-05-02 DIAGNOSIS — M1990 Unspecified osteoarthritis, unspecified site: Secondary | ICD-10-CM | POA: Diagnosis not present

## 2024-05-02 DIAGNOSIS — M47812 Spondylosis without myelopathy or radiculopathy, cervical region: Secondary | ICD-10-CM | POA: Diagnosis not present

## 2024-05-02 DIAGNOSIS — E785 Hyperlipidemia, unspecified: Secondary | ICD-10-CM | POA: Diagnosis not present

## 2024-05-02 DIAGNOSIS — M1611 Unilateral primary osteoarthritis, right hip: Secondary | ICD-10-CM | POA: Diagnosis not present

## 2024-05-02 DIAGNOSIS — G894 Chronic pain syndrome: Secondary | ICD-10-CM | POA: Diagnosis not present

## 2024-05-06 DIAGNOSIS — I4821 Permanent atrial fibrillation: Secondary | ICD-10-CM | POA: Diagnosis not present

## 2024-05-07 ENCOUNTER — Encounter: Payer: Self-pay | Admitting: Internal Medicine

## 2024-05-07 ENCOUNTER — Ambulatory Visit: Attending: Internal Medicine | Admitting: Internal Medicine

## 2024-05-07 VITALS — BP 136/80 | HR 63 | Ht 60.0 in | Wt 173.0 lb

## 2024-05-07 DIAGNOSIS — I639 Cerebral infarction, unspecified: Secondary | ICD-10-CM

## 2024-05-07 DIAGNOSIS — M4802 Spinal stenosis, cervical region: Secondary | ICD-10-CM | POA: Diagnosis not present

## 2024-05-07 DIAGNOSIS — M4807 Spinal stenosis, lumbosacral region: Secondary | ICD-10-CM | POA: Diagnosis not present

## 2024-05-07 DIAGNOSIS — I10 Essential (primary) hypertension: Secondary | ICD-10-CM | POA: Diagnosis not present

## 2024-05-07 DIAGNOSIS — I480 Paroxysmal atrial fibrillation: Secondary | ICD-10-CM | POA: Diagnosis not present

## 2024-05-07 DIAGNOSIS — F419 Anxiety disorder, unspecified: Secondary | ICD-10-CM | POA: Diagnosis not present

## 2024-05-07 DIAGNOSIS — E785 Hyperlipidemia, unspecified: Secondary | ICD-10-CM

## 2024-05-07 DIAGNOSIS — M519 Unspecified thoracic, thoracolumbar and lumbosacral intervertebral disc disorder: Secondary | ICD-10-CM | POA: Diagnosis not present

## 2024-05-07 DIAGNOSIS — I83813 Varicose veins of bilateral lower extremities with pain: Secondary | ICD-10-CM | POA: Diagnosis not present

## 2024-05-07 DIAGNOSIS — R1319 Other dysphagia: Secondary | ICD-10-CM | POA: Diagnosis not present

## 2024-05-07 DIAGNOSIS — E039 Hypothyroidism, unspecified: Secondary | ICD-10-CM | POA: Diagnosis not present

## 2024-05-07 DIAGNOSIS — E538 Deficiency of other specified B group vitamins: Secondary | ICD-10-CM | POA: Diagnosis not present

## 2024-05-07 DIAGNOSIS — E66812 Obesity, class 2: Secondary | ICD-10-CM | POA: Diagnosis not present

## 2024-05-07 DIAGNOSIS — M069 Rheumatoid arthritis, unspecified: Secondary | ICD-10-CM | POA: Diagnosis not present

## 2024-05-07 DIAGNOSIS — I4821 Permanent atrial fibrillation: Secondary | ICD-10-CM

## 2024-05-07 DIAGNOSIS — I739 Peripheral vascular disease, unspecified: Secondary | ICD-10-CM | POA: Diagnosis not present

## 2024-05-07 DIAGNOSIS — G894 Chronic pain syndrome: Secondary | ICD-10-CM | POA: Diagnosis not present

## 2024-05-07 DIAGNOSIS — M47817 Spondylosis without myelopathy or radiculopathy, lumbosacral region: Secondary | ICD-10-CM | POA: Diagnosis not present

## 2024-05-07 DIAGNOSIS — D62 Acute posthemorrhagic anemia: Secondary | ICD-10-CM | POA: Diagnosis not present

## 2024-05-07 DIAGNOSIS — M1990 Unspecified osteoarthritis, unspecified site: Secondary | ICD-10-CM | POA: Diagnosis not present

## 2024-05-07 DIAGNOSIS — I4819 Other persistent atrial fibrillation: Secondary | ICD-10-CM | POA: Diagnosis not present

## 2024-05-07 DIAGNOSIS — M47812 Spondylosis without myelopathy or radiculopathy, cervical region: Secondary | ICD-10-CM | POA: Diagnosis not present

## 2024-05-07 DIAGNOSIS — M1611 Unilateral primary osteoarthritis, right hip: Secondary | ICD-10-CM | POA: Diagnosis not present

## 2024-05-07 DIAGNOSIS — M4316 Spondylolisthesis, lumbar region: Secondary | ICD-10-CM | POA: Diagnosis not present

## 2024-05-07 DIAGNOSIS — F32A Depression, unspecified: Secondary | ICD-10-CM | POA: Diagnosis not present

## 2024-05-07 DIAGNOSIS — I25810 Atherosclerosis of coronary artery bypass graft(s) without angina pectoris: Secondary | ICD-10-CM | POA: Diagnosis not present

## 2024-05-07 DIAGNOSIS — I2510 Atherosclerotic heart disease of native coronary artery without angina pectoris: Secondary | ICD-10-CM | POA: Diagnosis not present

## 2024-05-07 DIAGNOSIS — H9193 Unspecified hearing loss, bilateral: Secondary | ICD-10-CM | POA: Diagnosis not present

## 2024-05-07 DIAGNOSIS — G4700 Insomnia, unspecified: Secondary | ICD-10-CM | POA: Diagnosis not present

## 2024-05-07 NOTE — Patient Instructions (Signed)
 Medication Instructions:  Your physician recommends the following medication changes.  STOP TAKING: Aspirin    DECREASE: Metoprolol  12.5 mg by mouth daily   *If you need a refill on your cardiac medications before your next appointment, please call your pharmacy*  Lab Work: No labs ordered today    Testing/Procedures: No test ordered today   Follow-Up: At [facility], you and your health needs are our priority.  As part of our continuing mission to provide you with exceptional heart care, our providers are all part of one team.  This team includes your primary Cardiologist (physician) and Advanced Practice Providers or APPs (Physician Assistants and Nurse Practitioners) who all work together to provide you with the care you need, when you need it.  Your next appointment:   3 month(s)  Provider:   You may see Lonni Hanson, MD or one of the following Advanced Practice Providers on your designated Care Team:   Lonni Meager, NP Lesley Maffucci, PA-C Bernardino Bring, PA-C Cadence [place], PA-C Tylene Lunch, NP Barnie Hila, NP

## 2024-05-07 NOTE — Progress Notes (Unsigned)
 Cardiology Office Note:  .   Date:  05/08/2024  ID:  Carmen [name], DOB 03/23/1941, MRN [id number] PCP: Carmen Antu, FNP  [facility] Providers Cardiologist:  Lonni Hanson, MD Electrophysiologist:  OLE ONEIDA HOLTS, MD     History of Present Illness: .   Carmen [name] is a 83 y.o. female with history of coronary artery disease status post CABG (2005, LIMA-LAD, sequential SVG-D-OM, and SVG-PDA) and subsequent PCI due to failure of multiple grafts (Taxus stents to proximal LAD, mid LCx, and RCA), persistent atrial fibrillation status post left atrial appendage occlusion, stroke, PAD, hypertension, hyperlipidemia, hypothyroidism, anemia, chronic pain syndrome, and depression/anxiety, who presents for follow-up of atrial fibrillation and coronary artery disease.  I last saw her in late June, at which time she was feeling fairly well, having returned home from rehab after her hospitalization in March.  She noted some increased dyspnea on exertion as well as occasional dependent leg edema.  She was previously felt to have permanent atrial fibrillation, though EKG at that visit demonstrated sinus bradycardia with first-degree AV block.  We agreed to perform a 14-day event monitor, which showed predominantly sinus rhythm without evidence of atrial fibrillation.  Metoprolol  succinate was also cut in half to 25 mg daily at our last visit due to bradycardia and first-degree AV block.  Today, Carmen [name] reports that she has been feeling fairly well though she continues to have falls.  Since our last visit, she had a severe fall during which she struck her head on a piece of furniture and had significant bleeding that required a PRBC transfusion and suturing in the ED.  She has remained on DAPT despite prior recommendations by neurology to discontinue aspirin  after 3 weeks of therapy following her stroke in March.  She notes that her fall was mechanical in nature.  She has not had any lightheadedness or  syncope.  She also denies chest pain, shortness of breath, and palpitations.  Lower extremity edema has been minimal.  ROS: See HPI  Studies Reviewed: SABRA   EKG Interpretation Date/Time:  Wednesday May 07 2024 14:07:11 EDT Ventricular Rate:  63 PR Interval:  180 QRS Duration:  86 QT Interval:  446 QTC Calculation: 456 R Axis:   73  Text Interpretation: Normal sinus rhythm Nonspecific ST and T wave abnormality Abnormal ECG When compared with ECG of 01-Apr-2024 16:06, Nonspecific ST and T wave abnormality is slightly more pronounced Confirmed by [place] Lasseter, Lonni [phone]) on 05/08/2024 1:31:59 PM    14-day event monitor (03/28/2024): Predominantly sinus rhythm with occasional PACs (1.1% burden) and rare PVCs.  2 supraventricular runs lasting up to 8 beats noted as well as a single 6 beat run of NSVT.  No sustained arrhythmia, including atrial fibrillation, observed.  TTE (12/03/2023): Normal LV size with moderate LVH. LVEF 55-60% with indeterminate diastolic parameters. 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 Severe left atrial enlargement. Moderate MR. Normal CVP.   Risk Assessment/Calculations:    CHA2DS2-VASc Score = 7   This indicates a 11.2% annual risk of stroke. The patient's score is based upon: CHF History: 0 HTN History: 1 Diabetes History: 0 Stroke History: 2 Vascular Disease History: 1 Age Score: 2 Gender Score: 1            Physical Exam:   VS:  BP 136/80 (BP Location: Left Arm, Patient Position: Sitting, Cuff Size: Normal)   Pulse 63   Ht 5' (1.524 m)   Wt 173 lb (78.5  kg)   SpO2 96%   BMI 33.79 kg/m    Wt Readings from Last 3 Encounters:  05/07/24 173 lb (78.5 kg)  04/24/24 174 lb (78.9 kg)  04/15/24 174 lb (78.9 kg)    General:  NAD. Neck: No JVD or HJR. Lungs: Clear to auscultation bilaterally without wheezes or crackles. Heart: Regular rate and rhythm with 2/6 systolic murmur. Abdomen: Soft, nontender,  nondistended. Extremities: Trace ankle edema bilaterally.  ASSESSMENT AND PLAN: .    Persistent atrial fibrillation: Carmen [name] remains in sinus rhythm today, which was also noted on her recent monitor after having been in atrial fibrillation for a long period of time.  Defer anticoagulation in the setting of frequent falls and prior left atrial appendage occlusion.  Ventricular rates somewhat better today but overall still borderline low at times on recent event monitor.  We have therefore agreed to reduce metoprolol  succinate further from 25 mg daily to 12.5 mg daily.  Coronary artery disease: No angina reported.  Given prior Taxus stent implantation, we discussed risks and benefits of long-term DAPT and SAPT (including aspirin  versus clopidogrel ).  She had previously been on clopidogrel  monotherapy though aspirin  is added after her stroke in March.  Given her recurrent falls and recent severe bleeding from head laceration, we have agreed to discontinue aspirin  and continue with clopidogrel  monotherapy.  If bleeding/falls remain an issue, we may need to de-escalate further to low-dose aspirin .  Stroke: No new focal neurologic deficits noted since event in March.  As above, we will de-escalate DAPT to monotherapy with clopidogrel .  Continue atorvastatin  for secondary prevention.  Hyperlipidemia: LDL well-controlled on last check in March at 69.  Continue atorvastatin  40 mg daily.  Hypertension: Blood pressure borderline elevated today.  However, in the setting of recurrent falls, I would like to minimize the risk for symptomatic hypotension.  Will need to continue monitoring her blood pressure in the setting of further metoprolol  de-escalation today.    Dispo: Return to clinic in 3 months.  Signed, Lonni Hanson, MD

## 2024-05-08 ENCOUNTER — Encounter: Payer: Self-pay | Admitting: Internal Medicine

## 2024-05-09 ENCOUNTER — Other Ambulatory Visit: Payer: Self-pay | Admitting: Family

## 2024-05-09 DIAGNOSIS — D500 Iron deficiency anemia secondary to blood loss (chronic): Secondary | ICD-10-CM

## 2024-05-15 DIAGNOSIS — M4807 Spinal stenosis, lumbosacral region: Secondary | ICD-10-CM | POA: Diagnosis not present

## 2024-05-15 DIAGNOSIS — M4802 Spinal stenosis, cervical region: Secondary | ICD-10-CM | POA: Diagnosis not present

## 2024-05-15 DIAGNOSIS — I10 Essential (primary) hypertension: Secondary | ICD-10-CM | POA: Diagnosis not present

## 2024-05-15 DIAGNOSIS — G4700 Insomnia, unspecified: Secondary | ICD-10-CM | POA: Diagnosis not present

## 2024-05-15 DIAGNOSIS — M4316 Spondylolisthesis, lumbar region: Secondary | ICD-10-CM | POA: Diagnosis not present

## 2024-05-15 DIAGNOSIS — M47817 Spondylosis without myelopathy or radiculopathy, lumbosacral region: Secondary | ICD-10-CM | POA: Diagnosis not present

## 2024-05-15 DIAGNOSIS — G894 Chronic pain syndrome: Secondary | ICD-10-CM | POA: Diagnosis not present

## 2024-05-15 DIAGNOSIS — I2510 Atherosclerotic heart disease of native coronary artery without angina pectoris: Secondary | ICD-10-CM | POA: Diagnosis not present

## 2024-05-15 DIAGNOSIS — E039 Hypothyroidism, unspecified: Secondary | ICD-10-CM | POA: Diagnosis not present

## 2024-05-15 DIAGNOSIS — M1611 Unilateral primary osteoarthritis, right hip: Secondary | ICD-10-CM | POA: Diagnosis not present

## 2024-05-15 DIAGNOSIS — E538 Deficiency of other specified B group vitamins: Secondary | ICD-10-CM | POA: Diagnosis not present

## 2024-05-15 DIAGNOSIS — I739 Peripheral vascular disease, unspecified: Secondary | ICD-10-CM | POA: Diagnosis not present

## 2024-05-15 DIAGNOSIS — D62 Acute posthemorrhagic anemia: Secondary | ICD-10-CM | POA: Diagnosis not present

## 2024-05-15 DIAGNOSIS — I480 Paroxysmal atrial fibrillation: Secondary | ICD-10-CM | POA: Diagnosis not present

## 2024-05-15 DIAGNOSIS — E66812 Obesity, class 2: Secondary | ICD-10-CM | POA: Diagnosis not present

## 2024-05-15 DIAGNOSIS — R1319 Other dysphagia: Secondary | ICD-10-CM | POA: Diagnosis not present

## 2024-05-15 DIAGNOSIS — M1990 Unspecified osteoarthritis, unspecified site: Secondary | ICD-10-CM | POA: Diagnosis not present

## 2024-05-15 DIAGNOSIS — H9193 Unspecified hearing loss, bilateral: Secondary | ICD-10-CM | POA: Diagnosis not present

## 2024-05-15 DIAGNOSIS — F32A Depression, unspecified: Secondary | ICD-10-CM | POA: Diagnosis not present

## 2024-05-15 DIAGNOSIS — M47812 Spondylosis without myelopathy or radiculopathy, cervical region: Secondary | ICD-10-CM | POA: Diagnosis not present

## 2024-05-15 DIAGNOSIS — I83813 Varicose veins of bilateral lower extremities with pain: Secondary | ICD-10-CM | POA: Diagnosis not present

## 2024-05-15 DIAGNOSIS — E785 Hyperlipidemia, unspecified: Secondary | ICD-10-CM | POA: Diagnosis not present

## 2024-05-15 DIAGNOSIS — M519 Unspecified thoracic, thoracolumbar and lumbosacral intervertebral disc disorder: Secondary | ICD-10-CM | POA: Diagnosis not present

## 2024-05-15 DIAGNOSIS — M069 Rheumatoid arthritis, unspecified: Secondary | ICD-10-CM | POA: Diagnosis not present

## 2024-05-15 DIAGNOSIS — F419 Anxiety disorder, unspecified: Secondary | ICD-10-CM | POA: Diagnosis not present

## 2024-05-21 DIAGNOSIS — I2510 Atherosclerotic heart disease of native coronary artery without angina pectoris: Secondary | ICD-10-CM | POA: Diagnosis not present

## 2024-05-21 DIAGNOSIS — M47817 Spondylosis without myelopathy or radiculopathy, lumbosacral region: Secondary | ICD-10-CM | POA: Diagnosis not present

## 2024-05-21 DIAGNOSIS — F32A Depression, unspecified: Secondary | ICD-10-CM | POA: Diagnosis not present

## 2024-05-21 DIAGNOSIS — G894 Chronic pain syndrome: Secondary | ICD-10-CM | POA: Diagnosis not present

## 2024-05-21 DIAGNOSIS — E039 Hypothyroidism, unspecified: Secondary | ICD-10-CM | POA: Diagnosis not present

## 2024-05-21 DIAGNOSIS — M069 Rheumatoid arthritis, unspecified: Secondary | ICD-10-CM | POA: Diagnosis not present

## 2024-05-21 DIAGNOSIS — H9193 Unspecified hearing loss, bilateral: Secondary | ICD-10-CM | POA: Diagnosis not present

## 2024-05-21 DIAGNOSIS — I10 Essential (primary) hypertension: Secondary | ICD-10-CM | POA: Diagnosis not present

## 2024-05-21 DIAGNOSIS — M519 Unspecified thoracic, thoracolumbar and lumbosacral intervertebral disc disorder: Secondary | ICD-10-CM | POA: Diagnosis not present

## 2024-05-21 DIAGNOSIS — M4802 Spinal stenosis, cervical region: Secondary | ICD-10-CM | POA: Diagnosis not present

## 2024-05-21 DIAGNOSIS — I83813 Varicose veins of bilateral lower extremities with pain: Secondary | ICD-10-CM | POA: Diagnosis not present

## 2024-05-21 DIAGNOSIS — M1611 Unilateral primary osteoarthritis, right hip: Secondary | ICD-10-CM | POA: Diagnosis not present

## 2024-05-21 DIAGNOSIS — E538 Deficiency of other specified B group vitamins: Secondary | ICD-10-CM | POA: Diagnosis not present

## 2024-05-21 DIAGNOSIS — R1319 Other dysphagia: Secondary | ICD-10-CM | POA: Diagnosis not present

## 2024-05-21 DIAGNOSIS — E66812 Obesity, class 2: Secondary | ICD-10-CM | POA: Diagnosis not present

## 2024-05-21 DIAGNOSIS — M4807 Spinal stenosis, lumbosacral region: Secondary | ICD-10-CM | POA: Diagnosis not present

## 2024-05-21 DIAGNOSIS — I480 Paroxysmal atrial fibrillation: Secondary | ICD-10-CM | POA: Diagnosis not present

## 2024-05-21 DIAGNOSIS — F419 Anxiety disorder, unspecified: Secondary | ICD-10-CM | POA: Diagnosis not present

## 2024-05-21 DIAGNOSIS — M47812 Spondylosis without myelopathy or radiculopathy, cervical region: Secondary | ICD-10-CM | POA: Diagnosis not present

## 2024-05-21 DIAGNOSIS — G4700 Insomnia, unspecified: Secondary | ICD-10-CM | POA: Diagnosis not present

## 2024-05-21 DIAGNOSIS — D62 Acute posthemorrhagic anemia: Secondary | ICD-10-CM | POA: Diagnosis not present

## 2024-05-21 DIAGNOSIS — M4316 Spondylolisthesis, lumbar region: Secondary | ICD-10-CM | POA: Diagnosis not present

## 2024-05-21 DIAGNOSIS — M1990 Unspecified osteoarthritis, unspecified site: Secondary | ICD-10-CM | POA: Diagnosis not present

## 2024-05-21 DIAGNOSIS — E785 Hyperlipidemia, unspecified: Secondary | ICD-10-CM | POA: Diagnosis not present

## 2024-05-21 DIAGNOSIS — I739 Peripheral vascular disease, unspecified: Secondary | ICD-10-CM | POA: Diagnosis not present

## 2024-05-28 DIAGNOSIS — I2510 Atherosclerotic heart disease of native coronary artery without angina pectoris: Secondary | ICD-10-CM | POA: Diagnosis not present

## 2024-05-28 DIAGNOSIS — M4316 Spondylolisthesis, lumbar region: Secondary | ICD-10-CM | POA: Diagnosis not present

## 2024-05-28 DIAGNOSIS — M47817 Spondylosis without myelopathy or radiculopathy, lumbosacral region: Secondary | ICD-10-CM | POA: Diagnosis not present

## 2024-05-28 DIAGNOSIS — M1990 Unspecified osteoarthritis, unspecified site: Secondary | ICD-10-CM | POA: Diagnosis not present

## 2024-05-28 DIAGNOSIS — M4802 Spinal stenosis, cervical region: Secondary | ICD-10-CM | POA: Diagnosis not present

## 2024-05-28 DIAGNOSIS — M519 Unspecified thoracic, thoracolumbar and lumbosacral intervertebral disc disorder: Secondary | ICD-10-CM | POA: Diagnosis not present

## 2024-05-28 DIAGNOSIS — F32A Depression, unspecified: Secondary | ICD-10-CM | POA: Diagnosis not present

## 2024-05-28 DIAGNOSIS — F419 Anxiety disorder, unspecified: Secondary | ICD-10-CM | POA: Diagnosis not present

## 2024-05-28 DIAGNOSIS — M1611 Unilateral primary osteoarthritis, right hip: Secondary | ICD-10-CM | POA: Diagnosis not present

## 2024-05-28 DIAGNOSIS — H9193 Unspecified hearing loss, bilateral: Secondary | ICD-10-CM | POA: Diagnosis not present

## 2024-05-28 DIAGNOSIS — I83813 Varicose veins of bilateral lower extremities with pain: Secondary | ICD-10-CM | POA: Diagnosis not present

## 2024-05-28 DIAGNOSIS — I10 Essential (primary) hypertension: Secondary | ICD-10-CM | POA: Diagnosis not present

## 2024-05-28 DIAGNOSIS — M47812 Spondylosis without myelopathy or radiculopathy, cervical region: Secondary | ICD-10-CM | POA: Diagnosis not present

## 2024-05-28 DIAGNOSIS — I739 Peripheral vascular disease, unspecified: Secondary | ICD-10-CM | POA: Diagnosis not present

## 2024-05-28 DIAGNOSIS — E66812 Obesity, class 2: Secondary | ICD-10-CM | POA: Diagnosis not present

## 2024-05-28 DIAGNOSIS — M069 Rheumatoid arthritis, unspecified: Secondary | ICD-10-CM | POA: Diagnosis not present

## 2024-05-28 DIAGNOSIS — G4700 Insomnia, unspecified: Secondary | ICD-10-CM | POA: Diagnosis not present

## 2024-05-28 DIAGNOSIS — E039 Hypothyroidism, unspecified: Secondary | ICD-10-CM | POA: Diagnosis not present

## 2024-05-28 DIAGNOSIS — M4807 Spinal stenosis, lumbosacral region: Secondary | ICD-10-CM | POA: Diagnosis not present

## 2024-05-28 DIAGNOSIS — R1319 Other dysphagia: Secondary | ICD-10-CM | POA: Diagnosis not present

## 2024-05-28 DIAGNOSIS — E538 Deficiency of other specified B group vitamins: Secondary | ICD-10-CM | POA: Diagnosis not present

## 2024-05-28 DIAGNOSIS — I480 Paroxysmal atrial fibrillation: Secondary | ICD-10-CM | POA: Diagnosis not present

## 2024-05-28 DIAGNOSIS — G894 Chronic pain syndrome: Secondary | ICD-10-CM | POA: Diagnosis not present

## 2024-05-28 DIAGNOSIS — E785 Hyperlipidemia, unspecified: Secondary | ICD-10-CM | POA: Diagnosis not present

## 2024-05-28 DIAGNOSIS — D62 Acute posthemorrhagic anemia: Secondary | ICD-10-CM | POA: Diagnosis not present

## 2024-06-03 DIAGNOSIS — D62 Acute posthemorrhagic anemia: Secondary | ICD-10-CM | POA: Diagnosis not present

## 2024-06-03 DIAGNOSIS — M1611 Unilateral primary osteoarthritis, right hip: Secondary | ICD-10-CM | POA: Diagnosis not present

## 2024-06-03 DIAGNOSIS — M4807 Spinal stenosis, lumbosacral region: Secondary | ICD-10-CM | POA: Diagnosis not present

## 2024-06-03 DIAGNOSIS — M4802 Spinal stenosis, cervical region: Secondary | ICD-10-CM | POA: Diagnosis not present

## 2024-06-03 DIAGNOSIS — I83813 Varicose veins of bilateral lower extremities with pain: Secondary | ICD-10-CM | POA: Diagnosis not present

## 2024-06-03 DIAGNOSIS — E039 Hypothyroidism, unspecified: Secondary | ICD-10-CM | POA: Diagnosis not present

## 2024-06-03 DIAGNOSIS — R1319 Other dysphagia: Secondary | ICD-10-CM | POA: Diagnosis not present

## 2024-06-03 DIAGNOSIS — M1990 Unspecified osteoarthritis, unspecified site: Secondary | ICD-10-CM | POA: Diagnosis not present

## 2024-06-03 DIAGNOSIS — I10 Essential (primary) hypertension: Secondary | ICD-10-CM | POA: Diagnosis not present

## 2024-06-03 DIAGNOSIS — F32A Depression, unspecified: Secondary | ICD-10-CM | POA: Diagnosis not present

## 2024-06-03 DIAGNOSIS — H9193 Unspecified hearing loss, bilateral: Secondary | ICD-10-CM | POA: Diagnosis not present

## 2024-06-03 DIAGNOSIS — I480 Paroxysmal atrial fibrillation: Secondary | ICD-10-CM | POA: Diagnosis not present

## 2024-06-03 DIAGNOSIS — G894 Chronic pain syndrome: Secondary | ICD-10-CM | POA: Diagnosis not present

## 2024-06-03 DIAGNOSIS — I739 Peripheral vascular disease, unspecified: Secondary | ICD-10-CM | POA: Diagnosis not present

## 2024-06-03 DIAGNOSIS — F419 Anxiety disorder, unspecified: Secondary | ICD-10-CM | POA: Diagnosis not present

## 2024-06-03 DIAGNOSIS — E538 Deficiency of other specified B group vitamins: Secondary | ICD-10-CM | POA: Diagnosis not present

## 2024-06-03 DIAGNOSIS — I2510 Atherosclerotic heart disease of native coronary artery without angina pectoris: Secondary | ICD-10-CM | POA: Diagnosis not present

## 2024-06-03 DIAGNOSIS — M47812 Spondylosis without myelopathy or radiculopathy, cervical region: Secondary | ICD-10-CM | POA: Diagnosis not present

## 2024-06-03 DIAGNOSIS — E785 Hyperlipidemia, unspecified: Secondary | ICD-10-CM | POA: Diagnosis not present

## 2024-06-03 DIAGNOSIS — E66812 Obesity, class 2: Secondary | ICD-10-CM | POA: Diagnosis not present

## 2024-06-03 DIAGNOSIS — M4316 Spondylolisthesis, lumbar region: Secondary | ICD-10-CM | POA: Diagnosis not present

## 2024-06-03 DIAGNOSIS — M47817 Spondylosis without myelopathy or radiculopathy, lumbosacral region: Secondary | ICD-10-CM | POA: Diagnosis not present

## 2024-06-03 DIAGNOSIS — M069 Rheumatoid arthritis, unspecified: Secondary | ICD-10-CM | POA: Diagnosis not present

## 2024-06-03 DIAGNOSIS — M519 Unspecified thoracic, thoracolumbar and lumbosacral intervertebral disc disorder: Secondary | ICD-10-CM | POA: Diagnosis not present

## 2024-06-03 DIAGNOSIS — G4700 Insomnia, unspecified: Secondary | ICD-10-CM | POA: Diagnosis not present

## 2024-06-09 ENCOUNTER — Ambulatory Visit: Payer: Self-pay | Admitting: Oncology

## 2024-06-09 ENCOUNTER — Inpatient Hospital Stay: Attending: Oncology

## 2024-06-09 ENCOUNTER — Other Ambulatory Visit: Payer: Self-pay

## 2024-06-09 DIAGNOSIS — D509 Iron deficiency anemia, unspecified: Secondary | ICD-10-CM | POA: Diagnosis not present

## 2024-06-09 LAB — RETIC PANEL
Immature Retic Fract: 9.3 % (ref 2.3–15.9)
RBC.: 4.03 MIL/uL (ref 3.87–5.11)
Retic Count, Absolute: 68.1 K/uL (ref 19.0–186.0)
Retic Ct Pct: 1.7 % (ref 0.4–3.1)
Reticulocyte Hemoglobin: 34.2 pg (ref >27.9)

## 2024-06-09 LAB — CBC WITH DIFFERENTIAL (CANCER CENTER ONLY)
Abs Immature Granulocytes: 0.02 K/uL (ref 0.00–0.07)
Basophils Absolute: 0 K/uL (ref 0.0–0.1)
Basophils Relative: 0 %
Eosinophils Absolute: 0.3 K/uL (ref 0.0–0.5)
Eosinophils Relative: 5 %
HCT: 36.6 % (ref 36.0–46.0)
Hemoglobin: 11.9 g/dL — ABNORMAL LOW (ref 12.0–15.0)
Immature Granulocytes: 0 %
Lymphocytes Relative: 27 %
Lymphs Abs: 1.5 K/uL (ref 0.7–4.0)
MCH: 30 pg (ref 26.0–34.0)
MCHC: 32.5 g/dL (ref 30.0–36.0)
MCV: 92.2 fL (ref 80.0–100.0)
Monocytes Absolute: 0.8 K/uL (ref 0.1–1.0)
Monocytes Relative: 14 %
Neutro Abs: 3.1 K/uL (ref 1.7–7.7)
Neutrophils Relative %: 54 %
Platelet Count: 191 K/uL (ref 150–400)
RBC: 3.97 MIL/uL (ref 3.87–5.11)
RDW: 14 % (ref 11.5–15.5)
WBC Count: 5.8 K/uL (ref 4.0–10.5)
nRBC: 0 % (ref 0.0–0.2)

## 2024-06-09 LAB — IRON AND TIBC
Iron: 160 ug/dL (ref 28–170)
Saturation Ratios: 52 % — ABNORMAL HIGH (ref 10.4–31.8)
TIBC: 308 ug/dL (ref 250–450)
UIBC: 148 ug/dL

## 2024-06-09 LAB — FERRITIN: Ferritin: 31 ng/mL (ref 11–307)

## 2024-06-10 ENCOUNTER — Telehealth: Payer: Self-pay

## 2024-06-10 DIAGNOSIS — E66812 Obesity, class 2: Secondary | ICD-10-CM | POA: Diagnosis not present

## 2024-06-10 DIAGNOSIS — M069 Rheumatoid arthritis, unspecified: Secondary | ICD-10-CM | POA: Diagnosis not present

## 2024-06-10 DIAGNOSIS — M4316 Spondylolisthesis, lumbar region: Secondary | ICD-10-CM | POA: Diagnosis not present

## 2024-06-10 DIAGNOSIS — R1319 Other dysphagia: Secondary | ICD-10-CM | POA: Diagnosis not present

## 2024-06-10 DIAGNOSIS — I739 Peripheral vascular disease, unspecified: Secondary | ICD-10-CM | POA: Diagnosis not present

## 2024-06-10 DIAGNOSIS — D649 Anemia, unspecified: Secondary | ICD-10-CM | POA: Diagnosis not present

## 2024-06-10 DIAGNOSIS — E039 Hypothyroidism, unspecified: Secondary | ICD-10-CM | POA: Diagnosis not present

## 2024-06-10 DIAGNOSIS — M1611 Unilateral primary osteoarthritis, right hip: Secondary | ICD-10-CM | POA: Diagnosis not present

## 2024-06-10 DIAGNOSIS — M519 Unspecified thoracic, thoracolumbar and lumbosacral intervertebral disc disorder: Secondary | ICD-10-CM | POA: Diagnosis not present

## 2024-06-10 DIAGNOSIS — H9193 Unspecified hearing loss, bilateral: Secondary | ICD-10-CM | POA: Diagnosis not present

## 2024-06-10 DIAGNOSIS — I83813 Varicose veins of bilateral lower extremities with pain: Secondary | ICD-10-CM | POA: Diagnosis not present

## 2024-06-10 DIAGNOSIS — I509 Heart failure, unspecified: Secondary | ICD-10-CM | POA: Diagnosis not present

## 2024-06-10 DIAGNOSIS — M47812 Spondylosis without myelopathy or radiculopathy, cervical region: Secondary | ICD-10-CM | POA: Diagnosis not present

## 2024-06-10 DIAGNOSIS — F419 Anxiety disorder, unspecified: Secondary | ICD-10-CM | POA: Diagnosis not present

## 2024-06-10 DIAGNOSIS — M47817 Spondylosis without myelopathy or radiculopathy, lumbosacral region: Secondary | ICD-10-CM | POA: Diagnosis not present

## 2024-06-10 DIAGNOSIS — I480 Paroxysmal atrial fibrillation: Secondary | ICD-10-CM | POA: Diagnosis not present

## 2024-06-10 DIAGNOSIS — I2510 Atherosclerotic heart disease of native coronary artery without angina pectoris: Secondary | ICD-10-CM | POA: Diagnosis not present

## 2024-06-10 DIAGNOSIS — I110 Hypertensive heart disease with heart failure: Secondary | ICD-10-CM | POA: Diagnosis not present

## 2024-06-10 DIAGNOSIS — E538 Deficiency of other specified B group vitamins: Secondary | ICD-10-CM | POA: Diagnosis not present

## 2024-06-10 DIAGNOSIS — E785 Hyperlipidemia, unspecified: Secondary | ICD-10-CM | POA: Diagnosis not present

## 2024-06-10 DIAGNOSIS — M4802 Spinal stenosis, cervical region: Secondary | ICD-10-CM | POA: Diagnosis not present

## 2024-06-10 DIAGNOSIS — G894 Chronic pain syndrome: Secondary | ICD-10-CM | POA: Diagnosis not present

## 2024-06-10 DIAGNOSIS — M4807 Spinal stenosis, lumbosacral region: Secondary | ICD-10-CM | POA: Diagnosis not present

## 2024-06-10 DIAGNOSIS — G4700 Insomnia, unspecified: Secondary | ICD-10-CM | POA: Diagnosis not present

## 2024-06-10 DIAGNOSIS — F32A Depression, unspecified: Secondary | ICD-10-CM | POA: Diagnosis not present

## 2024-06-10 NOTE — Telephone Encounter (Signed)
 These orders were received yesterday and placed in Carmen [name]'s in box to be signed.

## 2024-06-10 NOTE — Telephone Encounter (Signed)
 Copied from CRM [id number]. Topic: General - Other >> Jun 10, 2024 11:10 AM Carlyon D wrote: Reason for CRM: Kaitlyn from [facility] is faxing over orders for pt. Please fax back to Fax # [phone] .  Kaitlyn call back # [phone]

## 2024-06-10 NOTE — Telephone Encounter (Signed)
-----   Message from [name] sent at 06/09/2024 11:12 PM EDT ----- Please let patient know that her red blood cell count/hemoglobin level has improved to 11.9.  No need for IV Venofer treatments. Recommend patient to continue oral iron  supplementation and follow-up with me in 4 months lab prior to MD.  Please order CBC iron  TIBC ferritin reticulocyte panel.  Thank you ----- Message ----- From: [name], [facility] [place] Sent: 06/09/2024   2:18 PM EDT To: [name], MD

## 2024-06-10 NOTE — Telephone Encounter (Signed)
 Spoke to pt's daughter and informed her of lab results and MD recommendation. Call transferred to scheduling to set up appt:   4 months: las prior to MD

## 2024-06-10 NOTE — Telephone Encounter (Signed)
 Completed and in outbox.

## 2024-06-11 NOTE — Telephone Encounter (Signed)
 Forms have been faxed to [facility]. Nothing further was needed.

## 2024-06-13 DIAGNOSIS — I110 Hypertensive heart disease with heart failure: Secondary | ICD-10-CM | POA: Diagnosis not present

## 2024-06-13 DIAGNOSIS — E785 Hyperlipidemia, unspecified: Secondary | ICD-10-CM | POA: Diagnosis not present

## 2024-06-13 DIAGNOSIS — D649 Anemia, unspecified: Secondary | ICD-10-CM | POA: Diagnosis not present

## 2024-06-13 DIAGNOSIS — E538 Deficiency of other specified B group vitamins: Secondary | ICD-10-CM | POA: Diagnosis not present

## 2024-06-13 DIAGNOSIS — I739 Peripheral vascular disease, unspecified: Secondary | ICD-10-CM | POA: Diagnosis not present

## 2024-06-13 DIAGNOSIS — M4802 Spinal stenosis, cervical region: Secondary | ICD-10-CM | POA: Diagnosis not present

## 2024-06-13 DIAGNOSIS — I480 Paroxysmal atrial fibrillation: Secondary | ICD-10-CM | POA: Diagnosis not present

## 2024-06-13 DIAGNOSIS — M47817 Spondylosis without myelopathy or radiculopathy, lumbosacral region: Secondary | ICD-10-CM | POA: Diagnosis not present

## 2024-06-13 DIAGNOSIS — M069 Rheumatoid arthritis, unspecified: Secondary | ICD-10-CM | POA: Diagnosis not present

## 2024-06-13 DIAGNOSIS — M4316 Spondylolisthesis, lumbar region: Secondary | ICD-10-CM | POA: Diagnosis not present

## 2024-06-13 DIAGNOSIS — I2510 Atherosclerotic heart disease of native coronary artery without angina pectoris: Secondary | ICD-10-CM | POA: Diagnosis not present

## 2024-06-13 DIAGNOSIS — R1319 Other dysphagia: Secondary | ICD-10-CM | POA: Diagnosis not present

## 2024-06-13 DIAGNOSIS — E66812 Obesity, class 2: Secondary | ICD-10-CM | POA: Diagnosis not present

## 2024-06-13 DIAGNOSIS — M519 Unspecified thoracic, thoracolumbar and lumbosacral intervertebral disc disorder: Secondary | ICD-10-CM | POA: Diagnosis not present

## 2024-06-13 DIAGNOSIS — G4700 Insomnia, unspecified: Secondary | ICD-10-CM | POA: Diagnosis not present

## 2024-06-13 DIAGNOSIS — M4807 Spinal stenosis, lumbosacral region: Secondary | ICD-10-CM | POA: Diagnosis not present

## 2024-06-13 DIAGNOSIS — F419 Anxiety disorder, unspecified: Secondary | ICD-10-CM | POA: Diagnosis not present

## 2024-06-13 DIAGNOSIS — M47812 Spondylosis without myelopathy or radiculopathy, cervical region: Secondary | ICD-10-CM | POA: Diagnosis not present

## 2024-06-13 DIAGNOSIS — G894 Chronic pain syndrome: Secondary | ICD-10-CM | POA: Diagnosis not present

## 2024-06-13 DIAGNOSIS — F32A Depression, unspecified: Secondary | ICD-10-CM | POA: Diagnosis not present

## 2024-06-13 DIAGNOSIS — E039 Hypothyroidism, unspecified: Secondary | ICD-10-CM | POA: Diagnosis not present

## 2024-06-13 DIAGNOSIS — M1611 Unilateral primary osteoarthritis, right hip: Secondary | ICD-10-CM | POA: Diagnosis not present

## 2024-06-13 DIAGNOSIS — I83813 Varicose veins of bilateral lower extremities with pain: Secondary | ICD-10-CM | POA: Diagnosis not present

## 2024-06-13 DIAGNOSIS — H9193 Unspecified hearing loss, bilateral: Secondary | ICD-10-CM | POA: Diagnosis not present

## 2024-06-13 DIAGNOSIS — I509 Heart failure, unspecified: Secondary | ICD-10-CM | POA: Diagnosis not present

## 2024-06-14 ENCOUNTER — Other Ambulatory Visit: Payer: Self-pay | Admitting: Family

## 2024-06-14 DIAGNOSIS — G4700 Insomnia, unspecified: Secondary | ICD-10-CM

## 2024-06-16 ENCOUNTER — Telehealth: Payer: Self-pay | Admitting: Family

## 2024-06-16 NOTE — Telephone Encounter (Signed)
 Order has been faxed. Kaitlyn has been made aware.

## 2024-06-16 NOTE — Telephone Encounter (Signed)
 Copied from CRM #[id number]. Topic: General - Other >> Jun 16, 2024 12:30 PM Viola F wrote: Reason for CRM: Kaitlyn called to follow up on fax that was sent 06/11/24 for home health orders - Order [phone]. I let her know that a form was faxed on 06/11/24 but she never received it. Kaitlyn requested confirmation that this fax was received and to let her know when fax is resent to  [phone]. Her call back number is [phone]

## 2024-06-19 DIAGNOSIS — D649 Anemia, unspecified: Secondary | ICD-10-CM | POA: Diagnosis not present

## 2024-06-19 DIAGNOSIS — F419 Anxiety disorder, unspecified: Secondary | ICD-10-CM | POA: Diagnosis not present

## 2024-06-19 DIAGNOSIS — I110 Hypertensive heart disease with heart failure: Secondary | ICD-10-CM | POA: Diagnosis not present

## 2024-06-19 DIAGNOSIS — M069 Rheumatoid arthritis, unspecified: Secondary | ICD-10-CM | POA: Diagnosis not present

## 2024-06-19 DIAGNOSIS — M47812 Spondylosis without myelopathy or radiculopathy, cervical region: Secondary | ICD-10-CM | POA: Diagnosis not present

## 2024-06-19 DIAGNOSIS — M1611 Unilateral primary osteoarthritis, right hip: Secondary | ICD-10-CM | POA: Diagnosis not present

## 2024-06-19 DIAGNOSIS — M4807 Spinal stenosis, lumbosacral region: Secondary | ICD-10-CM | POA: Diagnosis not present

## 2024-06-19 DIAGNOSIS — H9193 Unspecified hearing loss, bilateral: Secondary | ICD-10-CM | POA: Diagnosis not present

## 2024-06-19 DIAGNOSIS — M47817 Spondylosis without myelopathy or radiculopathy, lumbosacral region: Secondary | ICD-10-CM | POA: Diagnosis not present

## 2024-06-19 DIAGNOSIS — E66812 Obesity, class 2: Secondary | ICD-10-CM | POA: Diagnosis not present

## 2024-06-19 DIAGNOSIS — I739 Peripheral vascular disease, unspecified: Secondary | ICD-10-CM | POA: Diagnosis not present

## 2024-06-19 DIAGNOSIS — E039 Hypothyroidism, unspecified: Secondary | ICD-10-CM | POA: Diagnosis not present

## 2024-06-19 DIAGNOSIS — I480 Paroxysmal atrial fibrillation: Secondary | ICD-10-CM | POA: Diagnosis not present

## 2024-06-19 DIAGNOSIS — E538 Deficiency of other specified B group vitamins: Secondary | ICD-10-CM | POA: Diagnosis not present

## 2024-06-19 DIAGNOSIS — I509 Heart failure, unspecified: Secondary | ICD-10-CM | POA: Diagnosis not present

## 2024-06-19 DIAGNOSIS — R1319 Other dysphagia: Secondary | ICD-10-CM | POA: Diagnosis not present

## 2024-06-19 DIAGNOSIS — E785 Hyperlipidemia, unspecified: Secondary | ICD-10-CM | POA: Diagnosis not present

## 2024-06-19 DIAGNOSIS — I2510 Atherosclerotic heart disease of native coronary artery without angina pectoris: Secondary | ICD-10-CM | POA: Diagnosis not present

## 2024-06-19 DIAGNOSIS — F32A Depression, unspecified: Secondary | ICD-10-CM | POA: Diagnosis not present

## 2024-06-19 DIAGNOSIS — I83813 Varicose veins of bilateral lower extremities with pain: Secondary | ICD-10-CM | POA: Diagnosis not present

## 2024-06-19 DIAGNOSIS — M519 Unspecified thoracic, thoracolumbar and lumbosacral intervertebral disc disorder: Secondary | ICD-10-CM | POA: Diagnosis not present

## 2024-06-19 DIAGNOSIS — M4802 Spinal stenosis, cervical region: Secondary | ICD-10-CM | POA: Diagnosis not present

## 2024-06-19 DIAGNOSIS — M4316 Spondylolisthesis, lumbar region: Secondary | ICD-10-CM | POA: Diagnosis not present

## 2024-06-19 DIAGNOSIS — G894 Chronic pain syndrome: Secondary | ICD-10-CM | POA: Diagnosis not present

## 2024-06-19 DIAGNOSIS — G4700 Insomnia, unspecified: Secondary | ICD-10-CM | POA: Diagnosis not present

## 2024-06-20 ENCOUNTER — Telehealth: Payer: Self-pay

## 2024-06-20 MED ORDER — LEVOTHYROXINE SODIUM 137 MCG PO TABS: 137.0000 ug | ORAL_TABLET | Freq: Every day | ORAL | 0 refills | Status: DC

## 2024-06-20 NOTE — Telephone Encounter (Signed)
 Copied from CRM [id number]. Topic: Clinical - Prescription Issue >> Jun 20, 2024  8:44 AM Jasmin G wrote: Reason for CRM: Pt's daughter called due to levothyroxine  (SYNTHROID ) 137 MCG tablet not being ready at [facility] [address] [place] - [address], pt's daughter states that they've been working on it for about 5 days and pt will be out of medication soon, please if possible update pt on med refill at [phone].

## 2024-06-20 NOTE — Addendum Note (Signed)
 Addended by: ALBINO SHAVER C on: 06/20/2024 11:35 AM   Modules accepted: Orders

## 2024-06-20 NOTE — Telephone Encounter (Signed)
 Refill has been sent in. Pt's daughter is aware.

## 2024-06-24 DIAGNOSIS — F419 Anxiety disorder, unspecified: Secondary | ICD-10-CM | POA: Diagnosis not present

## 2024-06-24 DIAGNOSIS — I480 Paroxysmal atrial fibrillation: Secondary | ICD-10-CM | POA: Diagnosis not present

## 2024-06-24 DIAGNOSIS — D649 Anemia, unspecified: Secondary | ICD-10-CM | POA: Diagnosis not present

## 2024-06-24 DIAGNOSIS — H9193 Unspecified hearing loss, bilateral: Secondary | ICD-10-CM | POA: Diagnosis not present

## 2024-06-24 DIAGNOSIS — I509 Heart failure, unspecified: Secondary | ICD-10-CM | POA: Diagnosis not present

## 2024-06-24 DIAGNOSIS — M519 Unspecified thoracic, thoracolumbar and lumbosacral intervertebral disc disorder: Secondary | ICD-10-CM | POA: Diagnosis not present

## 2024-06-24 DIAGNOSIS — I110 Hypertensive heart disease with heart failure: Secondary | ICD-10-CM | POA: Diagnosis not present

## 2024-06-24 DIAGNOSIS — R1319 Other dysphagia: Secondary | ICD-10-CM | POA: Diagnosis not present

## 2024-06-24 DIAGNOSIS — M1611 Unilateral primary osteoarthritis, right hip: Secondary | ICD-10-CM | POA: Diagnosis not present

## 2024-06-24 DIAGNOSIS — M4316 Spondylolisthesis, lumbar region: Secondary | ICD-10-CM | POA: Diagnosis not present

## 2024-06-24 DIAGNOSIS — E785 Hyperlipidemia, unspecified: Secondary | ICD-10-CM | POA: Diagnosis not present

## 2024-06-24 DIAGNOSIS — M47817 Spondylosis without myelopathy or radiculopathy, lumbosacral region: Secondary | ICD-10-CM | POA: Diagnosis not present

## 2024-06-24 DIAGNOSIS — E039 Hypothyroidism, unspecified: Secondary | ICD-10-CM | POA: Diagnosis not present

## 2024-06-24 DIAGNOSIS — I739 Peripheral vascular disease, unspecified: Secondary | ICD-10-CM | POA: Diagnosis not present

## 2024-06-24 DIAGNOSIS — F32A Depression, unspecified: Secondary | ICD-10-CM | POA: Diagnosis not present

## 2024-06-24 DIAGNOSIS — E66812 Obesity, class 2: Secondary | ICD-10-CM | POA: Diagnosis not present

## 2024-06-24 DIAGNOSIS — I2510 Atherosclerotic heart disease of native coronary artery without angina pectoris: Secondary | ICD-10-CM | POA: Diagnosis not present

## 2024-06-24 DIAGNOSIS — M4802 Spinal stenosis, cervical region: Secondary | ICD-10-CM | POA: Diagnosis not present

## 2024-06-24 DIAGNOSIS — E538 Deficiency of other specified B group vitamins: Secondary | ICD-10-CM | POA: Diagnosis not present

## 2024-06-24 DIAGNOSIS — M47812 Spondylosis without myelopathy or radiculopathy, cervical region: Secondary | ICD-10-CM | POA: Diagnosis not present

## 2024-06-24 DIAGNOSIS — G4700 Insomnia, unspecified: Secondary | ICD-10-CM | POA: Diagnosis not present

## 2024-06-24 DIAGNOSIS — M4807 Spinal stenosis, lumbosacral region: Secondary | ICD-10-CM | POA: Diagnosis not present

## 2024-06-24 DIAGNOSIS — M069 Rheumatoid arthritis, unspecified: Secondary | ICD-10-CM | POA: Diagnosis not present

## 2024-06-24 DIAGNOSIS — I83813 Varicose veins of bilateral lower extremities with pain: Secondary | ICD-10-CM | POA: Diagnosis not present

## 2024-06-24 DIAGNOSIS — G894 Chronic pain syndrome: Secondary | ICD-10-CM | POA: Diagnosis not present

## 2024-06-30 DIAGNOSIS — I83813 Varicose veins of bilateral lower extremities with pain: Secondary | ICD-10-CM | POA: Diagnosis not present

## 2024-06-30 DIAGNOSIS — E039 Hypothyroidism, unspecified: Secondary | ICD-10-CM | POA: Diagnosis not present

## 2024-06-30 DIAGNOSIS — E538 Deficiency of other specified B group vitamins: Secondary | ICD-10-CM | POA: Diagnosis not present

## 2024-06-30 DIAGNOSIS — G894 Chronic pain syndrome: Secondary | ICD-10-CM | POA: Diagnosis not present

## 2024-06-30 DIAGNOSIS — G4700 Insomnia, unspecified: Secondary | ICD-10-CM | POA: Diagnosis not present

## 2024-06-30 DIAGNOSIS — H9193 Unspecified hearing loss, bilateral: Secondary | ICD-10-CM | POA: Diagnosis not present

## 2024-06-30 DIAGNOSIS — M47812 Spondylosis without myelopathy or radiculopathy, cervical region: Secondary | ICD-10-CM | POA: Diagnosis not present

## 2024-06-30 DIAGNOSIS — M1611 Unilateral primary osteoarthritis, right hip: Secondary | ICD-10-CM | POA: Diagnosis not present

## 2024-06-30 DIAGNOSIS — D649 Anemia, unspecified: Secondary | ICD-10-CM | POA: Diagnosis not present

## 2024-06-30 DIAGNOSIS — M4807 Spinal stenosis, lumbosacral region: Secondary | ICD-10-CM | POA: Diagnosis not present

## 2024-06-30 DIAGNOSIS — F419 Anxiety disorder, unspecified: Secondary | ICD-10-CM | POA: Diagnosis not present

## 2024-06-30 DIAGNOSIS — M4802 Spinal stenosis, cervical region: Secondary | ICD-10-CM | POA: Diagnosis not present

## 2024-06-30 DIAGNOSIS — R1319 Other dysphagia: Secondary | ICD-10-CM | POA: Diagnosis not present

## 2024-06-30 DIAGNOSIS — I110 Hypertensive heart disease with heart failure: Secondary | ICD-10-CM | POA: Diagnosis not present

## 2024-06-30 DIAGNOSIS — E785 Hyperlipidemia, unspecified: Secondary | ICD-10-CM | POA: Diagnosis not present

## 2024-06-30 DIAGNOSIS — I2510 Atherosclerotic heart disease of native coronary artery without angina pectoris: Secondary | ICD-10-CM | POA: Diagnosis not present

## 2024-06-30 DIAGNOSIS — E66812 Obesity, class 2: Secondary | ICD-10-CM | POA: Diagnosis not present

## 2024-06-30 DIAGNOSIS — I739 Peripheral vascular disease, unspecified: Secondary | ICD-10-CM | POA: Diagnosis not present

## 2024-06-30 DIAGNOSIS — M47817 Spondylosis without myelopathy or radiculopathy, lumbosacral region: Secondary | ICD-10-CM | POA: Diagnosis not present

## 2024-06-30 DIAGNOSIS — M069 Rheumatoid arthritis, unspecified: Secondary | ICD-10-CM | POA: Diagnosis not present

## 2024-06-30 DIAGNOSIS — F32A Depression, unspecified: Secondary | ICD-10-CM | POA: Diagnosis not present

## 2024-06-30 DIAGNOSIS — I480 Paroxysmal atrial fibrillation: Secondary | ICD-10-CM | POA: Diagnosis not present

## 2024-06-30 DIAGNOSIS — I509 Heart failure, unspecified: Secondary | ICD-10-CM | POA: Diagnosis not present

## 2024-06-30 DIAGNOSIS — M519 Unspecified thoracic, thoracolumbar and lumbosacral intervertebral disc disorder: Secondary | ICD-10-CM | POA: Diagnosis not present

## 2024-06-30 DIAGNOSIS — M4316 Spondylolisthesis, lumbar region: Secondary | ICD-10-CM | POA: Diagnosis not present

## 2024-07-07 ENCOUNTER — Encounter: Payer: Self-pay | Admitting: Family

## 2024-07-07 ENCOUNTER — Ambulatory Visit (INDEPENDENT_AMBULATORY_CARE_PROVIDER_SITE_OTHER): Admitting: Family

## 2024-07-07 ENCOUNTER — Telehealth: Payer: Self-pay | Admitting: Family

## 2024-07-07 ENCOUNTER — Ambulatory Visit
Admission: RE | Admit: 2024-07-07 | Discharge: 2024-07-07 | Disposition: A | Source: Ambulatory Visit | Attending: Family

## 2024-07-07 VITALS — BP 126/82 | HR 76 | Temp 98.2°F | Ht 60.0 in | Wt 178.4 lb

## 2024-07-07 DIAGNOSIS — E039 Hypothyroidism, unspecified: Secondary | ICD-10-CM | POA: Diagnosis not present

## 2024-07-07 DIAGNOSIS — K5903 Drug induced constipation: Secondary | ICD-10-CM

## 2024-07-07 DIAGNOSIS — R1011 Right upper quadrant pain: Secondary | ICD-10-CM

## 2024-07-07 DIAGNOSIS — I480 Paroxysmal atrial fibrillation: Secondary | ICD-10-CM | POA: Diagnosis not present

## 2024-07-07 DIAGNOSIS — I10 Essential (primary) hypertension: Secondary | ICD-10-CM

## 2024-07-07 DIAGNOSIS — R1319 Other dysphagia: Secondary | ICD-10-CM

## 2024-07-07 DIAGNOSIS — R109 Unspecified abdominal pain: Secondary | ICD-10-CM | POA: Diagnosis not present

## 2024-07-07 DIAGNOSIS — K5900 Constipation, unspecified: Secondary | ICD-10-CM | POA: Diagnosis not present

## 2024-07-07 DIAGNOSIS — Z23 Encounter for immunization: Secondary | ICD-10-CM | POA: Diagnosis not present

## 2024-07-07 MED ORDER — FUROSEMIDE 20 MG PO TABS: 20.0000 mg | ORAL_TABLET | Freq: Every day | ORAL | Status: AC | PRN

## 2024-07-07 MED ORDER — LOSARTAN POTASSIUM 25 MG PO TABS: 12.5000 mg | ORAL_TABLET | Freq: Every day | ORAL | Status: DC

## 2024-07-07 NOTE — Progress Notes (Signed)
 Established Patient Office Visit  Subjective:      CC:  Chief Complaint  Patient presents with   Acute Visit    Reports nausea and trouble swallowing.    HPI: Carmen [name] is a 83 y.o. female presenting on 07/07/2024 for Acute Visit (Reports nausea and trouble swallowing.) .  Discussed the use of AI scribe software for clinical note transcription with the patient, who gave verbal consent to proceed.  History of Present Illness Carmen [name] is an 83 year old female who presents with nausea and difficulty swallowing.  She experiences persistent nausea, which she attributes to her iron  supplementation. She has been taking oral iron  pills daily for the past two months, which has exacerbated her nausea. The nausea typically sets in after taking the iron  post-lunch. She has a history of H. pylori infection, which was treated prior to the initiation of iron  therapy.  She has difficulty swallowing, ongoing since at least January. She describes the sensation as food 'hanging' in her throat and experiences this with both liquids and solids. The difficulty swallowing worsened after dental work. She sometimes chokes when swallowing, and this occurs every time she eats or drinks.  No heartburn while on Prilosec and famotidine , which she takes once daily at night. She experiences constipation and diarrhea intermittently, likely related to iron  supplementation and Colace use. She takes Colace daily, but it does not always alleviate constipation.  She takes furosemide  20 mg as needed, four times a week, and metoprolol  12.5 mg daily. She also takes losartan  25 mg, atorvastatin , pantoprazole , trazodone , levothyroxine , Depakote , and hydrocodone  for arthritis pain. She uses peppermint and ginger for nausea relief.  She eats breakfast and lunch but only consumes yogurt for dinner. She gets out of breath quickly during physical activity, which she attributes to her age and heart condition. No blood in  stool, vomiting, or heartburn. Reports abdominal rumbling, constipation, and diarrhea. No pelvic pain. Reports shortness of breath with activity.         Social history:  Relevant past medical, surgical, family and social history reviewed and updated as indicated. Interim medical history since our last visit reviewed.  Allergies and medications reviewed and updated.  DATA REVIEWED: CHART IN EPIC     ROS: Negative unless specifically indicated above in HPI.    Current Outpatient Medications:    atorvastatin  (LIPITOR) 40 MG tablet, Take 1 tablet (40 mg total) by mouth at bedtime., Disp: 90 tablet, Rfl: 3   BIOFREEZE ROLL-ON 4 % GEL, Apply 1 Application topically 3 (three) times daily., Disp: , Rfl:    CALCIUM  PO, Take 1 tablet by mouth daily., Disp: , Rfl:    Cholecalciferol (VITAMIN D -3) 25 MCG (1000 UT) CAPS, Take 1,000 Units by mouth daily., Disp: , Rfl:    clopidogrel  (PLAVIX ) 75 MG tablet, Take 1 tablet (75 mg total) by mouth daily., Disp: 90 tablet, Rfl: 3   cyanocobalamin  (VITAMIN B12) 500 MCG tablet, Take 500 mcg by mouth daily., Disp: , Rfl:    diphenhydrAMINE (BENADRYL) 25 MG tablet, Take 25 mg by mouth at bedtime as needed for allergies., Disp: , Rfl:    divalproex  (DEPAKOTE ) 500 MG DR tablet, Take 1 tablet (500 mg total) by mouth at bedtime., Disp: 90 tablet, Rfl: 3   famotidine  (PEPCID  AC) 10 MG tablet, Take 10 mg by mouth 2 (two) times daily., Disp: , Rfl:    ferrous sulfate  (FEROSUL) 325 (65 FE) MG tablet, Take 1 tablet by mouth once daily, Disp: 90  tablet, Rfl: 1   furosemide  (LASIX ) 20 MG tablet, Take 1 tablet (20 mg total) by mouth daily as needed (for swelling)., Disp: , Rfl:    HYDROcodone -acetaminophen  (NORCO/VICODIN) 5-325 MG tablet, Take 1 tablet by mouth 2 (two) times daily as needed for severe pain (pain score 7-10). Must last 30 days., Disp: 60 tablet, Rfl: 0   HYDROcodone -acetaminophen  (NORCO/VICODIN) 5-325 MG tablet, Take 1 tablet by mouth 2 (two) times  daily as needed for severe pain (pain score 7-10). Must last 30 days., Disp: 60 tablet, Rfl: 0   HYDROcodone -acetaminophen  (NORCO/VICODIN) 5-325 MG tablet, Take 1 tablet by mouth 2 (two) times daily as needed for severe pain (pain score 7-10). Must last 30 days., Disp: 60 tablet, Rfl: 0   hydroxychloroquine  (PLAQUENIL ) 200 MG tablet, Take 200 mg by mouth 2 (two) times daily., Disp: , Rfl:    levothyroxine  (SYNTHROID ) 137 MCG tablet, Take 1 tablet (137 mcg total) by mouth daily before breakfast., Disp: 90 tablet, Rfl: 0   loratadine  (EQ ALL DAY ALLERGY RELIEF) 10 MG tablet, Take 1 tablet by mouth once daily, Disp: 30 tablet, Rfl: 5   losartan  (COZAAR ) 25 MG tablet, Take 0.5 tablets (12.5 mg total) by mouth daily., Disp: , Rfl:    magnesium  oxide (MAG-OX) 400 (240 Mg) MG tablet, Take 400 mg by mouth daily., Disp: , Rfl:    metoprolol  succinate (TOPROL -XL) 25 MG 24 hr tablet, Take 12.5 mg by mouth daily., Disp: , Rfl:    naloxone  (NARCAN ) nasal spray 4 mg/0.1 mL, Place 1 spray into the nose as needed for up to 365 doses (for opioid-induced respiratory depresssion). In case of emergency (overdose), spray once into each nostril. If no response within 3 minutes, repeat application and call 911., Disp: 1 each, Rfl: 0   pantoprazole  (PROTONIX ) 40 MG tablet, TAKE 1 TABLET BY MOUTH TWICE A DAY, Disp: 180 tablet, Rfl: 1   polyethylene glycol (MIRALAX  / GLYCOLAX ) 17 g packet, Take 17 g by mouth daily as needed., Disp: 14 each, Rfl: 0   traZODone  (DESYREL ) 100 MG tablet, TAKE 1 TABLET BY MOUTH AT BEDTIME AS NEEDED FOR SLEEP, Disp: 90 tablet, Rfl: 0        Objective:        BP 126/82 (BP Location: Right Arm, Patient Position: Sitting, Cuff Size: Large)   Pulse 76   Temp 98.2 F (36.8 C) (Temporal)   Ht 5' (1.524 m)   Wt 178 lb 6.4 oz (80.9 kg)   SpO2 96%   BMI 34.84 kg/m   Physical Exam NECK: Non-tender. ABDOMEN: Tender to palpation, especially in the right upper quadrant.  Wt Readings from  Last 3 Encounters:  07/07/24 178 lb 6.4 oz (80.9 kg)  05/07/24 173 lb (78.5 kg)  04/24/24 174 lb (78.9 kg)    Physical Exam Constitutional:      General: She is not in acute distress.    Appearance: Normal appearance. She is normal weight. She is not ill-appearing, toxic-appearing or diaphoretic.  HENT:     Head: Normocephalic.  Cardiovascular:     Rate and Rhythm: Normal rate.  Pulmonary:     Effort: Pulmonary effort is normal.  Abdominal:     Tenderness: There is abdominal tenderness in the right upper quadrant, right lower quadrant, left upper quadrant and left lower quadrant.  Musculoskeletal:        General: Normal range of motion.  Neurological:     General: No focal deficit present.     Mental Status:  She is alert and oriented to person, place, and time. Mental status is at baseline.  Psychiatric:        Mood and Affect: Mood normal.        Behavior: Behavior normal.        Thought Content: Thought content normal.        Judgment: Judgment normal.          Results LABS   Hemoglobin (Hb): 11.9 g/dL  Assessment & Plan:   Assessment and Plan Assessment & Plan Iron  deficiency anemia with nausea secondary to oral iron  supplementation Hemoglobin has improved to 11.9. Nausea is likely exacerbated by oral iron  supplementation, with increased symptoms since starting iron  pills two months ago. - Continue oral iron  supplementation daily - Consider taking iron  pill at night to reduce nausea - Await response from Doctor Babara regarding alternative iron  formulations - Follow up with Doctor Babara in four months  Esophageal dysphagia Reports difficulty swallowing both liquids and solids, worsening after dental work. Previous swallow study was ordered but not completed. Referral to GI was not followed up due to other health issues. - Reorder swallow study - Provide contact information for gastroenterologist for follow-up  Constipation Experiences alternating constipation and  diarrhea, likely related to iron  supplementation and use of Colace. Constipation is not consistently managed with current regimen. - Perform abdominal x-ray to assess for fecal impaction  Abdominal pain and tenderness Significant tenderness in the right upper quadrant upon palpation. Pain is described as 4/10. - Order abdominal ultrasound to evaluate gallbladder and liver - Check liver function tests  History of Helicobacter pylori infection Previous H. pylori infection treated, but unclear if retesting was completed. Symptoms of nausea and abdominal discomfort persist. - Repeat H. 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 Metoprolol  dosage has been reduced to 12.5 mg due to bradycardia. - Continue current metoprolol  dosage of 12.5 mg - Follow up with cardiologist in three months  Hypertension On losartan  for hypertension management. Current dosage is half of 25 mg tablet. - Continue current losartan  dosage  Hyperlipidemia On atorvastatin  for cholesterol management. - Continue atorvastatin  once daily  Osteoarthritis On hydrocodone  for arthritis pain management. - Continue current pain management regimen  Hypothyroidism On levothyroxine  for hypothyroidism management. - Continue current levothyroxine  dosage        Return for as scheduled 10/29/24.     Ginger Patrick, MSN, APRN, FNP-C [facility]

## 2024-07-07 NOTE — Patient Instructions (Addendum)
 Good afternoon Carmen [name],   Our office received a referral from Kaige Whistler, FNP to schedule an appointment. If you will give our office a call at your convenience to discuss scheduling at  [phone] option 1   Thank you, [facility]   ------------------------------------ I have ordered imaging for you at [facility] outpatient diagnostic center. This order has been sent over for you electronically.  Please call [phone] to schedule this appointment.  ------------------------------------

## 2024-07-07 NOTE — Telephone Encounter (Signed)
 Good afternoon,   Our mutual pt is here she is taking iron  supplementation daily with improvement in her hgn. The iron  is causing her a lot of nausea. Is there any recommendation to cause less nausea?

## 2024-07-08 LAB — COMPREHENSIVE METABOLIC PANEL WITH GFR
ALT: 17 U/L (ref 0–35)
AST: 19 U/L (ref 0–37)
Albumin: 4.2 g/dL (ref 3.5–5.2)
Alkaline Phosphatase: 52 U/L (ref 39–117)
BUN: 13 mg/dL (ref 6–23)
CO2: 30 meq/L (ref 19–32)
Calcium: 9.1 mg/dL (ref 8.4–10.5)
Chloride: 101 meq/L (ref 96–112)
Creatinine, Ser: 0.81 mg/dL (ref 0.40–1.20)
GFR: 67.55 mL/min (ref >60.00)
Glucose, Bld: 123 mg/dL — ABNORMAL HIGH (ref 70–99)
Potassium: 4.1 meq/L (ref 3.5–5.1)
Sodium: 140 meq/L (ref 135–145)
Total Bilirubin: 0.3 mg/dL (ref 0.2–1.2)
Total Protein: 6.6 g/dL (ref 6.0–8.3)

## 2024-07-08 LAB — TSH: TSH: 2.94 u[IU]/mL (ref 0.35–5.50)

## 2024-07-08 LAB — CBC WITH DIFFERENTIAL/PLATELET
Basophils Absolute: 0.1 K/uL (ref 0.0–0.1)
Basophils Relative: 1.1 % (ref 0.0–3.0)
Eosinophils Absolute: 0.2 K/uL (ref 0.0–0.7)
Eosinophils Relative: 5.1 % — ABNORMAL HIGH (ref 0.0–5.0)
HCT: 37.9 % (ref 36.0–46.0)
Hemoglobin: 12.5 g/dL (ref 12.0–15.0)
Lymphocytes Relative: 23.7 % (ref 12.0–46.0)
Lymphs Abs: 1.1 K/uL (ref 0.7–4.0)
MCHC: 33.1 g/dL (ref 30.0–36.0)
MCV: 90.4 fl (ref 78.0–100.0)
Monocytes Absolute: 0.6 K/uL (ref 0.1–1.0)
Monocytes Relative: 11.8 % (ref 3.0–12.0)
Neutro Abs: 2.7 K/uL (ref 1.4–7.7)
Neutrophils Relative %: 58.3 % (ref 43.0–77.0)
Platelets: 198 K/uL (ref 150.0–400.0)
RBC: 4.19 Mil/uL (ref 3.87–5.11)
RDW: 14.3 % (ref 11.5–15.5)
WBC: 4.7 K/uL (ref 4.0–10.5)

## 2024-07-08 LAB — T4, FREE: Free T4: 1.03 ng/dL (ref 0.60–1.60)

## 2024-07-10 ENCOUNTER — Other Ambulatory Visit: Payer: Self-pay | Admitting: Family

## 2024-07-10 ENCOUNTER — Ambulatory Visit: Payer: Self-pay | Admitting: Family

## 2024-07-10 ENCOUNTER — Ambulatory Visit

## 2024-07-10 DIAGNOSIS — R739 Hyperglycemia, unspecified: Secondary | ICD-10-CM

## 2024-07-10 DIAGNOSIS — I10 Essential (primary) hypertension: Secondary | ICD-10-CM

## 2024-07-10 LAB — HEMOGLOBIN A1C: Hgb A1c MFr Bld: 5.7 % (ref 4.6–6.5)

## 2024-07-14 ENCOUNTER — Ambulatory Visit: Payer: Medicare Other | Admitting: Neurology

## 2024-07-14 ENCOUNTER — Encounter: Payer: Self-pay | Admitting: Neurology

## 2024-07-14 ENCOUNTER — Ambulatory Visit: Payer: Self-pay | Admitting: Family

## 2024-07-14 VITALS — BP 171/81 | HR 63 | Ht 60.0 in | Wt 179.2 lb

## 2024-07-14 DIAGNOSIS — R413 Other amnesia: Secondary | ICD-10-CM | POA: Diagnosis not present

## 2024-07-14 DIAGNOSIS — Z8673 Personal history of transient ischemic attack (TIA), and cerebral infarction without residual deficits: Secondary | ICD-10-CM

## 2024-07-14 DIAGNOSIS — F0150 Vascular dementia without behavioral disturbance: Secondary | ICD-10-CM

## 2024-07-14 MED ORDER — MEMANTINE HCL 10 MG PO TABS: 10.0000 mg | ORAL_TABLET | Freq: Two times a day (BID) | ORAL | 3 refills | Status: AC

## 2024-07-14 NOTE — Patient Instructions (Signed)
 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  Continue Plavix  for stroke prevention given history recent Watchman device for A-fib. Maintain aggressive risk factor modification with strict control of hypertension with blood pressure goal below 130/90, lipids with LDL cholesterol goal below 70 mg percent and diabetes with hemoglobin A1c goal below 6.5%.  I recommended trial of Namenda for memory loss and also encouraged patient to increase participation in cognitively challenging activities like solving crossword puzzles, playing bridge and sudoku.  We also discussed memory compensation strategies.  I encouraged her to continue ongoing home physical therapy and use a cane at all times and discussed fall safety precautions.     Return for follow-up in the future in 6 months with my nurse practitioner or call earlier if necessary.  Memantine Tablets What is this medication? MEMANTINE (MEM an teen) treats memory loss and confusion (dementia) in people who have Alzheimer disease. It works by improving attention, memory, and the ability to engage in daily activities. It is not a cure for dementia or Alzheimer disease. This medicine may be used for other purposes; ask your health care provider or pharmacist if you have questions. COMMON BRAND NAME(S): Namenda What should I tell my care team before I take this medication? They need to know if you have any of these conditions: Kidney disease Liver disease Seizures Trouble passing urine An unusual or allergic reaction to memantine, other medications, foods, dyes, or preservatives Pregnant or trying to get pregnant Breast-feeding How should I use this medication? Take this medication by mouth with water. Follow the directions on the prescription label. You may take this  medication with or without food. Take your doses at regular intervals. Do not take your medication more often than directed. Continue to take your medication even if you feel better. Do not stop taking except on the advice of your care team. Talk to your care team about the use of this medication in children. Special care may be needed Overdosage: If you think you have taken too much of this medicine contact a poison control center or emergency room at once. NOTE: This medicine is only for you. Do not share this medicine with others. What if I miss a dose? If you miss a dose, take it as soon as you can. If it is almost time for your next dose, take only that dose. Do not take double or extra doses. If you do not take your medication for several days, contact your care team. Your dose may need to be changed. What may interact with this medication? Acetazolamide Amantadine Cimetidine Dextromethorphan Dofetilide Hydrochlorothiazide  Ketamine Metformin Methazolamide Quinidine Ranitidine Sodium bicarbonate Triamterene This list may not describe all possible interactions. Give your health care provider a list of all the medicines, herbs, non-prescription drugs, or dietary supplements you use. Also tell them if you smoke, drink alcohol, or use illegal drugs. Some items may interact with your medicine. What should I watch for while using this medication? Visit your care team for regular checks on your progress. Check with your care team if there is no improvement in your symptoms or if they get worse. This medication may affect your coordination, reaction time, or judgment. Do not drive or operate machinery until you know how this medication affects you. Sit up or stand slowly to reduce the risk of  dizzy or fainting spells. Drinking alcohol with this medication can increase the risk of these side effects. What side effects may I notice from receiving this medication? Side effects that you should  report to your care team as soon as possible: Allergic reactions--skin rash, itching, hives, swelling of the face, lips, tongue, or throat Side effects that usually do not require medical attention (report to your care team if they continue or are bothersome): Confusion Constipation Diarrhea Dizziness Headache This list may not describe all possible side effects. Call your doctor for medical advice about side effects. You may report side effects to FDA at 1-800-FDA-1088. Where should I keep my medication? Keep out of the reach of children. Store at room temperature between 15 degrees and 30 degrees C (59 degrees and 86 degrees F). Throw away any unused medication after the expiration date. NOTE: This sheet is a summary. It may not cover all possible information. If you have questions about this medicine, talk to your doctor, pharmacist, or health care provider.  2024 Elsevier/Gold Standard (2021-10-11 00:00:00)

## 2024-07-14 NOTE — Progress Notes (Signed)
 [facility] [address] [place]. [place] [address] [phone]       OFFICE FOLLOW UP VISIT NOTE  [name] Date of Birth:  1941-01-04 Medical Record Number:  [id number]   Referring MD:  Renato Applebaum  Reason for Referral: Strokelike episode  HPI: Initial visit 06/08/2022:Carmen [name] is a 83 year old pleasant Caucasian lady seen today for initial office consultation visit for strokelike episode.  She is accompanied by her daughter.  History is obtained from them and review of electronic medical records and I personally reviewed pertinent available imaging films in PACS.  She has past medical history of chronic pain, remote stroke, depression, peripheral vascular disease, hypothyroidism, anemia, coronary artery disease, anxiety, chronic A-fib on long-term Eliquis .  She presented on 05/08/2022 from assisted living facility where her daughter was helping her back up for an appointment when all of a sudden she became stiff and staring and began breathing heavily and foaming at the mouth and was unresponsive.  Her daughter was able to catch her and help lower her gradually to the floor where she remained unresponsive for 5 to 10 minutes.  She was noted to have facial droop and right-sided weakness and some slurred speech when she started speaking.  CT head on admission was unremarkable.  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  There was noncalcified plaque of the right carotid with less than 50% stenosis.  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  2D echo showed ejection fraction of 70 to 75%.  LDL cholesterol was 83 mg percent and hemoglobin A1c 6.3.  EEG was obtained but it was normal and did not show any epileptiform activity.  Patient was continued on his Eliquis  and discharged back to assisted  living facility.  The daughter states that she is doing well she has had no further recurrent episodes.  She has had no other stroke or TIA symptoms.  She is tolerating Eliquis  well without bleeding or bruising.  She did have a history of stroke 2 years ago while she was in [place]  and was in atrial fibrillation but at that time was not on anticoagulation.  She has had some balance problems remaining from that stroke.  She also had a second stroke following which she has had short-term memory difficulties which have persisted.  She is living in assisted living facility but is mostly independent.  She is able to ambulate with a cane.  She is currently getting physical therapy which seems to have helped.  She has chronic back pain and gets pain injections at the pain clinic in her back.  She remains on Lipitor which is tolerating well without side effects.  Her blood pressure is usually well controlled but today it is elevated at 168/115 in the office. Update 03/29/2023 : She returns for f/u after last visit in September 2023.  Patient was admitted to [facility] on 10/14/2022 for evaluation of sudden onset of overall and left-sided weakness.  Patient was unclear as to why she fell.  When EMS arrived they found her with decreased level of responsiveness with left-sided weakness and.  A code stroke.  On arrival in the emergency room CT scan and CT angiogram were unremarkable.  MRI scan was obtained which showed no acute abnormality.  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  EEG showed right hemispheric slowing but no seizure activity.  Episode.  She will continue aspirin  and Plavix  which is tolerating well with Musa bruising.  She is currently doing physical therapy at home.  With her daughter.  The daughter visiting her mother-in-law's suite on her property.  Patient had several falls and developed bruising was felt to be high  risk for Eliquis  and had Watchman device done on 12/28/2022 by Dr. Cindie and the procedure went well without any issues.  He has not stopped Eliquis  since 4 weeks after the procedure.  She continues to have short-term memory and cognitive difficulties.  She has good days and bad days.  She still remains independent in activities of daily living.  For the shower.  Does not have any delusions, hallucinations, agitation with behaviors.  Daughter manages her medications.  He has no new complaints today. Update 07/14/2024 : She returns for follow-up after last visit with me more than a year ago.  She is accompanied by her daughter.  She states that she has had 2 small strokes in March when she was admitted to [facility].  She went to rehab for 3 weeks.  She is currently at home.  She still getting home physical occupational therapy.  She had a fall in last July and required 8 stitches.  She is still living alone and is mostly independent and can bathe and dress herself with needs some help with her medications and finances.  The daughter lives right next-door.  She has had decline in her short-term memory and cognitive issues.  She often struggles with the operating the TV remote as well as at times using her flip phone and landline phone.  On Mini-Mental status testing today she scored 19/30 which is a decline from 26/30 at last visit.  She is now on Plavix  which she is tolerating well without bruising or bleeding.  She states blood pressure is usually under good control though it is elevated in the office today at 171/81.  She denies any delusions, hallucinations, unsafe behavior. ROS:   14 system review of systems is positive for loss of memory loss, fall consciousness, stiffness, slurred speech, facial droop, weakness, gait imbalance, falls, memory loss all other systems negative  PMH:  Past Medical History:  Diagnosis Date   Abnormal CT scan, lumbar spine (05/11/2021) 05/17/2021   (05/11/2021) LUMBAR CT FINDINGS:  Alignment: Lumbar levocurvature, apex L4. Mild lateral listhesis L4 on L5 of approximately 3 mm. Vertebrae: Remote appearing superior endplate deformities at T12 with 10% height loss and L1 with up to 20% height loss. Multilevel discogenic and facet degenerative changes. Mild bilateral SI joint arthrosis.  DISC LEVELS: T11-T12: Near complete disc height loss with de   Abnormal MRI, cervical spine (05/12/2021) 05/17/2021   (05/12/2021) CERVICAL MRI FINDINGS: Motion artifact is present. Posterior Fossa, vertebral arteries, paraspinal tissues: Left superior cerebellar infarct.   DISC LEVELS: C2-C3: Disc bulge with endplate osteophytes. Uncovertebral and facet hypertrophy. C3-C4: Disc bulge with endplate osteophytes. Uncovertebral and facet hypertrophy. Mild canal stenosis. Marked foraminal stenosis. C4-C5: Disc bulge w   Allergy    Anemia    Anxiety    Arthritis    Back pain    Coronary artery disease    Depression    History of stroke 10/02/2020   Hypertension    Peripheral vascular disease    Personal history of nicotine dependence 10/02/2020   Prsnl hx of TIA (TIA), and cereb infrc w/o resid  deficits 10/02/2020   Stroke [place] [facility])    Thyroid  disease     Social History:  Social History   Socioeconomic History   Marital status: Widowed    Spouse name: Not on file   Number of children: 3   Years of education: Not on file   Highest education level: Not on file  Occupational History   Not on file  Tobacco Use   Smoking status: Former    Current packs/day: 0.00    Types: Cigarettes    Start date: 05/1990    Quit date: 05/2020    Years since quitting: 4.2   Smokeless tobacco: Never   Tobacco comments:    Smoked about 1 pack per month  Vaping Use   Vaping status: Never Used  Substance and Sexual Activity   Alcohol use: Not Currently   Drug use: Yes    Types: Hydrocodone    Sexual activity: Not Currently    Birth control/protection: None  Other Topics Concern   Not on file  Social  History Narrative   ** Merged History Encounter **       Social Drivers of Health   Financial Resource Strain: Low Risk  (08/20/2023)   Overall Financial Resource Strain (CARDIA)    Difficulty of Paying Living Expenses: Not hard at all  Food Insecurity: No Food Insecurity (04/24/2024)   Hunger Vital Sign    Worried About Running Out of Food in the Last Year: Never true    Ran Out of Food in the Last Year: Never true  Transportation Needs: No Transportation Needs (04/24/2024)   PRAPARE - Administrator, Civil Service (Medical): No    Lack of Transportation (Non-Medical): No  Physical Activity: Insufficiently Active (08/20/2023)   Exercise Vital Sign    Days of Exercise per Week: 7 days    Minutes of Exercise per Session: 20 min  Stress: No Stress Concern Present (08/20/2023)   [employer] of Occupational Health - Occupational Stress Questionnaire    Feeling of Stress : Not at all  Social Connections: Moderately Isolated (04/02/2024)   Social Connection and Isolation Panel    Frequency of Communication with Friends and Family: More than three times a week    Frequency of Social Gatherings with Friends and Family: More than three times a week    Attends Religious Services: 1 to 4 times per year    Active Member of [employer] or Organizations: No    Attends Banker Meetings: Never    Marital Status: Widowed  Intimate Partner Violence: Not At Risk (04/24/2024)   Humiliation, Afraid, Rape, and Kick questionnaire    Fear of Current or Ex-Partner: No    Emotionally Abused: No    Physically Abused: No    Sexually Abused: No    Medications:   Current Outpatient Medications on File Prior to Visit  Medication Sig Dispense Refill   atorvastatin  (LIPITOR) 40 MG tablet Take 1 tablet (40 mg total) by mouth at bedtime. 90 tablet 3   BIOFREEZE ROLL-ON 4 % GEL Apply 1 Application topically 3 (three) times daily.     CALCIUM  PO Take 1 tablet by mouth daily.      Cholecalciferol (VITAMIN D -3) 25 MCG (1000 UT) CAPS Take 1,000 Units by mouth daily.     clopidogrel  (PLAVIX ) 75 MG tablet Take 1 tablet (75 mg total) by mouth daily. 90 tablet 3   cyanocobalamin  (VITAMIN B12) 500 MCG tablet Take 500 mcg by mouth daily.  diphenhydrAMINE (BENADRYL) 25 MG tablet Take 25 mg by mouth at bedtime as needed for allergies.     divalproex  (DEPAKOTE ) 500 MG DR tablet Take 1 tablet (500 mg total) by mouth at bedtime. 90 tablet 3   famotidine  (PEPCID  AC) 10 MG tablet Take 10 mg by mouth 2 (two) times daily.     ferrous sulfate  (FEROSUL) 325 (65 FE) MG tablet Take 1 tablet by mouth once daily 90 tablet 1   furosemide  (LASIX ) 20 MG tablet Take 1 tablet (20 mg total) by mouth daily as needed (for swelling).     HYDROcodone -acetaminophen  (NORCO/VICODIN) 5-325 MG tablet Take 1 tablet by mouth 2 (two) times daily as needed for severe pain (pain score 7-10). Must last 30 days. 60 tablet 0   HYDROcodone -acetaminophen  (NORCO/VICODIN) 5-325 MG tablet Take 1 tablet by mouth 2 (two) times daily as needed for severe pain (pain score 7-10). Must last 30 days. 60 tablet 0   HYDROcodone -acetaminophen  (NORCO/VICODIN) 5-325 MG tablet Take 1 tablet by mouth 2 (two) times daily as needed for severe pain (pain score 7-10). Must last 30 days. 60 tablet 0   hydroxychloroquine  (PLAQUENIL ) 200 MG tablet Take 200 mg by mouth 2 (two) times daily.     levothyroxine  (SYNTHROID ) 137 MCG tablet Take 1 tablet (137 mcg total) by mouth daily before breakfast. 90 tablet 0   loratadine  (EQ ALL DAY ALLERGY RELIEF) 10 MG tablet Take 1 tablet by mouth once daily 30 tablet 5   losartan  (COZAAR ) 25 MG tablet Take 1/2 (one-half) tablet by mouth once daily 45 tablet 2   magnesium  oxide (MAG-OX) 400 (240 Mg) MG tablet Take 400 mg by mouth daily.     metoprolol  succinate (TOPROL -XL) 25 MG 24 hr tablet Take 12.5 mg by mouth daily.     naloxone  (NARCAN ) nasal spray 4 mg/0.1 mL Place 1 spray into the nose as needed for up  to 365 doses (for opioid-induced respiratory depresssion). In case of emergency (overdose), spray once into each nostril. If no response within 3 minutes, repeat application and call 911. 1 each 0   pantoprazole  (PROTONIX ) 40 MG tablet TAKE 1 TABLET BY MOUTH TWICE A DAY 180 tablet 1   polyethylene glycol (MIRALAX  / GLYCOLAX ) 17 g packet Take 17 g by mouth daily as needed. 14 each 0   traZODone  (DESYREL ) 100 MG tablet TAKE 1 TABLET BY MOUTH AT BEDTIME AS NEEDED FOR SLEEP 90 tablet 0   No current facility-administered medications on file prior to visit.    Allergies:  No Known Allergies  Physical Exam General: well developed, well nourished pleasant elderly Caucasian lady, seated, in no evident distress Head: head normocephalic and atraumatic.   Neck: supple with no carotid or supraclavicular bruits Cardiovascular: regular rate and rhythm, no murmurs Musculoskeletal: no deformity Skin:  no rash/petichiae Vascular:  Normal pulses all extremities  Neurologic Exam Mental Status: Awake and fully alert. Oriented to place and time. Recent and remote memory intact. Attention span, concentration and fund of knowledge appropriate. Mood and affect appropriate.  Diminished recall 1/3.  Able to name only 5 animals which can walk on 4 legs.  Clock drawing 3/4.MMSE  19/30 ( last visit 26/30) .  Geriatric depression scale is 6. Did not draw a clock Cranial Nerves: Fundoscopic exam not done. Pupils equal, briskly reactive to light. Extraocular movements full without nystagmus. Visual fields full to confrontation. Hearing is diminished bilaterally. Facial sensation intact. Face, tongue, palate moves normally and symmetrically.  Motor: Normal bulk and  tone. Normal strength in all tested extremity muscles. Sensory.: intact to touch , pinprick , position and vibratory sensation.  Coordination: Rapid alternating movements normal in all extremities. Finger-to-nose and heel-to-shin performed accurately  bilaterally. Gait and Station: Arises from chair without difficulty. Stance is broad-based .  Uses a wheeled walker.  Gait is imbalance particularly while turning  reflexes: 1+ and symmetric. Toes downgoing.      07/14/2024    1:06 PM 08/20/2023    1:59 PM 03/29/2023    1:36 PM  MMSE - Mini Mental State Exam  Not completed:  Unable to complete   Orientation to time 4  3  Orientation to Place 3  5  Registration 3  3  Attention/ Calculation 0  5  Recall 1  3  Language- name 2 objects 2  2  Language- repeat 1  0  Language- follow 3 step command 3  3  Language- read & follow direction 1  1  Write a sentence 1  1  Copy design 0  0  Total score 19  26        ASSESSMENT: 83 year old Caucasian lady with brief episode of altered consciousness with generalized body stiffness and foaming of the mouth followed by transient facial droop slurred speech and right-sided weakness-strokelike episode unclear as to syncope versus seizure or TIA.  Vascular risk factors of chronic atrial fibrillation s/p Watchman procedure in March 2024, hypertension, hyperlipidemia and mild obesity.  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  Continue Plavix  for stroke prevention given history recent Watchman device for A-fib. Maintain aggressive risk factor modification with strict control of hypertension with blood pressure goal below 130/90, lipids with LDL cholesterol goal below 70 mg percent and diabetes with hemoglobin A1c goal below 6.5%.  I recommended trial of Namenda for memory loss and also encouraged patient to increase participation in cognitively challenging activities like solving  crossword puzzles, playing bridge and sudoku.  We also discussed memory compensation strategies.  I encouraged her to continue ongoing home physical therapy and use a cane at all times and discussed fall safety precautions.     Return for follow-up in the future in 6 months with my nurse practitioner or call earlier if necessary.   I personally spent a total of 50 minutes in the care of the patient today including getting/reviewing separately obtained history, performing a medically appropriate exam/evaluation, counseling and educating, placing orders, referring and communicating with other health care professionals, documenting clinical information in the EHR, independently interpreting results, and coordinating care.      Eather Popp, MD  Note: This document was prepared with digital dictation and possible smart phrase technology. Any transcriptional errors that result from this process are unintentional.

## 2024-07-15 ENCOUNTER — Ambulatory Visit: Attending: Nurse Practitioner | Admitting: Nurse Practitioner

## 2024-07-15 ENCOUNTER — Encounter: Payer: Self-pay | Admitting: Nurse Practitioner

## 2024-07-15 DIAGNOSIS — Z79899 Other long term (current) drug therapy: Secondary | ICD-10-CM | POA: Insufficient documentation

## 2024-07-15 DIAGNOSIS — M79605 Pain in left leg: Secondary | ICD-10-CM | POA: Insufficient documentation

## 2024-07-15 DIAGNOSIS — M79604 Pain in right leg: Secondary | ICD-10-CM | POA: Insufficient documentation

## 2024-07-15 DIAGNOSIS — M47816 Spondylosis without myelopathy or radiculopathy, lumbar region: Secondary | ICD-10-CM | POA: Insufficient documentation

## 2024-07-15 DIAGNOSIS — M545 Low back pain, unspecified: Secondary | ICD-10-CM | POA: Insufficient documentation

## 2024-07-15 DIAGNOSIS — G894 Chronic pain syndrome: Secondary | ICD-10-CM | POA: Insufficient documentation

## 2024-07-15 DIAGNOSIS — Z79891 Long term (current) use of opiate analgesic: Secondary | ICD-10-CM | POA: Diagnosis not present

## 2024-07-15 DIAGNOSIS — G8929 Other chronic pain: Secondary | ICD-10-CM | POA: Insufficient documentation

## 2024-07-15 DIAGNOSIS — M542 Cervicalgia: Secondary | ICD-10-CM | POA: Diagnosis not present

## 2024-07-15 LAB — DEMENTIA PANEL
Homocysteine: 11.4 umol/L (ref 0.0–21.3)
RPR Ser Ql: NONREACTIVE
TSH: 3.82 u[IU]/mL (ref 0.450–4.500)
Vitamin B-12: 999 pg/mL (ref 232–1245)

## 2024-07-15 MED ORDER — HYDROCODONE-ACETAMINOPHEN 5-325 MG PO TABS: 1.0000 | ORAL_TABLET | Freq: Two times a day (BID) | ORAL | 0 refills | Status: DC | PRN

## 2024-07-15 NOTE — Progress Notes (Signed)
 Nursing Pain Medication Assessment:  Safety precautions to be maintained throughout the outpatient stay will include: orient to surroundings, keep bed in low position, maintain call bell within reach at all times, provide assistance with transfer out of bed and ambulation.  Medication Inspection Compliance: Carmen [name] did not comply with our request to bring her pills to be counted. She was reminded that bringing the medication bottles, even when empty, is a requirement.  Medication: None brought in. Pill/Patch Count: None available to be counted. Bottle Appearance: No container available. Did not bring bottle(s) to appointment. Filled Date: N/A Last Medication intake:  Today

## 2024-07-15 NOTE — Progress Notes (Signed)
 PROVIDER NOTE: Interpretation of information contained herein should be left to medically-trained personnel. Specific patient instructions are provided elsewhere under Patient Instructions section of medical record. This document was created in part using AI and STT-dictation technology, any transcriptional errors that may result from this process are unintentional.  Patient: Carmen [name]  Service: E/M   PCP: Corwin Antu, FNP  DOB: November 18, 1940  DOS: 07/15/2024  Provider: Emmy MARLA Blanch, NP  MRN: [id number]  Delivery: Face-to-face  Specialty: Interventional Pain Management  Type: Established Patient  Setting: Ambulatory outpatient facility  Specialty designation: 09  Referring Prov.: Corwin Antu, FNP  Location: Outpatient office facility       History of present illness (HPI) Carmen [name], a 83 y.o. year old female, is here today because of her Left hip pain and low back pain. 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  Pain Assessment: Severity of Chronic pain is reported as a 7 /10. Location: Back Right, Lower/Radiates down right hip. Onset: More than a month ago. Quality: Throbbing. Timing: Intermittent. Modifying factor(s): Heat. Vitals:  height is 5' (1.524 m) and weight is 175 lb (79.4 kg). Her temporal temperature is 97.1 F (36.2 C) (abnormal). Her blood pressure is 162/75 (abnormal) and her pulse is 57 (abnormal). Her respiration is 18 and oxygen saturation is 100%.  BMI: Estimated body mass index is 34.18 kg/m as calculated from the following:   Height as of this encounter: 5' (1.524 m).   Weight as of this encounter: 175 lb (79.4 kg).  Last encounter: 04/15/2024. Last procedure: Visit date not  found.  Reason for encounter: medication management. The patient indicates doing well with the current medication regimen. No adverse reaction or side effects reported to the medication. Patient compliance with her medication regimen and prescription drug monitoring program (PDMP). 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   Discussed the use of AI scribe software for clinical note transcription with the patient, who gave verbal consent to proceed.  History of Present Illness   Carmen [name] is an 83 year old female with rheumatoid arthritis who presents with hip and back pain.  She experiences persistent pain in her left hip, which has been a long-standing issue. She has previously received several injections for this pain, but they did not provide long-lasting relief. Currently, she manages the pain with pain medication regimen. She presents with her daughter at today's visit.   She also has low back pain that spans across her lower back. She and her family report that she hurt her back during her job and that this came up on x-rays. Over the past four years, her condition has improved significantly with medication prescribed by an arthritis doctor, physical therapy, and pain medication.  She has been diagnosed with rheumatoid arthritis and is under the care of a rheumatologist at the [facility]. She takes medication for this condition twice daily. She has experienced a weight loss of 25 pounds, which she attributes to managing her arthritis and a previous procedure involving a watchman device.  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 She is under the care of a vein doctor and a heart doctor for these issues.  She reports feeling sleepy as a  side effect of her pain medication. Her pharmacy remains the [facility] in [place].  The patient had a fall this morning before leaving home; however, she reports no injuries or loss of  consciousness.  Due to this incident, her daughter forgot to bring pill bottles for the pill count.  The patient was advised to bring her pill bottles at the next visit.    Pharmacotherapy Assessment   Hydrocodone -acetaminophen  (Norco/Vicodin) 5-325 mg 1 tab 2 times daily as needed for pain. MME=10  Monitoring: Carmen [name] PMP: PDMP reviewed during this encounter.       Pharmacotherapy: No side-effects or adverse reactions reported. Compliance: No problems identified. Effectiveness: Clinically acceptable.  Carmen [name], [place]  07/15/2024  9:59 AM  Sign when Signing Visit Nursing Pain Medication Assessment:  Safety precautions to be maintained throughout the outpatient stay will include: orient to surroundings, keep bed in low position, maintain call bell within reach at all times, provide assistance with transfer out of bed and ambulation.  Medication Inspection Compliance: Carmen [name] did not comply with our request to bring her pills to be counted. She was reminded that bringing the medication bottles, even when empty, is a requirement.  Medication: None brought in. Pill/Patch Count: None available to be counted. Bottle Appearance: No container available. Did not bring bottle(s) to appointment. Filled Date: N/A Last Medication intake:  Today    UDS:  Summary  Date Value Ref Range Status  04/11/2023 Note  Final    Comment:    ==================================================================== [employer] Select 13 (MW) ==================================================================== Test                             Result       Flag       Units  Drug Present and Declared for Prescription Verification   Hydrocodone                     109          EXPECTED   ng/mg creat   Dihydrocodeine                 74           EXPECTED   ng/mg creat   Norhydrocodone                 432          EXPECTED   ng/mg creat    Sources of hydrocodone  include scheduled prescription medications.     Dihydrocodeine and norhydrocodone are expected metabolites of    hydrocodone . Dihydrocodeine is also available as a scheduled    prescription medication.  ==================================================================== Test                      Result    Flag   Units      Ref Range   Creatinine              78               mg/dL      >=79 ==================================================================== Declared Medications:  The flagging and interpretation on this report are based on the  following declared medications.  Unexpected results may arise from  inaccuracies in the declared medications.   **Note: The testing scope of this panel includes these medications:   Hydrocodone  (Norco)   **Note: The testing scope of this panel does not include the  following reported  medications:   Acetaminophen  (Norco)  Atorvastatin  (Lipitor)  Calcium   Clopidogrel  (Plavix )  Diphenhydramine (Benadryl)  Divaleproex (Depakote )  Famotidine  (Pepcid )  Furosemide  (Lasix )  Hydroxychloroquine  (Plaquenil )  Levothyroxine  (Synthroid )  Loratadine  (Claritin )  Losartan  (Cozaar )  Magnesium  (Mag-Ox)  Metoprolol  (Toprol )  Naloxone  (Narcan )  Trazodone  (Desyrel )  Vitamin B12  Vitamin D3 ==================================================================== For clinical consultation, please call [phone]. ====================================================================     No results found for: CBDTHCR No results found for: D8THCCBX No results found for: D9THCCBX  ROS  Constitutional: Denies any fever or chills Gastrointestinal: No reported hemesis, hematochezia, vomiting, or acute GI distress Musculoskeletal: Left hip pain, low back pain Neurological: No reported episodes of acute onset apraxia, aphasia, dysarthria, agnosia, amnesia, paralysis, loss of coordination, or loss of consciousness  Medication Review  Calcium , HYDROcodone -acetaminophen , Menthol (Topical  Analgesic), Vitamin D -3, atorvastatin , clopidogrel , cyanocobalamin , diphenhydrAMINE, divalproex , famotidine , furosemide , hydroxychloroquine , levothyroxine , loratadine , losartan , magnesium  oxide, memantine, metoprolol  succinate, naloxone , pantoprazole , polyethylene glycol, and traZODone   History Review  Allergy: Ms. Langford has no known allergies. Drug: Ms. Bouch  reports current drug use. Drug: Hydrocodone . Alcohol:  reports that she does not currently use alcohol. Tobacco:  reports that she quit smoking about 4 years ago. Her smoking use included cigarettes. She started smoking about 34 years ago. She has never used smokeless tobacco. Social: Ms. Mckeough  reports that she quit smoking about 4 years ago. Her smoking use included cigarettes. She started smoking about 34 years ago. She has never used smokeless tobacco. She reports that she does not currently use alcohol. She reports current drug use. Drug: Hydrocodone . 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 Surgical: Ms. Kerstein  has a past surgical history that includes Cardiac catheterization; Coronary angioplasty; Coronary artery bypass graft; back injections; Cataract extraction; LEFT ATRIAL APPENDAGE OCCLUSION (N/A, 12/28/2022); and TEE without cardioversion (N/A, 12/28/2022). Family: family history includes Bipolar disorder in her daughter; Breast cancer in her mother; Heart attack in her father.  Laboratory Chemistry Profile   Renal Lab Results  Component Value Date   BUN 13 07/07/2024   CREATININE 0.81 07/07/2024   BCR 19  03/28/2024   GFR 67.55 07/07/2024   GFRAA 73 11/22/2020   GFRNONAA >60 04/02/2024    Hepatic Lab Results  Component Value Date   AST 19 07/07/2024   ALT 17 07/07/2024   ALBUMIN 4.2 07/07/2024   ALKPHOS 52 07/07/2024   AMMONIA 19 10/06/2020    Electrolytes Lab Results  Component Value Date   NA 140 07/07/2024   K 4.1 07/07/2024   CL 101 07/07/2024   CALCIUM  9.1 07/07/2024   MG 1.9 03/28/2024    Bone Lab Results  Component Value Date   25OHVITD1 12 (L) 03/14/2021   25OHVITD2 <1.0 03/14/2021   25OHVITD3 12 03/14/2021    Inflammation (CRP: Acute Phase) (ESR: Chronic Phase) Lab Results  Component Value Date   CRP 0.8 03/14/2021   ESRSEDRATE 3 08/01/2022   LATICACIDVEN 1.2 12/18/2023         Note: Above Lab results reviewed.  Recent Imaging Review  DG Abd 1 View EXAM: 1 VIEW XRAY OF THE ABDOMEN 07/07/2024 01:38:54 PM  CLINICAL HISTORY: abdominal pain, constipation. abdominal pain, constipation  FINDINGS:  BOWEL: Moderate colonic stool burden. Nonobstructive bowel gas pattern.  SOFT TISSUES: Atherosclerotic vascular calcifications.  BONES: Multilevel degenerative changes in visualized lumbar spine. No acute osseous abnormality.  IMPRESSION: 1. Moderate colonic stool burden.  Electronically signed by: Dorethia Molt MD 07/10/2024 11:33 PM EDT RP Workstation: [id number] Note: Reviewed        Physical Exam  Vitals: BP (!) 162/75 (BP Location: Right Arm, Patient Position: Sitting, Cuff Size: Normal)   Pulse (!) 57   Temp (!) 97.1 F (36.2 C) (Temporal)   Resp 18   Ht 5' (1.524 m)   Wt 175 lb (79.4 kg)   SpO2 100%   BMI 34.18 kg/m  BMI: Estimated body mass index is 34.18 kg/m as calculated from the following:   Height as of this encounter: 5' (1.524 m).   Weight as of this encounter: 175 lb (79.4 kg). Ideal: Ideal body weight: 45.5 kg (100 lb 4.9 oz) Adjusted ideal body weight: 59.1 kg (130 lb 3 oz) General appearance: Well nourished, well  developed, and well hydrated. In no apparent acute distress Mental status: Alert, oriented x 3 (person, place, & time)       Respiratory: No evidence of acute respiratory distress Eyes: PERLA  Musculoskeletal: Left hip pain + LBP Assessment   Diagnosis Status  1. Chronic low back pain (1ry area of Pain) (Bilateral) (L>R) w/o sciatica   2. Chronic lower extremity pain (3ry area of Pain) (Bilateral) (L>R)   3. Chronic pain syndrome   4. Pharmacologic therapy   5. Encounter for medication management   6. Chronic neck pain (2ry area of Pain) (Bilateral) (L>R)   7. Lumbar facet joint syndrome (Bilateral)   8. Chronic use of opiate for therapeutic purpose   9. Encounter for chronic pain management    Controlled Controlled Controlled   Updated Problems: No problems updated.  Plan of Care  Problem-specific:  Assessment and Plan    Chronic pain syndrome with low back pain and left hip pain Chronic pain in the left hip and low back managed with medication and physical therapy, showing improvement. Pain exacerbated by walking, with reduced endurance. Daytime sleepiness noted as a medication side effect. - Continue current pain management regimen including medication and physical therapy.  Chronic pain syndrome: Patient's pain is well-controlled with hydrocodone , will continue on current medication regimen.  Prescribing drug monitoring (PDMP) reviewed; findings consistent with the use of prescribed medication and no evidence of narcotic misuse or abuse.  Routine UDS ordered today (drug serum due to unable to provide urine).  Schedule follow-up in 90 days for medication management.  Rheumatoid arthritis Rheumatoid arthritis managed by a rheumatologist with improved symptoms. Weight loss of 25 pounds may benefit management. Daytime sleepiness noted. - Continue current arthritis medication as prescribed by the rheumatologist.       Ms. Laisa Muecke has a current medication list which  includes the following long-term medication(s): atorvastatin , calcium , diphenhydramine, divalproex , famotidine , furosemide , levothyroxine , eq all day allergy relief, losartan , memantine, metoprolol  succinate, naloxone , pantoprazole , trazodone , [START ON 08/09/2024] hydrocodone -acetaminophen , [START ON 09/08/2024] hydrocodone -acetaminophen , and [START ON 10/08/2024] hydrocodone -acetaminophen .  Pharmacotherapy (Medications Ordered): Meds ordered this encounter  Medications   HYDROcodone -acetaminophen  (NORCO/VICODIN) 5-325 MG tablet    Sig: Take 1 tablet by mouth 2 (two) times daily as needed for severe pain (pain score 7-10). Must last 30 days.    Dispense:  60 tablet    Refill:  0    DO NOT: delete (not duplicate); no partial-fill (will deny script to  complete), no refill request (F/U required). DISPENSE: 1 day early if closed on fill date. WARN: No CNS-depressants within 8 hrs of med.   HYDROcodone -acetaminophen  (NORCO/VICODIN) 5-325 MG tablet    Sig: Take 1 tablet by mouth 2 (two) times daily as needed for severe pain (pain score 7-10). Must last 30 days.    Dispense:  60 tablet    Refill:  0    DO NOT: delete (not duplicate); no partial-fill (will deny script to complete), no refill request (F/U required). DISPENSE: 1 day early if closed on fill date. WARN: No CNS-depressants within 8 hrs of med.   HYDROcodone -acetaminophen  (NORCO/VICODIN) 5-325 MG tablet    Sig: Take 1 tablet by mouth 2 (two) times daily as needed for severe pain (pain score 7-10). Must last 30 days.    Dispense:  60 tablet    Refill:  0    DO NOT: delete (not duplicate); no partial-fill (will deny script to complete), no refill request (F/U required). DISPENSE: 1 day early if closed on fill date. WARN: No CNS-depressants within 8 hrs of med.   Orders:  Orders Placed This Encounter  Procedures   Drug Screen 10 W/Conf, Serum    Release to patient:   Immediate        Return in about 3 months (around 10/15/2024) for (F2F),  (MM), Emmy Blanch NP.    Recent Visits No visits were found meeting these conditions. Showing recent visits within past 90 days and meeting all other requirements Today's Visits Date Type Provider Dept  07/15/24 Office Visit Shalon Councilman K, NP [facility]  Showing today's visits and meeting all other requirements Future Appointments Date Type Provider Dept  10/07/24 Appointment Cortnee Steinmiller K, NP [facility]  Showing future appointments within next 90 days and meeting all other requirements  I discussed the assessment and treatment plan with the patient. The patient was provided an opportunity to ask questions and all were answered. The patient agreed with the plan and demonstrated an understanding of the instructions.  Patient advised to call back or seek an in-person evaluation if the symptoms or condition worsens.  I personally spent a total of 30 minutes in the care of the patient today including preparing to see the patient, getting/reviewing separately obtained history, performing a medically appropriate exam/evaluation, counseling and educating, placing orders, referring and communicating with other health care professionals, documenting clinical information in the EHR, independently interpreting results, communicating results, and coordinating care.   Note by: Leila Schuff K Zareya Tuckett, NP (TTS and AI technology used. I apologize for any typographical errors that were not detected and corrected.) Date: 07/15/2024; Time: 11:50 AM

## 2024-07-16 DIAGNOSIS — I509 Heart failure, unspecified: Secondary | ICD-10-CM | POA: Diagnosis not present

## 2024-07-16 DIAGNOSIS — M47817 Spondylosis without myelopathy or radiculopathy, lumbosacral region: Secondary | ICD-10-CM | POA: Diagnosis not present

## 2024-07-16 DIAGNOSIS — M4807 Spinal stenosis, lumbosacral region: Secondary | ICD-10-CM | POA: Diagnosis not present

## 2024-07-16 DIAGNOSIS — I480 Paroxysmal atrial fibrillation: Secondary | ICD-10-CM | POA: Diagnosis not present

## 2024-07-16 DIAGNOSIS — R1319 Other dysphagia: Secondary | ICD-10-CM | POA: Diagnosis not present

## 2024-07-16 DIAGNOSIS — E785 Hyperlipidemia, unspecified: Secondary | ICD-10-CM | POA: Diagnosis not present

## 2024-07-16 DIAGNOSIS — M4802 Spinal stenosis, cervical region: Secondary | ICD-10-CM | POA: Diagnosis not present

## 2024-07-16 DIAGNOSIS — M1611 Unilateral primary osteoarthritis, right hip: Secondary | ICD-10-CM | POA: Diagnosis not present

## 2024-07-16 DIAGNOSIS — E039 Hypothyroidism, unspecified: Secondary | ICD-10-CM | POA: Diagnosis not present

## 2024-07-16 DIAGNOSIS — G4700 Insomnia, unspecified: Secondary | ICD-10-CM | POA: Diagnosis not present

## 2024-07-16 DIAGNOSIS — M519 Unspecified thoracic, thoracolumbar and lumbosacral intervertebral disc disorder: Secondary | ICD-10-CM | POA: Diagnosis not present

## 2024-07-16 DIAGNOSIS — I739 Peripheral vascular disease, unspecified: Secondary | ICD-10-CM | POA: Diagnosis not present

## 2024-07-16 DIAGNOSIS — M4316 Spondylolisthesis, lumbar region: Secondary | ICD-10-CM | POA: Diagnosis not present

## 2024-07-16 DIAGNOSIS — F419 Anxiety disorder, unspecified: Secondary | ICD-10-CM | POA: Diagnosis not present

## 2024-07-16 DIAGNOSIS — H9193 Unspecified hearing loss, bilateral: Secondary | ICD-10-CM | POA: Diagnosis not present

## 2024-07-16 DIAGNOSIS — E66812 Obesity, class 2: Secondary | ICD-10-CM | POA: Diagnosis not present

## 2024-07-16 DIAGNOSIS — I2510 Atherosclerotic heart disease of native coronary artery without angina pectoris: Secondary | ICD-10-CM | POA: Diagnosis not present

## 2024-07-16 DIAGNOSIS — D649 Anemia, unspecified: Secondary | ICD-10-CM | POA: Diagnosis not present

## 2024-07-16 DIAGNOSIS — M47812 Spondylosis without myelopathy or radiculopathy, cervical region: Secondary | ICD-10-CM | POA: Diagnosis not present

## 2024-07-16 DIAGNOSIS — M069 Rheumatoid arthritis, unspecified: Secondary | ICD-10-CM | POA: Diagnosis not present

## 2024-07-16 DIAGNOSIS — G894 Chronic pain syndrome: Secondary | ICD-10-CM | POA: Diagnosis not present

## 2024-07-16 DIAGNOSIS — I110 Hypertensive heart disease with heart failure: Secondary | ICD-10-CM | POA: Diagnosis not present

## 2024-07-16 DIAGNOSIS — I83813 Varicose veins of bilateral lower extremities with pain: Secondary | ICD-10-CM | POA: Diagnosis not present

## 2024-07-16 DIAGNOSIS — F32A Depression, unspecified: Secondary | ICD-10-CM | POA: Diagnosis not present

## 2024-07-16 DIAGNOSIS — E538 Deficiency of other specified B group vitamins: Secondary | ICD-10-CM | POA: Diagnosis not present

## 2024-07-17 ENCOUNTER — Telehealth: Payer: Self-pay | Admitting: Family

## 2024-07-17 NOTE — Telephone Encounter (Signed)
 Copied from CRM #[id number]. Topic: Clinical - Medical Advice >> Jul 17, 2024  9:25 AM Donna BRAVO wrote: Reason for CRM: Shasta (Other) physical therapist [phone] [facility]   Patient fell in bathroom 07/15/24 hit left hip and arm. Patient did get up by herself, and is able move ok, did not hurt herself badly, no loss of consciousness, did not hit her head. Patient weight 179lb  a week ago was 174 Very slight  swelling in her legs Patient is doing okay,  Shasta stated she does not need to speak with nurse triage.  Ana stated to pass this message to Ginger Patrick FNP

## 2024-07-17 NOTE — Telephone Encounter (Signed)
 Pt to be triaged Pt is a heart patient and she could be having a potential HF flare with that amount of weight gain. Assess for HF symptoms.

## 2024-07-18 NOTE — Telephone Encounter (Signed)
 I spoke with [name](DPR signed); I told Atnonette about call from PT [facility] yesterday and info given by PT. I also advised what T Dugal FNP had noted. [name] said that pt was eating more sweets and her only symptom is feeling tired which pt has felt tired since iron  was low. pt has no CP or SOB and [name] said had not noticed swelling in lower extremities but pt does not always keep feet up when sitting.  [name] said she would call cardiologist office and talk with them about this and would proceed with card recommendation. UC & ED precautions given and pts daughter voiced understanding. Sending note to ONEIDA Patrick FNP who is out of office and CHRISTELLA Crandall NP who is in office.

## 2024-07-18 NOTE — Telephone Encounter (Signed)
 NOTED Thank you rena for calling.

## 2024-07-20 ENCOUNTER — Ambulatory Visit: Payer: Self-pay | Admitting: Neurology

## 2024-07-21 DIAGNOSIS — M47817 Spondylosis without myelopathy or radiculopathy, lumbosacral region: Secondary | ICD-10-CM | POA: Diagnosis not present

## 2024-07-21 DIAGNOSIS — M47812 Spondylosis without myelopathy or radiculopathy, cervical region: Secondary | ICD-10-CM | POA: Diagnosis not present

## 2024-07-21 DIAGNOSIS — H9193 Unspecified hearing loss, bilateral: Secondary | ICD-10-CM | POA: Diagnosis not present

## 2024-07-21 DIAGNOSIS — I2510 Atherosclerotic heart disease of native coronary artery without angina pectoris: Secondary | ICD-10-CM | POA: Diagnosis not present

## 2024-07-21 DIAGNOSIS — I509 Heart failure, unspecified: Secondary | ICD-10-CM | POA: Diagnosis not present

## 2024-07-21 DIAGNOSIS — E785 Hyperlipidemia, unspecified: Secondary | ICD-10-CM | POA: Diagnosis not present

## 2024-07-21 DIAGNOSIS — I110 Hypertensive heart disease with heart failure: Secondary | ICD-10-CM | POA: Diagnosis not present

## 2024-07-21 DIAGNOSIS — M1611 Unilateral primary osteoarthritis, right hip: Secondary | ICD-10-CM | POA: Diagnosis not present

## 2024-07-21 DIAGNOSIS — E66812 Obesity, class 2: Secondary | ICD-10-CM | POA: Diagnosis not present

## 2024-07-21 DIAGNOSIS — M4807 Spinal stenosis, lumbosacral region: Secondary | ICD-10-CM | POA: Diagnosis not present

## 2024-07-21 DIAGNOSIS — E039 Hypothyroidism, unspecified: Secondary | ICD-10-CM | POA: Diagnosis not present

## 2024-07-21 DIAGNOSIS — M069 Rheumatoid arthritis, unspecified: Secondary | ICD-10-CM | POA: Diagnosis not present

## 2024-07-21 DIAGNOSIS — M4802 Spinal stenosis, cervical region: Secondary | ICD-10-CM | POA: Diagnosis not present

## 2024-07-21 DIAGNOSIS — I739 Peripheral vascular disease, unspecified: Secondary | ICD-10-CM | POA: Diagnosis not present

## 2024-07-21 DIAGNOSIS — G4700 Insomnia, unspecified: Secondary | ICD-10-CM | POA: Diagnosis not present

## 2024-07-21 DIAGNOSIS — M4316 Spondylolisthesis, lumbar region: Secondary | ICD-10-CM | POA: Diagnosis not present

## 2024-07-21 DIAGNOSIS — M519 Unspecified thoracic, thoracolumbar and lumbosacral intervertebral disc disorder: Secondary | ICD-10-CM | POA: Diagnosis not present

## 2024-07-21 DIAGNOSIS — R1319 Other dysphagia: Secondary | ICD-10-CM | POA: Diagnosis not present

## 2024-07-21 DIAGNOSIS — E538 Deficiency of other specified B group vitamins: Secondary | ICD-10-CM | POA: Diagnosis not present

## 2024-07-21 DIAGNOSIS — D649 Anemia, unspecified: Secondary | ICD-10-CM | POA: Diagnosis not present

## 2024-07-21 DIAGNOSIS — F419 Anxiety disorder, unspecified: Secondary | ICD-10-CM | POA: Diagnosis not present

## 2024-07-21 DIAGNOSIS — I480 Paroxysmal atrial fibrillation: Secondary | ICD-10-CM | POA: Diagnosis not present

## 2024-07-21 DIAGNOSIS — F32A Depression, unspecified: Secondary | ICD-10-CM | POA: Diagnosis not present

## 2024-07-21 DIAGNOSIS — I83813 Varicose veins of bilateral lower extremities with pain: Secondary | ICD-10-CM | POA: Diagnosis not present

## 2024-07-21 DIAGNOSIS — G894 Chronic pain syndrome: Secondary | ICD-10-CM | POA: Diagnosis not present

## 2024-07-23 LAB — OXYCODONES,MS,WB/SP RFX
Oxycocone: NEGATIVE ng/mL
Oxycodones Confirmation: NEGATIVE
Oxymorphone: NEGATIVE ng/mL

## 2024-07-23 LAB — DRUG SCREEN 10 W/CONF, SERUM
Amphetamines, IA: NEGATIVE ng/mL
Barbiturates, IA: NEGATIVE ug/mL
Benzodiazepines, IA: NEGATIVE ng/mL
Cocaine & Metabolite, IA: NEGATIVE ng/mL
Methadone, IA: NEGATIVE ng/mL
Opiates, IA: POSITIVE ng/mL — AB
Oxycodones, IA: NEGATIVE ng/mL
Phencyclidine, IA: NEGATIVE ng/mL
Propoxyphene, IA: NEGATIVE ng/mL
THC(Marijuana) Metabolite, IA: NEGATIVE ng/mL

## 2024-07-23 LAB — OPIATES,MS,WB/SP RFX
6-Acetylmorphine: NEGATIVE
Codeine: NEGATIVE ng/mL
Dihydrocodeine: NEGATIVE ng/mL
Hydrocodone: 7.1 ng/mL
Hydromorphone: NEGATIVE ng/mL
Morphine: NEGATIVE ng/mL
Opiate Confirmation: POSITIVE

## 2024-07-25 DIAGNOSIS — I10 Essential (primary) hypertension: Secondary | ICD-10-CM | POA: Diagnosis not present

## 2024-07-25 DIAGNOSIS — F32A Depression, unspecified: Secondary | ICD-10-CM | POA: Diagnosis not present

## 2024-07-25 DIAGNOSIS — R296 Repeated falls: Secondary | ICD-10-CM | POA: Diagnosis not present

## 2024-07-28 ENCOUNTER — Ambulatory Visit: Admitting: Neurology

## 2024-07-28 DIAGNOSIS — F0150 Vascular dementia without behavioral disturbance: Secondary | ICD-10-CM

## 2024-07-28 DIAGNOSIS — R4182 Altered mental status, unspecified: Secondary | ICD-10-CM

## 2024-07-30 DIAGNOSIS — E785 Hyperlipidemia, unspecified: Secondary | ICD-10-CM | POA: Diagnosis not present

## 2024-07-30 DIAGNOSIS — I509 Heart failure, unspecified: Secondary | ICD-10-CM | POA: Diagnosis not present

## 2024-07-30 DIAGNOSIS — F32A Depression, unspecified: Secondary | ICD-10-CM | POA: Diagnosis not present

## 2024-07-30 DIAGNOSIS — M47812 Spondylosis without myelopathy or radiculopathy, cervical region: Secondary | ICD-10-CM | POA: Diagnosis not present

## 2024-07-30 DIAGNOSIS — M4316 Spondylolisthesis, lumbar region: Secondary | ICD-10-CM | POA: Diagnosis not present

## 2024-07-30 DIAGNOSIS — G4700 Insomnia, unspecified: Secondary | ICD-10-CM | POA: Diagnosis not present

## 2024-07-30 DIAGNOSIS — I2510 Atherosclerotic heart disease of native coronary artery without angina pectoris: Secondary | ICD-10-CM | POA: Diagnosis not present

## 2024-07-30 DIAGNOSIS — I110 Hypertensive heart disease with heart failure: Secondary | ICD-10-CM | POA: Diagnosis not present

## 2024-07-30 DIAGNOSIS — I83813 Varicose veins of bilateral lower extremities with pain: Secondary | ICD-10-CM | POA: Diagnosis not present

## 2024-07-30 DIAGNOSIS — E039 Hypothyroidism, unspecified: Secondary | ICD-10-CM | POA: Diagnosis not present

## 2024-07-30 DIAGNOSIS — F419 Anxiety disorder, unspecified: Secondary | ICD-10-CM | POA: Diagnosis not present

## 2024-07-30 DIAGNOSIS — H9193 Unspecified hearing loss, bilateral: Secondary | ICD-10-CM | POA: Diagnosis not present

## 2024-07-30 DIAGNOSIS — E538 Deficiency of other specified B group vitamins: Secondary | ICD-10-CM | POA: Diagnosis not present

## 2024-07-30 DIAGNOSIS — M47817 Spondylosis without myelopathy or radiculopathy, lumbosacral region: Secondary | ICD-10-CM | POA: Diagnosis not present

## 2024-07-30 DIAGNOSIS — M069 Rheumatoid arthritis, unspecified: Secondary | ICD-10-CM | POA: Diagnosis not present

## 2024-07-30 DIAGNOSIS — M519 Unspecified thoracic, thoracolumbar and lumbosacral intervertebral disc disorder: Secondary | ICD-10-CM | POA: Diagnosis not present

## 2024-07-30 DIAGNOSIS — G894 Chronic pain syndrome: Secondary | ICD-10-CM | POA: Diagnosis not present

## 2024-07-30 DIAGNOSIS — M1611 Unilateral primary osteoarthritis, right hip: Secondary | ICD-10-CM | POA: Diagnosis not present

## 2024-07-30 DIAGNOSIS — D649 Anemia, unspecified: Secondary | ICD-10-CM | POA: Diagnosis not present

## 2024-07-30 DIAGNOSIS — I739 Peripheral vascular disease, unspecified: Secondary | ICD-10-CM | POA: Diagnosis not present

## 2024-07-30 DIAGNOSIS — E66812 Obesity, class 2: Secondary | ICD-10-CM | POA: Diagnosis not present

## 2024-07-30 DIAGNOSIS — M4802 Spinal stenosis, cervical region: Secondary | ICD-10-CM | POA: Diagnosis not present

## 2024-07-30 DIAGNOSIS — R1319 Other dysphagia: Secondary | ICD-10-CM | POA: Diagnosis not present

## 2024-07-30 DIAGNOSIS — I480 Paroxysmal atrial fibrillation: Secondary | ICD-10-CM | POA: Diagnosis not present

## 2024-07-30 DIAGNOSIS — M4807 Spinal stenosis, lumbosacral region: Secondary | ICD-10-CM | POA: Diagnosis not present

## 2024-08-06 ENCOUNTER — Encounter: Payer: Self-pay | Admitting: Family

## 2024-08-11 NOTE — Telephone Encounter (Signed)
 Can we look into who she has physical therapy through? Not sure how to extend this, typically we wait for verbal orders don't we?

## 2024-08-13 ENCOUNTER — Encounter: Payer: Self-pay | Admitting: Internal Medicine

## 2024-08-13 ENCOUNTER — Ambulatory Visit: Attending: Internal Medicine | Admitting: Internal Medicine

## 2024-08-13 VITALS — BP 124/84 | HR 72 | Ht 60.0 in | Wt 177.0 lb

## 2024-08-13 DIAGNOSIS — I639 Cerebral infarction, unspecified: Secondary | ICD-10-CM

## 2024-08-13 DIAGNOSIS — I4819 Other persistent atrial fibrillation: Secondary | ICD-10-CM | POA: Diagnosis not present

## 2024-08-13 DIAGNOSIS — I25810 Atherosclerosis of coronary artery bypass graft(s) without angina pectoris: Secondary | ICD-10-CM | POA: Diagnosis not present

## 2024-08-13 DIAGNOSIS — I5032 Chronic diastolic (congestive) heart failure: Secondary | ICD-10-CM

## 2024-08-13 DIAGNOSIS — Z8673 Personal history of transient ischemic attack (TIA), and cerebral infarction without residual deficits: Secondary | ICD-10-CM

## 2024-08-13 DIAGNOSIS — I10 Essential (primary) hypertension: Secondary | ICD-10-CM

## 2024-08-13 DIAGNOSIS — E785 Hyperlipidemia, unspecified: Secondary | ICD-10-CM

## 2024-08-13 NOTE — Progress Notes (Signed)
 Cardiology Office Note:  .   Date:  08/13/2024  ID:  Carmen [name], DOB [birth year], MRN [id number] PCP: Carmen Antu, FNP  [facility] Providers Cardiologist:  Carmen Hanson, MD Electrophysiologist:  Carmen ONEIDA HOLTS, MD     History of Present Illness: .   Carmen [name] is a 83 y.o. female with history of coronary artery disease status post CABG (2005, LIMA-LAD, sequential SVG-D-OM, and SVG-PDA) and subsequent PCI due to failure of multiple grafts (Taxus stents to proximal LAD, mid LCx, and RCA), persistent atrial fibrillation status post left atrial appendage occlusion, chronic HFpEF, stroke, PAD, hypertension, hyperlipidemia, hypothyroidism, anemia, chronic pain syndrome, and depression/anxiety, who presents for follow-up of coronary artery disease and atrial fibrillation.  I last saw her in August, at which time she was feeling fairly well but reported intermittent falls, one of which led to scalp laceration that required suturing and PRBC transfusion.  She was still on DAPT despite prior recommendations by neurology to discontinue aspirin  after 3 months of therapy following her stroke in March.  She remained in sinus rhythm with improved heart rate and PR interval following de-escalation of metoprolol .  However, we agreed to decrease metoprolol  further in an effort to minimize the risk for further falls.  She was also advised to discontinue aspirin  and remain on clopidogrel  given history of prior Taxus stent placement.  Today, [name] reports that she feels about the same as at prior visits.  She still gets out of breath easily when walking.  She can only go about 100 yards before needing to rest.  This worsened around the time of her strokes in March but has been stable since then.  She notes a couple of slips since our last visit but no major falls.  She continues to get dizzy at times but has not passed out.  She denies chest pain.  She has occasional palpitations, most commonly when  lying in bed at night.  Chronic lower extremity edema is stable; she takes furosemide  about 4 days a week.  She just completed physical therapy but hopes to reenroll soon.  ROS: See HPI  Studies Reviewed: SABRA   EKG Interpretation Date/Time:  Wednesday August 13 2024 13:24:43 EST Ventricular Rate:  72 PR Interval:  176 QRS Duration:  84 QT Interval:  436 QTC Calculation: 477 R Axis:   67  Text Interpretation: Normal sinus rhythm Nonspecific ST abnormality Abnormal ECG When compared with ECG of 07-May-2024 14:07, Nonspecific T wave abnormality, improved in Lateral leads Confirmed by Sydell Prowell, Carmen [phone]) on 08/13/2024 1:29:09 PM    Event monitor (03/28/2024): Predominantly sinus rhythm with occasional PACs and rare PVCs.  A few brief episodes of NSVT and PSVT were also noted, lasting up to 6 and 8 beats, respectively.  Risk Assessment/Calculations:    CHA2DS2-VASc Score = 7   This indicates a 11.2% annual risk of stroke. The patient's score is based upon: CHF History: 0 HTN History: 1 Diabetes History: 0 Stroke History: 2 Vascular Disease History: 1 Age Score: 2 Gender Score: 1            Physical Exam:   VS:  BP 124/84 (BP Location: Left Arm, Patient Position: Sitting, Cuff Size: Large)   Pulse 72   Ht 5' (1.524 m)   Wt 177 lb (80.3 kg)   SpO2 97%   BMI 34.57 kg/m    Wt Readings from Last 3 Encounters:  08/13/24 177 lb (80.3 kg)  07/15/24 175 lb (79.4 kg)  07/14/24  179 lb 3.2 oz (81.3 kg)    General:  NAD. Neck: No JVD or HJR. Lungs: Clear to auscultation bilaterally without wheezes or crackles. Heart: Regular rate and rhythm with 1/6 systolic murmur. Abdomen: Soft, nontender, nondistended. Extremities: Trace pretibial edema bilaterally.  ASSESSMENT AND PLAN: .    Persistent atrial fibrillation: Ms. Pellicano is maintaining sinus rhythm today despite a long history of persistent atrial fibrillation.  We have previously de-escalated her metoprolol , which remains  at 12.5 mg daily.  Will plan to continue this as well as clopidogrel  in the setting of her strokes and prior Taxus stents.  She is not on anticoagulation given history of falls and associated bleeding with prior watchman implantation.  Coronary artery disease: No chest pain reported though exertional dyspnea could certainly be an anginal equivalent.  We have agreed to defer ischemia evaluation given her lack of chest pain and comorbidities.  Continue secondary prevention with clopidogrel  and atorvastatin .  Chronic HFpEF: Trace pretibial edema noted on exam today, which is stable.  Ms. Lawal continues to have NYHA class III symptoms that are likely multifactorial.  I think it is reasonable for her to continue her furosemide  20 mg daily as needed for edema/weight gain (she is using this about 4 times a week).  Continue metoprolol  succinate and losartan  for blood pressure control.  I think it is reasonable for her to continue with physical therapy, if possible, to help maintain/improve her stamina.  History of stroke: No new focal neurologic deficits reported.  Continue clopidogrel  monotherapy and high intensity statin therapy.  Defer resumption of anticoagulation in the setting of recurrent falls and prior left atrial appendage occlusion.  Hypertension: Blood pressure borderline today.  Defer escalation of antihypertensive therapy in an effort to minimize further dizziness and falls.  Hyperlipidemia: LDL well-controlled on last check in March.  Continue atorvastatin  40 mg for secondary prevention of CAD and PAD.    Dispo: Return to clinic in 6 months.  Signed, Carmen Hanson, MD

## 2024-08-13 NOTE — Patient Instructions (Signed)
 Medication Instructions:  Your physician recommends that you continue on your current medications as directed. Please refer to the Current Medication list given to you today.    *If you need a refill on your cardiac medications before your next appointment, please call your pharmacy*  Lab Work: No labs ordered today    Testing/Procedures: No test ordered today   Follow-Up: At [facility], you and your health needs are our priority.  As part of our continuing mission to provide you with exceptional heart care, our providers are all part of one team.  This team includes your primary Cardiologist (physician) and Advanced Practice Providers or APPs (Physician Assistants and Nurse Practitioners) who all work together to provide you with the care you need, when you need it.  Your next appointment:   6 month(s)  Provider:   You may see Sammy Crisp, MD or one of the following Advanced Practice Providers on your designated Care Team:   Laneta Pintos, NP Gildardo Labrador, PA-C Varney Gentleman, PA-C Cadence [place], PA-C Ronald Cockayne, NP Morey Ar, NP

## 2024-08-14 ENCOUNTER — Encounter: Payer: Self-pay | Admitting: Internal Medicine

## 2024-08-14 DIAGNOSIS — I5032 Chronic diastolic (congestive) heart failure: Secondary | ICD-10-CM | POA: Insufficient documentation

## 2024-08-19 NOTE — Telephone Encounter (Signed)
 open in error

## 2024-08-20 NOTE — Telephone Encounter (Signed)
 open in error

## 2024-09-02 ENCOUNTER — Other Ambulatory Visit: Payer: Self-pay | Admitting: Family

## 2024-09-02 DIAGNOSIS — R296 Repeated falls: Secondary | ICD-10-CM

## 2024-09-02 DIAGNOSIS — R29898 Other symptoms and signs involving the musculoskeletal system: Secondary | ICD-10-CM

## 2024-09-02 DIAGNOSIS — R2681 Unsteadiness on feet: Secondary | ICD-10-CM

## 2024-09-02 NOTE — Progress Notes (Signed)
Amb ref

## 2024-09-15 ENCOUNTER — Telehealth: Payer: Self-pay | Admitting: *Deleted

## 2024-09-15 NOTE — Telephone Encounter (Signed)
 This fax was received this morning and it was placed in Carmen [name]'s in box to be signed. I called [name] to make her aware of this information.

## 2024-09-15 NOTE — Telephone Encounter (Signed)
 Copied from CRM #[id number]. Topic: Clinical - Request for Lab/Test Order >> Sep 15, 2024  1:48 PM Gustabo D wrote: Kateityln with [facility]- Calling to see if ordered was received they sent it yesterday. Please be on the lookout for fax   Callback- [phone]

## 2024-09-16 ENCOUNTER — Other Ambulatory Visit: Payer: Self-pay | Admitting: Family

## 2024-09-16 DIAGNOSIS — G4700 Insomnia, unspecified: Secondary | ICD-10-CM

## 2024-10-04 NOTE — Progress Notes (Unsigned)
 PROVIDER NOTE: Interpretation of information contained herein should be left to medically-trained personnel. Specific patient instructions are provided elsewhere under Patient Instructions section of medical record. This document was created in part using AI and STT-dictation technology, any transcriptional errors that may result from this process are unintentional.  Patient: Carmen [name]  Service: E/M   PCP: Carmen Antu, FNP  DOB: 1941/05/26  DOS: 10/07/2024  Provider: Emmy MARLA Blanch, NP  MRN: [id number]  Delivery: Face-to-face  Specialty: Interventional Pain Management  Type: Established Patient  Setting: Ambulatory outpatient facility  Specialty designation: 09  Referring Prov.: Carmen Antu, FNP  Location: Outpatient office facility       History of present illness (HPI) [name], a 84 y.o. year old female, is here today because of her back pain and left heel pain. [name] primary complain today is Back Pain and left heel pain.   Pertinent problems: [name] has Paroxysmal atrial fibrillation (HCC); Chronic low back pain (1ry area of Pain) (Bilateral) (R>L) w/o sciatica; Dorsalgia, unspecified; Chronic anticoagulation (Eliquis ); Chronic pain syndrome; Pharmacologic therapy; Disorder of skeletal system; Chronic neck pain (2ry area of Pain) (Bilateral) (L>R); Chronic lower extremity pain (3ry area of Pain) (Bilateral) (R>L); Chronic hand pain (Left); Chronic hand pain (Right); Osteopenia determined by x-ray; Lumbar compression fracture (Old) (Multilevel), sequela; Chronic cervical radiculopathy (Bilateral); DDD (degenerative disc disease), cervical; Foraminal stenosis of cervical region; Lumbar facet syndrome (Bilateral); Lumbosacral facet hypertrophy (Multilevel) (Bilateral); Lumbar facet arthropathy (Multilevel) (Bilateral); 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HCC); Cervical facet syndrome (Bilateral); Cervical facet hypertrophy (Multilevel) (Bilateral); Spondylosis without  myelopathy or radiculopathy, cervical region; Spondylosis without myelopathy or radiculopathy, lumbosacral region; DDD (degenerative disc disease), lumbosacral; Coronary artery disease involving coronary bypass graft of native heart without angina pectoris; Levoscoliosis of lumbar spine; Lumbosacral foraminal stenosis (Multilevel) (Bilateral); Degenerative lateral spondylolisthesis of L4/L5 (3mm); Baastrup's syndrome; Chronic hip pain (Right); Greater trochanteric bursitis (Right); Chronic lower extremity pain (Right); and Osteoarthritis of hip (Right) on their pertinent problem list.  Pain Assessment: Severity of Chronic pain is reported as a 5 /10. Location: Back Lower/denies. Onset:  . Quality: Aching. Timing: Intermittent. Modifying factor(s): meds, proc. Vitals:  height is 5' (1.524 m) and weight is 179 lb (81.2 kg). Her temperature is 97.3 F (36.3 C) (abnormal). Her blood pressure is 160/65 (abnormal). Her respiration is 18 and oxygen saturation is 100%.  BMI: Estimated body mass index is 34.96 kg/m as calculated from the following:   Height as of this encounter: 5' (1.524 m).   Weight as of this encounter: 179 lb (81.2 kg).  Last encounter: 07/15/2024. Last procedure: Visit date not found.  Reason for encounter: medication management.  The patient indicates doing well with the current medication regimen. No adverse reaction or side effects reported to the medication. Patient compliance with her medication regimen and prescription drug monitoring program (PDMP). 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   Discussed the use of AI scribe software for clinical note transcription with the patient, who gave verbal consent to proceed.  History of Present Illness   Carmen [name] is an 84 year old female who presents with heel pain and low back pain.   She has been experiencing heel pain for the past week, described as 'shooting' and persistent throughout the day. The pain is localized to the back of  her left heel and is associated with swelling. She has been using water retention pills and compression socks to manage the swelling, although the socks are too tight for  comfort.  Her mobility has significantly decreased since March, and she has been receiving physical therapy at home through [facility] since that time. Despite these efforts, her mobility remains limited.  No side effects or adverse reactions from her current medications.     Pharmacotherapy Assessment   Hydrocodone -acetaminophen  (Norco/Vicodin) 5-325 mg 1 tab 2 times daily as needed for pain. MME=10  Monitoring: [place] PMP: PDMP reviewed during this encounter.       Pharmacotherapy: No side-effects or adverse reactions reported. Compliance: No problems identified. Effectiveness: Clinically acceptable.  Carmen [name], Carmen [name]  10/07/2024  8:24 AM  Sign when Signing Visit Nursing Pain Medication Assessment:  Safety precautions to be maintained throughout the outpatient stay will include: orient to surroundings, keep bed in low position, maintain call bell within reach at all times, provide assistance with transfer out of bed and ambulation.  Medication Inspection Compliance: Pill count conducted under aseptic conditions, in front of the patient. Neither the pills nor the bottle was removed from the patient's sight at any time. Once count was completed pills were immediately returned to the patient in their original bottle.  Medication: Hydrocodone /APAP Pill/Patch Count: 12 of 60 pills/patches remain Pill/Patch Appearance: Markings consistent with prescribed medication Bottle Appearance: Standard pharmacy container. Clearly labeled. Filled Date: 98 / 13 / 2026 Last Medication intake:  Yesterday    UDS:  Summary  Date Value Ref Range Status  04/11/2023 Note  Final    Comment:    ==================================================================== [employer] 13  (MW) ==================================================================== Test                             Result       Flag       Units  Drug Present and Declared for Prescription Verification   Hydrocodone                     109          EXPECTED   ng/mg creat   Dihydrocodeine                 74           EXPECTED   ng/mg creat   Norhydrocodone                 432          EXPECTED   ng/mg creat    Sources of hydrocodone  include scheduled prescription medications.    Dihydrocodeine and norhydrocodone are expected metabolites of    hydrocodone . Dihydrocodeine is also available as a scheduled    prescription medication.  ==================================================================== Test                      Result    Flag   Units      Ref Range   Creatinine              78               mg/dL      >=79 ==================================================================== Declared Medications:  The flagging and interpretation on this report are based on the  following declared medications.  Unexpected results may arise from  inaccuracies in the declared medications.   **Note: The testing scope of this panel includes these medications:   Hydrocodone  (Norco)   **Note: The testing scope of this panel does not include the  following reported medications:   Acetaminophen  (Norco)  Atorvastatin  (Lipitor)  Calcium   Clopidogrel  (Plavix )  Diphenhydramine (Benadryl)  Divaleproex (Depakote )  Famotidine  (Pepcid )  Furosemide  (Lasix )  Hydroxychloroquine  (Plaquenil )  Levothyroxine  (Synthroid )  Loratadine  (Claritin )  Losartan  (Cozaar )  Magnesium  (Mag-Ox)  Metoprolol  (Toprol )  Naloxone  (Narcan )  Trazodone  (Desyrel )  Vitamin B12  Vitamin D3 ==================================================================== For clinical consultation, please call [phone]. ====================================================================     No results found for: CBDTHCR No results  found for: D8THCCBX No results found for: D9THCCBX  ROS  Constitutional: Denies any fever or chills Gastrointestinal: No reported hemesis, hematochezia, vomiting, or acute GI distress Musculoskeletal: Back Pain and left heel pain.  Neurological: No reported episodes of acute onset apraxia, aphasia, dysarthria, agnosia, amnesia, paralysis, loss of coordination, or loss of consciousness  Medication Review  Calcium , HYDROcodone -acetaminophen , Menthol (Topical Analgesic), Vitamin D -3, atorvastatin , clopidogrel , cyanocobalamin , diphenhydrAMINE, divalproex , famotidine , ferrous sulfate , furosemide , hydroxychloroquine , levothyroxine , loratadine , losartan , magnesium  oxide, memantine , metoprolol  succinate, naloxone , pantoprazole , polyethylene glycol, and traZODone   History Review  Allergy: [name] has no known allergies. Drug: [name]  reports current drug use. Drug: Hydrocodone . Alcohol:  reports that she does not currently use alcohol. Tobacco:  reports that she quit smoking about 4 years ago. Her smoking use included cigarettes. She started smoking about 34 years ago. She has never used smokeless tobacco. Social: [name]  reports that she quit smoking about 4 years ago. Her smoking use included cigarettes. She started smoking about 34 years ago. She has never used smokeless tobacco. She reports that she does not currently use alcohol. She reports current drug use. Drug: Hydrocodone . 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 Surgical: [name]  has a past surgical history  that includes Cardiac catheterization; Coronary angioplasty; Coronary artery bypass graft; back injections; Cataract extraction; LEFT ATRIAL APPENDAGE OCCLUSION (N/A, 12/28/2022); and TEE without cardioversion (N/A, 12/28/2022). Family: family history includes Bipolar disorder in her daughter; Breast cancer in her mother; Heart attack in her father.  Laboratory Chemistry Profile   Renal Lab Results  Component Value Date   BUN 13 07/07/2024   CREATININE 0.81 07/07/2024   BCR 19 03/28/2024   GFR 67.55 07/07/2024   GFRAA 73 11/22/2020   GFRNONAA >60 04/02/2024    Hepatic Lab Results  Component Value Date   AST 19 07/07/2024   ALT 17 07/07/2024   ALBUMIN 4.2 07/07/2024   ALKPHOS 52 07/07/2024   AMMONIA 19 10/06/2020    Electrolytes Lab Results  Component Value Date   NA 140 07/07/2024   K 4.1 07/07/2024   CL 101 07/07/2024   CALCIUM  9.1 07/07/2024   MG 1.9 03/28/2024    Bone Lab Results  Component Value Date   25OHVITD1 12 (L) 03/14/2021   25OHVITD2 <1.0 03/14/2021   25OHVITD3 12 03/14/2021    Inflammation (CRP: Acute Phase) (ESR: Chronic Phase) Lab Results  Component Value Date   CRP 0.8 03/14/2021   ESRSEDRATE 3 08/01/2022   LATICACIDVEN 1.2 12/18/2023         Note: Above Lab results reviewed.  Recent Imaging Review  EEG adult        [facility] [address] [place]. [place] [address] [phone]       Electroencephalogram Procedure  Note  [name] Date of Birth:  1941-03-19 Medical Record Number:  [id number]   Indications: Diagnostic Date of Procedure 07/28/2024 Medications: none Clinical history : 84 year old patient being evaluated for memory loss and seizures Technical Description  This study was performed using 17 channel digital electroencephalographic recording equipment. [employer] 10-20 electrode placement was used. The record was obtained with the patient awake drowsy and asleep.  The record is of fair technical  quality for purposes of interpretation.   Activation Procedures:  photic stimulation . EEG Description Awake: Alpha Activity: The waking state record contains a well-defined bi-occipital alpha rhythm of  moderate amplitude with a dominant frequency of 8 Hz. Reactivity is uncertain.  No paroxsymal activity, spikes, or sharp waves are noted. Technical component of study is adequate.  Slight excess jaw movements due to anxiety EKG tracing shows regular sinus rhythm Length of this recording is 27 minutes and 13 seconds  Sleep: With drowsiness, there is attenuation of the background alpha activity. As the patient enters into light sleep, vertex waves and symmetrical spindles are noted. K complexes are noted in sleep. Transition to the waking state is unremarkable.   Result of Activation Procedures: Hyperventilation: N/A. Photo Stimulation: No photic driving response is noted.  Summary Normal electroencephalogram, awake, asleep and with activation procedures. There are no focal lateralizing or epileptiform features. Note: Reviewed        Physical Exam  Vitals: BP (!) 160/65   Temp (!) 97.3 F (36.3 C)   Resp 18   Ht 5' (1.524 m)   Wt 179 lb (81.2 kg)   SpO2 100%   BMI 34.96 kg/m  BMI: Estimated body mass index is 34.96 kg/m as calculated from the following:   Height as of this encounter: 5' (1.524 m).   Weight as of this encounter: 179 lb (81.2 kg). Ideal: Ideal body weight: 45.5 kg (100 lb 4.9 oz) Adjusted ideal body weight: 59.8 kg (131 lb 12.6 oz) General appearance: Well nourished, well developed, and well hydrated. In no apparent acute distress Mental status: Alert, oriented x 3 (person, place, & time)       Respiratory: No evidence of acute respiratory distress Eyes: PERLA  Musculoskeletal: +LBP Left heel pain Assessment   Diagnosis Status  1. Chronic low back pain (1ry area of Pain) (Bilateral) (L>R) w/o sciatica   2. Chronic use of opiate for therapeutic purpose    3. Pharmacologic therapy   4. Chronic lower extremity pain (3ry area of Pain) (Bilateral) (L>R)   5. Chronic pain syndrome   6. Encounter for medication management   7. Chronic neck pain (2ry area of Pain) (Bilateral) (L>R)   8. Lumbar facet joint syndrome (Bilateral)   9. Encounter for chronic pain management   10. Left ankle swelling    Controlled Controlled Controlled   Updated Problems: No problems updated.   Plan of Care  Problem-specific:  Assessment and Plan    Chronic pain syndrome Exacerbation of heel pain likely due to plantar fasciitis. Persistent shooting pain with swelling possibly from water retention. No adverse medication reactions. - Encouraged leg elevation to reduce swelling. - Continue physical therapy at home through [facility]. - Sent medication refills to pharmacy for February 12th, March 14th, and April 13th.   Pharmacologic therapy: Patient's pain is well-controlled with hydrocodone , will continue on current medication regimen.  Prescribing drug monitoring (PDMP) reviewed; findings consistent with the use of prescribed medication and no evidence of narcotic misuse or abuse. Urine drug screening (UDS) up  to date. No side effects or adverse reaction reported to medication. Schedule follow-up in 90 days for medication management.   Chronic pain syndrome with low back pain and left hip pain Chronic pain in the left hip and low back managed with medication and physical therapy, showing improvement. Pain exacerbated by walking, with reduced endurance. Daytime sleepiness noted as a medication side effect. - Continue current pain management regimen including medication and physical therapy.      [name] has a current medication list which includes the following long-term medication(s): atorvastatin , calcium , diphenhydramine, divalproex , famotidine , furosemide , levothyroxine , eq all day allergy relief, losartan , memantine , metoprolol  succinate, naloxone ,  pantoprazole , trazodone , [START ON 11/13/2024] hydrocodone -acetaminophen , [START ON 12/13/2024] hydrocodone -acetaminophen , and [START ON 01/12/2025] hydrocodone -acetaminophen .  Pharmacotherapy (Medications Ordered): Meds ordered this encounter  Medications   HYDROcodone -acetaminophen  (NORCO/VICODIN) 5-325 MG tablet    Sig: Take 1 tablet by mouth 2 (two) times daily as needed for severe pain (pain score 7-10). Must last 30 days.    Dispense:  60 tablet    Refill:  0    DO NOT: delete (not duplicate); no partial-fill (will deny script to complete), no refill request (F/U required). DISPENSE: 1 day early if closed on fill date. WARN: No CNS-depressants within 8 hrs of med.   HYDROcodone -acetaminophen  (NORCO/VICODIN) 5-325 MG tablet    Sig: Take 1 tablet by mouth 2 (two) times daily as needed for severe pain (pain score 7-10). Must last 30 days.    Dispense:  60 tablet    Refill:  0    DO NOT: delete (not duplicate); no partial-fill (will deny script to complete), no refill request (F/U required). DISPENSE: 1 day early if closed on fill date. WARN: No CNS-depressants within 8 hrs of med.   HYDROcodone -acetaminophen  (NORCO/VICODIN) 5-325 MG tablet    Sig: Take 1 tablet by mouth 2 (two) times daily as needed for severe pain (pain score 7-10). Must last 30 days.    Dispense:  60 tablet    Refill:  0    DO NOT: delete (not duplicate); no partial-fill (will deny script to complete), no refill request (F/U required). DISPENSE: 1 day early if closed on fill date. WARN: No CNS-depressants within 8 hrs of med.   Orders:  No orders of the defined types were placed in this encounter.       Return in about 3 months (around 01/05/2025) for (F2F), (MM), Carmen Blanch NP.    Recent Visits Date Type Provider Dept  07/15/24 Office Visit Alexine Pilant K, NP [facility]  Showing recent visits within past 90 days and meeting all other requirements Today's Visits Date Type Provider Dept  10/07/24 Office  Visit Carmen [name] K, NP [facility]  Showing today's visits and meeting all other requirements Future Appointments No visits were found meeting these conditions. Showing future appointments within next 90 days and meeting all other requirements  I discussed the assessment and treatment plan with the patient. The patient was provided an opportunity to ask questions and all were answered. The patient agreed with the plan and demonstrated an understanding of the instructions.  Patient advised to call back or seek an in-person evaluation if the symptoms or condition worsens.  I personally spent a total of 30 minutes in the care of the patient today including preparing to see the patient, getting/reviewing separately obtained history, performing a medically appropriate exam/evaluation, counseling and educating, placing orders, referring and communicating with other health care professionals, documenting clinical information in  the EHR, independently interpreting results, communicating results, and coordinating care.   Note by: Vonette Grosso K Reyanna Baley, NP (TTS and AI technology used. I apologize for any typographical errors that were not detected and corrected.) Date: 10/07/2024; Time: 8:38 AM

## 2024-10-07 ENCOUNTER — Encounter: Payer: Self-pay | Admitting: Nurse Practitioner

## 2024-10-07 ENCOUNTER — Ambulatory Visit: Attending: Nurse Practitioner | Admitting: Nurse Practitioner

## 2024-10-07 VITALS — BP 160/65 | Temp 97.3°F | Resp 18 | Ht 60.0 in | Wt 179.0 lb

## 2024-10-07 DIAGNOSIS — Z79891 Long term (current) use of opiate analgesic: Secondary | ICD-10-CM | POA: Diagnosis present

## 2024-10-07 DIAGNOSIS — G8929 Other chronic pain: Secondary | ICD-10-CM | POA: Diagnosis present

## 2024-10-07 DIAGNOSIS — M47816 Spondylosis without myelopathy or radiculopathy, lumbar region: Secondary | ICD-10-CM | POA: Diagnosis present

## 2024-10-07 DIAGNOSIS — M79604 Pain in right leg: Secondary | ICD-10-CM | POA: Diagnosis present

## 2024-10-07 DIAGNOSIS — Z79899 Other long term (current) drug therapy: Secondary | ICD-10-CM | POA: Diagnosis present

## 2024-10-07 DIAGNOSIS — M79605 Pain in left leg: Secondary | ICD-10-CM | POA: Diagnosis present

## 2024-10-07 DIAGNOSIS — M545 Low back pain, unspecified: Secondary | ICD-10-CM | POA: Insufficient documentation

## 2024-10-07 DIAGNOSIS — G894 Chronic pain syndrome: Secondary | ICD-10-CM | POA: Insufficient documentation

## 2024-10-07 DIAGNOSIS — M25472 Effusion, left ankle: Secondary | ICD-10-CM | POA: Diagnosis present

## 2024-10-07 DIAGNOSIS — M542 Cervicalgia: Secondary | ICD-10-CM | POA: Insufficient documentation

## 2024-10-07 MED ORDER — HYDROCODONE-ACETAMINOPHEN 5-325 MG PO TABS: 1.0000 | ORAL_TABLET | Freq: Two times a day (BID) | ORAL | 0 refills | Status: AC | PRN

## 2024-10-07 NOTE — Progress Notes (Signed)
 Nursing Pain Medication Assessment:  Safety precautions to be maintained throughout the outpatient stay will include: orient to surroundings, keep bed in low position, maintain call bell within reach at all times, provide assistance with transfer out of bed and ambulation.  Medication Inspection Compliance: Pill count conducted under aseptic conditions, in front of the patient. Neither the pills nor the bottle was removed from the patient's sight at any time. Once count was completed pills were immediately returned to the patient in their original bottle.  Medication: Hydrocodone /APAP Pill/Patch Count: 12 of 60 pills/patches remain Pill/Patch Appearance: Markings consistent with prescribed medication Bottle Appearance: Standard pharmacy container. Clearly labeled. Filled Date: 17 / 13 / 2026 Last Medication intake:  Yesterday

## 2024-10-07 NOTE — Patient Instructions (Signed)

## 2024-10-09 ENCOUNTER — Encounter: Payer: Self-pay | Admitting: Family

## 2024-10-09 ENCOUNTER — Ambulatory Visit: Admitting: Family

## 2024-10-09 VITALS — BP 136/86 | HR 81 | Temp 98.2°F | Ht 60.0 in | Wt 181.2 lb

## 2024-10-09 DIAGNOSIS — E785 Hyperlipidemia, unspecified: Secondary | ICD-10-CM | POA: Diagnosis not present

## 2024-10-09 DIAGNOSIS — D509 Iron deficiency anemia, unspecified: Secondary | ICD-10-CM

## 2024-10-09 DIAGNOSIS — M722 Plantar fascial fibromatosis: Secondary | ICD-10-CM | POA: Diagnosis not present

## 2024-10-09 LAB — CBC WITH DIFFERENTIAL/PLATELET
Basophils Absolute: 0 K/uL (ref 0.0–0.1)
Basophils Relative: 0.6 % (ref 0.0–3.0)
Eosinophils Absolute: 0.2 K/uL (ref 0.0–0.7)
Eosinophils Relative: 3.5 % (ref 0.0–5.0)
HCT: 38.1 % (ref 36.0–46.0)
Hemoglobin: 12.7 g/dL (ref 12.0–15.0)
Lymphocytes Relative: 25.4 % (ref 12.0–46.0)
Lymphs Abs: 1.4 K/uL (ref 0.7–4.0)
MCHC: 33.3 g/dL (ref 30.0–36.0)
MCV: 92.2 fl (ref 78.0–100.0)
Monocytes Absolute: 0.6 K/uL (ref 0.1–1.0)
Monocytes Relative: 11.2 % (ref 3.0–12.0)
Neutro Abs: 3.3 K/uL (ref 1.4–7.7)
Neutrophils Relative %: 59.3 % (ref 43.0–77.0)
Platelets: 225 K/uL (ref 150.0–400.0)
RBC: 4.13 Mil/uL (ref 3.87–5.11)
RDW: 14.3 % (ref 11.5–15.5)
WBC: 5.5 K/uL (ref 4.0–10.5)

## 2024-10-09 LAB — FERRITIN: Ferritin: 25 ng/mL (ref 10.0–291.0)

## 2024-10-09 LAB — IBC PANEL
Iron: 47 ug/dL (ref 42–145)
Saturation Ratios: 13.5 % — ABNORMAL LOW (ref 20.0–50.0)
TIBC: 347.2 ug/dL (ref 250.0–450.0)
Transferrin: 248 mg/dL (ref 212.0–360.0)

## 2024-10-09 LAB — LIPID PANEL
Cholesterol: 127 mg/dL (ref 28–200)
HDL: 57.4 mg/dL
LDL Cholesterol: 42 mg/dL (ref 10–99)
NonHDL: 69.14
Total CHOL/HDL Ratio: 2
Triglycerides: 137 mg/dL (ref 10.0–149.0)
VLDL: 27.4 mg/dL (ref 0.0–40.0)

## 2024-10-09 NOTE — Progress Notes (Unsigned)
 "  Established Patient Office Visit  Subjective:      CC:  Chief Complaint  Patient presents with   Foot Pain    L foot    HPI: Carmen [name] is a 84 y.o. female presenting on 10/09/2024 for Foot Pain (L foot) .  Discussed the use of AI scribe software for clinical note transcription with the patient, who gave verbal consent to proceed.  History of Present Illness Carmen [name] is an 84 year old female who presents with new onset heel pain.  She has been experiencing new heel pain for the past two weeks, described as shooting and persistent throughout the day. The pain is localized to the back of her left heel and is associated with some swelling. It feels like 'needles.' Occasionally, the pain radiates up the leg into the calf, which she refers to as the 'problem leg' due to a previous crack that was never fixed. She has not seen a podiatrist for this issue yet.  She has a history of water retention and uses diuretics for management. Three weeks ago, she experienced swelling in her ankle after not drinking enough water during a visit to a friend. Increasing her diuretic use and water intake resolved the swelling within two days.  She has been experiencing shortness of breath since a stroke and fall in July, which limits her physical activity. She can walk only short distances, such as to one or two mailboxes. She has been attending therapy since last March to aid in her recovery.  She is currently on blood thinners and takes Tylenol  up to six times a day for pain management, along with a pain pill in the morning and at night. She has been using support stockings, which have provided some relief for her heel pain. No recent injuries to her ankle or heel.              Social history:  Relevant past medical, surgical, family and social history reviewed and updated as indicated. Interim medical history since our last visit reviewed.  Allergies and medications reviewed and  updated.  DATA REVIEWED: CHART IN EPIC     ROS: Negative unless specifically indicated above in HPI.   Current Medications[1]        Objective:        BP 136/86 (BP Location: Left Arm, Patient Position: Sitting, Cuff Size: Large)   Pulse 81   Temp 98.2 F (36.8 C) (Temporal)   Ht 5' (1.524 m)   Wt 181 lb 3.2 oz (82.2 kg)   SpO2 99%   BMI 35.39 kg/m   Physical Exam MUSCULOSKELETAL: Calf tenderness on palpation. Pain on palpation of the foot. Tenderness on palpation of the heel. Achilles tendon intact.  Wt Readings from Last 3 Encounters:  10/09/24 181 lb 3.2 oz (82.2 kg)  10/07/24 179 lb (81.2 kg)  08/13/24 177 lb (80.3 kg)    Physical Exam Vitals reviewed.  Constitutional:      General: She is not in acute distress.    Appearance: Normal appearance. She is normal weight. She is not ill-appearing, toxic-appearing or diaphoretic.  HENT:     Head: Normocephalic.  Cardiovascular:     Rate and Rhythm: Normal rate and regular rhythm.  Pulmonary:     Effort: Pulmonary effort is normal.     Breath sounds: Normal breath sounds.  Musculoskeletal:        General: Normal range of motion.     Right lower leg: No edema.  Left lower leg: No edema.  Neurological:     General: No focal deficit present.     Mental Status: She is alert and oriented to person, place, and time. Mental status is at baseline.  Psychiatric:        Mood and Affect: Mood normal.        Behavior: Behavior normal.        Thought Content: Thought content normal.        Judgment: Judgment normal.          Results Labs TSH (07/2024): Within normal limits Liver function tests (07/2024): Within normal limits A1c (07/2024): 5.7  Assessment & Plan:   Assessment and Plan Assessment & Plan Plantar fasciitis New onset heel pain for two weeks, described as shooting and persistent, localized to the back of the left heel with associated swelling. Differential includes heel sprain, arthritis,  and tendinitis. Achilles tendon intact. Low suspicion for blood clot due to anticoagulation therapy and absence of redness or significant swelling. Pain improved with support stockings. - Obtain orthotic inserts for plantar fasciitis. - Perform stretching exercises as provided. - Use ice therapy with a frozen water bottle. - Monitor for redness or increased swelling; if present, will consider ultrasound. - Will consider podiatry referral if symptoms worsen or do not improve with current interventions.  Hyperlipidemia Currently managed with medication.  Iron  deficiency anemia Recent blood loss in July. Hemoglobin levels are stable at 13. Scheduled for CBC to monitor stability. - Continue monitoring hemoglobin levels with CBC. - Follow up with hematologist as scheduled.  General Health Maintenance Annual wellness visit scheduled for the end of December or January. Labs to be drawn for routine monitoring. - Will complete annual wellness visit. - Will draw labs for routine monitoring.        Return if symptoms worsen or fail to improve.     Ginger Patrick, MSN, APRN, FNP-C [facility]        [1]  Current Outpatient Medications:    atorvastatin  (LIPITOR) 40 MG tablet, Take 1 tablet (40 mg total) by mouth at bedtime., Disp: 90 tablet, Rfl: 3   BIOFREEZE ROLL-ON 4 % GEL, Apply 1 Application topically 3 (three) times daily., Disp: , Rfl:    CALCIUM  PO, Take 1 tablet by mouth daily., Disp: , Rfl:    Cholecalciferol (VITAMIN D -3) 25 MCG (1000 UT) CAPS, Take 1,000 Units by mouth daily., Disp: , Rfl:    clopidogrel  (PLAVIX ) 75 MG tablet, Take 1 tablet (75 mg total) by mouth daily., Disp: 90 tablet, Rfl: 3   cyanocobalamin  (VITAMIN B12) 500 MCG tablet, Take 500 mcg by mouth daily., Disp: , Rfl:    diphenhydrAMINE (BENADRYL) 25 MG tablet, Take 25 mg by mouth at bedtime as needed for allergies., Disp: , Rfl:    divalproex  (DEPAKOTE ) 500 MG DR tablet, Take 1  tablet (500 mg total) by mouth at bedtime., Disp: 90 tablet, Rfl: 3   famotidine  (PEPCID  AC) 10 MG tablet, Take 10 mg by mouth 2 (two) times daily., Disp: , Rfl:    FEROSUL 325 (65 Fe) MG tablet, Take 325 mg by mouth daily., Disp: , Rfl:    furosemide  (LASIX ) 20 MG tablet, Take 1 tablet (20 mg total) by mouth daily as needed (for swelling)., Disp: , Rfl:    [START ON 11/13/2024] HYDROcodone -acetaminophen  (NORCO/VICODIN) 5-325 MG tablet, Take 1 tablet by mouth 2 (two) times daily as needed for severe pain (pain score 7-10). Must last 30 days., Disp:  60 tablet, Rfl: 0   [START ON 12/13/2024] HYDROcodone -acetaminophen  (NORCO/VICODIN) 5-325 MG tablet, Take 1 tablet by mouth 2 (two) times daily as needed for severe pain (pain score 7-10). Must last 30 days., Disp: 60 tablet, Rfl: 0   [START ON 01/12/2025] HYDROcodone -acetaminophen  (NORCO/VICODIN) 5-325 MG tablet, Take 1 tablet by mouth 2 (two) times daily as needed for severe pain (pain score 7-10). Must last 30 days., Disp: 60 tablet, Rfl: 0   hydroxychloroquine  (PLAQUENIL ) 200 MG tablet, Take 200 mg by mouth 2 (two) times daily., Disp: , Rfl:    levothyroxine  (SYNTHROID ) 137 MCG tablet, TAKE 1 TABLET BY MOUTH ONCE DAILY BEFORE BREAKFAST, Disp: 90 tablet, Rfl: 0   loratadine  (EQ ALL DAY ALLERGY RELIEF) 10 MG tablet, Take 1 tablet by mouth once daily, Disp: 30 tablet, Rfl: 5   losartan  (COZAAR ) 25 MG tablet, Take 1/2 (one-half) tablet by mouth once daily, Disp: 45 tablet, Rfl: 2   magnesium  oxide (MAG-OX) 400 (240 Mg) MG tablet, Take 400 mg by mouth daily., Disp: , Rfl:    memantine  (NAMENDA ) 10 MG tablet, Take 1 tablet (10 mg total) by mouth 2 (two) times daily. Start 1/2 tablet at night x 2 weeks then twice daily x 4 weeks and then 1 tablet twice daily as tolerated, Disp: 60 tablet, Rfl: 3   metoprolol  succinate (TOPROL -XL) 25 MG 24 hr tablet, Take 12.5 mg by mouth daily., Disp: , Rfl:    naloxone  (NARCAN ) nasal spray 4 mg/0.1 mL, Place 1 spray into the nose  as needed for up to 365 doses (for opioid-induced respiratory depresssion). In case of emergency (overdose), spray once into each nostril. If no response within 3 minutes, repeat application and call 911., Disp: 1 each, Rfl: 0   pantoprazole  (PROTONIX ) 40 MG tablet, TAKE 1 TABLET BY MOUTH TWICE A DAY, Disp: 180 tablet, Rfl: 1   polyethylene glycol (MIRALAX  / GLYCOLAX ) 17 g packet, Take 17 g by mouth daily as needed., Disp: 14 each, Rfl: 0   traZODone  (DESYREL ) 100 MG tablet, TAKE 1 TABLET BY MOUTH AT BEDTIME AS NEEDED FOR SLEEP, Disp: 90 tablet, Rfl: 0  "

## 2024-10-10 ENCOUNTER — Inpatient Hospital Stay: Attending: Oncology

## 2024-10-10 DIAGNOSIS — Z803 Family history of malignant neoplasm of breast: Secondary | ICD-10-CM | POA: Insufficient documentation

## 2024-10-10 DIAGNOSIS — D509 Iron deficiency anemia, unspecified: Secondary | ICD-10-CM | POA: Insufficient documentation

## 2024-10-10 DIAGNOSIS — Z87891 Personal history of nicotine dependence: Secondary | ICD-10-CM | POA: Insufficient documentation

## 2024-10-10 LAB — CBC WITH DIFFERENTIAL (CANCER CENTER ONLY)
Abs Immature Granulocytes: 0.09 K/uL — ABNORMAL HIGH (ref 0.00–0.07)
Basophils Absolute: 0 K/uL (ref 0.0–0.1)
Basophils Relative: 0 %
Eosinophils Absolute: 0.2 K/uL (ref 0.0–0.5)
Eosinophils Relative: 3 %
HCT: 37.5 % (ref 36.0–46.0)
Hemoglobin: 12.3 g/dL (ref 12.0–15.0)
Immature Granulocytes: 1 %
Lymphocytes Relative: 22 %
Lymphs Abs: 1.7 K/uL (ref 0.7–4.0)
MCH: 31 pg (ref 26.0–34.0)
MCHC: 32.8 g/dL (ref 30.0–36.0)
MCV: 94.5 fL (ref 80.0–100.0)
Monocytes Absolute: 0.9 K/uL (ref 0.1–1.0)
Monocytes Relative: 11 %
Neutro Abs: 4.8 K/uL (ref 1.7–7.7)
Neutrophils Relative %: 63 %
Platelet Count: 205 K/uL (ref 150–400)
RBC: 3.97 MIL/uL (ref 3.87–5.11)
RDW: 13.4 % (ref 11.5–15.5)
WBC Count: 7.7 K/uL (ref 4.0–10.5)
nRBC: 0 % (ref 0.0–0.2)

## 2024-10-10 LAB — RETIC PANEL
Immature Retic Fract: 9.7 % (ref 2.3–15.9)
RBC.: 4 MIL/uL (ref 3.87–5.11)
Retic Count, Absolute: 74 K/uL (ref 19.0–186.0)
Retic Ct Pct: 1.9 % (ref 0.4–3.1)
Reticulocyte Hemoglobin: 34.6 pg

## 2024-10-10 LAB — IRON AND TIBC
Iron: 49 ug/dL (ref 28–170)
Saturation Ratios: 16 % (ref 10.4–31.8)
TIBC: 316 ug/dL (ref 250–450)
UIBC: 267 ug/dL

## 2024-10-10 LAB — RETICULOCYTES
ABS Retic: 67040 {cells}/uL (ref 20000–80000)
Retic Ct Pct: 1.6 %

## 2024-10-10 LAB — FERRITIN: Ferritin: 39 ng/mL (ref 11–307)

## 2024-10-15 ENCOUNTER — Ambulatory Visit: Payer: Self-pay | Admitting: Family

## 2024-10-17 ENCOUNTER — Encounter: Payer: Self-pay | Admitting: Oncology

## 2024-10-17 ENCOUNTER — Inpatient Hospital Stay: Admitting: Oncology

## 2024-10-17 VITALS — BP 172/116 | HR 74 | Temp 97.8°F | Resp 18 | Wt 180.7 lb

## 2024-10-17 DIAGNOSIS — D509 Iron deficiency anemia, unspecified: Secondary | ICD-10-CM

## 2024-10-17 NOTE — Assessment & Plan Note (Signed)
 Labs are reviewed and discussed with patient. Lab Results  Component Value Date   HGB 12.3 10/10/2024   TIBC 316 10/10/2024   IRONPCTSAT 16 10/10/2024   FERRITIN 39 10/10/2024    Hemoglobin and iron  panel have both normalized. No need for iron  supplementation. I recommend patient to take multivitamin that contains iron  daily as maintenance.Carmen [name]

## 2024-10-17 NOTE — Progress Notes (Signed)
 " Hematology/Oncology Progress note [phone] [phone]           REFERRING PROVIDER: Corwin Antu, FNP   CHIEF COMPLAINTS/REASON FOR VISIT:  Evaluation of anemia    ASSESSMENT & PLAN:   Iron  deficiency anemia Labs are reviewed and discussed with patient. Lab Results  Component Value Date   HGB 12.3 10/10/2024   TIBC 316 10/10/2024   IRONPCTSAT 16 10/10/2024   FERRITIN 39 10/10/2024    Hemoglobin and iron  panel have both normalized. No need for iron  supplementation. I recommend patient to take multivitamin that contains iron  daily as maintenance..   No orders of the defined types were placed in this encounter.  Follow up as needed All questions were answered. The patient knows to call the clinic with any problems, questions or concerns.  [name], MD, PhD [facility] Hematology Oncology 10/17/2024   HISTORY OF PRESENTING ILLNESS:   Carmen [name] is a  84 y.o.  female with PMH listed below was seen in consultation at the request of  Corwin Antu, FNP  for evaluation of anemia   Discussed the use of AI scribe software for clinical note transcription with the patient, who gave verbal consent to proceed.   She was diagnosed with anemia following a fall on 04/01/2024  which resulted in a significant head injury and subsequent blood loss. Hb was 11.9 on 03/28/2024 Patient had a large laceration and a profuse bleeding, hemoglobin dropped to 7.6 necessitating a blood transfusion. Her hemoglobin has since increased to 8.8 over the past three weeks.  She started taking oral iron  supplements on 04/19/2024  after a blood test showed her hemoglobin was 8.9. She takes the iron  supplement once daily in the morning and experiences some nausea as  side effect.  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 She is currently on Plavix  and  temporarily on aspirin . No history of blood in the stool or feeling excessively tired prior to the fall.  She has a history of H. pylori infection, which was treated before March 1st, 2025. She also experiences constipation, which is managed with Miralax  and a diet rich in vegetables.   INTERVAL HISTORY Carmen [name] is a 84 y.o. female who has above history reviewed by me today presents for follow up visit for iron  deficiency anemia.  She reports feeling well. Plantar fasciitis recently seen by PCP   MEDICAL HISTORY:  Past Medical History:  Diagnosis Date   Abnormal CT scan, lumbar spine (05/11/2021) 05/17/2021   (05/11/2021) LUMBAR CT FINDINGS: Alignment: Lumbar levocurvature, apex L4. Mild lateral listhesis L4 on L5 of approximately 3 mm. Vertebrae: Remote appearing superior endplate deformities at T12 with 10% height loss and L1 with up to 20% height loss. Multilevel discogenic and facet degenerative changes. Mild bilateral SI joint arthrosis.  DISC LEVELS: T11-T12: Near complete disc height loss with de   Abnormal MRI, cervical spine (05/12/2021) 05/17/2021   (05/12/2021) CERVICAL MRI FINDINGS: Motion artifact is present. Posterior Fossa, vertebral arteries, paraspinal tissues: Left superior cerebellar infarct.   DISC LEVELS: C2-C3: Disc bulge with endplate osteophytes. Uncovertebral and facet hypertrophy. C3-C4: Disc bulge with endplate osteophytes. Uncovertebral and facet hypertrophy. Mild canal stenosis. Marked foraminal stenosis. C4-C5: Disc bulge w   Allergy    Anemia    Anxiety    Arthritis    Back pain    Coronary artery disease    Depression  History of stroke 10/02/2020   Hypertension    Peripheral vascular disease    Personal history of nicotine dependence 10/02/2020   Prsnl hx of TIA (TIA), and cereb infrc w/o resid deficits 10/02/2020   Stroke (HCC)    Thyroid  disease     SURGICAL HISTORY: Past Surgical History:  Procedure Laterality Date   back injections      CARDIAC CATHETERIZATION     CATARACT EXTRACTION     CORONARY ANGIOPLASTY     CORONARY ARTERY BYPASS GRAFT     2005   LEFT ATRIAL APPENDAGE OCCLUSION N/A 12/28/2022   Procedure: LEFT ATRIAL APPENDAGE OCCLUSION;  Surgeon: Cindie Ole DASEN, MD;  Location: [facility] INVASIVE CV LAB;  Service: Cardiovascular;  Laterality: N/A;   TEE WITHOUT CARDIOVERSION N/A 12/28/2022   Procedure: TRANSESOPHAGEAL ECHOCARDIOGRAM;  Surgeon: Cindie Ole DASEN, MD;  Location: [facility] INVASIVE CV LAB;  Service: Cardiovascular;  Laterality: N/A;    SOCIAL HISTORY: Social History   Socioeconomic History   Marital status: Widowed    Spouse name: Not on file   Number of children: 3   Years of education: Not on file   Highest education level: Not on file  Occupational History   Not on file  Tobacco Use   Smoking status: Former    Current packs/day: 0.00    Types: Cigarettes    Start date: 05/1990    Quit date: 05/2020    Years since quitting: 4.4   Smokeless tobacco: Never   Tobacco comments:    Smoked about 1 pack per month  Vaping Use   Vaping status: Never Used  Substance and Sexual Activity   Alcohol use: Not Currently   Drug use: Yes    Types: Hydrocodone    Sexual activity: Not Currently    Birth control/protection: None  Other Topics Concern   Not on file  Social History Narrative   ** Merged History Encounter **       Social Drivers of Health   Tobacco Use: Medium Risk (10/17/2024)   Patient History    Smoking Tobacco Use: Former    Smokeless Tobacco Use: Never    Passive Exposure: Not on Actuary Strain: Low Risk (08/20/2023)   Overall Financial Resource Strain (CARDIA)    Difficulty of Paying Living Expenses: Not hard at all  Food Insecurity: No Food Insecurity (04/24/2024)   Epic    Worried About Radiation Protection Practitioner of Food in the Last Year: Never true    Ran Out of Food in the Last Year: Never true  Transportation Needs: No Transportation Needs (04/24/2024)   Epic    Lack of  Transportation (Medical): No    Lack of Transportation (Non-Medical): No  Physical Activity: Insufficiently Active (08/20/2023)   Exercise Vital Sign    Days of Exercise per Week: 7 days    Minutes of Exercise per Session: 20 min  Stress: No Stress Concern Present (08/20/2023)   [employer] of Occupational Health - Occupational Stress Questionnaire    Feeling of Stress : Not at all  Social Connections: Moderately Isolated (04/02/2024)   Social Connection and Isolation Panel    Frequency of Communication with Friends and Family: More than three times a week    Frequency of Social Gatherings with Friends and Family: More than three times a week    Attends Religious Services: 1 to 4 times per year    Active Member of [employer] or Organizations: No    Attends Banker Meetings: Never  Marital Status: Widowed  Intimate Partner Violence: Not At Risk (04/24/2024)   Epic    Fear of Current or Ex-Partner: No    Emotionally Abused: No    Physically Abused: No    Sexually Abused: No  Depression (PHQ2-9): High Risk (10/09/2024)   Depression (PHQ2-9)    PHQ-2 Score: 16  Alcohol Screen: Not on file  Housing: Low Risk (04/24/2024)   Epic    Unable to Pay for Housing in the Last Year: No    Number of Times Moved in the Last Year: 0    Homeless in the Last Year: No  Utilities: Not At Risk (04/24/2024)   Epic    Threatened with loss of utilities: No  Health Literacy: Patient Unable To Answer (08/20/2023)   B1300 Health Literacy    Frequency of need for help with medical instructions: Patient unable to respond    FAMILY HISTORY: Family History  Problem Relation Age of Onset   Breast cancer Mother    Heart attack Father    Bipolar disorder Daughter    Diabetes Mellitus II Neg Hx     ALLERGIES:  has no known allergies.  MEDICATIONS:  Current Outpatient Medications  Medication Sig Dispense Refill   atorvastatin  (LIPITOR) 40 MG tablet Take 1 tablet (40 mg total) by mouth at  bedtime. 90 tablet 3   BIOFREEZE ROLL-ON 4 % GEL Apply 1 Application topically 3 (three) times daily.     CALCIUM  PO Take 1 tablet by mouth daily.     Cholecalciferol (VITAMIN D -3) 25 MCG (1000 UT) CAPS Take 1,000 Units by mouth daily.     clopidogrel  (PLAVIX ) 75 MG tablet Take 1 tablet (75 mg total) by mouth daily. 90 tablet 3   cyanocobalamin  (VITAMIN B12) 500 MCG tablet Take 500 mcg by mouth daily.     diphenhydrAMINE (BENADRYL) 25 MG tablet Take 25 mg by mouth at bedtime as needed for allergies.     divalproex  (DEPAKOTE ) 500 MG DR tablet Take 1 tablet (500 mg total) by mouth at bedtime. 90 tablet 3   famotidine  (PEPCID  AC) 10 MG tablet Take 10 mg by mouth 2 (two) times daily.     FEROSUL 325 (65 Fe) MG tablet Take 325 mg by mouth daily.     furosemide  (LASIX ) 20 MG tablet Take 1 tablet (20 mg total) by mouth daily as needed (for swelling).     [START ON 11/13/2024] HYDROcodone -acetaminophen  (NORCO/VICODIN) 5-325 MG tablet Take 1 tablet by mouth 2 (two) times daily as needed for severe pain (pain score 7-10). Must last 30 days. 60 tablet 0   [START ON 12/13/2024] HYDROcodone -acetaminophen  (NORCO/VICODIN) 5-325 MG tablet Take 1 tablet by mouth 2 (two) times daily as needed for severe pain (pain score 7-10). Must last 30 days. 60 tablet 0   [START ON 01/12/2025] HYDROcodone -acetaminophen  (NORCO/VICODIN) 5-325 MG tablet Take 1 tablet by mouth 2 (two) times daily as needed for severe pain (pain score 7-10). Must last 30 days. 60 tablet 0   hydroxychloroquine  (PLAQUENIL ) 200 MG tablet Take 200 mg by mouth 2 (two) times daily.     levothyroxine  (SYNTHROID ) 137 MCG tablet TAKE 1 TABLET BY MOUTH ONCE DAILY BEFORE BREAKFAST 90 tablet 0   loratadine  (EQ ALL DAY ALLERGY RELIEF) 10 MG tablet Take 1 tablet by mouth once daily 30 tablet 5   losartan  (COZAAR ) 25 MG tablet Take 1/2 (one-half) tablet by mouth once daily 45 tablet 2   magnesium  oxide (MAG-OX) 400 (240 Mg) MG tablet Take  400 mg by mouth daily.      memantine  (NAMENDA ) 10 MG tablet Take 1 tablet (10 mg total) by mouth 2 (two) times daily. Start 1/2 tablet at night x 2 weeks then twice daily x 4 weeks and then 1 tablet twice daily as tolerated 60 tablet 3   metoprolol  succinate (TOPROL -XL) 25 MG 24 hr tablet Take 12.5 mg by mouth daily.     naloxone  (NARCAN ) nasal spray 4 mg/0.1 mL Place 1 spray into the nose as needed for up to 365 doses (for opioid-induced respiratory depresssion). In case of emergency (overdose), spray once into each nostril. If no response within 3 minutes, repeat application and call 911. 1 each 0   pantoprazole  (PROTONIX ) 40 MG tablet TAKE 1 TABLET BY MOUTH TWICE A DAY 180 tablet 1   polyethylene glycol (MIRALAX  / GLYCOLAX ) 17 g packet Take 17 g by mouth daily as needed. 14 each 0   traZODone  (DESYREL ) 100 MG tablet TAKE 1 TABLET BY MOUTH AT BEDTIME AS NEEDED FOR SLEEP 90 tablet 0   No current facility-administered medications for this visit.    Review of Systems  Constitutional:  Positive for fatigue. Negative for appetite change, chills and fever.  HENT:   Negative for hearing loss and voice change.   Eyes:  Negative for eye problems.  Respiratory:  Negative for chest tightness and cough.   Cardiovascular:  Negative for chest pain.  Gastrointestinal:  Negative for abdominal distention, abdominal pain and blood in stool.  Endocrine: Negative for hot flashes.  Genitourinary:  Negative for difficulty urinating and frequency.   Musculoskeletal:  Negative for arthralgias.  Skin:  Negative for itching and rash.  Neurological:  Negative for extremity weakness.  Hematological:  Negative for adenopathy.  Psychiatric/Behavioral:  Negative for confusion.    PHYSICAL EXAMINATION:  Vitals:   10/17/24 1038 10/17/24 1047  BP: (!) 180/90 (!) 172/116  Pulse: 74   Resp: 18   Temp: 97.8 F (36.6 C)   SpO2: 98%    Filed Weights   10/17/24 1038  Weight: 180 lb 11.2 oz (82 kg)    Physical Exam Constitutional:       General: She is not in acute distress.    Appearance: She is obese.  HENT:     Head: Normocephalic and atraumatic.  Eyes:     General: No scleral icterus. Cardiovascular:     Rate and Rhythm: Normal rate.  Pulmonary:     Effort: Pulmonary effort is normal. No respiratory distress.     Breath sounds: Normal breath sounds. No wheezing.  Abdominal:     General: There is no distension.  Musculoskeletal:        General: Normal range of motion.     Cervical back: Normal range of motion and neck supple.  Skin:    Coloration: Skin is not pale.     Findings: No erythema or rash.  Neurological:     Mental Status: She is alert and oriented to person, place, and time. Mental status is at baseline.  Psychiatric:        Mood and Affect: Mood normal.     LABORATORY DATA:  I have reviewed the data as listed    Latest Ref Rng & Units 10/10/2024    9:09 AM 10/09/2024   12:25 PM 07/07/2024    1:28 PM  CBC  WBC 4.0 - 10.5 K/uL 7.7  5.5  4.7   Hemoglobin 12.0 - 15.0 g/dL 87.6  87.2  12.5  Hematocrit 36.0 - 46.0 % 37.5  38.1  37.9   Platelets 150 - 400 K/uL 205  225.0  198.0       Latest Ref Rng & Units 07/07/2024    1:28 PM 04/10/2024    1:28 PM 04/02/2024    5:26 AM  CMP  Glucose 70 - 99 mg/dL 876  897  97   BUN 6 - 23 mg/dL 13  19  15    Creatinine 0.40 - 1.20 mg/dL 9.18  9.29  9.17   Sodium 135 - 145 mEq/L 140  137  139   Potassium 3.5 - 5.1 mEq/L 4.1  4.1  4.5   Chloride 96 - 112 mEq/L 101  103  105   CO2 19 - 32 mEq/L 30  28  26    Calcium  8.4 - 10.5 mg/dL 9.1  8.9  8.8   Total Protein 6.0 - 8.3 g/dL 6.6   5.2   Total Bilirubin 0.2 - 1.2 mg/dL 0.3   0.5   Alkaline Phos 39 - 117 U/L 52   41   AST 0 - 37 U/L 19   21   ALT 0 - 35 U/L 17   17       RADIOGRAPHIC STUDIES: I have personally reviewed the radiological images as listed and agreed with the findings in the report. EEG adult Result Date: 07/29/2024       [facility] [address] [place]. [place] [address]  [phone]      Electroencephalogram Procedure Note Ms. Eily Hussey Date of Birth:  13-Dec-1940 Medical Record Number:  [id number] Indications: Diagnostic Date of Procedure 07/28/2024 Medications: none Clinical history : 84 year old patient being evaluated for memory loss and seizures Technical Description This study was performed using 17 channel digital electroencephalographic recording equipment. International 10-20 electrode placement was used. The record was obtained with the patient awake drowsy and asleep.  The record is of fair technical quality for purposes of interpretation. Activation Procedures:  photic stimulation . EEG Description Awake: Alpha Activity: The waking state record contains a well-defined bi-occipital alpha rhythm of  moderate amplitude with a dominant frequency of 8 Hz. Reactivity is uncertain. No paroxsymal activity, spikes, or sharp waves are noted. Technical component of study is adequate.  Slight excess jaw movements due to anxiety EKG tracing shows regular sinus rhythm Length of this recording is 27 minutes and 13 seconds Sleep: With drowsiness, there is attenuation of the background alpha activity. As the patient enters into light sleep, vertex waves and symmetrical spindles are noted. K complexes are noted in sleep. Transition to the waking state is unremarkable. Result of Activation Procedures: Hyperventilation: N/A. Photo Stimulation: No photic driving response is noted. Summary Normal electroencephalogram, awake, asleep and with activation procedures. There are no focal lateralizing or epileptiform features.         "

## 2024-10-28 ENCOUNTER — Encounter: Payer: Self-pay | Admitting: Oncology

## 2024-10-29 ENCOUNTER — Encounter: Admitting: Family

## 2024-10-29 ENCOUNTER — Ambulatory Visit

## 2024-10-29 VITALS — Ht 60.0 in | Wt 180.0 lb

## 2024-10-29 DIAGNOSIS — Z0000 Encounter for general adult medical examination without abnormal findings: Secondary | ICD-10-CM | POA: Diagnosis not present

## 2024-10-29 NOTE — Progress Notes (Signed)
 "  Please attest and cosign this visit due to patients primary care provider not being in the office at the time the visit was completed.   Chief Complaint  Patient presents with   Medicare Wellness     Subjective:   Carmen [name] is a 84 y.o. female who presents for a Medicare Annual Wellness Visit.  Visit info / Clinical Intake: Medicare Wellness Visit Type:: Subsequent Annual Wellness Visit Persons participating in visit and providing information:: caregiver (with patient present) (daughter Carmen [name]) Medicare Wellness Visit Mode:: Telephone If telephone:: video declined Since this visit was completed virtually, some vitals may be partially provided or unavailable. Missing vitals are due to the limitations of the virtual format.: Unable to obtain vitals - no equipment If Telephone or Video please confirm:: I connected with patient using audio/video enable telemedicine. I verified patient identity with two identifiers, discussed telehealth limitations, and patient agreed to proceed. Patient Location:: home Provider Location:: home office Interpreter Needed?: No Pre-visit prep was completed: yes AWV questionnaire completed by patient prior to visit?: no Living arrangements:: (!) lives alone (daughter lives right next door) Patient's Overall Health Status Rating: good Typical amount of pain: some Does pain affect daily life?: (!) yes Are you currently prescribed opioids?: (!) yes  Dietary Habits and Nutritional Risks How many meals a day?: 2 Eats fruit and vegetables daily?: yes Most meals are obtained by: preparing own meals In the last 2 weeks, have you had any of the following?: none Diabetic:: no  Functional Status Activities of Daily Living (to include ambulation/medication): (!) Needs Assist Feeding: Independent Dressing/Grooming: Independent Bathing: Independent Toileting: Independent Transfer: Independent with device- listed below Ambulation: Independent with device-  listed below Home Assistive Devices/Equipment: Walker (specify Type) Medication Administration: Dependent Is this a change from baseline?: Pre-admission baseline Home Management (perform basic housework or laundry): Dependent Manage your own finances?: (!) no Primary transportation is: family / friends Concerns about vision?: no *vision screening is required for [facility]* Concerns about hearing?: (!) yes Uses hearing aids?: (!) yes Hear whispered voice?: (!) no *in-person visit only*  Fall Screening Falls in the past year?: 1 Number of falls in past year: 1 Was there an injury with Fall?: 1 Fall Risk Category Calculator: 3 Patient Fall Risk Level: High Fall Risk  Fall Risk Patient at Risk for Falls Due to: Impaired balance/gait; Impaired mobility; Orthopedic patient Fall risk Follow up: Falls evaluation completed; Education provided; Falls prevention discussed  Home and Transportation Safety: All rugs have non-skid backing?: (!) no All stairs or steps have railings?: N/A, no stairs Grab bars in the bathtub or shower?: yes Have non-skid surface in bathtub or shower?: yes Good home lighting?: yes Regular seat belt use?: yes Hospital stays in the last year:: (!) yes How many hospital stays:: 2 Reason: fall 2dys and CVA 6ys  Cognitive Assessment Difficulty concentrating, remembering, or making decisions? : yes Will 6CIT or Mini Cog be Completed: no 6CIT or Mini Cog Declined: patient has a diagnosis of dementia or cognitive impairment  Advance Directives (For Healthcare) Does Patient Have a Medical Advance Directive?: Yes Does patient want to make changes to medical advance directive?: No - Patient declined Type of Advance Directive: Living will; Healthcare Power of Attorney Copy of Healthcare Power of Attorney in Chart?: Yes - validated most recent copy scanned in chart (See row information) Copy of Living Will in Chart?: Yes - validated most recent copy scanned in chart (See row  information) Out of facility DNR (pink MOST or  yellow form) in Chart? (Ambulatory ONLY): No - copy requested Pre-existing out of facility DNR order (yellow form or pink MOST form): Pink Most/Yellow Form available - Physician notified to receive inpatient order Would patient like information on creating a medical advance directive?: No - Patient declined  Reviewed/Updated  Reviewed/Updated: Reviewed All (Medical, Surgical, Family, Medications, Allergies, Care Teams, Patient Goals)    Allergies (verified) Patient has no known allergies.   Current Medications (verified) Outpatient Encounter Medications as of 10/29/2024  Medication Sig   atorvastatin  (LIPITOR) 40 MG tablet Take 1 tablet (40 mg total) by mouth at bedtime.   BIOFREEZE ROLL-ON 4 % GEL Apply 1 Application topically 3 (three) times daily.   CALCIUM  PO Take 1 tablet by mouth daily.   Cholecalciferol (VITAMIN D -3) 25 MCG (1000 UT) CAPS Take 1,000 Units by mouth daily.   clopidogrel  (PLAVIX ) 75 MG tablet Take 1 tablet (75 mg total) by mouth daily.   cyanocobalamin  (VITAMIN B12) 500 MCG tablet Take 500 mcg by mouth daily.   diphenhydrAMINE (BENADRYL) 25 MG tablet Take 25 mg by mouth at bedtime as needed for allergies.   divalproex  (DEPAKOTE ) 500 MG DR tablet Take 1 tablet (500 mg total) by mouth at bedtime.   famotidine  (PEPCID  AC) 10 MG tablet Take 10 mg by mouth 2 (two) times daily.   FEROSUL 325 (65 Fe) MG tablet Take 325 mg by mouth daily.   furosemide  (LASIX ) 20 MG tablet Take 1 tablet (20 mg total) by mouth daily as needed (for swelling).   [START ON 11/13/2024] HYDROcodone -acetaminophen  (NORCO/VICODIN) 5-325 MG tablet Take 1 tablet by mouth 2 (two) times daily as needed for severe pain (pain score 7-10). Must last 30 days.   [START ON 12/13/2024] HYDROcodone -acetaminophen  (NORCO/VICODIN) 5-325 MG tablet Take 1 tablet by mouth 2 (two) times daily as needed for severe pain (pain score 7-10). Must last 30 days.   [START ON  01/12/2025] HYDROcodone -acetaminophen  (NORCO/VICODIN) 5-325 MG tablet Take 1 tablet by mouth 2 (two) times daily as needed for severe pain (pain score 7-10). Must last 30 days.   hydroxychloroquine  (PLAQUENIL ) 200 MG tablet Take 200 mg by mouth 2 (two) times daily.   levothyroxine  (SYNTHROID ) 137 MCG tablet TAKE 1 TABLET BY MOUTH ONCE DAILY BEFORE BREAKFAST   loratadine  (EQ ALL DAY ALLERGY RELIEF) 10 MG tablet Take 1 tablet by mouth once daily   losartan  (COZAAR ) 25 MG tablet Take 1/2 (one-half) tablet by mouth once daily   magnesium  oxide (MAG-OX) 400 (240 Mg) MG tablet Take 400 mg by mouth daily.   memantine  (NAMENDA ) 10 MG tablet Take 1 tablet (10 mg total) by mouth 2 (two) times daily. Start 1/2 tablet at night x 2 weeks then twice daily x 4 weeks and then 1 tablet twice daily as tolerated   metoprolol  succinate (TOPROL -XL) 25 MG 24 hr tablet Take 12.5 mg by mouth daily.   naloxone  (NARCAN ) nasal spray 4 mg/0.1 mL Place 1 spray into the nose as needed for up to 365 doses (for opioid-induced respiratory depresssion). In case of emergency (overdose), spray once into each nostril. If no response within 3 minutes, repeat application and call 911.   pantoprazole  (PROTONIX ) 40 MG tablet TAKE 1 TABLET BY MOUTH TWICE A DAY   polyethylene glycol (MIRALAX  / GLYCOLAX ) 17 g packet Take 17 g by mouth daily as needed.   traZODone  (DESYREL ) 100 MG tablet TAKE 1 TABLET BY MOUTH AT BEDTIME AS NEEDED FOR SLEEP   No facility-administered encounter medications on file as  of 10/29/2024.    History: Past Medical History:  Diagnosis Date   Abnormal CT scan, lumbar spine (05/11/2021) 05/17/2021   (05/11/2021) LUMBAR CT FINDINGS: Alignment: Lumbar levocurvature, apex L4. Mild lateral listhesis L4 on L5 of approximately 3 mm. Vertebrae: Remote appearing superior endplate deformities at T12 with 10% height loss and L1 with up to 20% height loss. Multilevel discogenic and facet degenerative changes. Mild bilateral SI joint  arthrosis.  DISC LEVELS: T11-T12: Near complete disc height loss with de   Abnormal MRI, cervical spine (05/12/2021) 05/17/2021   (05/12/2021) CERVICAL MRI FINDINGS: Motion artifact is present. Posterior Fossa, vertebral arteries, paraspinal tissues: Left superior cerebellar infarct.   DISC LEVELS: C2-C3: Disc bulge with endplate osteophytes. Uncovertebral and facet hypertrophy. C3-C4: Disc bulge with endplate osteophytes. Uncovertebral and facet hypertrophy. Mild canal stenosis. Marked foraminal stenosis. 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 10/02/2020   Stroke (HCC)    Thyroid  disease    Past Surgical History:  Procedure Laterality Date   back injections     CARDIAC CATHETERIZATION     CATARACT EXTRACTION     CORONARY ANGIOPLASTY     CORONARY ARTERY BYPASS GRAFT     2005   LEFT ATRIAL APPENDAGE OCCLUSION N/A 12/28/2022   Procedure: LEFT ATRIAL APPENDAGE OCCLUSION;  Surgeon: Cindie Ole DASEN, MD;  Location: [facility] INVASIVE CV LAB;  Service: Cardiovascular;  Laterality: N/A;   TEE WITHOUT CARDIOVERSION N/A 12/28/2022   Procedure: TRANSESOPHAGEAL ECHOCARDIOGRAM;  Surgeon: Cindie Ole DASEN, MD;  Location: [facility] CV LAB;  Service: Cardiovascular;  Laterality: N/A;   Family History  Problem Relation Age of Onset   Breast cancer Mother    Heart attack Father    Bipolar disorder Daughter    Diabetes Mellitus II Neg Hx    Social History   Occupational History   Not on file  Tobacco Use   Smoking status: Former    Current packs/day: 0.00    Types: Cigarettes    Start date: 05/1990    Quit date: 05/2020    Years since quitting: 4.4   Smokeless tobacco: Never   Tobacco comments:    Smoked about 1 pack per  month  Vaping Use   Vaping status: Never Used  Substance and Sexual Activity   Alcohol use: Not Currently   Drug use: Yes    Types: Hydrocodone    Sexual activity: Not Currently    Birth control/protection: None   Tobacco Counseling Counseling given: Not Answered Tobacco comments: Smoked about 1 pack per month  SDOH Screenings   Food Insecurity: No Food Insecurity (10/29/2024)  Housing: Low Risk (10/29/2024)  Transportation Needs: No Transportation Needs (10/29/2024)  Utilities: Not At Risk (10/29/2024)  Alcohol Screen: Low Risk (10/29/2024)  Depression (PHQ2-9): Medium Risk (10/29/2024)  Financial Resource Strain: Low Risk (10/29/2024)  Physical Activity: Insufficiently Active (10/29/2024)  Social Connections: Moderately Isolated (10/29/2024)  Stress: No Stress Concern Present (10/29/2024)  Tobacco Use: Medium Risk (10/29/2024)  Health Literacy: Adequate Health Literacy (10/29/2024)   See flowsheets for full screening details  Depression Screen PHQ 2 & 9 Depression Scale- Over the past 2 weeks, how  often have you been bothered by any of the following problems? Little interest or pleasure in doing things: 2 Feeling down, depressed, or hopeless (PHQ Adolescent also includes...irritable): 2 PHQ-2 Total Score: 4 Trouble falling or staying asleep, or sleeping too much: 1 Feeling tired or having little energy: 1 Poor appetite or overeating (PHQ Adolescent also includes...weight loss): 0 Feeling bad about yourself - or that you are a failure or have let yourself or your family down: 0 Trouble concentrating on things, such as reading the newspaper or watching television (PHQ Adolescent also includes...like school work): 0 Moving or speaking so slowly that other people could have noticed. Or the opposite - being so fidgety or restless that you have been moving around a lot more than usual: 0 Thoughts that you would be better off dead, or of hurting yourself in some way: 0 PHQ-9 Total Score:  6 If you checked off any problems, how difficult have these problems made it for you to do your work, take care of things at home, or get along with other people?: Not difficult at all  Depression Treatment Depression Interventions/Treatment : Medication     Goals Addressed   None          Objective:    Today's Vitals   10/29/24 1432  Weight: 180 lb (81.6 kg)  Height: 5' (1.524 m)   Body mass index is 35.15 kg/m.  Hearing/Vision screen No results found. Immunizations and Health Maintenance Health Maintenance  Topic Date Due   Pneumococcal Vaccine: 50+ Years (1 of 2 - PCV) Never done   Zoster Vaccines- Shingrix (1 of 2) Never done   Bone Density Scan  Never done   COVID-19 Vaccine (3 - Moderna risk series) 02/03/2020   Medicare Annual Wellness (AWV)  10/29/2025   Influenza Vaccine  Completed   Meningococcal B Vaccine  Aged Out   DTaP/Tdap/Td  Discontinued        Assessment/Plan:  This is a routine wellness examination for Carmen [name].  Patient Care Team: Corwin Antu, FNP as PCP - General (Family Medicine) End, Lonni, MD as PCP - Cardiology (Cardiology) Cindie Ole DASEN, MD (Inactive) as PCP - Electrophysiology (Cardiology) End, Lonni, MD as Consulting Physician (Cardiology) Laurice Francis NOVAK, OD as Consulting Physician (Optometry) Babara Call, MD as Consulting Physician (Oncology)  I have personally reviewed and noted the following in the patients chart:   Medical and social history Use of alcohol, tobacco or illicit drugs  Current medications and supplements including opioid prescriptions. Functional ability and status Nutritional status Physical activity Advanced directives List of other physicians Hospitalizations, surgeries, and ER visits in previous 12 months Vitals Screenings to include cognitive, depression, and falls Referrals and appointments  No orders of the defined types were placed in this encounter.  In addition, I have reviewed  and discussed with patient certain preventive protocols, quality metrics, and best practice recommendations. A written personalized care plan for preventive services as well as general preventive health recommendations were provided to patient.   Erminio LITTIE Saris, LPN   [id number]    After Visit Summary: (MyChart) Due to this being a telephonic visit, the after visit summary with patients personalized plan was offered to patient via MyChart   Nurse Notes: No voiced or noted concerns at this time Patient advised to keep follow-up appointment with PCP (Feb 2026 CPE) Vaccines not given: Covid, PCV, Shingrix declined today Screenings not ordered: Declined Referral/Order for Bone Density  "

## 2024-10-29 NOTE — Patient Instructions (Signed)
 Ms. Hubbert,  Thank you for taking the time for your Medicare Wellness Visit. I appreciate your continued commitment to your health goals. Please review the care plan we discussed, and feel free to reach out if I can assist you further.  Please note that Annual Wellness Visits do not include a physical exam. Some assessments may be limited, especially if the visit was conducted virtually. If needed, we may recommend an in-person follow-up with your provider.  Ongoing Care Seeing your primary care provider every 3 to 6 months helps us  monitor your health and provide consistent, personalized care.   Referrals If a referral was made during today's visit and you haven't received any updates within two weeks, please contact the referred provider directly to check on the status.  Recommended Screenings:  Health Maintenance  Topic Date Due   Pneumococcal Vaccine for age over 24 (1 of 2 - PCV) Never done   Zoster (Shingles) Vaccine (1 of 2) Never done   Osteoporosis screening with Bone Density Scan  Never done   COVID-19 Vaccine (3 - Moderna risk series) 02/03/2020   Medicare Annual Wellness Visit  08/19/2024   Flu Shot  Completed   Meningitis B Vaccine  Aged Out   DTaP/Tdap/Td vaccine  Discontinued       10/29/2024    2:39 PM  Advanced Directives  Does Patient Have a Medical Advance Directive? Yes  Type of Advance Directive Living will;Healthcare Power of Attorney  Does patient want to make changes to medical advance directive? No - Patient declined  Copy of Healthcare Power of Attorney in Chart? Yes - validated most recent copy scanned in chart (See row information)    Vision: Annual vision screenings are recommended for early detection of glaucoma, cataracts, and diabetic retinopathy. These exams can also reveal signs of chronic conditions such as diabetes and high blood pressure.  Dental: Annual dental screenings help detect early signs of oral cancer, gum disease, and other conditions  linked to overall health, including heart disease and diabetes.  Please see the attached documents for additional preventive care recommendations.

## 2024-11-06 ENCOUNTER — Encounter: Payer: Self-pay | Admitting: Family

## 2024-11-06 ENCOUNTER — Ambulatory Visit: Admitting: Family

## 2024-11-06 VITALS — BP 130/76 | HR 74 | Temp 98.1°F | Ht 60.0 in | Wt 184.6 lb

## 2024-11-06 DIAGNOSIS — E559 Vitamin D deficiency, unspecified: Secondary | ICD-10-CM

## 2024-11-06 DIAGNOSIS — I10 Essential (primary) hypertension: Secondary | ICD-10-CM

## 2024-11-06 DIAGNOSIS — E538 Deficiency of other specified B group vitamins: Secondary | ICD-10-CM

## 2024-11-06 DIAGNOSIS — I480 Paroxysmal atrial fibrillation: Secondary | ICD-10-CM

## 2024-11-06 DIAGNOSIS — R7303 Prediabetes: Secondary | ICD-10-CM

## 2024-11-06 DIAGNOSIS — E039 Hypothyroidism, unspecified: Secondary | ICD-10-CM

## 2024-11-06 DIAGNOSIS — Z23 Encounter for immunization: Secondary | ICD-10-CM

## 2024-11-06 NOTE — Progress Notes (Signed)
 "  Subjective:  Patient ID: Carmen [name], female    DOB: July 24, 1941  Age: 84 y.o. MRN: [id number]  Patient Care Team: Corwin Antu, FNP as PCP - General (Family Medicine) End, Lonni, MD as PCP - Cardiology (Cardiology) Cindie Ole DASEN, MD (Inactive) as PCP - Electrophysiology (Cardiology) End, Lonni, MD as Consulting Physician (Cardiology) Laurice Francis NOVAK, OD as Consulting Physician (Optometry) Babara Call, MD as Consulting Physician (Oncology)   CC:  Chief Complaint  Patient presents with   Annual Exam    HPI Carmen [name] is a 84 y.o. female who presents today for an annual physical exam. She reports consuming a general diet. The patient does not participate in regular exercise at present. Does work with physical therapy She generally feels well. She reports sleeping well. She does not have additional problems to discuss today.   Vision:Within last year Dental:No regular dental care With dentures   Mammogram: declines  Colonoscopy:> 75  Bone density scan: declines   Pt is without acute concerns.   Discussed the use of AI scribe software for clinical note transcription with the patient, who gave verbal consent to proceed.  History of Present Illness Carmen [name] is an 84 year old female who presents for an annual physical exam.  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 She attends therapy sessions approximately every six weeks for insurance purposes.  She has experienced weight gain, currently weighing 184 pounds, attributed to dietary habits including snacks and chocolate, and reduced physical activity due to icy conditions. She occasionally experiences shortness of breath. She has a history of heart issues and has opted against further heart surgery. She takes her thyroid  medication separately from other medications and food, adhering to a schedule of every six days, excluding  Saturdays.  She has not seen a dentist in two years since receiving false teeth and reports that her bottom gums are shrinking. She has not had a mammogram or colonoscopy recently and has not had a bone density scan to check for osteoporosis, despite frequent falls. She has not broken any bones.  She experiences seasonal ear allergies. 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 Her last cardiology visit was in November, with a follow-up scheduled in six months.  She has not had a pneumonia shot recently. She has had shingles in the past.   Advanced Directives Patient does have advanced directives. She does have a copy in the electronic medical record.   DEPRESSION SCREENING    10/29/2024    2:36 PM 10/09/2024   12:00 PM 10/07/2024    8:23 AM 07/15/2024    9:58 AM 04/24/2024    3:17 PM 04/15/2024    1:45 PM 04/10/2024   12:39 PM  PHQ 2/9 Scores  PHQ - 2 Score 4 4 0 0 0 6 4  PHQ- 9 Score 6 16    24  16       Data saved with a previous flowsheet row definition     ROS: Negative unless specifically indicated above in HPI.   Current Medications[1]    Objective:    BP 130/76 (BP Location: Left Arm, Patient Position: Sitting, Cuff Size: Large)   Pulse 74   Temp 98.1 F (36.7 C) (Temporal)   Ht 5' (1.524 m)   Wt 184 lb 9.6 oz (83.7 kg)   SpO2 98%  BMI 36.05 kg/m   BP Readings from Last 3 Encounters:  11/06/24 130/76  10/17/24 (!) 172/116  10/09/24 136/86      Physical Exam Vitals reviewed.  Constitutional:      General: She is not in acute distress.    Appearance: Normal appearance. She is normal weight. She is not ill-appearing.  HENT:     Head: Normocephalic.     Right Ear: Tympanic membrane normal.     Left Ear: Tympanic membrane normal.     Nose: Nose normal.     Mouth/Throat:     Mouth: Mucous membranes are moist.  Eyes:     Extraocular Movements: Extraocular movements intact.      Pupils: Pupils are equal, round, and reactive to light.  Cardiovascular:     Rate and Rhythm: Normal rate and regular rhythm.  Pulmonary:     Effort: Pulmonary effort is normal.     Breath sounds: Normal breath sounds.  Musculoskeletal:        General: Normal range of motion.     Cervical back: Normal range of motion.  Skin:    General: Skin is warm.     Capillary Refill: Capillary refill takes less than 2 seconds.  Neurological:     General: No focal deficit present.     Mental Status: She is alert.  Psychiatric:        Mood and Affect: Mood normal.        Behavior: Behavior normal.        Thought Content: Thought content normal.        Judgment: Judgment normal.       Results Labs Chol (10/2024): Within normal limits A1c: Within normal limits Thyroid  function: Within normal limits  Wt Readings from Last 3 Encounters:  11/06/24 184 lb 9.6 oz (83.7 kg)  10/29/24 180 lb (81.6 kg)  10/17/24 180 lb 11.2 oz (82 kg)        Assessment & Plan:   Assessment and Plan Assessment & Plan Acquired hypothyroidism Weight gain from 171 lbs in October to 184 lbs currently, possibly related to dietary habits and reduced mobility. No symptoms of heart failure exacerbation such as increased leg swelling or shortness of breath. Thyroid  medication adherence confirmed. - Continue current thyroid  medication regimen. - Monitor weight and dietary habits. - Will consider thyroid  function tests if weight gain continues.  Essential hypertension Blood pressure was elevated during the last cardiology visit. Regular follow-up with cardiologist every three months since the fall in July. - Continue current antihypertensive regimen. - Continue to monitor blood pressure regularly. - Continue follow-up with cardiologist as scheduled.  Plantar fasciitis Chronic plantar fasciitis managed with compression stockings and supportive footwear. No new symptoms reported. - Continue use of  compression stockings and supportive footwear.  General Health Maintenance Discussion of routine health maintenance including screenings and vaccinations. Declined mammograms and colonoscopies. Discussed pneumonia vaccination due to increased risk in older adults and limited mobility. Shingles vaccination discussed but not pursued due to insurance coverage issues. - Administered pneumonia vaccination. - Discussed shingles vaccination at pharmacy if desired.  Patient Counseling(The following topics were reviewed):  Preventative care handout given to pt  Health maintenance and immunizations reviewed. Please refer to Health maintenance section. Pt advised on safe sex, wearing seatbelts in car, and proper nutrition labwork ordered today for annual Dental health: Discussed importance of regular tooth brushing, flossing, and dental visits.      Follow-up: Return in about 6 months (around 05/06/2025) for f/u  hypothyroid .   Ginger Patrick, FNP      [1]  Current Outpatient Medications:    atorvastatin  (LIPITOR) 40 MG tablet, Take 1 tablet (40 mg total) by mouth at bedtime., Disp: 90 tablet, Rfl: 3   BIOFREEZE ROLL-ON 4 % GEL, Apply 1 Application topically 3 (three) times daily., Disp: , Rfl:    CALCIUM  PO, Take 1 tablet by mouth daily., Disp: , Rfl:    Cholecalciferol (VITAMIN D -3) 25 MCG (1000 UT) CAPS, Take 1,000 Units by mouth daily., Disp: , Rfl:    clopidogrel  (PLAVIX ) 75 MG tablet, Take 1 tablet (75 mg total) by mouth daily., Disp: 90 tablet, Rfl: 3   cyanocobalamin  (VITAMIN B12) 500 MCG tablet, Take 500 mcg by mouth daily., Disp: , Rfl:    diphenhydrAMINE (BENADRYL) 25 MG tablet, Take 25 mg by mouth at bedtime as needed for allergies., Disp: , Rfl:    divalproex  (DEPAKOTE ) 500 MG DR tablet, Take 1 tablet (500 mg total) by mouth at bedtime., Disp: 90 tablet, Rfl: 3   famotidine  (PEPCID  AC) 10 MG tablet, Take 10 mg by mouth 2 (two) times daily., Disp: , Rfl:    FEROSUL 325 (65 Fe) MG  tablet, Take 325 mg by mouth daily., Disp: , Rfl:    furosemide  (LASIX ) 20 MG tablet, Take 1 tablet (20 mg total) by mouth daily as needed (for swelling)., Disp: , Rfl:    [START ON 11/13/2024] HYDROcodone -acetaminophen  (NORCO/VICODIN) 5-325 MG tablet, Take 1 tablet by mouth 2 (two) times daily as needed for severe pain (pain score 7-10). Must last 30 days., Disp: 60 tablet, Rfl: 0   [START ON 12/13/2024] HYDROcodone -acetaminophen  (NORCO/VICODIN) 5-325 MG tablet, Take 1 tablet by mouth 2 (two) times daily as needed for severe pain (pain score 7-10). Must last 30 days., Disp: 60 tablet, Rfl: 0   [START ON 01/12/2025] HYDROcodone -acetaminophen  (NORCO/VICODIN) 5-325 MG tablet, Take 1 tablet by mouth 2 (two) times daily as needed for severe pain (pain score 7-10). Must last 30 days., Disp: 60 tablet, Rfl: 0   hydroxychloroquine  (PLAQUENIL ) 200 MG tablet, Take 200 mg by mouth 2 (two) times daily., Disp: , Rfl:    levothyroxine  (SYNTHROID ) 137 MCG tablet, TAKE 1 TABLET BY MOUTH ONCE DAILY BEFORE BREAKFAST, Disp: 90 tablet, Rfl: 0   loratadine  (EQ ALL DAY ALLERGY RELIEF) 10 MG tablet, Take 1 tablet by mouth once daily, Disp: 30 tablet, Rfl: 5   losartan  (COZAAR ) 25 MG tablet, Take 1/2 (one-half) tablet by mouth once daily, Disp: 45 tablet, Rfl: 2   magnesium  oxide (MAG-OX) 400 (240 Mg) MG tablet, Take 400 mg by mouth daily., Disp: , Rfl:    memantine  (NAMENDA ) 10 MG tablet, Take 1 tablet (10 mg total) by mouth 2 (two) times daily. Start 1/2 tablet at night x 2 weeks then twice daily x 4 weeks and then 1 tablet twice daily as tolerated, Disp: 60 tablet, Rfl: 3   metoprolol  succinate (TOPROL -XL) 25 MG 24 hr tablet, Take 12.5 mg by mouth daily., Disp: , Rfl:    naloxone  (NARCAN ) nasal spray 4 mg/0.1 mL, Place 1 spray into the nose as needed for up to 365 doses (for opioid-induced respiratory depresssion). In case of emergency (overdose), spray once into each nostril. If no response within 3 minutes, repeat  application and call 911., Disp: 1 each, Rfl: 0   pantoprazole  (PROTONIX ) 40 MG tablet, TAKE 1 TABLET BY MOUTH TWICE A DAY, Disp: 180 tablet, Rfl: 1   polyethylene glycol (MIRALAX  / GLYCOLAX ) 17  g packet, Take 17 g by mouth daily as needed., Disp: 14 each, Rfl: 0   traZODone  (DESYREL ) 100 MG tablet, TAKE 1 TABLET BY MOUTH AT BEDTIME AS NEEDED FOR SLEEP, Disp: 90 tablet, Rfl: 0  "

## 2025-01-01 ENCOUNTER — Encounter: Admitting: Nurse Practitioner

## 2025-02-02 ENCOUNTER — Ambulatory Visit: Admitting: Adult Health
